# Patient Record
Sex: Female | Born: 1935
Health system: Southern US, Community
[De-identification: ages and names within clinical notes are randomized; demographics above are authoritative.]

## PROBLEM LIST (undated history)

## (undated) DIAGNOSIS — I2510 Atherosclerotic heart disease of native coronary artery without angina pectoris: Secondary | ICD-10-CM

## (undated) DIAGNOSIS — E119 Type 2 diabetes mellitus without complications: Secondary | ICD-10-CM

## (undated) DIAGNOSIS — I10 Essential (primary) hypertension: Secondary | ICD-10-CM

## (undated) DIAGNOSIS — K5730 Diverticulosis of large intestine without perforation or abscess without bleeding: Secondary | ICD-10-CM

## (undated) DIAGNOSIS — K639 Disease of intestine, unspecified: Secondary | ICD-10-CM

## (undated) DIAGNOSIS — K219 Gastro-esophageal reflux disease without esophagitis: Secondary | ICD-10-CM

## (undated) DIAGNOSIS — E785 Hyperlipidemia, unspecified: Secondary | ICD-10-CM

## (undated) DIAGNOSIS — D509 Iron deficiency anemia, unspecified: Secondary | ICD-10-CM

## (undated) DIAGNOSIS — IMO0001 Reserved for inherently not codable concepts without codable children: Secondary | ICD-10-CM

## (undated) DIAGNOSIS — F419 Anxiety disorder, unspecified: Secondary | ICD-10-CM

## (undated) DIAGNOSIS — Z8673 Personal history of transient ischemic attack (TIA), and cerebral infarction without residual deficits: Secondary | ICD-10-CM

## (undated) DIAGNOSIS — E669 Obesity, unspecified: Secondary | ICD-10-CM

## (undated) DIAGNOSIS — I639 Cerebral infarction, unspecified: Secondary | ICD-10-CM

## (undated) DIAGNOSIS — K449 Diaphragmatic hernia without obstruction or gangrene: Secondary | ICD-10-CM

## (undated) DIAGNOSIS — M1990 Unspecified osteoarthritis, unspecified site: Secondary | ICD-10-CM

## (undated) DIAGNOSIS — G3184 Mild cognitive impairment, so stated: Secondary | ICD-10-CM

## (undated) DIAGNOSIS — I350 Nonrheumatic aortic (valve) stenosis: Secondary | ICD-10-CM

## (undated) DIAGNOSIS — J189 Pneumonia, unspecified organism: Secondary | ICD-10-CM

## (undated) HISTORY — DX: Atherosclerotic heart disease of native coronary artery without angina pectoris: I25.10

## (undated) HISTORY — DX: Diaphragmatic hernia without obstruction or gangrene: K44.9

## (undated) HISTORY — DX: Type 2 diabetes mellitus without complications: E11.9

## (undated) HISTORY — DX: Iron deficiency anemia, unspecified: D50.9

## (undated) HISTORY — DX: Nonrheumatic aortic (valve) stenosis: I35.0

## (undated) HISTORY — DX: Gastro-esophageal reflux disease without esophagitis: K21.9

## (undated) HISTORY — PX: INSERT / REPLACE / REMOVE PACEMAKER: SUR710

## (undated) HISTORY — DX: Disease of intestine, unspecified: K63.9

## (undated) HISTORY — PX: BREAST LUMPECTOMY: SHX2

## (undated) HISTORY — DX: Obesity, unspecified: E66.9

## (undated) HISTORY — PX: HIP ARTHROPLASTY: SHX981

## (undated) HISTORY — DX: Anxiety disorder, unspecified: F41.9

## (undated) HISTORY — DX: Unspecified osteoarthritis, unspecified site: M19.90

## (undated) HISTORY — DX: Reserved for inherently not codable concepts without codable children: IMO0001

## (undated) HISTORY — DX: Diverticulosis of large intestine without perforation or abscess without bleeding: K57.30

## (undated) HISTORY — PX: TONSILLECTOMY: SUR1361

## (undated) HISTORY — DX: Hyperlipidemia, unspecified: E78.5

---
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
## 1993-09-18 HISTORY — PX: TOTAL HIP ARTHROPLASTY: SHX124

## 1999-09-19 HISTORY — PX: ANTERIOR CERVICAL DISCECTOMY: SHX1160

## 2000-08-29 ENCOUNTER — Ambulatory Visit (HOSPITAL_COMMUNITY): Admission: RE | Admit: 2000-08-29 | Discharge: 2000-08-29 | Payer: Self-pay | Admitting: Neurological Surgery

## 2000-08-29 ENCOUNTER — Encounter: Payer: Self-pay | Admitting: Neurological Surgery

## 2000-09-03 ENCOUNTER — Encounter: Payer: Self-pay | Admitting: Neurological Surgery

## 2000-09-04 ENCOUNTER — Inpatient Hospital Stay (HOSPITAL_COMMUNITY): Admission: RE | Admit: 2000-09-04 | Discharge: 2000-09-05 | Payer: Self-pay | Admitting: Neurological Surgery

## 2000-09-04 ENCOUNTER — Encounter: Payer: Self-pay | Admitting: Neurological Surgery

## 2000-09-27 ENCOUNTER — Encounter: Admission: RE | Admit: 2000-09-27 | Discharge: 2000-09-27 | Payer: Self-pay | Admitting: Neurological Surgery

## 2000-09-27 ENCOUNTER — Encounter: Payer: Self-pay | Admitting: Neurological Surgery

## 2001-01-24 ENCOUNTER — Ambulatory Visit (HOSPITAL_COMMUNITY): Admission: RE | Admit: 2001-01-24 | Discharge: 2001-01-24 | Payer: Self-pay | Admitting: Family Medicine

## 2001-01-24 ENCOUNTER — Encounter: Payer: Self-pay | Admitting: Family Medicine

## 2002-08-08 ENCOUNTER — Encounter: Payer: Self-pay | Admitting: Family Medicine

## 2002-08-08 ENCOUNTER — Ambulatory Visit (HOSPITAL_COMMUNITY): Admission: RE | Admit: 2002-08-08 | Discharge: 2002-08-08 | Payer: Self-pay | Admitting: Family Medicine

## 2002-08-15 ENCOUNTER — Encounter: Payer: Self-pay | Admitting: Family Medicine

## 2002-08-15 ENCOUNTER — Ambulatory Visit (HOSPITAL_COMMUNITY): Admission: RE | Admit: 2002-08-15 | Discharge: 2002-08-15 | Payer: Self-pay | Admitting: Family Medicine

## 2003-05-29 ENCOUNTER — Encounter: Payer: Self-pay | Admitting: Family Medicine

## 2003-05-29 ENCOUNTER — Ambulatory Visit (HOSPITAL_COMMUNITY): Admission: RE | Admit: 2003-05-29 | Discharge: 2003-05-29 | Payer: Self-pay | Admitting: Family Medicine

## 2003-08-03 ENCOUNTER — Other Ambulatory Visit: Admission: RE | Admit: 2003-08-03 | Discharge: 2003-08-03 | Payer: Self-pay | Admitting: Dermatology

## 2003-11-23 ENCOUNTER — Ambulatory Visit (HOSPITAL_COMMUNITY): Admission: RE | Admit: 2003-11-23 | Discharge: 2003-11-23 | Payer: Self-pay | Admitting: Urology

## 2004-04-28 ENCOUNTER — Ambulatory Visit (HOSPITAL_COMMUNITY): Admission: RE | Admit: 2004-04-28 | Discharge: 2004-04-28 | Payer: Self-pay | Admitting: Family Medicine

## 2004-05-12 ENCOUNTER — Inpatient Hospital Stay (HOSPITAL_BASED_OUTPATIENT_CLINIC_OR_DEPARTMENT_OTHER): Admission: RE | Admit: 2004-05-12 | Discharge: 2004-05-12 | Payer: Self-pay | Admitting: Cardiology

## 2004-09-18 HISTORY — PX: COLONOSCOPY: SHX174

## 2004-11-18 ENCOUNTER — Ambulatory Visit (HOSPITAL_COMMUNITY): Admission: RE | Admit: 2004-11-18 | Discharge: 2004-11-18 | Payer: Self-pay | Admitting: General Surgery

## 2005-09-18 HISTORY — PX: CATARACT EXTRACTION W/ INTRAOCULAR LENS IMPLANT: SHX1309

## 2006-06-14 ENCOUNTER — Ambulatory Visit (HOSPITAL_COMMUNITY): Admission: RE | Admit: 2006-06-14 | Discharge: 2006-06-14 | Payer: Self-pay | Admitting: Ophthalmology

## 2006-07-25 ENCOUNTER — Ambulatory Visit (HOSPITAL_COMMUNITY): Admission: RE | Admit: 2006-07-25 | Discharge: 2006-07-25 | Payer: Self-pay | Admitting: Ophthalmology

## 2008-09-17 ENCOUNTER — Ambulatory Visit: Payer: Self-pay | Admitting: Cardiology

## 2008-09-24 ENCOUNTER — Ambulatory Visit: Payer: Self-pay | Admitting: Cardiology

## 2008-09-24 ENCOUNTER — Encounter: Payer: Self-pay | Admitting: Cardiology

## 2008-09-24 ENCOUNTER — Ambulatory Visit (HOSPITAL_COMMUNITY): Admission: RE | Admit: 2008-09-24 | Discharge: 2008-09-24 | Payer: Self-pay | Admitting: Cardiology

## 2009-09-07 ENCOUNTER — Ambulatory Visit (HOSPITAL_COMMUNITY): Admission: RE | Admit: 2009-09-07 | Discharge: 2009-09-07 | Payer: Self-pay | Admitting: Family Medicine

## 2009-10-06 ENCOUNTER — Ambulatory Visit (HOSPITAL_COMMUNITY): Admission: RE | Admit: 2009-10-06 | Discharge: 2009-10-06 | Payer: Self-pay | Admitting: Family Medicine

## 2009-11-29 ENCOUNTER — Ambulatory Visit (HOSPITAL_COMMUNITY): Admission: RE | Admit: 2009-11-29 | Discharge: 2009-11-29 | Payer: Self-pay | Admitting: Family Medicine

## 2009-12-29 ENCOUNTER — Ambulatory Visit (HOSPITAL_COMMUNITY): Admission: RE | Admit: 2009-12-29 | Discharge: 2009-12-29 | Payer: Self-pay | Admitting: Family Medicine

## 2010-10-09 ENCOUNTER — Encounter: Payer: Self-pay | Admitting: Family Medicine

## 2011-01-31 NOTE — Letter (Signed)
September 17, 2008    Denise Parrish. Sudie Bailey, MD  [address]  [place], [place] [address]   RE:  Denise Parrish, Denise Parrish  MRN:  [id number]  /  DOB:  1923/06/26   Dear Brett Canales,   It was my pleasure evaluating Denise Parrish in the office today in  consultation at the request for chest discomfort.  As you know, this  nice woman has enjoyed generally good health.  She does have a number of  cardiovascular risk factors including diabetes, hypertension,  hyperlipidemia, but all have been well controlled with fairly modest  medication.  Over the past few weeks, she has noted chest discomfort and  palpitations.  She relates this to episodes of stress, which have  involved visiting family over the holidays.  She has trouble  characterizing her chest discomfort.  It is substernal and somewhat  annoying to her.  She was more troubled by regular strong palpitations  that she typically feels at night when she retires.  There has been no  dyspnea, lightheadedness, or syncope.  She did experience discomfort  with exertion or at rest.  There is no radiation of the discomfort.  There are no associated symptoms.   PAST MEDICAL HISTORY:  Otherwise notable for surgical procedures, a  surgical spine operation approximately 10 years ago and a total right  hip arthroplasty 14 years ago.   ALLERGIES:  She has no known drug allergies.   CURRENT MEDICATIONS:  1. Simvastatin 40 mg daily.  2. Aspirin 81 mg daily.  3. Metformin 1000 mg b.i.d.  4. Omeprazole 20 mg daily.  5. Calcium 500 mg daily.  6. Metoprolol 25 mg b.i.d.   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  She is divorced with 4 adult  children.   FAMILY HISTORY:  Father died with congestive heart failure and mother at  an advanced age due to cirrhosis.  Of three siblings, 2 were deceased  due to neoplastic disease.  One is alive and well.   REVIEW OF SYSTEMS:  Notable for the  need for corrective lenses,  bilateral cataract procedures, decreased hearing acuity, upper and lower  dentures, GERD symptoms, urinary frequency, diffuse arthritic  discomfort.  All other systems reviewed and are negative.   PHYSICAL EXAMINATION:  GENERAL:  A pleasant older woman in no acute  distress.  VITAL SIGNS:  The weight is 185, blood pressure 130/70, heart rate 65  and regular, and respirations 14.  NECK:  No jugular venous distention; normal carotid upstrokes without  bruits.  HEENT:  EOMs full; normal lids and conjunctivae; normal oral mucosa.  ENDOCRINE:  No thyromegaly.  HEMATOPOIETIC:  No adenopathy.  LUNGS:  Clear.  CARDIAC:  Distant first and second heart sounds.  ABDOMEN:  Soft and nontender; normal bowel sounds; no masses; no  organomegaly.  EXTREMITIES:  No edema; distal pulses intact.  NEUROLOGIC:  Symmetric strength and tone; normal cranial nerves.   EKG:  Normal sinus rhythm; borderline left atrial abnormality; otherwise  unremarkable.   Recent laboratories include a normal chemistry profile, normal hepatic  profile, normal CBC, hemoglobin A1c of 7.1 and a low vitamin D level of  36.   IMPRESSION:  Denise Parrish has somewhat atypical chest discomfort and  palpitations related to anxiety.  With minimal coronary artery disease 4  years ago, it is unlikely that she has developed significant coronary  artery disease with angina.  We will proceed with a stress  echocardiogram to rule out that possibility.  I suspect her chest  discomfort will resolve once her stress level has returned to baseline.  Please send her back to me if it does not.    Sincerely,     Gerrit Friends. Dietrich Pates, MD, [facility]  Electronically Signed   RMR/MedQ  DD: 09/17/2008  DT: 09/18/2008  Job #: [id number]

## 2011-02-03 NOTE — Op Note (Signed)
Denise Parrish, Denise Parrish            ACCOUNT NO.:  [id number]   MEDICAL RECORD NO.:  [id number]          PATIENT TYPE:  AMB   LOCATION:  DAY                           FACILITY:  [facility]   PHYSICIAN:  Susanne Greenhouse, MD       DATE OF BIRTH:  Jul 30, 1923   DATE OF PROCEDURE:  06/14/2006  DATE OF DISCHARGE:                                 OPERATIVE REPORT   PREOPERATIVE DIAGNOSIS:  Nuclear and cortical cataract, left eye.   POSTOPERATIVE DIAGNOSIS:  Nuclear and cortical cataract, left eye.   OPERATION/PROCEDURE:  1. Phacoemulsification.  2. Intraocular lens implantation, left eye.   SURGEON:  Susanne Greenhouse, M.D.   ANESTHESIA:  Topical with monitored anesthesia care.   DESCRIPTION OF PROCEDURE:  In the preoperative area, dilating drops and 2%  viscous lidocaine were placed into the left eye.  The patient was then  brought to the operating room where the eye was prepped and draped.  The  super sharp blade was used to make a paracentesis port at the surgeon's 2  o'clock position. The anterior chamber was filled with 1% nonpreserved  lidocaine solution and followed by Amvisc Plus.  A 2.85 mm keratome blade  was used to make a clear corneal incision at the superotemporal limbus.  A  cystitome needle and Utrata forceps was used to create a continuous tear  capsulotomy.  Hydrodissection was performed using balanced salt solution and  a fine cannula.  The lens nucleus was then removed using phacoemulsification  with a quadrant cracking technique.  Residual cortex was removed with  irrigation and aspiration.  Capsular bag and anterior chamber were refilled  with Amvisc Plus.  A posterior chamber intraocular lens was placed into the  capsular bag using a Monarch lens injecting system.  The Amvisc Plus was  then removed from the capsular bag and anterior chamber with irrigation and  aspiration.  Stromal hydration of the main incision and paracentesis ports  was performed with balanced salt solution  and a fine cannula.  The wound was  tested for a leak which was negative.  No sutures were placed.  There were  no operative complications.  The patient tolerated the procedure well and  was returned to the recovery area in satisfactory condition.  Prosthetic  device used was Alcon AcrySof posterior chamber intraocular lens model  SM60WF, power of 23, serial [id number].           ______________________________  Susanne Greenhouse, MD     KEH/MEDQ  D:  06/14/2006  T:  06/16/2006  Job:  [id number]

## 2011-02-03 NOTE — Op Note (Signed)
[place]  Patient:    Denise, Parrish                      MRN: [id number] Proc. Date: 09/04/00 Adm. Date:  [id number] Attending:  Jonne Ply                           Operative Report  PREOPERATIVE DIAGNOSIS:  C4-5 cervical spondylosis with right cervical radiculopathy.  POSTOPERATIVE DIAGNOSIS:  C4-5 cervical spondylosis with right cervical radiculopathy.  PROCEDURE:  Anterior cervical diskectomy and arthrodesis, C4-5, structural allograft, Synthes fixation.  SURGEON:  Stefani Dama, M.D.  FIRST ASSISTANT:  Cristi Loron, M.D.  ANESTHESIA:  General endotracheal.  INDICATIONS:  The patient is a 77 year old individual who has had significant right shoulder and right arm pain.  She had severe spondylosis with foraminal stenosis bilaterally at C4-5.  DESCRIPTION OF PROCEDURE:  The patient was brought to the operating room and placed on the table in the supine position.  After smooth induction of general endotracheal anesthesia, she was placed in five pounds of Holter traction. The neck was prepped with Duraprep and draped in a sterile fashion.  A transverse incision was made in the neck and carried down through the platysma.  The plane between the sternocleidomastoid and the strap muscles was dissected bluntly until the prevertebral space was reached.  The first identifiable disk space was noted to be that of C4-5.  After dissecting the longus colli muscle, a Caspar retractor was placed underneath the muscle, and the disk space was exposed.  A combination of curettes and rongeurs was used to remove a significant quantity of the markedly degenerated disk material from within the disk space.  Ventral osteophytes were removed with a Leksell rongeur.  As the disk space was evacuated, self-retaining disk spreader was placed on one side, and then the uncinate process spurs, which were noted to be quite big and overgrown, were  taken down with an Anspach and a 2.3 mm match-stick dissector.  The lateral recess was cleared first on the right side.  The common dural tube and the nerve root was decompressed well.  The left side was similarly decompressed.  Hemostasis from the epidural veins was obtained with bipolar cautery and small pledgets of Gelfoam soaked in thrombin, which were later removed.  Once dissection was completed, a 7 mm round fibular graft was packed with some of the osteophytic bone that was removed, and then this was placed into the interspace at C4-5 level.  Traction was removed.  A 16 mm Synthes plate was affixed with four locking 4 x 14 mm screws.  A radiograph for confirmation of placement of the plate and the bone graft was obtained.  The area was copiously irrigated with antibiotic irrigant solution, and then the platysma was closed with 3-0 Vicryl in interrupted fashion, and 3-0 Vicryl was used to close the skin subcuticularly.  The patient tolerated the procedure well.  Blood loss was less than 100 cc. DD:  09/04/00 TD:  09/05/00 Job: [id number]

## 2011-02-03 NOTE — Cardiovascular Report (Signed)
NAME:  Denise Parrish, Denise Parrish                      ACCOUNT NO.:  [id number]   MEDICAL RECORD NO.:  [id number]                   PATIENT TYPE:  OIB   LOCATION:  6501                                 FACILITY:  [facility]   PHYSICIAN:  Vida Roller, M.D.                DATE OF BIRTH:  03-01-1923   DATE OF PROCEDURE:  05/12/2004  DATE OF DISCHARGE:                              CARDIAC CATHETERIZATION   HISTORY OF PRESENT ILLNESS:  Ms. Brien is a 77 year old woman who is  preop for a hip replacement, who had a perfusion study which showed question  of balance ischemia with normal LV function, and she was referred for  elective heart catheterization.   DETAILS OF THE PROCEDURE:  After obtaining informed consent, the patient was  brought to the cardiac catheterization laboratory in the fasting state.  There she was prepped and draped in the usual sterile manner and a local  anesthetic was obtained over the right groin using 1% lidocaine without  epinephrine.  The right femoral artery was cannulated using the modified  Seldinger technique with a 5 French 10 cm sheath, and left heart  catheterization was performed using a 5 French Judkins left #4, a 5 French  Judkins right #4, and a 5 French pigtail catheter.  The pigtail catheter was  used for left ventriculography, which was imaged in the 30 degree RAO view.  At the conclusion of the procedure the catheters were removed, the patient  was moved back to the holding area, where the right femoral artery was  removed.  Hemostasis was obtained using direct manual pressure.  At the  conclusion of the hold there was no evidence of ecchymosis or hematoma  formation, and distal pulses were intact.  Total fluoroscopic time was 6.4  minutes.  Total iodinized contrast was 100 mL.   RESULTS:  1. Left ventricular pressure 121/16, end-diastolic pressure 27 mmHg.  2. Aortic pressure 130/67 with a mean of 91 mmHg.   SELECTIVE CORONARY ANGIOGRAPHY:  1. The  right coronary artery is a moderate-caliber, dominant vessel, which     has luminal irregularities.  The posterior descending is a small vessel     with luminal irregularities.  There was no significant obstruction of the     right coronary system.  2. The left main coronary artery is a large-caliber vessel which is     angiographically normal.  3. The left anterior descending coronary artery is a large-caliber vessel     with luminal irregularities in its proximal portion.  There are two     diagonals, which are of moderate caliber.  The entire LAD circulation is     free of obstructive disease.  4. Left circumflex coronary artery is a moderate-caliber vessel with luminal     irregularities in its proximal portion.  There are two very small obtuse     marginals, a large posterolateral branch, all of which are without  significant obstructive disease.  There was some tortuosity in the left     circumflex coronary artery, consistent with the patient's age.   Left ventriculography reveals normal left ventricular systolic function,  estimated at 65%, no wall motion abnormalities, 1+ MR.   ASSESSMENT:  Nonobstructive coronary disease, normal left ventricular  systolic function.   PLAN:  Send the lady to surgery without any further cardiovascular workup,  and she will need aggressive risk factor modification.                                               Vida Roller, M.D.    JH/MEDQ  D:  05/12/2004  T:  05/13/2004  Job:  [id number]   cc:   Mila Homer. Sudie Bailey, M.D.  [address] [place], [place] [address]  Fax: [phone]   Learta Codding, M.D. [facility]

## 2011-02-03 NOTE — H&P (Signed)
NAME:  Denise Parrish, Denise Parrish            ACCOUNT NO.:  [id number]   MEDICAL RECORD NO.:  [id number]          PATIENT TYPE:  AMB   LOCATION:                                 FACILITY:   PHYSICIAN:  Dalia Heading, M.D.  DATE OF BIRTH:  [birth year]   DATE OF ADMISSION:  DATE OF DISCHARGE:  LH                                HISTORY & PHYSICAL   CHIEF COMPLAINT:  Need for screening colonoscopy.   HISTORY OF PRESENT ILLNESS:  The patient is a 77 year old white female who  was referred for endoscopic evaluation.  She needs colonoscopy for screening  purposes.  No abdominal pain, weight loss, nausea, vomiting, diarrhea, constipation,  melena, or hematochezia have been noted.  She has never had a colonoscopy.  There is no family history of colon carcinoma.   PAST MEDICAL HISTORY:  For the most part, unremarkable.   PAST SURGICAL HISTORY:  1.  Hip replacement.  2.  Breast biopsy.  3.  Tonsillectomy.   CURRENT MEDICATIONS:  None.   ALLERGIES:  NO KNOWN DRUG ALLERGIES.   REVIEW OF SYSTEMS:  Noncontributory.   PHYSICAL EXAMINATION:  GENERAL:  The patient is a well-developed, well-  nourished white female in no acute distress.  VITAL SIGNS:  She is afebrile, and vital signs are stable.  LUNGS:  Clear to auscultation with equal breath sounds bilaterally.  HEART:  Examination reveals a regular rate and rhythm without S3, S4, or  murmurs.  ABDOMEN:  Soft, nontender, nondistended.  No hepatosplenomegaly or masses  noted.  RECTAL:  Deferred to the procedure.   IMPRESSION:  Need for screening colonoscopy.   PLAN:  The patient is scheduled for colonoscopy on November 18, 2004.  The risks  and benefits of the procedure, including bleeding and perforation, were  fully discussed with the patient who gave informed consent.      MAJ/MEDQ  D:  11/08/2004  T:  11/08/2004  Job:  [id number]   cc:   Mila Homer. Sudie Bailey, M.D.  [address] [place], [place] [address]  Fax: [phone]

## 2011-02-03 NOTE — Procedures (Signed)
NAMEAUDRYANNA, Denise Parrish                      ACCOUNT NO.:  [id number]   MEDICAL RECORD NO.:  [id number]                   PATIENT TYPE:  OUT   LOCATION:  RAD                                  FACILITY:  [facility]   PHYSICIAN:  [place]iance Bing, M.D.               DATE OF BIRTH:  Aug 11, 1923   DATE OF PROCEDURE:  04/28/2004  DATE OF DISCHARGE:                                  ECHOCARDIOGRAM   REFERRING PHYSICIAN:  1. Dr. Renard Matter.  2. Dr. Sudie Bailey.   CLINICAL DATA:  A 77 year old woman with irregular cardiac rhythm;  hip  replacement surgery anticipated.   M-mode:  Aorta 2.5, left atrium 3.9, septum 1.3, posterior wall 1.3, LV  diastole 4, LV systole 2.6.   RESULTS:  1. Technically somewhat difficult, but adequate echocardiographic study.  2. Mild left atrial enlargement;  normal right atrial size.  3. Normal right ventricular size and function;  mild RVH.  4. Aortic valve not ideally imaged;  the valve is sclerotic;  calcification     present in the wall of the proximal ascending aorta.  By Doppler, there     is trivial, in any, stenosis, and no insufficiency.  5. Normal tricuspid valve.  6. Normal mitral valve except for mild to moderate annular calcification.  7. Pulmonic valve not well seen, probably normal.  8. Normal internal dimension of the left ventricle;  mild concentric left     ventricular hypertrophy.  Regional global function are hyperdynamic.  9. Exuberate pericardial fat anterior to the heart.      ___________________________________________                                            [place] Bing, M.D.   RR/MEDQ  D:  04/29/2004  T:  04/29/2004  Job:  [id number]

## 2011-02-03 NOTE — Procedures (Signed)
NAMEGILBERTO, Denise Parrish            ACCOUNT NO.:  [id number]   MEDICAL RECORD NO.:  [id number]          PATIENT TYPE:  OUT   LOCATION:  RAD                           FACILITY:  [facility]   PHYSICIAN:  Jonelle Sidle, MD DATE OF BIRTH:  1923-05-31   DATE OF PROCEDURE:  DATE OF DISCHARGE:                                ECHOCARDIOGRAM   REQUESTING PHYSICIAN:  Gerrit Friends. Dietrich Pates, MD, [facility]   INDICATIONS:  Ms. Rossano is an 77 year old woman with a history of  type 2 diabetes mellitus, hypertension, and hyperlipidemia.  She has had  atypical chest discomfort as well as palpitations and is referred for  the assessment of ischemia.   RESTING DATA:  Blood pressure 132/78, heart rate 81 beats per minute.  12-lead electrocardiogram shows normal sinus rhythm at 71 beats per  minute with nonspecific T-wave changes.  The resting echocardiographic  images show vigorous baseline left ventricular systolic function at 70-  75% with small left ventricular chamber size.   STRESS DATA:  The patient was exercised on a standard Bruce protocol for  3 minutes and 45 seconds achieving a maximum heart rate of 135 beats per  minute which was 99% of the maximum predicted heart rate.  The patient  experienced shortness of breath and fatigue with exercise, but no chest  pain.  There were no clearly diagnostic ST-segment changes noted by  standard criteria and no sustained arrhythmias were noted.  Peak blood  pressure was 182/78 in early recovery.  Maximum workload was 4.6 METs.  The echocardiographic data following achievement of maximum predicted  heart rate reveals hyperdynamic left ventricular systolic function with  near cavity obliteration and no obvious inducible wall motion  abnormalities to indicate focal ischemia.   IMPRESSION:  1. No diagnostic ST-segment changes noted at a maximum workload of 4.6      METs and in the absence of chest pain.  There was a hypertensive      exercise response.  2. No  inducible wall motion abnormalities to indicate focal ischemia.      Baseline left ventricular systolic function is vigorous.      Jonelle Sidle, MD  Electronically Signed     SGM/MEDQ  D:  09/25/2008  T:  09/26/2008  Job:  [id number]   cc:   Mila Homer. Sudie Bailey, M.D.  Fax: [phone]

## 2011-03-07 ENCOUNTER — Other Ambulatory Visit (HOSPITAL_COMMUNITY): Payer: Self-pay | Admitting: Family Medicine

## 2011-03-15 ENCOUNTER — Ambulatory Visit (HOSPITAL_COMMUNITY)
Admission: RE | Admit: 2011-03-15 | Discharge: 2011-03-15 | Disposition: A | Discharge status: Home / Self Care | Payer: PRIVATE HEALTH INSURANCE | Source: Ambulatory Visit | Attending: Family Medicine | Admitting: Family Medicine

## 2011-03-15 DIAGNOSIS — M949 Disorder of cartilage, unspecified: Secondary | ICD-10-CM | POA: Insufficient documentation

## 2011-03-15 DIAGNOSIS — M899 Disorder of bone, unspecified: Secondary | ICD-10-CM | POA: Insufficient documentation

## 2011-08-01 ENCOUNTER — Encounter: Payer: Self-pay | Admitting: Cardiology

## 2011-10-02 ENCOUNTER — Emergency Department (HOSPITAL_COMMUNITY): Payer: PRIVATE HEALTH INSURANCE

## 2011-10-02 ENCOUNTER — Encounter (HOSPITAL_COMMUNITY): Payer: Self-pay

## 2011-10-02 ENCOUNTER — Emergency Department (HOSPITAL_COMMUNITY)
Admission: EM | Admit: 2011-10-02 | Discharge: 2011-10-02 | Disposition: A | Discharge status: Home / Self Care | Payer: PRIVATE HEALTH INSURANCE | Attending: Emergency Medicine | Admitting: Emergency Medicine

## 2011-10-02 ENCOUNTER — Other Ambulatory Visit: Payer: Self-pay

## 2011-10-02 DIAGNOSIS — E119 Type 2 diabetes mellitus without complications: Secondary | ICD-10-CM | POA: Insufficient documentation

## 2011-10-02 DIAGNOSIS — R197 Diarrhea, unspecified: Secondary | ICD-10-CM | POA: Insufficient documentation

## 2011-10-02 DIAGNOSIS — R011 Cardiac murmur, unspecified: Secondary | ICD-10-CM | POA: Insufficient documentation

## 2011-10-02 DIAGNOSIS — Z96649 Presence of unspecified artificial hip joint: Secondary | ICD-10-CM | POA: Insufficient documentation

## 2011-10-02 DIAGNOSIS — R112 Nausea with vomiting, unspecified: Secondary | ICD-10-CM | POA: Insufficient documentation

## 2011-10-02 DIAGNOSIS — K219 Gastro-esophageal reflux disease without esophagitis: Secondary | ICD-10-CM | POA: Insufficient documentation

## 2011-10-02 DIAGNOSIS — R002 Palpitations: Secondary | ICD-10-CM | POA: Insufficient documentation

## 2011-10-02 DIAGNOSIS — R42 Dizziness and giddiness: Secondary | ICD-10-CM | POA: Insufficient documentation

## 2011-10-02 DIAGNOSIS — R066 Hiccough: Secondary | ICD-10-CM | POA: Insufficient documentation

## 2011-10-02 DIAGNOSIS — I10 Essential (primary) hypertension: Secondary | ICD-10-CM | POA: Insufficient documentation

## 2011-10-02 DIAGNOSIS — R000 Tachycardia, unspecified: Secondary | ICD-10-CM | POA: Insufficient documentation

## 2011-10-02 DIAGNOSIS — M129 Arthropathy, unspecified: Secondary | ICD-10-CM | POA: Insufficient documentation

## 2011-10-02 HISTORY — DX: Essential (primary) hypertension: I10

## 2011-10-02 LAB — BASIC METABOLIC PANEL
BUN: 16 mg/dL (ref 6–23)
CO2: 25 mEq/L (ref 19–32)
Calcium: 9.7 mg/dL (ref 8.4–10.5)
Chloride: 102 mEq/L (ref 96–112)
Creatinine, Ser: 0.83 mg/dL (ref 0.50–1.10)
GFR calc Af Amer: 72 mL/min — ABNORMAL LOW (ref >90)
GFR calc non Af Amer: 62 mL/min — ABNORMAL LOW (ref >90)
Glucose, Bld: 117 mg/dL — ABNORMAL HIGH (ref 70–99)
Potassium: 4.5 mEq/L (ref 3.5–5.1)
Sodium: 136 mEq/L (ref 135–145)

## 2011-10-02 LAB — HEPATIC FUNCTION PANEL
ALT: 16 U/L (ref 0–35)
AST: 20 U/L (ref 0–37)
Albumin: 3.7 g/dL (ref 3.5–5.2)
Alkaline Phosphatase: 43 U/L (ref 39–117)
Bilirubin, Direct: 0.1 mg/dL (ref 0.0–0.3)
Indirect Bilirubin: 0.4 mg/dL (ref 0.3–0.9)
Total Bilirubin: 0.5 mg/dL (ref 0.3–1.2)
Total Protein: 6.9 g/dL (ref 6.0–8.3)

## 2011-10-02 LAB — POCT I-STAT TROPONIN I: Troponin i, poc: 0 ng/mL (ref 0.00–0.08)

## 2011-10-02 LAB — DIFFERENTIAL
Basophils Absolute: 0 10*3/uL (ref 0.0–0.1)
Basophils Relative: 0 % (ref 0–1)
Eosinophils Absolute: 0.2 10*3/uL (ref 0.0–0.7)
Eosinophils Relative: 3 % (ref 0–5)
Lymphocytes Relative: 26 % (ref 12–46)
Lymphs Abs: 1.9 10*3/uL (ref 0.7–4.0)
Monocytes Absolute: 0.7 10*3/uL (ref 0.1–1.0)
Monocytes Relative: 9 % (ref 3–12)
Neutro Abs: 4.5 10*3/uL (ref 1.7–7.7)
Neutrophils Relative %: 61 % (ref 43–77)

## 2011-10-02 LAB — CBC
HCT: 29.3 % — ABNORMAL LOW (ref 36.0–46.0)
Hemoglobin: 9.3 g/dL — ABNORMAL LOW (ref 12.0–15.0)
MCH: 25.6 pg — ABNORMAL LOW (ref 26.0–34.0)
MCHC: 31.7 g/dL (ref 30.0–36.0)
MCV: 80.7 fL (ref 78.0–100.0)
Platelets: 421 10*3/uL — ABNORMAL HIGH (ref 150–400)
RBC: 3.63 MIL/uL — ABNORMAL LOW (ref 3.87–5.11)
RDW: 16.3 % — ABNORMAL HIGH (ref 11.5–15.5)
WBC: 7.4 10*3/uL (ref 4.0–10.5)

## 2011-10-02 LAB — TROPONIN I: Troponin I: <0.3 ng/mL (ref <0.30)

## 2011-10-02 LAB — LIPASE, BLOOD: Lipase: 41 U/L (ref 11–59)

## 2011-10-02 MED ORDER — SODIUM CHLORIDE 0.9 % IV SOLN: Freq: Once | INTRAVENOUS | Status: DC

## 2011-10-02 MED ORDER — SODIUM CHLORIDE 0.9 % IV BOLUS (SEPSIS)
500.0000 mL | Freq: Once | INTRAVENOUS | Status: AC
  Administered 2011-10-02: 500 mL via INTRAVENOUS

## 2011-10-02 NOTE — ED Notes (Signed)
Pt's daughter reports that pt has been having episodes of hiccups for the past few months.

## 2011-10-02 NOTE — ED Notes (Signed)
Pt reports for the past couple of months pt would have a spell of vomiting about every 2 weeks.  Pt says usually lasts approx 24 hours.  PT says started vomiting Wednesday but says hasn't felt good since.  Pt says since Wednesday has had episodes of heart beating hard and gets lightheaded and diaphoretic.  PT went to Dr. Michelle Nasuti office and nurses sent her here for evaluation.

## 2011-10-02 NOTE — ED Notes (Signed)
Pt reports feeling light-headed and weak since Sunday.  Pt denies chest pain.  Pt states nausea that comes and goes.  Pt states she last vomited on Thursday night.

## 2011-10-02 NOTE — ED Provider Notes (Addendum)
History     CSN: [id number]  Arrival date & time 10/02/11  0940   First MD Initiated Contact with Patient 10/02/11 1055      No chief complaint on file.   (Consider location/radiation/quality/duration/timing/severity/associated sxs/prior treatment) HPI Denise Parrish is a 78 y.o. female who presents to the Emergency Department complaining of  Recurrent episodes of nausea and vomiting associated with a sensation of her heart beating fast. It is not associated with chest pain or shortness of breath. She began having these episodes just after Thanksgiving and they occur every two weeks. Each event lasts 2-3 days. They are often associated with hiccups. Today she is here with nausea and vomiting that began on Wednesday and lasted 24 hours. Since then she has felt lightheaded and has noticed her heart beating fast.   PCP Dr. Sudie Bailey  Past Medical History  Diagnosis Date  . Arthritis   . Diabetes mellitus   . Hypertension     is on metoprolol  . Acid reflux   . Heart murmur     Past Surgical History  Procedure Date  . Neck surgery   . Tonsillectomy   . Joint replacement     hip    No family history on file.  History  Substance Use Topics  . Smoking status: Never Smoker   . Smokeless tobacco: Not on file  . Alcohol Use: No    OB History    Grav Para Term Preterm Abortions TAB SAB Ect Mult Living                  Review of Systems 10 Systems reviewed and are negative for acute change except as noted in the HPI. Allergies  Review of patient's allergies indicates no known allergies.  Home Medications  No current outpatient prescriptions on file.  Ht 5\' 5"  (1.651 m)  Wt 182 lb (82.555 kg)  BMI 30.29 kg/m2  Physical Exam  Nursing note and vitals reviewed. Constitutional: She is oriented to person, place, and time. She appears well-developed and well-nourished. No distress.  HENT:  Head: Normocephalic.  Right Ear: External ear normal.  Left Ear: External  ear normal.  Nose: Nose normal.  Mouth/Throat: Oropharynx is clear and moist.  Eyes: Conjunctivae and EOM are normal. Pupils are equal, round, and reactive to light.  Neck: Normal range of motion. Neck supple.  Cardiovascular: Normal rate, normal heart sounds and intact distal pulses.  Exam reveals no gallop and no friction rub.   No murmur heard. Pulmonary/Chest: Effort normal and breath sounds normal.  Abdominal: Soft. Bowel sounds are normal.  Musculoskeletal: Normal range of motion.  Neurological: She is alert and oriented to person, place, and time. She has normal reflexes.  Skin: Skin is warm and dry.    ED Course  Procedures (including critical care time)  Results for orders placed during the hospital encounter of 10/02/11  CBC      Component Value Range   WBC 7.4  4.0 - 10.5 (K/uL)   RBC 3.63 (*) 3.87 - 5.11 (MIL/uL)   Hemoglobin 9.3 (*) 12.0 - 15.0 (g/dL)   HCT 28.4 (*) 13.2 - 46.0 (%)   MCV 80.7  78.0 - 100.0 (fL)   MCH 25.6 (*) 26.0 - 34.0 (pg)   MCHC 31.7  30.0 - 36.0 (g/dL)   RDW 44.0 (*) 10.2 - 15.5 (%)   Platelets 421 (*) 150 - 400 (K/uL)  DIFFERENTIAL      Component Value Range   Neutrophils  Relative 61  43 - 77 (%)   Neutro Abs 4.5  1.7 - 7.7 (K/uL)   Lymphocytes Relative 26  12 - 46 (%)   Lymphs Abs 1.9  0.7 - 4.0 (K/uL)   Monocytes Relative 9  3 - 12 (%)   Monocytes Absolute 0.7  0.1 - 1.0 (K/uL)   Eosinophils Relative 3  0 - 5 (%)   Eosinophils Absolute 0.2  0.0 - 0.7 (K/uL)   Basophils Relative 0  0 - 1 (%)   Basophils Absolute 0.0  0.0 - 0.1 (K/uL)  BASIC METABOLIC PANEL      Component Value Range   Sodium 136  135 - 145 (mEq/L)   Potassium 4.5  3.5 - 5.1 (mEq/L)   Chloride 102  96 - 112 (mEq/L)   CO2 25  19 - 32 (mEq/L)   Glucose, Bld 117 (*) 70 - 99 (mg/dL)   BUN 16  6 - 23 (mg/dL)   Creatinine, Ser 4.54  0.50 - 1.10 (mg/dL)   Calcium 9.7  8.4 - 09.8 (mg/dL)   GFR calc non Af Amer 62 (*) >90 (mL/min)   GFR calc Af Amer 72 (*) >90 (mL/min)    POCT I-STAT TROPONIN I      Component Value Range   Troponin i, poc 0.00  0.00 - 0.08 (ng/mL)   Comment 3           LIPASE, BLOOD      Component Value Range   Lipase 41  11 - 59 (U/L)  TROPONIN I      Component Value Range   Troponin I <0.30  <0.30 (ng/mL)  HEPATIC FUNCTION PANEL      Component Value Range   Total Protein 6.9  6.0 - 8.3 (g/dL)   Albumin 3.7  3.5 - 5.2 (g/dL)   AST 20  0 - 37 (U/L)   ALT 16  0 - 35 (U/L)   Alkaline Phosphatase 43  39 - 117 (U/L)   Total Bilirubin 0.5  0.3 - 1.2 (mg/dL)   Bilirubin, Direct 0.1  0.0 - 0.3 (mg/dL)   Indirect Bilirubin 0.4  0.3 - 0.9 (mg/dL)    Date: 11/91/4782 9562  Rate: 61  Rhythm: normal sinus rhythm  QRS Axis: normal  Intervals: normal  ST/T Wave abnormalities: normal  Conduction Disutrbances:none  Narrative Interpretation:   Old EKG Reviewed: unchanged c/w12/17/2001 Dg Chest Port 1 View  10/02/2011  *RADIOLOGY REPORT*  Clinical Data: Tachycardia, hiccups, vomiting, hypertension, heart murmur  PORTABLE CHEST - 1 VIEW  Comparison: Portable exam 1058 hours comparedto 12/29/2009  Findings: Upper normal heart size. Mediastinal contours and pulmonary vascularity normal. Minimal bronchitic changes. No pulmonary infiltrate, pleural effusion or pneumothorax. Bones demineralized. Scattered end plate spur formation thoracic spine.  IMPRESSION: Minimal bronchitic changes without acute infiltrate.  Original Report Authenticated By: Lollie Marrow, M.D.   Dg Abd 2 Views  10/02/2011  *RADIOLOGY REPORT*  Clinical Data: Diarrhea  ABDOMEN - 2 VIEW  Comparison: None  Findings: Nonobstructive bowel gas pattern. Scattered gas and stool throughout nondistended colon. No bowel dilatation or bowel wall thickening. Bones demineralized with multilevel degenerative disc disease changes of the thoracolumbar spine. No urinary tract calcifications. Numerous pelvic phleboliths. Right hip prosthesis.  IMPRESSION: Nonspecific bowel gas pattern.  Original Report  Authenticated By: Lollie Marrow, M.D.   [phone] Spoke with Dr. Sudie Bailey. He asked that patient be advised to come to his office. His intent is to teach her how to count a  pulse and have her keep a journal of these episodes. He may refer her to cardiology for holter monitoring.  MDM  Patient with recurrent nausea/vomiting episodes that are associated with palpitations and rapid heart beat. Patient in NSR, normal rate without ectopy. Labs are unremarkable. Xrays without acute findings.  Patient and daughter informed of clinical course, understand medical decision-making process, and agree with plan.Discussed possible holter monitor in the future. Pt stable in ED with no significant deterioration in condition.The patient appears reasonably screened and/or stabilized for discharge and I doubt any other medical condition or other [facility] requiring further screening, evaluation, or treatment in the ED at this time prior to discharge.  MDM Reviewed: nursing note and vitals Interpretation: labs, ECG and x-ray Consults: primary care provider           Nicoletta Dress. Colon Branch, MD 10/03/11 1610  Nicoletta Dress. Colon Branch, MD 10/19/11 2122

## 2011-10-04 ENCOUNTER — Encounter: Payer: Self-pay | Admitting: Cardiology

## 2011-10-04 ENCOUNTER — Other Ambulatory Visit (HOSPITAL_COMMUNITY): Payer: Self-pay | Admitting: Family Medicine

## 2011-10-04 DIAGNOSIS — R110 Nausea: Secondary | ICD-10-CM

## 2011-10-04 DIAGNOSIS — R1110 Vomiting, unspecified: Secondary | ICD-10-CM

## 2011-10-06 ENCOUNTER — Other Ambulatory Visit (HOSPITAL_COMMUNITY): Payer: Self-pay | Admitting: Family Medicine

## 2011-10-06 ENCOUNTER — Ambulatory Visit (HOSPITAL_COMMUNITY)
Admission: RE | Admit: 2011-10-06 | Discharge: 2011-10-06 | Disposition: A | Discharge status: Home / Self Care | Payer: PRIVATE HEALTH INSURANCE | Source: Ambulatory Visit | Attending: Family Medicine | Admitting: Family Medicine

## 2011-10-06 ENCOUNTER — Ambulatory Visit (HOSPITAL_COMMUNITY): Payer: PRIVATE HEALTH INSURANCE

## 2011-10-06 DIAGNOSIS — R110 Nausea: Secondary | ICD-10-CM

## 2011-10-06 DIAGNOSIS — R109 Unspecified abdominal pain: Secondary | ICD-10-CM | POA: Insufficient documentation

## 2011-10-06 DIAGNOSIS — R1110 Vomiting, unspecified: Secondary | ICD-10-CM

## 2011-10-06 DIAGNOSIS — K8020 Calculus of gallbladder without cholecystitis without obstruction: Secondary | ICD-10-CM | POA: Insufficient documentation

## 2011-10-06 DIAGNOSIS — R112 Nausea with vomiting, unspecified: Secondary | ICD-10-CM | POA: Insufficient documentation

## 2011-10-16 ENCOUNTER — Ambulatory Visit (INDEPENDENT_AMBULATORY_CARE_PROVIDER_SITE_OTHER): Payer: PRIVATE HEALTH INSURANCE | Admitting: Cardiology

## 2011-10-16 ENCOUNTER — Encounter: Payer: Self-pay | Admitting: Cardiology

## 2011-10-16 DIAGNOSIS — E785 Hyperlipidemia, unspecified: Secondary | ICD-10-CM

## 2011-10-16 DIAGNOSIS — Z7901 Long term (current) use of anticoagulants: Secondary | ICD-10-CM

## 2011-10-16 DIAGNOSIS — E669 Obesity, unspecified: Secondary | ICD-10-CM | POA: Insufficient documentation

## 2011-10-16 DIAGNOSIS — IMO0001 Reserved for inherently not codable concepts without codable children: Secondary | ICD-10-CM

## 2011-10-16 DIAGNOSIS — I10 Essential (primary) hypertension: Secondary | ICD-10-CM | POA: Insufficient documentation

## 2011-10-16 DIAGNOSIS — R002 Palpitations: Secondary | ICD-10-CM | POA: Insufficient documentation

## 2011-10-16 DIAGNOSIS — I700 Atherosclerosis of aorta: Secondary | ICD-10-CM

## 2011-10-16 DIAGNOSIS — E119 Type 2 diabetes mellitus without complications: Secondary | ICD-10-CM | POA: Insufficient documentation

## 2011-10-16 LAB — COMPREHENSIVE METABOLIC PANEL
ALT: 13 U/L (ref 0–35)
AST: 20 U/L (ref 0–37)
Albumin: 4.3 g/dL (ref 3.5–5.2)
Alkaline Phosphatase: 38 U/L — ABNORMAL LOW (ref 39–117)
BUN: 21 mg/dL (ref 6–23)
CO2: 23 mEq/L (ref 19–32)
Calcium: 9.3 mg/dL (ref 8.4–10.5)
Chloride: 107 mEq/L (ref 96–112)
Creat: 1.04 mg/dL (ref 0.50–1.10)
Glucose, Bld: 116 mg/dL — ABNORMAL HIGH (ref 70–99)
Potassium: 5.1 mEq/L (ref 3.5–5.3)
Sodium: 141 mEq/L (ref 135–145)
Total Bilirubin: 0.3 mg/dL (ref 0.3–1.2)
Total Protein: 6.8 g/dL (ref 6.0–8.3)

## 2011-10-16 NOTE — Assessment & Plan Note (Signed)
No lipid profile is available for review.

## 2011-10-16 NOTE — Assessment & Plan Note (Signed)
Since patient experiences symptoms every day, a 48 hour Holter recording will be performed as the initial study.

## 2011-10-16 NOTE — Assessment & Plan Note (Signed)
I doubt this aortic valve disease will ever progress to the point that it causes significant symptoms, but trajectory of disease will be determined by a repeat echocardiogram.

## 2011-10-16 NOTE — Patient Instructions (Signed)
Your physician has recommended that you wear a holter monitor. Holter monitors are medical devices that record the heart's electrical activity. Doctors most often use these monitors to diagnose arrhythmias. Arrhythmias are problems with the speed or rhythm of the heartbeat. The monitor is a small, portable device. You can wear one while you do your normal daily activities. This is usually used to diagnose what is causing palpitations/syncope (passing out).  Your physician has requested that you have an echocardiogram. Echocardiography is a painless test that uses sound waves to create images of your heart. It provides your doctor with information about the size and shape of your heart and how well your heart's chambers and valves are working. This procedure takes approximately one hour. There are no restrictions for this procedure.  Your physician recommends that you return for lab work in: today  Your physician recommends that you schedule a follow-up appointment in: 2 weeks

## 2011-10-16 NOTE — Progress Notes (Signed)
Patient ID: Denise Parrish, female   DOB: Nov 08, 1922, 78 y.o.   MRN: [id number] HPI: Denise Parrish returns to the office after a hiatus of 3 years for assessment of palpitations.  For the past few months, she has noted tachypalpitations that are more forceful than her usual heartbeat and typically occur at night when she suddenly awakens from sleep.  She does have palpitations at other times of the day as well as lightheadedness without truly feeling that she is about to lose her balance or lose consciousness, discomfort across the posterior aspects of both shoulders with radiation around to the chest, episodes of emesis typically lasting 24 hours or so.  If she has no history of GI problems, but did have a negative screening colonoscopy approximately 3 years ago.  Current Outpatient Prescriptions on File Prior to Visit  Medication Sig Dispense Refill  . alendronate (FOSAMAX) 70 MG tablet Take 70 mg by mouth every 7 (seven) days. Take with a full glass of water on an empty stomach. On Wednesdays      . aspirin EC 81 MG tablet Take 81 mg by mouth daily.      [name] ibuprofen (ADVIL,MOTRIN) 200 MG tablet Take 400-800 mg by mouth every 6 (six) hours as needed. For pain      . metFORMIN (GLUCOPHAGE) 500 MG tablet Take 500 mg by mouth 2 (two) times daily with a meal.      . naproxen (NAPROSYN) 500 MG tablet Take 500 mg by mouth 2 (two) times daily with a meal.      . omeprazole (PRILOSEC) 20 MG capsule Take 20 mg by mouth daily.      . solifenacin (VESICARE) 5 MG tablet Take 5 mg by mouth at bedtime.       No Known Allergies    Past Medical History  Diagnosis Date  . Degenerative joint disease     Right THA in 1995; cervical discectomy and fusion-2001  . Diabetes mellitus, type 2     + neuropathy  . Hypertension   . Gastroesophageal reflux disease   . Heart murmur     aortic sclerosis  . Emphysema   . Anxiety   . Sigmoid diverticulosis   . Hyperlipidemia   . Obesity   . Cholelithiasis   .  Osteoporosis   . Chest pain 2006    +palpitations; minimal CAD at cath in 2005; normal EF; negative stress echo in 09/2008     Past Surgical History  Procedure Date  . Anterior cervical discectomy 2001    C4-5, allograft, fixation  . Tonsillectomy   . Total hip arthroplasty 1995    Right  . Breast excisional biopsy   . Colonoscopy 2006  . Cataract extraction w/ intraocular lens implant 2007    Left     Family History  Problem Relation Age of Onset  . Cancer Brother     lung; + Sister-uterine  . Diabetes Mother     + brother  . Heart failure Father   . Hypertension Father     + brother     History   Social History  . Marital Status: Divorced    Spouse Name: N/A    Number of Children: 4  . Years of Education: N/A   Occupational History  . Retired     SNF   Social History Main Topics  . Smoking status: Former Smoker -- 1.0 packs/day for 30 years    Types: Cigarettes    Quit date: 10/15/1970  .  Smokeless tobacco: Former Neurosurgeon    Quit date: 09/19/1971  . Alcohol Use: No  . Drug Use: No  . Sexually Active: Not on file  ROS:      All other systems reviewed and are negative.  PHYSICAL EXAM: BP 138/59  Pulse 65  Ht 5' 4.25" (1.632 m)  Wt 76.204 kg (168 lb)  BMI 28.61 kg/m2  General-Well-developed; no acute distress Body Habitus-mildly overweight HEENT-[place]/AT; PERRL; EOM intact; conjunctiva and lids nl Neck-No JVD; no carotid bruits Endocrine-No thyromegaly Lungs-Clear lung fields; resonant percussion; normal I-to-E ratio Cardiovascular- normal PMI; normal S1 and decreased intensity of A2; grade 2/6 basilar systolic ejection murmur Abdomen-BS normal; firm and non-tender without masses or organomegaly Musculoskeletal-No deformities, cyanosis or clubbing Neurologic-Nl cranial nerves; symmetric strength and tone Skin- Warm, no significant lesions Extremities-Nl distal pulses; trace edema  EKG:  Normal sinus rhythm; minor technical difficulty; borderline left  atrial abnormality; nondiagnostic lateral Q waves; no tracing for comparison.   ASSESSMENT AND PLAN:  Eureka Bing, MD 10/16/2011 11:37 AM

## 2011-10-17 LAB — CBC WITH DIFFERENTIAL/PLATELET
Basophils Absolute: 0 10*3/uL (ref 0.0–0.1)
Basophils Relative: 0 % (ref 0–1)
Eosinophils Absolute: 0.4 10*3/uL (ref 0.0–0.7)
Eosinophils Relative: 4 % (ref 0–5)
HCT: 30.5 % — ABNORMAL LOW (ref 36.0–46.0)
Hemoglobin: 9 g/dL — ABNORMAL LOW (ref 12.0–15.0)
Lymphocytes Relative: 30 % (ref 12–46)
Lymphs Abs: 2.6 10*3/uL (ref 0.7–4.0)
MCH: 24.3 pg — ABNORMAL LOW (ref 26.0–34.0)
MCHC: 29.5 g/dL — ABNORMAL LOW (ref 30.0–36.0)
MCV: 82.4 fL (ref 78.0–100.0)
Monocytes Absolute: 0.8 10*3/uL (ref 0.1–1.0)
Monocytes Relative: 9 % (ref 3–12)
Neutro Abs: 4.9 10*3/uL (ref 1.7–7.7)
Neutrophils Relative %: 57 % (ref 43–77)
Platelets: 445 10*3/uL — ABNORMAL HIGH (ref 150–400)
RBC: 3.7 MIL/uL — ABNORMAL LOW (ref 3.87–5.11)
RDW: 16.9 % — ABNORMAL HIGH (ref 11.5–15.5)
WBC: 8.7 10*3/uL (ref 4.0–10.5)

## 2011-10-23 ENCOUNTER — Other Ambulatory Visit: Payer: Self-pay | Admitting: *Deleted

## 2011-10-23 DIAGNOSIS — D649 Anemia, unspecified: Secondary | ICD-10-CM

## 2011-10-24 ENCOUNTER — Ambulatory Visit (HOSPITAL_COMMUNITY)
Admission: RE | Admit: 2011-10-24 | Discharge: 2011-10-24 | Disposition: A | Discharge status: Home / Self Care | Payer: PRIVATE HEALTH INSURANCE | Source: Ambulatory Visit | Attending: Cardiology | Admitting: Cardiology

## 2011-10-24 DIAGNOSIS — I10 Essential (primary) hypertension: Secondary | ICD-10-CM | POA: Insufficient documentation

## 2011-10-24 DIAGNOSIS — R002 Palpitations: Secondary | ICD-10-CM

## 2011-10-24 DIAGNOSIS — I359 Nonrheumatic aortic valve disorder, unspecified: Secondary | ICD-10-CM

## 2011-10-24 DIAGNOSIS — E785 Hyperlipidemia, unspecified: Secondary | ICD-10-CM | POA: Insufficient documentation

## 2011-10-24 DIAGNOSIS — E119 Type 2 diabetes mellitus without complications: Secondary | ICD-10-CM | POA: Insufficient documentation

## 2011-10-24 NOTE — Progress Notes (Signed)
*  PRELIMINARY RESULTS* Echocardiogram 2D Echocardiogram has been performed.  Conrad Waggaman 10/24/2011, 10:25 AM

## 2011-10-24 NOTE — Progress Notes (Signed)
*  PRELIMINARY RESULTS* Echocardiogram 48H Holter monitor has been performed.  Conrad Bulverde 10/24/2011, 11:10 AM

## 2011-10-25 LAB — IRON AND TIBC
%SAT: 7 % — ABNORMAL LOW (ref 20–55)
Iron: 30 ug/dL — ABNORMAL LOW (ref 42–145)
TIBC: 451 ug/dL (ref 250–470)
UIBC: 421 ug/dL — ABNORMAL HIGH (ref 125–400)

## 2011-10-25 LAB — FERRITIN: Ferritin: 9 ng/mL — ABNORMAL LOW (ref 10–291)

## 2011-10-28 ENCOUNTER — Encounter: Payer: Self-pay | Admitting: Cardiology

## 2011-10-28 DIAGNOSIS — D509 Iron deficiency anemia, unspecified: Secondary | ICD-10-CM | POA: Insufficient documentation

## 2011-11-01 ENCOUNTER — Ambulatory Visit (INDEPENDENT_AMBULATORY_CARE_PROVIDER_SITE_OTHER): Payer: PRIVATE HEALTH INSURANCE | Admitting: Cardiology

## 2011-11-01 ENCOUNTER — Encounter: Payer: Self-pay | Admitting: Cardiology

## 2011-11-01 DIAGNOSIS — I700 Atherosclerosis of aorta: Secondary | ICD-10-CM

## 2011-11-01 DIAGNOSIS — D509 Iron deficiency anemia, unspecified: Secondary | ICD-10-CM

## 2011-11-01 DIAGNOSIS — R002 Palpitations: Secondary | ICD-10-CM

## 2011-11-01 DIAGNOSIS — IMO0001 Reserved for inherently not codable concepts without codable children: Secondary | ICD-10-CM

## 2011-11-01 DIAGNOSIS — I359 Nonrheumatic aortic valve disorder, unspecified: Secondary | ICD-10-CM

## 2011-11-01 DIAGNOSIS — Z7901 Long term (current) use of anticoagulants: Secondary | ICD-10-CM

## 2011-11-01 DIAGNOSIS — R0989 Other specified symptoms and signs involving the circulatory and respiratory systems: Secondary | ICD-10-CM

## 2011-11-01 DIAGNOSIS — I10 Essential (primary) hypertension: Secondary | ICD-10-CM

## 2011-11-01 MED ORDER — FERROUS SULFATE 325 (65 FE) MG PO TABS: 325.0000 mg | ORAL_TABLET | Freq: Every day | ORAL | Status: DC

## 2011-11-01 MED ORDER — FERROUS SULFATE 325 (65 FE) MG PO TABS: 325.0000 mg | ORAL_TABLET | Freq: Two times a day (BID) | ORAL | Status: DC

## 2011-11-01 NOTE — Assessment & Plan Note (Addendum)
Iron studies suggest that the etiology of anemia is iron deficiency.  Stool for Hemoccult testing is pending.  Iron supplementation prescribed, and patient referred to Dr. Karie Kirks for further assessment and treatment of anemia.

## 2011-11-01 NOTE — Assessment & Plan Note (Signed)
Exam is benign, and echocardiography verifies that aortic valve disease is very mild.  I doubt that this process will ever progress to the point that it causes symptoms.

## 2011-11-01 NOTE — Patient Instructions (Signed)
**Note De-Identified  Obfuscation** Your physician has recommended you make the following change in your medication: start taking Iron 325 mg twice daily with meals  Your physician has requested that you have a carotid duplex. This test is an ultrasound of the carotid arteries in your neck. It looks at blood flow through these arteries that supply the brain with blood. Allow one hour for this exam. There are no restrictions or special instructions.  Your physician recommends that you complete 3 hemoccult cards and return to this office  Your physician recommends that you schedule a follow-up appointment in: 3 months

## 2011-11-01 NOTE — Progress Notes (Signed)
Patient ID: Denise Parrish, female   DOB: Jan 31, 1923, 78 y.o.   MRN: [id number] HPI: Scheduled return visit for assessment of palpitations.  Since her last visit, she has done fairly well.  During the 48 hours she carried a Holter device, she experienced no symptoms, and no arrhythmias were identified.  Palpitations have generally improved due to a reduction in life stress, which she believes is the main cause of her symptoms.  Her son is an alcoholic and lives close to her.    Initial lab testing also reveals a moderate anemia, which she has not had recently as far she knows.  She denies any evidence of GI bleeding, and a colonoscopy was negative approximately 3 years ago.  Prior to Admission medications   Medication Sig Start Date End Date Taking? Authorizing Provider  alendronate (FOSAMAX) 70 MG tablet Take 70 mg by mouth every 7 (seven) days. Take with a full glass of water on an empty stomach. On Wednesdays   Yes Historical Provider, MD  aspirin EC 81 MG tablet Take 81 mg by mouth daily.   Yes Historical Provider, MD  ibuprofen (ADVIL,MOTRIN) 200 MG tablet Take 400-800 mg by mouth every 6 (six) hours as needed. For pain   Yes Historical Provider, MD  metFORMIN (GLUCOPHAGE) 500 MG tablet Take 500 mg by mouth 2 (two) times daily with a meal.   Yes Historical Provider, MD  metoprolol (LOPRESSOR) 50 MG tablet Take 50 mg by mouth 2 (two) times daily.   Yes Historical Provider, MD  naproxen (NAPROSYN) 500 MG tablet Take 500 mg by mouth 2 (two) times daily with a meal.   Yes Historical Provider, MD  omeprazole (PRILOSEC) 20 MG capsule Take 20 mg by mouth daily.   Yes Historical Provider, MD  solifenacin (VESICARE) 5 MG tablet Take 5 mg by mouth at bedtime.   Yes Historical Provider, MD  No Known Allergies    Past medical history, social history, and family history reviewed and updated.  ROS: See history of present illness  PHYSICAL EXAM: BP 124/57  Pulse 62  Ht 5' 4"  (1.626 m)  Wt 82.555 kg  (182 lb)  BMI 31.24 kg/m2 ; weight is stable her General-Well developed; no acute distress Body habitus-mildly overweight Neck-No JVD; right carotid bruit-does not have the same quality as her systolic murmur Lungs-clear lung fields; resonant to percussion Cardiovascular-normal PMI; normal S1 and S2; grade 1/6 basilar systolic ejection murmur Abdomen-normal bowel sounds; soft and non-tender without masses or organomegaly Musculoskeletal-No deformities, no cyanosis or clubbing Neurologic-Normal cranial nerves; symmetric strength and tone Skin-Warm, no significant lesions Extremities-distal pulses intact; no edema  ASSESSMENT AND PLAN:  Jacqulyn Ducking, MD 11/01/2011 1:22 PM

## 2011-11-01 NOTE — Assessment & Plan Note (Signed)
Palpitations are not of very much concern to the patient at present.  She was reassured that they are likely benign.  Unless troubling symptoms recur, no specific treatment will be needed.  Anemia may be contributing to her palpitations and should be addressed by Dr. Karie Kirks.  With normal intervals on her EKG and no bradycardia, she is a candidate for empiric treatment with beta blocker should she experience additional problems.

## 2011-11-01 NOTE — Assessment & Plan Note (Signed)
Blood pressure control is good.  Current medications will be continued.

## 2011-11-02 ENCOUNTER — Ambulatory Visit (HOSPITAL_COMMUNITY)
Admission: RE | Admit: 2011-11-02 | Discharge: 2011-11-02 | Disposition: A | Discharge status: Home / Self Care | Payer: PRIVATE HEALTH INSURANCE | Source: Ambulatory Visit | Attending: Cardiology | Admitting: Cardiology

## 2011-11-02 DIAGNOSIS — E119 Type 2 diabetes mellitus without complications: Secondary | ICD-10-CM | POA: Insufficient documentation

## 2011-11-02 DIAGNOSIS — R0989 Other specified symptoms and signs involving the circulatory and respiratory systems: Secondary | ICD-10-CM

## 2011-11-02 DIAGNOSIS — Z87891 Personal history of nicotine dependence: Secondary | ICD-10-CM | POA: Insufficient documentation

## 2011-11-06 ENCOUNTER — Encounter: Payer: Self-pay | Admitting: *Deleted

## 2012-01-11 ENCOUNTER — Encounter: Payer: Self-pay | Admitting: Urgent Care

## 2012-01-22 ENCOUNTER — Ambulatory Visit: Payer: PRIVATE HEALTH INSURANCE | Admitting: Gastroenterology

## 2012-01-30 ENCOUNTER — Ambulatory Visit: Payer: PRIVATE HEALTH INSURANCE | Admitting: Cardiology

## 2012-01-31 ENCOUNTER — Encounter: Payer: Self-pay | Admitting: Adult Health

## 2012-01-31 ENCOUNTER — Ambulatory Visit (INDEPENDENT_AMBULATORY_CARE_PROVIDER_SITE_OTHER): Payer: PRIVATE HEALTH INSURANCE | Admitting: Adult Health

## 2012-01-31 VITALS — BP 126/69 | HR 66 | Ht 64.0 in | Wt 178.0 lb

## 2012-01-31 DIAGNOSIS — I10 Essential (primary) hypertension: Secondary | ICD-10-CM

## 2012-01-31 DIAGNOSIS — IMO0001 Reserved for inherently not codable concepts without codable children: Secondary | ICD-10-CM

## 2012-01-31 DIAGNOSIS — I700 Atherosclerosis of aorta: Secondary | ICD-10-CM

## 2012-01-31 DIAGNOSIS — R0989 Other specified symptoms and signs involving the circulatory and respiratory systems: Secondary | ICD-10-CM

## 2012-01-31 DIAGNOSIS — R002 Palpitations: Secondary | ICD-10-CM

## 2012-01-31 NOTE — Assessment & Plan Note (Signed)
Echocardiogram completed demonstrated mild concentric hypertrophy, EF of 65%-70%. AoV mildly to moderately calcified annulus. Mild stenosis, Valve area of 1.44 cm2 (VTI) Valve area 1.33 cm2 by (Vmax).  She is asymptomatic. Continue evaluation every 3 years unless she becomes symptomatic.

## 2012-01-31 NOTE — Assessment & Plan Note (Addendum)
Well controlled at present. No changes in medications.

## 2012-01-31 NOTE — Assessment & Plan Note (Signed)
Carotid ultrasound completed 2.14.2013 demonstrated carotid bifurcation and proximal ICA plaque resulting in less than 50% diameter stenosis. Continued watch and wait. Repeat study in 2-3 years unless she is symptomatic. Recommend statin.

## 2012-01-31 NOTE — Progress Notes (Signed)
   HPI: Denise Parrish is a very pleasant 78 y/o patient of Dr. Lattie Haw we are following for ongoing assessment and treatment of palpitations,mild  AoV stenosis, hypertension and carotid artery disease. She comes today without cardiac complaints. Palpitations have been quiescent with use of metoprolol and reduced family stress. She has complaints of chronic arthritic pain and back pain.  Some of it radiating into her lower right abdomen. She plans to discuss this with Dr. Karie Kirks on follow-up appointment.  She is medically compliant.   No Known Allergies  Current Outpatient Prescriptions  Medication Sig Dispense Refill  . alendronate (FOSAMAX) 70 MG tablet Take 70 mg by mouth every 7 (seven) days. Take with a full glass of water on an empty stomach. On Wednesdays      . aspirin EC 81 MG tablet Take 81 mg by mouth daily.      [name] HYDROcodone-acetaminophen (NORCO) 5-325 MG per tablet Take 1 tablet by mouth 2 (two) times daily as needed.      . metFORMIN (GLUCOPHAGE) 500 MG tablet Take 500 mg by mouth 2 (two) times daily with a meal.      . metoprolol (LOPRESSOR) 50 MG tablet Take 50 mg by mouth 2 (two) times daily.      [name] omeprazole (PRILOSEC) 20 MG capsule Take 20 mg by mouth daily.        Past Medical History  Diagnosis Date  . Degenerative joint disease     Right THA in 1995; cervical discectomy and fusion-2001  . Diabetes mellitus, type 2     + neuropathy  . Hypertension   . Gastroesophageal reflux disease   . Aortic sclerosis     Long-standing heart murmur  . Emphysema   . Anxiety   . Sigmoid diverticulosis   . Hyperlipidemia   . Obesity   . Cholelithiasis   . Osteoporosis   . Chest pain 2006    +palpitations; minimal CAD at cath in 2005; normal EF; negative stress echo in 09/2008    Past Surgical History  Procedure Date  . Anterior cervical discectomy 2001    C4-5, allograft, fixation  . Tonsillectomy   . Total hip arthroplasty 1995    Right  . Breast excisional biopsy     . Colonoscopy 2006  . Cataract extraction w/ intraocular lens implant 2007    Left    BBC:WUGQBV of systems complete and found to be negative unless listed above  PHYSICAL EXAM BP 126/69  Pulse 66  Ht 5' 4"  (1.626 m)  Wt 178 lb (80.74 kg)  BMI 30.55 kg/m2  General: Well developed, well nourished, in no acute distress Head: Eyes PERRLA, No xanthomas.   Normal cephalic and atramatic  Lungs: Clear bilaterally to auscultation and percussion. Heart: HRRR S1 S2, 1/6 systolic murmur.  Pulses are 2+ & equal.            Right carotid bruit. No JVD.  No abdominal bruits. No femoral bruits. Abdomen: Bowel sounds are positive, abdomen soft and non-tender without masses or                  Hernia's noted. Msk:  Back normal, slow gait. Normal strength and tone for age. Extremities: No clubbing, cyanosis or edema.  DP +1 Neuro: Alert and oriented X 3. Psych:  Good affect, responds appropriately   ASSESSMENT AND PLAN

## 2012-01-31 NOTE — Assessment & Plan Note (Signed)
No further complaints of this with use of metoprolol. She is tolerating this medication without fatigue. Will continue this without dosage changes. She will be seen in one year.

## 2012-01-31 NOTE — Patient Instructions (Signed)
Your physician recommends that you schedule a follow-up appointment in: 1 year  

## 2012-03-14 ENCOUNTER — Ambulatory Visit (INDEPENDENT_AMBULATORY_CARE_PROVIDER_SITE_OTHER): Payer: PRIVATE HEALTH INSURANCE | Admitting: Gastroenterology

## 2012-03-14 ENCOUNTER — Encounter: Payer: Self-pay | Admitting: Gastroenterology

## 2012-03-14 ENCOUNTER — Other Ambulatory Visit: Payer: Self-pay | Admitting: Internal Medicine

## 2012-03-14 VITALS — BP 104/51 | HR 58 | Temp 97.2°F | Ht 65.0 in | Wt 179.8 lb

## 2012-03-14 DIAGNOSIS — R195 Other fecal abnormalities: Secondary | ICD-10-CM

## 2012-03-14 DIAGNOSIS — R197 Diarrhea, unspecified: Secondary | ICD-10-CM

## 2012-03-14 DIAGNOSIS — K219 Gastro-esophageal reflux disease without esophagitis: Secondary | ICD-10-CM

## 2012-03-14 DIAGNOSIS — K529 Noninfective gastroenteritis and colitis, unspecified: Secondary | ICD-10-CM

## 2012-03-14 DIAGNOSIS — K921 Melena: Secondary | ICD-10-CM

## 2012-03-14 DIAGNOSIS — D509 Iron deficiency anemia, unspecified: Secondary | ICD-10-CM

## 2012-03-14 LAB — CBC WITH DIFFERENTIAL/PLATELET
Basophils Absolute: 0 10*3/uL (ref 0.0–0.1)
Basophils Relative: 0 % (ref 0–1)
Eosinophils Absolute: 0.2 10*3/uL (ref 0.0–0.7)
Eosinophils Relative: 2 % (ref 0–5)
HCT: 30.8 % — ABNORMAL LOW (ref 36.0–46.0)
Hemoglobin: 9.8 g/dL — ABNORMAL LOW (ref 12.0–15.0)
Lymphocytes Relative: 30 % (ref 12–46)
Lymphs Abs: 2.7 10*3/uL (ref 0.7–4.0)
MCH: 24.8 pg — ABNORMAL LOW (ref 26.0–34.0)
MCHC: 31.8 g/dL (ref 30.0–36.0)
MCV: 78 fL (ref 78.0–100.0)
Monocytes Absolute: 0.8 10*3/uL (ref 0.1–1.0)
Monocytes Relative: 9 % (ref 3–12)
Neutro Abs: 5.4 10*3/uL (ref 1.7–7.7)
Neutrophils Relative %: 59 % (ref 43–77)
Platelets: 397 10*3/uL (ref 150–400)
RBC: 3.95 MIL/uL (ref 3.87–5.11)
RDW: 16.8 % — ABNORMAL HIGH (ref 11.5–15.5)
WBC: 9.2 10*3/uL (ref 4.0–10.5)

## 2012-03-14 MED ORDER — PEG 3350-KCL-NA BICARB-NACL 420 G PO SOLR: ORAL | Status: AC

## 2012-03-14 NOTE — Assessment & Plan Note (Signed)
IDA with heme positive stools. H/O chronic GERD, recent NSAID use, melena. Need to rule out UGI source. Agree with need for TCS as well. She has chronic diarrhea which may be secondary to DM or glucophage but need to consider microscopic colitis. Offered her EGD/TCS with random biopsies.  I have discussed the risks, alternatives, benefits with regards to but not limited to the risk of reaction to medication, bleeding, infection, perforation and the patient is agreeable to proceed. Written consent to be obtained.  Recheck CBC today. Continue omeprazole.   I feel celiac disease less likely etiology, but SB bx can be obtained at time of EGD if Dr. Gala Romney feels it is needed.

## 2012-03-14 NOTE — Progress Notes (Signed)
Primary Care Physician:  Robert Bellow, MD  Primary Gastroenterologist:  Garfield Cornea, MD   Chief Complaint  Patient presents with  . Colonoscopy    Heme pos stool/ cancelled appt in May / she was sick and it was bad weather also    HPI:  Denise Parrish is a 78 y.o. female here for further evaluation of IDA/heme+stool.   Chronic GERD on Prilosec for more than five years. Was having intermittent N/V back in April. Known cholelithiasis, since in her 29s. Abd u/s 09/2011 showed gallstones but no acute cholecystitis. No heartburn on Prilosec. Frequent diarrhea, but some regular stools. BM 3-4 times in a row but then none until the next day. Some days has normal BM. Occasional melena, no Pepto. No iron pills, "made me sick.". No dysphagia. No abdominal pain. No EGD. Remote TCS. Was on antiinflammatories but held once anemia discovered.   Current Outpatient Prescriptions  Medication Sig Dispense Refill  . alendronate (FOSAMAX) 70 MG tablet Take 70 mg by mouth every 7 (seven) days. Take with a full glass of water on an empty stomach. On Wednesdays      . aspirin EC 81 MG tablet Take 81 mg by mouth daily.      [name] HYDROcodone-acetaminophen (NORCO) 5-325 MG per tablet Take 1 tablet by mouth 2 (two) times daily as needed.      . metFORMIN (GLUCOPHAGE) 500 MG tablet Take 500 mg by mouth 2 (two) times daily with a meal.      . metoprolol (LOPRESSOR) 50 MG tablet Take 50 mg by mouth 2 (two) times daily.      [name] omeprazole (PRILOSEC) 20 MG capsule Take 20 mg by mouth daily.        Allergies as of 03/14/2012  . (No Known Allergies)    Past Medical History  Diagnosis Date  . Degenerative joint disease     Right THA in 1995; cervical discectomy and fusion-2001  . Diabetes mellitus, type 2     + neuropathy  . Hypertension   . Gastroesophageal reflux disease   . Aortic sclerosis     Long-standing heart murmur  . Emphysema   . Anxiety   . Sigmoid diverticulosis   . Hyperlipidemia   .  Obesity   . Cholelithiasis     on u/s 09/2011  . Osteoporosis   . Chest pain 2006    +palpitations; minimal CAD at cath in 2005; normal EF; negative stress echo in 09/2008  . IDA (iron deficiency anemia)     labs 09/2011    Past Surgical History  Procedure Date  . Anterior cervical discectomy 2001    C4-5, allograft, fixation  . Tonsillectomy   . Total hip arthroplasty 1995    Right  . Colonoscopy 2006  . Cataract extraction w/ intraocular lens implant 2007    Left  . Breast lumpectomy     benign    Family History  Problem Relation Age of Onset  . Cancer Brother     lung, age 80  . Diabetes Mother     + brother  . Heart failure Father   . Hypertension Father     + brother  . Colon cancer Neg Hx   . Uterine cancer Sister     History   Social History  . Marital Status: Divorced    Spouse Name: N/A    Number of Children: 90  . Years of Education: N/A   Occupational History  . Retired  SNF   Social History Main Topics  . Smoking status: Former Smoker -- 1.0 packs/day for 30 years    Types: Cigarettes    Quit date: 10/15/1970  . Smokeless tobacco: Former Systems developer    Quit date: 09/19/1971   Comment: Quit x 50 years  . Alcohol Use: No  . Drug Use: No  . Sexually Active: Not on file   Other Topics Concern  . Not on file   Social History Narrative  . No narrative on file      ROS:  General: Negative for anorexia, weight loss, fever, chills, fatigue, weakness. Eyes: Negative for vision changes.  ENT: Negative for hoarseness, difficulty swallowing , nasal congestion. CV: Negative for chest pain, angina, palpitations, dyspnea on exertion, peripheral edema.  Respiratory: Negative for dyspnea at rest, dyspnea on exertion, cough, sputum, wheezing.  GI: See history of present illness. GU:  Negative for dysuria, hematuria, urinary incontinence, urinary frequency, nocturnal urination.  MS: Chronic joint pain, low back pain.  Derm: Negative for rash or itching.    Neuro: Negative for weakness, abnormal sensation, seizure, frequent headaches, memory loss, confusion.  Psych: Negative for anxiety, depression, suicidal ideation, hallucinations.  Endo: Negative for unusual weight change.  Heme: Negative for bruising or bleeding. Allergy: Negative for rash or hives.    Physical Examination:  BP 104/51  Pulse 58  Temp 97.2 F (36.2 C) (Tympanic)  Ht 5' 5"  (1.651 m)  Wt 179 lb 12.8 oz (81.557 kg)  BMI 29.92 kg/m2   General: Well-nourished, well-developed in no acute distress. Appears younger than stated age.  Head: Normocephalic, atraumatic.   Eyes: Conjunctiva pale, no icterus. Mouth: Oropharyngeal mucosa moist and pink , no lesions erythema or exudate. Neck: Supple without thyromegaly, masses, or lymphadenopathy.  Lungs: Clear to auscultation bilaterally.  Heart: Regular rate and rhythm, no murmurs rubs or gallops.  Abdomen: Bowel sounds are normal, nontender, nondistended, no hepatosplenomegaly or masses, no abdominal bruits or    hernia , no rebound or guarding.   Rectal: not performed Extremities: No lower extremity edema. No clubbing or deformities.  Neuro: Alert and oriented x 4 , grossly normal neurologically.  Skin: Warm and dry, no rash or jaundice.   Psych: Alert and cooperative, normal mood and affect.  Labs: Lab Results  Component Value Date   WBC 8.7 10/16/2011   HGB 9.0* 10/16/2011   HCT 30.5* 10/16/2011   MCV 82.4 10/16/2011   PLT 445* 10/16/2011   Lab Results  Component Value Date   IRON 30* 10/24/2011   TIBC 451 10/24/2011   FERRITIN 9* 10/24/2011   Lab Results  Component Value Date   CREATININE 1.04 10/16/2011   BUN 21 10/16/2011   NA 141 10/16/2011   K 5.1 10/16/2011   CL 107 10/16/2011   CO2 23 10/16/2011   Lab Results  Component Value Date   ALT 13 10/16/2011   AST 20 10/16/2011   ALKPHOS 38* 10/16/2011   BILITOT 0.3 10/16/2011      Imaging Studies: No results found.

## 2012-03-14 NOTE — Progress Notes (Signed)
Faxed to PCP

## 2012-03-14 NOTE — Patient Instructions (Addendum)
We have scheduled you for a colonoscopy and upper endoscopy. Please see separate instructions.  Please have your blood work done.

## 2012-03-15 NOTE — Progress Notes (Signed)
Quick Note:  Stable. EGD/TCS as planned. ______

## 2012-03-19 ENCOUNTER — Encounter (HOSPITAL_COMMUNITY): Payer: Self-pay | Admitting: Pharmacy Technician

## 2012-04-03 ENCOUNTER — Encounter (HOSPITAL_COMMUNITY): Payer: Self-pay | Admitting: *Deleted

## 2012-04-03 ENCOUNTER — Ambulatory Visit (HOSPITAL_COMMUNITY)
Admission: RE | Admit: 2012-04-03 | Discharge: 2012-04-03 | Disposition: A | Discharge status: Home / Self Care | Payer: PRIVATE HEALTH INSURANCE | Source: Ambulatory Visit | Attending: Internal Medicine | Admitting: Internal Medicine

## 2012-04-03 ENCOUNTER — Encounter (HOSPITAL_COMMUNITY)
Admission: RE | Disposition: A | Discharge status: Home / Self Care | Payer: Self-pay | Source: Ambulatory Visit | Attending: Internal Medicine

## 2012-04-03 DIAGNOSIS — K921 Melena: Secondary | ICD-10-CM

## 2012-04-03 DIAGNOSIS — E785 Hyperlipidemia, unspecified: Secondary | ICD-10-CM | POA: Insufficient documentation

## 2012-04-03 DIAGNOSIS — K2940 Chronic atrophic gastritis without bleeding: Secondary | ICD-10-CM | POA: Insufficient documentation

## 2012-04-03 DIAGNOSIS — K5730 Diverticulosis of large intestine without perforation or abscess without bleeding: Secondary | ICD-10-CM

## 2012-04-03 DIAGNOSIS — K529 Noninfective gastroenteritis and colitis, unspecified: Secondary | ICD-10-CM

## 2012-04-03 DIAGNOSIS — E119 Type 2 diabetes mellitus without complications: Secondary | ICD-10-CM | POA: Insufficient documentation

## 2012-04-03 DIAGNOSIS — K449 Diaphragmatic hernia without obstruction or gangrene: Secondary | ICD-10-CM | POA: Insufficient documentation

## 2012-04-03 DIAGNOSIS — I10 Essential (primary) hypertension: Secondary | ICD-10-CM | POA: Insufficient documentation

## 2012-04-03 DIAGNOSIS — Z79899 Other long term (current) drug therapy: Secondary | ICD-10-CM | POA: Insufficient documentation

## 2012-04-03 DIAGNOSIS — K228 Other specified diseases of esophagus: Secondary | ICD-10-CM

## 2012-04-03 DIAGNOSIS — K219 Gastro-esophageal reflux disease without esophagitis: Secondary | ICD-10-CM

## 2012-04-03 DIAGNOSIS — D509 Iron deficiency anemia, unspecified: Secondary | ICD-10-CM

## 2012-04-03 DIAGNOSIS — R197 Diarrhea, unspecified: Secondary | ICD-10-CM | POA: Insufficient documentation

## 2012-04-03 DIAGNOSIS — K2289 Other specified disease of esophagus: Secondary | ICD-10-CM

## 2012-04-03 DIAGNOSIS — Z01812 Encounter for preprocedural laboratory examination: Secondary | ICD-10-CM | POA: Insufficient documentation

## 2012-04-03 HISTORY — PX: COLONOSCOPY: SHX174

## 2012-04-03 HISTORY — PX: ESOPHAGOGASTRODUODENOSCOPY: SHX1529

## 2012-04-03 LAB — GLUCOSE, CAPILLARY: Glucose-Capillary: 139 mg/dL — ABNORMAL HIGH (ref 70–99)

## 2012-04-03 SURGERY — COLONOSCOPY WITH ESOPHAGOGASTRODUODENOSCOPY (EGD)
Anesthesia: Moderate Sedation

## 2012-04-03 MED ORDER — MIDAZOLAM HCL 5 MG/5ML IJ SOLN
INTRAMUSCULAR | Status: DC | PRN
  Administered 2012-04-03: 2 mg via INTRAVENOUS
  Administered 2012-04-03: 1 mg via INTRAVENOUS
  Administered 2012-04-03: 2 mg via INTRAVENOUS

## 2012-04-03 MED ORDER — STERILE WATER FOR IRRIGATION IR SOLN
Status: DC | PRN
  Administered 2012-04-03: 10:00:00

## 2012-04-03 MED ORDER — BUTAMBEN-TETRACAINE-BENZOCAINE 2-2-14 % EX AERO
INHALATION_SPRAY | CUTANEOUS | Status: DC | PRN
  Administered 2012-04-03: 2 via TOPICAL

## 2012-04-03 MED ORDER — SODIUM CHLORIDE 0.45 % IV SOLN
Freq: Once | INTRAVENOUS | Status: AC
  Administered 2012-04-03: 1000 mL via INTRAVENOUS

## 2012-04-03 MED ORDER — MEPERIDINE HCL 100 MG/ML IJ SOLN
INTRAMUSCULAR | Status: AC
  Filled 2012-04-03: qty 2

## 2012-04-03 MED ORDER — MIDAZOLAM HCL 5 MG/5ML IJ SOLN
INTRAMUSCULAR | Status: AC
  Filled 2012-04-03: qty 10

## 2012-04-03 MED ORDER — MEPERIDINE HCL 100 MG/ML IJ SOLN
INTRAMUSCULAR | Status: DC | PRN
  Administered 2012-04-03 (×2): 25 mg via INTRAVENOUS

## 2012-04-03 NOTE — Op Note (Signed)
[facility] [address] [place], [place]  [address]  COLONOSCOPY PROCEDURE REPORT  PATIENT:  Denise Parrish, Denise Parrish  MR#:  [id number] BIRTHDATE:  Feb 14, 1923, 89 yrs. old  GENDER:  female ENDOSCOPIST:  R. Roetta Sessions, MD FACP [facility] REF. BY:  Gareth Morgan, M.D. PROCEDURE DATE:  04/03/2012 PROCEDURE:  Colonoscopy with segmental biopsy  INDICATIONS:  Iron deficiency anemia with chronic diarrhea  INFORMED CONSENT:  The risks, benefits, alternatives and imponderables including but not limited to bleeding, perforation as well as the possibility of a missed lesion have been reviewed. The potential for biopsy, lesion removal, etc. have also been discussed.  Questions have been answered.  All parties agreeable. Please see the history and physical in the medical record for more information.  MEDICATIONS:  Versed 5 mg IV and Demerol 50 mg IV in divided doses-see EGD report  DESCRIPTION OF PROCEDURE:  After a digital rectal exam was performed, the EG-2990i ([id number]) and EC-3890li ([id number]) colonoscope was advanced from the anus through the rectum and colon to the area of the cecum, ileocecal valve and appendiceal orifice.  The cecum was deeply intubated.  These structures were well-seen and photographed for the record.  From the level of the cecum and ileocecal valve, the scope was slowly and cautiously withdrawn.  The mucosal surfaces were carefully surveyed utilizing scope tip deflection to facilitate fold flattening as needed.  The scope was pulled down into the rectum where a thorough examination including retroflexion was performed. <<PROCEDUREIMAGES>>  FINDINGS: Adequate preparation. Normal rectum. Sigmoid diverticula; the remainder of the colonic mucosa appeared normal.  THERAPEUTIC / DIAGNOSTIC MANEUVERS PERFORMED: Biopsies of the ascending and sigmoid segments taken to rule out microscopic colitis  COMPLICATIONS:  None  CECAL WITHDRAWAL TIME: 9  minutes  IMPRESSION: Colonic diverticulosis-status post segmental biopsy  RECOMMENDATIONS: Followup on pathology. Further recommendations to follow.  ______________________________ R. Roetta Sessions, MD Caleen Essex  CC:  Gareth Morgan, M.D.  n. eSIGNED:   R. Roetta Sessions at 04/03/2012 10:25 AM  Jules Husbands, [phone]

## 2012-04-03 NOTE — Op Note (Signed)
[facility] [address] [place], [place]  [address]  ENDOSCOPY PROCEDURE REPORT  PATIENT:  Denise Parrish, Denise Parrish  MR#:  [id number] BIRTHDATE:  June 22, 1923, 89 yrs. old  GENDER:  female  ENDOSCOPIST:  R. Roetta Sessions, MD Caleen Essex Referred by:  Gareth Morgan, M.D.  PROCEDURE DATE:  04/03/2012 PROCEDURE:  EGD with esophageal, gastric and duodenal biopsy  INDICATIONS:   iron deficiency anemia. Chronic diarrhea. Recent melena  INFORMED CONSENT:   The risks, benefits, limitations, alternatives and imponderables have been discussed.  The potential for biopsy, esophogeal dilation, etc. have also been reviewed.  Questions have been answered.  All parties agreeable.  Please see the history and physical in the medical record for more information.  MEDICATIONS:   Versed 4 mg IV and Demerol 50 mg IV in divided doses. Cetacaine spray.  DESCRIPTION OF PROCEDURE:   The EG-2990i ([id number]) endoscope was introduced through the mouth and advanced to the second portion of the duodenum without difficulty or limitations.  The mucosal surfaces were surveyed very carefully during advancement of the scope and upon withdrawal.  Retroflexion view of the proximal stomach and esophagogastric junction was performed.  <<PROCEDUREIMAGES>>  FINDINGS: Accentuated  undulating Z line versus early Barrett's esophagus with salmon-colored epithelium coming up to 2 and half centimeters above the GE junction. Some neovascular changes. However, no mass lesion seen. No esophagitis. Stomach empty. Small hiatal hernia. Minimal patchy erythema of the gastric mucosa. No ulcer or infiltrating process. Pylorus patent. Examination first and second portion of the duodenum revealed no abnormalities.  THERAPEUTIC / DIAGNOSTIC MANEUVERS PERFORMED:  Biopsies of the duodenum, stomach and abnormal-appearing distal esophagus taken for histologic study.  COMPLICATIONS:   None  IMPRESSION: Query short segment  Barrett's esophagus - status post biopsy. Small hiatal hernia. Minimally abnormal-appearing gastric of                               uncertain significance-status post biopsy. Status post duodenal biopsy.  RECOMMENDATIONS: Follow up on pathology. See colonoscopy report.  ______________________________ R. Roetta Sessions, MD Caleen Essex  CC:  n. eSIGNED:   R. Roetta Sessions at 04/03/2012 10:05 AM  Jules Husbands, [phone]

## 2012-04-03 NOTE — H&P (View-Only) (Signed)
Primary Care Physician:  KNOWLTON,STEPHEN D, MD  Primary Gastroenterologist:  Michael Rourk, MD   Chief Complaint  Patient presents with  . Colonoscopy    Heme pos stool/ cancelled appt in May / she was sick and it was bad weather also    HPI:  Denise Parrish is a 78 y.o. female here for further evaluation of IDA/heme+stool.   Chronic GERD on Prilosec for more than five years. Was having intermittent N/V back in April. Known cholelithiasis, since in her 60s. Abd u/s 09/2011 showed gallstones but no acute cholecystitis. No heartburn on Prilosec. Frequent diarrhea, but some regular stools. BM 3-4 times in a row but then none until the next day. Some days has normal BM. Occasional melena, no Pepto. No iron pills, "made me sick.". No dysphagia. No abdominal pain. No EGD. Remote TCS. Was on antiinflammatories but held once anemia discovered.   Current Outpatient Prescriptions  Medication Sig Dispense Refill  . alendronate (FOSAMAX) 70 MG tablet Take 70 mg by mouth every 7 (seven) days. Take with a full glass of water on an empty stomach. On Wednesdays      . aspirin EC 81 MG tablet Take 81 mg by mouth daily.      . HYDROcodone-acetaminophen (NORCO) 5-325 MG per tablet Take 1 tablet by mouth 2 (two) times daily as needed.      . metFORMIN (GLUCOPHAGE) 500 MG tablet Take 500 mg by mouth 2 (two) times daily with a meal.      . metoprolol (LOPRESSOR) 50 MG tablet Take 50 mg by mouth 2 (two) times daily.      . omeprazole (PRILOSEC) 20 MG capsule Take 20 mg by mouth daily.        Allergies as of 03/14/2012  . (No Known Allergies)    Past Medical History  Diagnosis Date  . Degenerative joint disease     Right THA in 1995; cervical discectomy and fusion-2001  . Diabetes mellitus, type 2     + neuropathy  . Hypertension   . Gastroesophageal reflux disease   . Aortic sclerosis     Long-standing heart murmur  . Emphysema   . Anxiety   . Sigmoid diverticulosis   . Hyperlipidemia   .  Obesity   . Cholelithiasis     on u/s 09/2011  . Osteoporosis   . Chest pain 2006    +palpitations; minimal CAD at cath in 2005; normal EF; negative stress echo in 09/2008  . IDA (iron deficiency anemia)     labs 09/2011    Past Surgical History  Procedure Date  . Anterior cervical discectomy 2001    C4-5, allograft, fixation  . Tonsillectomy   . Total hip arthroplasty 1995    Right  . Colonoscopy 2006  . Cataract extraction w/ intraocular lens implant 2007    Left  . Breast lumpectomy     benign    Family History  Problem Relation Age of Onset  . Cancer Brother     lung, age 91  . Diabetes Mother     + brother  . Heart failure Father   . Hypertension Father     + brother  . Colon cancer Neg Hx   . Uterine cancer Sister     History   Social History  . Marital Status: Divorced    Spouse Name: N/A    Number of Children: 4  . Years of Education: N/A   Occupational History  . Retired       SNF   Social History Main Topics  . Smoking status: Former Smoker -- 1.0 packs/day for 30 years    Types: Cigarettes    Quit date: 10/15/1970  . Smokeless tobacco: Former User    Quit date: 09/19/1971   Comment: Quit x 50 years  . Alcohol Use: No  . Drug Use: No  . Sexually Active: Not on file   Other Topics Concern  . Not on file   Social History Narrative  . No narrative on file      ROS:  General: Negative for anorexia, weight loss, fever, chills, fatigue, weakness. Eyes: Negative for vision changes.  ENT: Negative for hoarseness, difficulty swallowing , nasal congestion. CV: Negative for chest pain, angina, palpitations, dyspnea on exertion, peripheral edema.  Respiratory: Negative for dyspnea at rest, dyspnea on exertion, cough, sputum, wheezing.  GI: See history of present illness. GU:  Negative for dysuria, hematuria, urinary incontinence, urinary frequency, nocturnal urination.  MS: Chronic joint pain, low back pain.  Derm: Negative for rash or itching.    Neuro: Negative for weakness, abnormal sensation, seizure, frequent headaches, memory loss, confusion.  Psych: Negative for anxiety, depression, suicidal ideation, hallucinations.  Endo: Negative for unusual weight change.  Heme: Negative for bruising or bleeding. Allergy: Negative for rash or hives.    Physical Examination:  BP 104/51  Pulse 58  Temp 97.2 F (36.2 C) (Tympanic)  Ht 5' 5" (1.651 m)  Wt 179 lb 12.8 oz (81.557 kg)  BMI 29.92 kg/m2   General: Well-nourished, well-developed in no acute distress. Appears younger than stated age.  Head: Normocephalic, atraumatic.   Eyes: Conjunctiva pale, no icterus. Mouth: Oropharyngeal mucosa moist and pink , no lesions erythema or exudate. Neck: Supple without thyromegaly, masses, or lymphadenopathy.  Lungs: Clear to auscultation bilaterally.  Heart: Regular rate and rhythm, no murmurs rubs or gallops.  Abdomen: Bowel sounds are normal, nontender, nondistended, no hepatosplenomegaly or masses, no abdominal bruits or    hernia , no rebound or guarding.   Rectal: not performed Extremities: No lower extremity edema. No clubbing or deformities.  Neuro: Alert and oriented x 4 , grossly normal neurologically.  Skin: Warm and dry, no rash or jaundice.   Psych: Alert and cooperative, normal mood and affect.  Labs: Lab Results  Component Value Date   WBC 8.7 10/16/2011   HGB 9.0* 10/16/2011   HCT 30.5* 10/16/2011   MCV 82.4 10/16/2011   PLT 445* 10/16/2011   Lab Results  Component Value Date   IRON 30* 10/24/2011   TIBC 451 10/24/2011   FERRITIN 9* 10/24/2011   Lab Results  Component Value Date   CREATININE 1.04 10/16/2011   BUN 21 10/16/2011   NA 141 10/16/2011   K 5.1 10/16/2011   CL 107 10/16/2011   CO2 23 10/16/2011   Lab Results  Component Value Date   ALT 13 10/16/2011   AST 20 10/16/2011   ALKPHOS 38* 10/16/2011   BILITOT 0.3 10/16/2011      Imaging Studies: No results found.    

## 2012-04-03 NOTE — Interval H&P Note (Signed)
History and Physical Interval Note:  04/03/2012 9:39 AM  Denise Parrish  has presented today for surgery, with the diagnosis of IDA, chronic diarrrhea, blood in stool, chronic GERD  The various methods of treatment have been discussed with the patient and family. After consideration of risks, benefits and other options for treatment, the patient has consented to  Procedure(s) (LRB): COLONOSCOPY WITH ESOPHAGOGASTRODUODENOSCOPY (EGD) (N/A) as a surgical intervention .  The patient's history has been reviewed, patient examined, no change in status, stable for surgery.  I have reviewed the patients' chart and labs.  Questions were answered to the patient's satisfaction.     Eula Listen

## 2012-04-05 ENCOUNTER — Encounter: Payer: Self-pay | Admitting: Internal Medicine

## 2012-04-05 ENCOUNTER — Encounter: Payer: Self-pay | Admitting: *Deleted

## 2012-05-03 ENCOUNTER — Ambulatory Visit (INDEPENDENT_AMBULATORY_CARE_PROVIDER_SITE_OTHER): Payer: PRIVATE HEALTH INSURANCE | Admitting: Internal Medicine

## 2012-05-03 ENCOUNTER — Encounter: Payer: Self-pay | Admitting: Internal Medicine

## 2012-05-03 VITALS — BP 103/66 | HR 66 | Temp 97.6°F | Ht 65.5 in | Wt 177.8 lb

## 2012-05-03 DIAGNOSIS — D509 Iron deficiency anemia, unspecified: Secondary | ICD-10-CM

## 2012-05-03 DIAGNOSIS — R197 Diarrhea, unspecified: Secondary | ICD-10-CM

## 2012-05-03 DIAGNOSIS — R195 Other fecal abnormalities: Secondary | ICD-10-CM

## 2012-05-03 NOTE — Patient Instructions (Addendum)
Stop Prilosec x 2 weeks; try Dexilant 60 mg daily x 2 weeks to see if diarrhea gets better  Schedule a capsule study to evaluate IDA and hemocult positive stool (negative EGD and TCS)  Also, let's do a stool sample for culture, Giardia and C. difficile

## 2012-05-03 NOTE — Progress Notes (Signed)
Primary Care Physician:  Milana Obey, MD Primary Gastroenterologist:  Dr. Jena Gauss  Pre-Procedure History & Physical: HPI:  Denise Parrish is a 78 y.o. female here for iron deficiency anemia, Hemoccult positive stool and diarrhea. Recent EGD and colonoscopy demonstrated non-H. pyloric gastritis on biopsy colon biopsies demonstrated no evidence of microscopic colitis. Also, the endoscopic evaluation failed to yield a cause of iron deficiency anemia. Patient has not had any melena or or hematochezia. She does have nonbloody diarrhea which scanning to be a problem for this time. Has been on Prilosec for years and feels it is needed for him controlled acid reflux symptoms. Duodenal biopsies negative for celiac disease as well. From June of this year H&H 9.8 and 30.8 with MCV of 78.0 these values actually improved over January of this year (9.0 and 30.5).  Past Medical History  Diagnosis Date  . Degenerative joint disease     Right THA in 1995; cervical discectomy and fusion-2001  . Diabetes mellitus, type 2     + neuropathy  . Hypertension   . Gastroesophageal reflux disease   . Aortic sclerosis     Long-standing heart murmur  . Emphysema   . Anxiety   . Sigmoid diverticulosis   . Hyperlipidemia   . Obesity   . Cholelithiasis     on u/s 09/2011  . Osteoporosis   . Chest pain 2006    +palpitations; minimal CAD at cath in 2005; normal EF; negative stress echo in 09/2008  . IDA (iron deficiency anemia)     labs 09/2011  . Hiatal hernia     Past Surgical History  Procedure Date  . Anterior cervical discectomy 2001    C4-5, allograft, fixation  . Tonsillectomy   . Total hip arthroplasty 1995    Right  . Colonoscopy 2006  . Cataract extraction w/ intraocular lens implant 2007    Left  . Breast lumpectomy     benign  . Colonoscopy 04/03/12    Dr. Merlyn Lot diverticulosis  . Esophagogastroduodenoscopy 04/03/12    Dr. Dot Been hernia, chronic gastritis on bx    Prior to  Admission medications   Medication Sig Start Date End Date Taking? Authorizing Provider  alendronate (FOSAMAX) 70 MG tablet Take 70 mg by mouth every 7 (seven) days. Take with a full glass of water on an empty stomach. On Wednesdays   Yes Historical Provider, MD  aspirin EC 81 MG tablet Take 81 mg by mouth daily.   Yes Historical Provider, MD  HYDROcodone-acetaminophen (NORCO) 5-325 MG per tablet Take 1 tablet by mouth 2 (two) times daily as needed. pain   Yes Historical Provider, MD  loperamide (IMODIUM A-D) 2 MG tablet Take 2 mg by mouth as needed.   Yes Historical Provider, MD  metFORMIN (GLUCOPHAGE) 500 MG tablet Take 500 mg by mouth 2 (two) times daily with a meal.   Yes Historical Provider, MD  metoprolol (LOPRESSOR) 50 MG tablet Take 50 mg by mouth 2 (two) times daily.   Yes Historical Provider, MD  omeprazole (PRILOSEC) 20 MG capsule Take 20 mg by mouth daily.   Yes Historical Provider, MD    Allergies as of 05/03/2012  . (No Known Allergies)    Family History  Problem Relation Age of Onset  . Cancer Brother     lung, age 33  . Diabetes Mother     + brother  . Heart failure Father   . Hypertension Father     + brother  . Colon cancer  Neg Hx   . Uterine cancer Sister     History   Social History  . Marital Status: Divorced    Spouse Name: N/A    Number of Children: 4  . Years of Education: N/A   Occupational History  . Retired     SNF   Social History Main Topics  . Smoking status: Former Smoker -- 1.0 packs/day for 30 years    Types: Cigarettes    Quit date: 10/15/1970  . Smokeless tobacco: Former Neurosurgeon    Quit date: 09/19/1971   Comment: Quit x 50 years  . Alcohol Use: No  . Drug Use: No  . Sexually Active: Not on file   Other Topics Concern  . Not on file   Social History Narrative  . No narrative on file    Review of Systems: See HPI, otherwise negative ROS  Physical Exam: BP 103/66  Pulse 66  Temp 97.6 F (36.4 C) (Temporal)  Ht 5' 5.5"  (1.664 m)  Wt 177 lb 12.8 oz (80.65 kg)  BMI 29.14 kg/m2 General:   Alert, elderly , pleasant and cooperative in NAD Skin:  Intact without significant lesions or rashes. Eyes:  Sclera clear, no icterus.   Conjunctiva pink. Ears:  Normal auditory acuity. Nose:  No deformity, discharge,  or lesions. Mouth:  No deformity or lesions. Neck:  Supple; no masses or thyromegaly. No significant cervical adenopathy. Lungs:  Clear throughout to auscultation.   No wheezes, crackles, or rhonchi. No acute distress. Heart:  Regular rate and rhythm; no murmurs, clicks, rubs,  or gallops. Abdomen: Obese. Non-distended, normal bowel sounds.  Soft and nontender without appreciable mass or hepatosplenomegaly.  Pulses:  Normal pulses noted. Extremities:  Without clubbing or edema.  Impression/Plan:  78 year old lady with iron deficiency anemia, occult blood positive stool. Essentially negative EGD and colonoscopy. Patient needs a capsule study were small bowel complete the GI evaluation. Diarrhea has been more her problem lately for this nice lady. No evidence of celiac disease. I wonder Prilosec might have something to do with iron deficiency via a mechanism of malabsorption of iron and abdominal pain.  Recommendations: Proceed with a capsule study the small bowel to complete her GI evaluation for Hemoccult-positive stool. Cold Prilosec for 2 weeks. Tried Dexilant 60 mg orally daily. Samples provided. Send a set of stool studies which appeared to not have been done yet. Further recommendations to follow.

## 2012-05-06 ENCOUNTER — Encounter (HOSPITAL_COMMUNITY): Payer: Self-pay | Admitting: Pharmacy Technician

## 2012-05-09 ENCOUNTER — Encounter (HOSPITAL_COMMUNITY)
Admission: RE | Disposition: A | Discharge status: Home / Self Care | Payer: Self-pay | Source: Ambulatory Visit | Attending: Internal Medicine

## 2012-05-09 ENCOUNTER — Ambulatory Visit (HOSPITAL_COMMUNITY)
Admission: RE | Admit: 2012-05-09 | Discharge: 2012-05-09 | Disposition: A | Discharge status: Home / Self Care | Payer: PRIVATE HEALTH INSURANCE | Source: Ambulatory Visit | Attending: Internal Medicine | Admitting: Internal Medicine

## 2012-05-09 ENCOUNTER — Encounter (HOSPITAL_COMMUNITY): Payer: Self-pay

## 2012-05-09 DIAGNOSIS — K921 Melena: Secondary | ICD-10-CM | POA: Insufficient documentation

## 2012-05-09 DIAGNOSIS — R197 Diarrhea, unspecified: Secondary | ICD-10-CM | POA: Insufficient documentation

## 2012-05-09 HISTORY — PX: GIVENS CAPSULE STUDY: SHX5432

## 2012-05-09 SURGERY — IMAGING PROCEDURE, GI TRACT, INTRALUMINAL, VIA CAPSULE

## 2012-05-13 ENCOUNTER — Encounter (HOSPITAL_COMMUNITY): Payer: Self-pay | Admitting: Internal Medicine

## 2012-05-16 ENCOUNTER — Telehealth: Payer: Self-pay | Admitting: Gastroenterology

## 2012-05-16 DIAGNOSIS — R933 Abnormal findings on diagnostic imaging of other parts of digestive tract: Secondary | ICD-10-CM

## 2012-05-16 DIAGNOSIS — D509 Iron deficiency anemia, unspecified: Secondary | ICD-10-CM

## 2012-05-16 DIAGNOSIS — R195 Other fecal abnormalities: Secondary | ICD-10-CM

## 2012-05-16 NOTE — Procedures (Addendum)
Small Bowel Givens Capsule Study Procedure date:  05/09/12  Referring Provider:  Dr. Jena Gauss  PCP:  Dr. Milana Obey, MD  Indication for procedure:  IDA, heme positive stools    Findings:   78 year old female with iron deficiency anemia and heme positive stools, who has recently had an overall negative colonoscopy and upper endoscopy. Negative celiac. Capsule study performed to evaluate the small bowel for any source of IDA. No mass, erosion, strictures, AVMs noted. No source of bleeding or etiology for IDA identified. Starting at 25:02 through 25:14, an unclear raised area of small bowel was noted, with features almost characteristic of very small polyp. This was without villous blunting or any evidence of active bleeding; yet, the area of concern appeared to be erythematous. However, this could simply be a light reflection on a normal variation of the small bowel. This will be discussed with Dr. Jena Gauss. I do not believe this is of clinical significance, but it will be reviewed with him prior to final recommendations.   First Gastric image:  00:53  First Duodenal image: 17:36 First Ileo-Cecal Valve image: 56:09 First Cecal image: 56:17 Gastric Passage time: 0h 24m Small Bowel Passage time:  0h 78m  Summary & Recommendations: 78 year old female with IDA and heme positive stools, without evidence of GI source at this time. No areas of erosion, mass, ulcerations noted. However, small possible raised area as above noted. May be due to normal small bowel variation, anatomy. Will review with Dr. Jena Gauss and provide final recommendations at that time. In the interim, patient has stopped Prilosec and started Dexilant. Concern for malabsorption of iron secondary to concomittant use with Prilosec has been raised. We will have Ms. Shad repeat a CBC and ferritin in 6 weeks. If continued evidence of IDA, refer to Hematology. Follow-up in our office in 6 weeks.     ADDENDUM: Reviewed images of  concern with Dr. Jena Gauss. Polypoid type protrusion, although very small, may be of concern considering the appearance. Appears frondy. Due to IDA, heme + stools, and abnormal finding on capsule study, we will refer to Dr. Achilles Dunk at [facility] for further recommendations and opinion.

## 2012-05-16 NOTE — Telephone Encounter (Signed)
Please let pt know there was no evidence of bleeding from capsule study. I want to review with Dr. Jena Gauss due to one tiny area that appeared to be "raised", likely benign.  I'd like her to have CBC and ferritin rechecked in 6 weeks. Follow-up with [place] in 6 weeks as well.

## 2012-05-22 LAB — CLOSTRIDIUM DIFFICILE BY PCR: Toxigenic C. Difficile by PCR: NOT DETECTED

## 2012-05-22 LAB — GIARDIA ANTIGEN: Giardia Screen (EIA): NEGATIVE

## 2012-05-28 ENCOUNTER — Encounter: Payer: Self-pay | Admitting: Gastroenterology

## 2012-05-28 NOTE — Progress Notes (Signed)
Please let pt know that I have reviewed final capsule study with Dr. Jena Gauss. It has taken [place] a bit to review it in its entirety to make sure we have evaluated it completely. In addition, we have looked at an area of concern and discussed need for referral to [facility]. Due to her IDA and a raised, polyp-like small area in her small intestine, we would like her to see Dr. Gwinda Passe to determine if further testing needs to be done.    Benedetto Goad, please refer to [facility] ASAP, Dr. Gwinda Passe, per RMR, to evaluate small bowel abnormality found on capsule study. Please find out if there is any way I can route the "pictures" to him for review prior to appointment.   Thanks!

## 2012-05-28 NOTE — Progress Notes (Signed)
Patient is scheduled with Dr. Gwinda Passe on November 13th at 8:00 am but was put on a cancellation list to be called if someone cancels and patient is aware

## 2012-05-28 NOTE — Telephone Encounter (Signed)
Pt is aware, lab order on file.  Darl Pikes, please nic appt.

## 2012-05-28 NOTE — Progress Notes (Signed)
Pt is aware and is going to talk with Benedetto Goad about getting appointment at [facility].

## 2012-05-29 ENCOUNTER — Other Ambulatory Visit: Payer: Self-pay | Admitting: Gastroenterology

## 2012-05-29 ENCOUNTER — Encounter: Payer: Self-pay | Admitting: Internal Medicine

## 2012-05-29 DIAGNOSIS — D509 Iron deficiency anemia, unspecified: Secondary | ICD-10-CM

## 2012-05-29 NOTE — Telephone Encounter (Signed)
Pt is aware of OV on 10/21 at 1030 with AS and appt card was mailed

## 2012-06-04 ENCOUNTER — Other Ambulatory Visit: Payer: Self-pay

## 2012-06-04 DIAGNOSIS — D509 Iron deficiency anemia, unspecified: Secondary | ICD-10-CM

## 2012-06-06 NOTE — Progress Notes (Signed)
Can you find out if we can send them a disc that has the images of this capsule study? I can save a screen shot and send to him via email. Whichever one is easiest.

## 2012-06-07 NOTE — Progress Notes (Signed)
Patient has an appointment on Nov 13th but has been put on a cancellation list if someone cancels sooner

## 2012-06-24 LAB — CBC
HCT: 33 %
Hemoglobin: 10.2 g/dL — AB (ref 12.0–16.0)
MCH: 25.5
MCHC: 31.2
MCV: 81.7 fL
RBC: 3.99
WBC: 8.7
platelet count: 399

## 2012-07-05 ENCOUNTER — Encounter: Payer: Self-pay | Admitting: Internal Medicine

## 2012-07-08 ENCOUNTER — Ambulatory Visit (INDEPENDENT_AMBULATORY_CARE_PROVIDER_SITE_OTHER): Payer: PRIVATE HEALTH INSURANCE | Admitting: Gastroenterology

## 2012-07-08 ENCOUNTER — Encounter: Payer: Self-pay | Admitting: Gastroenterology

## 2012-07-08 VITALS — BP 105/53 | HR 75 | Temp 97.9°F | Ht 65.5 in | Wt 181.8 lb

## 2012-07-08 DIAGNOSIS — K639 Disease of intestine, unspecified: Secondary | ICD-10-CM

## 2012-07-08 DIAGNOSIS — R197 Diarrhea, unspecified: Secondary | ICD-10-CM

## 2012-07-08 DIAGNOSIS — K219 Gastro-esophageal reflux disease without esophagitis: Secondary | ICD-10-CM

## 2012-07-08 MED ORDER — PANTOPRAZOLE SODIUM 40 MG PO TBEC: 40.0000 mg | DELAYED_RELEASE_TABLET | Freq: Every day | ORAL | Status: DC

## 2012-07-08 NOTE — Progress Notes (Signed)
Referring Provider: Milana Obey, MD Primary Care Physician:  Milana Obey, MD Primary Gastroenterologist: Dr. Jena Gauss   Chief Complaint  Patient presents with  . Follow-up    HPI:  78 year old female who presents today in f/u from capsule study. Hx of IDA, heme + stools with TCS/EGD as below. No significant findings or source for occult GI bleed. Capsule study with one area noting a polyp-like small protrusion, frondy appearance. No active bleeding. Due to history, she has been referred to Dr. Gwinda Passe at [facility] for further evaluation. Celiac negative.  Hx of diarrhea for past few months. Has been on glucophage several years. Will take Imodium and ok for a day then starts diarrhea. Loose stools several times a day till it is "nothing" to come out. Still taking Prilosec. Trial of Dexilant completed, no improvement in diarrhea or reflux symptoms. Had to go back to Prilosec. She doesn't believe she has tried Protonix.   Denies any abdominal cramping. Lives by herself, states she doesn't cook. Eats a sandwich. Denies any abdominal bloating.   Giardia and Cdiff PCR negative in Aug 2013.   Wt stable.   Past Medical History  Diagnosis Date  . Degenerative joint disease     Right THA in 1995; cervical discectomy and fusion-2001  . Diabetes mellitus, type 2     + neuropathy  . Hypertension   . Gastroesophageal reflux disease   . Aortic sclerosis     Long-standing heart murmur  . Emphysema   . Anxiety   . Sigmoid diverticulosis   . Hyperlipidemia   . Obesity   . Cholelithiasis     on u/s 09/2011  . Osteoporosis   . Chest pain 2006    +palpitations; minimal CAD at cath in 2005; normal EF; negative stress echo in 09/2008  . IDA (iron deficiency anemia)     labs 09/2011  . Hiatal hernia     Past Surgical History  Procedure Date  . Anterior cervical discectomy 2001    C4-5, allograft, fixation  . Tonsillectomy   . Total hip arthroplasty 1995    Right  . Colonoscopy  2006  . Cataract extraction w/ intraocular lens implant 2007    Left  . Breast lumpectomy     benign  . Colonoscopy 04/03/12    Dr. Merlyn Lot diverticulosis, negative microscopic  colitis   . Esophagogastroduodenoscopy 04/03/12    Dr. Dot Been hernia, chronic gastritis on bx  . Givens capsule study 05/09/2012    an unclear raised area of small bowel was noted, with features almost  characteristic of very small polyp. This was without villous  blunting or any evidence of active bleeding; yet, the area of  concern appeared to be erythematous. However, this could simply  be a light reflection on a normal variation of the small bowel. REFERRED TO DR. GILLIAM, APPT FOR NOVEMBER 2013.     Current Outpatient Prescriptions  Medication Sig Dispense Refill  . alendronate (FOSAMAX) 70 MG tablet Take 70 mg by mouth every 7 (seven) days. Take with a full glass of water on an empty stomach. On Wednesdays      . aspirin EC 81 MG tablet Take 81 mg by mouth daily.      [name] HYDROcodone-acetaminophen (NORCO) 5-325 MG per tablet Take 1 tablet by mouth 2 (two) times daily as needed. pain      . loperamide (IMODIUM A-D) 2 MG tablet Take 2 mg by mouth as needed.      . metFORMIN (GLUCOPHAGE) 500  MG tablet Take 1,000 mg by mouth 2 (two) times daily with a meal.       . metoprolol (LOPRESSOR) 50 MG tablet Take 50 mg by mouth 2 (two) times daily.      [name] dexlansoprazole (DEXILANT) 60 MG capsule Take 60 mg by mouth daily.      . pantoprazole (PROTONIX) 40 MG tablet Take 1 tablet (40 mg total) by mouth daily.  30 tablet  3    Allergies as of 07/08/2012  . (No Known Allergies)    Family History  Problem Relation Age of Onset  . Cancer Brother     lung, age 23  . Diabetes Mother     + brother  . Heart failure Father   . Hypertension Father     + brother  . Colon cancer Neg Hx   . Uterine cancer Sister     History   Social History  . Marital Status: Divorced    Spouse Name: N/A    Number of  Children: 4  . Years of Education: N/A   Occupational History  . Retired     SNF   Social History Main Topics  . Smoking status: Former Smoker -- 1.0 packs/day for 30 years    Types: Cigarettes    Quit date: 10/15/1970  . Smokeless tobacco: Former Neurosurgeon    Quit date: 09/19/1971   Comment: Quit x 50 years  . Alcohol Use: No  . Drug Use: No  . Sexually Active: None   Other Topics Concern  . None   Social History Narrative  . None    Review of Systems: Gen: Denies fever, chills, anorexia. Denies fatigue, weakness, weight loss.  CV: Denies chest pain, palpitations, syncope, peripheral edema, and claudication. Resp: Denies dyspnea at rest, cough, wheezing, coughing up blood, and pleurisy. GI: Denies vomiting blood, jaundice, and fecal incontinence.   Denies dysphagia or odynophagia. Derm: Denies rash, itching, dry skin Psych: Denies depression, anxiety, memory loss, confusion. No homicidal or suicidal ideation.  Heme: Denies bruising, bleeding, and enlarged lymph nodes.  Physical Exam: BP 105/53  Pulse 75  Temp 97.9 F (36.6 C) (Temporal)  Ht 5' 5.5" (1.664 m)  Wt 181 lb 12.8 oz (82.464 kg)  BMI 29.79 kg/m2 General:   Alert and oriented. No distress noted. Pleasant and cooperative.  Head:  Normocephalic and atraumatic. Eyes:  Conjuctiva clear without scleral icterus. Heart:  S1, S2 present without murmurs, rubs, or gallops. Regular rate and rhythm. Abdomen:  +BS, soft, non-tender and non-distended. No rebound or guarding. No HSM or masses noted. Msk:  Symmetrical without gross deformities. Normal posture. Extremities:  Without edema. Neurologic:  Alert and  oriented x4;  grossly normal neurologically. Skin:  Intact without significant lesions or rashes. Psych:  Alert and cooperative. Normal mood and affect.

## 2012-07-08 NOTE — Patient Instructions (Addendum)
Start taking Metamucil daily (this is supplemental fiber). We have provided samples.   I have changed your reflux medicine. Stop taking Prilosec. Start taking Protonix daily, 30 minutes before the first meal of the day. I have sent this to your pharmacy.  Continue taking Imodium; let's try every other day first thing in the morning. Monitor for constipation.  You may also take a probiotic daily. We have provided samples of Align. Other examples include [employer]age, [employer]. These are all over the counter.  I'd like for you to come back to see Dr. Jena Gauss in 6 weeks.   Please call us if any concerns in the meantime.

## 2012-07-09 ENCOUNTER — Telehealth: Payer: Self-pay | Admitting: Gastroenterology

## 2012-07-09 DIAGNOSIS — K639 Disease of intestine, unspecified: Secondary | ICD-10-CM | POA: Insufficient documentation

## 2012-07-09 NOTE — Assessment & Plan Note (Signed)
Noted polypoid-like lesion on capsule endoscopy, referred to Dr. Gwinda Passe for further evaluation. No significant GI source for IDA found at this time. Update iron, ferritin.

## 2012-07-09 NOTE — Progress Notes (Signed)
Faxed to PCP

## 2012-07-09 NOTE — Telephone Encounter (Signed)
I'm sorry, I can't remember if pt stated she was having blood work done or not.   Need an updated CBC, iron, ferritin for her.  Thanks!

## 2012-07-09 NOTE — Assessment & Plan Note (Signed)
No change in reflux symptoms switching to Dexilant. Resume Prilosec. Diarrheal symptoms unchanged with cessation of Prilosec.

## 2012-07-09 NOTE — Assessment & Plan Note (Signed)
Continued diarrhea of uncertain etiology, TCS up-to-date, wt stable. Negative stool studies. Question element of diabetic enteropathy. Notes improvement with Imodium. Trial of Imodium daily to every other day, add fiber supplementation and Probiotic. Return in 6 weeks with Dr. Jena Gauss.

## 2012-07-10 NOTE — Telephone Encounter (Signed)
Pt was mailed letter and lab orders that were due October 18th. Will send a reminder letter.

## 2012-07-12 ENCOUNTER — Telehealth: Payer: Self-pay | Admitting: *Deleted

## 2012-07-12 NOTE — Telephone Encounter (Signed)
Denise Parrish called today about her blood work. She said that she had it done at Dr Michelle Nasuti office a few weeks ago when she had her flu shot.  I tried to call Dr Michelle Nasuti office, but they are closed on Friday's. Please follow up with her. Thanks.

## 2012-07-15 NOTE — Telephone Encounter (Signed)
Request labs from Dr Sudie Bailey, will let you know when they are here

## 2012-07-15 NOTE — Telephone Encounter (Signed)
Please get copy of blood work from Dr. Michelle Nasuti office and I will have Tobi Bastos review them.

## 2012-07-16 NOTE — Telephone Encounter (Signed)
Received, on your desk

## 2012-07-22 NOTE — Telephone Encounter (Signed)
I obtained outside labs from Dr. Sudie Bailey, to be abstracted. (from Oct 2013).  Hgb 10.2, Hct 32.6 (June was 9.8 and 30.8, respectively).   We just need iron and ferritin. Is there any way we can cancel the CBC?

## 2012-07-24 ENCOUNTER — Other Ambulatory Visit: Payer: Self-pay | Admitting: Gastroenterology

## 2012-07-24 ENCOUNTER — Other Ambulatory Visit: Payer: Self-pay

## 2012-07-24 DIAGNOSIS — D509 Iron deficiency anemia, unspecified: Secondary | ICD-10-CM

## 2012-07-24 NOTE — Telephone Encounter (Signed)
Pt aware, new lab orders for iron and ferritin faxed to the lab. Pt will go by and have it done.

## 2012-07-25 LAB — CBC WITH DIFFERENTIAL/PLATELET
Basophils Absolute: 0 10*3/uL (ref 0.0–0.1)
Basophils Relative: 0 % (ref 0–1)
Eosinophils Absolute: 0.2 10*3/uL (ref 0.0–0.7)
Eosinophils Relative: 2 % (ref 0–5)
HCT: 31.4 % — ABNORMAL LOW (ref 36.0–46.0)
Hemoglobin: 10 g/dL — ABNORMAL LOW (ref 12.0–15.0)
Lymphocytes Relative: 35 % (ref 12–46)
Lymphs Abs: 2.5 10*3/uL (ref 0.7–4.0)
MCH: 24.6 pg — ABNORMAL LOW (ref 26.0–34.0)
MCHC: 31.8 g/dL (ref 30.0–36.0)
MCV: 77.1 fL — ABNORMAL LOW (ref 78.0–100.0)
Monocytes Absolute: 0.6 10*3/uL (ref 0.1–1.0)
Monocytes Relative: 9 % (ref 3–12)
Neutro Abs: 3.8 10*3/uL (ref 1.7–7.7)
Neutrophils Relative %: 54 % (ref 43–77)
Platelets: 442 10*3/uL — ABNORMAL HIGH (ref 150–400)
RBC: 4.07 MIL/uL (ref 3.87–5.11)
RDW: 17 % — ABNORMAL HIGH (ref 11.5–15.5)
WBC: 7.2 10*3/uL (ref 4.0–10.5)

## 2012-07-25 LAB — IRON: Iron: 19 ug/dL — ABNORMAL LOW (ref 42–145)

## 2012-07-26 LAB — FERRITIN: Ferritin: 7 ng/mL — ABNORMAL LOW (ref 10–291)

## 2012-07-31 NOTE — Progress Notes (Signed)
Quick Note:  Hgb slightly improved.  Ferritin and iron continue to trend down. TCS/EGD, Givens capsule on file. No evidence of GI source; however, polypoid-like lesion noted on capsule study. Pt is seeing/has seen Dr. Gwinda Passe at [facility] for further evaluation. Await note from [facility]. Will then refer to Hem/Onc for further evaluation. As of note, no CT imaging on file recently.  Keep appt with RMR ______

## 2012-08-14 ENCOUNTER — Encounter: Payer: Self-pay | Admitting: Internal Medicine

## 2012-08-19 ENCOUNTER — Telehealth: Payer: Self-pay | Admitting: Urgent Care

## 2012-08-19 ENCOUNTER — Ambulatory Visit (INDEPENDENT_AMBULATORY_CARE_PROVIDER_SITE_OTHER): Payer: PRIVATE HEALTH INSURANCE | Admitting: Urgent Care

## 2012-08-19 ENCOUNTER — Encounter: Payer: Self-pay | Admitting: Urgent Care

## 2012-08-19 VITALS — BP 127/59 | HR 64 | Temp 97.6°F | Ht 65.4 in | Wt 183.4 lb

## 2012-08-19 DIAGNOSIS — K219 Gastro-esophageal reflux disease without esophagitis: Secondary | ICD-10-CM

## 2012-08-19 DIAGNOSIS — K639 Disease of intestine, unspecified: Secondary | ICD-10-CM

## 2012-08-19 DIAGNOSIS — D509 Iron deficiency anemia, unspecified: Secondary | ICD-10-CM

## 2012-08-19 DIAGNOSIS — R197 Diarrhea, unspecified: Secondary | ICD-10-CM

## 2012-08-19 NOTE — Assessment & Plan Note (Signed)
See IDA.  

## 2012-08-19 NOTE — Assessment & Plan Note (Addendum)
Persistent stable IDA.  Pt now taking oral iron.  Single small bowel lesion on GIVENS capsule being evaluated by Dr Gwinda Passe at [facility].  She has FU with him Jan 2014.  Admits to rare NSAID use.  If small bowel lesion does no appear to be contributing factor or patient unable to maintain hgb, consider hematology consult.  Continue omeprazole 20mg  daily Continue daily iron Keep appt w/ [facility] in January.  We will be sure they have a copy of your capsule video. Do not use Aleve, ibuprofen, advil or any other over the counter anti-inflammatories. Use Tylenol arthritis instead as directed on bottle for pain. Recheck CBC/ferritin in 2 mo.   Office visit 2 months.  Call sooner if trouble breathing, heart racing, dizziness, or bleeding.

## 2012-08-19 NOTE — Patient Instructions (Addendum)
Continue omeprazole 20mg  daily Continue daily iron Keep appt w/ [facility] in January.  We will be sure they have a copy of your capsule video. Do not use Aleve, ibuprofen, advil or any other over the counter anti-inflammatories. Use Tylenol arthritis instead as directed on bottle for pain Recheck labwork CBC/ferritin in 2 mo. Office visit 2 months.  Call sooner if you develop trouble breathing, heart racing, dizziness, or bleeding.

## 2012-08-19 NOTE — Progress Notes (Signed)
Primary Care Physician:  Milana Obey, MD Primary Gastroenterologist:  Dr. Jena Gauss  Chief Complaint  Patient presents with  . Follow-up    IDA    HPI:  Denise Parrish is a 78 y.o. female here for follow up for iron deficiency anemia.  She has had IDA since 09/2011 & has not required transfusion.  07/31/12 Hgb 9.7 and ferritin 9 at [facility].  Iron 36, &sat 9 & UIBC 372.  She was sent to see Dr Gwinda Passe for abnormal small bowel lesion, however he did not have Givens capsule small bowel video images for evaluation.  She had EGD, colonoscopy & a small bowel biopsy that did not reveal source of IDA 04/03/12 by Dr Jena Gauss.  She reports use of ibuprofen & Aleve prn for arthritic pain usually no more than a couple per week.   Hx diarrhea that has resolved at this time.  Denies heartburn, indigestion, nausea, vomiting, dysphagia, odynophagia or anorexia.   Denies constipation, rectal bleeding, or weight loss.  Overall , she feels well.  Denies chest pain, palpitations, SOB or dizziness.  No further melena.  Giardia/c diff negative.  Denies abdominal pain.  She is on daily iron but cannot remember dose.  Past Medical History  Diagnosis Date  . Degenerative joint disease     Right THA in 1995; cervical discectomy and fusion-2001  . Diabetes mellitus, type 2     + neuropathy  . Hypertension   . Gastroesophageal reflux disease   . Aortic sclerosis     Long-standing heart murmur  . Emphysema   . Anxiety   . Sigmoid diverticulosis   . Hyperlipidemia   . Obesity   . Cholelithiasis     on u/s 09/2011  . Osteoporosis   . Chest pain 2006    +palpitations; minimal CAD at cath in 2005; normal EF; negative stress echo in 09/2008  . IDA (iron deficiency anemia)     labs 09/2011  . Hiatal hernia   . Small bowel lesion     On Given's capsule; Dr Sharin Mons 07/2012, FU 09/2012    Past Surgical History  Procedure Date  . Anterior cervical discectomy 2001    C4-5, allograft, fixation  .  Tonsillectomy   . Total hip arthroplasty 1995    Right  . Colonoscopy 2006  . Cataract extraction w/ intraocular lens implant 2007    Left  . Breast lumpectomy     benign  . Colonoscopy 04/03/12    Dr. Merlyn Lot diverticulosis, negative microscopic  colitis   . Esophagogastroduodenoscopy 04/03/12    Dr. Dot Been hernia, chronic gastritis on bx  . Givens capsule study 05/09/2012    RMR: an unclear raised area of small bowel was noted, with features almost  characteristic of very small polyp. This was without villous  blunting or any evidence of active bleeding; yet, the area of  concern appeared to be erythematous. However, this could simply  be a light reflection on a normal variation of the small bowel. REFERRED TO DR. GILLIAM, APPT FOR NOVEMBER 2013.     Current Outpatient Prescriptions  Medication Sig Dispense Refill  . alendronate (FOSAMAX) 70 MG tablet Take 70 mg by mouth every 7 (seven) days. Take with a full glass of water on an empty stomach. On Wednesdays      . aspirin EC 81 MG tablet Take 81 mg by mouth daily.      [name] HYDROcodone-acetaminophen (NORCO) 5-325 MG per tablet Take 1 tablet by mouth 2 (two)  times daily as needed. pain      . loperamide (IMODIUM A-D) 2 MG tablet Take 2 mg by mouth as needed.      . metFORMIN (GLUCOPHAGE) 500 MG tablet Take 1,000 mg by mouth 2 (two) times daily with a meal.       . metoprolol (LOPRESSOR) 50 MG tablet Take 50 mg by mouth 2 (two) times daily.      . naproxen sodium (ANAPROX) 220 MG tablet Take 220 mg by mouth as needed.      [name] omeprazole (PRILOSEC) 20 MG capsule Take 20 mg by mouth Daily.        Allergies as of 08/19/2012  . (No Known Allergies)    Review of Systems: Gen: see HPI CV: Denies chest pain, angina, palpitations, syncope, orthopnea, PND, peripheral edema, and claudication. Resp: Denies dyspnea at rest, dyspnea with exercise, cough, sputum, wheezing, coughing up blood, and pleurisy. GI: Denies vomiting blood,  jaundice, and fecal incontinence.   Derm: Denies rash, itching, dry skin, hives, moles, warts, or unhealing ulcers.  Psych: Denies depression, anxiety, memory loss, suicidal ideation, hallucinations, paranoia, and confusion. Heme: Denies bruising, bleeding, and enlarged lymph nodes.  Physical Exam: BP 127/59  Pulse 64  Temp 97.6 F (36.4 C) (Temporal)  Ht 5' 5.4" (1.661 m)  Wt 183 lb 6.4 oz (83.19 kg)  BMI 30.15 kg/m2 General:   Alert,  Well-developed, well-nourished, pleasant and cooperative in NAD Eyes:  Sclera clear, no icterus.   Conjunctiva pink. Mouth:  No deformity or lesions, oropharynx pink and moist. Neck:  Supple; no masses or thyromegaly. Heart:  Regular rate and rhythm; no murmurs, clicks, rubs,  or gallops. Abdomen:  Normal bowel sounds.  No bruits.  Soft, non-tender and non-distended without masses, hepatosplenomegaly or hernias noted.  No guarding or rebound tenderness.   Rectal:  Deferred.  Msk:  Symmetrical without gross deformities.  Pulses:  Normal pulses noted. Extremities:  No clubbing or edema. Neurologic:  Alert and oriented x4;  grossly normal neurologically. Skin:  Intact without significant lesions or rashes.

## 2012-08-19 NOTE — Assessment & Plan Note (Signed)
Well controlled on PPI.  Continue omeprazole 20 mg daily

## 2012-08-19 NOTE — Telephone Encounter (Signed)
Thank you! KJ

## 2012-08-19 NOTE — Assessment & Plan Note (Signed)
Resolved

## 2012-08-19 NOTE — Telephone Encounter (Signed)
I emailed Mallard and he took care of sending patients GIVENS to [facility] to Dr. Gwinda Passe in [facility]

## 2012-08-19 NOTE — Progress Notes (Signed)
Faxed to PCP

## 2012-09-18 ENCOUNTER — Emergency Department (HOSPITAL_COMMUNITY)
Admission: EM | Admit: 2012-09-18 | Discharge: 2012-09-19 | Disposition: A | Discharge status: Home / Self Care | Payer: PRIVATE HEALTH INSURANCE | Attending: Emergency Medicine | Admitting: Emergency Medicine

## 2012-09-18 ENCOUNTER — Emergency Department (HOSPITAL_COMMUNITY): Payer: PRIVATE HEALTH INSURANCE

## 2012-09-18 ENCOUNTER — Encounter (HOSPITAL_COMMUNITY): Payer: Self-pay | Admitting: Emergency Medicine

## 2012-09-18 DIAGNOSIS — Z8719 Personal history of other diseases of the digestive system: Secondary | ICD-10-CM | POA: Insufficient documentation

## 2012-09-18 DIAGNOSIS — Z96649 Presence of unspecified artificial hip joint: Secondary | ICD-10-CM | POA: Insufficient documentation

## 2012-09-18 DIAGNOSIS — Y939 Activity, unspecified: Secondary | ICD-10-CM | POA: Insufficient documentation

## 2012-09-18 DIAGNOSIS — Z981 Arthrodesis status: Secondary | ICD-10-CM | POA: Insufficient documentation

## 2012-09-18 DIAGNOSIS — E785 Hyperlipidemia, unspecified: Secondary | ICD-10-CM | POA: Insufficient documentation

## 2012-09-18 DIAGNOSIS — E669 Obesity, unspecified: Secondary | ICD-10-CM | POA: Insufficient documentation

## 2012-09-18 DIAGNOSIS — K219 Gastro-esophageal reflux disease without esophagitis: Secondary | ICD-10-CM | POA: Insufficient documentation

## 2012-09-18 DIAGNOSIS — Z7982 Long term (current) use of aspirin: Secondary | ICD-10-CM | POA: Insufficient documentation

## 2012-09-18 DIAGNOSIS — Z8709 Personal history of other diseases of the respiratory system: Secondary | ICD-10-CM | POA: Insufficient documentation

## 2012-09-18 DIAGNOSIS — Z87891 Personal history of nicotine dependence: Secondary | ICD-10-CM | POA: Insufficient documentation

## 2012-09-18 DIAGNOSIS — IMO0002 Reserved for concepts with insufficient information to code with codable children: Secondary | ICD-10-CM | POA: Insufficient documentation

## 2012-09-18 DIAGNOSIS — Y929 Unspecified place or not applicable: Secondary | ICD-10-CM | POA: Insufficient documentation

## 2012-09-18 DIAGNOSIS — R011 Cardiac murmur, unspecified: Secondary | ICD-10-CM | POA: Insufficient documentation

## 2012-09-18 DIAGNOSIS — M5430 Sciatica, unspecified side: Secondary | ICD-10-CM

## 2012-09-18 DIAGNOSIS — Z862 Personal history of diseases of the blood and blood-forming organs and certain disorders involving the immune mechanism: Secondary | ICD-10-CM | POA: Insufficient documentation

## 2012-09-18 DIAGNOSIS — M1990 Unspecified osteoarthritis, unspecified site: Secondary | ICD-10-CM | POA: Insufficient documentation

## 2012-09-18 DIAGNOSIS — M810 Age-related osteoporosis without current pathological fracture: Secondary | ICD-10-CM | POA: Insufficient documentation

## 2012-09-18 DIAGNOSIS — I10 Essential (primary) hypertension: Secondary | ICD-10-CM | POA: Insufficient documentation

## 2012-09-18 DIAGNOSIS — E1149 Type 2 diabetes mellitus with other diabetic neurological complication: Secondary | ICD-10-CM | POA: Insufficient documentation

## 2012-09-18 DIAGNOSIS — M549 Dorsalgia, unspecified: Secondary | ICD-10-CM

## 2012-09-18 DIAGNOSIS — W19XXXA Unspecified fall, initial encounter: Secondary | ICD-10-CM | POA: Insufficient documentation

## 2012-09-18 DIAGNOSIS — E1142 Type 2 diabetes mellitus with diabetic polyneuropathy: Secondary | ICD-10-CM | POA: Insufficient documentation

## 2012-09-18 DIAGNOSIS — Z79899 Other long term (current) drug therapy: Secondary | ICD-10-CM | POA: Insufficient documentation

## 2012-09-18 MED ORDER — MORPHINE SULFATE 2 MG/ML IJ SOLN
2.0000 mg | Freq: Once | INTRAMUSCULAR | Status: AC
  Administered 2012-09-18: 2 mg via INTRAVENOUS
  Filled 2012-09-18: qty 1

## 2012-09-18 MED ORDER — SODIUM CHLORIDE 0.9 % IV SOLN
Freq: Once | INTRAVENOUS | Status: AC
  Administered 2012-09-18: via INTRAVENOUS

## 2012-09-18 MED ORDER — DIAZEPAM 5 MG/ML IJ SOLN
2.5000 mg | Freq: Once | INTRAMUSCULAR | Status: AC
  Administered 2012-09-18: 2.5 mg via INTRAVENOUS
  Filled 2012-09-18: qty 2

## 2012-09-18 MED ORDER — ACETAMINOPHEN 500 MG PO TABS
1000.0000 mg | ORAL_TABLET | Freq: Once | ORAL | Status: AC
  Administered 2012-09-18: 1000 mg via ORAL
  Filled 2012-09-18: qty 2

## 2012-09-18 NOTE — ED Notes (Signed)
Pt states she fell one month ago on her left side. Pt is c/o increasing left flank and lower back pain since fall.

## 2012-09-18 NOTE — ED Provider Notes (Signed)
History   This chart was scribed for [employer]. Colon Branch, MD by Sofie Rower, ED Scribe. The patient was seen in room APA19/APA19 and the patient's care was started at 11:09PM.     CSN: [id number]  Arrival date & time 09/18/12  2302   First MD Initiated Contact with Patient 09/18/12 2309      Chief Complaint  Patient presents with  . Fall  . Back Pain  . Flank Pain    (Consider location/radiation/quality/duration/timing/severity/associated sxs/prior treatment) The history is provided by the patient. No language interpreter was used.    Denise Parrish is a 79 y.o. female , with a hx of DJD and Osteoporosis, who presents to the Emergency Department complaining of progressively worsening, non radiating sharp, stabbing back pain located at the lower back, onset three days ago (09/15/12).  Associated symptoms include flank pain located at the left flank. The pt reports she is experiencing an intermittent, grabbing, back pain sensation, intensified by certain movements and positions. The pt has taken her latest pain medication at 3:30PM, however, she informs that it does not provide relief of her back pain at present time.  The pt does not smoke or drink alcohol.   PCP is Dr. Sudie Bailey.    Past Medical History  Diagnosis Date  . Degenerative joint disease     Right THA in 1995; cervical discectomy and fusion-2001  . Diabetes mellitus, type 2     + neuropathy  . Hypertension   . Gastroesophageal reflux disease   . Aortic sclerosis     Long-standing heart murmur  . Emphysema   . Anxiety   . Sigmoid diverticulosis   . Hyperlipidemia   . Obesity   . Cholelithiasis     on u/s 09/2011  . Osteoporosis   . Chest pain 2006    +palpitations; minimal CAD at cath in 2005; normal EF; negative stress echo in 09/2008  . IDA (iron deficiency anemia)     labs 09/2011  . Hiatal hernia   . Small bowel lesion     On Given's capsule; Dr Sharin Mons 07/2012, FU 09/2012    Past Surgical History    Procedure Date  . Anterior cervical discectomy 2001    C4-5, allograft, fixation  . Tonsillectomy   . Total hip arthroplasty 1995    Right  . Colonoscopy 2006  . Cataract extraction w/ intraocular lens implant 2007    Left  . Breast lumpectomy     benign  . Colonoscopy 04/03/12    Dr. Merlyn Lot diverticulosis, negative microscopic  colitis   . Esophagogastroduodenoscopy 04/03/12    Dr. Dot Been hernia, chronic gastritis on bx  . Givens capsule study 05/09/2012    RMR: an unclear raised area of small bowel was noted, with features almost  characteristic of very small polyp. This was without villous  blunting or any evidence of active bleeding; yet, the area of  concern appeared to be erythematous. However, this could simply  be a light reflection on a normal variation of the small bowel. REFERRED TO DR. GILLIAM, APPT FOR NOVEMBER 2013.     Family History  Problem Relation Age of Onset  . Cancer Brother     lung, age 59  . Diabetes Mother     + brother  . Heart failure Father   . Hypertension Father     + brother  . Colon cancer Neg Hx   . Uterine cancer Sister     History  Substance Use Topics  .  Smoking status: Former Smoker -- 1.0 packs/day for 30 years    Types: Cigarettes    Quit date: 10/15/1970  . Smokeless tobacco: Former Neurosurgeon    Quit date: 09/19/1971     Comment: Quit x 50 years  . Alcohol Use: No    OB History    Grav Para Term Preterm Abortions TAB SAB Ect Mult Living                  Review of Systems  Constitutional: Negative for fever.       10 Systems reviewed and are negative for acute change except as noted in the HPI.  HENT: Negative for congestion.   Eyes: Negative for discharge and redness.  Respiratory: Negative for cough and shortness of breath.   Cardiovascular: Negative for chest pain.  Gastrointestinal: Negative for vomiting and abdominal pain.  Musculoskeletal: Positive for back pain.  Skin: Negative for rash.  Neurological:  Negative for syncope, numbness and headaches.  Psychiatric/Behavioral:       No behavior change.      Allergies  Review of patient's allergies indicates no known allergies.  Home Medications   Current Outpatient Rx  Name  Route  Sig  Dispense  Refill  . ALENDRONATE SODIUM 70 MG PO TABS   Oral   Take 70 mg by mouth every 7 (seven) days. Take with a full glass of water on an empty stomach. On Wednesdays         . ASPIRIN EC 81 MG PO TBEC   Oral   Take 81 mg by mouth daily.         [name] LOPERAMIDE HCL 2 MG PO TABS   Oral   Take 2 mg by mouth as needed.         [name] METFORMIN HCL 500 MG PO TABS   Oral   Take 1,000 mg by mouth 2 (two) times daily with a meal.          . METOPROLOL TARTRATE 50 MG PO TABS   Oral   Take 50 mg by mouth 2 (two) times daily.         [name] OMEPRAZOLE 20 MG PO CPDR   Oral   Take 20 mg by mouth Daily.         [name] HYDROCODONE-ACETAMINOPHEN 5-325 MG PO TABS   Oral   Take 1 tablet by mouth 2 (two) times daily as needed. pain         . NAPROXEN SODIUM 220 MG PO TABS   Oral   Take 220 mg by mouth as needed.           BP 168/106  Pulse 59  Temp 98.4 F (36.9 C) (Oral)  Resp 20  Ht 5\' 5"  (1.651 m)  Wt 182 lb (82.555 kg)  BMI 30.29 kg/m2  SpO2 99%  Physical Exam  Nursing note and vitals reviewed. Constitutional: She is oriented to person, place, and time. She appears well-developed and well-nourished.  HENT:  Head: Atraumatic.  Nose: Nose normal.  Neck: Normal range of motion.  Cardiovascular: Normal rate.   Pulmonary/Chest: Effort normal.  Abdominal: Soft.  Musculoskeletal: Normal range of motion.       Focal tenderness to the lumbar perispinal muscles on the left. Non tender over buttocks. Non tender at the hip. Full ROM at hip.   Neurological: She is alert and oriented to person, place, and time.  Skin: Skin is warm and dry.  Psychiatric: She has a normal  mood and affect. Her behavior is normal.    ED Course  Procedures  (including critical care time)  DIAGNOSTIC STUDIES: Oxygen Saturation is 99% on room air, normal by my interpretation.    COORDINATION OF CARE:   11:20 PM- Treatment plan discussed with patient. Pt agrees with treatment.   Dg Lumbar Spine Complete  09/19/2012  *RADIOLOGY REPORT*  Clinical Data: Status post fall; severe left hip pain.  LUMBAR SPINE - COMPLETE 4+ VIEW  Comparison: Abdominal radiograph of 10/02/2011  Findings: There is no evidence of fracture or subluxation. Prominent lateral osteophytes are seen along the lumbar spine. Vertebral bodies demonstrate normal height and alignment.  Minimal multilevel disc space narrowing is noted along the lumbar spine.  The visualized bowel gas pattern is unremarkable in appearance; air and stool are noted within the colon.  The sacroiliac joints are within normal limits.  A right hip prosthesis is incompletely imaged but appears grossly unremarkable.  Scattered vascular calcifications are noted.  IMPRESSION:  1.  No evidence of fracture or subluxation along the lumbar spine. 2.  Minimal degenerative change along the lumbar spine. 3.  Scattered vascular calcifications noted.   Original Report Authenticated By: Tonia Ghent, M.D.    Dg Hip Complete Left  09/19/2012  *RADIOLOGY REPORT*  Clinical Data: Status post fall; left hip pain.  LEFT HIP - COMPLETE 2+ VIEW  Comparison: None.  Findings: There is no evidence of fracture or dislocation.  The left femoral neck appears intact.  The proximal left femur is unremarkable in appearance.  Mild degenerative change is noted at the left hip, without significant joint space narrowing.  The patient's right hip prosthesis is unremarkable in appearance, without significant loosening.  Mild degenerative change is noted at the lower lumbar spine.  The sacroiliac joints are unremarkable in appearance.  The visualized bowel gas pattern is grossly unremarkable.  IMPRESSION: No evidence of fracture or dislocation.   Original  Report Authenticated By: Tonia Ghent, M.D.           MDM  Patient with sciatica pain to left side. Given analgesics, muscle relaxant, and tylenol with some improvement. Xrays without acute findings. Reviewed results with patient and family. Pt stable in ED with no significant deterioration in condition.The patient appears reasonably screened and/or stabilized for discharge and I doubt any other medical condition or other [facility] requiring further screening, evaluation, or treatment in the ED at this time prior to discharge.  I personally performed the services described in this documentation, which was scribed in my presence. The recorded information has been reviewed and considered.   MDM Reviewed: nursing note and vitals Interpretation: x-ray            Nicoletta Dress. Colon Branch, MD 09/19/12 [id number]

## 2012-09-19 ENCOUNTER — Telehealth: Payer: Self-pay | Admitting: Internal Medicine

## 2012-09-19 MED ORDER — CYCLOBENZAPRINE HCL 10 MG PO TABS: 10.0000 mg | ORAL_TABLET | Freq: Two times a day (BID) | ORAL | Status: DC | PRN

## 2012-09-19 MED ORDER — ONDANSETRON HCL 4 MG/2ML IJ SOLN
4.0000 mg | Freq: Once | INTRAMUSCULAR | Status: AC
  Administered 2012-09-19: 4 mg via INTRAVENOUS
  Filled 2012-09-19: qty 2

## 2012-09-19 MED ORDER — HYDROCODONE-ACETAMINOPHEN 5-325 MG PO TABS: 1.0000 | ORAL_TABLET | ORAL | Status: AC | PRN

## 2012-09-19 MED ORDER — MORPHINE SULFATE 2 MG/ML IJ SOLN
INTRAMUSCULAR | Status: AC
  Administered 2012-09-19: 2 mg via INTRAVENOUS
  Filled 2012-09-19: qty 1

## 2012-09-19 NOTE — Telephone Encounter (Signed)
Pt called to verify her OV with [place] on 2/3 and also said she was not going back to Dr Arsenio Loader because she was not happy with him. She and a lady in the background were saying that he didn't even examined her and said that he wasn't concerned about the spot on her intestines. She would like to be referred to elsewhere and agreed to follow up with [place]. Please advise and call patient at [phone]

## 2012-09-26 NOTE — Telephone Encounter (Signed)
I suppose we can refer her to the GI clinic at [facility]. I would like to see the records from [facility] first

## 2012-09-27 ENCOUNTER — Other Ambulatory Visit: Payer: Self-pay | Admitting: Internal Medicine

## 2012-09-27 DIAGNOSIS — D509 Iron deficiency anemia, unspecified: Secondary | ICD-10-CM

## 2012-09-27 NOTE — Telephone Encounter (Signed)
Spoke with pt- she will come to her next appt. Informed her that we are sending givens to Dr. Gwinda Passe to get his opinion and will let her know after we hear from him. Lab order to be mailed to pt.

## 2012-09-27 NOTE — Telephone Encounter (Signed)
Ok, thank you

## 2012-09-27 NOTE — Telephone Encounter (Signed)
It appears that [facility] did not have the Given images for review. This should have been sent. This should be sent to Dr. Gwinda Passe for his opinion on the actual images. Please see that this is done. For now, let's arrange for the patient to come back to see [place] in 6 weeks with a CBC just before that visit

## 2012-09-27 NOTE — Telephone Encounter (Signed)
Pt is already scheduled for appt to come back. Lab order to be mailed to pt.  Dawn, please make sure givens is sent to Dr. Con Memos.

## 2012-09-27 NOTE — Telephone Encounter (Signed)
I contacted Denise Parrish at [facility], she will send the images and report to Dr Gwinda Passe at [facility].  Do we need a copy for Dr Jena Gauss to view as well? Thank you.

## 2012-09-27 NOTE — Telephone Encounter (Signed)
No RMR doesn't need a copy. Thanks.

## 2012-10-07 ENCOUNTER — Other Ambulatory Visit: Payer: Self-pay

## 2012-10-07 DIAGNOSIS — D509 Iron deficiency anemia, unspecified: Secondary | ICD-10-CM

## 2012-10-09 ENCOUNTER — Ambulatory Visit (HOSPITAL_COMMUNITY): Payer: PRIVATE HEALTH INSURANCE | Admitting: Physical Therapy

## 2012-10-14 ENCOUNTER — Ambulatory Visit (HOSPITAL_COMMUNITY)
Admission: RE | Admit: 2012-10-14 | Discharge: 2012-10-14 | Disposition: A | Discharge status: Home / Self Care | Payer: PRIVATE HEALTH INSURANCE | Source: Ambulatory Visit | Attending: Family Medicine | Admitting: Family Medicine

## 2012-10-14 DIAGNOSIS — M25559 Pain in unspecified hip: Secondary | ICD-10-CM | POA: Insufficient documentation

## 2012-10-14 DIAGNOSIS — R29898 Other symptoms and signs involving the musculoskeletal system: Secondary | ICD-10-CM | POA: Insufficient documentation

## 2012-10-14 DIAGNOSIS — R262 Difficulty in walking, not elsewhere classified: Secondary | ICD-10-CM | POA: Insufficient documentation

## 2012-10-14 DIAGNOSIS — E119 Type 2 diabetes mellitus without complications: Secondary | ICD-10-CM | POA: Insufficient documentation

## 2012-10-14 DIAGNOSIS — I10 Essential (primary) hypertension: Secondary | ICD-10-CM | POA: Insufficient documentation

## 2012-10-14 DIAGNOSIS — IMO0001 Reserved for inherently not codable concepts without codable children: Secondary | ICD-10-CM | POA: Insufficient documentation

## 2012-10-14 LAB — CBC WITH DIFFERENTIAL/PLATELET
Basophils Absolute: 0 10*3/uL (ref 0.0–0.1)
Basophils Relative: 0 % (ref 0–1)
Eosinophils Absolute: 0.2 10*3/uL (ref 0.0–0.7)
Eosinophils Relative: 3 % (ref 0–5)
HCT: 40 % (ref 36.0–46.0)
Hemoglobin: 12.8 g/dL (ref 12.0–15.0)
Lymphocytes Relative: 34 % (ref 12–46)
Lymphs Abs: 2.4 10*3/uL (ref 0.7–4.0)
MCH: 26.9 pg (ref 26.0–34.0)
MCHC: 32 g/dL (ref 30.0–36.0)
MCV: 84 fL (ref 78.0–100.0)
Monocytes Absolute: 0.6 10*3/uL (ref 0.1–1.0)
Monocytes Relative: 9 % (ref 3–12)
Neutro Abs: 3.8 10*3/uL (ref 1.7–7.7)
Neutrophils Relative %: 54 % (ref 43–77)
Platelets: 396 10*3/uL (ref 150–400)
RBC: 4.76 MIL/uL (ref 3.87–5.11)
RDW: 18.6 % — ABNORMAL HIGH (ref 11.5–15.5)
WBC: 7.1 10*3/uL (ref 4.0–10.5)

## 2012-10-14 LAB — FERRITIN: Ferritin: 21 ng/mL (ref 10–291)

## 2012-10-14 NOTE — Evaluation (Signed)
Physical Therapy Evaluation  Patient Details  Name: Denise Parrish MRN: [id number] Date of Birth: 12-06-1922 Charge:  Evaluation Today's Date: 10/14/2012 Time: 1350-1440 PT Time Calculation (min): 50 min  Visit#: 1  of 8   Re-eval: 11/13/12 Assessment Diagnosis: L hip pain Next MD Visit: 11/13/2012 Prior Therapy: none  Authorization: united health medicare/medicaid     Authorization Visit#: 1  of 8    Past Medical History:  Past Medical History  Diagnosis Date  . Degenerative joint disease     Right THA in 1995; cervical discectomy and fusion-2001  . Diabetes mellitus, type 2     + neuropathy  . Hypertension   . Gastroesophageal reflux disease   . Aortic sclerosis     Long-standing heart murmur  . Emphysema   . Anxiety   . Sigmoid diverticulosis   . Hyperlipidemia   . Obesity   . Cholelithiasis     on u/s 09/2011  . Osteoporosis   . Chest pain 2006    +palpitations; minimal CAD at cath in 2005; normal EF; negative stress echo in 09/2008  . IDA (iron deficiency anemia)     labs 09/2011  . Hiatal hernia   . Small bowel lesion     On Given's capsule; Denise Parrish 07/2012, FU 09/2012   Past Surgical History:  Past Surgical History  Procedure Date  . Anterior cervical discectomy 2001    C4-5, allograft, fixation  . Tonsillectomy   . Total hip arthroplasty 1995    Right  . Colonoscopy 2006  . Cataract extraction w/ intraocular lens implant 2007    Left  . Breast lumpectomy     benign  . Colonoscopy 04/03/12    Denise Parrish diverticulosis, negative microscopic  colitis   . Esophagogastroduodenoscopy 04/03/12    Denise Parrish hernia, chronic gastritis on bx  . Givens capsule study 05/09/2012    RMR: an unclear raised area of small bowel was noted, with features almost  characteristic of very small polyp. This was without villous  blunting or any evidence of active bleeding; yet, the area of  concern appeared to be erythematous. However, this could  simply  be a light reflection on a normal variation of the small bowel. REFERRED TO Denise Parrish, APPT FOR NOVEMBER 2013.     Subjective Symptoms/Limitations Symptoms: Denise Parrish states that she has ben having left hip pain that is going down her leg.  She states the MD says she needs a hip replacement but she does not want to have one.  She fell approximately one month ago and ever since she has had more pain and it has Parrish more painful walking. How long can you sit comfortably?: five minutes  How long can you stand comfortably?: Able to stand for 20 minutes before her back starts to bother her. How long can you walk comfortably?: She is only able to walk without an assistive device she would not be able to walk for five minutes.  She has a cane and a walker at home but she does not use them. Special Tests: She is unable to go up steps reciprocally on a regular basis.   Pain Assessment Currently in Pain?: Yes Pain Score:   3 (5/5 worst) Pain Location: Hip Pain Orientation: Left Pain Type: Chronic pain Pain Relieving Factors: sit in a recliner  Precautions/Restrictions   falls  Cognition/Observation Cognition Overall Cognitive Status: Appears within functional limits for tasks assessed  Sensation/Coordination/Flexibility/Functional Tests Functional Tests Functional Tests: ABC =  33/80 41 %  Assessment LLE Strength Left Hip Flexion: 3-/5 Left Hip Extension: 3-/5 Left Hip ABduction: 3/5 Left Hip ADduction: 5/5 Left Knee Flexion: 3+/5 Left Knee Extension: 3+/5 Left Ankle Dorsiflexion: 5/5  Exercise/Treatments Mobility/Balance  Static Standing Balance Single Leg Stance - Right Leg: 3  Single Leg Stance - Left Leg: 2    Stretches Active Hamstring Stretch: 3 reps;30 seconds Seated Long Arc Quad: 10 reps Supine Bridges: 10 reps Straight Leg Raises: Left;10 reps Sidelying Hip ABduction: Strengthening;Left;10 reps     Physical Therapy Assessment and Plan PT Assessment  and Plan Clinical Impression Statement: Denise Parrish is an 79 yo female who states she was told years ago she needed a THR on her L hip but does not want to have this done.  She fell on her hip two weeks ago incresing the pain in her proximal hamstring area.  She has decreased balance, decreased strtength and tight hamstrings and will benefit from skilled PT to  improve her saftey when ambulating and improve her functional  activity tolerance. Pt will benefit from skilled therapeutic intervention in order to improve on the following deficits: Decreased activity tolerance;Decreased balance;Decreased mobility;Difficulty walking;Decreased strength;Pain Rehab Potential: Good PT Frequency: Min 2X/week PT Duration: 4 weeks PT Treatment/Interventions: Functional mobility training;Therapeutic activities;Therapeutic exercise;Balance training PT Plan: begin bike, heel raise; minisquats, lateral step ups, SLS, tnadem stance, sitde steps, retrosteps;manual techniques as needed to proximal hamstring  mm.    Goals Home Exercise Program Pt will Perform Home Exercise Program: Independently PT Short Term Goals Time to Complete Short Term Goals: 2 weeks PT Short Term Goal 1: Pt strength to be increased 1/2 grade to allow pt to be able to walk for ten minues PT Short Term Goal 2: Pt pain to be no greater than a 3/10 PT Short Term Goal 3: Pt to be able to sit for 15 minutes with comfort. PT Long Term Goals Time to Complete Long Term Goals: 4 weeks PT Long Term Goal 1: Pt strength to be improved by one grade to allow pt to ambulate for 30 minutes. PT Long Term Goal 2: Pt SLS to be increased to at least 10 seconds to decrease risk of falling Long Term Goal 3: no falls in the past two weeks Long Term Goal 4: Pain level to be no greater than a 1/10  80% of the day. PT Long Term Goal 5: Pt to be able to sit for 30 minutes  Problem List Patient Active Problem List  Diagnosis  . Diabetes mellitus, type 2  .  Hypertension  . Hyperlipidemia  . Obesity  . Palpitations  . Aortic sclerosis  . Anemia, iron deficiency  . Right carotid bruit  . Heme positive stool  . GERD (gastroesophageal reflux disease)  . Diarrhea  . Small bowel lesion  . Left leg weakness  . Difficulty in walking    PT - End of Session Activity Tolerance: Treatment limited secondary to medical complications (Comment) (became nauseated and vomitted;pt states she was nervous) General Behavior During Session: [facility] for tasks performed Cognition: [facility] for tasks performed PT Plan of Care PT Home Exercise Plan: given  GP Functional Assessment Tool Used: ABC Functional Limitation: Mobility: Walking and moving around Mobility: Walking and Moving Around Current Status (W0981): At least 40 percent but less than 60 percent impaired, limited or restricted Mobility: Walking and Moving Around Goal Status [phone]): At least 1 percent but less than 20 percent impaired, limited or restricted  RUSSELL,CINDY 10/14/2012, 3:06 PM  Physician Documentation Your signature is required to indicate approval of the treatment plan as stated above.  Please sign and either send electronically or make a copy of this report for your files and return this physician signed original.   Please mark one 1.__approve of plan  2. ___approve of plan with the following conditions.   ______________________________                                                          _____________________ Physician Signature                                                                                                             Date

## 2012-10-16 ENCOUNTER — Ambulatory Visit (HOSPITAL_COMMUNITY): Payer: PRIVATE HEALTH INSURANCE | Admitting: Physical Therapy

## 2012-10-18 ENCOUNTER — Ambulatory Visit (HOSPITAL_COMMUNITY)
Admission: RE | Admit: 2012-10-18 | Discharge: 2012-10-18 | Disposition: A | Discharge status: Home / Self Care | Payer: PRIVATE HEALTH INSURANCE | Source: Ambulatory Visit | Attending: Family Medicine | Admitting: Family Medicine

## 2012-10-18 NOTE — Progress Notes (Signed)
Results faxed to Dr Sudie Bailey and Dr Gwinda Passe

## 2012-10-18 NOTE — Progress Notes (Signed)
Quick Note:  Please let pt know her hgb & ferritin are now normal. This is great! Keep follow up as planned. Thanks ZO:[email or web address],[id number] D, MD CC: Dr Gwinda Passe at [facility] ______

## 2012-10-18 NOTE — Progress Notes (Signed)
Physical Therapy Treatment Patient Details  Name: Denise Parrish MRN: [id number] Date of Birth: 20-Feb-1923  Today's Date: 10/18/2012 Time: 4098-1191 PT Time Calculation (min): 44 min  Visit#: 2  of 8   Re-eval: 11/13/12    Authorization: UHC / Medicaid  Authorization Visit#: 2  of 8   Charge:  There ex 28/ [place] x 8 Subjective:  Pt states she is doing better.   Exercise/Treatments Aerobic Stationary Bike: 6' at 1.0 Standing Heel Raises: 10 reps Functional Squats: 10 reps Other Standing Lumbar Exercises: lateral step ups x 10 B; Tandem forward/retro gt x 2 RT @/; side step x 2 RT  Other Standing Lumbar Exercises: SLS B x 5  Modalities Modalities: Ultrasound Ultrasound Ultrasound Location: L gluteal  Ultrasound Parameters: 1 MHz  1.3 w/xm2 x 8 min  Physical Therapy Assessment and Plan PT Assessment and Plan Clinical Impression Statement: Pt able to self correct LOB with balance activities.  Pt needs VC to keep weight equal on both her R/L sides when doing strengthening exercises but able to complete correctly when cued. PT Plan: begin slow marching/ sit to stand activity next treatment.     Problem List Patient Active Problem List  Diagnosis  . Diabetes mellitus, type 2  . Hypertension  . Hyperlipidemia  . Obesity  . Palpitations  . Aortic sclerosis  . Anemia, iron deficiency  . Right carotid bruit  . Heme positive stool  . GERD (gastroesophageal reflux disease)  . Diarrhea  . Small bowel lesion  . Left leg weakness  . Difficulty in walking    General Behavior During Session: [facility] for tasks performed Cognition: [facility] for tasks performed  GP    RUSSELL,CINDY 10/18/2012, 4:34 PM

## 2012-10-21 ENCOUNTER — Ambulatory Visit (HOSPITAL_COMMUNITY)
Admission: RE | Admit: 2012-10-21 | Discharge: 2012-10-21 | Disposition: A | Discharge status: Home / Self Care | Payer: PRIVATE HEALTH INSURANCE | Source: Ambulatory Visit | Attending: Family Medicine | Admitting: Family Medicine

## 2012-10-21 ENCOUNTER — Ambulatory Visit: Payer: PRIVATE HEALTH INSURANCE | Admitting: Urgent Care

## 2012-10-21 ENCOUNTER — Telehealth: Payer: Self-pay | Admitting: Urgent Care

## 2012-10-21 DIAGNOSIS — M25559 Pain in unspecified hip: Secondary | ICD-10-CM | POA: Insufficient documentation

## 2012-10-21 DIAGNOSIS — R262 Difficulty in walking, not elsewhere classified: Secondary | ICD-10-CM | POA: Insufficient documentation

## 2012-10-21 DIAGNOSIS — E119 Type 2 diabetes mellitus without complications: Secondary | ICD-10-CM | POA: Insufficient documentation

## 2012-10-21 DIAGNOSIS — IMO0001 Reserved for inherently not codable concepts without codable children: Secondary | ICD-10-CM | POA: Insufficient documentation

## 2012-10-21 DIAGNOSIS — I10 Essential (primary) hypertension: Secondary | ICD-10-CM | POA: Insufficient documentation

## 2012-10-21 NOTE — Telephone Encounter (Signed)
Pt was a no show

## 2012-10-21 NOTE — Telephone Encounter (Signed)
Pt is aware of her OV on 2/20 at 1130 with KJ and appt card was mailed

## 2012-10-21 NOTE — Progress Notes (Signed)
Physical Therapy Treatment Patient Details  Name: Denise Parrish MRN: [id number] Date of Birth: 09/28/1922  Today's Date: 10/21/2012 Time: 1351-1440 PT Time Calculation (min): 49 min  Visit#: 3  of 8   Re-eval: 11/13/12 Charges: Therex x 38'  Authorization: UHC / Medicaid  Authorization Visit#: 3  of 8    Subjective: Symptoms/Limitations Symptoms: Pt reports HEP compliance. Pain Assessment Currently in Pain?: Yes Pain Score:   2 Pain Location: Hip Pain Orientation: Left   Exercise/Treatments Standing Heel Raises: 15 reps Lateral Step Up: 10 reps;Step Height: 4";Hand Hold: 2;Both Forward Step Up: 10 reps;Step Height: 4";Hand Hold: 0;Both Functional Squat: 10 reps SLS: L:6" R:7" max of 5 Other Standing Knee Exercises: Side stepping, retro, and tendem gat x 2RT   Modalities Modalities: Ultrasound Ultrasound Ultrasound Location: L gluteal Ultrasound Parameters: 1 MHz 1.3 w/xm2 x 8 min (completed by Nicholes Rough, PTT) No charge  Physical Therapy Assessment and Plan PT Assessment and Plan Clinical Impression Statement: Pt requires multimodal cueing for proper form with functional squats. Pt displays improved stability with tandem gait. Pt requires vc's to keep B hips in neutral when completing sidestepping. [place] completed to decrease L hip pain. Pt reports pain decrease to 0/10 at end of session. PT Plan: Continue to progress per PT POC. Begin slow marching and sit to stand activity next treatment.     Problem List Patient Active Problem List  Diagnosis  . Diabetes mellitus, type 2  . Hypertension  . Hyperlipidemia  . Obesity  . Palpitations  . Aortic sclerosis  . Anemia, iron deficiency  . Right carotid bruit  . Heme positive stool  . GERD (gastroesophageal reflux disease)  . Diarrhea  . Small bowel lesion  . Left leg weakness  . Difficulty in walking    General Behavior During Session: [facility] for tasks performed Cognition: [facility] for tasks performed  Seth Bake, PTA 10/21/2012, 4:23 PM

## 2012-10-21 NOTE — Telephone Encounter (Signed)
Please attempt to reschedule. Thanks

## 2012-10-23 ENCOUNTER — Ambulatory Visit (HOSPITAL_COMMUNITY): Payer: PRIVATE HEALTH INSURANCE | Admitting: Physical Therapy

## 2012-11-07 ENCOUNTER — Ambulatory Visit (INDEPENDENT_AMBULATORY_CARE_PROVIDER_SITE_OTHER): Payer: PRIVATE HEALTH INSURANCE | Admitting: Urgent Care

## 2012-11-07 ENCOUNTER — Encounter: Payer: Self-pay | Admitting: Urgent Care

## 2012-11-07 ENCOUNTER — Telehealth: Payer: Self-pay | Admitting: Urgent Care

## 2012-11-07 VITALS — BP 108/63 | HR 80 | Temp 97.4°F | Ht 65.0 in | Wt 176.0 lb

## 2012-11-07 DIAGNOSIS — K6389 Other specified diseases of intestine: Secondary | ICD-10-CM

## 2012-11-07 DIAGNOSIS — R197 Diarrhea, unspecified: Secondary | ICD-10-CM

## 2012-11-07 DIAGNOSIS — D509 Iron deficiency anemia, unspecified: Secondary | ICD-10-CM

## 2012-11-07 DIAGNOSIS — K639 Disease of intestine, unspecified: Secondary | ICD-10-CM

## 2012-11-07 NOTE — Progress Notes (Signed)
Faxed to PCP

## 2012-11-07 NOTE — Assessment & Plan Note (Signed)
It is unclear whether Dr. Gwinda Passe at [facility] reviewed her small bowel Givens capsule study.  We will followup with him to see if this has been done.

## 2012-11-07 NOTE — Assessment & Plan Note (Signed)
Hemoglobin normal January 2014. No further workup, although I would recheck hemoglobin in 3 months now that she is off oral iron to verify stability.  CBC 3 mo

## 2012-11-07 NOTE — Patient Instructions (Addendum)
Go get a CBC (blood work) in 3 months to check for anemia Begin align or probiotic of choice for diarrhea daily You may use Imodium 2 mg each morning if you're having diarrhea. Do not take if you're constipated. Office visit in 6 months or sooner if needed

## 2012-11-07 NOTE — Assessment & Plan Note (Signed)
Chronic diarrhea. Episodes few days per week. Suspect irritable bowel syndrome versus medication effect. Colonoscopy, small bowel capsule, an EGD reassuring.  Begin align or probiotic of choice for diarrhea daily You may use Imodium 2 mg each morning if you're having diarrhea. Do not take if you're constipated.

## 2012-11-07 NOTE — Progress Notes (Signed)
Primary Care Physician:  Milana Obey, MD Primary Gastroenterologist:  Dr. Jena Gauss  Chief Complaint  Patient presents with  . Diarrhea  . Anemia    HPI:  Denise Parrish is a 79 y.o. female here for follow up for iron deficiency anemia.  She has had IDA since 09/2011 & has not required transfusion.  Hgb last month was normal.  07/31/12 Hgb 9.7 and ferritin 9 at [facility].  She was sent to see Dr Gwinda Passe for abnormal small bowel lesion, however he did not have Givens capsule small bowel video images for evaluation.  She refused to go back for follow up visit to discuss last mo.   She had EGD, colonoscopy & a small bowel biopsy that did not reveal source of IDA 04/03/12 by Dr Jena Gauss.  She had been on ibuprofen & Aleve prn for arthritic pain, but this was discontinued.  Hx chronic diarrhea.  She has diarrhea a couple times per week.  She is afraid to take imodium every day.  She takes a couple times per week.  Imodium does deem to help.  She has not tried probiotics.  Denies abominal pain or rectal bleeding.  She has been off iron since 3 weeks ago.  She is taking prilosec 20mg  daily.  She is also on [place] daily.  She has been on glucophage for yrs. Denies heartburn, indigestion, nausea, vomiting, dysphagia, odynophagia or anorexia.   Denies constipation or weight loss.  Overall , she feels well.    Component     Latest Ref Rng 07/25/2012 10/14/2012  WBC     4.0 - 10.5 K/uL 7.2 7.1  RBC     3.87 - 5.11 MIL/uL 4.07 4.76  Hemoglobin     12.0 - 15.0 g/dL 16.1 (L) 09.6  HCT     36.0 - 46.0 % 31.4 (L) 40.0  MCV     78.0 - 100.0 fL 77.1 (L) 84.0  MCH     26.0 - 34.0 pg 24.6 (L) 26.9  MCHC     30.0 - 36.0 g/dL 04.5 40.9  RDW     81.1 - 15.5 % 17.0 (H) 18.6 (H)  Platelets     150 - 400 K/uL 442 (H) 396    Past Medical History  Diagnosis Date  . Degenerative joint disease     Right THA in 1995; cervical discectomy and fusion-2001  . Diabetes mellitus, type 2     + neuropathy  . Hypertension    . Gastroesophageal reflux disease   . Aortic sclerosis     Long-standing heart murmur  . Emphysema   . Anxiety   . Sigmoid diverticulosis   . Hyperlipidemia   . Obesity   . Cholelithiasis     on u/s 09/2011  . Osteoporosis   . Chest pain 2006    +palpitations; minimal CAD at cath in 2005; normal EF; negative stress echo in 09/2008  . IDA (iron deficiency anemia)     labs 09/2011  . Hiatal hernia   . Small bowel lesion     On Given's capsule; Dr Sharin Mons 07/2012, FU 09/2012    Past Surgical History  Procedure Laterality Date  . Anterior cervical discectomy  2001    C4-5, allograft, fixation  . Tonsillectomy    . Total hip arthroplasty  1995    Right  . Colonoscopy  2006  . Cataract extraction w/ intraocular lens implant  2007    Left  . Breast lumpectomy  benign  . Colonoscopy  04/03/12    Dr. Merlyn Lot diverticulosis, negative microscopic  colitis   . Esophagogastroduodenoscopy  04/03/12    Dr. Dot Been hernia, chronic gastritis on bx  . Givens capsule study  05/09/2012    RMR: an unclear raised area of small bowel was noted, with features almost  characteristic of very small polyp. This was without villous  blunting or any evidence of active bleeding; yet, the area of  concern appeared to be erythematous. However, this could simply  be a light reflection on a normal variation of the small bowel. REFERRED TO DR. GILLIAM, APPT FOR NOVEMBER 2013.     Current Outpatient Prescriptions  Medication Sig Dispense Refill  . alendronate (FOSAMAX) 70 MG tablet Take 70 mg by mouth every 7 (seven) days. Take with a full glass of water on an empty stomach. On Wednesdays      . aspirin EC 81 MG tablet Take 81 mg by mouth daily.      . cyclobenzaprine (FLEXERIL) 10 MG tablet Take 1 tablet (10 mg total) by mouth 2 (two) times daily as needed for muscle spasms.  20 tablet  0  . HYDROcodone-acetaminophen (NORCO) 5-325 MG per tablet Take 1 tablet by mouth 2 (two) times daily as  needed. pain      . loperamide (IMODIUM A-D) 2 MG tablet Take 2 mg by mouth as needed.      . metFORMIN (GLUCOPHAGE) 500 MG tablet Take 1,000 mg by mouth 2 (two) times daily with a meal.       . metoprolol (LOPRESSOR) 50 MG tablet Take 50 mg by mouth 2 (two) times daily.      . naproxen sodium (ANAPROX) 220 MG tablet Take 220 mg by mouth as needed.      [name] omeprazole (PRILOSEC) 20 MG capsule Take 20 mg by mouth Daily.       No current facility-administered medications for this visit.    Allergies as of 11/07/2012  . (No Known Allergies)    Review of Systems: Gen: see HPI CV: Denies chest pain, angina, palpitations, syncope, orthopnea, PND, peripheral edema, and claudication. Resp: Denies dyspnea at rest, dyspnea with exercise, cough, sputum, wheezing, coughing up blood, and pleurisy. GI: Denies vomiting blood, jaundice, and fecal incontinence.   Derm: Denies rash, itching, dry skin, hives, moles, warts, or unhealing ulcers.  Psych: Denies depression, anxiety, memory loss, suicidal ideation, hallucinations, paranoia, and confusion. Heme: Denies bruising, bleeding, and enlarged lymph nodes.  Physical Exam: BP 108/63  Pulse 80  Temp(Src) 97.4 F (36.3 C) (Oral)  Ht 5\' 5"  (1.651 m)  Wt 176 lb (79.833 kg)  BMI 29.29 kg/m2 General:   Alert,  Well-developed, well-nourished, pleasant and cooperative in NAD.  Accompanied by her daughter. Eyes:  Sclera clear, no icterus.   Conjunctiva pink. Mouth:  No deformity or lesions, oropharynx pink and moist. Neck:  Supple; no masses or thyromegaly. Heart:  Regular rate and rhythm; no murmurs, clicks, rubs,  or gallops. Abdomen:  Normal bowel sounds.  No bruits.  Soft, non-tender and non-distended without masses, hepatosplenomegaly or hernias noted.  No guarding or rebound tenderness.   Rectal:  Deferred.  Msk:  Symmetrical without gross deformities.  Pulses:  Normal pulses noted. Extremities:  No edema. Neurologic:  Alert and oriented x4;   grossly normal neurologically. Skin:  Intact without significant lesions or rashes.

## 2012-11-07 NOTE — Telephone Encounter (Signed)
LMOM for Dr Sander Nephew nurse to call me back. I need to know whether or not Dr Gwinda Passe reviewed pt's Givens capsule images as pt did not return for visit in Jan & he did not have images at last visit. Thanks

## 2012-11-08 ENCOUNTER — Telehealth: Payer: Self-pay | Admitting: Urgent Care

## 2012-11-08 NOTE — Telephone Encounter (Signed)
LMOM for return call from Beth with Dr Gwinda Passe.  Jovita Gamma them my pager # for return call.

## 2012-11-08 NOTE — Progress Notes (Signed)
Spoke w/ Victorino Dike at Dr Sander Nephew clinic.  Unsure whether films were reviewed but will discuss with him & call us back. Will send consult note.  At time of visit, he did not feel small bowel DB enteroscopy was warranted.

## 2012-11-11 ENCOUNTER — Telehealth: Payer: Self-pay | Admitting: Urgent Care

## 2012-11-11 NOTE — Telephone Encounter (Signed)
LMOM for return call If she returns call, you may let her know I spoke with [facility] & Dr Gwinda Passe reviewed her capsule study. He does not feel lesion on small bowel capsule is clinically significant. No need for DB SB enteroscopy. Keep FU with [place] as planned. Thanks

## 2012-11-11 NOTE — Telephone Encounter (Signed)
Pt aware of results 

## 2013-09-01 ENCOUNTER — Encounter (HOSPITAL_COMMUNITY): Payer: Self-pay | Admitting: Emergency Medicine

## 2013-09-01 ENCOUNTER — Emergency Department (HOSPITAL_COMMUNITY)
Admission: EM | Admit: 2013-09-01 | Discharge: 2013-09-01 | Disposition: A | Discharge status: Home / Self Care | Payer: PRIVATE HEALTH INSURANCE | Source: Home / Self Care | Attending: Emergency Medicine | Admitting: Emergency Medicine

## 2013-09-01 DIAGNOSIS — K219 Gastro-esophageal reflux disease without esophagitis: Secondary | ICD-10-CM | POA: Insufficient documentation

## 2013-09-01 DIAGNOSIS — K5289 Other specified noninfective gastroenteritis and colitis: Secondary | ICD-10-CM | POA: Insufficient documentation

## 2013-09-01 DIAGNOSIS — I10 Essential (primary) hypertension: Secondary | ICD-10-CM | POA: Insufficient documentation

## 2013-09-01 DIAGNOSIS — Z87891 Personal history of nicotine dependence: Secondary | ICD-10-CM | POA: Insufficient documentation

## 2013-09-01 DIAGNOSIS — R5381 Other malaise: Secondary | ICD-10-CM | POA: Insufficient documentation

## 2013-09-01 DIAGNOSIS — Z8659 Personal history of other mental and behavioral disorders: Secondary | ICD-10-CM | POA: Insufficient documentation

## 2013-09-01 DIAGNOSIS — E669 Obesity, unspecified: Secondary | ICD-10-CM | POA: Insufficient documentation

## 2013-09-01 DIAGNOSIS — K529 Noninfective gastroenteritis and colitis, unspecified: Secondary | ICD-10-CM

## 2013-09-01 DIAGNOSIS — Z79899 Other long term (current) drug therapy: Secondary | ICD-10-CM | POA: Insufficient documentation

## 2013-09-01 DIAGNOSIS — Z862 Personal history of diseases of the blood and blood-forming organs and certain disorders involving the immune mechanism: Secondary | ICD-10-CM | POA: Insufficient documentation

## 2013-09-01 DIAGNOSIS — Z8739 Personal history of other diseases of the musculoskeletal system and connective tissue: Secondary | ICD-10-CM | POA: Insufficient documentation

## 2013-09-01 DIAGNOSIS — R112 Nausea with vomiting, unspecified: Secondary | ICD-10-CM | POA: Diagnosis not present

## 2013-09-01 DIAGNOSIS — Z7982 Long term (current) use of aspirin: Secondary | ICD-10-CM | POA: Insufficient documentation

## 2013-09-01 DIAGNOSIS — E1142 Type 2 diabetes mellitus with diabetic polyneuropathy: Secondary | ICD-10-CM | POA: Insufficient documentation

## 2013-09-01 DIAGNOSIS — E1149 Type 2 diabetes mellitus with other diabetic neurological complication: Secondary | ICD-10-CM | POA: Insufficient documentation

## 2013-09-01 LAB — CBC WITH DIFFERENTIAL/PLATELET
Basophils Absolute: 0 10*3/uL (ref 0.0–0.1)
Basophils Relative: 0 % (ref 0–1)
Eosinophils Absolute: 0.1 10*3/uL (ref 0.0–0.7)
Eosinophils Relative: 1 % (ref 0–5)
HCT: 35.4 % — ABNORMAL LOW (ref 36.0–46.0)
Hemoglobin: 11.4 g/dL — ABNORMAL LOW (ref 12.0–15.0)
Lymphocytes Relative: 32 % (ref 12–46)
Lymphs Abs: 3.1 10*3/uL (ref 0.7–4.0)
MCH: 28 pg (ref 26.0–34.0)
MCHC: 32.2 g/dL (ref 30.0–36.0)
MCV: 87 fL (ref 78.0–100.0)
Monocytes Absolute: 0.8 10*3/uL (ref 0.1–1.0)
Monocytes Relative: 8 % (ref 3–12)
Neutro Abs: 5.8 10*3/uL (ref 1.7–7.7)
Neutrophils Relative %: 59 % (ref 43–77)
Platelets: 388 10*3/uL (ref 150–400)
RBC: 4.07 MIL/uL (ref 3.87–5.11)
RDW: 14.8 % (ref 11.5–15.5)
WBC: 9.9 10*3/uL (ref 4.0–10.5)

## 2013-09-01 LAB — BASIC METABOLIC PANEL
BUN: 13 mg/dL (ref 6–23)
CO2: 22 mEq/L (ref 19–32)
Calcium: 9.5 mg/dL (ref 8.4–10.5)
Chloride: 98 mEq/L (ref 96–112)
Creatinine, Ser: 0.88 mg/dL (ref 0.50–1.10)
GFR calc Af Amer: 66 mL/min — ABNORMAL LOW (ref >90)
GFR calc non Af Amer: 57 mL/min — ABNORMAL LOW (ref >90)
Glucose, Bld: 142 mg/dL — ABNORMAL HIGH (ref 70–99)
Potassium: 3.7 mEq/L (ref 3.5–5.1)
Sodium: 137 mEq/L (ref 135–145)

## 2013-09-01 LAB — GLUCOSE, CAPILLARY: Glucose-Capillary: 136 mg/dL — ABNORMAL HIGH (ref 70–99)

## 2013-09-01 MED ORDER — ONDANSETRON HCL 8 MG PO TABS: 8.0000 mg | ORAL_TABLET | ORAL | Status: DC | PRN

## 2013-09-01 MED ORDER — ONDANSETRON HCL 4 MG/2ML IJ SOLN
4.0000 mg | Freq: Once | INTRAMUSCULAR | Status: AC
  Administered 2013-09-01: 4 mg via INTRAVENOUS
  Filled 2013-09-01: qty 2

## 2013-09-01 MED ORDER — SODIUM CHLORIDE 0.9 % IV BOLUS (SEPSIS)
1000.0000 mL | Freq: Once | INTRAVENOUS | Status: AC
  Administered 2013-09-01: 1000 mL via INTRAVENOUS

## 2013-09-01 NOTE — ED Notes (Signed)
Pt c/o n/v and gen weakness since Saturday. States always has diarrhea but no worse than usual. Pt grimacing. Denies pain, just the nausea. Not vomiting currently. Daughter at bedside. Daughter had ran back to nursing station stating her mother needs to lay down. Pt alert/oriented. gen weakness noted but could ambulate by herself from w/c to stretcher.

## 2013-09-01 NOTE — ED Notes (Signed)
Pt holding water down well. Still nauseated but better per pt. Pt and daughter States she thinks she can go home now. EDP aware.

## 2013-09-01 NOTE — ED Provider Notes (Signed)
CSN: [id number]     Arrival date & time 09/01/13  1037 History   This chart was scribed for Donnetta Hutching, MD, by Yevette Edwards, ED Scribe. This patient was seen in room [facility] and the patient's care was started at 12:07 PM.  First MD Initiated Contact with Patient 09/01/13 1204     Chief Complaint  Patient presents with  . Vomiting    The history is provided by the patient and a relative. No language interpreter was used.   HPI Comments: Denise Parrish is a 79 y.o. female  who presents to the Emergency Department complaining of acute-onset emesis which began two days ago and has been accompanied by nausea, abdominal pain, diarrhea, and generalized weakness. Her daughter believes the symptoms are related to a virus, but is concerned that the pt has become dehydrated. She has had baseline diarrhea for two years, and she has been treated at [facility] for the symptom.  Severity is moderate. She spoke her primary care Dr. who sent her to emergency department.  The pt lives independently.   Dr. Stephannie Peters is her PCP.   She is a former smoker.  Past Medical History  Diagnosis Date  . Degenerative joint disease     Right THA in 1995; cervical discectomy and fusion-2001  . Diabetes mellitus, type 2     + neuropathy  . Hypertension   . Gastroesophageal reflux disease   . Aortic sclerosis     Long-standing heart murmur  . Emphysema   . Anxiety   . Sigmoid diverticulosis   . Hyperlipidemia   . Obesity   . Cholelithiasis     on u/s 09/2011  . Osteoporosis   . Chest pain 2006    +palpitations; minimal CAD at cath in 2005; normal EF; negative stress echo in 09/2008  . IDA (iron deficiency anemia)     labs 09/2011  . Hiatal hernia   . Small bowel lesion     On Given's capsule; Dr Sharin Mons 07/2012, FU 09/2012   Past Surgical History  Procedure Laterality Date  . Anterior cervical discectomy  2001    C4-5, allograft, fixation  . Tonsillectomy    . Total hip arthroplasty   1995    Right  . Colonoscopy  2006  . Cataract extraction w/ intraocular lens implant  2007    Left  . Breast lumpectomy      benign  . Colonoscopy  04/03/12    Dr. Merlyn Lot diverticulosis, negative microscopic  colitis   . Esophagogastroduodenoscopy  04/03/12    Dr. Dot Been hernia, chronic gastritis on bx  . Givens capsule study  05/09/2012    RMR: an unclear raised area of small bowel was noted, with features almost  characteristic of very small polyp. This was without villous  blunting or any evidence of active bleeding; yet, the area of  concern appeared to be erythematous. However, this could simply  be a light reflection on a normal variation of the small bowel. REFERRED TO DR. GILLIAM, APPT FOR NOVEMBER 2013.    Family History  Problem Relation Age of Onset  . Cancer Brother     lung, age 63  . Diabetes Mother     + brother  . Heart failure Father   . Hypertension Father     + brother  . Colon cancer Neg Hx   . Uterine cancer Sister    History  Substance Use Topics  . Smoking status: Former Smoker -- 1.00 packs/day for 30  years    Types: Cigarettes    Quit date: 10/15/1970  . Smokeless tobacco: Former Neurosurgeon    Quit date: 09/19/1971     Comment: Quit x 50 years  . Alcohol Use: No   No OB history provided.  Review of Systems  Gastrointestinal: Positive for nausea, vomiting, abdominal pain and diarrhea.  Neurological: Positive for weakness.  All other systems reviewed and are negative.   Allergies  Review of patient's allergies indicates no known allergies.  Home Medications   Current Outpatient Rx  Name  Route  Sig  Dispense  Refill  . alendronate (FOSAMAX) 70 MG tablet   Oral   Take 70 mg by mouth every 7 (seven) days. Take with a full glass of water on an empty stomach. On Wednesdays         . aspirin EC 81 MG tablet   Oral   Take 81 mg by mouth daily.         [name] loperamide (IMODIUM A-D) 2 MG tablet   Oral   Take 2 mg by mouth as needed.          . meclizine (ANTIVERT) 12.5 MG tablet   Oral   Take 12.5 mg by mouth 3 (three) times daily as needed for dizziness.         . metFORMIN (GLUCOPHAGE) 500 MG tablet   Oral   Take 1,000 mg by mouth 2 (two) times daily with a meal.          . metoprolol (LOPRESSOR) 50 MG tablet   Oral   Take 50 mg by mouth 2 (two) times daily.         [name] omeprazole (PRILOSEC) 20 MG capsule   Oral   Take 20 mg by mouth Daily.          Triage Vitals: BP 144/95  Pulse 57  Temp(Src) 97.8 F (36.6 C) (Oral)  Resp 18  SpO2 98%  Physical Exam  Nursing note and vitals reviewed. Constitutional: She is oriented to person, place, and time. She appears well-developed and well-nourished.  HENT:  Head: Normocephalic and atraumatic.  Eyes: Conjunctivae and EOM are normal. Pupils are equal, round, and reactive to light.  Neck: Normal range of motion. Neck supple.  Cardiovascular: Normal rate, regular rhythm and normal heart sounds.   Pulmonary/Chest: Effort normal and breath sounds normal.  Abdominal: Soft. Bowel sounds are normal. There is tenderness.  Minimal epigastric tenderness.   Musculoskeletal: Normal range of motion.  Neurological: She is alert and oriented to person, place, and time.  Skin: Skin is warm and dry.  Psychiatric: She has a normal mood and affect. Her behavior is normal.    ED Course  Procedures (including critical care time)  DIAGNOSTIC STUDIES: Oxygen Saturation is 98% on room air, normal by my interpretation.    COORDINATION OF CARE:  12:18 PM- Discussed treatment plan with patient, which involves IV fluids, CBC, and anti-nausea medication, and the patient agreed to the plan.   Labs Review Labs Reviewed  CBC WITH DIFFERENTIAL - Abnormal; Notable for the following:    Hemoglobin 11.4 (*)    HCT 35.4 (*)    All other components within normal limits  BASIC METABOLIC PANEL - Abnormal; Notable for the following:    Glucose, Bld 142 (*)    GFR calc non Af Amer  57 (*)    GFR calc Af Amer 66 (*)    All other components within normal limits  GLUCOSE, CAPILLARY -  Abnormal; Notable for the following:    Glucose-Capillary 136 (*)    All other components within normal limits   Imaging Review No results found.  EKG Interpretation   None       MDM  No diagnosis found. Patient feels much better after IV fluids. She feels able to go home. Discharge medications Zofran 8 mg.  Patient has primary care followup   I personally performed the services described in this documentation, which was scribed in my presence. The recorded information has been reviewed and is accurate.    Donnetta Hutching, MD 09/01/13 [phone]

## 2013-09-03 ENCOUNTER — Encounter (HOSPITAL_COMMUNITY): Payer: Self-pay | Admitting: Emergency Medicine

## 2013-09-03 ENCOUNTER — Inpatient Hospital Stay (HOSPITAL_COMMUNITY)
Admission: EM | Admit: 2013-09-03 | Discharge: 2013-09-05 | DRG: 392 | Disposition: A | Discharge status: Home / Self Care | Payer: PRIVATE HEALTH INSURANCE | Attending: Family Medicine | Admitting: Family Medicine

## 2013-09-03 ENCOUNTER — Emergency Department (HOSPITAL_COMMUNITY): Payer: PRIVATE HEALTH INSURANCE

## 2013-09-03 DIAGNOSIS — D509 Iron deficiency anemia, unspecified: Secondary | ICD-10-CM | POA: Diagnosis present

## 2013-09-03 DIAGNOSIS — I10 Essential (primary) hypertension: Secondary | ICD-10-CM

## 2013-09-03 DIAGNOSIS — E1142 Type 2 diabetes mellitus with diabetic polyneuropathy: Secondary | ICD-10-CM | POA: Diagnosis present

## 2013-09-03 DIAGNOSIS — Z8049 Family history of malignant neoplasm of other genital organs: Secondary | ICD-10-CM | POA: Diagnosis not present

## 2013-09-03 DIAGNOSIS — I2510 Atherosclerotic heart disease of native coronary artery without angina pectoris: Secondary | ICD-10-CM | POA: Diagnosis present

## 2013-09-03 DIAGNOSIS — M810 Age-related osteoporosis without current pathological fracture: Secondary | ICD-10-CM | POA: Diagnosis present

## 2013-09-03 DIAGNOSIS — E785 Hyperlipidemia, unspecified: Secondary | ICD-10-CM | POA: Diagnosis present

## 2013-09-03 DIAGNOSIS — Z7982 Long term (current) use of aspirin: Secondary | ICD-10-CM | POA: Diagnosis not present

## 2013-09-03 DIAGNOSIS — E1149 Type 2 diabetes mellitus with other diabetic neurological complication: Secondary | ICD-10-CM | POA: Diagnosis present

## 2013-09-03 DIAGNOSIS — R7401 Elevation of levels of liver transaminase levels: Secondary | ICD-10-CM | POA: Diagnosis present

## 2013-09-03 DIAGNOSIS — Z87891 Personal history of nicotine dependence: Secondary | ICD-10-CM | POA: Diagnosis not present

## 2013-09-03 DIAGNOSIS — D3500 Benign neoplasm of unspecified adrenal gland: Secondary | ICD-10-CM | POA: Diagnosis present

## 2013-09-03 DIAGNOSIS — R1110 Vomiting, unspecified: Secondary | ICD-10-CM | POA: Diagnosis present

## 2013-09-03 DIAGNOSIS — J438 Other emphysema: Secondary | ICD-10-CM | POA: Diagnosis present

## 2013-09-03 DIAGNOSIS — R112 Nausea with vomiting, unspecified: Principal | ICD-10-CM | POA: Diagnosis present

## 2013-09-03 DIAGNOSIS — K219 Gastro-esophageal reflux disease without esophagitis: Secondary | ICD-10-CM | POA: Diagnosis present

## 2013-09-03 DIAGNOSIS — Z96649 Presence of unspecified artificial hip joint: Secondary | ICD-10-CM

## 2013-09-03 DIAGNOSIS — Z833 Family history of diabetes mellitus: Secondary | ICD-10-CM

## 2013-09-03 DIAGNOSIS — M1990 Unspecified osteoarthritis, unspecified site: Secondary | ICD-10-CM | POA: Diagnosis present

## 2013-09-03 DIAGNOSIS — E119 Type 2 diabetes mellitus without complications: Secondary | ICD-10-CM | POA: Diagnosis present

## 2013-09-03 DIAGNOSIS — Z23 Encounter for immunization: Secondary | ICD-10-CM

## 2013-09-03 DIAGNOSIS — F411 Generalized anxiety disorder: Secondary | ICD-10-CM | POA: Diagnosis present

## 2013-09-03 DIAGNOSIS — Z8249 Family history of ischemic heart disease and other diseases of the circulatory system: Secondary | ICD-10-CM

## 2013-09-03 DIAGNOSIS — R7402 Elevation of levels of lactic acid dehydrogenase (LDH): Secondary | ICD-10-CM

## 2013-09-03 DIAGNOSIS — R197 Diarrhea, unspecified: Secondary | ICD-10-CM | POA: Diagnosis present

## 2013-09-03 LAB — CBC WITH DIFFERENTIAL/PLATELET
Basophils Absolute: 0 10*3/uL (ref 0.0–0.1)
Basophils Relative: 0 % (ref 0–1)
Eosinophils Absolute: 0.1 10*3/uL (ref 0.0–0.7)
Eosinophils Relative: 1 % (ref 0–5)
HCT: 34.1 % — ABNORMAL LOW (ref 36.0–46.0)
Hemoglobin: 10.9 g/dL — ABNORMAL LOW (ref 12.0–15.0)
Lymphocytes Relative: 31 % (ref 12–46)
Lymphs Abs: 2.7 10*3/uL (ref 0.7–4.0)
MCH: 28.2 pg (ref 26.0–34.0)
MCHC: 32 g/dL (ref 30.0–36.0)
MCV: 88.1 fL (ref 78.0–100.0)
Monocytes Absolute: 0.7 10*3/uL (ref 0.1–1.0)
Monocytes Relative: 8 % (ref 3–12)
Neutro Abs: 5.2 10*3/uL (ref 1.7–7.7)
Neutrophils Relative %: 59 % (ref 43–77)
Platelets: 337 10*3/uL (ref 150–400)
RBC: 3.87 MIL/uL (ref 3.87–5.11)
RDW: 14.8 % (ref 11.5–15.5)
WBC: 8.8 10*3/uL (ref 4.0–10.5)

## 2013-09-03 LAB — COMPREHENSIVE METABOLIC PANEL
ALT: 47 U/L — ABNORMAL HIGH (ref 0–35)
AST: 52 U/L — ABNORMAL HIGH (ref 0–37)
Albumin: 3.6 g/dL (ref 3.5–5.2)
Alkaline Phosphatase: 46 U/L (ref 39–117)
BUN: 11 mg/dL (ref 6–23)
CO2: 23 mEq/L (ref 19–32)
Calcium: 9.6 mg/dL (ref 8.4–10.5)
Chloride: 102 mEq/L (ref 96–112)
Creatinine, Ser: 0.9 mg/dL (ref 0.50–1.10)
GFR calc Af Amer: 64 mL/min — ABNORMAL LOW (ref >90)
GFR calc non Af Amer: 55 mL/min — ABNORMAL LOW (ref >90)
Glucose, Bld: 139 mg/dL — ABNORMAL HIGH (ref 70–99)
Potassium: 3.5 mEq/L (ref 3.5–5.1)
Sodium: 139 mEq/L (ref 135–145)
Total Bilirubin: 0.4 mg/dL (ref 0.3–1.2)
Total Protein: 7.3 g/dL (ref 6.0–8.3)

## 2013-09-03 LAB — URINALYSIS, ROUTINE W REFLEX MICROSCOPIC
Bilirubin Urine: NEGATIVE
Glucose, UA: NEGATIVE mg/dL
Hgb urine dipstick: NEGATIVE
Ketones, ur: NEGATIVE mg/dL
Leukocytes, UA: NEGATIVE
Nitrite: NEGATIVE
Protein, ur: NEGATIVE mg/dL
Specific Gravity, Urine: 1.015 (ref 1.005–1.030)
Urobilinogen, UA: 0.2 mg/dL (ref 0.0–1.0)
pH: 6 (ref 5.0–8.0)

## 2013-09-03 LAB — LIPASE, BLOOD: Lipase: 21 U/L (ref 11–59)

## 2013-09-03 LAB — GLUCOSE, CAPILLARY: Glucose-Capillary: 119 mg/dL — ABNORMAL HIGH (ref 70–99)

## 2013-09-03 MED ORDER — ZOLPIDEM TARTRATE 5 MG PO TABS
5.0000 mg | ORAL_TABLET | Freq: Once | ORAL | Status: AC
  Administered 2013-09-03: 5 mg via ORAL
  Filled 2013-09-03: qty 1

## 2013-09-03 MED ORDER — ONDANSETRON HCL 4 MG/2ML IJ SOLN
4.0000 mg | Freq: Once | INTRAMUSCULAR | Status: AC
  Administered 2013-09-03: 4 mg via INTRAVENOUS
  Filled 2013-09-03: qty 2

## 2013-09-03 MED ORDER — ONDANSETRON HCL 4 MG PO TABS: 4.0000 mg | ORAL_TABLET | Freq: Four times a day (QID) | ORAL | Status: DC | PRN

## 2013-09-03 MED ORDER — IOHEXOL 300 MG/ML  SOLN
50.0000 mL | Freq: Once | INTRAMUSCULAR | Status: AC | PRN
  Administered 2013-09-03: 50 mL via ORAL

## 2013-09-03 MED ORDER — ASPIRIN EC 81 MG PO TBEC
81.0000 mg | DELAYED_RELEASE_TABLET | Freq: Every day | ORAL | Status: DC
  Administered 2013-09-04 – 2013-09-05 (×2): 81 mg via ORAL
  Filled 2013-09-03 (×2): qty 1

## 2013-09-03 MED ORDER — INSULIN ASPART 100 UNIT/ML ~~LOC~~ SOLN
0.0000 [IU] | Freq: Four times a day (QID) | SUBCUTANEOUS | Status: DC
  Administered 2013-09-05: 1 [IU] via SUBCUTANEOUS

## 2013-09-03 MED ORDER — ACETAMINOPHEN 650 MG RE SUPP: 650.0000 mg | Freq: Four times a day (QID) | RECTAL | Status: DC | PRN

## 2013-09-03 MED ORDER — IOHEXOL 300 MG/ML  SOLN
100.0000 mL | Freq: Once | INTRAMUSCULAR | Status: AC | PRN
  Administered 2013-09-03: 100 mL via INTRAVENOUS

## 2013-09-03 MED ORDER — ACETAMINOPHEN 325 MG PO TABS: 650.0000 mg | ORAL_TABLET | Freq: Four times a day (QID) | ORAL | Status: DC | PRN

## 2013-09-03 MED ORDER — INFLUENZA VAC SPLIT QUAD 0.5 ML IM SUSP
0.5000 mL | INTRAMUSCULAR | Status: DC
  Filled 2013-09-03: qty 0.5

## 2013-09-03 MED ORDER — LOPERAMIDE HCL 2 MG PO CAPS
2.0000 mg | ORAL_CAPSULE | Freq: Once | ORAL | Status: AC
  Administered 2013-09-03: 2 mg via ORAL
  Filled 2013-09-03: qty 1

## 2013-09-03 MED ORDER — PANTOPRAZOLE SODIUM 40 MG PO TBEC
40.0000 mg | DELAYED_RELEASE_TABLET | Freq: Every day | ORAL | Status: DC
  Administered 2013-09-04 – 2013-09-05 (×2): 40 mg via ORAL
  Filled 2013-09-03 (×2): qty 1

## 2013-09-03 MED ORDER — SODIUM CHLORIDE 0.9 % IV SOLN: INTRAVENOUS | Status: DC

## 2013-09-03 MED ORDER — SODIUM CHLORIDE 0.9 % IV SOLN
INTRAVENOUS | Status: DC
  Administered 2013-09-03 – 2013-09-05 (×3): via INTRAVENOUS

## 2013-09-03 MED ORDER — SODIUM CHLORIDE 0.9 % IV BOLUS (SEPSIS)
1000.0000 mL | Freq: Once | INTRAVENOUS | Status: AC
  Administered 2013-09-03: 1000 mL via INTRAVENOUS

## 2013-09-03 MED ORDER — MECLIZINE HCL 12.5 MG PO TABS: 12.5000 mg | ORAL_TABLET | Freq: Three times a day (TID) | ORAL | Status: DC | PRN

## 2013-09-03 MED ORDER — ONDANSETRON HCL 4 MG/2ML IJ SOLN: 4.0000 mg | Freq: Three times a day (TID) | INTRAMUSCULAR | Status: DC | PRN

## 2013-09-03 MED ORDER — METOPROLOL TARTRATE 50 MG PO TABS
50.0000 mg | ORAL_TABLET | Freq: Two times a day (BID) | ORAL | Status: DC
  Administered 2013-09-03 – 2013-09-05 (×4): 50 mg via ORAL
  Filled 2013-09-03 (×2): qty 1
  Filled 2013-09-03 (×2): qty 2

## 2013-09-03 MED ORDER — ONDANSETRON HCL 4 MG/2ML IJ SOLN: 4.0000 mg | Freq: Four times a day (QID) | INTRAMUSCULAR | Status: DC | PRN

## 2013-09-03 NOTE — ED Notes (Signed)
Went to have ct scan will update vitals when PT returns

## 2013-09-03 NOTE — Progress Notes (Signed)
Pt requested med for sleep since she is "in a new place". Does not take sleep med at home. MD paged. Orders given for one time dose of ambien.  Will continue to monitor.

## 2013-09-03 NOTE — H&P (Signed)
History and Physical  Denise Parrish UJW:[id number] DOB: 17-Jul-1923 DOA: 09/03/2013  Referring physician: Dr. Elesa Massed in ED PCP: Milana Obey, MD   Chief Complaint: Vomiting  HPI:  79 year old woman presents with vomiting for the last 5 days. Initial evaluation was fairly unremarkable in the emergency department but the symptoms could not be controlled and so she was referred for admission.  She reports a history of chronic diarrhea for the last 2 years which has not really changed. However 5 days ago 12/13 she developed vomiting having numerous episodes per day, nonbloody, poor oral intake. No abdominal pain, fever or constitutional symptoms. Her symptoms fail to improve she can do the emergency department. She has had no sick contacts.  In the emergency deparment now to be afebrile, vital signs stable. Basic metabolic panel unremarkable, mild transaminitis otherwise LFTs unremarkable. CBC unremarkable. Urinalysis negative. CT of the abdomen and pelvis with right adnexal mass and indeterminate finding within the base of the cecum  Review of Systems:  Negative for fever, visual changes, sore throat, rash, new muscle aches, chest pain, SOB, dysuria, bleeding,   Past Medical History  Diagnosis Date  . Degenerative joint disease     Right THA in 1995; cervical discectomy and fusion-2001  . Diabetes mellitus, type 2     + neuropathy  . Hypertension   . Gastroesophageal reflux disease   . Aortic sclerosis     Long-standing heart murmur  . Emphysema   . Anxiety   . Sigmoid diverticulosis   . Hyperlipidemia   . Obesity   . Cholelithiasis     on u/s 09/2011  . Osteoporosis   . Chest pain 2006    +palpitations; minimal CAD at cath in 2005; normal EF; negative stress echo in 09/2008  . IDA (iron deficiency anemia)     labs 09/2011  . Hiatal hernia   . Small bowel lesion     On Given's capsule; Dr Sharin Mons 07/2012, FU 09/2012    Past Surgical History  Procedure Laterality  Date  . Anterior cervical discectomy  2001    C4-5, allograft, fixation  . Tonsillectomy    . Total hip arthroplasty  1995    Right  . Colonoscopy  2006  . Cataract extraction w/ intraocular lens implant  2007    Left  . Breast lumpectomy      benign  . Colonoscopy  04/03/12    Dr. Merlyn Lot diverticulosis, negative microscopic  colitis   . Esophagogastroduodenoscopy  04/03/12    Dr. Dot Been hernia, chronic gastritis on bx  . Givens capsule study  05/09/2012    RMR: an unclear raised area of small bowel was noted, with features almost  characteristic of very small polyp. This was without villous  blunting or any evidence of active bleeding; yet, the area of  concern appeared to be erythematous. However, this could simply  be a light reflection on a normal variation of the small bowel. REFERRED TO DR. GILLIAM, APPT FOR NOVEMBER 2013.     Social History:  reports that she quit smoking about 42 years ago. Her smoking use included Cigarettes. She has a 30 pack-year smoking history. She quit smokeless tobacco use about 41 years ago. She reports that she does not drink alcohol or use illicit drugs.  No Known Allergies  Family History  Problem Relation Age of Onset  . Cancer Brother     lung, age 37  . Diabetes Mother     + brother  . Heart failure  Father   . Hypertension Father     + brother  . Colon cancer Neg Hx   . Uterine cancer Sister      Prior to Admission medications   Medication Sig Start Date End Date Taking? Authorizing Provider  alendronate (FOSAMAX) 70 MG tablet Take 70 mg by mouth every 7 (seven) days. Take with a full glass of water on an empty stomach. On Wednesdays   Yes Historical Provider, MD  aspirin EC 81 MG tablet Take 81 mg by mouth daily.   Yes Historical Provider, MD  loperamide (IMODIUM A-D) 2 MG tablet Take 2 mg by mouth as needed.   Yes Historical Provider, MD  meclizine (ANTIVERT) 12.5 MG tablet Take 12.5 mg by mouth 3 (three) times daily as  needed for dizziness.   Yes Historical Provider, MD  metFORMIN (GLUCOPHAGE) 500 MG tablet Take 1,000 mg by mouth 2 (two) times daily with a meal.    Yes Historical Provider, MD  metoprolol (LOPRESSOR) 50 MG tablet Take 50 mg by mouth 2 (two) times daily.   Yes Historical Provider, MD  omeprazole (PRILOSEC) 20 MG capsule Take 20 mg by mouth Daily. 08/05/12  Yes Historical Provider, MD  ondansetron (ZOFRAN) 8 MG tablet Take 1 tablet (8 mg total) by mouth every 4 (four) hours as needed for nausea or vomiting. 09/01/13  Yes Donnetta Hutching, MD   Physical Exam: Filed Vitals:   09/03/13 1324 09/03/13 1638 09/03/13 1640 09/03/13 1745  BP: 132/50 160/70  192/60  Pulse: 55  59 63  Temp: 97.6 F (36.4 C)   98.6 F (37 C)  TempSrc: Oral   Oral  Resp: 16   18  Height: 5\' 5"  (1.651 m)     Weight: 82.555 kg (182 lb)     SpO2: 98%  94% 96%   General:  Appears calm and comfortable, nontoxic Eyes: PERRL, normal lids, irises  ENT: grossly normal hearing, lips & tongue Neck: no LAD, masses or thyromegaly Cardiovascular: RRR, no m/r/g. No LE edema. Respiratory: CTA bilaterally, no w/r/r. Normal respiratory effort. Abdomen: soft, ntnd, positive bowel sounds. No right upper quadrant and epigastric pain. Skin: no rash or induration seen  Musculoskeletal: grossly normal tone BUE/BLE Psychiatric: grossly normal mood and affect, speech fluent and appropriate Neurologic: grossly non-focal.  Wt Readings from Last 3 Encounters:  09/03/13 82.555 kg (182 lb)  11/07/12 79.833 kg (176 lb)  09/18/12 82.555 kg (182 lb)    Labs on Admission:  Basic Metabolic Panel:  Recent Labs Lab 09/01/13 1114 09/03/13 1419  NA 137 139  K 3.7 3.5  CL 98 102  CO2 22 23  GLUCOSE 142* 139*  BUN 13 11  CREATININE 0.88 0.90  CALCIUM 9.5 9.6    Liver Function Tests:  Recent Labs Lab 09/03/13 1419  AST 52*  ALT 47*  ALKPHOS 46  BILITOT 0.4  PROT 7.3  ALBUMIN 3.6    Recent Labs Lab 09/03/13 1419  LIPASE 21     CBC:  Recent Labs Lab 09/01/13 1114 09/03/13 1419  WBC 9.9 8.8  NEUTROABS 5.8 5.2  HGB 11.4* 10.9*  HCT 35.4* 34.1*  MCV 87.0 88.1  PLT 388 337    CBG:  Recent Labs Lab 09/01/13 1103  GLUCAP 136*     Radiological Exams on Admission: Ct Abdomen Pelvis W Contrast  09/03/2013   CLINICAL DATA:  Nausea, vomiting, diarrhea dx  EXAM: CT ABDOMEN AND PELVIS WITH CONTRAST  TECHNIQUE: Multidetector CT imaging of the abdomen and  pelvis was performed using the standard protocol following bolus administration of intravenous contrast.  CONTRAST:  50mL OMNIPAQUE IOHEXOL 300 MG/ML SOLN, OMNIPAQUE IOHEXOL 300 MG/ML SOLN  COMPARISON:  None.  FINDINGS: Hypoventilation is appreciated within the lung bases.  There is mild diffuse low attenuation throughout the liver parenchyma. Small punctate calcification projects within the anterior base of the liver.  The spleen, right adrenal, pancreas are unremarkable. A lobulated nodule is identified involving the left adrenal measuring 2.5 x 1 cm and maximal orthogonal dimensions image number 26 series 2. The Hounsfield units of this finding are greater than 10. This likely represents an adrenal adenoma. Surveillance evaluation in 6-12 months recommended. Evaluate kidneys demonstrate bilateral extrarenal pelvises right greater than left. There is no evidence of hydronephrosis no hydroureter. Bilateral renal cyst are identified left greater than right.  Evaluate and pelvis demonstrates a mass within the right adnexa measuring 4.2 x 2 cm. This area demonstrates a solid and cystic component. The cystic component measures 3.2 cm by 2.5 cm image 57 series 2 with Hounsfield units of -3. These findings likely represent an ovarian remnant with a cystic component. Punctate calcifications identified within the soft tissue component. Further evaluation with pelvic ultrasound is recommended. Evaluation of pelvis is degraded by beam hardening artifact due to a right hip  arthroplasty. Diverticulosis appreciated within the sigmoid colon without evidence of diverticulitis. There is no evidence of bowel obstruction, enteritis or colitis. The appendix is identified and is unremarkable.  Evaluation of the cecum demonstrates an irregularly bordered heterogeneously attenuating area within the base of the cecum. Image 55 series 2. The cecum is poorly opacified with contrast. Differential considerations are the sequela of stool within the cecum. Due to the irregularity of the border of this finding a mass cannot be completely excluded. Repeat delayed imaging of the lower abdomen and upper pelvis may help to further characterize this finding. If this finding persists further evaluation with GI consultation recommended  No further abdominal or pelvic masses, free fluid or loculated fluid collections are appreciated.  There is no evidence of abdominal aortic aneurysm. Atherosclerotic calcifications are appreciated within the abdominal aorta. The celiac, SMA, IMA, SMV, portal vein are opacified. There is decreased opacification in the aorta and mesenteric arteries.  IMPRESSION: Small mass with soft tissue and cystic components in the right adnexal region. Punctate calcifications are appreciated within this mass. Further evaluation pelvic ultrasound is recommended. Differential considerations are an ovarian remnant with possibly a benign cyst.  All indeterminate finding within the base of the cecum. Re evaluation with delayed imaging 45-90 min, CT, is recommended to further characterize this region as described above.  Likely adenoma involving the left adrenal surveillance evaluation recommended.   Electronically Signed   By: Salome Holmes M.D.   On: 09/03/2013 16:01    Principal Problem:   Nausea vomiting and diarrhea Active Problems:   Diabetes mellitus, type 2   Anemia, iron deficiency   Vomiting   Transaminitis   Assessment/Plan 79 year old woman with history of chronic diarrhea  of unclear etiology who presented with a five-day history of nonbloody vomiting and poor oral intake. Initial evaluation was unrevealing of etiology and she was admitted for hydration, antiemetics and further evaluation.  1. Nausea vomiting, etiology unclear, possibly viral, no acute pathology on CT, no fever, no leukocytosis 2. Chronic diarrhea without change 3. Mild transaminitis of unclear significance, not on a statin 4. Mild normocytic anemia, history of anemia per chart review 5. Diabetes mellitus type 2  6. Soft tissue mass right adnexa of unclear significance 7. Abnormal appearance of the cecum   Observation  IV fluids, antiemetics, GI consultation for further comment  Stool studies, GI pathogen panel, C. difficile although diarrhea does not appear to have significantly changed  Repeat CMP, CBC in the morning  Sliding-scale insulin  Consider pelvic ultrasound in/outpatient  Code Status: full code  DVT prophylaxis: scds Family Communication: daughter at bedside, I reviewed all of her findings, imaging findings, diagnoses and plans Disposition Plan/Anticipated LOS: Observation 1-2 days  Time spent: 50 minutes  Brendia Sacks, MD  [facility] Pager [phone] 09/03/2013, 7:16 PM

## 2013-09-03 NOTE — ED Notes (Signed)
Pt here Monday for n/v/d. States she is not getting any better

## 2013-09-03 NOTE — ED Provider Notes (Signed)
This chart was scribed for Layla Maw Ward, DO by Caryn Bee, ED Scribe. This patient was seen in room APA14/[facility]14 and the patient's care was started 1:45 PM.  TIME SEEN: 1:44 PM  CHIEF COMPLAINT: Nausea, vomiting, abdominal pain diarrhea  HPI: Pt is a 79 y.o. F with h/o non-insulin-dependent diabetes, hypertension, hyperlipidemia who presents with complaints of gradually worsening diarrhea that began 12/14. Pt has had constant diarrhea for 2 years. Her current episode is accompanied by nausea and vomiting. Pt has spit up and had dry heaves today but has not had an episode of emesis since 12/15. Pt usually has diarrhea each time she uses the bathroom at baseline. She reports increased episodes of diarrhea over the past few days. He was seen in the ED on 12/15 with similar symptoms and had unremarkable labs and was treated symptomatically with IV fluids and Zofran and felt better and was discharged home. She states her symptoms have returned and have not improved with taking antiemetics antidiarrheal agents at home. She denies bloody stools, black tarry stools, fever, chills, dysuria, hematuria, chest pain.  ROS: See HPI Constitutional: no fever, no chills Eyes: no drainage  ENT: no runny nose   Cardiovascular:  no chest pain  Resp: no SOB  GI: positive vomiting, nausea, diarrhea, no abdominal pain GU: no dysuria, no hematuria Integumentary: no rash  Allergy: no hives  Musculoskeletal: no leg swelling  Neurological: no slurred speech ROS otherwise negative  PAST MEDICAL HISTORY/PAST SURGICAL HISTORY:  Past Medical History  Diagnosis Date  . Degenerative joint disease     Right THA in 1995; cervical discectomy and fusion-2001  . Diabetes mellitus, type 2     + neuropathy  . Hypertension   . Gastroesophageal reflux disease   . Aortic sclerosis     Long-standing heart murmur  . Emphysema   . Anxiety   . Sigmoid diverticulosis   . Hyperlipidemia   . Obesity   . Cholelithiasis    on u/s 09/2011  . Osteoporosis   . Chest pain 2006    +palpitations; minimal CAD at cath in 2005; normal EF; negative stress echo in 09/2008  . IDA (iron deficiency anemia)     labs 09/2011  . Hiatal hernia   . Small bowel lesion     On Given's capsule; Dr Sharin Mons 07/2012, FU 09/2012    MEDICATIONS:  Prior to Admission medications   Medication Sig Start Date End Date Taking? Authorizing Provider  alendronate (FOSAMAX) 70 MG tablet Take 70 mg by mouth every 7 (seven) days. Take with a full glass of water on an empty stomach. On Wednesdays    Historical Provider, MD  aspirin EC 81 MG tablet Take 81 mg by mouth daily.    Historical Provider, MD  loperamide (IMODIUM A-D) 2 MG tablet Take 2 mg by mouth as needed.    Historical Provider, MD  meclizine (ANTIVERT) 12.5 MG tablet Take 12.5 mg by mouth 3 (three) times daily as needed for dizziness.    Historical Provider, MD  metFORMIN (GLUCOPHAGE) 500 MG tablet Take 1,000 mg by mouth 2 (two) times daily with a meal.     Historical Provider, MD  metoprolol (LOPRESSOR) 50 MG tablet Take 50 mg by mouth 2 (two) times daily.    Historical Provider, MD  omeprazole (PRILOSEC) 20 MG capsule Take 20 mg by mouth Daily. 08/05/12   Historical Provider, MD  ondansetron (ZOFRAN) 8 MG tablet Take 1 tablet (8 mg total) by mouth every 4 (  four) hours as needed for nausea or vomiting. 09/01/13   Donnetta Hutching, MD    ALLERGIES:  No Known Allergies  SOCIAL HISTORY:  History  Substance Use Topics  . Smoking status: Former Smoker -- 1.00 packs/day for 30 years    Types: Cigarettes    Quit date: 10/15/1970  . Smokeless tobacco: Former Neurosurgeon    Quit date: 09/19/1971     Comment: Quit x 50 years  . Alcohol Use: No    FAMILY HISTORY: Family History  Problem Relation Age of Onset  . Cancer Brother     lung, age 33  . Diabetes Mother     + brother  . Heart failure Father   . Hypertension Father     + brother  . Colon cancer Neg Hx   . Uterine cancer  Sister     EXAM: BP 132/50  Pulse 55  Temp(Src) 97.6 F (36.4 C) (Oral)  Resp 16  Ht 5\' 5"  (1.651 m)  Wt 182 lb (82.555 kg)  BMI 30.29 kg/m2  SpO2 98% CONSTITUTIONAL: Alert and oriented and responds appropriately to questions. Well-appearing; well-nourished HEAD: Normocephalic EYES: Conjunctivae clear, PERRL ENT: normal nose; no rhinorrhea; dry mucous membranes; pharynx without lesions noted NECK: Supple, no meningismus, no LAD  CARD: RRR; S1 and S2 appreciated; no murmurs, no clicks, no rubs, no gallops RESP: Normal chest excursion without splinting or tachypnea; breath sounds clear and equal bilaterally; no wheezes, no rhonchi, no rales,  ABD/GI: Normal bowel sounds; non-distended; soft, non-tender, no rebound, no guarding, tender to palpation at epigastric region BACK:  The back appears normal and is non-tender to palpation, there is no CVA tenderness EXT: Normal ROM in all joints; non-tender to palpation; no edema; normal capillary refill; no cyanosis    SKIN: Normal color for age and race; warm NEURO: Moves all extremities equally PSYCH: The patient's mood and manner are appropriate. Grooming and personal hygiene are appropriate.  MEDICAL DECISION MAKING: Patient here with abdominal pain that she describes as a sore pain without radiation, nausea, vomiting and diarrhea. She is hemodynamically stable. Her abdominal exam is relatively benign and she is mildly tender to palpation in her upper abdomen. Will obtain labs, urine and CT of her abdomen and pelvis. We'll give IV fluids and antiemetics. Patient and daughter at bedside are comfortable with this plan.  ED PROGRESS: Patient's labs are reassuring other than very mild elevation of her LFTs. She states she does have her gallbladder and has a history of gallstones. On reexamination, patient has no right upper quadrant tenderness and negative Murphy sign. Her CT scan shows possible adnexal lesion on the right as well as a possible  cecal mass. Have recommended outpatient followup for both of these things. She has been followed by gastroenterology at [facility] but family would like a second opinion. Patient is still having vomiting and feeling poorly in the emergency department. Have recommended admission for symptom control and patient and family agree. Will continue IV fluids, Zofran. Her primary care physician is Dr. Sudie Bailey.   Spoke with hospitalist, Dr. Irene Limbo for admission for symptom control.  Layla Maw Ward, DO 09/03/13 1652

## 2013-09-04 ENCOUNTER — Encounter (HOSPITAL_COMMUNITY): Payer: Self-pay | Admitting: Gastroenterology

## 2013-09-04 ENCOUNTER — Inpatient Hospital Stay (HOSPITAL_COMMUNITY): Payer: PRIVATE HEALTH INSURANCE

## 2013-09-04 DIAGNOSIS — R197 Diarrhea, unspecified: Secondary | ICD-10-CM

## 2013-09-04 DIAGNOSIS — D649 Anemia, unspecified: Secondary | ICD-10-CM

## 2013-09-04 DIAGNOSIS — R112 Nausea with vomiting, unspecified: Secondary | ICD-10-CM

## 2013-09-04 LAB — COMPREHENSIVE METABOLIC PANEL WITH GFR
ALT: 39 U/L — ABNORMAL HIGH (ref 0–35)
AST: 42 U/L — ABNORMAL HIGH (ref 0–37)
Albumin: 3 g/dL — ABNORMAL LOW (ref 3.5–5.2)
Alkaline Phosphatase: 38 U/L — ABNORMAL LOW (ref 39–117)
BUN: 8 mg/dL (ref 6–23)
CO2: 25 meq/L (ref 19–32)
Calcium: 8.5 mg/dL (ref 8.4–10.5)
Chloride: 104 meq/L (ref 96–112)
Creatinine, Ser: 0.94 mg/dL (ref 0.50–1.10)
GFR calc Af Amer: 61 mL/min — ABNORMAL LOW
GFR calc non Af Amer: 53 mL/min — ABNORMAL LOW
Glucose, Bld: 122 mg/dL — ABNORMAL HIGH (ref 70–99)
Potassium: 3.3 meq/L — ABNORMAL LOW (ref 3.5–5.1)
Sodium: 139 meq/L (ref 135–145)
Total Bilirubin: 0.7 mg/dL (ref 0.3–1.2)
Total Protein: 6.3 g/dL (ref 6.0–8.3)

## 2013-09-04 LAB — CBC
HCT: 30.1 % — ABNORMAL LOW (ref 36.0–46.0)
Hemoglobin: 9.8 g/dL — ABNORMAL LOW (ref 12.0–15.0)
MCH: 28.6 pg (ref 26.0–34.0)
MCHC: 32.6 g/dL (ref 30.0–36.0)
MCV: 87.8 fL (ref 78.0–100.0)
Platelets: 313 10*3/uL (ref 150–400)
RBC: 3.43 MIL/uL — ABNORMAL LOW (ref 3.87–5.11)
RDW: 14.8 % (ref 11.5–15.5)
WBC: 7.8 10*3/uL (ref 4.0–10.5)

## 2013-09-04 LAB — GLUCOSE, CAPILLARY
Glucose-Capillary: 117 mg/dL — ABNORMAL HIGH (ref 70–99)
Glucose-Capillary: 114 mg/dL — ABNORMAL HIGH (ref 70–99)
Glucose-Capillary: 119 mg/dL — ABNORMAL HIGH (ref 70–99)

## 2013-09-04 LAB — FERRITIN: Ferritin: 17 ng/mL (ref 10–291)

## 2013-09-04 LAB — IRON AND TIBC
Iron: 47 ug/dL (ref 42–135)
Saturation Ratios: 13 % — ABNORMAL LOW (ref 20–55)
TIBC: 362 ug/dL (ref 250–470)
UIBC: 315 ug/dL (ref 125–400)

## 2013-09-04 LAB — VITAMIN B12: Vitamin B-12: 420 pg/mL (ref 211–911)

## 2013-09-04 MED ORDER — ZOLPIDEM TARTRATE 5 MG PO TABS
5.0000 mg | ORAL_TABLET | Freq: Once | ORAL | Status: AC
  Administered 2013-09-04: 5 mg via ORAL
  Filled 2013-09-04: qty 1

## 2013-09-04 NOTE — Progress Notes (Signed)
UR chart review completed.  

## 2013-09-04 NOTE — Care Management Note (Addendum)
    Page 1 of 1   09/05/2013     12:04:41 PM   CARE MANAGEMENT NOTE 09/05/2013  Patient:  Denise Parrish, Denise Parrish   Account Number:  [id number]  Date Initiated:  09/04/2013  Documentation initiated by:  Sharrie Rothman  Subjective/Objective Assessment:   Pt admitted from home with vomiting. Pt lives alone but has 3 daughters who are very active in the care of the pt. Pt has a cane and walker for home use but is fairly independent with ADL's.     Action/Plan:   No CM needs noted.   Anticipated DC Date:  09/06/2013   Anticipated DC Plan:  HOME/SELF CARE      DC Planning Services  CM consult      Choice offered to / List presented to:             Status of service:  Completed, signed off Medicare Important Message given?   (If response is "NO", the following Medicare IM given date fields will be blank) Date Medicare IM given:   Date Additional Medicare IM given:    Discharge Disposition:  HOME/SELF CARE  Per UR Regulation:    If discussed at Long Length of Stay Meetings, dates discussed:    Comments:  09/05/13 1200 Tammy blackwell, RN BSN CM Pt discharged home today. No CM needs noted.  09/04/13 1145 Arlyss Queen, RN BSN CM

## 2013-09-04 NOTE — Consult Note (Addendum)
Referring Provider: Milana Obey, MD Primary Care Physician:  Milana Obey, MD Primary Gastroenterologist:  Roetta Sessions, MD  Reason for Consultation:  N/V/D  HPI: Denise Parrish is a 79 y.o. female admitted with acute onset vomiting which began Saturday. She also has chronic diarrhea but does not feel that this is changed. Denies any ill contacts or recent antibiotic use. Abdominal soreness related to multiple episodes of vomiting. Denies hematemesis. Initially evaluated in the emergency department on 09/01/2013. She improved during her ER stay and was sent home. She returned yesterday with persistent vomiting.  ER evaluation revealed mildly elevated transaminases. Mild anemia noted, history of prior iron deficiency anemia although her hemoglobin was normal back in January. Please refer to past medical history/past surgical history. CT of the abdomen pelvis with contrast showed mild diffuse fatty liver. Lobulated nodule involving left adrenal measuring 2.5 x 1 cm. Felt to be adrenal adenoma a surveillance evaluation in 6-12 months recommended. 4.2 x 2 cm mass noted in the right adnexa demonstrating solid and cystic component. Pelvic ultrasound planned. Evaluation of the cecum demonstrated irregularly bordered heterogeneous attenuating area within the base of the cecum. Cecum was poorly opacified with contrast. Mass cannot be excluded however this could be related to stool. Repeat evaluation with delayed imaging was recommended but is not clear that this was done.  She has a history of iron deficiency anemia noted in January 2013. EGD, colonoscopy and small bowel biopsy did not reveal a source of IDA back in July 2013. Small bowel capsule endoscopy was performed and there was a question of small bowel lesion so she was referred to Dr. Gwinda Passe who reviewed the images and felt like no further workup was needed. Her hemoglobin was normal in January of this year, had been off of iron for about 3  weeks at that time. She complains of ongoing chronic diarrhea. Every time urinates. Never solid stool. No brbpr. No abdominal pain. Occasional melena. Occasional Pepto-Bismol use but denies any relation to black stools. Vomiting started Saturday. Last time vomited was yesterday. No heartburn on prilosec. Appetite has been good. No weight loss.    Prior to Admission medications   Medication Sig Start Date End Date Taking? Authorizing Provider  alendronate (FOSAMAX) 70 MG tablet Take 70 mg by mouth every 7 (seven) days. Take with a full glass of water on an empty stomach. On Wednesdays   Yes Historical Provider, MD  aspirin EC 81 MG tablet Take 81 mg by mouth daily.   Yes Historical Provider, MD  loperamide (IMODIUM A-D) 2 MG tablet Take 2 mg by mouth as needed.   Yes Historical Provider, MD  meclizine (ANTIVERT) 12.5 MG tablet Take 12.5 mg by mouth 3 (three) times daily as needed for dizziness.   Yes Historical Provider, MD  metFORMIN (GLUCOPHAGE) 500 MG tablet Take 1,000 mg by mouth 2 (two) times daily with a meal.    Yes Historical Provider, MD  metoprolol (LOPRESSOR) 50 MG tablet Take 50 mg by mouth 2 (two) times daily.   Yes Historical Provider, MD  omeprazole (PRILOSEC) 20 MG capsule Take 20 mg by mouth Daily. 08/05/12  Yes Historical Provider, MD  ondansetron (ZOFRAN) 8 MG tablet Take 1 tablet (8 mg total) by mouth every 4 (four) hours as needed for nausea or vomiting. 09/01/13  Yes Donnetta Hutching, MD    Current Facility-Administered Medications  Medication Dose Route Frequency Provider Last Rate Last Dose  . 0.9 %  sodium chloride infusion   Intravenous Continuous  Standley Brooking, MD 75 mL/hr at 09/03/13 2111    . acetaminophen (TYLENOL) tablet 650 mg  650 mg Oral Q6H PRN Standley Brooking, MD       Or  . acetaminophen (TYLENOL) suppository 650 mg  650 mg Rectal Q6H PRN Standley Brooking, MD      . aspirin EC tablet 81 mg  81 mg Oral Daily Standley Brooking, MD      . insulin aspart  (novoLOG) injection 0-9 Units  0-9 Units Subcutaneous Q6H Standley Brooking, MD      . meclizine (ANTIVERT) tablet 12.5 mg  12.5 mg Oral TID PRN Standley Brooking, MD      . metoprolol (LOPRESSOR) tablet 50 mg  50 mg Oral BID Standley Brooking, MD   50 mg at 09/03/13 2147  . ondansetron (ZOFRAN) tablet 4 mg  4 mg Oral Q6H PRN Standley Brooking, MD       Or  . ondansetron [facility]) injection 4 mg  4 mg Intravenous Q6H PRN Standley Brooking, MD      . pantoprazole (PROTONIX) EC tablet 40 mg  40 mg Oral Daily Standley Brooking, MD        Allergies as of 09/03/2013  . (No Known Allergies)    Past Medical History  Diagnosis Date  . Degenerative joint disease     Right THA in 1995; cervical discectomy and fusion-2001  . Diabetes mellitus, type 2     + neuropathy  . Hypertension   . Gastroesophageal reflux disease   . Aortic sclerosis     Long-standing heart murmur  . Emphysema   . Anxiety   . Sigmoid diverticulosis   . Hyperlipidemia   . Obesity   . Cholelithiasis     on u/s 09/2011  . Osteoporosis   . Chest pain 2006    +palpitations; minimal CAD at cath in 2005; normal EF; negative stress echo in 09/2008  . IDA (iron deficiency anemia)     labs 09/2011  . Hiatal hernia   . Small bowel lesion     On Given's capsule; Dr Sharin Mons 07/2012, FU 09/2012    Past Surgical History  Procedure Laterality Date  . Anterior cervical discectomy  2001    C4-5, allograft, fixation  . Tonsillectomy    . Total hip arthroplasty  1995    Right  . Colonoscopy  2006  . Cataract extraction w/ intraocular lens implant  2007    Left  . Breast lumpectomy      benign  . Colonoscopy  04/03/12    Dr. Merlyn Lot diverticulosis, negative microscopic  colitis   . Esophagogastroduodenoscopy  04/03/12    Dr. Dot Been hernia, chronic gastritis on bx  . Givens capsule study  05/09/2012    RMR: an unclear raised area of small bowel was noted, with features almost  characteristic of very small  polyp. This was without villous  blunting or any evidence of active bleeding; yet, the area of  concern appeared to be erythematous. However, this could simply  be a light reflection on a normal variation of the small bowel. REFERRED TO DR. GILLIAM, APPT FOR NOVEMBER 2013.     Family History  Problem Relation Age of Onset  . Cancer Brother     lung, age 31  . Diabetes Mother     + brother  . Heart failure Father   . Hypertension Father     + brother  . Colon cancer  Neg Hx   . Uterine cancer Sister     History   Social History  . Marital Status: Divorced    Spouse Name: N/A    Number of Children: 4  . Years of Education: N/A   Occupational History  . Retired     SNF   Social History Main Topics  . Smoking status: Former Smoker -- 1.00 packs/day for 30 years    Types: Cigarettes    Quit date: 10/15/1970  . Smokeless tobacco: Former Neurosurgeon    Quit date: 09/19/1971     Comment: Quit x 50 years  . Alcohol Use: No  . Drug Use: No  . Sexual Activity: No   Other Topics Concern  . Not on file   Social History Narrative  . No narrative on file     ROS:  General: Negative for anorexia, weight loss, fever, chills, fatigue, weakness. Eyes: Negative for vision changes.  ENT: Negative for hoarseness, difficulty swallowing , nasal congestion. CV: Negative for chest pain, angina, palpitations, dyspnea on exertion, peripheral edema.  Respiratory: Negative for dyspnea at rest, dyspnea on exertion, cough, sputum, wheezing.  GI: See history of present illness. GU:  Negative for dysuria, hematuria, urinary incontinence, urinary frequency, nocturnal urination.  MS: Negative for joint pain, low back pain.  Derm: Negative for rash or itching.  Neuro: Negative for weakness, abnormal sensation, seizure, frequent headaches, memory loss, confusion.  Psych: Negative for anxiety, depression, suicidal ideation, hallucinations.  Endo: Negative for unusual weight change.  Heme: Negative for  bruising or bleeding. Allergy: Negative for rash or hives.       Physical Examination: Vital signs in last 24 hours: Temp:  [97.6 F (36.4 C)-98.6 F (37 C)] 98.1 F (36.7 C) (12/18 0449) Pulse Rate:  [52-63] 56 (12/18 0452) Resp:  [16-18] 18 (12/18 0449) BP: (107-192)/(37-71) 126/60 mmHg (12/18 0452) SpO2:  [94 %-98 %] 97 % (12/18 0449) Weight:  [182 lb (82.555 kg)] 182 lb (82.555 kg) (12/17 1324) Last BM Date: 09/03/13  General: Well-nourished, well-developed in no acute distress.  Head: Normocephalic, atraumatic.   Eyes: Conjunctiva pink, no icterus. Mouth: Oropharyngeal mucosa moist and pink , no lesions erythema or exudate. Neck: Supple without thyromegaly, masses, or lymphadenopathy.  Lungs: Clear to auscultation bilaterally.  Heart: Regular rate and rhythm, no murmurs rubs or gallops.  Abdomen: Bowel sounds are normal, mild diffuse tenderness, nondistended, no hepatosplenomegaly or masses, no abdominal bruits or hernia , no rebound or guarding.   Rectal: not performed Extremities: No lower extremity edema, clubbing, deformity.  Neuro: Alert and oriented x 4 , grossly normal neurologically.  Skin: Warm and dry, no rash or jaundice.   Psych: Alert and cooperative, normal mood and affect.        Intake/Output from previous day: 12/17 0701 - 12/18 0700 In: 826.3 [P.O.:240; I.V.:586.3] Out: -  Intake/Output this shift:    Lab Results: CBC  Recent Labs  09/01/13 1114 09/03/13 1419 09/04/13 0520  WBC 9.9 8.8 7.8  HGB 11.4* 10.9* 9.8*  HCT 35.4* 34.1* 30.1*  MCV 87.0 88.1 87.8  PLT 388 337 313   BMET  Recent Labs  09/01/13 1114 09/03/13 1419 09/04/13 0520  NA 137 139 139  K 3.7 3.5 3.3*  CL 98 102 104  CO2 22 23 25   GLUCOSE 142* 139* 122*  BUN 13 11 8   CREATININE 0.88 0.90 0.94  CALCIUM 9.5 9.6 8.5   LFT  Recent Labs  09/03/13 1419 09/04/13 0520  BILITOT  0.4 0.7  ALKPHOS 46 38*  AST 52* 42*  ALT 47* 39*  PROT 7.3 6.3  ALBUMIN 3.6 3.0*    Lab Results  Component Value Date   LIPASE 21 09/03/2013    PT/INR )No results found for this basename: LABPROT, INR,  in the last 72 hours    Imaging Studies: Ct Abdomen Pelvis W Contrast  09/03/2013   CLINICAL DATA:  Nausea, vomiting, diarrhea dx  EXAM: CT ABDOMEN AND PELVIS WITH CONTRAST  TECHNIQUE: Multidetector CT imaging of the abdomen and pelvis was performed using the standard protocol following bolus administration of intravenous contrast.  CONTRAST:  50mL OMNIPAQUE IOHEXOL 300 MG/ML SOLN, OMNIPAQUE IOHEXOL 300 MG/ML SOLN  COMPARISON:  None.  FINDINGS: Hypoventilation is appreciated within the lung bases.  There is mild diffuse low attenuation throughout the liver parenchyma. Small punctate calcification projects within the anterior base of the liver.  The spleen, right adrenal, pancreas are unremarkable. A lobulated nodule is identified involving the left adrenal measuring 2.5 x 1 cm and maximal orthogonal dimensions image number 26 series 2. The Hounsfield units of this finding are greater than 10. This likely represents an adrenal adenoma. Surveillance evaluation in 6-12 months recommended. Evaluate kidneys demonstrate bilateral extrarenal pelvises right greater than left. There is no evidence of hydronephrosis no hydroureter. Bilateral renal cyst are identified left greater than right.  Evaluate and pelvis demonstrates a mass within the right adnexa measuring 4.2 x 2 cm. This area demonstrates a solid and cystic component. The cystic component measures 3.2 cm by 2.5 cm image 57 series 2 with Hounsfield units of -3. These findings likely represent an ovarian remnant with a cystic component. Punctate calcifications identified within the soft tissue component. Further evaluation with pelvic ultrasound is recommended. Evaluation of pelvis is degraded by beam hardening artifact due to a right hip arthroplasty. Diverticulosis appreciated within the sigmoid colon without evidence of  diverticulitis. There is no evidence of bowel obstruction, enteritis or colitis. The appendix is identified and is unremarkable.  Evaluation of the cecum demonstrates an irregularly bordered heterogeneously attenuating area within the base of the cecum. Image 55 series 2. The cecum is poorly opacified with contrast. Differential considerations are the sequela of stool within the cecum. Due to the irregularity of the border of this finding a mass cannot be completely excluded. Repeat delayed imaging of the lower abdomen and upper pelvis may help to further characterize this finding. If this finding persists further evaluation with GI consultation recommended  No further abdominal or pelvic masses, free fluid or loculated fluid collections are appreciated.  There is no evidence of abdominal aortic aneurysm. Atherosclerotic calcifications are appreciated within the abdominal aorta. The celiac, SMA, IMA, SMV, portal vein are opacified. There is decreased opacification in the aorta and mesenteric arteries.  IMPRESSION: Small mass with soft tissue and cystic components in the right adnexal region. Punctate calcifications are appreciated within this mass. Further evaluation pelvic ultrasound is recommended. Differential considerations are an ovarian remnant with possibly a benign cyst.  All indeterminate finding within the base of the cecum. Re evaluation with delayed imaging 45-90 min, CT, is recommended to further characterize this region as described above.  Likely adenoma involving the left adrenal surveillance evaluation recommended.   Electronically Signed   By: Salome Holmes M.D.   On: 09/03/2013 16:01  [4 week]   Impression: 79 year old lady who presents with acute onset nausea and vomiting for 5 days duration. Evaluate in the emergency department 2 days prior  to admission but treated symptomatically went home. At this point she's had no further vomiting since admission. She complains of chronic diarrhea for  couple of years. Evaluated in July 2013 with biopsies negative for microscopic colitis at that time. Small bowel biopsies were negative for celiac disease.  Acute onset nausea vomiting, minimally elevated transaminases. She has a history of gallstone on ultrasound in January 2013. Cannot exclude possibility of biliary etiology. Differential also includes viral gastroenteritis.  With regards to chronic diarrhea, she denies any significant change in her stools. Requires use of a couple of Imodium daily. Never has a solid stool. Would recommend rule out infectious etiology superimposed on chronic issue. Indeterminate finding at the base of the cecum on CT yesterday, possibly artifact.  Plan: 1. Stool studies. 2. Anemia panel. 3. LFTs, CBC tomorrow. 4. To discussed CT findings with Dr. Jena Gauss. Do not suspect that she would need to have urgent colonoscopy for evaluation given colonoscopy 18 months ago. At this time she may be difficult to prep given recent nausea vomiting. Further recommendations to follow. 5. Per radiology, needs repeat CT in 6-12 months regarding adrenal lesion.   LOS: 1 day   Tana Coast  09/04/2013, 9:21 AM  Attending note:  Agree with above assessment and plan. I have reviewed the last couple CTs with Dr. Rosalene Billings, our radiologist today.  Essentially no contrast in the colon. Abnormality in  the cecum is likely not abnormal.  More likely, just stool and fluid in an un-opacified colon. Just to cover all bases, it is recommended the patient have a single contrast barium enema as an outpatient at a later date. Reviewed CT findings with multiple family members.  Evaluation of right adnexal mass per attending. Once over acute illness would entertain the possibility of pancreatic exocrine deficiency further as a potential cause of chronic diarrhea with stool elastase assay +/-  trial of pancreatic enzymes.

## 2013-09-05 LAB — CBC
HCT: 31.4 % — ABNORMAL LOW (ref 36.0–46.0)
Hemoglobin: 10.1 g/dL — ABNORMAL LOW (ref 12.0–15.0)
MCH: 28.2 pg (ref 26.0–34.0)
MCHC: 32.2 g/dL (ref 30.0–36.0)
MCV: 87.7 fL (ref 78.0–100.0)
Platelets: 323 10*3/uL (ref 150–400)
RBC: 3.58 MIL/uL — ABNORMAL LOW (ref 3.87–5.11)
RDW: 14.8 % (ref 11.5–15.5)
WBC: 7.6 10*3/uL (ref 4.0–10.5)

## 2013-09-05 LAB — GLUCOSE, CAPILLARY
Glucose-Capillary: 118 mg/dL — ABNORMAL HIGH (ref 70–99)
Glucose-Capillary: 177 mg/dL — ABNORMAL HIGH (ref 70–99)
Glucose-Capillary: 130 mg/dL — ABNORMAL HIGH (ref 70–99)

## 2013-09-05 LAB — COMPREHENSIVE METABOLIC PANEL
ALT: 43 U/L — ABNORMAL HIGH (ref 0–35)
AST: 41 U/L — ABNORMAL HIGH (ref 0–37)
Albumin: 3.1 g/dL — ABNORMAL LOW (ref 3.5–5.2)
Alkaline Phosphatase: 43 U/L (ref 39–117)
BUN: 6 mg/dL (ref 6–23)
CO2: 26 mEq/L (ref 19–32)
Calcium: 8.7 mg/dL (ref 8.4–10.5)
Chloride: 105 mEq/L (ref 96–112)
Creatinine, Ser: 0.8 mg/dL (ref 0.50–1.10)
GFR calc Af Amer: 74 mL/min — ABNORMAL LOW (ref >90)
GFR calc non Af Amer: 64 mL/min — ABNORMAL LOW (ref >90)
Glucose, Bld: 127 mg/dL — ABNORMAL HIGH (ref 70–99)
Potassium: 3.4 mEq/L — ABNORMAL LOW (ref 3.5–5.1)
Sodium: 139 mEq/L (ref 135–145)
Total Bilirubin: 0.8 mg/dL (ref 0.3–1.2)
Total Protein: 6.4 g/dL (ref 6.0–8.3)

## 2013-09-05 LAB — CLOSTRIDIUM DIFFICILE BY PCR: Toxigenic C. Difficile by PCR: NEGATIVE

## 2013-09-05 LAB — FOLATE RBC: RBC Folate: 883 ng/mL — ABNORMAL HIGH (ref >280)

## 2013-09-05 MED ORDER — LOSARTAN POTASSIUM 25 MG PO TABS: 25.0000 mg | ORAL_TABLET | Freq: Every day | ORAL | Status: DC

## 2013-09-05 NOTE — Progress Notes (Signed)
09/05/13 1044 Reviewed discharge instructions with patient. Family member at bedside. Given copy of instructions, medication list, f/u appointment as scheduled. Prescription called in to [facility] per MD. Pt aware. Pt verbalized understanding of instructions, when to call MD. Denies pain, no nausea/vomiting prior to discharge. IV site d/c'd per nurse tech, site within normal limits. Pt left floor in stable condition via w/c accompanied by nurse tech. Earnstine Regal, RN

## 2013-09-05 NOTE — Progress Notes (Signed)
09/05/13 1042 Patient had small loose stool. Specimen obtained and sent to lab for C-diff PCR, GI pathogen PCR, stool culture as ordered. Pt aware she can receive results from dr Sudie Bailey office on follow-up visit. Earnstine Regal, RN

## 2013-09-06 NOTE — Discharge Summary (Signed)
NAMEMARIELL, Denise Parrish            ACCOUNT NO.:  [id number]  MEDICAL RECORD NO.:  [id number]  LOCATION:  A312                          FACILITY:  [facility]  PHYSICIAN:  Mila Homer. Sudie Bailey, M.D.DATE OF BIRTH:  26-May-1923  DATE OF ADMISSION:  09/03/2013 DATE OF DISCHARGE:  12/19/2014LH                              DISCHARGE SUMMARY   HOSPITAL COURSE:  This 79 year old woman presented to the hospital with recurrent vomiting.  She has also had chronic diarrhea.  She had a benign 3-day hospitalization extending from December 17 to September 05, 2013.  Her vital signs remained stable.  Her admission white cell count was 8800, hemoglobin 10.9.  This stayed stable during the hospitalization.  Differential was also normal.  Her chemistries were also essentially normal except for mild elevation of AST and ALT.  The anemia study showed an iron level 47 with an iron saturation of 13%, TIBC of 362, and a ferritin of 17.  Her vitamin B12 level was 420.  CT scan of the abdomen and pelvis showed a small mass with soft tissue and cystic component at the right adnexal region.  There were punctate calcifications in this as well.  It was felt this is probably an ovarian remnant or a benign cyst.  There is a probable adenoma of the left adrenal gland.  Pelvic ultrasound was difficult due to overlying gas.  There was no abnormality but the technique was poor because of the above and transvaginal approach was suggested to be done at a later time.  Her UA was negative.  She was admitted to the hospital.  She was put on sliding scale insulin, meclizine p.r.n. dizziness, Toprol 50 mg b.i.d. for blood pressure, pantoprazole 40 mg daily for GERD, zolpidem for sleep.  She was seen by GI, Dr. Jena Gauss.  Since she has had recent studies on her upper GI system with EGD, colonoscopy, and a small bowel was felt that we might not benefit much from further studies at this point.  Dr. Jena Gauss reviewed all x-rays  and thought other possibilities such as pancreatic exocrine insufficiency might be investigated at a later date.  By her third day her nausea and vomiting was stable and she was ready for discharge home.  She was discharged home on alendronate 70 mg q. week, aspirin 81 mg daily, loperamide 2 mg p.r.n., meclizine 12.5 mg t.i.d. p.r.n. dizziness, metformin 500 mg 2 tablets b.i.d., omeprazole 20 mg daily, and ondansetron 8 mg q.4 hours for nausea or vomiting.  I suggested that she stop metoprolol and switch to losartan 25 mg daily for her hypertension since beta blockers do occasionally cause chronic diarrhea.  She is due to see me back in 1 week.  If she has had no benefit on this we will reinstitute metoprolol.  Other agents that might cause chronic diarrhea  include omeprazole, metformin, and alendronate.  FINAL DISCHARGE DIAGNOSES: 1. Nausea and vomiting, uncertain etiology. 2. Chronic diarrhea with negative GI workup. 3. Iron deficiency anemia.  At present, I think, that this is not because     of loss of iron but rather because of poor iron absorption. 4. Benign essential hypertension. 5. Probable adrenal adenoma. 6. Abnormality of the right  ovary, question etiology. 7. Type 2 diabetes.  At her visit in a week, we will arrange for a transvaginal ultrasound to evaluate the right ovarian abnormality.  We will also consider CT scan of the adrenals again in another 6 months or so.  Medication adjustment will be done at that time.     Mila Homer. Sudie Bailey, M.D.     SDK/MEDQ  D:  09/05/2013  T:  09/06/2013  Job:  [id number]

## 2013-09-08 LAB — GI PATHOGEN PANEL BY PCR, STOOL
C difficile toxin A/B: NEGATIVE
Campylobacter by PCR: NEGATIVE
Cryptosporidium by PCR: NEGATIVE
E coli (ETEC) LT/ST: NEGATIVE
E coli (STEC): NEGATIVE
E coli 0157 by PCR: NEGATIVE
G lamblia by PCR: NEGATIVE
Norovirus GI/GII: NEGATIVE
Rotavirus A by PCR: NEGATIVE
Salmonella by PCR: NEGATIVE
Shigella by PCR: NEGATIVE

## 2013-09-09 LAB — STOOL CULTURE

## 2013-09-15 ENCOUNTER — Other Ambulatory Visit (HOSPITAL_COMMUNITY): Payer: Self-pay | Admitting: Family Medicine

## 2013-09-16 ENCOUNTER — Other Ambulatory Visit (HOSPITAL_COMMUNITY): Payer: Self-pay | Admitting: Family Medicine

## 2013-09-16 DIAGNOSIS — R1900 Intra-abdominal and pelvic swelling, mass and lump, unspecified site: Secondary | ICD-10-CM

## 2013-09-17 ENCOUNTER — Other Ambulatory Visit (HOSPITAL_COMMUNITY): Payer: PRIVATE HEALTH INSURANCE

## 2013-09-17 ENCOUNTER — Ambulatory Visit (HOSPITAL_COMMUNITY)
Admission: RE | Admit: 2013-09-17 | Discharge: 2013-09-17 | Disposition: A | Discharge status: Home / Self Care | Payer: PRIVATE HEALTH INSURANCE | Source: Ambulatory Visit | Attending: Family Medicine | Admitting: Family Medicine

## 2013-09-17 DIAGNOSIS — R1900 Intra-abdominal and pelvic swelling, mass and lump, unspecified site: Secondary | ICD-10-CM

## 2013-09-17 DIAGNOSIS — N838 Other noninflammatory disorders of ovary, fallopian tube and broad ligament: Secondary | ICD-10-CM | POA: Insufficient documentation

## 2013-09-25 ENCOUNTER — Telehealth: Payer: Self-pay | Admitting: Gastroenterology

## 2013-09-25 NOTE — Telephone Encounter (Signed)
Patient needs E30 hospital follow-up in 4-6 weeks to arrange for single contrast barium enema and w/u of chronic diarrhea (?pancreatic insufficiency).

## 2013-09-25 NOTE — Telephone Encounter (Signed)
Pt's appointment is 11/05/13 at 2:30 with [facility]. Pt is aware, appointment card mailed also.

## 2013-09-26 ENCOUNTER — Ambulatory Visit
Admission: RE | Admit: 2013-09-26 | Discharge: 2013-09-26 | Disposition: A | Discharge status: Home / Self Care | Payer: PRIVATE HEALTH INSURANCE | Source: Ambulatory Visit | Attending: Family Medicine | Admitting: Family Medicine

## 2013-09-26 ENCOUNTER — Other Ambulatory Visit: Payer: Self-pay | Admitting: Family Medicine

## 2013-09-26 DIAGNOSIS — R1900 Intra-abdominal and pelvic swelling, mass and lump, unspecified site: Secondary | ICD-10-CM

## 2013-09-26 MED ORDER — GADOBENATE DIMEGLUMINE 529 MG/ML IV SOLN
20.0000 mL | Freq: Once | INTRAVENOUS | Status: AC | PRN
  Administered 2013-09-26: 20 mL via INTRAVENOUS

## 2013-11-05 ENCOUNTER — Ambulatory Visit: Payer: PRIVATE HEALTH INSURANCE | Admitting: Gastroenterology

## 2014-07-08 ENCOUNTER — Other Ambulatory Visit (HOSPITAL_COMMUNITY): Payer: Self-pay | Admitting: Family Medicine

## 2014-07-08 ENCOUNTER — Ambulatory Visit (HOSPITAL_COMMUNITY)
Admission: RE | Admit: 2014-07-08 | Discharge: 2014-07-08 | Disposition: A | Discharge status: Home / Self Care | Payer: PRIVATE HEALTH INSURANCE | Source: Ambulatory Visit | Attending: Family Medicine | Admitting: Family Medicine

## 2014-07-08 ENCOUNTER — Other Ambulatory Visit (HOSPITAL_COMMUNITY): Payer: Self-pay | Admitting: Respiratory Therapy

## 2014-07-08 DIAGNOSIS — R0989 Other specified symptoms and signs involving the circulatory and respiratory systems: Secondary | ICD-10-CM

## 2014-07-08 DIAGNOSIS — R05 Cough: Secondary | ICD-10-CM

## 2014-07-08 DIAGNOSIS — I10 Essential (primary) hypertension: Secondary | ICD-10-CM

## 2014-07-08 DIAGNOSIS — R0602 Shortness of breath: Secondary | ICD-10-CM

## 2014-07-08 DIAGNOSIS — Z87891 Personal history of nicotine dependence: Secondary | ICD-10-CM

## 2014-07-08 DIAGNOSIS — E119 Type 2 diabetes mellitus without complications: Secondary | ICD-10-CM | POA: Insufficient documentation

## 2014-07-08 LAB — BLOOD GAS, ARTERIAL
Acid-base deficit: 1.8 mmol/L (ref 0.0–2.0)
Bicarbonate: 22.3 meq/L (ref 20.0–24.0)
Drawn by: 25788
O2 Saturation: 93.4 %
Patient temperature: 37
TCO2: 20.2 mmol/L (ref 0–100)
pCO2 arterial: 37 mmHg (ref 35.0–45.0)
pH, Arterial: 7.398 (ref 7.350–7.450)
pO2, Arterial: 81.2 mmHg (ref 80.0–100.0)

## 2014-07-20 ENCOUNTER — Other Ambulatory Visit (HOSPITAL_COMMUNITY): Payer: Self-pay | Admitting: Respiratory Therapy

## 2014-07-20 DIAGNOSIS — R0602 Shortness of breath: Secondary | ICD-10-CM

## 2014-08-11 ENCOUNTER — Ambulatory Visit (HOSPITAL_COMMUNITY)
Admission: RE | Admit: 2014-08-11 | Discharge: 2014-08-11 | Disposition: A | Discharge status: Home / Self Care | Payer: PRIVATE HEALTH INSURANCE | Source: Ambulatory Visit | Attending: Family Medicine | Admitting: Family Medicine

## 2014-08-11 DIAGNOSIS — R0602 Shortness of breath: Secondary | ICD-10-CM | POA: Insufficient documentation

## 2014-08-11 MED ORDER — ALBUTEROL SULFATE (2.5 MG/3ML) 0.083% IN NEBU
2.5000 mg | INHALATION_SOLUTION | Freq: Once | RESPIRATORY_TRACT | Status: AC
  Administered 2014-08-11: 2.5 mg via RESPIRATORY_TRACT

## 2014-08-16 LAB — PULMONARY FUNCTION TEST
DL/VA % pred: 80 %
DL/VA: 3.95 ml/min/mmHg/L
DLCO cor % pred: 59 %
DLCO cor: 15.2 ml/min/mmHg
DLCO unc % pred: 59 %
DLCO unc: 15.2 ml/min/mmHg
FEF 25-75 Post: 1.4 L/sec
FEF 25-75 Pre: 0.77 L/sec
FEF2575-%Change-Post: 81 %
FEF2575-%Pred-Post: 146 %
FEF2575-%Pred-Pre: 81 %
FEV1-%Change-Post: 18 %
FEV1-%Pred-Post: 105 %
FEV1-%Pred-Pre: 89 %
FEV1-Post: 1.77 L
FEV1-Pre: 1.5 L
FEV1FVC-%Change-Post: 8 %
FEV1FVC-%Pred-Pre: 88 %
FEV6-%Change-Post: 10 %
FEV6-%Pred-Post: 120 %
FEV6-%Pred-Pre: 109 %
FEV6-Post: 2.57 L
FEV6-Pre: 2.33 L
FEV6FVC-%Change-Post: 0 %
FEV6FVC-%Pred-Post: 107 %
FEV6FVC-%Pred-Pre: 106 %
FVC-%Change-Post: 9 %
FVC-%Pred-Post: 112 %
FVC-%Pred-Pre: 103 %
FVC-Post: 2.57 L
FVC-Pre: 2.35 L
Post FEV1/FVC ratio: 69 %
Post FEV6/FVC ratio: 100 %
Pre FEV1/FVC ratio: 64 %
Pre FEV6/FVC Ratio: 99 %
RV % pred: 96 %
RV: 2.54 L
TLC % pred: 93 %
TLC: 4.85 L

## 2015-05-23 ENCOUNTER — Encounter (HOSPITAL_COMMUNITY): Payer: Self-pay | Admitting: *Deleted

## 2015-05-23 ENCOUNTER — Inpatient Hospital Stay (HOSPITAL_COMMUNITY)
Admission: EM | Admit: 2015-05-23 | Discharge: 2015-05-25 | DRG: 390 | Disposition: A | Discharge status: Home / Self Care | Payer: Medicare Other | Attending: Internal Medicine | Admitting: Internal Medicine

## 2015-05-23 ENCOUNTER — Emergency Department (HOSPITAL_COMMUNITY): Payer: Medicare Other

## 2015-05-23 DIAGNOSIS — M1990 Unspecified osteoarthritis, unspecified site: Secondary | ICD-10-CM | POA: Diagnosis present

## 2015-05-23 DIAGNOSIS — Z96641 Presence of right artificial hip joint: Secondary | ICD-10-CM | POA: Diagnosis present

## 2015-05-23 DIAGNOSIS — K5660 Unspecified intestinal obstruction: Secondary | ICD-10-CM | POA: Diagnosis not present

## 2015-05-23 DIAGNOSIS — Z833 Family history of diabetes mellitus: Secondary | ICD-10-CM

## 2015-05-23 DIAGNOSIS — R112 Nausea with vomiting, unspecified: Secondary | ICD-10-CM | POA: Diagnosis not present

## 2015-05-23 DIAGNOSIS — I700 Atherosclerosis of aorta: Secondary | ICD-10-CM | POA: Diagnosis present

## 2015-05-23 DIAGNOSIS — R1110 Vomiting, unspecified: Secondary | ICD-10-CM | POA: Diagnosis present

## 2015-05-23 DIAGNOSIS — Z8249 Family history of ischemic heart disease and other diseases of the circulatory system: Secondary | ICD-10-CM

## 2015-05-23 DIAGNOSIS — I10 Essential (primary) hypertension: Secondary | ICD-10-CM | POA: Diagnosis present

## 2015-05-23 DIAGNOSIS — E119 Type 2 diabetes mellitus without complications: Secondary | ICD-10-CM

## 2015-05-23 DIAGNOSIS — E1140 Type 2 diabetes mellitus with diabetic neuropathy, unspecified: Secondary | ICD-10-CM | POA: Diagnosis present

## 2015-05-23 DIAGNOSIS — R110 Nausea: Secondary | ICD-10-CM

## 2015-05-23 DIAGNOSIS — D509 Iron deficiency anemia, unspecified: Secondary | ICD-10-CM | POA: Diagnosis present

## 2015-05-23 DIAGNOSIS — K219 Gastro-esophageal reflux disease without esophagitis: Secondary | ICD-10-CM | POA: Diagnosis present

## 2015-05-23 DIAGNOSIS — K5730 Diverticulosis of large intestine without perforation or abscess without bleeding: Secondary | ICD-10-CM | POA: Diagnosis present

## 2015-05-23 DIAGNOSIS — M810 Age-related osteoporosis without current pathological fracture: Secondary | ICD-10-CM | POA: Diagnosis present

## 2015-05-23 DIAGNOSIS — E785 Hyperlipidemia, unspecified: Secondary | ICD-10-CM | POA: Diagnosis present

## 2015-05-23 DIAGNOSIS — Z809 Family history of malignant neoplasm, unspecified: Secondary | ICD-10-CM

## 2015-05-23 DIAGNOSIS — Z87891 Personal history of nicotine dependence: Secondary | ICD-10-CM

## 2015-05-23 DIAGNOSIS — K5669 Other intestinal obstruction: Secondary | ICD-10-CM | POA: Diagnosis not present

## 2015-05-23 DIAGNOSIS — K56609 Unspecified intestinal obstruction, unspecified as to partial versus complete obstruction: Secondary | ICD-10-CM | POA: Diagnosis present

## 2015-05-23 DIAGNOSIS — IMO0001 Reserved for inherently not codable concepts without codable children: Secondary | ICD-10-CM | POA: Diagnosis present

## 2015-05-23 LAB — URINALYSIS, ROUTINE W REFLEX MICROSCOPIC
Bilirubin Urine: NEGATIVE
Glucose, UA: NEGATIVE mg/dL
Hgb urine dipstick: NEGATIVE
Ketones, ur: 15 mg/dL — AB
Leukocytes, UA: NEGATIVE
Nitrite: NEGATIVE
Specific Gravity, Urine: 1.01 (ref 1.005–1.030)
Urobilinogen, UA: 1 mg/dL (ref 0.0–1.0)
pH: 8.5 — ABNORMAL HIGH (ref 5.0–8.0)

## 2015-05-23 LAB — CBC
HCT: 37.8 % (ref 36.0–46.0)
Hemoglobin: 12.6 g/dL (ref 12.0–15.0)
MCH: 30.2 pg (ref 26.0–34.0)
MCHC: 33.3 g/dL (ref 30.0–36.0)
MCV: 90.6 fL (ref 78.0–100.0)
Platelets: 253 10*3/uL (ref 150–400)
RBC: 4.17 MIL/uL (ref 3.87–5.11)
RDW: 13.9 % (ref 11.5–15.5)
WBC: 7.4 10*3/uL (ref 4.0–10.5)

## 2015-05-23 LAB — GLUCOSE, CAPILLARY: Glucose-Capillary: 147 mg/dL — ABNORMAL HIGH (ref 65–99)

## 2015-05-23 LAB — TROPONIN I: Troponin I: <0.03 ng/mL (ref <0.031)

## 2015-05-23 LAB — CBC WITH DIFFERENTIAL/PLATELET
Basophils Absolute: 0 10*3/uL (ref 0.0–0.1)
Basophils Relative: 0 % (ref 0–1)
Eosinophils Absolute: 0.1 10*3/uL (ref 0.0–0.7)
Eosinophils Relative: 1 % (ref 0–5)
HCT: 41 % (ref 36.0–46.0)
Hemoglobin: 13.8 g/dL (ref 12.0–15.0)
Lymphocytes Relative: 21 % (ref 12–46)
Lymphs Abs: 2.4 10*3/uL (ref 0.7–4.0)
MCH: 30.3 pg (ref 26.0–34.0)
MCHC: 33.7 g/dL (ref 30.0–36.0)
MCV: 90.1 fL (ref 78.0–100.0)
Monocytes Absolute: 0.9 10*3/uL (ref 0.1–1.0)
Monocytes Relative: 8 % (ref 3–12)
Neutro Abs: 8.2 10*3/uL — ABNORMAL HIGH (ref 1.7–7.7)
Neutrophils Relative %: 70 % (ref 43–77)
Platelets: 298 10*3/uL (ref 150–400)
RBC: 4.55 MIL/uL (ref 3.87–5.11)
RDW: 13.9 % (ref 11.5–15.5)
WBC: 11.6 10*3/uL — ABNORMAL HIGH (ref 4.0–10.5)

## 2015-05-23 LAB — COMPREHENSIVE METABOLIC PANEL
ALT: 32 U/L (ref 14–54)
AST: 34 U/L (ref 15–41)
Albumin: 3.8 g/dL (ref 3.5–5.0)
Alkaline Phosphatase: 83 U/L (ref 38–126)
Anion gap: 13 (ref 5–15)
BUN: 18 mg/dL (ref 6–20)
CO2: 28 mmol/L (ref 22–32)
Calcium: 9.5 mg/dL (ref 8.9–10.3)
Chloride: 101 mmol/L (ref 101–111)
Creatinine, Ser: 1.09 mg/dL — ABNORMAL HIGH (ref 0.44–1.00)
GFR calc Af Amer: 50 mL/min — ABNORMAL LOW (ref >60)
GFR calc non Af Amer: 43 mL/min — ABNORMAL LOW (ref >60)
Glucose, Bld: 161 mg/dL — ABNORMAL HIGH (ref 65–99)
Potassium: 3.7 mmol/L (ref 3.5–5.1)
Sodium: 142 mmol/L (ref 135–145)
Total Bilirubin: 1.4 mg/dL — ABNORMAL HIGH (ref 0.3–1.2)
Total Protein: 7.4 g/dL (ref 6.5–8.1)

## 2015-05-23 LAB — CREATININE, SERUM
Creatinine, Ser: 0.98 mg/dL (ref 0.44–1.00)
GFR calc Af Amer: 57 mL/min — ABNORMAL LOW (ref >60)
GFR calc non Af Amer: 49 mL/min — ABNORMAL LOW (ref >60)

## 2015-05-23 LAB — I-STAT CG4 LACTIC ACID, ED: Lactic Acid, Venous: 2.26 mmol/L (ref 0.5–2.0)

## 2015-05-23 LAB — MAGNESIUM: Magnesium: 1.8 mg/dL (ref 1.7–2.4)

## 2015-05-23 LAB — URINE MICROSCOPIC-ADD ON

## 2015-05-23 LAB — LACTIC ACID, PLASMA
Lactic Acid, Venous: 2.3 mmol/L (ref 0.5–2.0)
Lactic Acid, Venous: 1.8 mmol/L (ref 0.5–2.0)
Lactic Acid, Venous: 1.1 mmol/L (ref 0.5–2.0)

## 2015-05-23 LAB — LIPASE, BLOOD: Lipase: 20 U/L — ABNORMAL LOW (ref 22–51)

## 2015-05-23 MED ORDER — ONDANSETRON HCL 4 MG PO TABS: 4.0000 mg | ORAL_TABLET | Freq: Four times a day (QID) | ORAL | Status: DC | PRN

## 2015-05-23 MED ORDER — SODIUM CHLORIDE 0.9 % IV BOLUS (SEPSIS)
500.0000 mL | Freq: Once | INTRAVENOUS | Status: AC
  Administered 2015-05-23: 500 mL via INTRAVENOUS

## 2015-05-23 MED ORDER — ONDANSETRON HCL 4 MG/2ML IJ SOLN: 4.0000 mg | Freq: Four times a day (QID) | INTRAMUSCULAR | Status: DC | PRN

## 2015-05-23 MED ORDER — SODIUM CHLORIDE 0.9 % IV SOLN
INTRAVENOUS | Status: DC
  Administered 2015-05-23: 17:00:00 via INTRAVENOUS

## 2015-05-23 MED ORDER — IOHEXOL 300 MG/ML  SOLN
100.0000 mL | Freq: Once | INTRAMUSCULAR | Status: AC | PRN
  Administered 2015-05-23: 100 mL via INTRAVENOUS

## 2015-05-23 MED ORDER — SODIUM CHLORIDE 0.9 % IV SOLN
INTRAVENOUS | Status: AC
  Administered 2015-05-23: 23:00:00 via INTRAVENOUS

## 2015-05-23 MED ORDER — FAMOTIDINE IN NACL 20-0.9 MG/50ML-% IV SOLN
20.0000 mg | Freq: Once | INTRAVENOUS | Status: AC
  Administered 2015-05-23: 20 mg via INTRAVENOUS
  Filled 2015-05-23: qty 50

## 2015-05-23 MED ORDER — SODIUM CHLORIDE 0.9 % IV SOLN: INTRAVENOUS | Status: AC

## 2015-05-23 MED ORDER — IOHEXOL 300 MG/ML  SOLN
25.0000 mL | Freq: Once | INTRAMUSCULAR | Status: AC | PRN
Start: 2015-05-23 — End: 2015-05-23
  Administered 2015-05-23: 25 mL via ORAL

## 2015-05-23 MED ORDER — INSULIN ASPART 100 UNIT/ML ~~LOC~~ SOLN: 0.0000 [IU] | SUBCUTANEOUS | Status: DC

## 2015-05-23 MED ORDER — ONDANSETRON HCL 4 MG/2ML IJ SOLN
4.0000 mg | INTRAMUSCULAR | Status: AC | PRN
  Administered 2015-05-23 (×2): 4 mg via INTRAVENOUS
  Filled 2015-05-23 (×2): qty 2

## 2015-05-23 MED ORDER — ONDANSETRON HCL 4 MG/2ML IJ SOLN: 4.0000 mg | Freq: Three times a day (TID) | INTRAMUSCULAR | Status: DC | PRN

## 2015-05-23 MED ORDER — HYDROMORPHONE HCL 1 MG/ML IJ SOLN: 1.0000 mg | INTRAMUSCULAR | Status: DC | PRN

## 2015-05-23 MED ORDER — ENOXAPARIN SODIUM 40 MG/0.4ML ~~LOC~~ SOLN
40.0000 mg | SUBCUTANEOUS | Status: DC
  Administered 2015-05-24 (×2): 40 mg via SUBCUTANEOUS
  Filled 2015-05-23 (×2): qty 0.4

## 2015-05-23 NOTE — ED Notes (Signed)
Pt comes in with family for several episodes of emesis since 0100. Pt is unable to keep any fluids down. VS stable in triage.

## 2015-05-23 NOTE — H&P (Signed)
PCP:   Robert Bellow, MD   Chief Complaint:  n/v  HPI: 81 yo female come in with one day of nausea with vomiting nonbloody with associated abdominal crampiness.  She says its not really "painful".  No diarrhea, no fevers.  Fights constipation normally, last colonoscopy she reports was about 4 years ago and normal.  Denies dysuria, or any recent illnesses.  Has never had any abdominal surgeries.  Referred for admission for psbo.  Pt denies pain at this time, but saying her nausea is getting worse and about to vomit again.  Review of Systems:  Positive and negative as per HPI otherwise all other systems are negative  Past Medical History: Past Medical History  Diagnosis Date  . Degenerative joint disease     Right THA in 1995; cervical discectomy and fusion-2001  . Diabetes mellitus, type 2     + neuropathy  . Hypertension   . Gastroesophageal reflux disease   . Aortic sclerosis     Long-standing heart murmur  . Emphysema   . Anxiety   . Sigmoid diverticulosis   . Hyperlipidemia   . Obesity   . Cholelithiasis     on u/s 09/2011  . Osteoporosis   . Chest pain 2006    +palpitations; minimal CAD at cath in 2005; normal EF; negative stress echo in 09/2008  . IDA (iron deficiency anemia)     labs 09/2011  . Hiatal hernia   . Small bowel lesion     On Given's capsule; Dr Kathleene Hazel 07/2012, no further w/u needed   Past Surgical History  Procedure Laterality Date  . Anterior cervical discectomy  2001    C4-5, allograft, fixation  . Tonsillectomy    . Total hip arthroplasty  1995    Right  . Colonoscopy  2006  . Cataract extraction w/ intraocular lens implant  2007    Left  . Breast lumpectomy      benign  . Colonoscopy  04/03/12    Dr. Ok Edwards diverticulosis, negative microscopic  colitis   . Esophagogastroduodenoscopy  04/03/12    Dr. Jennet Maduro hernia, chronic gastritis on bx  . Givens capsule study  05/09/2012    RMR: an unclear raised area of small  bowel was noted, with features almost  characteristic of very small polyp. This was without villous  blunting or any evidence of active bleeding; yet, the area of  concern appeared to be erythematous. However, this could simply  be a light reflection on a normal variation of the small bowel. REFERRED TO DR. GILLIAM, APPT FOR NOVEMBER 2013.     Medications: Prior to Admission medications   Medication Sig Start Date End Date Taking? Authorizing Provider  aspirin EC 81 MG tablet Take 81 mg by mouth daily.   Yes Historical Provider, MD  glimepiride (AMARYL) 2 MG tablet Take 2 mg by mouth daily.   Yes Historical Provider, MD  LORazepam (ATIVAN) 0.5 MG tablet Take 0.5 mg by mouth at bedtime.   Yes Historical Provider, MD  metoprolol (LOPRESSOR) 50 MG tablet Take 50 mg by mouth 2 (two) times daily.   Yes Historical Provider, MD  omeprazole (PRILOSEC) 20 MG capsule Take 20 mg by mouth Daily. 08/05/12  Yes Historical Provider, MD  ondansetron (ZOFRAN) 8 MG tablet Take 1 tablet (8 mg total) by mouth every 4 (four) hours as needed for nausea or vomiting. 09/01/13  Yes Nat Christen, MD    Allergies:  No Known Allergies  Social History:  reports  that she quit smoking about 44 years ago. Her smoking use included Cigarettes. She has a 30 pack-year smoking history. She quit smokeless tobacco use about 43 years ago. She reports that she does not drink alcohol or use illicit drugs.  Family History: Family History  Problem Relation Age of Onset  . Cancer Brother     lung, age 1  . Diabetes Mother     + brother  . Heart failure Father   . Hypertension Father     + brother  . Colon cancer Neg Hx   . Uterine cancer Sister     Physical Exam: Filed Vitals:   05/23/15 2015 05/23/15 2030 05/23/15 2042 05/23/15 2100  BP:  122/62  132/58  Pulse: 72 68  67  Temp:      TempSrc:      Resp:   18   Weight:      SpO2: 94% 96%  98%   General appearance: alert, cooperative and no distress Head:  Normocephalic, without obvious abnormality, atraumatic Eyes: negative Nose: Nares normal. Septum midline. Mucosa normal. No drainage or sinus tenderness. Neck: no JVD and supple, symmetrical, trachea midline Lungs: clear to auscultation bilaterally Heart: regular rate and rhythm, S1, S2 normal, no murmur, click, rub or gallop Abdomen: soft, non-tender; bowel sounds normal; no masses,  no organomegaly Extremities: extremities normal, atraumatic, no cyanosis or edema Pulses: 2+ and symmetric Skin: Skin color, texture, turgor normal. No rashes or lesions Neurologic: Grossly normal    Labs on Admission:   Recent Labs  05/23/15 1728  NA 142  K 3.7  CL 101  CO2 28  GLUCOSE 161*  BUN 18  CREATININE 1.09*  CALCIUM 9.5    Recent Labs  05/23/15 1728  AST 34  ALT 32  ALKPHOS 83  BILITOT 1.4*  PROT 7.4  ALBUMIN 3.8    Recent Labs  05/23/15 1728  LIPASE 20*    Recent Labs  05/23/15 1728  WBC 11.6*  NEUTROABS 8.2*  HGB 13.8  HCT 41.0  MCV 90.1  PLT 298    Recent Labs  05/23/15 1728  TROPONINI <0.03   Radiological Exams on Admission: Dg Chest 2 View  05/23/2015   CLINICAL DATA:  81 year old female with nausea vomiting and abdominal pain since last night. Initial encounter.  EXAM: CHEST  2 VIEW  COMPARISON:  07/08/2014 and earlier.  FINDINGS: Stable lung volumes. Normal cardiac size and mediastinal contours. Visualized tracheal air column is within normal limits. Lung parenchyma appears stable. No pneumothorax, pulmonary edema, pleural effusion or consolidation. No pneumoperitoneum. Possible mildly dilated small bowel loops on the lateral view, uncertain. No acute osseous abnormality identified. Chronic cervical ACDF.  IMPRESSION: 1.  No acute cardiopulmonary abnormality. 2. Query abnormal small bowel loops in the abdomen. CT Abdomen and Pelvis is pending at this time.   Electronically Signed   By: Genevie Ann M.D.   On: 05/23/2015 18:51   Ct Abdomen Pelvis W  Contrast  05/23/2015   CLINICAL DATA:  81 year old female with vomiting.  EXAM: CT ABDOMEN AND PELVIS WITH CONTRAST  TECHNIQUE: Multidetector CT imaging of the abdomen and pelvis was performed using the standard protocol following bolus administration of intravenous contrast.  CONTRAST:  1mL OMNIPAQUE IOHEXOL 300 MG/ML SOLN, 163mL OMNIPAQUE IOHEXOL 300 MG/ML SOLN  COMPARISON:  Pelvic MRI dated 09/26/2013 and abdominal CT dated 09/03/2013.  FINDINGS: Stable minimal right lung base linear atelectasis/ scarring. The visualized lung bases are otherwise clear. There is no intra-abdominal free air  or free fluid.  The liver appears unremarkable. There is a stone at the neck of the gallbladder. There is no pericholecystic fluid or inflammatory changes of the gallbladder. The pancreas, spleen, and right adrenal gland appear unremarkable. A 1 cm indeterminate left adrenal nodule, possibly an adenoma. There is moderate bilateral renal atrophy, left greater right similar to the prior study. There is a stable 3.1 cm left renal upper pole cyst. Multiple bilateral renal subcentimeter hypodense lesions are too small to characterize. There is stable fullness of the right renal pelvis likely related to an extrarenal pelvis. There is no hydronephrosis on either side. The visualized ureters and urinary bladder appear unremarkable.  Evaluation of the pelvis is limited due to streak artifact caused by metallic right hip arthroplasty. There is limited visualization and evaluation of the uterus. A grossly stable 2.7 x 3.2 cm fluid attenuating structure seen in the right posterior hemipelvis (series 2, image 60) corresponds to the cystic lesion seen on the prior CT and MR.  There is mild dilatation of loops of proximal and mid small bowel measuring up to 3 cm in diameter. The distal small bowel and terminal ileum demonstrate a normal caliber. A transition zone is noted in the right anterior hemipelvis. There is extensive diverticulosis of  the sigmoid colon without active inflammation. There are scattered diverticula along the transverse colon as well. The appendix is unremarkable.  Moderate aortoiliac atherosclerotic disease. The origins of the celiac axis, SMA, IMA as well as the origins of the renal arteries are patent. No portal venous gas identified. There is no lymphadenopathy. There is degenerative changes of the spine. A metallic right hip arthroplasty is noted. No acute fracture.  IMPRESSION: Early versus low grade small-bowel obstruction with transition zone in the right anterior hemipelvis. Clinical correlation and follow-up recommended.  Extensive colonic diverticulosis without active inflammation.  Stable right adnexal cystic lesion.   Electronically Signed   By: Anner Crete M.D.   On: 05/23/2015 19:38   cxr reviewed no edema or infiltrate Case doscissed with ed doctor mcmannus ekg reviewed nsr no acute issues Old chart reviewed  Assessment/Plan  81 yo female with n/v and partial sbo  Principal Problem:   pSBO (small bowel obstruction)- abd exam is benign.  Conservative management , has mildly elevated lactic acid level has only gotten 500cc ivf in the ED.  Instructed to give another 500cc bolus.  Repeat lactic acid level q 3 hours, place on ivf ns 100cc/hour and bolus again depending on lactic acid levels.  Otherwise vitals all normal.  Keep npo, prn zofran.  If zofran does not help and continues to vomit, place ngt.  Pt high risk for complications due to age, but hopefully she will improve to conservative management.   May need repeat cscope at some point.  Active Problems:   Diabetes mellitus, type 2- hold home meds, place on ssi   Hypertension- stable, hold home meds, order prn if needed later but none needed now   Aortic sclerosis- noted   Anemia, iron deficiency- noted   Vomiting- as above  Admit to medical bed.  FULL CODE.  pcp dr Fanny Bien A 05/23/2015, 10:09 PM

## 2015-05-23 NOTE — ED Provider Notes (Signed)
CSN: [id number]     Arrival date & time 05/23/15  1639 History   First MD Initiated Contact with Patient 05/23/15 1647     Chief Complaint  Patient presents with  . Weakness  . Emesis      HPI Pt was seen at 1645. Per pt and her family, c/o gradual onset and persistence of multiple intermittent episodes of N/V that began overnight last night. Has been associated with generalized "aching" abd pain. States she had a "normal BM" this morning. Pt has been unable to tol PO due to her N/V.  Denies diarrhea, no CP/SOB, no back pain, no fevers, no black or blood in stools or emesis.    Past Medical History  Diagnosis Date  . Degenerative joint disease     Right THA in 1995; cervical discectomy and fusion-2001  . Diabetes mellitus, type 2     + neuropathy  . Hypertension   . Gastroesophageal reflux disease   . Aortic sclerosis     Long-standing heart murmur  . Emphysema   . Anxiety   . Sigmoid diverticulosis   . Hyperlipidemia   . Obesity   . Cholelithiasis     on u/s 09/2011  . Osteoporosis   . Chest pain 2006    +palpitations; minimal CAD at cath in 2005; normal EF; negative stress echo in 09/2008  . IDA (iron deficiency anemia)     labs 09/2011  . Hiatal hernia   . Small bowel lesion     On Given's capsule; Dr Kathleene Hazel 07/2012, no further w/u needed   Past Surgical History  Procedure Laterality Date  . Anterior cervical discectomy  2001    C4-5, allograft, fixation  . Tonsillectomy    . Total hip arthroplasty  1995    Right  . Colonoscopy  2006  . Cataract extraction w/ intraocular lens implant  2007    Left  . Breast lumpectomy      benign  . Colonoscopy  04/03/12    Dr. Ok Edwards diverticulosis, negative microscopic  colitis   . Esophagogastroduodenoscopy  04/03/12    Dr. Jennet Maduro hernia, chronic gastritis on bx  . Givens capsule study  05/09/2012    RMR: an unclear raised area of small bowel was noted, with features almost  characteristic of very small  polyp. This was without villous  blunting or any evidence of active bleeding; yet, the area of  concern appeared to be erythematous. However, this could simply  be a light reflection on a normal variation of the small bowel. REFERRED TO DR. GILLIAM, APPT FOR NOVEMBER 2013.    Family History  Problem Relation Age of Onset  . Cancer Brother     lung, age 108  . Diabetes Mother     + brother  . Heart failure Father   . Hypertension Father     + brother  . Colon cancer Neg Hx   . Uterine cancer Sister    Social History  Substance Use Topics  . Smoking status: Former Smoker -- 1.00 packs/day for 30 years    Types: Cigarettes    Quit date: 10/15/1970  . Smokeless tobacco: Former Systems developer    Quit date: 09/19/1971     Comment: Quit x 50 years  . Alcohol Use: No    Review of Systems ROS: Statement: All systems negative except as marked or noted in the HPI; Constitutional: Negative for fever and chills. ; ; Eyes: Negative for eye pain, redness and discharge. ; ;  ENMT: Negative for ear pain, hoarseness, nasal congestion, sinus pressure and sore throat. ; ; Cardiovascular: Negative for chest pain, palpitations, diaphoresis, dyspnea and peripheral edema. ; ; Respiratory: Negative for cough, wheezing and stridor. ; ; Gastrointestinal: +N/V, abd pain. Negative for diarrhea, blood in stool, hematemesis, jaundice and rectal bleeding. . ; ; Genitourinary: Negative for dysuria, flank pain and hematuria. ; ; Musculoskeletal: Negative for back pain and neck pain. Negative for swelling and trauma.; ; Skin: Negative for pruritus, rash, abrasions, blisters, bruising and skin lesion.; ; Neuro: Negative for headache, lightheadedness and neck stiffness. Negative for weakness, altered level of consciousness , altered mental status, extremity weakness, paresthesias, involuntary movement, seizure and syncope.      Allergies  Review of patient's allergies indicates no known allergies.  Home Medications   Prior to  Admission medications   Medication Sig Start Date End Date Taking? Authorizing Provider  alendronate (FOSAMAX) 70 MG tablet Take 70 mg by mouth every 7 (seven) days. Take with a full glass of water on an empty stomach. On Wednesdays    Historical Provider, MD  aspirin EC 81 MG tablet Take 81 mg by mouth daily.    Historical Provider, MD  loperamide (IMODIUM A-D) 2 MG tablet Take 2 mg by mouth as needed.    Historical Provider, MD  losartan (COZAAR) 25 MG tablet Take 1 tablet (25 mg total) by mouth daily. 09/05/13   Lemmie Evens, MD  meclizine (ANTIVERT) 12.5 MG tablet Take 12.5 mg by mouth 3 (three) times daily as needed for dizziness.    Historical Provider, MD  metFORMIN (GLUCOPHAGE) 500 MG tablet Take 1,000 mg by mouth 2 (two) times daily with a meal.     Historical Provider, MD  omeprazole (PRILOSEC) 20 MG capsule Take 20 mg by mouth Daily. 08/05/12   Historical Provider, MD  ondansetron (ZOFRAN) 8 MG tablet Take 1 tablet (8 mg total) by mouth every 4 (four) hours as needed for nausea or vomiting. 09/01/13   Nat Christen, MD   BP 132/48 mmHg  Pulse 66  Temp(Src) 97.7 F (36.5 C) (Oral)  Resp 21  Wt 190 lb (86.183 kg)  SpO2 99%   17:09:49 Orthostatic Vital Signs RM  Orthostatic Lying  - BP- Lying: 132/62 mmHg ; Pulse- Lying: 70  Orthostatic Sitting - BP- Sitting: 136/72 mmHg ; Pulse- Sitting: 68  Orthostatic Standing at 0 minutes - BP- Standing at 0 minutes: 111/61 mmHg ; Pulse- Standing at 0 minutes: 81      Physical Exam  1650: Physical examination:  Nursing notes reviewed; Vital signs and O2 SAT reviewed;  Constitutional: Well developed, Well nourished, Well hydrated, In no acute distress; Head:  Normocephalic, atraumatic; Eyes: EOMI, PERRL, No scleral icterus; ENMT: Mouth and pharynx normal, Mucous membranes moist; Neck: Supple, Full range of motion, No lymphadenopathy; Cardiovascular: Regular rate and rhythm, No gallop; Respiratory: Breath sounds clear & equal bilaterally, No  wheezes.  Speaking full sentences with ease, Normal respiratory effort/excursion; Chest: Nontender, Movement normal; Abdomen: Soft, +generalized tenderness to palp. No rebound or guarding. +softly distended and tympanitic. Increased bowel sounds; Genitourinary: No CVA tenderness; Extremities: Pulses normal, No tenderness, No edema, No calf edema or asymmetry.; Neuro: AA&Ox3, +HOH, otherwise major CN grossly intact.  Speech clear. No gross focal motor or sensory deficits in extremities.; Skin: Color normal, Warm, Dry.   ED Course  Procedures (including critical care time)  Labs Review   Imaging Review  I have personally reviewed and evaluated these images and lab  results as part of my medical decision-making.   EKG Interpretation None      MDM  MDM Reviewed: previous chart, nursing note and vitals Reviewed previous: labs and ECG Interpretation: labs, ECG and x-ray   Results for orders placed or performed during the hospital encounter of 05/23/15  Urinalysis, Routine w reflex microscopic  Result Value Ref Range   Color, Urine YELLOW YELLOW   APPearance CLEAR CLEAR   Specific Gravity, Urine 1.010 1.005 - 1.030   pH 8.5 (H) 5.0 - 8.0   Glucose, UA NEGATIVE NEGATIVE mg/dL   Hgb urine dipstick NEGATIVE NEGATIVE   Bilirubin Urine NEGATIVE NEGATIVE   Ketones, ur 15 (A) NEGATIVE mg/dL   Protein, ur TRACE (A) NEGATIVE mg/dL   Urobilinogen, UA 1.0 0.0 - 1.0 mg/dL   Nitrite NEGATIVE NEGATIVE   Leukocytes, UA NEGATIVE NEGATIVE  Comprehensive metabolic panel  Result Value Ref Range   Sodium 142 135 - 145 mmol/L   Potassium 3.7 3.5 - 5.1 mmol/L   Chloride 101 101 - 111 mmol/L   CO2 28 22 - 32 mmol/L   Glucose, Bld 161 (H) 65 - 99 mg/dL   BUN 18 6 - 20 mg/dL   Creatinine, Ser 1.09 (H) 0.44 - 1.00 mg/dL   Calcium 9.5 8.9 - 10.3 mg/dL   Total Protein 7.4 6.5 - 8.1 g/dL   Albumin 3.8 3.5 - 5.0 g/dL   AST 34 15 - 41 U/L   ALT 32 14 - 54 U/L   Alkaline Phosphatase 83 38 - 126 U/L    Total Bilirubin 1.4 (H) 0.3 - 1.2 mg/dL   GFR calc non Af Amer 43 (L) >60 mL/min   GFR calc Af Amer 50 (L) >60 mL/min   Anion gap 13 5 - 15  Lipase, blood  Result Value Ref Range   Lipase 20 (L) 22 - 51 U/L  Troponin I  Result Value Ref Range   Troponin I <0.03 <0.031 ng/mL  Lactic acid, plasma  Result Value Ref Range   Lactic Acid, Venous 2.3 (HH) 0.5 - 2.0 mmol/L  CBC with Differential  Result Value Ref Range   WBC 11.6 (H) 4.0 - 10.5 K/uL   RBC 4.55 3.87 - 5.11 MIL/uL   Hemoglobin 13.8 12.0 - 15.0 g/dL   HCT 41.0 36.0 - 46.0 %   MCV 90.1 78.0 - 100.0 fL   MCH 30.3 26.0 - 34.0 pg   MCHC 33.7 30.0 - 36.0 g/dL   RDW 13.9 11.5 - 15.5 %   Platelets 298 150 - 400 K/uL   Neutrophils Relative % 70 43 - 77 %   Neutro Abs 8.2 (H) 1.7 - 7.7 K/uL   Lymphocytes Relative 21 12 - 46 %   Lymphs Abs 2.4 0.7 - 4.0 K/uL   Monocytes Relative 8 3 - 12 %   Monocytes Absolute 0.9 0.1 - 1.0 K/uL   Eosinophils Relative 1 0 - 5 %   Eosinophils Absolute 0.1 0.0 - 0.7 K/uL   Basophils Relative 0 0 - 1 %   Basophils Absolute 0.0 0.0 - 0.1 K/uL  Urine microscopic-add on  Result Value Ref Range   RBC / HPF 0-2 <3 RBC/hpf   Bacteria, UA RARE RARE   Dg Chest 2 View 05/23/2015   CLINICAL DATA:  81 year old female with nausea vomiting and abdominal pain since last night. Initial encounter.  EXAM: CHEST  2 VIEW  COMPARISON:  07/08/2014 and earlier.  FINDINGS: Stable lung volumes. Normal cardiac size and  mediastinal contours. Visualized tracheal air column is within normal limits. Lung parenchyma appears stable. No pneumothorax, pulmonary edema, pleural effusion or consolidation. No pneumoperitoneum. Possible mildly dilated small bowel loops on the lateral view, uncertain. No acute osseous abnormality identified. Chronic cervical ACDF.  IMPRESSION: 1.  No acute cardiopulmonary abnormality. 2. Query abnormal small bowel loops in the abdomen. CT Abdomen and Pelvis is pending at this time.   Electronically Signed    By: Genevie Ann M.D.   On: 05/23/2015 18:51   Ct Abdomen Pelvis W Contrast 05/23/2015   CLINICAL DATA:  81 year old female with vomiting.  EXAM: CT ABDOMEN AND PELVIS WITH CONTRAST  TECHNIQUE: Multidetector CT imaging of the abdomen and pelvis was performed using the standard protocol following bolus administration of intravenous contrast.  CONTRAST:  21mL OMNIPAQUE IOHEXOL 300 MG/ML SOLN, 140mL OMNIPAQUE IOHEXOL 300 MG/ML SOLN  COMPARISON:  Pelvic MRI dated 09/26/2013 and abdominal CT dated 09/03/2013.  FINDINGS: Stable minimal right lung base linear atelectasis/ scarring. The visualized lung bases are otherwise clear. There is no intra-abdominal free air or free fluid.  The liver appears unremarkable. There is a stone at the neck of the gallbladder. There is no pericholecystic fluid or inflammatory changes of the gallbladder. The pancreas, spleen, and right adrenal gland appear unremarkable. A 1 cm indeterminate left adrenal nodule, possibly an adenoma. There is moderate bilateral renal atrophy, left greater right similar to the prior study. There is a stable 3.1 cm left renal upper pole cyst. Multiple bilateral renal subcentimeter hypodense lesions are too small to characterize. There is stable fullness of the right renal pelvis likely related to an extrarenal pelvis. There is no hydronephrosis on either side. The visualized ureters and urinary bladder appear unremarkable.  Evaluation of the pelvis is limited due to streak artifact caused by metallic right hip arthroplasty. There is limited visualization and evaluation of the uterus. A grossly stable 2.7 x 3.2 cm fluid attenuating structure seen in the right posterior hemipelvis (series 2, image 60) corresponds to the cystic lesion seen on the prior CT and MR.  There is mild dilatation of loops of proximal and mid small bowel measuring up to 3 cm in diameter. The distal small bowel and terminal ileum demonstrate a normal caliber. A transition zone is noted in the  right anterior hemipelvis. There is extensive diverticulosis of the sigmoid colon without active inflammation. There are scattered diverticula along the transverse colon as well. The appendix is unremarkable.  Moderate aortoiliac atherosclerotic disease. The origins of the celiac axis, SMA, IMA as well as the origins of the renal arteries are patent. No portal venous gas identified. There is no lymphadenopathy. There is degenerative changes of the spine. A metallic right hip arthroplasty is noted. No acute fracture.  IMPRESSION: Early versus low grade small-bowel obstruction with transition zone in the right anterior hemipelvis. Clinical correlation and follow-up recommended.  Extensive colonic diverticulosis without active inflammation.  Stable right adnexal cystic lesion.   Electronically Signed   By: Anner Crete M.D.   On: 05/23/2015 19:38    2030:  Pt orthostatic on VS, c/o "dizziness" and "weakness" when standing; judicious IVF given. Not has not had any emesis or stooling while in the ED. Dx and testing d/w pt and family.  Questions answered.  Verb understanding, agreeable to admit. T/C to [facility] Dr. Shanon Brow, case discussed, including:  HPI, pertinent PM/SHx, VS/PE, dx testing, ED course and treatment:  Agreeable to admit, requests to dose additional IVF, write temporary orders, obtain  medical bed to Dr. Vickey Sages service.      Francine Graven, DO 05/26/15 1857

## 2015-05-23 NOTE — ED Notes (Signed)
Pt reports emesis x1 around 1745

## 2015-05-24 ENCOUNTER — Encounter (HOSPITAL_COMMUNITY): Payer: Self-pay | Admitting: *Deleted

## 2015-05-24 DIAGNOSIS — E119 Type 2 diabetes mellitus without complications: Secondary | ICD-10-CM | POA: Diagnosis not present

## 2015-05-24 DIAGNOSIS — M1990 Unspecified osteoarthritis, unspecified site: Secondary | ICD-10-CM | POA: Diagnosis present

## 2015-05-24 DIAGNOSIS — Z87891 Personal history of nicotine dependence: Secondary | ICD-10-CM | POA: Diagnosis not present

## 2015-05-24 DIAGNOSIS — Z8249 Family history of ischemic heart disease and other diseases of the circulatory system: Secondary | ICD-10-CM | POA: Diagnosis not present

## 2015-05-24 DIAGNOSIS — E1140 Type 2 diabetes mellitus with diabetic neuropathy, unspecified: Secondary | ICD-10-CM | POA: Diagnosis present

## 2015-05-24 DIAGNOSIS — D509 Iron deficiency anemia, unspecified: Secondary | ICD-10-CM | POA: Diagnosis present

## 2015-05-24 DIAGNOSIS — K5669 Other intestinal obstruction: Secondary | ICD-10-CM

## 2015-05-24 DIAGNOSIS — I700 Atherosclerosis of aorta: Secondary | ICD-10-CM

## 2015-05-24 DIAGNOSIS — K219 Gastro-esophageal reflux disease without esophagitis: Secondary | ICD-10-CM | POA: Diagnosis present

## 2015-05-24 DIAGNOSIS — R112 Nausea with vomiting, unspecified: Secondary | ICD-10-CM | POA: Diagnosis present

## 2015-05-24 DIAGNOSIS — M810 Age-related osteoporosis without current pathological fracture: Secondary | ICD-10-CM | POA: Diagnosis present

## 2015-05-24 DIAGNOSIS — Z833 Family history of diabetes mellitus: Secondary | ICD-10-CM | POA: Diagnosis not present

## 2015-05-24 DIAGNOSIS — E785 Hyperlipidemia, unspecified: Secondary | ICD-10-CM | POA: Diagnosis present

## 2015-05-24 DIAGNOSIS — Z809 Family history of malignant neoplasm, unspecified: Secondary | ICD-10-CM | POA: Diagnosis not present

## 2015-05-24 DIAGNOSIS — K5660 Unspecified intestinal obstruction: Secondary | ICD-10-CM | POA: Diagnosis present

## 2015-05-24 DIAGNOSIS — Z96641 Presence of right artificial hip joint: Secondary | ICD-10-CM | POA: Diagnosis present

## 2015-05-24 DIAGNOSIS — I10 Essential (primary) hypertension: Secondary | ICD-10-CM | POA: Diagnosis present

## 2015-05-24 LAB — BASIC METABOLIC PANEL
Anion gap: 5 (ref 5–15)
BUN: 16 mg/dL (ref 6–20)
CO2: 27 mmol/L (ref 22–32)
Calcium: 8.2 mg/dL — ABNORMAL LOW (ref 8.9–10.3)
Chloride: 109 mmol/L (ref 101–111)
Creatinine, Ser: 0.91 mg/dL (ref 0.44–1.00)
GFR calc Af Amer: >60 mL/min (ref >60)
GFR calc non Af Amer: 54 mL/min — ABNORMAL LOW (ref >60)
Glucose, Bld: 100 mg/dL — ABNORMAL HIGH (ref 65–99)
Potassium: 3.5 mmol/L (ref 3.5–5.1)
Sodium: 141 mmol/L (ref 135–145)

## 2015-05-24 LAB — CBC
HCT: 35.2 % — ABNORMAL LOW (ref 36.0–46.0)
Hemoglobin: 11.6 g/dL — ABNORMAL LOW (ref 12.0–15.0)
MCH: 30.4 pg (ref 26.0–34.0)
MCHC: 33 g/dL (ref 30.0–36.0)
MCV: 92.1 fL (ref 78.0–100.0)
Platelets: 233 10*3/uL (ref 150–400)
RBC: 3.82 MIL/uL — ABNORMAL LOW (ref 3.87–5.11)
RDW: 14 % (ref 11.5–15.5)
WBC: 7 10*3/uL (ref 4.0–10.5)

## 2015-05-24 LAB — GLUCOSE, CAPILLARY
Glucose-Capillary: 108 mg/dL — ABNORMAL HIGH (ref 65–99)
Glucose-Capillary: 112 mg/dL — ABNORMAL HIGH (ref 65–99)
Glucose-Capillary: 114 mg/dL — ABNORMAL HIGH (ref 65–99)
Glucose-Capillary: 81 mg/dL (ref 65–99)
Glucose-Capillary: 83 mg/dL (ref 65–99)
Glucose-Capillary: 84 mg/dL (ref 65–99)
Glucose-Capillary: 85 mg/dL (ref 65–99)
Glucose-Capillary: 96 mg/dL (ref 65–99)

## 2015-05-24 LAB — TSH: TSH: 1.412 u[IU]/mL (ref 0.350–4.500)

## 2015-05-24 LAB — LACTIC ACID, PLASMA: Lactic Acid, Venous: 0.8 mmol/L (ref 0.5–2.0)

## 2015-05-24 MED ORDER — ZOLPIDEM TARTRATE 5 MG PO TABS
5.0000 mg | ORAL_TABLET | Freq: Once | ORAL | Status: AC
Start: 2015-05-24 — End: 2015-05-24
  Administered 2015-05-24: 5 mg via ORAL
  Filled 2015-05-24: qty 1

## 2015-05-24 MED ORDER — PNEUMOCOCCAL VAC POLYVALENT 25 MCG/0.5ML IJ INJ
0.5000 mL | INJECTION | INTRAMUSCULAR | Status: AC
Start: 2015-05-25 — End: 2015-05-25
  Administered 2015-05-25: 0.5 mL via INTRAMUSCULAR
  Filled 2015-05-24: qty 0.5

## 2015-05-24 MED ORDER — INFLUENZA VAC SPLIT QUAD 0.5 ML IM SUSY
0.5000 mL | PREFILLED_SYRINGE | INTRAMUSCULAR | Status: AC
  Administered 2015-05-25: 0.5 mL via INTRAMUSCULAR
  Filled 2015-05-24: qty 0.5

## 2015-05-24 NOTE — Progress Notes (Signed)
Pt states she is upset and cannot sleep. She states the bed is uncomfortable and asked nurse to page MD for something to help her rest. MD responded and stated that there is nothing she can be given. Explained this to patient.

## 2015-05-24 NOTE — Progress Notes (Signed)
[facility] PROGRESS NOTE  Denise Parrish PPI:[id number] DOB: [birth year]    PCP:   Robert Bellow, MD   HPI: Denise Parrish is an 81 y.o. female with hx of DM, aortic slerosis, anxiety, HTN, HLD, iron deficiency anemia, with recurrent nausea and vomiting chronically, happening about once a month according to her niece, admitted for nausea, vomiting without abdominal pain.  She has An abd pelvic CT which showed low grade small bowel obstruction.  She never had abdominal surgery.  This morning, she felt better.  She has no leukocytosis or fever.  Her IStat lactic acid was elevated, but her follow up serum lactic acid was normal.  She wanted to eat.   Rewiew of Systems:  Constitutional: Negative for malaise, fever and chills. No significant weight loss or weight gain Eyes: Negative for eye pain, redness and discharge, diplopia, visual changes, or flashes of light. ENMT: Negative for ear pain, hoarseness, nasal congestion, sinus pressure and sore throat. No headaches; tinnitus, drooling, or problem swallowing. Cardiovascular: Negative for chest pain, palpitations, diaphoresis, dyspnea and peripheral edema. ; No orthopnea, PND Respiratory: Negative for cough, hemoptysis, wheezing and stridor. No pleuritic chestpain. Gastrointestinal: Negative for nausea, vomiting, diarrhea, constipation, abdominal pain, melena, blood in stool, hematemesis, jaundice and rectal bleeding.    Genitourinary: Negative for frequency, dysuria, incontinence,flank pain and hematuria; Musculoskeletal: Negative for back pain and neck pain. Negative for swelling and trauma.;  Skin: . Negative for pruritus, rash, abrasions, bruising and skin lesion.; ulcerations Neuro: Negative for headache, lightheadedness and neck stiffness. Negative for weakness, altered level of consciousness , altered mental status, extremity weakness, burning feet, involuntary movement, seizure and syncope.  Psych: negative for anxiety,  depression, insomnia, tearfulness, panic attacks, hallucinations, paranoia, suicidal or homicidal ideation    Past Medical History  Diagnosis Date  . Degenerative joint disease     Right THA in 1995; cervical discectomy and fusion-2001  . Diabetes mellitus, type 2     + neuropathy  . Hypertension   . Gastroesophageal reflux disease   . Aortic sclerosis     Long-standing heart murmur  . Emphysema   . Anxiety   . Sigmoid diverticulosis   . Hyperlipidemia   . Obesity   . Cholelithiasis     on u/s 09/2011  . Osteoporosis   . Chest pain 2006    +palpitations; minimal CAD at cath in 2005; normal EF; negative stress echo in 09/2008  . IDA (iron deficiency anemia)     labs 09/2011  . Hiatal hernia   . Small bowel lesion     On Given's capsule; Dr Kathleene Hazel 07/2012, no further w/u needed    Past Surgical History  Procedure Laterality Date  . Anterior cervical discectomy  2001    C4-5, allograft, fixation  . Tonsillectomy    . Total hip arthroplasty  1995    Right  . Colonoscopy  2006  . Cataract extraction w/ intraocular lens implant  2007    Left  . Breast lumpectomy      benign  . Colonoscopy  04/03/12    Dr. Ok Edwards diverticulosis, negative microscopic  colitis   . Esophagogastroduodenoscopy  04/03/12    Dr. Jennet Maduro hernia, chronic gastritis on bx  . Givens capsule study  05/09/2012    RMR: an unclear raised area of small bowel was noted, with features almost  characteristic of very small polyp. This was without villous  blunting or any evidence of active bleeding; yet, the area of  concern appeared to  be erythematous. However, this could simply  be a light reflection on a normal variation of the small bowel. REFERRED TO DR. GILLIAM, APPT FOR NOVEMBER 2013.     Medications:  HOME MEDS: Prior to Admission medications   Medication Sig Start Date End Date Taking? Authorizing Provider  aspirin EC 81 MG tablet Take 81 mg by mouth daily.   Yes Historical  Provider, MD  glimepiride (AMARYL) 2 MG tablet Take 2 mg by mouth daily.   Yes Historical Provider, MD  LORazepam (ATIVAN) 0.5 MG tablet Take 0.5 mg by mouth at bedtime.   Yes Historical Provider, MD  metoprolol (LOPRESSOR) 50 MG tablet Take 50 mg by mouth 2 (two) times daily.   Yes Historical Provider, MD  omeprazole (PRILOSEC) 20 MG capsule Take 20 mg by mouth Daily. 08/05/12  Yes Historical Provider, MD  ondansetron (ZOFRAN) 8 MG tablet Take 1 tablet (8 mg total) by mouth every 4 (four) hours as needed for nausea or vomiting. 09/01/13  Yes Nat Christen, MD     Allergies:  No Known Allergies  Social History:   reports that she quit smoking about 44 years ago. Her smoking use included Cigarettes. She has a 30 pack-year smoking history. She quit smokeless tobacco use about 43 years ago. She reports that she does not drink alcohol or use illicit drugs.  Family History: Family History  Problem Relation Age of Onset  . Cancer Brother     lung, age 34  . Diabetes Mother     + brother  . Heart failure Father   . Hypertension Father     + brother  . Colon cancer Neg Hx   . Uterine cancer Sister      Physical Exam: Filed Vitals:   05/23/15 2224 05/23/15 2259 05/24/15 0552 05/24/15 0554  BP: 136/58 130/53 118/49   Pulse: 68 72 63   Temp: 98.7 F (37.1 C)  98.2 F (36.8 C)   TempSrc: Oral  Oral   Resp: 20  20   Height: 5\' 5"  (1.651 m)     Weight: 74.072 kg (163 lb 4.8 oz)   74 kg (163 lb 2.3 oz)  SpO2: 96%      Blood pressure 118/49, pulse 63, temperature 98.2 F (36.8 C), temperature source Oral, resp. rate 20, height 5\' 5"  (1.651 m), weight 74 kg (163 lb 2.3 oz), SpO2 96 %.  GEN:  Pleasant patient lying in the stretcher in no acute distress; cooperative with exam. PSYCH:  alert and oriented x4; does not appear anxious or depressed; affect is appropriate. HEENT: Mucous membranes pink and anicteric; PERRLA; EOM intact; no cervical lymphadenopathy nor thyromegaly or carotid  bruit; no JVD; There were no stridor. Neck is very supple. Breasts:: Not examined CHEST WALL: No tenderness CHEST: Normal respiration, clear to auscultation bilaterally.  HEART: Regular rate and rhythm.  There are no murmur, rub, or gallops.   BACK: No kyphosis or scoliosis; no CVA tenderness ABDOMEN: soft and non-tender; no masses, no organomegaly, normal abdominal bowel sounds; no pannus; no intertriginous candida. There is no rebound and no distention. Rectal Exam: Not done EXTREMITIES: No bone or joint deformity; age-appropriate arthropathy of the hands and knees; no edema; no ulcerations.  There is no calf tenderness. Genitalia: not examined PULSES: 2+ and symmetric SKIN: Normal hydration no rash or ulceration CNS: Cranial nerves 2-12 grossly intact no focal lateralizing neurologic deficit.  Speech is fluent; uvula elevated with phonation, facial symmetry and tongue midline. DTR are normal bilaterally,  cerebella exam is intact, barbinski is negative and strengths are equaled bilaterally.  No sensory loss.   Labs on Admission:  Basic Metabolic Panel:  Recent Labs Lab 05/23/15 1728 05/23/15 2312 05/24/15 0535  NA 142  --  141  K 3.7  --  3.5  CL 101  --  109  CO2 28  --  27  GLUCOSE 161*  --  100*  BUN 18  --  16  CREATININE 1.09* 0.98 0.91  CALCIUM 9.5  --  8.2*  MG 1.8  --   --    Liver Function Tests:  Recent Labs Lab 05/23/15 1728  AST 34  ALT 32  ALKPHOS 83  BILITOT 1.4*  PROT 7.4  ALBUMIN 3.8    Recent Labs Lab 05/23/15 1728  LIPASE 20*   No results for input(s): AMMONIA in the last 168 hours. CBC:  Recent Labs Lab 05/23/15 1728 05/23/15 2312 05/24/15 0535  WBC 11.6* 7.4 7.0  NEUTROABS 8.2*  --   --   HGB 13.8 12.6 11.6*  HCT 41.0 37.8 35.2*  MCV 90.1 90.6 92.1  PLT 298 253 233   Cardiac Enzymes:  Recent Labs Lab 05/23/15 1728  TROPONINI <0.03    CBG:  Recent Labs Lab 05/23/15 2239 05/24/15 0148 05/24/15 0449 05/24/15 0722  05/24/15 1208  GLUCAP 147* 108* 112* 96 84     Radiological Exams on Admission: Dg Chest 2 View  05/23/2015   CLINICAL DATA:  81 year old female with nausea vomiting and abdominal pain since last night. Initial encounter.  EXAM: CHEST  2 VIEW  COMPARISON:  07/08/2014 and earlier.  FINDINGS: Stable lung volumes. Normal cardiac size and mediastinal contours. Visualized tracheal air column is within normal limits. Lung parenchyma appears stable. No pneumothorax, pulmonary edema, pleural effusion or consolidation. No pneumoperitoneum. Possible mildly dilated small bowel loops on the lateral view, uncertain. No acute osseous abnormality identified. Chronic cervical ACDF.  IMPRESSION: 1.  No acute cardiopulmonary abnormality. 2. Query abnormal small bowel loops in the abdomen. CT Abdomen and Pelvis is pending at this time.   Electronically Signed   By: Genevie Ann M.D.   On: 05/23/2015 18:51   Ct Abdomen Pelvis W Contrast  05/23/2015   CLINICAL DATA:  81 year old female with vomiting.  EXAM: CT ABDOMEN AND PELVIS WITH CONTRAST  TECHNIQUE: Multidetector CT imaging of the abdomen and pelvis was performed using the standard protocol following bolus administration of intravenous contrast.  CONTRAST:  25mL OMNIPAQUE IOHEXOL 300 MG/ML SOLN, 133mL OMNIPAQUE IOHEXOL 300 MG/ML SOLN  COMPARISON:  Pelvic MRI dated 09/26/2013 and abdominal CT dated 09/03/2013.  FINDINGS: Stable minimal right lung base linear atelectasis/ scarring. The visualized lung bases are otherwise clear. There is no intra-abdominal free air or free fluid.  The liver appears unremarkable. There is a stone at the neck of the gallbladder. There is no pericholecystic fluid or inflammatory changes of the gallbladder. The pancreas, spleen, and right adrenal gland appear unremarkable. A 1 cm indeterminate left adrenal nodule, possibly an adenoma. There is moderate bilateral renal atrophy, left greater right similar to the prior study. There is a stable 3.1 cm  left renal upper pole cyst. Multiple bilateral renal subcentimeter hypodense lesions are too small to characterize. There is stable fullness of the right renal pelvis likely related to an extrarenal pelvis. There is no hydronephrosis on either side. The visualized ureters and urinary bladder appear unremarkable.  Evaluation of the pelvis is limited due to streak artifact caused by metallic right  hip arthroplasty. There is limited visualization and evaluation of the uterus. A grossly stable 2.7 x 3.2 cm fluid attenuating structure seen in the right posterior hemipelvis (series 2, image 60) corresponds to the cystic lesion seen on the prior CT and MR.  There is mild dilatation of loops of proximal and mid small bowel measuring up to 3 cm in diameter. The distal small bowel and terminal ileum demonstrate a normal caliber. A transition zone is noted in the right anterior hemipelvis. There is extensive diverticulosis of the sigmoid colon without active inflammation. There are scattered diverticula along the transverse colon as well. The appendix is unremarkable.  Moderate aortoiliac atherosclerotic disease. The origins of the celiac axis, SMA, IMA as well as the origins of the renal arteries are patent. No portal venous gas identified. There is no lymphadenopathy. There is degenerative changes of the spine. A metallic right hip arthroplasty is noted. No acute fracture.  IMPRESSION: Early versus low grade small-bowel obstruction with transition zone in the right anterior hemipelvis. Clinical correlation and follow-up recommended.  Extensive colonic diverticulosis without active inflammation.  Stable right adnexal cystic lesion.   Electronically Signed   By: Anner Crete M.D.   On: 05/23/2015 19:38   Assessment/Plan Present on Admission:  . SBO (small bowel obstruction) . Hypertension . Aortic sclerosis . Anemia, iron deficiency . Vomiting  PLAN:  PSBO, resolving clinically.  I wonder if she also has DM  gastroparesis.  Will keep her inpatient, continue IVF, and start clear liquid.  Her HTN is stable, and will continue with her meds.  She has no further vomiting.    Other plans as per orders.  Code Status:FULL CODE.    Orvan Falconer, MD. [facility] Pager [phone] 7pm to 7am.  05/24/2015, 1:48 PM

## 2015-05-24 NOTE — Progress Notes (Signed)
Patient requested something to help her sleep.  MD notified.  Orders received.

## 2015-05-25 DIAGNOSIS — K5660 Unspecified intestinal obstruction: Secondary | ICD-10-CM | POA: Diagnosis not present

## 2015-05-25 DIAGNOSIS — K5730 Diverticulosis of large intestine without perforation or abscess without bleeding: Secondary | ICD-10-CM | POA: Diagnosis present

## 2015-05-25 LAB — GLUCOSE, CAPILLARY
Glucose-Capillary: 86 mg/dL (ref 65–99)
Glucose-Capillary: 121 mg/dL — ABNORMAL HIGH (ref 65–99)
Glucose-Capillary: 113 mg/dL — ABNORMAL HIGH (ref 65–99)

## 2015-05-25 MED ORDER — INSULIN ASPART 100 UNIT/ML ~~LOC~~ SOLN: 0.0000 [IU] | Freq: Three times a day (TID) | SUBCUTANEOUS | Status: DC

## 2015-05-25 NOTE — Progress Notes (Signed)
Pt. discharged home today. Pt. Received discharge instructions, prescriptions and care notes. Pt. Verbalized understanding and has no questions or concerns at this time. Pt.'s IV removed with catheter intact,no bleeding or complications. Pt. Left unit in stable condition in wheelchair with staff member.

## 2015-05-25 NOTE — Care Management Note (Signed)
Case Management Note  Patient Details  Name: Denise Parrish MRN: [id number] Date of Birth: [birth year]  Expected Discharge Date:                  Expected Discharge Plan:  Home/Self Care  In-House Referral:  NA  Discharge planning Services  CM Consult  Post Acute Care Choice:  NA Choice offered to:  NA  DME Arranged:    DME Agency:     HH Arranged:    [place] Agency:     Status of Service:  Completed, signed off  Medicare Important Message Given:    Date Medicare IM Given:    Medicare IM give by:    Date Additional Medicare IM Given:    Additional Medicare Important Message give by:     If discussed at [place] of Stay Meetings, dates discussed:    Additional Comments: Pt is independent from home. Pt discharging home today. No CM needs noted.  Sherald Barge, RN 05/25/2015, 2:49 PM

## 2015-05-25 NOTE — Discharge Summary (Signed)
Physician Discharge Summary  Denise Parrish YSA:[id number] DOB: 1922/09/23 DOA: 05/23/2015  PCP: Robert Bellow, MD  Admit date: 05/23/2015 Discharge date: 05/25/2015  Time spent: 40 minutes  Recommendations for Outpatient Follow-up:  1. Follow up with PCP 1-2 weeks for evaluation of SBO. Follow A1c for evaluation of diabetes control.   Discharge Diagnoses:  Principal Problem:   SBO (small bowel obstruction) Active Problems:   Diabetes mellitus, type 2   Hypertension   Aortic sclerosis   Anemia, iron deficiency   Vomiting   Discharge Condition: stable  Diet recommendation: full liquid/soft/low fiber to be advanced by patient as tolerated  Filed Weights   05/23/15 2224 05/24/15 0554 05/25/15 0503  Weight: 74.072 kg (163 lb 4.8 oz) 74 kg (163 lb 2.3 oz) 74.072 kg (163 lb 4.8 oz)    History of present illness:  81 yo female presented on 05/23/15  with one day hx of nausea with vomiting nonbloody with associated abdominal crampiness. She reported its not really "painful". No diarrhea, no fevers. Fights constipation normally, last colonoscopy she reported was about 4 years prior and normal. Denied dysuria, or any recent illnesses. Had never had any abdominal surgeries. Referred for admission for psbo. Pt denied pain at time of admission,  but reported her nausea worsened.   Hospital Course:  SBO (small bowel obstruction)per CT. Admitted and provided with supportive therapy i.e. Analgesia, bowel rest and IV fluids. Mildly elevated lactic acid which normalized with fluids. She quickly improved and diet advanced without problem. At discharge tolerating full liquid diet without pain/nausea/vomiting. Instructed to advance diet slowly as tolerated at home. Will discharge to home with close OP follow up.  . Hypertension: controlled.   . Aortic sclerosis: stable  . Anemia, iron deficiency: stable  . Vomiting: resolved at discharge. See #1.  Diabetes: A1c pending at discharge.  Recommend OP follow up. Continue amaryl.   Procedures:  none  Consultations:  none  Discharge Exam: Filed Vitals:   05/25/15 0503  BP: 122/66  Pulse: 88  Temp: 98.2 F (36.8 C)  Resp: 18    General: well nourished sitting in chair appears comfortable Cardiovascular: RRR no MGR No Le edema Respiratory: normal effort BS clear bilaterally no wheeze  Discharge Instructions   Discharge Instructions    Diet - low sodium heart healthy    Complete by:  As directed      Discharge instructions    Complete by:  As directed   Advance diet slowly as tolerated. Avoid high fiber for now.  Follow up with PCP 5-7 days for evaluation of SBO.     Increase activity slowly    Complete by:  As directed           Current Discharge Medication List    CONTINUE these medications which have NOT CHANGED   Details  aspirin EC 81 MG tablet Take 81 mg by mouth daily.    glimepiride (AMARYL) 2 MG tablet Take 2 mg by mouth daily.    LORazepam (ATIVAN) 0.5 MG tablet Take 0.5 mg by mouth at bedtime.    metoprolol (LOPRESSOR) 50 MG tablet Take 50 mg by mouth 2 (two) times daily.    omeprazole (PRILOSEC) 20 MG capsule Take 20 mg by mouth Daily.      STOP taking these medications     ondansetron (ZOFRAN) 8 MG tablet        No Known Allergies Follow-up Information    Follow up with Robert Bellow, MD.   Specialty:  Family  Medicine   Contact information:   [address] [place] [address] [phone]        The results of significant diagnostics from this hospitalization (including imaging, microbiology, ancillary and laboratory) are listed below for reference.    Significant Diagnostic Studies: Dg Chest 2 View  05/23/2015   CLINICAL DATA:  81 year old female with nausea vomiting and abdominal pain since last night. Initial encounter.  EXAM: CHEST  2 VIEW  COMPARISON:  07/08/2014 and earlier.  FINDINGS: Stable lung volumes. Normal cardiac size and mediastinal  contours. Visualized tracheal air column is within normal limits. Lung parenchyma appears stable. No pneumothorax, pulmonary edema, pleural effusion or consolidation. No pneumoperitoneum. Possible mildly dilated small bowel loops on the lateral view, uncertain. No acute osseous abnormality identified. Chronic cervical ACDF.  IMPRESSION: 1.  No acute cardiopulmonary abnormality. 2. Query abnormal small bowel loops in the abdomen. CT Abdomen and Pelvis is pending at this time.   Electronically Signed   By: Genevie Ann M.D.   On: 05/23/2015 18:51   Ct Abdomen Pelvis W Contrast  05/23/2015   CLINICAL DATA:  81 year old female with vomiting.  EXAM: CT ABDOMEN AND PELVIS WITH CONTRAST  TECHNIQUE: Multidetector CT imaging of the abdomen and pelvis was performed using the standard protocol following bolus administration of intravenous contrast.  CONTRAST:  41mL OMNIPAQUE IOHEXOL 300 MG/ML SOLN, 17mL OMNIPAQUE IOHEXOL 300 MG/ML SOLN  COMPARISON:  Pelvic MRI dated 09/26/2013 and abdominal CT dated 09/03/2013.  FINDINGS: Stable minimal right lung base linear atelectasis/ scarring. The visualized lung bases are otherwise clear. There is no intra-abdominal free air or free fluid.  The liver appears unremarkable. There is a stone at the neck of the gallbladder. There is no pericholecystic fluid or inflammatory changes of the gallbladder. The pancreas, spleen, and right adrenal gland appear unremarkable. A 1 cm indeterminate left adrenal nodule, possibly an adenoma. There is moderate bilateral renal atrophy, left greater right similar to the prior study. There is a stable 3.1 cm left renal upper pole cyst. Multiple bilateral renal subcentimeter hypodense lesions are too small to characterize. There is stable fullness of the right renal pelvis likely related to an extrarenal pelvis. There is no hydronephrosis on either side. The visualized ureters and urinary bladder appear unremarkable.  Evaluation of the pelvis is limited due to  streak artifact caused by metallic right hip arthroplasty. There is limited visualization and evaluation of the uterus. A grossly stable 2.7 x 3.2 cm fluid attenuating structure seen in the right posterior hemipelvis (series 2, image 60) corresponds to the cystic lesion seen on the prior CT and MR.  There is mild dilatation of loops of proximal and mid small bowel measuring up to 3 cm in diameter. The distal small bowel and terminal ileum demonstrate a normal caliber. A transition zone is noted in the right anterior hemipelvis. There is extensive diverticulosis of the sigmoid colon without active inflammation. There are scattered diverticula along the transverse colon as well. The appendix is unremarkable.  Moderate aortoiliac atherosclerotic disease. The origins of the celiac axis, SMA, IMA as well as the origins of the renal arteries are patent. No portal venous gas identified. There is no lymphadenopathy. There is degenerative changes of the spine. A metallic right hip arthroplasty is noted. No acute fracture.  IMPRESSION: Early versus low grade small-bowel obstruction with transition zone in the right anterior hemipelvis. Clinical correlation and follow-up recommended.  Extensive colonic diverticulosis without active inflammation.  Stable right adnexal cystic lesion.  Electronically Signed   By: Anner Crete M.D.   On: 05/23/2015 19:38    Microbiology: No results found for this or any previous visit (from the past 240 hour(s)).   Labs: Basic Metabolic Panel:  Recent Labs Lab 05/23/15 1728 05/23/15 2312 05/24/15 0535  NA 142  --  141  K 3.7  --  3.5  CL 101  --  109  CO2 28  --  27  GLUCOSE 161*  --  100*  BUN 18  --  16  CREATININE 1.09* 0.98 0.91  CALCIUM 9.5  --  8.2*  MG 1.8  --   --    Liver Function Tests:  Recent Labs Lab 05/23/15 1728  AST 34  ALT 32  ALKPHOS 83  BILITOT 1.4*  PROT 7.4  ALBUMIN 3.8    Recent Labs Lab 05/23/15 1728  LIPASE 20*   No results for  input(s): AMMONIA in the last 168 hours. CBC:  Recent Labs Lab 05/23/15 1728 05/23/15 2312 05/24/15 0535  WBC 11.6* 7.4 7.0  NEUTROABS 8.2*  --   --   HGB 13.8 12.6 11.6*  HCT 41.0 37.8 35.2*  MCV 90.1 90.6 92.1  PLT 298 253 233   Cardiac Enzymes:  Recent Labs Lab 05/23/15 1728  TROPONINI <0.03   BNP: BNP (last 3 results) No results for input(s): BNP in the last 8760 hours.  ProBNP (last 3 results) No results for input(s): PROBNP in the last 8760 hours.  CBG:  Recent Labs Lab 05/24/15 2154 05/24/15 2352 05/25/15 0347 05/25/15 0902 05/25/15 1129  GLUCAP 83 81 86 121* 113*       Signed:  BLACK,KAREN M  [facility] 05/25/2015, 1:53 PM

## 2015-05-26 ENCOUNTER — Telehealth: Payer: Self-pay | Admitting: Internal Medicine

## 2015-05-26 LAB — HEMOGLOBIN A1C
Hgb A1c MFr Bld: 6 % — ABNORMAL HIGH (ref 4.8–5.6)
Mean Plasma Glucose: 126 mg/dL

## 2015-05-26 NOTE — Telephone Encounter (Signed)
Spoke with the pt and her daughter- Denise Parrish- she was in the hospital for 3 days and on a liquid diet. Pt said they did not give her any reflux medication while she was there. Last night and this morning she had a lot of reflux. She refused to go to the ED and Dr.Knowlton is out of the office this week, so they called here. When I spoke with the pt she stated she was feeling better now. Her daughter said they gave her some reflux medication and her anxiety medication and she was feeling better. I advised her that she needed an ov and that if she gets worse again she should go to the ED. Pt verbalized understanding and said she was feeling better now and that she would just like to have an appt with [place].   [name], please schedule ov.

## 2015-05-26 NOTE — Telephone Encounter (Signed)
Pt's daughter(Mrs Donneta Romberg) called to let us know that patient was in the ER from Sunday to yesterday afternoon. She was told she has a partial block intestine and since coming home she has severe heartburn and wants to be seen today. I told her we do not have anything today and next available will be the last week of September. Please advise and call [phone] or [phone]

## 2015-05-27 ENCOUNTER — Encounter: Payer: Self-pay | Admitting: Internal Medicine

## 2015-05-27 NOTE — Telephone Encounter (Signed)
OV MADE °

## 2015-05-27 NOTE — Telephone Encounter (Signed)
APPT MADE AND LETTER SENT  °

## 2015-05-27 NOTE — Telephone Encounter (Signed)
Noted  

## 2015-05-28 ENCOUNTER — Emergency Department (HOSPITAL_COMMUNITY): Payer: Medicare Other

## 2015-05-28 ENCOUNTER — Encounter (HOSPITAL_COMMUNITY): Payer: Self-pay | Admitting: *Deleted

## 2015-05-28 ENCOUNTER — Emergency Department (HOSPITAL_COMMUNITY)
Admission: EM | Admit: 2015-05-28 | Discharge: 2015-05-28 | Disposition: A | Discharge status: Home / Self Care | Payer: Medicare Other | Attending: Emergency Medicine | Admitting: Emergency Medicine

## 2015-05-28 DIAGNOSIS — R011 Cardiac murmur, unspecified: Secondary | ICD-10-CM | POA: Diagnosis not present

## 2015-05-28 DIAGNOSIS — Z79899 Other long term (current) drug therapy: Secondary | ICD-10-CM | POA: Diagnosis not present

## 2015-05-28 DIAGNOSIS — Z862 Personal history of diseases of the blood and blood-forming organs and certain disorders involving the immune mechanism: Secondary | ICD-10-CM | POA: Diagnosis not present

## 2015-05-28 DIAGNOSIS — Z7982 Long term (current) use of aspirin: Secondary | ICD-10-CM | POA: Diagnosis not present

## 2015-05-28 DIAGNOSIS — E669 Obesity, unspecified: Secondary | ICD-10-CM | POA: Insufficient documentation

## 2015-05-28 DIAGNOSIS — E1140 Type 2 diabetes mellitus with diabetic neuropathy, unspecified: Secondary | ICD-10-CM | POA: Diagnosis not present

## 2015-05-28 DIAGNOSIS — R1033 Periumbilical pain: Secondary | ICD-10-CM | POA: Diagnosis not present

## 2015-05-28 DIAGNOSIS — R112 Nausea with vomiting, unspecified: Secondary | ICD-10-CM

## 2015-05-28 DIAGNOSIS — F419 Anxiety disorder, unspecified: Secondary | ICD-10-CM | POA: Diagnosis not present

## 2015-05-28 DIAGNOSIS — I10 Essential (primary) hypertension: Secondary | ICD-10-CM | POA: Insufficient documentation

## 2015-05-28 DIAGNOSIS — J439 Emphysema, unspecified: Secondary | ICD-10-CM | POA: Diagnosis not present

## 2015-05-28 DIAGNOSIS — Z8739 Personal history of other diseases of the musculoskeletal system and connective tissue: Secondary | ICD-10-CM | POA: Insufficient documentation

## 2015-05-28 DIAGNOSIS — K219 Gastro-esophageal reflux disease without esophagitis: Secondary | ICD-10-CM | POA: Diagnosis not present

## 2015-05-28 DIAGNOSIS — Z87891 Personal history of nicotine dependence: Secondary | ICD-10-CM | POA: Insufficient documentation

## 2015-05-28 LAB — CBC WITH DIFFERENTIAL/PLATELET
Basophils Absolute: 0 10*3/uL (ref 0.0–0.1)
Basophils Relative: 0 % (ref 0–1)
Eosinophils Absolute: 0.2 10*3/uL (ref 0.0–0.7)
Eosinophils Relative: 2 % (ref 0–5)
HCT: 39.4 % (ref 36.0–46.0)
Hemoglobin: 13.5 g/dL (ref 12.0–15.0)
Lymphocytes Relative: 25 % (ref 12–46)
Lymphs Abs: 2.7 10*3/uL (ref 0.7–4.0)
MCH: 30.4 pg (ref 26.0–34.0)
MCHC: 34.3 g/dL (ref 30.0–36.0)
MCV: 88.7 fL (ref 78.0–100.0)
Monocytes Absolute: 1.1 10*3/uL — ABNORMAL HIGH (ref 0.1–1.0)
Monocytes Relative: 10 % (ref 3–12)
Neutro Abs: 7.1 10*3/uL (ref 1.7–7.7)
Neutrophils Relative %: 63 % (ref 43–77)
Platelets: 291 10*3/uL (ref 150–400)
RBC: 4.44 MIL/uL (ref 3.87–5.11)
RDW: 13.7 % (ref 11.5–15.5)
WBC: 11 10*3/uL — ABNORMAL HIGH (ref 4.0–10.5)

## 2015-05-28 LAB — BASIC METABOLIC PANEL
Anion gap: 10 (ref 5–15)
BUN: 13 mg/dL (ref 6–20)
CO2: 24 mmol/L (ref 22–32)
Calcium: 9.3 mg/dL (ref 8.9–10.3)
Chloride: 103 mmol/L (ref 101–111)
Creatinine, Ser: 1.05 mg/dL — ABNORMAL HIGH (ref 0.44–1.00)
GFR calc Af Amer: 53 mL/min — ABNORMAL LOW (ref >60)
GFR calc non Af Amer: 45 mL/min — ABNORMAL LOW (ref >60)
Glucose, Bld: 134 mg/dL — ABNORMAL HIGH (ref 65–99)
Potassium: 3.9 mmol/L (ref 3.5–5.1)
Sodium: 137 mmol/L (ref 135–145)

## 2015-05-28 LAB — I-STAT CG4 LACTIC ACID, ED: Lactic Acid, Venous: 1.5 mmol/L (ref 0.5–2.0)

## 2015-05-28 MED ORDER — ONDANSETRON HCL 4 MG/2ML IJ SOLN
4.0000 mg | Freq: Once | INTRAMUSCULAR | Status: AC
  Administered 2015-05-28: 4 mg via INTRAVENOUS
  Filled 2015-05-28: qty 2

## 2015-05-28 MED ORDER — IOHEXOL 300 MG/ML  SOLN
50.0000 mL | Freq: Once | INTRAMUSCULAR | Status: AC | PRN
  Administered 2015-05-28: 50 mL via ORAL

## 2015-05-28 MED ORDER — SODIUM CHLORIDE 0.9 % IV SOLN
Freq: Once | INTRAVENOUS | Status: AC
  Administered 2015-05-28: 01:00:00 via INTRAVENOUS

## 2015-05-28 MED ORDER — ONDANSETRON HCL 4 MG PO TABS: 4.0000 mg | ORAL_TABLET | Freq: Four times a day (QID) | ORAL | Status: DC | PRN

## 2015-05-28 MED ORDER — IOHEXOL 300 MG/ML  SOLN
100.0000 mL | Freq: Once | INTRAMUSCULAR | Status: AC | PRN
  Administered 2015-05-28: 100 mL via INTRAVENOUS

## 2015-05-28 NOTE — ED Provider Notes (Signed)
CSN: [id number]     Arrival date & time 05/28/15  0053 History   First MD Initiated Contact with Patient 05/28/15 0106     Chief Complaint  Patient presents with  . Nausea  . Emesis     (Consider location/radiation/quality/duration/timing/severity/associated sxs/prior Treatment) Patient is a 81 y.o. female presenting with vomiting. The history is provided by the patient.  Emesis She was recently admitted to the hospital for a small bowel obstruction and was discharged 2 days ago. She felt well at discharge, but started having nausea. Nausea has been waxing and waning. She started vomiting this evening. She denies abdominal pain and denies fever, chills, sweats. Of note, she has no history of abdominal surgery. She did have one and antiemetics pill at home which she did take which gave her some slight relief.  Past Medical History  Diagnosis Date  . Degenerative joint disease     Right THA in 1995; cervical discectomy and fusion-2001  . Diabetes mellitus, type 2     + neuropathy  . Hypertension   . Gastroesophageal reflux disease   . Aortic sclerosis     Long-standing heart murmur  . Emphysema   . Anxiety   . Sigmoid diverticulosis   . Hyperlipidemia   . Obesity   . Cholelithiasis     on u/s 09/2011  . Osteoporosis   . Chest pain 2006    +palpitations; minimal CAD at cath in 2005; normal EF; negative stress echo in 09/2008  . IDA (iron deficiency anemia)     labs 09/2011  . Hiatal hernia   . Small bowel lesion     On Given's capsule; Dr Kathleene Hazel 07/2012, no further w/u needed   Past Surgical History  Procedure Laterality Date  . Anterior cervical discectomy  2001    C4-5, allograft, fixation  . Tonsillectomy    . Total hip arthroplasty  1995    Right  . Colonoscopy  2006  . Cataract extraction w/ intraocular lens implant  2007    Left  . Breast lumpectomy      benign  . Colonoscopy  04/03/12    Dr. Ok Edwards diverticulosis, negative microscopic  colitis   .  Esophagogastroduodenoscopy  04/03/12    Dr. Jennet Maduro hernia, chronic gastritis on bx  . Givens capsule study  05/09/2012    RMR: an unclear raised area of small bowel was noted, with features almost  characteristic of very small polyp. This was without villous  blunting or any evidence of active bleeding; yet, the area of  concern appeared to be erythematous. However, this could simply  be a light reflection on a normal variation of the small bowel. REFERRED TO DR. GILLIAM, APPT FOR NOVEMBER 2013.    Family History  Problem Relation Age of Onset  . Cancer Brother     lung, age 66  . Diabetes Mother     + brother  . Heart failure Father   . Hypertension Father     + brother  . Colon cancer Neg Hx   . Uterine cancer Sister    Social History  Substance Use Topics  . Smoking status: Former Smoker -- 1.00 packs/day for 30 years    Types: Cigarettes    Quit date: 10/15/1970  . Smokeless tobacco: Former Systems developer    Quit date: 09/19/1971     Comment: Quit x 50 years  . Alcohol Use: No   OB History    No data available     Review  of Systems  Gastrointestinal: Positive for vomiting.  All other systems reviewed and are negative.     Allergies  Review of patient's allergies indicates no known allergies.  Home Medications   Prior to Admission medications   Medication Sig Start Date End Date Taking? Authorizing Provider  aspirin EC 81 MG tablet Take 81 mg by mouth daily.    Historical Provider, MD  glimepiride (AMARYL) 2 MG tablet Take 2 mg by mouth daily.    Historical Provider, MD  LORazepam (ATIVAN) 0.5 MG tablet Take 0.5 mg by mouth at bedtime.    Historical Provider, MD  metoprolol (LOPRESSOR) 50 MG tablet Take 50 mg by mouth 2 (two) times daily.    Historical Provider, MD  omeprazole (PRILOSEC) 20 MG capsule Take 20 mg by mouth Daily. 08/05/12   Historical Provider, MD   BP 122/64 mmHg  Pulse 65  Temp(Src) 98.4 F (36.9 C) (Oral)  Resp 18  SpO2 98% Physical Exam   Nursing note and vitals reviewed.  81 year old female, resting comfortably and in no acute distress. Vital signs are normal. Oxygen saturation is 98%, which is normal. Head is normocephalic and atraumatic. PERRLA, EOMI. Oropharynx is clear. Neck is nontender and supple without adenopathy or JVD. Back is nontender and there is no CVA tenderness. Lungs are clear without rales, wheezes, or rhonchi. Chest is nontender. Heart has regular rate and rhythm without murmur. Abdomen is soft, flat, with mild periumbilical tenderness. There is no rebound or guarding. There are no masses or hepatosplenomegaly and peristalsis is hypoactive. Extremities have no cyanosis or edema, full range of motion is present. Skin is warm and dry without rash. Neurologic: Mental status is normal, cranial nerves are intact, there are no motor or sensory deficits.  ED Course  Procedures (including critical care time) Labs Review Results for orders placed or performed during the hospital encounter of 03/47/42  Basic metabolic panel  Result Value Ref Range   Sodium 137 135 - 145 mmol/L   Potassium 3.9 3.5 - 5.1 mmol/L   Chloride 103 101 - 111 mmol/L   CO2 24 22 - 32 mmol/L   Glucose, Bld 134 (H) 65 - 99 mg/dL   BUN 13 6 - 20 mg/dL   Creatinine, Ser 1.05 (H) 0.44 - 1.00 mg/dL   Calcium 9.3 8.9 - 10.3 mg/dL   GFR calc non Af Amer 45 (L) >60 mL/min   GFR calc Af Amer 53 (L) >60 mL/min   Anion gap 10 5 - 15  CBC with Differential  Result Value Ref Range   WBC 11.0 (H) 4.0 - 10.5 K/uL   RBC 4.44 3.87 - 5.11 MIL/uL   Hemoglobin 13.5 12.0 - 15.0 g/dL   HCT 39.4 36.0 - 46.0 %   MCV 88.7 78.0 - 100.0 fL   MCH 30.4 26.0 - 34.0 pg   MCHC 34.3 30.0 - 36.0 g/dL   RDW 13.7 11.5 - 15.5 %   Platelets 291 150 - 400 K/uL   Neutrophils Relative % 63 43 - 77 %   Neutro Abs 7.1 1.7 - 7.7 K/uL   Lymphocytes Relative 25 12 - 46 %   Lymphs Abs 2.7 0.7 - 4.0 K/uL   Monocytes Relative 10 3 - 12 %   Monocytes Absolute 1.1 (H)  0.1 - 1.0 K/uL   Eosinophils Relative 2 0 - 5 %   Eosinophils Absolute 0.2 0.0 - 0.7 K/uL   Basophils Relative 0 0 - 1 %   Basophils  Absolute 0.0 0.0 - 0.1 K/uL  I-Stat CG4 Lactic Acid, ED  Result Value Ref Range   Lactic Acid, Venous 1.50 0.5 - 2.0 mmol/L   Imaging Review Ct Abdomen Pelvis W Contrast  05/28/2015   CLINICAL DATA:  81 year old female with nausea no vomiting. Recent history of small-bowel obstruction.  EXAM: CT ABDOMEN AND PELVIS WITH CONTRAST  TECHNIQUE: Multidetector CT imaging of the abdomen and pelvis was performed using the standard protocol following bolus administration of intravenous contrast.  CONTRAST:  51mL OMNIPAQUE IOHEXOL 300 MG/ML SOLN, 165mL OMNIPAQUE IOHEXOL 300 MG/ML SOLN  COMPARISON:  Radiograph dated 05/28/2015 and CT dated 05/23/2015.  FINDINGS: The visualized lung bases are clear. Calcification of the mitral annulus is partially visualized. No intra-abdominal free air. Trace perihepatic free fluid.  A noncalcified stone is noted within the gallbladder. There is no inflammatory changes of the gallbladder or pericholecystic fluid. The liver, pancreas, spleen, and right adrenal gland appear unremarkable. There is minimal nodularity of the left adrenal gland. Grossly stable left renal upper pole cyst. Subcentimeter left renal parenchymal hypodense lesion is too small to characterize. There is moderate stable left renal atrophy. There is no hydronephrosis on the left. An extrarenal pelvis is seen on the right. There is mild right hydronephrosis, increased from prior study. The right ureter demonstrates normal caliber. No stone identified. A right UPJ stricture or urothelial lesion is not excluded. The urinary bladder is unremarkable. The uterus is grossly unremarkable. A 2.8 x 3.1 cm fluid density structure is again noted within the right hemipelvis similar to prior study  Evaluation of the pelvis is limited due to streak artifact caused by metallic right hip arthroplasty.  There is extensive sigmoid diverticulosis without active inflammation. Oral contrast from prior study as traverses into the colon and the seen within the rectosigmoid. The multiple mildly dilated fluid-filled loops of bowel noted in the mid abdomen grossly similar in caliber since the prior study. The distal small bowel and terminal ileum demonstrated normal caliber. There is mild thickening of a short segment of small bowel in the left anterior abdomen (series 2, image 56) concerning for focal enteritis. The appendix is unremarkable.  There is aortoiliac atherosclerotic disease. There is no adenopathy. There is extensive degenerative changes of the spine. No acute fracture. Right hip arthroplasty.  IMPRESSION: Segmental thickening of the small bowel in the left hemiabdomen most compatible with enteritis. Dilated loops of small bowel proximal to this area likely represent reactive ileus. Small-bowel obstruction is less likely. Follow-up recommended.  Mild right hydronephrosis new from prior study.   Electronically Signed   By: Anner Crete M.D.   On: 05/28/2015 03:04   Dg Abd Acute W/chest  05/28/2015   CLINICAL DATA:  81 year old female with nausea and vomiting  EXAM: DG ABDOMEN ACUTE W/ 1V CHEST  COMPARISON:  CT dated 05/23/2015  FINDINGS: Mildly dilated air-filled loop of bowel noted in the lower abdomen which may represent an ileus versus less likely a partial/ early small bowel obstruction. Clinical correlation and follow-up recommended. Air and stool is however seen within the colon. There is no free intra-abdominal air. No radiopaque calculi identified. There is degenerative changes of the spine. Right hip arthroplasty.  There are emphysematous changes of the lungs with no focal consolidation, pleural effusion, or pneumothorax. Top-normal cardiac size. Partially visualized cervical spine fixation plate and screws.  IMPRESSION: Segmental ileus versus less likely an early/ low grade small bowel  obstruction. Clinical correlation and follow-up recommended.   Electronically Signed  By: Anner Crete M.D.   On: 05/28/2015 01:41   I have personally reviewed and evaluated these images and lab results as part of my medical decision-making.  MDM   Final diagnoses:  Nausea and vomiting, vomiting of unspecified type    Nausea and abdominal tenderness worrisome for ongoing or recurrent bowel obstruction. Old records are reviewed confirming recent hospitalization for small bowel obstruction. I'm concerned about the fact that she had a bowel obstruction without prior abdominal surgery. This is rather unusual since scar tissue from prior surgery is the most common reason for about obstruction. She will be given ondansetron for nausea, screening labs obtained, and will be sent for abdominal x-rays.  Laboratory workup shows minimal elevation of WBC without left shift. Plain x-rays were suggestive of focal ileus. She was sent for CT scan which suggested enteritis with focal ileus. In the meantime, patient had received IV fluids and ondansetron and stated that she was feeling much better and wished to go home. Repeat abdominal exam is benign. It was felt reasonable to attempt a trial at home. She is discharged with a prescription for ondansetron and is to follow-up with her PCP in the next several days. Return precautions given and patient and family expressed understanding.  Delora Fuel, MD 95/62/13 0865

## 2015-05-28 NOTE — ED Notes (Signed)
Family reporting pt recently hospitalized for SBO, and reports that pt has continued to have problems since that time.

## 2015-05-28 NOTE — ED Notes (Signed)
Patient and family verbalize understanding of discharge instructions, prescription medications, home care and follow up care. Patient out of department at this time with family. 

## 2015-05-28 NOTE — Discharge Instructions (Signed)
Return if nausea and vomiting or not being controlled adequately with ondansetron.  Nausea and Vomiting Nausea is a sick feeling that often comes before throwing up (vomiting). Vomiting is a reflex where stomach contents come out of your mouth. Vomiting can cause severe loss of body fluids (dehydration). Children and elderly adults can become dehydrated quickly, especially if they also have diarrhea. Nausea and vomiting are symptoms of a condition or disease. It is important to find the cause of your symptoms. CAUSES   Direct irritation of the stomach lining. This irritation can result from increased acid production (gastroesophageal reflux disease), infection, food poisoning, taking certain medicines (such as nonsteroidal anti-inflammatory drugs), alcohol use, or tobacco use.  Signals from the brain.These signals could be caused by a headache, heat exposure, an inner ear disturbance, increased pressure in the brain from injury, infection, a tumor, or a concussion, pain, emotional stimulus, or metabolic problems.  An obstruction in the gastrointestinal tract (bowel obstruction).  Illnesses such as diabetes, hepatitis, gallbladder problems, appendicitis, kidney problems, cancer, sepsis, atypical symptoms of a heart attack, or eating disorders.  Medical treatments such as chemotherapy and radiation.  Receiving medicine that makes you sleep (general anesthetic) during surgery. DIAGNOSIS Your caregiver may ask for tests to be done if the problems do not improve after a few days. Tests may also be done if symptoms are severe or if the reason for the nausea and vomiting is not clear. Tests may include:  Urine tests.  Blood tests.  Stool tests.  Cultures (to look for evidence of infection).  X-rays or other imaging studies. Test results can help your caregiver make decisions about treatment or the need for additional tests. TREATMENT You need to stay well hydrated. Drink frequently but in  small amounts.You may wish to drink water, sports drinks, clear broth, or eat frozen ice pops or gelatin dessert to help stay hydrated.When you eat, eating slowly may help prevent nausea.There are also some antinausea medicines that may help prevent nausea. HOME CARE INSTRUCTIONS   Take all medicine as directed by your caregiver.  If you do not have an appetite, do not force yourself to eat. However, you must continue to drink fluids.  If you have an appetite, eat a normal diet unless your caregiver tells you differently.  Eat a variety of complex carbohydrates (rice, wheat, potatoes, bread), lean meats, yogurt, fruits, and vegetables.  Avoid high-fat foods because they are more difficult to digest.  Drink enough water and fluids to keep your urine clear or pale yellow.  If you are dehydrated, ask your caregiver for specific rehydration instructions. Signs of dehydration may include:  Severe thirst.  Dry lips and mouth.  Dizziness.  Dark urine.  Decreasing urine frequency and amount.  Confusion.  Rapid breathing or pulse. SEEK IMMEDIATE MEDICAL CARE IF:   You have blood or brown flecks (like coffee grounds) in your vomit.  You have black or bloody stools.  You have a severe headache or stiff neck.  You are confused.  You have severe abdominal pain.  You have chest pain or trouble breathing.  You do not urinate at least once every 8 hours.  You develop cold or clammy skin.  You continue to vomit for longer than 24 to 48 hours.  You have a fever. MAKE SURE YOU:   Understand these instructions.  Will watch your condition.  Will get help right away if you are not doing well or get worse. Document Released: 09/04/2005 Document Revised: 11/27/2011 Document  Reviewed: 02/01/2011 ExitCare Patient Information 2015 [place], [place]. This information is not intended to replace advice given to you by your health care provider. Make sure you discuss any questions you  have with your health care provider.  Ondansetron tablets What is this medicine? ONDANSETRON (on DAN se tron) is used to treat nausea and vomiting caused by chemotherapy. It is also used to prevent or treat nausea and vomiting after surgery. This medicine may be used for other purposes; ask your health care provider or pharmacist if you have questions. COMMON BRAND NAME(S): Zofran What should I tell my health care provider before I take this medicine? They need to know if you have any of these conditions: -heart disease -history of irregular heartbeat -liver disease -low levels of magnesium or potassium in the blood -an unusual or allergic reaction to ondansetron, granisetron, other medicines, foods, dyes, or preservatives -pregnant or trying to get pregnant -breast-feeding How should I use this medicine? Take this medicine by mouth with a glass of water. Follow the directions on your prescription label. Take your doses at regular intervals. Do not take your medicine more often than directed. Talk to your pediatrician regarding the use of this medicine in children. Special care may be needed. Overdosage: If you think you have taken too much of this medicine contact a poison control center or emergency room at once. NOTE: This medicine is only for you. Do not share this medicine with others. What if I miss a dose? If you miss a dose, take it as soon as you can. If it is almost time for your next dose, take only that dose. Do not take double or extra doses. What may interact with this medicine? Do not take this medicine with any of the following medications: -apomorphine -certain medicines for fungal infections like fluconazole, itraconazole, ketoconazole, posaconazole, voriconazole -cisapride -dofetilide -dronedarone -pimozide -thioridazine -ziprasidone This medicine may also interact with the following medications: -carbamazepine -certain medicines for depression, anxiety, or  psychotic disturbances -fentanyl -linezolid -MAOIs like Carbex, Eldepryl, Marplan, Nardil, and Parnate -methylene blue (injected into a vein) -other medicines that prolong the QT interval (cause an abnormal heart rhythm) -phenytoin -rifampicin -tramadol This list may not describe all possible interactions. Give your health care provider a list of all the medicines, herbs, non-prescription drugs, or dietary supplements you use. Also tell them if you smoke, drink alcohol, or use illegal drugs. Some items may interact with your medicine. What should I watch for while using this medicine? Check with your doctor or health care professional right away if you have any sign of an allergic reaction. What side effects may I notice from receiving this medicine? Side effects that you should report to your doctor or health care professional as soon as possible: -allergic reactions like skin rash, itching or hives, swelling of the face, lips or tongue -breathing problems -confusion -dizziness -fast or irregular heartbeat -feeling faint or lightheaded, falls -fever and chills -loss of balance or coordination -seizures -sweating -swelling of the hands or feet -tightness in the chest -tremors -unusually weak or tired Side effects that usually do not require medical attention (report to your doctor or health care professional if they continue or are bothersome): -constipation or diarrhea -headache This list may not describe all possible side effects. Call your doctor for medical advice about side effects. You may report side effects to FDA at 1-800-FDA-1088. Where should I keep my medicine? Keep out of the reach of children. Store between 2 and 30 degrees C (  36 and 86 degrees F). Throw away any unused medicine after the expiration date. NOTE: This sheet is a summary. It may not cover all possible information. If you have questions about this medicine, talk to your doctor, pharmacist, or health care  provider.  2015, [employer]vier/[employer] Standard. (2013-06-11 16:27:45)

## 2015-06-01 MED FILL — Ondansetron HCl Tab 4 MG: ORAL | Qty: 4 | Status: AC

## 2015-06-16 ENCOUNTER — Ambulatory Visit (INDEPENDENT_AMBULATORY_CARE_PROVIDER_SITE_OTHER): Payer: Medicare Other | Admitting: Gastroenterology

## 2015-06-16 ENCOUNTER — Encounter: Payer: Self-pay | Admitting: Gastroenterology

## 2015-06-16 VITALS — BP 107/58 | HR 54 | Temp 97.2°F | Ht 65.0 in | Wt 163.8 lb

## 2015-06-16 DIAGNOSIS — K219 Gastro-esophageal reflux disease without esophagitis: Secondary | ICD-10-CM | POA: Diagnosis not present

## 2015-06-16 DIAGNOSIS — K5000 Crohn's disease of small intestine without complications: Secondary | ICD-10-CM | POA: Insufficient documentation

## 2015-06-16 DIAGNOSIS — K50018 Crohn's disease of small intestine with other complication: Secondary | ICD-10-CM | POA: Diagnosis not present

## 2015-06-16 NOTE — Assessment & Plan Note (Signed)
Suspected segmental enteritis with reactive ileus versus partial SBO. Second CT improved. Patient now completely asymptomatic. Previous small bowel lesion on capsule study in 2014, felt to be insignificant by Dr. Arsenio Loader at [facility] not contributing to recent clinical scenario.   To discuss findings with Dr. Gala Romney. Unlikely to need further evaluation unless she has recurrent symptoms.

## 2015-06-16 NOTE — Progress Notes (Signed)
CC'D TO PCP °

## 2015-06-16 NOTE — Assessment & Plan Note (Signed)
Recent flare with acute illness but now back under control. Continue omeprazole 37m daily before breakfast.

## 2015-06-16 NOTE — Progress Notes (Signed)
Primary Care Physician: Robert Bellow, MD  Primary Gastroenterologist:  Garfield Cornea, MD   Chief Complaint  Patient presents with  . Follow-up    HPI: Denise Parrish is a 81 y.o. female here for follow up. H/O GERD, IDA, SB lesion (on capsule study) evaluated by Dr. Arsenio Loader couple of years ago and thought to be unremarkable.  Recent hospitalization for possible partial SBO versus ileus from segmental thickening of small bowel (enteritis) Distal small bowel and TI normal. Two CTs 9/4 and 9/9. Mild right hydronephrosis seen on second one. Also with gallstone in gallbladder neck, normal LFTs.  Patient recalls her symptoms started abruptly the day before admission. N/V without abdominal pain. Normal BM the day of onset of symptoms. Generally has BM most days. Hospitalized two days but had to return to ER two days later with recurrent N/V. Second CT more suggestive of segmental enteritis with reactive ileus.   Patient states she finally felt the "blockage pass" after her second CT. Pass a lot of hard stool and then loose stool. Symptoms have resolved and she states she feels great. Appetite back. No melena, brbpr. heartburn back under control. Had flare during her illness because she did not receive her omeprazole during hospitalization.    Intentional weight loss of 30 pounds in the past one year. Denies urinary symptoms outside of chronic urinary incontinence.    Current Outpatient Prescriptions  Medication Sig Dispense Refill  . aspirin EC 81 MG tablet Take 81 mg by mouth daily.    . Cholecalciferol (VITAMIN D-3 PO) Take 5,000 Units by mouth.    [name] glimepiride (AMARYL) 2 MG tablet Take 2 mg by mouth daily.    [name] LORazepam (ATIVAN) 0.5 MG tablet Take 0.5 mg by mouth at bedtime.    . metoprolol (LOPRESSOR) 50 MG tablet Take 50 mg by mouth 2 (two) times daily.    . Multiple Vitamins-Minerals (VISION VITAMINS) TABS Take 1 tablet by mouth daily.    [name] omeprazole (PRILOSEC) 20 MG  capsule Take 20 mg by mouth Daily.    . ondansetron (ZOFRAN) 4 MG tablet Take 1 tablet (4 mg total) by mouth every 6 (six) hours as needed for nausea. 4 tablet 0   No current facility-administered medications for this visit.    Allergies as of 06/16/2015  . (No Known Allergies)   Past Medical History  Diagnosis Date  . Degenerative joint disease     Right THA in 1995; cervical discectomy and fusion-2001  . Diabetes mellitus, type 2     + neuropathy  . Hypertension   . Gastroesophageal reflux disease   . Aortic sclerosis     Long-standing heart murmur  . Emphysema   . Anxiety   . Sigmoid diverticulosis   . Hyperlipidemia   . Obesity   . Cholelithiasis     on u/s 09/2011  . Osteoporosis   . Chest pain 2006    +palpitations; minimal CAD at cath in 2005; normal EF; negative stress echo in 09/2008  . IDA (iron deficiency anemia)     labs 09/2011  . Hiatal hernia   . Small bowel lesion     On Given's capsule; Dr Kathleene Hazel 07/2012, no further w/u needed   Past Surgical History  Procedure Laterality Date  . Anterior cervical discectomy  2001    C4-5, allograft, fixation  . Tonsillectomy    . Total hip arthroplasty  1995    Right  . Colonoscopy  2006  .  Cataract extraction w/ intraocular lens implant  2007    Left  . Breast lumpectomy      benign  . Colonoscopy  04/03/12    Dr. Ok Edwards diverticulosis, negative microscopic  colitis   . Esophagogastroduodenoscopy  04/03/12    Dr. Jennet Maduro hernia, chronic gastritis on bx  . Givens capsule study  05/09/2012    RMR: an unclear raised area of small bowel was noted, with features almost  characteristic of very small polyp. This was without villous  blunting or any evidence of active bleeding; yet, the area of  concern appeared to be erythematous. However, this could simply  be a light reflection on a normal variation of the small bowel. REFERRED TO DR. GILLIAM, APPT FOR NOVEMBER 2013.     ROS:  General: Negative for  anorexia, weight loss, fever, chills, fatigue, weakness. ENT: Negative for hoarseness, difficulty swallowing , nasal congestion. CV: Negative for chest pain, angina, palpitations, dyspnea on exertion, peripheral edema.  Respiratory: Negative for dyspnea at rest, dyspnea on exertion, cough, sputum, wheezing.  GI: See history of present illness. GU:  Negative for dysuria, hematuria, urinary incontinence, urinary frequency, nocturnal urination.  Endo: Negative for unusual weight change.    Physical Examination:   BP 107/58 mmHg  Pulse 54  Temp(Src) 97.2 F (36.2 C)  Ht 5\' 5"  (1.651 m)  Wt 163 lb 12.8 oz (74.299 kg)  BMI 27.26 kg/m2  General: Well-nourished, well-developed in no acute distress.  Eyes: No icterus. Mouth: Oropharyngeal mucosa moist and pink , no lesions erythema or exudate. Lungs: Clear to auscultation bilaterally.  Heart: Regular rate and rhythm, no murmurs rubs or gallops.  Abdomen: Bowel sounds are normal, nontender, nondistended, no hepatosplenomegaly or masses, no abdominal bruits or hernia , no rebound or guarding.   Extremities: No lower extremity edema. No clubbing or deformities. Neuro: Alert and oriented x 4   Skin: Warm and dry, no jaundice.   Psych: Alert and cooperative, normal mood and affect.  Labs:  Lab Results  Component Value Date   WBC 11.0* 05/28/2015   HGB 13.5 05/28/2015   HCT 39.4 05/28/2015   MCV 88.7 05/28/2015   PLT 291 05/28/2015   Lab Results  Component Value Date   CREATININE 1.05* 05/28/2015   BUN 13 05/28/2015   NA 137 05/28/2015   K 3.9 05/28/2015   CL 103 05/28/2015   CO2 24 05/28/2015   Lab Results  Component Value Date   ALT 32 05/23/2015   AST 34 05/23/2015   ALKPHOS 83 05/23/2015   BILITOT 1.4* 05/23/2015   Lab Results  Component Value Date   LIPASE 20* 05/23/2015     Imaging Studies: Dg Chest 2 View  05/23/2015   CLINICAL DATA:  81 year old female with nausea vomiting and abdominal pain since last night.  Initial encounter.  EXAM: CHEST  2 VIEW  COMPARISON:  07/08/2014 and earlier.  FINDINGS: Stable lung volumes. Normal cardiac size and mediastinal contours. Visualized tracheal air column is within normal limits. Lung parenchyma appears stable. No pneumothorax, pulmonary edema, pleural effusion or consolidation. No pneumoperitoneum. Possible mildly dilated small bowel loops on the lateral view, uncertain. No acute osseous abnormality identified. Chronic cervical ACDF.  IMPRESSION: 1.  No acute cardiopulmonary abnormality. 2. Query abnormal small bowel loops in the abdomen. CT Abdomen and Pelvis is pending at this time.   Electronically Signed   By: Genevie Ann M.D.   On: 05/23/2015 18:51   Ct Abdomen Pelvis W Contrast  05/28/2015  CLINICAL DATA:  81 year old female with nausea no vomiting. Recent history of small-bowel obstruction.  EXAM: CT ABDOMEN AND PELVIS WITH CONTRAST  TECHNIQUE: Multidetector CT imaging of the abdomen and pelvis was performed using the standard protocol following bolus administration of intravenous contrast.  CONTRAST:  53mL OMNIPAQUE IOHEXOL 300 MG/ML SOLN, 138mL OMNIPAQUE IOHEXOL 300 MG/ML SOLN  COMPARISON:  Radiograph dated 05/28/2015 and CT dated 05/23/2015.  FINDINGS: The visualized lung bases are clear. Calcification of the mitral annulus is partially visualized. No intra-abdominal free air. Trace perihepatic free fluid.  A noncalcified stone is noted within the gallbladder. There is no inflammatory changes of the gallbladder or pericholecystic fluid. The liver, pancreas, spleen, and right adrenal gland appear unremarkable. There is minimal nodularity of the left adrenal gland. Grossly stable left renal upper pole cyst. Subcentimeter left renal parenchymal hypodense lesion is too small to characterize. There is moderate stable left renal atrophy. There is no hydronephrosis on the left. An extrarenal pelvis is seen on the right. There is mild right hydronephrosis, increased from prior  study. The right ureter demonstrates normal caliber. No stone identified. A right UPJ stricture or urothelial lesion is not excluded. The urinary bladder is unremarkable. The uterus is grossly unremarkable. A 2.8 x 3.1 cm fluid density structure is again noted within the right hemipelvis similar to prior study  Evaluation of the pelvis is limited due to streak artifact caused by metallic right hip arthroplasty. There is extensive sigmoid diverticulosis without active inflammation. Oral contrast from prior study as traverses into the colon and the seen within the rectosigmoid. The multiple mildly dilated fluid-filled loops of bowel noted in the mid abdomen grossly similar in caliber since the prior study. The distal small bowel and terminal ileum demonstrated normal caliber. There is mild thickening of a short segment of small bowel in the left anterior abdomen (series 2, image 56) concerning for focal enteritis. The appendix is unremarkable.  There is aortoiliac atherosclerotic disease. There is no adenopathy. There is extensive degenerative changes of the spine. No acute fracture. Right hip arthroplasty.  IMPRESSION: Segmental thickening of the small bowel in the left hemiabdomen most compatible with enteritis. Dilated loops of small bowel proximal to this area likely represent reactive ileus. Small-bowel obstruction is less likely. Follow-up recommended.  Mild right hydronephrosis new from prior study.   Electronically Signed   By: Anner Crete M.D.   On: 05/28/2015 03:04   Ct Abdomen Pelvis W Contrast  05/23/2015   CLINICAL DATA:  81 year old female with vomiting.  EXAM: CT ABDOMEN AND PELVIS WITH CONTRAST  TECHNIQUE: Multidetector CT imaging of the abdomen and pelvis was performed using the standard protocol following bolus administration of intravenous contrast.  CONTRAST:  68mL OMNIPAQUE IOHEXOL 300 MG/ML SOLN, 173mL OMNIPAQUE IOHEXOL 300 MG/ML SOLN  COMPARISON:  Pelvic MRI dated 09/26/2013 and abdominal  CT dated 09/03/2013.  FINDINGS: Stable minimal right lung base linear atelectasis/ scarring. The visualized lung bases are otherwise clear. There is no intra-abdominal free air or free fluid.  The liver appears unremarkable. There is a stone at the neck of the gallbladder. There is no pericholecystic fluid or inflammatory changes of the gallbladder. The pancreas, spleen, and right adrenal gland appear unremarkable. A 1 cm indeterminate left adrenal nodule, possibly an adenoma. There is moderate bilateral renal atrophy, left greater right similar to the prior study. There is a stable 3.1 cm left renal upper pole cyst. Multiple bilateral renal subcentimeter hypodense lesions are too small to characterize. There is stable  fullness of the right renal pelvis likely related to an extrarenal pelvis. There is no hydronephrosis on either side. The visualized ureters and urinary bladder appear unremarkable.  Evaluation of the pelvis is limited due to streak artifact caused by metallic right hip arthroplasty. There is limited visualization and evaluation of the uterus. A grossly stable 2.7 x 3.2 cm fluid attenuating structure seen in the right posterior hemipelvis (series 2, image 60) corresponds to the cystic lesion seen on the prior CT and MR.  There is mild dilatation of loops of proximal and mid small bowel measuring up to 3 cm in diameter. The distal small bowel and terminal ileum demonstrate a normal caliber. A transition zone is noted in the right anterior hemipelvis. There is extensive diverticulosis of the sigmoid colon without active inflammation. There are scattered diverticula along the transverse colon as well. The appendix is unremarkable.  Moderate aortoiliac atherosclerotic disease. The origins of the celiac axis, SMA, IMA as well as the origins of the renal arteries are patent. No portal venous gas identified. There is no lymphadenopathy. There is degenerative changes of the spine. A metallic right hip  arthroplasty is noted. No acute fracture.  IMPRESSION: Early versus low grade small-bowel obstruction with transition zone in the right anterior hemipelvis. Clinical correlation and follow-up recommended.  Extensive colonic diverticulosis without active inflammation.  Stable right adnexal cystic lesion.   Electronically Signed   By: Anner Crete M.D.   On: 05/23/2015 19:38   Dg Abd Acute W/chest  05/28/2015   CLINICAL DATA:  81 year old female with nausea and vomiting  EXAM: DG ABDOMEN ACUTE W/ 1V CHEST  COMPARISON:  CT dated 05/23/2015  FINDINGS: Mildly dilated air-filled loop of bowel noted in the lower abdomen which may represent an ileus versus less likely a partial/ early small bowel obstruction. Clinical correlation and follow-up recommended. Air and stool is however seen within the colon. There is no free intra-abdominal air. No radiopaque calculi identified. There is degenerative changes of the spine. Right hip arthroplasty.  There are emphysematous changes of the lungs with no focal consolidation, pleural effusion, or pneumothorax. Top-normal cardiac size. Partially visualized cervical spine fixation plate and screws.  IMPRESSION: Segmental ileus versus less likely an early/ low grade small bowel obstruction. Clinical correlation and follow-up recommended.   Electronically Signed   By: Anner Crete M.D.   On: 05/28/2015 01:41

## 2015-06-16 NOTE — Patient Instructions (Signed)
1. Continue omeprazole daily before breakfast for heartburn. 2. I will discuss CT findings with Dr. Gala Romney. I don't anticipate any further work up to be needed but we will let you know. 3. Call if you have any further problems or questions.

## 2015-09-08 NOTE — Progress Notes (Signed)
Please tell patient I am sorry for the delay in getting back with her. Dr. Gala Romney reviewed her case. He does recommend CT enterography to further evaluate abnormal appearing small bowel at time of her last CT scan. If she is agreeable please schedule.

## 2015-09-15 NOTE — Progress Notes (Signed)
I spoke with patient. She is doing very well and does not want to pursue any further imaging of her small intestines at this time. She denies abdominal pain. She has a good appetite. Has gained some of her weight that she intentionally lost over the past several months.   She will call with questions or concerns.

## 2016-09-04 ENCOUNTER — Emergency Department (HOSPITAL_COMMUNITY): Payer: Medicare Other

## 2016-09-04 ENCOUNTER — Emergency Department (HOSPITAL_COMMUNITY)
Admission: EM | Admit: 2016-09-04 | Discharge: 2016-09-04 | Disposition: A | Discharge status: Home / Self Care | Payer: Medicare Other | Attending: Emergency Medicine | Admitting: Emergency Medicine

## 2016-09-04 ENCOUNTER — Encounter (HOSPITAL_COMMUNITY): Payer: Self-pay | Admitting: Emergency Medicine

## 2016-09-04 DIAGNOSIS — R1031 Right lower quadrant pain: Secondary | ICD-10-CM | POA: Diagnosis present

## 2016-09-04 DIAGNOSIS — Z87891 Personal history of nicotine dependence: Secondary | ICD-10-CM | POA: Insufficient documentation

## 2016-09-04 DIAGNOSIS — K5792 Diverticulitis of intestine, part unspecified, without perforation or abscess without bleeding: Secondary | ICD-10-CM | POA: Diagnosis not present

## 2016-09-04 DIAGNOSIS — E119 Type 2 diabetes mellitus without complications: Secondary | ICD-10-CM | POA: Diagnosis not present

## 2016-09-04 DIAGNOSIS — Z79899 Other long term (current) drug therapy: Secondary | ICD-10-CM | POA: Diagnosis not present

## 2016-09-04 DIAGNOSIS — I10 Essential (primary) hypertension: Secondary | ICD-10-CM | POA: Insufficient documentation

## 2016-09-04 DIAGNOSIS — Z791 Long term (current) use of non-steroidal anti-inflammatories (NSAID): Secondary | ICD-10-CM | POA: Insufficient documentation

## 2016-09-04 DIAGNOSIS — Z7982 Long term (current) use of aspirin: Secondary | ICD-10-CM | POA: Diagnosis not present

## 2016-09-04 LAB — URINALYSIS, ROUTINE W REFLEX MICROSCOPIC
Bilirubin Urine: NEGATIVE
Glucose, UA: NEGATIVE mg/dL
Hgb urine dipstick: NEGATIVE
Ketones, ur: NEGATIVE mg/dL
Leukocytes, UA: NEGATIVE
Nitrite: NEGATIVE
Protein, ur: NEGATIVE mg/dL
Specific Gravity, Urine: 1.008 (ref 1.005–1.030)
pH: 5 (ref 5.0–8.0)

## 2016-09-04 LAB — CBC WITH DIFFERENTIAL/PLATELET
Basophils Absolute: 0 10*3/uL (ref 0.0–0.1)
Basophils Relative: 0 %
Eosinophils Absolute: 0.1 10*3/uL (ref 0.0–0.7)
Eosinophils Relative: 2 %
HCT: 39.7 % (ref 36.0–46.0)
Hemoglobin: 13.3 g/dL (ref 12.0–15.0)
Lymphocytes Relative: 42 %
Lymphs Abs: 2.7 10*3/uL (ref 0.7–4.0)
MCH: 30.9 pg (ref 26.0–34.0)
MCHC: 33.5 g/dL (ref 30.0–36.0)
MCV: 92.3 fL (ref 78.0–100.0)
Monocytes Absolute: 0.5 10*3/uL (ref 0.1–1.0)
Monocytes Relative: 8 %
Neutro Abs: 3.1 10*3/uL (ref 1.7–7.7)
Neutrophils Relative %: 48 %
Platelets: 235 10*3/uL (ref 150–400)
RBC: 4.3 MIL/uL (ref 3.87–5.11)
RDW: 13.4 % (ref 11.5–15.5)
WBC: 6.4 10*3/uL (ref 4.0–10.5)

## 2016-09-04 LAB — COMPREHENSIVE METABOLIC PANEL
ALT: 23 U/L (ref 14–54)
AST: 25 U/L (ref 15–41)
Albumin: 3.9 g/dL (ref 3.5–5.0)
Alkaline Phosphatase: 67 U/L (ref 38–126)
Anion gap: 8 (ref 5–15)
BUN: 18 mg/dL (ref 6–20)
CO2: 27 mmol/L (ref 22–32)
Calcium: 9.2 mg/dL (ref 8.9–10.3)
Chloride: 103 mmol/L (ref 101–111)
Creatinine, Ser: 1.02 mg/dL — ABNORMAL HIGH (ref 0.44–1.00)
GFR calc Af Amer: 54 mL/min — ABNORMAL LOW (ref >60)
GFR calc non Af Amer: 47 mL/min — ABNORMAL LOW (ref >60)
Glucose, Bld: 127 mg/dL — ABNORMAL HIGH (ref 65–99)
Potassium: 4.4 mmol/L (ref 3.5–5.1)
Sodium: 138 mmol/L (ref 135–145)
Total Bilirubin: 0.8 mg/dL (ref 0.3–1.2)
Total Protein: 7.3 g/dL (ref 6.5–8.1)

## 2016-09-04 LAB — LIPASE, BLOOD: Lipase: 31 U/L (ref 11–51)

## 2016-09-04 MED ORDER — AMOXICILLIN-POT CLAVULANATE 875-125 MG PO TABS: 1.0000 | ORAL_TABLET | Freq: Two times a day (BID) | ORAL | 0 refills | Status: DC

## 2016-09-04 MED ORDER — IOPAMIDOL (ISOVUE-300) INJECTION 61%
100.0000 mL | Freq: Once | INTRAVENOUS | Status: AC | PRN
  Administered 2016-09-04: 100 mL via INTRAVENOUS

## 2016-09-04 MED ORDER — FENTANYL CITRATE (PF) 100 MCG/2ML IJ SOLN: 25.0000 ug | INTRAMUSCULAR | Status: DC | PRN

## 2016-09-04 MED ORDER — SODIUM CHLORIDE 0.9 % IV BOLUS (SEPSIS)
500.0000 mL | Freq: Once | INTRAVENOUS | Status: AC
  Administered 2016-09-04: 500 mL via INTRAVENOUS

## 2016-09-04 NOTE — ED Provider Notes (Signed)
[facility] DEPT Provider Note   CSN: [id number] Arrival date & time: 09/04/16  [id number]  By signing my name below, I, Judithe Modest, attest that this documentation has been prepared under the direction and in the presence of Elnora Morrison, MD. Electronically Signed: Judithe Modest, ER Scribe. 04/29/2016. 10:20 AM.  History   Chief Complaint Chief Complaint  Patient presents with  . Abdominal Pain   HPI HPI Comments: Denise Parrish is a 82 y.o. female who presents to the Emergency Department complaining of one week of right lower right-sided abdominal pain worse with movement or standing. She endorses associated intermittent diarrhea over the last week. She describes the pain as sudden and severe with movement. Her pain is improved with laying supine. She states her pain is similar to the pain she has had in the past when she had bone spurs in her neck. She has taken 800mg  tylenol, and 400mg  ibuprofen.    Past Medical History:  Diagnosis Date  . Anxiety   . Aortic sclerosis    Long-standing heart murmur  . Chest pain 2006   +palpitations; minimal CAD at cath in 2005; normal EF; negative stress echo in 09/2008  . Cholelithiasis    on u/s 09/2011  . Degenerative joint disease    Right THA in 1995; cervical discectomy and fusion-2001  . Diabetes mellitus, type 2 (HCC)    + neuropathy  . Emphysema   . Gastroesophageal reflux disease   . Hiatal hernia   . Hyperlipidemia   . Hypertension   . IDA (iron deficiency anemia)    labs 09/2011  . Obesity   . Osteoporosis   . Sigmoid diverticulosis   . Small bowel lesion    On Given's capsule; Dr Kathleene Hazel 07/2012, no further w/u needed    Patient Active Problem List   Diagnosis Date Noted  . Segmental ileitis of small intestine ([place]) 06/16/2015  . Diverticulosis of colon without hemorrhage 05/25/2015  . SBO (small bowel obstruction) 05/23/2015  . Nausea and vomiting in adult   . Nausea vomiting and diarrhea  09/03/2013  . Vomiting 09/03/2013  . Transaminitis 09/03/2013  . Left leg weakness 10/14/2012  . Difficulty in walking(719.7) 10/14/2012  . Small bowel lesion 07/09/2012  . Heme positive stool 03/14/2012  . GERD (gastroesophageal reflux disease) 03/14/2012  . Diarrhea 03/14/2012  . Right carotid bruit 11/01/2011  . Anemia, iron deficiency 10/28/2011  . Palpitations 10/16/2011  . Diabetes mellitus, type 2 ([place])   . Hypertension   . Hyperlipidemia   . Obesity   . Aortic sclerosis     Past Surgical History:  Procedure Laterality Date  . ANTERIOR CERVICAL DISCECTOMY  2001   C4-5, allograft, fixation  . BREAST LUMPECTOMY     benign  . CATARACT EXTRACTION W/ INTRAOCULAR LENS IMPLANT  2007   Left  . COLONOSCOPY  2006  . COLONOSCOPY  04/03/12   Dr. Ok Edwards diverticulosis, negative microscopic  colitis   . ESOPHAGOGASTRODUODENOSCOPY  04/03/12   Dr. Jennet Maduro hernia, chronic gastritis on bx  . GIVENS CAPSULE STUDY  05/09/2012   RMR: an unclear raised area of small bowel was noted, with features almost  characteristic of very small polyp. This was without villous  blunting or any evidence of active bleeding; yet, the area of  concern appeared to be erythematous. However, this could simply  be a light reflection on a normal variation of the small bowel. REFERRED TO DR. GILLIAM, APPT FOR NOVEMBER 2013.   . TONSILLECTOMY    .  TOTAL HIP ARTHROPLASTY  1995   Right    OB History    No data available       Home Medications    Prior to Admission medications   Medication Sig Start Date End Date Taking? Authorizing Provider  acetaminophen (TYLENOL) 325 MG tablet Take 325 mg by mouth every 6 (six) hours as needed for moderate pain or headache.   Yes Historical Provider, MD  aspirin EC 81 MG tablet Take 81 mg by mouth daily.   Yes Historical Provider, MD  Cholecalciferol (VITAMIN D-3 PO) Take 5,000 Units by mouth.   Yes Historical Provider, MD  Homeopathic Products (LEG CRAMP  RELIEF PO) Take 1 tablet by mouth every other day.   Yes Historical Provider, MD  ibuprofen (ADVIL,MOTRIN) 200 MG tablet Take 400 mg by mouth every 6 (six) hours as needed for headache or moderate pain.   Yes Historical Provider, MD  LORazepam (ATIVAN) 0.5 MG tablet Take 0.5 mg by mouth at bedtime.   Yes Historical Provider, MD  metoprolol tartrate (LOPRESSOR) 25 MG tablet Take 1 tablet by mouth 2 (two) times daily. 07/27/16  Yes Historical Provider, MD  omeprazole (PRILOSEC) 20 MG capsule Take 20 mg by mouth Daily. 08/05/12  Yes Historical Provider, MD  ondansetron (ZOFRAN) 4 MG tablet Take 1 tablet (4 mg total) by mouth every 6 (six) hours as needed for nausea. [id number]  Yes Delora Fuel, MD  amoxicillin-clavulanate (AUGMENTIN) 875-125 MG tablet Take 1 tablet by mouth every 12 (twelve) hours. 09/04/16   Elnora Morrison, MD    Family History Family History  Problem Relation Age of Onset  . Diabetes Mother     + brother  . Heart failure Father   . Hypertension Father     + brother  . Cancer Brother     lung, age 41  . Uterine cancer Sister   . Colon cancer Neg Hx     Social History Social History  Substance Use Topics  . Smoking status: Former Smoker    Packs/day: 1.00    Years: 30.00    Types: Cigarettes    Quit date: 10/15/1970  . Smokeless tobacco: Former Systems developer    Quit date: 09/19/1971     Comment: Quit x 50 years  . Alcohol use No     Allergies   Patient has no known allergies.   Review of Systems Review of Systems  Constitutional: Negative for chills and fever.  Gastrointestinal: Positive for abdominal pain and diarrhea.  A complete 10 system review of systems was obtained and all systems are negative except as noted in the HPI and PMH.   Physical Exam Updated Vital Signs BP 154/62   Pulse (!) 53   Temp 97.7 F (36.5 C) (Oral)   Resp 14   Ht 5\' 6"  (1.676 m)   Wt 160 lb (72.6 kg)   SpO2 97%   BMI 25.82 kg/m   Physical Exam  Constitutional: She is oriented to  person, place, and time. She appears well-developed and well-nourished. No distress.  HENT:  Head: Normocephalic and atraumatic.  Eyes: Pupils are equal, round, and reactive to light.  Neck: Neck supple.  Cardiovascular: Normal rate, regular rhythm and normal heart sounds.   No swelling in legs.   Pulmonary/Chest: Effort normal. No respiratory distress.  Anterior lung fields are clear.  Abdominal:  Very mild tenderness in the lateral RLQ. No paranitus. No pain to palpation of the flanks.  Musculoskeletal: Normal range of motion.  No pain  with ROM of the hip on the right. Mild TTP in the upper medial gluteus region, no midline TTP.   Neurological: She is alert and oriented to person, place, and time. Coordination normal.  Skin: Skin is warm and dry. She is not diaphoretic.  Psychiatric: She has a normal mood and affect. Her behavior is normal.  Nursing note and vitals reviewed.    ED Treatments / Results  Labs (all labs ordered are listed, but only abnormal results are displayed) Labs Reviewed  COMPREHENSIVE METABOLIC PANEL - Abnormal; Notable for the following:       Result Value   Glucose, Bld 127 (*)    Creatinine, Ser 1.02 (*)    GFR calc non Af Amer 47 (*)    GFR calc Af Amer 54 (*)    All other components within normal limits  URINALYSIS, ROUTINE W REFLEX MICROSCOPIC - Abnormal; Notable for the following:    Color, Urine STRAW (*)    All other components within normal limits  CBC WITH DIFFERENTIAL/PLATELET  LIPASE, BLOOD    EKG  EKG Interpretation None       Radiology No results found.  Procedures Procedures (including critical care time)  Medications Ordered in ED Medications  sodium chloride 0.9 % bolus 500 mL (0 mLs Intravenous Stopped 09/04/16 1151)  iopamidol (ISOVUE-300) 61 % injection 100 mL (100 mLs Intravenous Contrast Given 09/04/16 1127)     Initial Impression / Assessment and Plan / ED Course  I have reviewed the triage vital signs and the  nursing notes.  Pertinent labs & imaging results that were available during my care of the patient were reviewed by me and considered in my medical decision making (see chart for details).  Clinical Course    Clinical concern for mild colitis.  Labs, CT.  Well appearing in ED, no concern for sepsis. Pain controlled, tolerating po.   Results and differential diagnosis were discussed with the patient/parent/guardian. Xrays were independently reviewed by myself.  Close follow up outpatient was discussed, comfortable with the plan.   Medications  sodium chloride 0.9 % bolus 500 mL (0 mLs Intravenous Stopped 09/04/16 1151)  iopamidol (ISOVUE-300) 61 % injection 100 mL (100 mLs Intravenous Contrast Given 09/04/16 1127)    Vitals:   09/04/16 0944 09/04/16 1100 09/04/16 1230 09/04/16 1300  BP:  157/61 153/55 154/62  Pulse:  (!) 53 (!) 51 (!) 53  Resp:  12 14 14   Temp:      TempSrc:      SpO2:  99% 96% 97%  Weight: 160 lb (72.6 kg)     Height: 5\' 6"  (1.676 m)       Final diagnoses:  Acute right lower quadrant pain  Acute diverticulitis     Final Clinical Impressions(s) / ED Diagnoses   Final diagnoses:  Acute right lower quadrant pain  Acute diverticulitis    New Prescriptions Discharge Medication List as of 09/04/2016 12:58 PM           Elnora Morrison, MD 09/09/16 1515

## 2016-09-04 NOTE — ED Triage Notes (Signed)
Intermittent RLQ pain x 1 week,  Diarrhea at times.

## 2016-09-04 NOTE — Discharge Instructions (Signed)
Take antibiotics for one week. Soft diet as tolerated.    If you were given medicines take as directed.  If you are on coumadin or contraceptives realize their levels and effectiveness is altered by many different medicines.  If you have any reaction (rash, tongues swelling, other) to the medicines stop taking and see a physician.    If your blood pressure was elevated in the ER make sure you follow up for management with a primary doctor or return for chest pain, shortness of breath or stroke symptoms.  Please follow up as directed and return to the ER or see a physician for new or worsening symptoms.  Thank you. Vitals:   09/04/16 0942 09/04/16 0944 09/04/16 1100 09/04/16 1230  BP: 174/74  157/61 153/55  Pulse: 64  (!) 53 (!) 51  Resp: 18  12 14   Temp: 97.7 F (36.5 C)     TempSrc: Oral     SpO2: 97%  99% 96%  Weight:  160 lb (72.6 kg)    Height:  5\' 6"  (1.676 m)

## 2017-12-08 ENCOUNTER — Inpatient Hospital Stay (HOSPITAL_COMMUNITY)
Admission: EM | Admit: 2017-12-08 | Discharge: 2017-12-11 | DRG: 871 | Disposition: A | Discharge status: Skilled Nursing Facility | Payer: Medicare Other | Attending: Internal Medicine | Admitting: Internal Medicine

## 2017-12-08 ENCOUNTER — Encounter (HOSPITAL_COMMUNITY): Payer: Self-pay | Admitting: Emergency Medicine

## 2017-12-08 ENCOUNTER — Other Ambulatory Visit: Payer: Self-pay

## 2017-12-08 ENCOUNTER — Emergency Department (HOSPITAL_COMMUNITY): Payer: Medicare Other

## 2017-12-08 DIAGNOSIS — F419 Anxiety disorder, unspecified: Secondary | ICD-10-CM | POA: Diagnosis present

## 2017-12-08 DIAGNOSIS — J181 Lobar pneumonia, unspecified organism: Secondary | ICD-10-CM | POA: Diagnosis present

## 2017-12-08 DIAGNOSIS — Z7984 Long term (current) use of oral hypoglycemic drugs: Secondary | ICD-10-CM | POA: Diagnosis not present

## 2017-12-08 DIAGNOSIS — I700 Atherosclerosis of aorta: Secondary | ICD-10-CM | POA: Diagnosis present

## 2017-12-08 DIAGNOSIS — K8020 Calculus of gallbladder without cholecystitis without obstruction: Secondary | ICD-10-CM | POA: Diagnosis not present

## 2017-12-08 DIAGNOSIS — Z634 Disappearance and death of family member: Secondary | ICD-10-CM | POA: Diagnosis not present

## 2017-12-08 DIAGNOSIS — Z952 Presence of prosthetic heart valve: Secondary | ICD-10-CM | POA: Diagnosis not present

## 2017-12-08 DIAGNOSIS — R011 Cardiac murmur, unspecified: Secondary | ICD-10-CM | POA: Diagnosis present

## 2017-12-08 DIAGNOSIS — J439 Emphysema, unspecified: Secondary | ICD-10-CM | POA: Diagnosis present

## 2017-12-08 DIAGNOSIS — A419 Sepsis, unspecified organism: Principal | ICD-10-CM | POA: Diagnosis present

## 2017-12-08 DIAGNOSIS — E860 Dehydration: Secondary | ICD-10-CM | POA: Diagnosis not present

## 2017-12-08 DIAGNOSIS — I10 Essential (primary) hypertension: Secondary | ICD-10-CM | POA: Diagnosis present

## 2017-12-08 DIAGNOSIS — M8580 Other specified disorders of bone density and structure, unspecified site: Secondary | ICD-10-CM | POA: Diagnosis not present

## 2017-12-08 DIAGNOSIS — E785 Hyperlipidemia, unspecified: Secondary | ICD-10-CM | POA: Diagnosis not present

## 2017-12-08 DIAGNOSIS — E872 Acidosis, unspecified: Secondary | ICD-10-CM | POA: Diagnosis present

## 2017-12-08 DIAGNOSIS — K219 Gastro-esophageal reflux disease without esophagitis: Secondary | ICD-10-CM | POA: Diagnosis present

## 2017-12-08 DIAGNOSIS — Z6830 Body mass index (BMI) 30.0-30.9, adult: Secondary | ICD-10-CM | POA: Diagnosis not present

## 2017-12-08 DIAGNOSIS — R140 Abdominal distension (gaseous): Secondary | ICD-10-CM | POA: Diagnosis not present

## 2017-12-08 DIAGNOSIS — Z833 Family history of diabetes mellitus: Secondary | ICD-10-CM | POA: Diagnosis not present

## 2017-12-08 DIAGNOSIS — Z7982 Long term (current) use of aspirin: Secondary | ICD-10-CM

## 2017-12-08 DIAGNOSIS — Z9089 Acquired absence of other organs: Secondary | ICD-10-CM | POA: Diagnosis not present

## 2017-12-08 DIAGNOSIS — Z8719 Personal history of other diseases of the digestive system: Secondary | ICD-10-CM | POA: Diagnosis not present

## 2017-12-08 DIAGNOSIS — Z8249 Family history of ischemic heart disease and other diseases of the circulatory system: Secondary | ICD-10-CM

## 2017-12-08 DIAGNOSIS — M810 Age-related osteoporosis without current pathological fracture: Secondary | ICD-10-CM | POA: Diagnosis present

## 2017-12-08 DIAGNOSIS — Z96641 Presence of right artificial hip joint: Secondary | ICD-10-CM | POA: Diagnosis present

## 2017-12-08 DIAGNOSIS — N2889 Other specified disorders of kidney and ureter: Secondary | ICD-10-CM | POA: Diagnosis not present

## 2017-12-08 DIAGNOSIS — Z981 Arthrodesis status: Secondary | ICD-10-CM

## 2017-12-08 DIAGNOSIS — Z801 Family history of malignant neoplasm of trachea, bronchus and lung: Secondary | ICD-10-CM

## 2017-12-08 DIAGNOSIS — E1140 Type 2 diabetes mellitus with diabetic neuropathy, unspecified: Secondary | ICD-10-CM | POA: Diagnosis present

## 2017-12-08 DIAGNOSIS — Z87891 Personal history of nicotine dependence: Secondary | ICD-10-CM | POA: Diagnosis not present

## 2017-12-08 DIAGNOSIS — E119 Type 2 diabetes mellitus without complications: Secondary | ICD-10-CM | POA: Diagnosis not present

## 2017-12-08 DIAGNOSIS — K560 Paralytic ileus: Secondary | ICD-10-CM | POA: Diagnosis not present

## 2017-12-08 DIAGNOSIS — J9811 Atelectasis: Secondary | ICD-10-CM | POA: Diagnosis not present

## 2017-12-08 DIAGNOSIS — K567 Ileus, unspecified: Secondary | ICD-10-CM | POA: Diagnosis present

## 2017-12-08 DIAGNOSIS — J189 Pneumonia, unspecified organism: Secondary | ICD-10-CM | POA: Diagnosis not present

## 2017-12-08 DIAGNOSIS — K529 Noninfective gastroenteritis and colitis, unspecified: Secondary | ICD-10-CM | POA: Diagnosis not present

## 2017-12-08 DIAGNOSIS — R1011 Right upper quadrant pain: Secondary | ICD-10-CM | POA: Diagnosis present

## 2017-12-08 DIAGNOSIS — Z961 Presence of intraocular lens: Secondary | ICD-10-CM | POA: Diagnosis present

## 2017-12-08 DIAGNOSIS — M62838 Other muscle spasm: Secondary | ICD-10-CM | POA: Diagnosis present

## 2017-12-08 DIAGNOSIS — R509 Fever, unspecified: Secondary | ICD-10-CM | POA: Diagnosis present

## 2017-12-08 DIAGNOSIS — E669 Obesity, unspecified: Secondary | ICD-10-CM | POA: Diagnosis present

## 2017-12-08 DIAGNOSIS — Z9842 Cataract extraction status, left eye: Secondary | ICD-10-CM

## 2017-12-08 DIAGNOSIS — J90 Pleural effusion, not elsewhere classified: Secondary | ICD-10-CM | POA: Diagnosis not present

## 2017-12-08 DIAGNOSIS — Z79899 Other long term (current) drug therapy: Secondary | ICD-10-CM

## 2017-12-08 DIAGNOSIS — K5730 Diverticulosis of large intestine without perforation or abscess without bleeding: Secondary | ICD-10-CM | POA: Diagnosis not present

## 2017-12-08 DIAGNOSIS — Z8049 Family history of malignant neoplasm of other genital organs: Secondary | ICD-10-CM

## 2017-12-08 LAB — MAGNESIUM: Magnesium: 1.9 mg/dL (ref 1.7–2.4)

## 2017-12-08 LAB — COMPREHENSIVE METABOLIC PANEL
ALT: 22 U/L (ref 14–54)
AST: 30 U/L (ref 15–41)
Albumin: 3.8 g/dL (ref 3.5–5.0)
Alkaline Phosphatase: 77 U/L (ref 38–126)
Anion gap: 14 (ref 5–15)
BUN: 16 mg/dL (ref 6–20)
CO2: 25 mmol/L (ref 22–32)
Calcium: 8.9 mg/dL (ref 8.9–10.3)
Chloride: 101 mmol/L (ref 101–111)
Creatinine, Ser: 1.01 mg/dL — ABNORMAL HIGH (ref 0.44–1.00)
GFR calc Af Amer: 54 mL/min — ABNORMAL LOW (ref >60)
GFR calc non Af Amer: 46 mL/min — ABNORMAL LOW (ref >60)
Glucose, Bld: 189 mg/dL — ABNORMAL HIGH (ref 65–99)
Potassium: 4.2 mmol/L (ref 3.5–5.1)
Sodium: 140 mmol/L (ref 135–145)
Total Bilirubin: 1.2 mg/dL (ref 0.3–1.2)
Total Protein: 7.3 g/dL (ref 6.5–8.1)

## 2017-12-08 LAB — HEMOGLOBIN A1C
Hgb A1c MFr Bld: 6 % — ABNORMAL HIGH (ref 4.8–5.6)
Mean Plasma Glucose: 125.5 mg/dL

## 2017-12-08 LAB — CBC WITH DIFFERENTIAL/PLATELET
Basophils Absolute: 0 10*3/uL (ref 0.0–0.1)
Basophils Relative: 0 %
Eosinophils Absolute: 0.1 10*3/uL (ref 0.0–0.7)
Eosinophils Relative: 0 %
HCT: 40.9 % (ref 36.0–46.0)
Hemoglobin: 13.1 g/dL (ref 12.0–15.0)
Lymphocytes Relative: 9 %
Lymphs Abs: 1.2 10*3/uL (ref 0.7–4.0)
MCH: 29.8 pg (ref 26.0–34.0)
MCHC: 32 g/dL (ref 30.0–36.0)
MCV: 93 fL (ref 78.0–100.0)
Monocytes Absolute: 0.6 10*3/uL (ref 0.1–1.0)
Monocytes Relative: 5 %
Neutro Abs: 11.1 10*3/uL — ABNORMAL HIGH (ref 1.7–7.7)
Neutrophils Relative %: 86 %
Platelets: 270 10*3/uL (ref 150–400)
RBC: 4.4 MIL/uL (ref 3.87–5.11)
RDW: 13.7 % (ref 11.5–15.5)
WBC: 13 10*3/uL — ABNORMAL HIGH (ref 4.0–10.5)

## 2017-12-08 LAB — URINALYSIS, ROUTINE W REFLEX MICROSCOPIC
Bilirubin Urine: NEGATIVE
Glucose, UA: 50 mg/dL — AB
Hgb urine dipstick: NEGATIVE
Ketones, ur: 5 mg/dL — AB
Leukocytes, UA: NEGATIVE
Nitrite: NEGATIVE
Protein, ur: NEGATIVE mg/dL
Specific Gravity, Urine: 1.01 (ref 1.005–1.030)
pH: 7 (ref 5.0–8.0)

## 2017-12-08 LAB — INFLUENZA PANEL BY PCR (TYPE A & B)
Influenza A By PCR: NEGATIVE
Influenza B By PCR: NEGATIVE

## 2017-12-08 LAB — I-STAT CG4 LACTIC ACID, ED
Lactic Acid, Venous: 2.62 mmol/L (ref 0.5–1.9)
Lactic Acid, Venous: 4.03 mmol/L (ref 0.5–1.9)

## 2017-12-08 LAB — GLUCOSE, CAPILLARY: Glucose-Capillary: 107 mg/dL — ABNORMAL HIGH (ref 65–99)

## 2017-12-08 LAB — TROPONIN I: Troponin I: <0.03 ng/mL (ref <0.03)

## 2017-12-08 LAB — STREP PNEUMONIAE URINARY ANTIGEN: Strep Pneumo Urinary Antigen: NEGATIVE

## 2017-12-08 LAB — PHOSPHORUS: Phosphorus: 2.5 mg/dL (ref 2.5–4.6)

## 2017-12-08 MED ORDER — ALPRAZOLAM 0.25 MG PO TABS
0.2500 mg | ORAL_TABLET | Freq: Three times a day (TID) | ORAL | Status: DC
  Administered 2017-12-08 – 2017-12-09 (×2): 0.25 mg via ORAL
  Filled 2017-12-08 (×2): qty 1

## 2017-12-08 MED ORDER — METHOCARBAMOL 500 MG PO TABS
500.0000 mg | ORAL_TABLET | Freq: Three times a day (TID) | ORAL | Status: DC | PRN
  Administered 2017-12-10: 500 mg via ORAL
  Filled 2017-12-08: qty 1

## 2017-12-08 MED ORDER — ACETAMINOPHEN 650 MG RE SUPP
650.0000 mg | Freq: Four times a day (QID) | RECTAL | Status: DC | PRN
  Administered 2017-12-08 – 2017-12-09 (×2): 650 mg via RECTAL
  Filled 2017-12-08 (×2): qty 1

## 2017-12-08 MED ORDER — MAGNESIUM OXIDE 400 (241.3 MG) MG PO TABS
800.0000 mg | ORAL_TABLET | Freq: Every day | ORAL | Status: DC
  Administered 2017-12-08 – 2017-12-11 (×4): 800 mg via ORAL
  Filled 2017-12-08 (×7): qty 2

## 2017-12-08 MED ORDER — SODIUM CHLORIDE 0.9 % IV BOLUS (SEPSIS)
1000.0000 mL | Freq: Once | INTRAVENOUS | Status: AC
  Administered 2017-12-08: 1000 mL via INTRAVENOUS

## 2017-12-08 MED ORDER — TRAZODONE HCL 50 MG PO TABS
50.0000 mg | ORAL_TABLET | Freq: Once | ORAL | Status: AC
  Administered 2017-12-08: 50 mg via ORAL
  Filled 2017-12-08: qty 1

## 2017-12-08 MED ORDER — INSULIN ASPART 100 UNIT/ML ~~LOC~~ SOLN
0.0000 [IU] | Freq: Three times a day (TID) | SUBCUTANEOUS | Status: DC
  Administered 2017-12-09 – 2017-12-11 (×4): 1 [IU] via SUBCUTANEOUS

## 2017-12-08 MED ORDER — ACETAMINOPHEN 325 MG PO TABS
650.0000 mg | ORAL_TABLET | Freq: Four times a day (QID) | ORAL | Status: DC | PRN
  Filled 2017-12-08: qty 2

## 2017-12-08 MED ORDER — ASPIRIN EC 81 MG PO TBEC
81.0000 mg | DELAYED_RELEASE_TABLET | Freq: Every day | ORAL | Status: DC
  Administered 2017-12-09 – 2017-12-11 (×3): 81 mg via ORAL
  Filled 2017-12-08 (×3): qty 1

## 2017-12-08 MED ORDER — ENOXAPARIN SODIUM 40 MG/0.4ML ~~LOC~~ SOLN
40.0000 mg | SUBCUTANEOUS | Status: DC
  Administered 2017-12-08 – 2017-12-11 (×4): 40 mg via SUBCUTANEOUS
  Filled 2017-12-08 (×4): qty 0.4

## 2017-12-08 MED ORDER — IPRATROPIUM-ALBUTEROL 0.5-2.5 (3) MG/3ML IN SOLN
3.0000 mL | Freq: Four times a day (QID) | RESPIRATORY_TRACT | Status: DC
  Administered 2017-12-08 (×2): 3 mL via RESPIRATORY_TRACT
  Filled 2017-12-08 (×2): qty 3

## 2017-12-08 MED ORDER — ACETAMINOPHEN 325 MG PO TABS
650.0000 mg | ORAL_TABLET | Freq: Once | ORAL | Status: AC
  Administered 2017-12-08: 650 mg via ORAL
  Filled 2017-12-08: qty 2

## 2017-12-08 MED ORDER — LORAZEPAM 2 MG/ML IJ SOLN
1.0000 mg | Freq: Once | INTRAMUSCULAR | Status: AC
  Administered 2017-12-08: 1 mg via INTRAVENOUS
  Filled 2017-12-08: qty 1

## 2017-12-08 MED ORDER — SODIUM CHLORIDE 0.9 % IV SOLN
1.0000 g | Freq: Once | INTRAVENOUS | Status: AC
  Administered 2017-12-08: 1 g via INTRAVENOUS
  Filled 2017-12-08: qty 10

## 2017-12-08 MED ORDER — SODIUM CHLORIDE 0.9 % IV SOLN
1.0000 g | INTRAVENOUS | Status: DC
  Administered 2017-12-09 – 2017-12-11 (×3): 1 g via INTRAVENOUS
  Filled 2017-12-08 (×2): qty 1
  Filled 2017-12-08: qty 10
  Filled 2017-12-08: qty 1

## 2017-12-08 MED ORDER — PANTOPRAZOLE SODIUM 40 MG PO TBEC
40.0000 mg | DELAYED_RELEASE_TABLET | Freq: Every day | ORAL | Status: DC
  Administered 2017-12-09 – 2017-12-11 (×3): 40 mg via ORAL
  Filled 2017-12-08 (×3): qty 1

## 2017-12-08 MED ORDER — INSULIN ASPART 100 UNIT/ML ~~LOC~~ SOLN: 0.0000 [IU] | Freq: Every day | SUBCUTANEOUS | Status: DC

## 2017-12-08 MED ORDER — ONDANSETRON HCL 4 MG/2ML IJ SOLN
4.0000 mg | Freq: Four times a day (QID) | INTRAMUSCULAR | Status: DC | PRN
  Administered 2017-12-08 – 2017-12-10 (×5): 4 mg via INTRAVENOUS
  Filled 2017-12-08 (×5): qty 2

## 2017-12-08 MED ORDER — SODIUM CHLORIDE 0.9 % IV BOLUS (SEPSIS)
500.0000 mL | Freq: Once | INTRAVENOUS | Status: AC
  Administered 2017-12-08: 500 mL via INTRAVENOUS

## 2017-12-08 MED ORDER — SODIUM CHLORIDE 0.9 % IV SOLN
500.0000 mg | INTRAVENOUS | Status: DC
  Administered 2017-12-09 – 2017-12-11 (×3): 500 mg via INTRAVENOUS
  Filled 2017-12-08 (×4): qty 500

## 2017-12-08 MED ORDER — ONDANSETRON HCL 4 MG PO TABS: 4.0000 mg | ORAL_TABLET | Freq: Four times a day (QID) | ORAL | Status: DC | PRN

## 2017-12-08 MED ORDER — IBUPROFEN 400 MG PO TABS: 400.0000 mg | ORAL_TABLET | Freq: Four times a day (QID) | ORAL | Status: DC | PRN

## 2017-12-08 MED ORDER — SODIUM CHLORIDE 0.9 % IV SOLN
500.0000 mg | Freq: Once | INTRAVENOUS | Status: AC
  Administered 2017-12-08: 500 mg via INTRAVENOUS
  Filled 2017-12-08: qty 500

## 2017-12-08 MED ORDER — POTASSIUM CHLORIDE CRYS ER 20 MEQ PO TBCR
20.0000 meq | EXTENDED_RELEASE_TABLET | Freq: Every day | ORAL | Status: DC
Start: 2017-12-08 — End: 2017-12-11
  Administered 2017-12-09 – 2017-12-11 (×3): 20 meq via ORAL
  Filled 2017-12-08 (×3): qty 1

## 2017-12-08 MED ORDER — SODIUM CHLORIDE 0.9 % IV SOLN
INTRAVENOUS | Status: DC
  Administered 2017-12-08 – 2017-12-09 (×2): via INTRAVENOUS

## 2017-12-08 NOTE — ED Notes (Signed)
Pt given meal tray.

## 2017-12-08 NOTE — H&P (Signed)
History and Physical    Denise Parrish QZR:[id number] DOB: 1923/06/09 DOA: 12/08/2017  PCP: Lemmie Evens, MD   I have briefly reviewed patients previous medical reports in [facility].  Patient coming from: home  Chief Complaint: Fever, nonproductive cough not feeling well.  HPI: Denise Parrish is a 84 year old fully functional, still driving and living by herself; who has a past medical history significant for anxiety, hypertension, hyperlipidemia, gastroesophageal reflux disease and type 2 diabetes; who presented to the emergency department secondary to fever, no feeling well and nonproductive cough.  Patient with a recent tragic death in the family, which has made her to experience increasing her anxiety and having difficulty sleeping; reported no feeling well at all and with fever the day prior to being admitted.  Patient also have nonproductive cough, start decreasing her p.o. intake and just not feeling well.  There has not been any sick contacts, patient denies chest pain, dysuria, hematuria, abdominal pain, hematemesis, melena, hematochezia or any other acute complaints. Of note, in the ED influenza by PCR was done and was negative. Patient has been taking xanax for ongoing anxiety/emotion control and to help her dealing with recent loss.  ED Course: Patient met sepsis criteria with elevated temperature, elevated heart rate, tachypnea, elevated WBCs and chest x-ray with findings suggesting pneumonia as a source of infection.  Patient also with elevated lactic acid.  IV fluid boluses following sepsis protocol given while in the ED, cultures taken and antibiotics initiated.  [facility] contacted to admit patient for further evaluation and treatment.  Review of Systems:  All other systems reviewed and apart from HPI, are negative.  Past Medical History:  Diagnosis Date  . Anxiety   . Aortic sclerosis    Long-standing heart murmur  . Chest pain 2006   +palpitations; minimal CAD  at cath in 2005; normal EF; negative stress echo in 09/2008  . Cholelithiasis    on u/s 09/2011  . Degenerative joint disease    Right THA in 1995; cervical discectomy and fusion-2001  . Diabetes mellitus, type 2 (HCC)    + neuropathy  . Emphysema   . Gastroesophageal reflux disease   . Hiatal hernia   . Hyperlipidemia   . Hypertension   . IDA (iron deficiency anemia)    labs 09/2011  . Obesity   . Osteoporosis   . Sigmoid diverticulosis   . Small bowel lesion    On Given's capsule; Dr Kathleene Hazel 07/2012, no further w/u needed    Past Surgical History:  Procedure Laterality Date  . ANTERIOR CERVICAL DISCECTOMY  2001   C4-5, allograft, fixation  . BREAST LUMPECTOMY     benign  . CATARACT EXTRACTION W/ INTRAOCULAR LENS IMPLANT  2007   Left  . COLONOSCOPY  2006  . COLONOSCOPY  04/03/12   Dr. Ok Edwards diverticulosis, negative microscopic  colitis   . ESOPHAGOGASTRODUODENOSCOPY  04/03/12   Dr. Jennet Maduro hernia, chronic gastritis on bx  . GIVENS CAPSULE STUDY  05/09/2012   RMR: an unclear raised area of small bowel was noted, with features almost  characteristic of very small polyp. This was without villous  blunting or any evidence of active bleeding; yet, the area of  concern appeared to be erythematous. However, this could simply  be a light reflection on a normal variation of the small bowel. REFERRED TO DR. GILLIAM, APPT FOR NOVEMBER 2013.   . TONSILLECTOMY    . TOTAL HIP ARTHROPLASTY  1995   Right  Social History  reports that she quit smoking about 47 years ago. Her smoking use included cigarettes. She has a 30.00 pack-year smoking history. She quit smokeless tobacco use about 46 years ago. She reports that she does not drink alcohol or use drugs.  No Known Allergies  Family History  Problem Relation Age of Onset  . Diabetes Mother        + brother  . Heart failure Father   . Hypertension Father        + brother  . Cancer Brother        lung, age 31  .  Uterine cancer Sister   . Colon cancer Neg Hx     Prior to Admission medications   Medication Sig Start Date End Date Taking? Authorizing Provider  acetaminophen (TYLENOL) 325 MG tablet Take 325 mg by mouth every 6 (six) hours as needed for moderate pain or headache.   Yes [provider]  ALPRAZolam Duanne Moron) 0.5 MG tablet Take 0.5 mg by mouth 3 (three) times daily.  12/07/17  Yes [provider]  amoxicillin-clavulanate (AUGMENTIN) 875-125 MG tablet Take 1 tablet by mouth every 12 (twelve) hours. 09/04/16  Yes Elnora Morrison, MD  aspirin EC 81 MG tablet Take by mouth.   Yes [provider]  Cholecalciferol (VITAMIN D-3 PO) Take 5,000 Units by mouth.   Yes [provider]  glimepiride (AMARYL) 1 MG tablet Take 1 mg by mouth daily.  09/17/17  Yes [provider]  Homeopathic Products (LEG CRAMP RELIEF PO) Take 1 tablet by mouth every other day.   Yes [provider]  ibuprofen (ADVIL,MOTRIN) 200 MG tablet Take 400 mg by mouth every 6 (six) hours as needed for headache or moderate pain.   Yes [provider]  loperamide (IMODIUM A-D) 2 MG tablet Take by mouth.   Yes [provider]  LORazepam (ATIVAN) 0.5 MG tablet Take 0.5 mg by mouth 2 (two) times daily.    Yes [provider]  magnesium oxide (MAG-OX) 400 MG tablet Take 800 mg by mouth daily.   Yes [provider]  metoprolol tartrate (LOPRESSOR) 25 MG tablet Take 1 tablet by mouth 2 (two) times daily. 07/27/16  Yes [provider]  omeprazole (PRILOSEC) 20 MG capsule Take 20 mg by mouth Daily. 08/05/12  Yes [provider]  potassium chloride SA (K-DUR,KLOR-CON) 20 MEQ tablet  09/28/17  Yes [provider]    Physical Exam: Vitals:   12/08/17 1443 12/08/17 1448 12/08/17 1529 12/08/17 1542  BP:  104/70 (!) 102/44 (!) 102/44  Pulse:  81 70 70  Resp:  14 20   Temp: 98.4 F (36.9 C)  98.1 F (36.7 C) 98.1 F (36.7 C)    TempSrc: Oral  Oral Oral  SpO2:  99% 98% 98%  Weight:   80.9 kg (178 lb 4.8 oz) 80.9 kg (178 lb 4 oz)  Height:   _0  (1.626 m) _1  (1.626 m)    Constitutional: Warm to touch, no chest pain, no focal neurologic deficit, no nausea vomiting.  Patient reports nonproductive cough and feeling slightly short of breath. Eyes: PERRTLA, lids and conjunctivae normal, no icterus ENMT: Mucous membranes dry on exam. Posterior pharynx clear of any exudate or lesions. No thrush.  Neck: supple, no masses, no thyromegaly, no JVD Respiratory: No wheezing, no crackles; positive rhonchi (right more than left), no using accessory muscles, patient able to speak in full sentences and with good O2 sat on room  air. Cardiovascular: S1 & S2 heard, regular rate and rhythm, no murmurs / rubs / gallops. No extremity edema. 2+ pedal pulses. No carotid bruits.  Abdomen: No distension, no tenderness, no masses palpated. No hepatosplenomegaly. Bowel sounds normal.  Musculoskeletal: no clubbing / cyanosis. No joint deformity upper and lower extremities. Good ROM, no contractures. Normal muscle tone.  Skin: no rashes, lesions, ulcers. No induration Neurologic: CN 2-12 grossly intact. Sensation intact, DTR normal. Strength 4/5 in all 4 limbs due to poor effort and no feeling good.[name]  Psychiatric: Normal judgment and insight. Alert and oriented x 3. Normal mood.    Labs on Admission: I have personally reviewed following labs and imaging studies  CBC: Recent Labs  Lab 12/08/17 0520  WBC 13.0*  NEUTROABS 11.1*  HGB 13.1  HCT 40.9  MCV 93.0  PLT 433   Basic Metabolic Panel: Recent Labs  Lab 12/08/17 0520  NA 140  K 4.2  CL 101  CO2 25  GLUCOSE 189*  BUN 16  CREATININE 1.01*  CALCIUM 8.9   Liver Function Tests: Recent Labs  Lab 12/08/17 0520  AST 30  ALT 22  ALKPHOS 77  BILITOT 1.2  PROT 7.3  ALBUMIN 3.8   Cardiac Enzymes: Recent Labs  Lab 12/08/17 0520  TROPONINI <0.03   Urine analysis:     Component Value Date/Time   COLORURINE STRAW (A) 12/08/2017 0617   APPEARANCEUR CLEAR 12/08/2017 0617   LABSPEC 1.010 12/08/2017 0617   PHURINE 7.0 12/08/2017 0617   GLUCOSEU 50 (A) 12/08/2017 0617   HGBUR NEGATIVE 12/08/2017 0617   BILIRUBINUR NEGATIVE 12/08/2017 0617   KETONESUR 5 (A) 12/08/2017 0617   PROTEINUR NEGATIVE 12/08/2017 0617   UROBILINOGEN 1.0 05/23/2015 1956   NITRITE NEGATIVE 12/08/2017 0617   LEUKOCYTESUR NEGATIVE 12/08/2017 0617    Radiological Exams on Admission: Dg Chest 2 View  Result Date: 12/08/2017 CLINICAL DATA:  Fever. EXAM: CHEST - 2 VIEW COMPARISON:  05/28/2015 FINDINGS: Heart size upper normal, likely normal for technique. Chronic hyperinflation and interstitial coarsening. Minimal patchy right suprahilar opacity. No pulmonary edema. No pleural effusion or pneumothorax. No acute osseous abnormalities. IMPRESSION: Minimal patchy right suprahilar opacities suspicious for pneumonia/pneumonitis in the setting of fever. Electronically Signed   By: Jeb Levering M.D.   On: 12/08/2017 06:23    EKG:  No acute ischemic changes appreciated, sinus rhythm, normal axis.  Assessment/Plan 1-sepsis ([place]): Due to right upper lobe community-acquired pneumonia. -Patient received fluid resuscitation in the ED according to sepsis protocol. -Will follow lactic acid trend and continue fluid supplementation -started on rocephin and zithromax -will use Duoneb, pulmicort and flutter valve -Follow cultures as per pneumonia protocol (blood cultures, strep antigen and Legionella antigen in urine, sputum cultures) -Provide supportive care and follow clinical response.  2-Diabetes mellitus, type 2 ([place]) -will A1C -start SSI -stop oral hypoglycemic regimen while inpatient   3-Hypertension -BP soft currently -will hold antihypertensive regimen initially and follow VS -continue IVF's -heart healthy diet ordered   4-GERD (gastroesophageal reflux disease) -Continue  PPI  5-lactic acidosis -In the setting of dehydration and acute sepsis from pneumonia -Continue IV fluids -Follow lactic acid trend  6-Anxiety -will continue xanax  7-Dehydration: In the setting of increased insensible losses due to fever -Continue IV fluids and follow volume status.  8-muscle spasm/leg cramps -Chronic and idiopathic -Will check phosphorus and magnesium in the setting of dehydration -Use as needed Robaxin   Time: 70 minutes   DVT prophylaxis:  lovenox Code Status: full Family Communication:  Daughter at bedside Disposition Plan: Anticipate discharge back home once sepsis resolved and patient condition stable. Consults called: None Admission status: Inpatient, length of stay more than 2 midnights, MedSurg   Barton Dubois MD [facility] Pager [phone]  If 7PM-7AM, please contact night-coverage [email or web address] Password [facility]  12/08/2017, 6:28 PM

## 2017-12-08 NOTE — ED Provider Notes (Signed)
[facility] EMERGENCY DEPARTMENT Provider Note   CSN: [id number] Arrival date & time: 12/08/17  7989     History   Chief Complaint Chief Complaint  Patient presents with  . Fever    HPI Denise Parrish is a 84 y.o. female.  Patient  presents via EMS with "anxiety" and inability to sleep.  Patient's great grandson was killed in a car accident 2 nights ago and no one in the family has been sleeping well.  Patient saw PCP for the same yesterday and was started on Xanax.  She took 2 of these today without much help.  Family noticed that she was shaking all over tonight and EMS was called because of insomnia and worsening anxiety.  Patient denies any pain.  She denies any nausea or vomiting.  She denies any diarrhea.  She appears dehydrated but states she has been eating and drinking well.  Found to be febrile to 103.3 on arrival.  Did not know she had a fever at home.  The history is provided by the patient and the EMS personnel.  Fever   Pertinent negatives include no chest pain, no vomiting, no headaches and no cough.    Past Medical History:  Diagnosis Date  . Anxiety   . Aortic sclerosis    Long-standing heart murmur  . Chest pain 2006   +palpitations; minimal CAD at cath in 2005; normal EF; negative stress echo in 09/2008  . Cholelithiasis    on u/s 09/2011  . Degenerative joint disease    Right THA in 1995; cervical discectomy and fusion-2001  . Diabetes mellitus, type 2 (HCC)    + neuropathy  . Emphysema   . Gastroesophageal reflux disease   . Hiatal hernia   . Hyperlipidemia   . Hypertension   . IDA (iron deficiency anemia)    labs 09/2011  . Obesity   . Osteoporosis   . Sigmoid diverticulosis   . Small bowel lesion    On Given's capsule; Dr Kathleene Hazel 07/2012, no further w/u needed    Patient Active Problem List   Diagnosis Date Noted  . Segmental ileitis of small intestine ([place]) 06/16/2015  . Diverticulosis of colon without hemorrhage 05/25/2015  .  SBO (small bowel obstruction) ([place]) 05/23/2015  . Nausea and vomiting in adult   . Nausea vomiting and diarrhea 09/03/2013  . Vomiting 09/03/2013  . Transaminitis 09/03/2013  . Left leg weakness 10/14/2012  . Difficulty in walking(719.7) 10/14/2012  . Small bowel lesion 07/09/2012  . Heme positive stool 03/14/2012  . GERD (gastroesophageal reflux disease) 03/14/2012  . Diarrhea 03/14/2012  . Right carotid bruit 11/01/2011  . Anemia, iron deficiency 10/28/2011  . Palpitations 10/16/2011  . Diabetes mellitus, type 2 ([place])   . Hypertension   . Hyperlipidemia   . Obesity   . Aortic sclerosis     Past Surgical History:  Procedure Laterality Date  . ANTERIOR CERVICAL DISCECTOMY  2001   C4-5, allograft, fixation  . BREAST LUMPECTOMY     benign  . CATARACT EXTRACTION W/ INTRAOCULAR LENS IMPLANT  2007   Left  . COLONOSCOPY  2006  . COLONOSCOPY  04/03/12   Dr. Ok Edwards diverticulosis, negative microscopic  colitis   . ESOPHAGOGASTRODUODENOSCOPY  04/03/12   Dr. Jennet Maduro hernia, chronic gastritis on bx  . GIVENS CAPSULE STUDY  05/09/2012   RMR: an unclear raised area of small bowel was noted, with features almost  characteristic of very small polyp. This was without villous  blunting or  any evidence of active bleeding; yet, the area of  concern appeared to be erythematous. However, this could simply  be a light reflection on a normal variation of the small bowel. REFERRED TO DR. GILLIAM, APPT FOR NOVEMBER 2013.   . TONSILLECTOMY    . TOTAL HIP ARTHROPLASTY  1995   Right     OB History   None      Home Medications    Prior to Admission medications   Medication Sig Start Date End Date Taking? Authorizing Provider  acetaminophen (TYLENOL) 325 MG tablet Take 325 mg by mouth every 6 (six) hours as needed for moderate pain or headache.    [provider]  amoxicillin-clavulanate (AUGMENTIN) 875-125 MG tablet Take 1 tablet by mouth every 12 (twelve) hours. 09/04/16    Elnora Morrison, MD  aspirin EC 81 MG tablet Take 81 mg by mouth daily.    [provider]  Cholecalciferol (VITAMIN D-3 PO) Take 5,000 Units by mouth.    [provider]  Homeopathic Products (LEG CRAMP RELIEF PO) Take 1 tablet by mouth every other day.    [provider]  ibuprofen (ADVIL,MOTRIN) 200 MG tablet Take 400 mg by mouth every 6 (six) hours as needed for headache or moderate pain.    [provider]  LORazepam (ATIVAN) 0.5 MG tablet Take 0.5 mg by mouth at bedtime.    [provider]  metoprolol tartrate (LOPRESSOR) 25 MG tablet Take 1 tablet by mouth 2 (two) times daily. 07/27/16   [provider]  omeprazole (PRILOSEC) 20 MG capsule Take 20 mg by mouth Daily. 08/05/12   [provider]  ondansetron (ZOFRAN) 4 MG tablet Take 1 tablet (4 mg total) by mouth every 6 (six) hours as needed for nausea. 10/25/76   Delora Fuel, MD    Family History Family History  Problem Relation Age of Onset  . Diabetes Mother        + brother  . Heart failure Father   . Hypertension Father        + brother  . Cancer Brother        lung, age 31  . Uterine cancer Sister   . Colon cancer Neg Hx     Social History Social History   Tobacco Use  . Smoking status: Former Smoker    Packs/day: 1.00    Years: 30.00    Pack years: 30.00    Types: Cigarettes    Last attempt to quit: 10/15/1970    Years since quitting: 47.1  . Smokeless tobacco: Former Systems developer    Quit date: 09/19/1971  . Tobacco comment: Quit x 50 years  Substance Use Topics  . Alcohol use: No  . Drug use: No     Allergies   Patient has no known allergies.   Review of Systems Review of Systems  Constitutional: Positive for activity change, appetite change, fatigue and fever.  Respiratory: Negative for cough, chest tightness and shortness of breath.   Cardiovascular: Negative for chest pain.  Gastrointestinal: Negative for abdominal pain, nausea and vomiting.    Genitourinary: Negative for dysuria.  Musculoskeletal: Negative for arthralgias, back pain and myalgias.  Neurological: Positive for tremors and weakness. Negative for dizziness, numbness and headaches.    all other systems are negative except as noted in the HPI and PMH.    Physical Exam Updated Vital Signs BP (!) 172/79   Pulse 99   Temp (!) 103.3 F (39.6 C) (Oral)   Resp 20  Ht 5\' 4"  (1.626 m)   Wt 77.1 kg (170 lb)   SpO2 94%   BMI 29.18 kg/m   Physical Exam  Constitutional: She is oriented to person, place, and time. She appears well-developed and well-nourished. No distress.  Anxious appearing, tremulous with shaking chills  HENT:  Head: Normocephalic and atraumatic.  Mouth/Throat: No oropharyngeal exudate.  Very dry mucus membranes  Eyes: Pupils are equal, round, and reactive to light. Conjunctivae and EOM are normal.  Neck: Normal range of motion. Neck supple.  No meningismus.  Cardiovascular: Normal rate, regular rhythm, normal heart sounds and intact distal pulses.  No murmur heard. Pulmonary/Chest: Effort normal and breath sounds normal. No respiratory distress. She exhibits no tenderness.  Abdominal: Soft. There is tenderness. There is no rebound and no guarding.  Obese, mild diffuse tenderness  Musculoskeletal: Normal range of motion. She exhibits no edema or tenderness.  Neurological: She is alert and oriented to person, place, and time. No cranial nerve deficit. She exhibits normal muscle tone. Coordination normal.  No ataxia on finger to nose bilaterally. No pronator drift. 5/5 strength throughout. CN 2-12 intact.Equal grip strength. Sensation intact.   Skin: Skin is warm.  Psychiatric: She has a normal mood and affect. Her behavior is normal.  Nursing note and vitals reviewed.    ED Treatments / Results  Labs (all labs ordered are listed, but only abnormal results are displayed) Labs Reviewed  CBC WITH DIFFERENTIAL/PLATELET - Abnormal; Notable for  the following components:      Result Value   WBC 13.0 (*)    Neutro Abs 11.1 (*)    All other components within normal limits  COMPREHENSIVE METABOLIC PANEL - Abnormal; Notable for the following components:   Glucose, Bld 189 (*)    Creatinine, Ser 1.01 (*)    GFR calc non Af Amer 46 (*)    GFR calc Af Amer 54 (*)    All other components within normal limits  URINALYSIS, ROUTINE W REFLEX MICROSCOPIC - Abnormal; Notable for the following components:   Color, Urine STRAW (*)    Glucose, UA 50 (*)    Ketones, ur 5 (*)    All other components within normal limits  I-STAT CG4 LACTIC ACID, ED - Abnormal; Notable for the following components:   Lactic Acid, Venous 2.62 (*)    All other components within normal limits  I-STAT CG4 LACTIC ACID, ED - Abnormal; Notable for the following components:   Lactic Acid, Venous 4.03 (*)    All other components within normal limits  CULTURE, BLOOD (ROUTINE X 2)  CULTURE, BLOOD (ROUTINE X 2)  URINE CULTURE  TROPONIN I  INFLUENZA PANEL BY PCR (TYPE A & B)    EKG EKG Interpretation  Date/Time:  Saturday December 08 2017 05:03:57 EDT Ventricular Rate:  99 PR Interval:    QRS Duration: 86 QT Interval:  346 QTC Calculation: 444 R Axis:   43 Text Interpretation:  Sinus rhythm Borderline ST depression, diffuse leads Confirmed by Ezequiel Essex [phone]) on 12/08/2017 5:30:48 AM   Radiology Dg Chest 2 View  Result Date: 12/08/2017 CLINICAL DATA:  Fever. EXAM: CHEST - 2 VIEW COMPARISON:  05/28/2015 FINDINGS: Heart size upper normal, likely normal for technique. Chronic hyperinflation and interstitial coarsening. Minimal patchy right suprahilar opacity. No pulmonary edema. No pleural effusion or pneumothorax. No acute osseous abnormalities. IMPRESSION: Minimal patchy right suprahilar opacities suspicious for pneumonia/pneumonitis in the setting of fever. Electronically Signed   By: Fonnie Birkenhead.D.  On: 12/08/2017 06:23    Procedures Procedures  (including critical care time)  Medications Ordered in ED Medications  sodium chloride 0.9 % bolus 1,000 mL (has no administration in time range)  acetaminophen (TYLENOL) tablet 650 mg (has no administration in time range)     Initial Impression / Assessment and Plan / ED Course  I have reviewed the triage vital signs and the nursing notes.  Pertinent labs & imaging results that were available during my care of the patient were reviewed by me and considered in my medical decision making (see chart for details).    Patient was shaking chills and rigors.  She is febrile on arrival.  Family thought anxiety was contributing to this but patient likely has underlying infectious process.  She appears dehydrated with dry mucous membranes.  Patient given IV fluids.  Labs show leukocytosis and lactic acidosis.  Urinalysis is negative.  Chest x-ray shows minimal perihilar opacity.  Given fever and leukocytosis and elevated lactate we will treat for pneumonia. Lactate rising on recheck.  Will give additional fluids.  Patient does not appear to be toxic.  She is saturating well on room air with normal work of breathing.  Given her dehydration and lactic acidosis we will plan admission for IV fluids and IV antibiotics. D/w Dr. Dyann Kief.  Final Clinical Impressions(s) / ED Diagnoses   Final diagnoses:  Community acquired pneumonia, unspecified laterality  Dehydration  Lactic acidosis    ED Discharge Orders    None       Rancour, Annie Main, MD 12/08/17 [phone]

## 2017-12-08 NOTE — ED Notes (Addendum)
Hospitalist at bedside 

## 2017-12-08 NOTE — ED Triage Notes (Signed)
Pt's great grandson was killed in car accident Thursday and since then has not been sleeping well. Pt seen pcp yesterday for the same and started on xanax 0.5mg . Pt states she yesterday afternoon and then before going to bed and woke at 2am with a panic.

## 2017-12-09 LAB — BASIC METABOLIC PANEL
Anion gap: 7 (ref 5–15)
BUN: 11 mg/dL (ref 6–20)
CO2: 24 mmol/L (ref 22–32)
Calcium: 8.3 mg/dL — ABNORMAL LOW (ref 8.9–10.3)
Chloride: 110 mmol/L (ref 101–111)
Creatinine, Ser: 0.9 mg/dL (ref 0.44–1.00)
GFR calc Af Amer: >60 mL/min (ref >60)
GFR calc non Af Amer: 53 mL/min — ABNORMAL LOW (ref >60)
Glucose, Bld: 125 mg/dL — ABNORMAL HIGH (ref 65–99)
Potassium: 3.7 mmol/L (ref 3.5–5.1)
Sodium: 141 mmol/L (ref 135–145)

## 2017-12-09 LAB — CBC
HCT: 33.2 % — ABNORMAL LOW (ref 36.0–46.0)
Hemoglobin: 10.5 g/dL — ABNORMAL LOW (ref 12.0–15.0)
MCH: 29.6 pg (ref 26.0–34.0)
MCHC: 31.6 g/dL (ref 30.0–36.0)
MCV: 93.5 fL (ref 78.0–100.0)
Platelets: 230 10*3/uL (ref 150–400)
RBC: 3.55 MIL/uL — ABNORMAL LOW (ref 3.87–5.11)
RDW: 14.3 % (ref 11.5–15.5)
WBC: 10.4 10*3/uL (ref 4.0–10.5)

## 2017-12-09 LAB — GLUCOSE, CAPILLARY
Glucose-Capillary: 128 mg/dL — ABNORMAL HIGH (ref 65–99)
Glucose-Capillary: 150 mg/dL — ABNORMAL HIGH (ref 65–99)
Glucose-Capillary: 102 mg/dL — ABNORMAL HIGH (ref 65–99)
Glucose-Capillary: 135 mg/dL — ABNORMAL HIGH (ref 65–99)

## 2017-12-09 LAB — URINE CULTURE: Culture: NO GROWTH

## 2017-12-09 LAB — LACTIC ACID, PLASMA: Lactic Acid, Venous: 0.8 mmol/L (ref 0.5–1.9)

## 2017-12-09 MED ORDER — ALPRAZOLAM 0.25 MG PO TABS
0.2500 mg | ORAL_TABLET | Freq: Once | ORAL | Status: AC
  Administered 2017-12-09: 0.25 mg via ORAL
  Filled 2017-12-09: qty 1

## 2017-12-09 MED ORDER — IPRATROPIUM-ALBUTEROL 0.5-2.5 (3) MG/3ML IN SOLN
3.0000 mL | Freq: Three times a day (TID) | RESPIRATORY_TRACT | Status: DC
Start: 2017-12-09 — End: 2017-12-11
  Administered 2017-12-09 – 2017-12-11 (×8): 3 mL via RESPIRATORY_TRACT
  Filled 2017-12-09 (×8): qty 3

## 2017-12-09 MED ORDER — PROCHLORPERAZINE EDISYLATE 5 MG/ML IJ SOLN
10.0000 mg | Freq: Four times a day (QID) | INTRAMUSCULAR | Status: DC | PRN
  Administered 2017-12-09 – 2017-12-11 (×4): 10 mg via INTRAVENOUS
  Filled 2017-12-09 (×4): qty 2

## 2017-12-09 MED ORDER — ALPRAZOLAM 0.5 MG PO TABS
0.5000 mg | ORAL_TABLET | Freq: Three times a day (TID) | ORAL | Status: DC
  Administered 2017-12-09 – 2017-12-11 (×6): 0.5 mg via ORAL
  Filled 2017-12-09 (×6): qty 1

## 2017-12-09 NOTE — Progress Notes (Signed)
Patient does not wish to be awakened for 2 am treatment.

## 2017-12-09 NOTE — Progress Notes (Signed)
[facility] PROGRESS NOTE  Denise Parrish WJX:[id number] DOB: [birth year] DOA: 12/08/2017 PCP: Lemmie Evens, MD  Interim summary and history of present illness. 84 year old fully functional, still driving and living by herself; who has a past medical history significant for anxiety, hypertension, hyperlipidemia, gastroesophageal reflux disease and type 2 diabetes; who presented to the emergency department secondary to fever, no feeling well and nonproductive cough.  Patient with a recent tragic death in the family, which has made her to experience increasing her anxiety and having difficulty sleeping; reported no feeling well at all and with fever the day prior to being admitted.  Patient also have nonproductive cough, start decreasing her p.o. intake and just not feeling well.  There has not been any sick contacts, patient denies chest pain, dysuria, hematuria, abdominal pain, hematemesis, melena, hematochezia or any other acute complaints. Of note, in the ED influenza by PCR was done and was negative. Patient has been taking xanax for ongoing anxiety/emotion control and to help her dealing with recent loss.  Assessment/Plan: 1-sepsis [facility]): Due to right upper lobe community-acquired pneumonia. -Significant improve and sepsis features essentially resolved by now -Will continue current antibiotics as she is to have low-grade temperature overnight -Continue supportive care -Continue DuoNeb, Pulmicort and the use of flutter valve -Follow cultures  2-Diabetes mellitus, type 2 (HCC) -A1c 6.0 -Continue sliding scale insulin -Continue holding oral hypoglycemic medications while inpatient.   3-Hypertension -Blood pressure is stable after fluid resuscitation -Continue watching vital signs without the use of antihypertensive medications. -Continue heart healthy diet.  4-GERD (gastroesophageal reflux disease) -Continue PPI  5-lactic acidosis -Resolved with fluid  resuscitation -Repeat lactic acid within normal limits.  6-Anxiety -Continue Xanax, dose increased to 0.5 mg 3 times a day for better control of her anxiety.  7-Dehydration: In the setting of increased insensible losses due to fever -Resolved with fluid resuscitation -Patient advised to keep herself well-hydrated.  8-muscle spasm/leg cramps -Chronic and idiopathic -Continue as needed Robaxin  9-weakness/deconditioning -Will ask physical therapy to evaluate and provide recommendation for safe discharge plans.  Code Status: Full code Family Communication: Significant other at bedside. Disposition Plan: Stop IV fluids, continue current antibiotics (patient still with low-grade fever overnight), continue nebulizer treatment and supportive care.  Will adjust anxiolytic regimen for better control of her symptoms.   Consultants:  None   Procedures:  See below for x-ray reports   Antibiotics:  Rocephin zithromax 3/23  HPI/Subjective: Feeling very anxious, reporting some nausea and dry cough.  Patient also expressed being weak.  Objective: Vitals:   12/09/17 0604 12/09/17 0804  BP: (!) 126/46   Pulse: 78   Resp: 18   Temp: 100 F (37.8 C)   SpO2: 98% 90%    Intake/Output Summary (Last 24 hours) at 12/09/2017 0959 Last data filed at 12/09/2017 0303 Gross per 24 hour  Intake 1970 ml  Output 300 ml  Net 1670 ml   Filed Weights   12/08/17 0433 12/08/17 1529 12/08/17 1542  Weight: 77.1 kg (170 lb) 80.9 kg (178 lb 4.8 oz) 80.9 kg (178 lb 4 oz)    Exam:   General: No chest pain, patient anxious and having some nausea.  Reports dry cough and experienced low-grade fever overnight.  Overall with improved breathing.  Cardiovascular: S1 and S2, no rubs, no gallops, no JVD.  Respiratory: Positive rhonchi, no wheezing, no frank crackles, fair air movement bilaterally.  No using accessory muscles.  Abdomen: Soft, nontender, nondistended, positive bowel  sounds  Musculoskeletal: No edema,  no cyanosis or clubbing.  Data Reviewed: Basic Metabolic Panel: Recent Labs  Lab 12/08/17 0520 12/08/17 1858 12/09/17 0657  NA 140  --  141  K 4.2  --  3.7  CL 101  --  110  CO2 25  --  24  GLUCOSE 189*  --  125*  BUN 16  --  11  CREATININE 1.01*  --  0.90  CALCIUM 8.9  --  8.3*  MG  --  1.9  --   PHOS  --  2.5  --    Liver Function Tests: Recent Labs  Lab 12/08/17 0520  AST 30  ALT 22  ALKPHOS 77  BILITOT 1.2  PROT 7.3  ALBUMIN 3.8   CBC: Recent Labs  Lab 12/08/17 0520 12/09/17 0657  WBC 13.0* 10.4  NEUTROABS 11.1*  --   HGB 13.1 10.5*  HCT 40.9 33.2*  MCV 93.0 93.5  PLT 270 230   Cardiac Enzymes: Recent Labs  Lab 12/08/17 0520  TROPONINI <0.03   CBG: Recent Labs  Lab 12/08/17 2305 12/09/17 0740  GLUCAP 107* 128*    Recent Results (from the past 240 hour(s))  Blood culture (routine x 2)     Status: None (Preliminary result)   Collection Time: 12/08/17  5:20 AM  Result Value Ref Range Status   Specimen Description RIGHT ANTECUBITAL  Final   Special Requests   Final    BOTTLES DRAWN AEROBIC AND ANAEROBIC Blood Culture adequate volume   Culture  Setup Time   Final    NO ORGANISMS SEEN ANAEROBIC BOTTLE RELOADED, PLATED   Culture   Final    NO GROWTH 1 DAY Performed at [facility], [address], [place], [place] [address]    Report Status PENDING  Incomplete  Blood culture (routine x 2)     Status: None (Preliminary result)   Collection Time: 12/08/17  6:04 AM  Result Value Ref Range Status   Specimen Description LEFT ANTECUBITAL  Final   Special Requests   Final    BOTTLES DRAWN AEROBIC AND ANAEROBIC Blood Culture adequate volume   Culture  Setup Time NO ORGANISMS SEEN AEROBIC BOTTLE RELOADED, PLATED  Final   Culture   Final    NO GROWTH 1 DAY Performed at [facility], [address] [place], [place] [address]    Report Status PENDING  Incomplete  Urine Culture     Status: None   Collection Time:  12/08/17  6:17 AM  Result Value Ref Range Status   Specimen Description   Final    URINE, CATHETERIZED Performed at [facility], [address], [place], [place] [address]    Special Requests   Final    NONE Performed at [facility], [address], [place], [place] [address]    Culture   Final    NO GROWTH Performed at [facility], [place] [address] [place], [place] [address]    Report Status 12/09/2017 FINAL  Final     Studies: Dg Chest 2 View  Result Date: 12/08/2017 CLINICAL DATA:  Fever. EXAM: CHEST - 2 VIEW COMPARISON:  05/28/2015 FINDINGS: Heart size upper normal, likely normal for technique. Chronic hyperinflation and interstitial coarsening. Minimal patchy right suprahilar opacity. No pulmonary edema. No pleural effusion or pneumothorax. No acute osseous abnormalities. IMPRESSION: Minimal patchy right suprahilar opacities suspicious for pneumonia/pneumonitis in the setting of fever. Electronically Signed   By: Jeb Levering M.D.   On: 12/08/2017 06:23    Scheduled Meds: .  ALPRAZolam  0.5 mg Oral TID  . aspirin EC  81 mg Oral Daily  . enoxaparin (LOVENOX) injection  40 mg Subcutaneous Q24H  . insulin aspart  0-5 Units Subcutaneous QHS  . insulin aspart  0-9 Units Subcutaneous TID WC  . ipratropium-albuterol  3 mL Nebulization TID  . magnesium oxide  800 mg Oral Daily  . pantoprazole  40 mg Oral Daily  . potassium chloride SA  20 mEq Oral Daily   Continuous Infusions: . azithromycin 500 mg (12/09/17 0949)  . cefTRIAXone (ROCEPHIN)  IV 1 g (12/09/17 0950)    Principal Problem:   Sepsis ([place]) Active Problems:   Diabetes mellitus, type 2 ([place])   Hypertension   GERD (gastroesophageal reflux disease)   Right upper lobe pneumonia (HCC)   Lactic acidosis   Anxiety   Dehydration fever    Time spent: 25 minutes    [facility] Pager [phone]. If 7PM-7AM, please contact night-coverage at [email or web address], password [facility] 12/09/2017, 9:59  AM  LOS: 1 day

## 2017-12-10 LAB — GLUCOSE, CAPILLARY
Glucose-Capillary: 125 mg/dL — ABNORMAL HIGH (ref 65–99)
Glucose-Capillary: 126 mg/dL — ABNORMAL HIGH (ref 65–99)
Glucose-Capillary: 127 mg/dL — ABNORMAL HIGH (ref 65–99)
Glucose-Capillary: 111 mg/dL — ABNORMAL HIGH (ref 65–99)

## 2017-12-10 NOTE — Progress Notes (Signed)
[facility] PROGRESS NOTE  Denise Parrish ION:[id number] DOB: 1922-10-20 DOA: 12/08/2017 PCP: Lemmie Evens, MD  Interim summary and history of present illness. 84 year old fully functional, still driving and living by herself; who has a past medical history significant for anxiety, hypertension, hyperlipidemia, gastroesophageal reflux disease and type 2 diabetes; who presented to the emergency department secondary to fever, no feeling well and nonproductive cough.  Patient with a recent tragic death in the family, which has made her to experience increasing her anxiety and having difficulty sleeping; reported no feeling well at all and with fever the day prior to being admitted.  Patient also have nonproductive cough, start decreasing her p.o. intake and just not feeling well.  There has not been any sick contacts, patient denies chest pain, dysuria, hematuria, abdominal pain, hematemesis, melena, hematochezia or any other acute complaints. Of note, in the ED influenza by PCR was done and was negative. Patient has been taking xanax for ongoing anxiety/emotion control and to help her dealing with recent loss.  Assessment/Plan: 1-sepsis [facility]): Due to right upper lobe community-acquired pneumonia. -Significant improve and sepsis features essentially resolved by now -Continue current abx's; will transition to PO regimen in am. Patient so far afebrile today. -Continue supportive care -Continue DuoNeb, Pulmicort and the use of flutter valve -cultures so far without growth.   2-Diabetes mellitus, type 2 (HCC) -A1c 6.0 -Continue sliding scale insulin -Continue holding oral hypoglycemic medications while inpatient.   3-Hypertension -Blood pressure is well controlled -Continue heart healthy diet -if remains stable/BP rising, would resume home antihypertensive regimen in am  4-GERD (gastroesophageal reflux disease) -Will Continue PPI  5-lactic acidosis -Resolved with fluid  resuscitation -Repeat lactic acid within normal limits.  6-Anxiety -Continue Xanax, dose increased to 0.5 mg 3 times a day for better control of her anxiety. -Patient is calmer on today's exam  7-Dehydration: In the setting of increased insensible losses due to fever -Resolved with fluid resuscitation -Patient advised to keep herself well-hydrated.  8-muscle spasm/leg cramps -Overall stable -This is a chronic idiopathic problem -Continue as needed Robaxin.  9-weakness/deconditioning -Seen by physical therapy and recommended skilled nursing facility for short-term rehab. -Social work made aware.  Code Status: Full code Family Communication: Significant other at bedside. Disposition Plan: continue current antibiotics,  continue nebulizer treatment and supportive care.  Continue xanax. PT worked with Patient and is recommending SNF.   Consultants:  None   Procedures:  See below for x-ray reports   Antibiotics:  Rocephin and zithromax 3/23  HPI/Subjective: Patient is weak and deconditioned, afebrile, denying any chest pain.  She reports some nausea and is still experiencing shortness of breath with activities.  Objective: Vitals:   12/10/17 0656 12/10/17 0757  BP: (!) 143/70   Pulse: 100   Resp: 19   Temp: 99.2 F (37.3 C)   SpO2: 92% 92%    Intake/Output Summary (Last 24 hours) at 12/10/2017 1250 Last data filed at 12/10/2017 0853 Gross per 24 hour  Intake 1050 ml  Output 100 ml  Net 950 ml   Filed Weights   12/08/17 0433 12/08/17 1529 12/08/17 1542  Weight: 77.1 kg (170 lb) 80.9 kg (178 lb 4.8 oz) 80.9 kg (178 lb 4 oz)    Exam:   General: No chest pain, even is still anxious with much better control of her nerves; weak and deconditioned.  Denies vomiting, abdominal pain and dysuria.  Patient reports some nausea this is still mildly short of breath with activity.  Poor appetite reported.  Cardiovascular: S1 and S2, no rubs, no gallops, no JVD;  positive systolic murmur.    Respiratory: Scattered rhonchi, no wheezing, no crackles, fair air movement bilaterally; no using accessory muscles.  Abdomen: Soft, nontender, nondistended, positive bowel sounds.  Musculoskeletal: No edema, no cyanosis, no cough.  Data Reviewed: Basic Metabolic Panel: Recent Labs  Lab 12/08/17 0520 12/08/17 1858 12/09/17 0657  NA 140  --  141  K 4.2  --  3.7  CL 101  --  110  CO2 25  --  24  GLUCOSE 189*  --  125*  BUN 16  --  11  CREATININE 1.01*  --  0.90  CALCIUM 8.9  --  8.3*  MG  --  1.9  --   PHOS  --  2.5  --    Liver Function Tests: Recent Labs  Lab 12/08/17 0520  AST 30  ALT 22  ALKPHOS 77  BILITOT 1.2  PROT 7.3  ALBUMIN 3.8   CBC: Recent Labs  Lab 12/08/17 0520 12/09/17 0657  WBC 13.0* 10.4  NEUTROABS 11.1*  --   HGB 13.1 10.5*  HCT 40.9 33.2*  MCV 93.0 93.5  PLT 270 230   Cardiac Enzymes: Recent Labs  Lab 12/08/17 0520  TROPONINI <0.03   CBG: Recent Labs  Lab 12/09/17 1146 12/09/17 1713 12/09/17 2138 12/10/17 0730 12/10/17 1115  GLUCAP 150* 102* 135* 125* 126*    Recent Results (from the past 240 hour(s))  Blood culture (routine x 2)     Status: None (Preliminary result)   Collection Time: 12/08/17  5:20 AM  Result Value Ref Range Status   Specimen Description RIGHT ANTECUBITAL  Final   Special Requests   Final    BOTTLES DRAWN AEROBIC AND ANAEROBIC Blood Culture adequate volume   Culture  Setup Time   Final    NO ORGANISMS SEEN ANAEROBIC BOTTLE RELOADED, PLATED   Culture   Final    NO GROWTH 2 DAYS Performed at [facility], [address] [place], [place] [address]    Report Status PENDING  Incomplete  Blood culture (routine x 2)     Status: None (Preliminary result)   Collection Time: 12/08/17  6:04 AM  Result Value Ref Range Status   Specimen Description LEFT ANTECUBITAL  Final   Special Requests   Final    BOTTLES DRAWN AEROBIC AND ANAEROBIC Blood Culture adequate volume   Culture   Setup Time NO ORGANISMS SEEN AEROBIC BOTTLE RELOADED, PLATED  Final   Culture   Final    NO GROWTH 2 DAYS Performed at [facility], [address] [place], [place] [address]    Report Status PENDING  Incomplete  Urine Culture     Status: None   Collection Time: 12/08/17  6:17 AM  Result Value Ref Range Status   Specimen Description   Final    URINE, CATHETERIZED Performed at [facility], [address] [place], [place] [address]    Special Requests   Final    NONE Performed at [facility], [address], [place], [place] [address]    Culture   Final    NO GROWTH Performed at [facility], [place] [address] [place], [place] [address]    Report Status 12/09/2017 FINAL  Final     Studies: No results found.  Scheduled Meds: . ALPRAZolam  0.5 mg Oral TID  . aspirin EC  81 mg Oral Daily  . enoxaparin (LOVENOX) injection  40 mg Subcutaneous Q24H  .  insulin aspart  0-5 Units Subcutaneous QHS  . insulin aspart  0-9 Units Subcutaneous TID WC  . ipratropium-albuterol  3 mL Nebulization TID  . magnesium oxide  800 mg Oral Daily  . pantoprazole  40 mg Oral Daily  . potassium chloride SA  20 mEq Oral Daily   Continuous Infusions: . azithromycin Stopped (12/10/17 1001)  . cefTRIAXone (ROCEPHIN)  IV Stopped (12/10/17 1001)    Principal Problem:   Sepsis ([place]) Active Problems:   Diabetes mellitus, type 2 ([place])   Hypertension   GERD (gastroesophageal reflux disease)   Right upper lobe pneumonia (HCC)   Lactic acidosis   Anxiety   Dehydration fever    Time spent: 25 minutes    [facility] Pager [phone]. If 7PM-7AM, please contact night-coverage at [email or web address], password [facility] 12/10/2017, 12:50 PM  LOS: 2 days

## 2017-12-10 NOTE — Care Management Important Message (Signed)
Important Message  Patient Details  Name: DELANEY PERONA MRN: [id number] Date of Birth: 18-May-1923   Medicare Important Message Given:       [name] 12/10/2017, 12:56 PM

## 2017-12-10 NOTE — Care Management Important Message (Signed)
Important Message  Patient Details  Name: Denise Parrish MRN: [id number] Date of Birth: 27-Nov-1922   Medicare Important Message Given:       [name] 12/10/2017, 12:56 PM

## 2017-12-10 NOTE — Plan of Care (Signed)
  Problem: Acute Rehab PT Goals(only PT should resolve) Goal: Pt Will Go Supine/Side To Sit Outcome: Progressing Flowsheets (Taken 12/10/2017 1457) Pt will go Supine/Side to Sit: with min guard assist Goal: Patient Will Transfer Sit To/From Stand Outcome: Progressing Flowsheets (Taken 12/10/2017 1457) Patient will transfer sit to/from stand: with min guard assist Goal: Pt Will Transfer Bed To Chair/Chair To Bed Outcome: Progressing Flowsheets (Taken 12/10/2017 1457) Pt will Transfer Bed to Chair/Chair to Bed: min guard assist Goal: Pt Will Ambulate Outcome: Progressing Flowsheets (Taken 12/10/2017 1457) Pt will Ambulate: 75 feet;with min guard assist;with rolling walker  2:58 PM, 12/10/17 [name], MPT Physical Therapist with [facility] [phone] office [phone] mobile phone

## 2017-12-10 NOTE — Care Management Important Message (Signed)
Important Message  Patient Details  Name: Denise Parrish MRN: [id number] Date of Birth: 11/17/1922   Medicare Important Message Given:  Yes    Sherald Barge, RN 12/10/2017, 2:59 PM

## 2017-12-10 NOTE — Evaluation (Signed)
Physical Therapy Evaluation Patient Details Name: IRJA WHELESS MRN: [id number] DOB: 1923-08-10 Today's Date: 12/10/2017   History of Present Illness  Nur P Coto is a 84 year old fully functional, still driving and living by herself; who has a past medical history significant for anxiety, hypertension, hyperlipidemia, gastroesophageal reflux disease and type 2 diabetes; who presented to the emergency department secondary to fever, no feeling well and nonproductive cough.  Patient with a recent tragic death in the family, which has made her to experience increasing her anxiety and having difficulty sleeping; reported no feeling well at all and with fever the day prior to being admitted.  Patient also have nonproductive cough, start decreasing her p.o. intake and just not feeling well.  There has not been any sick contacts, patient denies chest pain, dysuria, hematuria, abdominal pain, hematemesis, melena, hematochezia or any other acute complaints.    Clinical Impression  Patient limited for functional mobility as stated below secondary to BLE weakness, fatigue and poor standing balance.  Patient will benefit from continued physical therapy in hospital and recommended venue below to increase strength, balance, endurance for safe ADLs and gait.    Follow Up Recommendations SNF    Equipment Recommendations  None recommended by PT    Recommendations for Other Services       Precautions / Restrictions Precautions Precautions: Fall Restrictions Weight Bearing Restrictions: No      Mobility  Bed Mobility Overal bed mobility: Needs Assistance Bed Mobility: Supine to Sit     Supine to sit: Min assist Sit to supine: Min assist      Transfers Overall transfer level: Needs assistance Equipment used: Rolling walker (2 wheeled) Transfers: Sit to/from [facility] Sit to Stand: Min assist Stand pivot transfers: Min assist           Ambulation/Gait Ambulation/Gait assistance: Min assist Ambulation Distance (Feet): 35 Feet   Gait Pattern/deviations: Decreased step length - right;Decreased step length - left;Decreased stride length   Gait velocity interpretation: Below normal speed for age/gender General Gait Details: slow labored cadence, once fatigued takes longer time with occasional standing rest breaks, becomes more of a fall risk after fatiguing  Stairs            Wheelchair Mobility    Modified Rankin (Stroke Patients Only)       Balance Overall balance assessment: Needs assistance Sitting-balance support: Feet supported;No upper extremity supported Sitting balance-Leahy Scale: Good     Standing balance support: Bilateral upper extremity supported;During functional activity Standing balance-Leahy Scale: Fair                               Pertinent Vitals/Pain Pain Assessment: No/denies pain    Home Living Family/patient expects to be discharged to:: Private residence Living Arrangements: Alone Available Help at Discharge: Family;Other (Comment)(family limited for help) Type of Home: House Home Access: Stairs to enter Entrance Stairs-Rails: Right;Left;Can reach both Entrance Stairs-Number of Steps: 5 Home Layout: One level Home Equipment: Walker - 2 wheels;Bedside commode;Wheelchair - manual      Prior Function Level of Independence: Independent with assistive device(s)         Comments: Ambulates household, short distanced community     Journalist, newspaper        Extremity/Trunk Assessment   Upper Extremity Assessment Upper Extremity Assessment: Generalized weakness    Lower Extremity Assessment Lower Extremity Assessment: Generalized weakness    Cervical / Trunk Assessment Cervical /  Trunk Assessment: Normal  Communication   Communication: No difficulties  Cognition Arousal/Alertness: Awake/alert Behavior During Therapy: WFL for tasks  assessed/performed Overall Cognitive Status: Within Functional Limits for tasks assessed                                        General Comments      Exercises     Assessment/Plan    PT Assessment Patient needs continued PT services  PT Problem List Decreased strength;Decreased activity tolerance;Decreased balance;Decreased mobility       PT Treatment Interventions Gait training;Functional mobility training;Therapeutic activities;Therapeutic exercise;Stair training;Patient/family education    PT Goals (Current goals can be found in the Care Plan section)  Acute Rehab PT Goals Patient Stated Goal: return home after rehab PT Goal Formulation: With patient/family Time For Goal Achievement: 12/24/17 Potential to Achieve Goals: Good    Frequency Min 3X/week   Barriers to discharge        Co-evaluation               AM-PAC PT "6 Clicks" Daily Activity  Outcome Measure Difficulty turning over in bed (including adjusting bedclothes, sheets and blankets)?: A Little Difficulty moving from lying on back to sitting on the side of the bed? : A Lot Difficulty sitting down on and standing up from a chair with arms (e.g., wheelchair, bedside commode, etc,.)?: A Lot Help needed moving to and from a bed to chair (including a wheelchair)?: A Little Help needed walking in hospital room?: A Little Help needed climbing 3-5 steps with a railing? : A Lot 6 Click Score: 15    End of Session Equipment Utilized During Treatment: Gait belt Activity Tolerance: Patient tolerated treatment well;Patient limited by fatigue Patient left: in bed;with call bell/phone within reach;with family/visitor present Nurse Communication: Mobility status PT Visit Diagnosis: Unsteadiness on feet (R26.81);Other abnormalities of gait and mobility (R26.89);Muscle weakness (generalized) (M62.81)    Time: 4709-6283 PT Time Calculation (min) (ACUTE ONLY): 27 min   Charges:   PT  Evaluation $PT Eval Moderate Complexity: 1 Mod PT Treatments $Therapeutic Activity: 23-37 mins   PT G Codes:        2:55 PM, December 30, 2017 [name], MPT Physical Therapist with [facility] [phone] office [phone] mobile phone

## 2017-12-10 NOTE — Care Management Important Message (Signed)
Important Message  Patient Details  Name: Denise Parrish MRN: [id number] Date of Birth: December 18, 1922   Medicare Important Message Given:       [name] 12/10/2017, 12:57 PM

## 2017-12-11 ENCOUNTER — Observation Stay (HOSPITAL_COMMUNITY)
Admission: EM | Admit: 2017-12-11 | Discharge: 2017-12-14 | Disposition: A | Discharge status: Home / Self Care | Payer: Medicare Other | Attending: Internal Medicine | Admitting: Internal Medicine

## 2017-12-11 ENCOUNTER — Ambulatory Visit (HOSPITAL_COMMUNITY)
Admission: RE | Admit: 2017-12-11 | Discharge: 2017-12-11 | Disposition: A | Discharge status: Home / Self Care | Payer: Medicare Other | Source: Ambulatory Visit | Attending: Internal Medicine | Admitting: Internal Medicine

## 2017-12-11 ENCOUNTER — Other Ambulatory Visit: Payer: Self-pay | Admitting: Internal Medicine

## 2017-12-11 ENCOUNTER — Other Ambulatory Visit: Payer: Self-pay

## 2017-12-11 ENCOUNTER — Emergency Department (HOSPITAL_COMMUNITY)
Admission: EM | Admit: 2017-12-11 | Discharge: 2017-12-11 | Discharge status: Absent without leave | Payer: Medicare Other | Source: Home / Self Care

## 2017-12-11 ENCOUNTER — Emergency Department (HOSPITAL_COMMUNITY): Payer: Medicare Other

## 2017-12-11 ENCOUNTER — Non-Acute Institutional Stay (SKILLED_NURSING_FACILITY): Payer: Medicare Other | Admitting: Internal Medicine

## 2017-12-11 ENCOUNTER — Encounter (HOSPITAL_COMMUNITY): Payer: Self-pay | Admitting: Emergency Medicine

## 2017-12-11 ENCOUNTER — Inpatient Hospital Stay
Admission: RE | Admit: 2017-12-11 | Discharge: 2017-12-11 | Disposition: A | Discharge status: Other Acute Inpatient Hospital | Payer: Medicare Other | Source: Ambulatory Visit | Attending: Internal Medicine | Admitting: Internal Medicine

## 2017-12-11 ENCOUNTER — Encounter: Payer: Self-pay | Admitting: Internal Medicine

## 2017-12-11 DIAGNOSIS — J181 Lobar pneumonia, unspecified organism: Secondary | ICD-10-CM

## 2017-12-11 DIAGNOSIS — Z7982 Long term (current) use of aspirin: Secondary | ICD-10-CM | POA: Insufficient documentation

## 2017-12-11 DIAGNOSIS — J9811 Atelectasis: Secondary | ICD-10-CM | POA: Insufficient documentation

## 2017-12-11 DIAGNOSIS — F419 Anxiety disorder, unspecified: Secondary | ICD-10-CM | POA: Insufficient documentation

## 2017-12-11 DIAGNOSIS — R52 Pain, unspecified: Secondary | ICD-10-CM

## 2017-12-11 DIAGNOSIS — M8580 Other specified disorders of bone density and structure, unspecified site: Secondary | ICD-10-CM | POA: Insufficient documentation

## 2017-12-11 DIAGNOSIS — R109 Unspecified abdominal pain: Secondary | ICD-10-CM

## 2017-12-11 DIAGNOSIS — Z96641 Presence of right artificial hip joint: Secondary | ICD-10-CM | POA: Insufficient documentation

## 2017-12-11 DIAGNOSIS — J189 Pneumonia, unspecified organism: Secondary | ICD-10-CM | POA: Insufficient documentation

## 2017-12-11 DIAGNOSIS — R140 Abdominal distension (gaseous): Secondary | ICD-10-CM | POA: Insufficient documentation

## 2017-12-11 DIAGNOSIS — I10 Essential (primary) hypertension: Secondary | ICD-10-CM

## 2017-12-11 DIAGNOSIS — E119 Type 2 diabetes mellitus without complications: Secondary | ICD-10-CM | POA: Diagnosis not present

## 2017-12-11 DIAGNOSIS — A419 Sepsis, unspecified organism: Secondary | ICD-10-CM | POA: Diagnosis not present

## 2017-12-11 DIAGNOSIS — R1011 Right upper quadrant pain: Secondary | ICD-10-CM

## 2017-12-11 DIAGNOSIS — E785 Hyperlipidemia, unspecified: Secondary | ICD-10-CM | POA: Insufficient documentation

## 2017-12-11 DIAGNOSIS — R509 Fever, unspecified: Secondary | ICD-10-CM

## 2017-12-11 DIAGNOSIS — Z7984 Long term (current) use of oral hypoglycemic drugs: Secondary | ICD-10-CM | POA: Insufficient documentation

## 2017-12-11 DIAGNOSIS — K529 Noninfective gastroenteritis and colitis, unspecified: Principal | ICD-10-CM | POA: Insufficient documentation

## 2017-12-11 DIAGNOSIS — K219 Gastro-esophageal reflux disease without esophagitis: Secondary | ICD-10-CM | POA: Insufficient documentation

## 2017-12-11 DIAGNOSIS — K8020 Calculus of gallbladder without cholecystitis without obstruction: Secondary | ICD-10-CM | POA: Insufficient documentation

## 2017-12-11 DIAGNOSIS — Z952 Presence of prosthetic heart valve: Secondary | ICD-10-CM | POA: Insufficient documentation

## 2017-12-11 DIAGNOSIS — K5000 Crohn's disease of small intestine without complications: Secondary | ICD-10-CM | POA: Diagnosis present

## 2017-12-11 DIAGNOSIS — K567 Ileus, unspecified: Secondary | ICD-10-CM | POA: Diagnosis present

## 2017-12-11 DIAGNOSIS — N2889 Other specified disorders of kidney and ureter: Secondary | ICD-10-CM | POA: Insufficient documentation

## 2017-12-11 DIAGNOSIS — E860 Dehydration: Secondary | ICD-10-CM

## 2017-12-11 DIAGNOSIS — K560 Paralytic ileus: Secondary | ICD-10-CM | POA: Insufficient documentation

## 2017-12-11 DIAGNOSIS — J90 Pleural effusion, not elsewhere classified: Secondary | ICD-10-CM | POA: Insufficient documentation

## 2017-12-11 DIAGNOSIS — Z79899 Other long term (current) drug therapy: Secondary | ICD-10-CM | POA: Insufficient documentation

## 2017-12-11 DIAGNOSIS — K5730 Diverticulosis of large intestine without perforation or abscess without bleeding: Secondary | ICD-10-CM | POA: Diagnosis present

## 2017-12-11 LAB — COMPREHENSIVE METABOLIC PANEL
ALT: 18 U/L (ref 14–54)
AST: 24 U/L (ref 15–41)
Albumin: 3.4 g/dL — ABNORMAL LOW (ref 3.5–5.0)
Alkaline Phosphatase: 61 U/L (ref 38–126)
Anion gap: 15 (ref 5–15)
BUN: 10 mg/dL (ref 6–20)
CO2: 21 mmol/L — ABNORMAL LOW (ref 22–32)
Calcium: 8.9 mg/dL (ref 8.9–10.3)
Chloride: 99 mmol/L — ABNORMAL LOW (ref 101–111)
Creatinine, Ser: 0.9 mg/dL (ref 0.44–1.00)
GFR calc Af Amer: >60 mL/min (ref >60)
GFR calc non Af Amer: 53 mL/min — ABNORMAL LOW (ref >60)
Glucose, Bld: 114 mg/dL — ABNORMAL HIGH (ref 65–99)
Potassium: 4.4 mmol/L (ref 3.5–5.1)
Sodium: 135 mmol/L (ref 135–145)
Total Bilirubin: 1.3 mg/dL — ABNORMAL HIGH (ref 0.3–1.2)
Total Protein: 7 g/dL (ref 6.5–8.1)

## 2017-12-11 LAB — CBC
HCT: 38.5 % (ref 36.0–46.0)
Hemoglobin: 12.2 g/dL (ref 12.0–15.0)
MCH: 29.5 pg (ref 26.0–34.0)
MCHC: 31.7 g/dL (ref 30.0–36.0)
MCV: 93.2 fL (ref 78.0–100.0)
Platelets: 283 10*3/uL (ref 150–400)
RBC: 4.13 MIL/uL (ref 3.87–5.11)
RDW: 13.9 % (ref 11.5–15.5)
WBC: 11.6 10*3/uL — ABNORMAL HIGH (ref 4.0–10.5)

## 2017-12-11 LAB — GLUCOSE, CAPILLARY
Glucose-Capillary: 125 mg/dL — ABNORMAL HIGH (ref 65–99)
Glucose-Capillary: 112 mg/dL — ABNORMAL HIGH (ref 65–99)

## 2017-12-11 LAB — LIPASE, BLOOD: Lipase: 23 U/L (ref 11–51)

## 2017-12-11 MED ORDER — IOPAMIDOL (ISOVUE-300) INJECTION 61%
100.0000 mL | Freq: Once | INTRAVENOUS | Status: AC | PRN
  Administered 2017-12-12: 100 mL via INTRAVENOUS

## 2017-12-11 MED ORDER — IBUPROFEN 200 MG PO TABS: 400.0000 mg | ORAL_TABLET | Freq: Three times a day (TID) | ORAL | Status: DC | PRN

## 2017-12-11 MED ORDER — AMOXICILLIN-POT CLAVULANATE 875-125 MG PO TABS: 1.0000 | ORAL_TABLET | Freq: Two times a day (BID) | ORAL | 0 refills | Status: DC

## 2017-12-11 MED ORDER — METHOCARBAMOL 500 MG PO TABS: 500.0000 mg | ORAL_TABLET | Freq: Three times a day (TID) | ORAL | Status: DC | PRN

## 2017-12-11 MED ORDER — ALPRAZOLAM 0.5 MG PO TABS: 0.5000 mg | ORAL_TABLET | Freq: Three times a day (TID) | ORAL | 0 refills | Status: DC

## 2017-12-11 NOTE — ED Triage Notes (Signed)
Pt is from [facility] and was discharged from here today. Family concerned about pt's stomach being so big and pt states her stomach is sore. Pt had xray to rule out obstruction.

## 2017-12-11 NOTE — Progress Notes (Signed)
Report called to [facility].

## 2017-12-11 NOTE — Clinical Social Work Note (Signed)
Clinical Social Work Assessment  Patient Details  Name: Denise Parrish MRN: [id number] Date of Birth: 03-12-1923  Date of referral:  12/11/17               Reason for consult:  Discharge Planning                Permission sought to share information with:  Chartered certified accountant granted to share information::     Name::        Agency::  [facility], [facility]  Relationship::     Contact Information:     Housing/Transportation Living arrangements for the past 2 months:  Single Family Home Source of Information:  Patient Patient Interpreter Needed:  None Criminal Activity/Legal Involvement Pertinent to Current Situation/Hospitalization:  No - Comment as needed Significant Relationships:    Lives with:  Self Do you feel safe going back to the place where you live?  Yes Need for family participation in patient care:  No (Coment)  Care giving concerns: Pt lives alone and PT recommends SNF STR.   Social Worker assessment / plan: Pt is a 84 year old female referred to CSW for SNF rehab placement. Met with pt today to discuss. Pt is reluctantly agreeable to SNF rehab. She states that she will take referral to [facility] or [facility] but no other facility and if she doesn't get into either one, then she is going home. Will start referrals and follow up for potential DC today.  Employment status:  Retired Nurse, adult PT Recommendations:  [place] / Referral to community resources:     Patient/Family's Response to care: Pt accepting of care.   Patient/Family's Understanding of and Emotional Response to Diagnosis, Current Treatment, and Prognosis: Pt appears to understand current diagnosis and treatment recommendations. She verbalizes not being happy about the idea of SNF and states she will go home if she does not get into [facility] or [facility]. Emotional support and encouragement provided.  Emotional Assessment Appearance:  Appears stated  age Attitude/Demeanor/Rapport:    Affect (typically observed):  Accepting, Calm Orientation:  Oriented to Place, Oriented to Self, Oriented to  Time, Oriented to Situation Alcohol / Substance use:  Not Applicable Psych involvement (Current and /or in the community):  No (Comment)  Discharge Needs  Concerns to be addressed:  Discharge Planning Concerns Readmission within the last 30 days:  No Current discharge risk:  Lives alone Barriers to Discharge:  No Barriers Identified   Shade Flood, LCSW 12/11/2017, 10:33 AM

## 2017-12-11 NOTE — Progress Notes (Signed)
Location:   [place] Room Number: 330/Q Place of Service:  SNF (31) Provider:  Alfonzo Feller, MD  Patient Care Team: Lemmie Evens, MD as PCP - General (Family Medicine) Rothbart, Cristopher Estimable, MD (Cardiology) Daneil Dolin, MD (Gastroenterology)  Extended Emergency Contact Information Primary Emergency Contact: Wayland Denis, [place] [address] Johnnette Litter of [place] Phone: [phone] Relation: Daughter Secondary Emergency Contact: Lona Kettle, [place] [address] [address] Litter of [place] Mobile Phone: [phone] Relation: Daughter  Code Status:  Full Code Goals of care: Advanced Directive information Advanced Directives 12/11/2017  Does Patient Have a Medical Advance Directive? Yes  Type of Advance Directive (No Data)  Does patient want to make changes to medical advance directive? No - Patient declined  Copy of [place] in Chart? -  Would patient like information on creating a medical advance directive? No - Patient declined  Pre-existing out of facility DNR order (yellow form or pink MOST form) -     Chief Complaint  Patient presents with  . Hospitalization Follow-up    F/u From Hospitalization Visit  for pneumonia  HPI:  Pt is a 84 y.o. female seen today for follow-up status post hospitalization for pneumonia she is now here for rehab.  Patient is a highly functioning 84 year old who actually was still driving and living by herself.  She has a history of hypertension anxiety hyperlipidemia GERD type 2 diabetes as well as per chart review   History of small bowel obstruction in 2016  She presented to the ER with fever and not feeling well and a cough.  She also had increased anxiety secondary to repair recent tragic death of a family member.  She was thought to be septic  right upper lobe pneumonia and improved during her hospitalization on antibiotics and supportive  care she has been discharged on a 6-day course of Augmentin--  Regards to diabetes she was continued on her home medications, including Amaryl.  Hemoglobin A1c in the hospital.  Her blood pressure was thought to be well-controlle in the hospital- she is on metoprolol    She also has a significant history of anxiety and is on Xanax 3 times a day.    Regards to GERD she hasOmeprazole apparently has been on this for significant amount of time.  Currently respiratory wise she appears to be stable without complaints although still has an occasional cough- Her family is at bedside however and concerned about her abdominal protuberance- They state this is relatively new for her Per chart review she was treated for small bowel obstruction in 2016.  Somewhat questionable if she has had a bowel movement today we are getting varying reports.  She is not really complaining of acute abdominal discomfort and abdomen is soft although is quite protuberant she does have positive bowel sounds  -vital signs appear stable she is borderline tachycardic--with a pulse in the 90s         Past Medical History:  Diagnosis Date  . Anxiety   . Aortic sclerosis    Long-standing heart murmur  . Chest pain 2006   +palpitations; minimal CAD at cath in 2005; normal EF; negative stress echo in 09/2008  . Cholelithiasis    on u/s 09/2011  . Degenerative joint disease    Right THA in 1995; cervical discectomy and fusion-2001  . Diabetes mellitus, type 2 ([place])    +  neuropathy  . Emphysema   . Gastroesophageal reflux disease   . Hiatal hernia   . Hyperlipidemia   . Hypertension   . IDA (iron deficiency anemia)    labs 09/2011  . Obesity   . Osteoporosis   . Sigmoid diverticulosis   . Small bowel lesion    On Given's capsule; Dr Kathleene Hazel 07/2012, no further w/u needed   Past Surgical History:  Procedure Laterality Date  . ANTERIOR CERVICAL DISCECTOMY  2001   C4-5, allograft, fixation  .  BREAST LUMPECTOMY     benign  . CATARACT EXTRACTION W/ INTRAOCULAR LENS IMPLANT  2007   Left  . COLONOSCOPY  2006  . COLONOSCOPY  04/03/12   Dr. Ok Edwards diverticulosis, negative microscopic  colitis   . ESOPHAGOGASTRODUODENOSCOPY  04/03/12   Dr. Jennet Maduro hernia, chronic gastritis on bx  . GIVENS CAPSULE STUDY  05/09/2012   RMR: an unclear raised area of small bowel was noted, with features almost  characteristic of very small polyp. This was without villous  blunting or any evidence of active bleeding; yet, the area of  concern appeared to be erythematous. However, this could simply  be a light reflection on a normal variation of the small bowel. REFERRED TO DR. GILLIAM, APPT FOR NOVEMBER 2013.   . TONSILLECTOMY    . TOTAL HIP ARTHROPLASTY  1995   Right    No Known Allergies  Allergies as of 12/11/2017   No Known Allergies     Medication List        Accurate as of 12/11/17  4:29 PM. Always use your most recent med list.          acetaminophen 325 MG tablet Commonly known as:  TYLENOL Take 325 mg by mouth every 6 (six) hours as needed for moderate pain or headache.   ALPRAZolam 0.5 MG tablet Commonly known as:  XANAX Take 1 tablet (0.5 mg total) by mouth 3 (three) times daily.   amoxicillin-clavulanate 875-125 MG tablet Commonly known as:  AUGMENTIN Take 1 tablet by mouth every 12 (twelve) hours for 6 days.   aspirin EC 81 MG tablet Take by mouth.   glimepiride 1 MG tablet Commonly known as:  AMARYL Take 1 mg by mouth daily.   ibuprofen 200 MG tablet Commonly known as:  ADVIL,MOTRIN Take 2 tablets (400 mg total) by mouth every 8 (eight) hours as needed for headache or moderate pain.   LOPERAMIDE A-D 2 MG tablet Generic drug:  loperamide Take 2 mg by mouth every 12 (twelve) hours as needed for diarrhea or loose stools.   magnesium oxide 400 MG tablet Commonly known as:  MAG-OX Take 800 mg by mouth daily.   methocarbamol 500 MG tablet Commonly known as:   ROBAXIN Take 1 tablet (500 mg total) by mouth every 8 (eight) hours as needed for muscle spasms.   metoprolol tartrate 25 MG tablet Commonly known as:  LOPRESSOR Take 1 tablet by mouth 2 (two) times daily.   omeprazole 20 MG capsule Commonly known as:  PRILOSEC Take 20 mg by mouth Daily.   VITAMIN D-3 PO Take 5,000 Units by mouth.       Review of Systems   In general she is not complaining of fever chills.  Skin does not complain of itching or rashes.  Head ears eyes nose mouth and throat is not complaining of visual changes or sore throat.  Respiratory being treated for pneumonia but does not complain of acute increased shortness of breath  still has somewhat of a residual cough.  Cardiac is not complaining of chest pain or increased lower extremity edema.  GI does have a protuberant abdomen is not complaining of acute abdominal discomfort does have some mild tenderness when is palpated she says this is not new-has had some of this during her hospitalization. Somewhat varying reports if she is had a bowel movement today family does not believe she has had one \\GU  is not complaining of dysuria.--Per granddaughter apparently has only voided once today  Musculoskeletal-is not complaining of joint pain currently but does appear to have some weakness debility.  Neurologic is not complaining of dizziness or headache or syncope at this time.  Psych does not complain of overt anxiety but does have a significant history of this apparently has been stressed with recent loss of a family member  Immunization History  Administered Date(s) Administered  . Influenza,inj,Quad PF,6+ Mos 05/25/2015  . Pneumococcal Polysaccharide-23 05/25/2015   Pertinent  Health Maintenance Due  Topic Date Due  . INFLUENZA VACCINE  01/11/2018 (Originally 04/18/2017)  . FOOT EXAM  01/11/2018 (Originally 11/17/1932)  . OPHTHALMOLOGY EXAM  01/11/2018 (Originally 11/17/1932)  . URINE MICROALBUMIN  01/11/2018  (Originally 07/31/2012)  . PNA vac Low Risk Adult (2 of 2 - PCV13) 01/11/2018 (Originally 05/24/2016)  . HEMOGLOBIN A1C  06/10/2018  . DEXA SCAN  Completed   No flowsheet data found. Functional Status Survey:     Physical Exam   Temperature is 97.8 pulse on auscultation with 96- respirations of 20-blood pressure 130/80 taken manually.  In general this is a very pleasant elderly female who looks somewhat younger than her stated age.  Skin is warm and dry.  Eyes visual acuity appears to be grossly intact sclera and conjunctive are clear.  Oropharynx is clear mucous membranes moist.  Chest is clear to auscultation with somewhat reduced air entry.  Heart is regular rate and rhythm in the high 90s as noted above.  She does not appear to have significant lower extremity edema.  Abdomen is quite protuberant it is soft some mild tenderness to palpation more reaction appears to the invasive maneuver-- bowel sounds are active  Musculoskeletal Limited exam since she is in bed but appears able to move all 4 extremities at baseline with some debilities weakness of her lower extremities I suspect with recent hospitalization.  Neurologic appears grossly intact her speech is clear no lateralizing findings cranial nerves are intact.  Psych she is alert and oriented very pleasant and appropriate  Labs reviewed: Recent Labs    12/08/17 0520 12/08/17 1858 12/09/17 0657  NA 140  --  141  K 4.2  --  3.7  CL 101  --  110  CO2 25  --  24  GLUCOSE 189*  --  125*  BUN 16  --  11  CREATININE 1.01*  --  0.90  CALCIUM 8.9  --  8.3*  MG  --  1.9  --   PHOS  --  2.5  --    Recent Labs    12/08/17 0520  AST 30  ALT 22  ALKPHOS 77  BILITOT 1.2  PROT 7.3  ALBUMIN 3.8   Recent Labs    12/08/17 0520 12/09/17 0657  WBC 13.0* 10.4  NEUTROABS 11.1*  --   HGB 13.1 10.5*  HCT 40.9 33.2*  MCV 93.0 93.5  PLT 270 230   Lab Results  Component Value Date   TSH 1.412 05/23/2015   Lab  Results  Component Value Date   HGBA1C 6.0 (H) 12/08/2017   No results found for: CHOL, HDL, LDLCALC, LDLDIRECT, TRIG, CHOLHDL  Significant Diagnostic Results in last 30 days:  Dg Chest 2 View  Result Date: 12/08/2017 CLINICAL DATA:  Fever. EXAM: CHEST - 2 VIEW COMPARISON:  05/28/2015 FINDINGS: Heart size upper normal, likely normal for technique. Chronic hyperinflation and interstitial coarsening. Minimal patchy right suprahilar opacity. No pulmonary edema. No pleural effusion or pneumothorax. No acute osseous abnormalities. IMPRESSION: Minimal patchy right suprahilar opacities suspicious for pneumonia/pneumonitis in the setting of fever. Electronically Signed   By: Jeb Levering M.D.   On: 12/08/2017 06:23    Assessment/Plan  #1 history of sepsis thought secondary to pneumonia this was treated aggressively in the hospital she has been discharged on a 6-day course of Augmentin at this point appears stable but will need continued monitoring--with supportive care.  2.  History of type 2 diabetes this appears controlled on Amaryl.  Blood sugars in the hospital were in the lower 100s hemoglobin A1c was 6.0 in the hospital at this point will monitor with CBGs before meals and at bedtime.  3.  Hypertension she is on metoprolol at this point blood pressure appears well controlled again we have minimal readings here but we will continue to watch apparently this was stable in the hospital.  4.  History of GERD she continues on her PPI-patient actually was concerned to make sure she was on this since she had been on this at home.  5.  History of anxiety she is on Xanax 0.5 mg 3 times a day will write an order to hold for any sedation or respiratory depression apparently she has tolerated this well in the hospital-again she has had a family tragedy to deal with here.  6.  Apparent history of muscle spasms and cramps in the past she is on as needed Robaxin.  7.  History of weakness with  deconditioning with hospitalization and treatment for pneumonia she is here for rehab PT and OT.  8.  History of abdominal protuberance with history previously of small bowel obstruction- family is quite concerned about this- I did discuss this with Dr. Lyndel Safe as well.  Will order an abdominal x-ray will obtain this in the hospital- and continue to monitor- she is currently not in any distress But per family report this is significantly larger than it was when she was at home.  Of note also will order a CBC and metabolic panel tomorrow.  ADDENDUM---I have been called with the results of the x-ray shows diffuse moderate small bowel dilation mild diffuse gaseous distention of large bowel air-fluid levels throughout the small and large bowel-findings suggestive of a diffuse adynamic ileus but it distal small bowel obstruction cannot be excluded.  Secondary to the x-ray results as well as distention of the abdomen with history of small bowel obstruction will send her to the ER for further studies and workup  I note her last hemoglobin was in the mid tens baseline previously appears to be in the mid 11's to 13- again will await update lab results    [id number]-of note greater than 40 minutes spent assessing patient-reviewing her chart labs- discussing her status with family at bedside-and coordinating and formulating a plan of care for numerous diagnoses- of note greater than 50% of time spent coordinating plan of care

## 2017-12-11 NOTE — NC FL2 (Signed)
[place] LEVEL OF CARE SCREENING TOOL     IDENTIFICATION  Patient Name: Denise Parrish Birthdate: 1923/08/11 Sex: female Admission Date (Current Location): 12/08/2017  [place] and [place] Number:  [place] [facility] [phone] [place] and Address:  [facility] [address], [place]      Provider Number: [id number]  Attending Physician Name and Address:  Barton Dubois, MD  Relative Name and Phone Number:  Darron Doom [phone]    Current Level of Care: Hospital Recommended Level of Care: [place] Prior Approval Number:    Date Approved/Denied:   PASRR Number: pending  Discharge Plan: SNF    Current Diagnoses: Patient Active Problem List   Diagnosis Date Noted  . Right upper lobe pneumonia ([place]) 12/08/2017  . Sepsis ([place]) 12/08/2017  . Lactic acidosis 12/08/2017  . Anxiety 12/08/2017  . Dehydration fever 12/08/2017  . Segmental ileitis of small intestine ([place]) 06/16/2015  . Diverticulosis of colon without hemorrhage 05/25/2015  . SBO (small bowel obstruction) ([place]) 05/23/2015  . Nausea and vomiting in adult   . Nausea vomiting and diarrhea 09/03/2013  . Vomiting 09/03/2013  . Transaminitis 09/03/2013  . Left leg weakness 10/14/2012  . Difficulty in walking(719.7) 10/14/2012  . Small bowel lesion 07/09/2012  . Heme positive stool 03/14/2012  . GERD (gastroesophageal reflux disease) 03/14/2012  . Diarrhea 03/14/2012  . Right carotid bruit 11/01/2011  . Anemia, iron deficiency 10/28/2011  . Palpitations 10/16/2011  . Diabetes mellitus, type 2 ([place])   . Hypertension   . Hyperlipidemia   . Obesity   . Aortic sclerosis     Orientation RESPIRATION BLADDER Height & Weight     Self, Time, Situation, Place  Normal Continent Weight: 178 lb 4 oz (80.9 kg) Height:  5\' 4"  (162.6 cm)  BEHAVIORAL SYMPTOMS/MOOD NEUROLOGICAL BOWEL NUTRITION STATUS      Continent Diet(See dc summary)  AMBULATORY STATUS  COMMUNICATION OF NEEDS Skin   Extensive Assist Verbally Normal                       Personal Care Assistance Level of Assistance  Bathing, Dressing, Feeding Bathing Assistance: Limited assistance Feeding assistance: Independent Dressing Assistance: Limited assistance     Functional Limitations Info  Sight, Hearing, Speech Sight Info: Adequate Hearing Info: Impaired(Pt has hearing aids) Speech Info: Adequate    SPECIAL CARE FACTORS FREQUENCY  PT (By licensed PT)     PT Frequency: 5 times week              Contractures Contractures Info: Not present    Additional Factors Info  Code Status, Allergies, Psychotropic Code Status Info: full Allergies Info: No Known Allergies Psychotropic Info: Xanax, Ativan         Current Medications (12/11/2017):  This is the current hospital active medication list Current Facility-Administered Medications  Medication Dose Route Frequency Provider Last Rate Last Dose  . acetaminophen (TYLENOL) tablet 650 mg  650 mg Oral Q6H PRN Barton Dubois, MD       Or  . acetaminophen (TYLENOL) suppository 650 mg  650 mg Rectal Q6H PRN Barton Dubois, MD   650 mg at 12/09/17 1857  . ALPRAZolam Duanne Moron) tablet 0.5 mg  0.5 mg Oral TID Barton Dubois, MD   0.5 mg at 12/11/17 3235  . aspirin EC tablet 81 mg  81 mg Oral Daily Barton Dubois, MD   81 mg at 12/11/17 5732  . azithromycin (ZITHROMAX) 500 mg in  sodium chloride 0.9 % 250 mL IVPB  500 mg Intravenous Q24H Barton Dubois, MD 250 mL/hr at 12/11/17 [phone]    . cefTRIAXone (ROCEPHIN) 1 g in sodium chloride 0.9 % 100 mL IVPB  1 g Intravenous Q24H Barton Dubois, MD 200 mL/hr at 12/11/17 1022 1 g at 12/11/17 1022  . enoxaparin (LOVENOX) injection 40 mg  40 mg Subcutaneous Q24H Barton Dubois, MD   40 mg at 12/10/17 1331  . ibuprofen (ADVIL,MOTRIN) tablet 400 mg  400 mg Oral Q6H PRN Barton Dubois, MD      . insulin aspart (novoLOG) injection 0-5 Units  0-5 Units Subcutaneous QHS Barton Dubois, MD       . insulin aspart (novoLOG) injection 0-9 Units  0-9 Units Subcutaneous TID WC Barton Dubois, MD   1 Units at 12/11/17 [phone]  . ipratropium-albuterol (DUONEB) 0.5-2.5 (3) MG/3ML nebulizer solution 3 mL  3 mL Nebulization TID Barton Dubois, MD   3 mL at 12/11/17 0724  . magnesium oxide (MAG-OX) tablet 800 mg  800 mg Oral Daily Barton Dubois, MD   800 mg at 12/11/17 0925  . methocarbamol (ROBAXIN) tablet 500 mg  500 mg Oral Q8H PRN Barton Dubois, MD   500 mg at 12/10/17 2152  . ondansetron (ZOFRAN) tablet 4 mg  4 mg Oral Q6H PRN Barton Dubois, MD       Or  . ondansetron [facility]) injection 4 mg  4 mg Intravenous Q6H PRN Barton Dubois, MD   4 mg at 12/10/17 2152  . pantoprazole (PROTONIX) EC tablet 40 mg  40 mg Oral Daily Barton Dubois, MD   40 mg at 12/11/17 0926  . potassium chloride SA (K-DUR,KLOR-CON) CR tablet 20 mEq  20 mEq Oral Daily Barton Dubois, MD   20 mEq at 12/11/17 0926  . prochlorperazine (COMPAZINE) injection 10 mg  10 mg Intravenous Q6H PRN Barton Dubois, MD   10 mg at 12/11/17 0101     Discharge Medications: Please see discharge summary for a list of discharge medications.  Relevant Imaging Results:  Relevant Lab Results:   Additional Information SSN: [id number], [place]

## 2017-12-11 NOTE — Clinical Social Work Placement (Signed)
   CLINICAL SOCIAL WORK PLACEMENT  NOTE  Date:  12/11/2017  Patient Details  Name: Denise Parrish MRN: [id number] Date of Birth: 1923-03-04  Clinical Social Work is seeking post-discharge placement for this patient at the [place] level of care (*CSW will initial, date and re-position this form in  chart as items are completed):  Yes   Patient/family provided with [place] [facility] Department's list of facilities offering this level of care within the geographic area requested by the patient (or if unable, by the patient's family).  Yes   Patient/family informed of their freedom to choose among providers that offer the needed level of care, that participate in Medicare, Medicaid or managed care program needed by the patient, have an available bed and are willing to accept the patient.  Yes   Patient/family informed of [facility]'s ownership interest in [facility] and [facility], as well as of the fact that they are under no obligation to receive care at these facilities.  PASRR submitted to EDS on 12/11/17     PASRR number received on 12/11/17     Existing PASRR number confirmed on       FL2 transmitted to all facilities in geographic area requested by pt/family on 12/11/17     FL2 transmitted to all facilities within larger geographic area on       Patient informed that his/her managed care company has contracts with or will negotiate with certain facilities, including the following:        Yes   Patient/family informed of bed offers received.  Patient chooses bed at [facility]     Physician recommends and patient chooses bed at      Patient to be transferred to [facility] on 12/11/17.  Patient to be transferred to facility by wheelchair     Patient family notified on 12/11/17 of transfer.  Name of family member notified:  [name] (daughter)     PHYSICIAN       Additional Comment: Pt will dc to [facility] today.  Daughter in the room and updated. RN updated. She will call report and transport pt by wheelchair. DC clinical sent through the hub. No other SW needs for dc.   _______________________________________________ Shade Flood, LCSW 12/11/2017, 2:25 PM

## 2017-12-11 NOTE — Discharge Summary (Signed)
Physician Discharge Summary  Denise Parrish KDX:[id number] DOB: 1923/08/06 DOA: 12/08/2017  PCP: Lemmie Evens, MD  Admit date: 12/08/2017 Discharge date: 12/11/2017  Time spent: 35 minutes  Recommendations for Outpatient Follow-up:  1. Repeat basic metabolic panel to follow electrolytes and renal function 2. Repeat chest x-ray in 4-6 weeks to assure complete resolution of infiltrates.   Discharge Diagnoses:  Principal Problem:   Sepsis ([place]) Active Problems:   Diabetes mellitus, type 2 ([place])   Hypertension   GERD (gastroesophageal reflux disease)   Right upper lobe pneumonia (HCC)   Lactic acidosis   Anxiety   Dehydration fever   Discharge Condition: Stable and improved.  Patient has been discharged to skilled nursing facility for further care and rehabilitation.  Diet recommendation: Heart healthy and modified carbohydrate diet.  Filed Weights   12/08/17 0433 12/08/17 1529 12/08/17 1542  Weight: 77.1 kg (170 lb) 80.9 kg (178 lb 4.8 oz) 80.9 kg (178 lb 4 oz)    History of present illness:  84 year old fully functional,still driving and living by herself;who has a past medical history significant for anxiety, hypertension, hyperlipidemia, gastroesophageal reflux diseaseandtype 2 diabetes;who presented to the emergency department secondary to fever, no feeling well and nonproductive cough. Patient with a recent tragic death in the family,which has made her to experience increasing her anxiety and having difficulty sleeping; reported no feeling well at all and with fever the day prior to being admitted. Patient also have nonproductive cough, start decreasing her p.o. intake and just not feeling well. There has not been any sick contacts,patient denies chest pain, dysuria, hematuria, abdominal pain, hematemesis, melena, hematochezia or any other acute complaints. Of note, in the ED influenza by PCR was done and was negative.Patient has been taking xanax for ongoing  anxiety/emotion control and to help her dealing with recent loss.   Hospital Course:  1-sepsis (HCC):Due to right upper lobe community-acquired pneumonia. -Significant improvement in her status and sepsis features essentially resolved by now -Continue current oral abx's to complete therapy.  -Continue supportive care -cultures so far without growth.  -patient discharge to SNF for rehabilitation   2-Diabetes mellitus, type 2 (HCC) -A1c 6.0 -resume home hypoglycemic regimen and modified carb diet  3-Hypertension -Blood pressure is well controlled -Continue heart healthy diet -will resume home antihypertensive agents   4-GERD (gastroesophageal reflux disease) -Will Continue PPI  5-lactic acidosis -Resolved with fluid resuscitation -Repeated lactic acid within normal limits prior to discharge.  6-Anxiety -Continue Xanax, 0.5 mg 3 times a day for anxiety control -patient is calmer and reported that her nerves are better controlled  7-Dehydration:In the setting of increased insensible losses due tofever -Resolved with fluid resuscitation -Patient advised to keep herself well-hydrated.  8-muscle spasm/leg cramps -Overall stable -This is a chronic idiopathic problem -Continue as needed Robaxin.  9-weakness/deconditioning -Seen by physical therapy and recommended skilled nursing facility for short-term rehab. -Social work made aware and patient would be discharge to SNF for physical therapy and rehabilitation.  Procedures:  See below for x-ray reports.  Consultations:  None  Discharge Exam: Vitals:   12/10/17 2136 12/11/17 0724  BP: (!) 149/65   Pulse: (!) 109   Resp: 18   Temp: 99.4 F (37.4 C)   SpO2: 93% 91%     General: No chest pain, even she is still anxious, her nerves are under much better control; reported feeling weak.  Denies vomiting, abdominal pain and dysuria.  still with poor appetite, no nausea or vomiting. Mild SOB with exertion.  Cardiovascular: S1 and S2, no rubs, no gallops, no JVD; positive systolic murmur.    Respiratory: Scattered rhonchi, no wheezing, no crackles, improved air movement bilaterally; no using accessory muscles.  Abdomen: Soft, nontender, nondistended, positive bowel sounds.  Musculoskeletal: No edema, no cyanosis, no cough.    Discharge Instructions   Discharge Instructions    Diet - low sodium heart healthy   Complete by:  As directed    Discharge instructions   Complete by:  As directed    Take medications as prescribed Make sure patient keep yourself well-hydrated Physical therapy and rehabilitation as per skilled nursing facility protocol Follow-up with PCP in 10 days after discharge from skilled nursing facility Repeat BMET in 1 week to follow electrolytes and renal function     Allergies as of 12/11/2017   No Known Allergies     Medication List    STOP taking these medications   LEG CRAMP RELIEF PO   LORazepam 0.5 MG tablet Commonly known as:  ATIVAN     TAKE these medications   acetaminophen 325 MG tablet Commonly known as:  TYLENOL Take 325 mg by mouth every 6 (six) hours as needed for moderate pain or headache.   ALPRAZolam 0.5 MG tablet Commonly known as:  XANAX Take 1 tablet (0.5 mg total) by mouth 3 (three) times daily.   amoxicillin-clavulanate 875-125 MG tablet Commonly known as:  AUGMENTIN Take 1 tablet by mouth every 12 (twelve) hours for 6 days.   aspirin EC 81 MG tablet Take by mouth.   glimepiride 1 MG tablet Commonly known as:  AMARYL Take 1 mg by mouth daily.   ibuprofen 200 MG tablet Commonly known as:  ADVIL,MOTRIN Take 2 tablets (400 mg total) by mouth every 8 (eight) hours as needed for headache or moderate pain. What changed:  when to take this   loperamide 2 MG tablet Commonly known as:  IMODIUM A-D Take by mouth.   magnesium oxide 400 MG tablet Commonly known as:  MAG-OX Take 800 mg by mouth daily.   methocarbamol 500 MG  tablet Commonly known as:  ROBAXIN Take 1 tablet (500 mg total) by mouth every 8 (eight) hours as needed for muscle spasms.   metoprolol tartrate 25 MG tablet Commonly known as:  LOPRESSOR Take 1 tablet by mouth 2 (two) times daily.   omeprazole 20 MG capsule Commonly known as:  PRILOSEC Take 20 mg by mouth Daily.   potassium chloride SA 20 MEQ tablet Commonly known as:  K-DUR,KLOR-CON   VITAMIN D-3 PO Take 5,000 Units by mouth.      No Known Allergies  Contact information for follow-up providers    Lemmie Evens, MD. Schedule an appointment as soon as possible for a visit in 10 day(s).   Specialty:  Family Medicine Why:  after discharge from SNF Contact information: [place] [address] [phone]            Contact information for after-discharge care    [facility] .   Service:  Skilled Nursing Contact information: [address] [place] [phone]                  The results of significant diagnostics from this hospitalization (including imaging, microbiology, ancillary and laboratory) are listed below for reference.    Significant Diagnostic Studies: Dg Chest 2 View  Result Date: 12/08/2017 CLINICAL DATA:  Fever. EXAM: CHEST - 2 VIEW COMPARISON:  05/28/2015 FINDINGS: Heart size upper normal, likely normal for technique. Chronic hyperinflation and interstitial coarsening. Minimal patchy right suprahilar opacity. No pulmonary edema. No pleural effusion or pneumothorax. No acute osseous abnormalities. IMPRESSION: Minimal patchy right suprahilar opacities suspicious for pneumonia/pneumonitis in the setting of fever. Electronically Signed   By: Jeb Levering M.D.   On: 12/08/2017 06:23    Microbiology: Recent Results (from the past 240 hour(s))  Blood culture (routine x 2)     Status: None (Preliminary result)   Collection Time: 12/08/17  5:20 AM  Result Value  Ref Range Status   Specimen Description RIGHT ANTECUBITAL  Final   Special Requests   Final    BOTTLES DRAWN AEROBIC AND ANAEROBIC Blood Culture adequate volume   Culture  Setup Time   Final    NO ORGANISMS SEEN ANAEROBIC BOTTLE RELOADED, PLATED   Culture   Final    NO GROWTH 3 DAYS Performed at [facility], [address] [place], [place] [address]    Report Status PENDING  Incomplete  Blood culture (routine x 2)     Status: None (Preliminary result)   Collection Time: 12/08/17  6:04 AM  Result Value Ref Range Status   Specimen Description LEFT ANTECUBITAL  Final   Special Requests   Final    BOTTLES DRAWN AEROBIC AND ANAEROBIC Blood Culture adequate volume   Culture  Setup Time NO ORGANISMS SEEN AEROBIC BOTTLE RELOADED, PLATED  Final   Culture   Final    NO GROWTH 3 DAYS Performed at [facility], [address] [place], [place] [address]    Report Status PENDING  Incomplete  Urine Culture     Status: None   Collection Time: 12/08/17  6:17 AM  Result Value Ref Range Status   Specimen Description   Final    URINE, CATHETERIZED Performed at [facility], [address], [place], [place] [address]    Special Requests   Final    NONE Performed at [facility], [address] [place], [place] [address]    Culture   Final    NO GROWTH Performed at [facility], [place] [address], [place], [place] [address]    Report Status 12/09/2017 FINAL  Final     Labs: Basic Metabolic Panel: Recent Labs  Lab 12/08/17 0520 12/08/17 1858 12/09/17 0657  NA 140  --  141  K 4.2  --  3.7  CL 101  --  110  CO2 25  --  24  GLUCOSE 189*  --  125*  BUN 16  --  11  CREATININE 1.01*  --  0.90  CALCIUM 8.9  --  8.3*  MG  --  1.9  --   PHOS  --  2.5  --    Liver Function Tests: Recent Labs  Lab 12/08/17 0520  AST 30  ALT 22  ALKPHOS 77  BILITOT 1.2  PROT 7.3  ALBUMIN 3.8   CBC: Recent Labs  Lab 12/08/17 0520 12/09/17 0657  WBC 13.0* 10.4  NEUTROABS 11.1*  --   HGB  13.1 10.5*  HCT 40.9 33.2*  MCV 93.0 93.5  PLT 270 230   Cardiac Enzymes: Recent Labs  Lab 12/08/17 0520  TROPONINI <0.03   CBG: Recent Labs  Lab 12/10/17 1115 12/10/17 1610 12/10/17 2154 12/11/17 0813 12/11/17 1227  GLUCAP 126* 127* 111* 125* 112*     Signed:  Barton Dubois MD.  [facility] 12/11/2017, 1:27 PM

## 2017-12-12 ENCOUNTER — Encounter (HOSPITAL_COMMUNITY): Payer: Self-pay | Admitting: Gastroenterology

## 2017-12-12 ENCOUNTER — Encounter (HOSPITAL_COMMUNITY)
Admission: RE | Admit: 2017-12-12 | Discharge: 2017-12-12 | Disposition: A | Discharge status: Home / Self Care | Payer: Medicare Other | Source: Skilled Nursing Facility | Attending: Internal Medicine | Admitting: Internal Medicine

## 2017-12-12 ENCOUNTER — Other Ambulatory Visit: Payer: Self-pay

## 2017-12-12 ENCOUNTER — Inpatient Hospital Stay (HOSPITAL_COMMUNITY): Payer: Medicare Other

## 2017-12-12 DIAGNOSIS — K5730 Diverticulosis of large intestine without perforation or abscess without bleeding: Secondary | ICD-10-CM | POA: Diagnosis not present

## 2017-12-12 DIAGNOSIS — K567 Ileus, unspecified: Secondary | ICD-10-CM

## 2017-12-12 DIAGNOSIS — I10 Essential (primary) hypertension: Secondary | ICD-10-CM

## 2017-12-12 DIAGNOSIS — K529 Noninfective gastroenteritis and colitis, unspecified: Secondary | ICD-10-CM

## 2017-12-12 DIAGNOSIS — I158 Other secondary hypertension: Secondary | ICD-10-CM | POA: Insufficient documentation

## 2017-12-12 DIAGNOSIS — J181 Lobar pneumonia, unspecified organism: Secondary | ICD-10-CM | POA: Diagnosis not present

## 2017-12-12 DIAGNOSIS — E860 Dehydration: Secondary | ICD-10-CM | POA: Diagnosis not present

## 2017-12-12 DIAGNOSIS — A09 Infectious gastroenteritis and colitis, unspecified: Secondary | ICD-10-CM | POA: Diagnosis not present

## 2017-12-12 LAB — CBC WITH DIFFERENTIAL/PLATELET
Basophils Absolute: 0 10*3/uL (ref 0.0–0.1)
Basophils Relative: 0 %
Eosinophils Absolute: 0.1 10*3/uL (ref 0.0–0.7)
Eosinophils Relative: 1 %
HCT: 35.5 % — ABNORMAL LOW (ref 36.0–46.0)
Hemoglobin: 11.5 g/dL — ABNORMAL LOW (ref 12.0–15.0)
Lymphocytes Relative: 16 %
Lymphs Abs: 1.4 10*3/uL (ref 0.7–4.0)
MCH: 30.3 pg (ref 26.0–34.0)
MCHC: 32.4 g/dL (ref 30.0–36.0)
MCV: 93.7 fL (ref 78.0–100.0)
Monocytes Absolute: 1.1 10*3/uL — ABNORMAL HIGH (ref 0.1–1.0)
Monocytes Relative: 13 %
Neutro Abs: 5.9 10*3/uL (ref 1.7–7.7)
Neutrophils Relative %: 70 %
Platelets: 269 10*3/uL (ref 150–400)
RBC: 3.79 MIL/uL — ABNORMAL LOW (ref 3.87–5.11)
RDW: 13.8 % (ref 11.5–15.5)
WBC: 8.5 10*3/uL (ref 4.0–10.5)

## 2017-12-12 LAB — COMPREHENSIVE METABOLIC PANEL
ALT: 19 U/L (ref 14–54)
AST: 24 U/L (ref 15–41)
Albumin: 2.9 g/dL — ABNORMAL LOW (ref 3.5–5.0)
Alkaline Phosphatase: 55 U/L (ref 38–126)
Anion gap: 12 (ref 5–15)
BUN: 10 mg/dL (ref 6–20)
CO2: 22 mmol/L (ref 22–32)
Calcium: 8.3 mg/dL — ABNORMAL LOW (ref 8.9–10.3)
Chloride: 101 mmol/L (ref 101–111)
Creatinine, Ser: 0.81 mg/dL (ref 0.44–1.00)
GFR calc Af Amer: >60 mL/min (ref >60)
GFR calc non Af Amer: >60 mL/min (ref >60)
Glucose, Bld: 113 mg/dL — ABNORMAL HIGH (ref 65–99)
Potassium: 4.3 mmol/L (ref 3.5–5.1)
Sodium: 135 mmol/L (ref 135–145)
Total Bilirubin: 1.2 mg/dL (ref 0.3–1.2)
Total Protein: 6.2 g/dL — ABNORMAL LOW (ref 6.5–8.1)

## 2017-12-12 LAB — URINALYSIS, ROUTINE W REFLEX MICROSCOPIC
Bilirubin Urine: NEGATIVE
Glucose, UA: NEGATIVE mg/dL
Hgb urine dipstick: NEGATIVE
Ketones, ur: 80 mg/dL — AB
Leukocytes, UA: NEGATIVE
Nitrite: NEGATIVE
Protein, ur: NEGATIVE mg/dL
Specific Gravity, Urine: 1.017 (ref 1.005–1.030)
pH: 5 (ref 5.0–8.0)

## 2017-12-12 LAB — TSH: TSH: 3.149 u[IU]/mL (ref 0.350–4.500)

## 2017-12-12 LAB — T4, FREE: Free T4: 1.09 ng/dL (ref 0.61–1.12)

## 2017-12-12 LAB — C DIFFICILE QUICK SCREEN W PCR REFLEX
C Diff antigen: NEGATIVE
C Diff interpretation: NOT DETECTED
C Diff toxin: NEGATIVE

## 2017-12-12 LAB — MAGNESIUM: Magnesium: 2 mg/dL (ref 1.7–2.4)

## 2017-12-12 MED ORDER — ENOXAPARIN SODIUM 40 MG/0.4ML ~~LOC~~ SOLN
40.0000 mg | SUBCUTANEOUS | Status: DC
  Administered 2017-12-12 – 2017-12-13 (×2): 40 mg via SUBCUTANEOUS
  Filled 2017-12-12 (×2): qty 0.4

## 2017-12-12 MED ORDER — METOPROLOL TARTRATE 5 MG/5ML IV SOLN
2.5000 mg | Freq: Four times a day (QID) | INTRAVENOUS | Status: DC
  Administered 2017-12-12 – 2017-12-13 (×5): 2.5 mg via INTRAVENOUS
  Filled 2017-12-12 (×5): qty 5

## 2017-12-12 MED ORDER — ONDANSETRON HCL 4 MG/2ML IJ SOLN
4.0000 mg | Freq: Once | INTRAMUSCULAR | Status: AC
  Administered 2017-12-12: 4 mg via INTRAVENOUS
  Filled 2017-12-12: qty 2

## 2017-12-12 MED ORDER — ONDANSETRON HCL 4 MG PO TABS: 4.0000 mg | ORAL_TABLET | Freq: Four times a day (QID) | ORAL | Status: DC | PRN

## 2017-12-12 MED ORDER — SODIUM CHLORIDE 0.9 % IV BOLUS: 500.0000 mL | Freq: Once | INTRAVENOUS | Status: DC

## 2017-12-12 MED ORDER — ACETAMINOPHEN 325 MG PO TABS
650.0000 mg | ORAL_TABLET | Freq: Four times a day (QID) | ORAL | Status: DC | PRN
Start: 2017-12-12 — End: 2017-12-14

## 2017-12-12 MED ORDER — SODIUM CHLORIDE 0.9 % IV SOLN
1.0000 g | INTRAVENOUS | Status: DC
  Administered 2017-12-12 – 2017-12-14 (×3): 1 g via INTRAVENOUS
  Filled 2017-12-12 (×3): qty 10
  Filled 2017-12-12: qty 1
  Filled 2017-12-12: qty 10
  Filled 2017-12-12: qty 1

## 2017-12-12 MED ORDER — SODIUM CHLORIDE 0.9 % IV BOLUS
1000.0000 mL | Freq: Once | INTRAVENOUS | Status: AC
  Administered 2017-12-12: 1000 mL via INTRAVENOUS

## 2017-12-12 MED ORDER — PANTOPRAZOLE SODIUM 40 MG IV SOLR
40.0000 mg | INTRAVENOUS | Status: DC
  Administered 2017-12-12 – 2017-12-13 (×2): 40 mg via INTRAVENOUS
  Filled 2017-12-12 (×2): qty 40

## 2017-12-12 MED ORDER — ONDANSETRON HCL 4 MG/2ML IJ SOLN: 4.0000 mg | Freq: Four times a day (QID) | INTRAMUSCULAR | Status: DC | PRN

## 2017-12-12 MED ORDER — ACETAMINOPHEN 650 MG RE SUPP: 650.0000 mg | Freq: Four times a day (QID) | RECTAL | Status: DC | PRN

## 2017-12-12 MED ORDER — SODIUM CHLORIDE 0.9 % IV SOLN
500.0000 mg | INTRAVENOUS | Status: DC
  Administered 2017-12-12 – 2017-12-13 (×2): 500 mg via INTRAVENOUS
  Filled 2017-12-12 (×3): qty 500

## 2017-12-12 MED ORDER — POTASSIUM CHLORIDE IN NACL 20-0.9 MEQ/L-% IV SOLN
INTRAVENOUS | Status: DC
  Administered 2017-12-12 – 2017-12-13 (×2): via INTRAVENOUS
  Filled 2017-12-12: qty 1000

## 2017-12-12 MED ORDER — SODIUM CHLORIDE 0.9 % IV SOLN
INTRAVENOUS | Status: DC
  Administered 2017-12-12 – 2017-12-14 (×3): via INTRAVENOUS

## 2017-12-12 NOTE — ED Notes (Signed)
Have called Dr. Carles Collet about pt's status with C Diff. States she still needs to be monitored and on contact precautions due to GI panel still being in process

## 2017-12-12 NOTE — Clinical Social Work Note (Signed)
Pt re-admitted from [facility] due to ileus. Pt known to LCSW from previous admission. Full assessment note documented on 12/11/17. Pt discharged to [facility] for STR yesterday. Plan will be for return to [facility] when stable. Will follow for support and dc planning as needed.

## 2017-12-12 NOTE — ED Notes (Signed)
Attempted to call to give report, no answer

## 2017-12-12 NOTE — ED Notes (Signed)
Pt ambulatory to bedside commode with 2 person assist.

## 2017-12-12 NOTE — ED Notes (Signed)
Attempted to call report, no answer

## 2017-12-12 NOTE — ED Notes (Signed)
Report given to Bonnie, RN.

## 2017-12-12 NOTE — ED Provider Notes (Signed)
[facility] EMERGENCY DEPARTMENT Provider Note   CSN: [id number] Arrival date & time: 12/11/17  1954     History   Chief Complaint Chief Complaint  Patient presents with  . Abdominal Pain    HPI Denise Parrish is a 84 y.o. female.  HPI  This is a 84 year old female with a recent history of pneumonia, diabetes, hypertension, hyperlipidemia who presents with abdominal distention.  Patient was just discharged yesterday to the [facility].  She had been admitted for pneumonia.  Daughter reports over the last 24 hours she has had progressively distended abdomen.  She reports previously she had some nausea but none in the last 24 hours.  She had one episode of diarrhea 2 days ago but had no bowel movements while in the hospital.  She was seen and evaluated by the doctor at the living facility and got an x-ray that was concerning for ileus versus obstruction.  Patient only reports that her abdomen is "sore."  She denies any chest pain, shortness of breath, recurrent fevers.  Past Medical History:  Diagnosis Date  . Anxiety   . Aortic sclerosis    Long-standing heart murmur  . Chest pain 2006   +palpitations; minimal CAD at cath in 2005; normal EF; negative stress echo in 09/2008  . Cholelithiasis    on u/s 09/2011  . Degenerative joint disease    Right THA in 1995; cervical discectomy and fusion-2001  . Diabetes mellitus, type 2 (HCC)    + neuropathy  . Emphysema   . Gastroesophageal reflux disease   . Hiatal hernia   . Hyperlipidemia   . Hypertension   . IDA (iron deficiency anemia)    labs 09/2011  . Obesity   . Osteoporosis   . Sigmoid diverticulosis   . Small bowel lesion    On Given's capsule; Dr Kathleene Hazel 07/2012, no further w/u needed    Patient Active Problem List   Diagnosis Date Noted  . Ileus ([place]) 12/12/2017  . Right upper lobe pneumonia ([place]) 12/08/2017  . Sepsis ([place]) 12/08/2017  . Lactic acidosis 12/08/2017  . Anxiety 12/08/2017  . Dehydration  fever 12/08/2017  . Segmental ileitis of small intestine ([place]) 06/16/2015  . Diverticulosis of colon without hemorrhage 05/25/2015  . SBO (small bowel obstruction) ([place]) 05/23/2015  . Nausea and vomiting in adult   . Nausea vomiting and diarrhea 09/03/2013  . Vomiting 09/03/2013  . Transaminitis 09/03/2013  . Left leg weakness 10/14/2012  . Difficulty in walking(719.7) 10/14/2012  . Small bowel lesion 07/09/2012  . Heme positive stool 03/14/2012  . GERD (gastroesophageal reflux disease) 03/14/2012  . Diarrhea 03/14/2012  . Right carotid bruit 11/01/2011  . Anemia, iron deficiency 10/28/2011  . Palpitations 10/16/2011  . Diabetes mellitus, type 2 ([place])   . Hypertension   . Hyperlipidemia   . Obesity   . Aortic sclerosis     Past Surgical History:  Procedure Laterality Date  . ANTERIOR CERVICAL DISCECTOMY  2001   C4-5, allograft, fixation  . BREAST LUMPECTOMY     benign  . CATARACT EXTRACTION W/ INTRAOCULAR LENS IMPLANT  2007   Left  . COLONOSCOPY  2006  . COLONOSCOPY  04/03/12   Dr. Ok Edwards diverticulosis, negative microscopic  colitis   . ESOPHAGOGASTRODUODENOSCOPY  04/03/12   Dr. Jennet Maduro hernia, chronic gastritis on bx  . GIVENS CAPSULE STUDY  05/09/2012   RMR: an unclear raised area of small bowel was noted, with features almost  characteristic of very small polyp. This  was without villous  blunting or any evidence of active bleeding; yet, the area of  concern appeared to be erythematous. However, this could simply  be a light reflection on a normal variation of the small bowel. REFERRED TO DR. GILLIAM, APPT FOR NOVEMBER 2013.   . TONSILLECTOMY    . TOTAL HIP ARTHROPLASTY  1995   Right     OB History   None      Home Medications    Prior to Admission medications   Medication Sig Start Date End Date Taking? Authorizing Provider  acetaminophen (TYLENOL) 325 MG tablet Take 325 mg by mouth every 6 (six) hours as needed for moderate pain or headache.   Yes  [provider]  ALPRAZolam (XANAX) 0.5 MG tablet Take 1 tablet (0.5 mg total) by mouth 3 (three) times daily. 12/11/17  Yes Barton Dubois, MD  amoxicillin-clavulanate (AUGMENTIN) 875-125 MG tablet Take 1 tablet by mouth every 12 (twelve) hours for 6 days. 12/11/17 12/17/17 Yes Barton Dubois, MD  aspirin EC 81 MG tablet Take 81 mg by mouth daily.    Yes [provider]  Cholecalciferol (VITAMIN D-3 PO) Take 5,000 Units by mouth daily.    Yes [provider]  glimepiride (AMARYL) 1 MG tablet Take 1 mg by mouth daily.  09/17/17  Yes [provider]  ibuprofen (ADVIL,MOTRIN) 200 MG tablet Take 2 tablets (400 mg total) by mouth every 8 (eight) hours as needed for headache or moderate pain. 12/11/17  Yes Barton Dubois, MD  loperamide (LOPERAMIDE A-D) 2 MG tablet Take 2 mg by mouth every 12 (twelve) hours as needed for diarrhea or loose stools.   Yes [provider]  magnesium oxide (MAG-OX) 400 MG tablet Take 800 mg by mouth daily.   Yes [provider]  methocarbamol (ROBAXIN) 500 MG tablet Take 1 tablet (500 mg total) by mouth every 8 (eight) hours as needed for muscle spasms. 12/11/17  Yes Barton Dubois, MD  metoprolol tartrate (LOPRESSOR) 25 MG tablet Take 1 tablet by mouth 2 (two) times daily. 07/27/16  Yes [provider]  omeprazole (PRILOSEC) 20 MG capsule Take 20 mg by mouth Daily. 08/05/12  Yes [provider]    Family History Family History  Problem Relation Age of Onset  . Diabetes Mother        + brother  . Heart failure Father   . Hypertension Father        + brother  . Cancer Brother        lung, age 31  . Uterine cancer Sister   . Colon cancer Neg Hx     Social History Social History   Tobacco Use  . Smoking status: Former Smoker    Packs/day: 1.00    Years: 30.00    Pack years: 30.00    Types: Cigarettes    Last attempt to quit: 10/15/1970    Years since quitting: 47.1  . Smokeless tobacco: Former  Systems developer    Quit date: 09/19/1971  . Tobacco comment: Quit x 50 years  Substance Use Topics  . Alcohol use: No  . Drug use: No     Allergies   Patient has no known allergies.   Review of Systems Review of Systems  Constitutional: Negative for fever.  Respiratory: Negative for shortness of breath.   Cardiovascular: Negative for chest pain.  Gastrointestinal: Positive for abdominal pain, diarrhea and nausea. Negative for vomiting.  Genitourinary: Negative for dysuria.  All other systems reviewed and are  negative.    Physical Exam Updated Vital Signs BP (!) 133/55   Pulse (!) 115   Temp 98.5 F (36.9 C) (Oral)   Resp 20   Ht 5\' 4"  (1.626 m)   Wt 80.7 kg (178 lb)   SpO2 93%   BMI 30.55 kg/m   Physical Exam  Constitutional: She is oriented to person, place, and time.  Elderly, overweight, no acute distress, non-ill-appearing  HENT:  Head: Normocephalic and atraumatic.  Neck: Neck supple.  Cardiovascular: Normal rate, regular rhythm and normal heart sounds.  Pulmonary/Chest: Effort normal. No respiratory distress. She has no wheezes.  Abdominal: Soft. Bowel sounds are increased.  Increased bowel sounds, overall distended abdomen, diffuse tenderness to palpation without rebound or guarding  Neurological: She is alert and oriented to person, place, and time.  Skin: Skin is warm and dry.  Psychiatric: She has a normal mood and affect.  Nursing note and vitals reviewed.    ED Treatments / Results  Labs (all labs ordered are listed, but only abnormal results are displayed) Labs Reviewed  COMPREHENSIVE METABOLIC PANEL - Abnormal; Notable for the following components:      Result Value   Chloride 99 (*)    CO2 21 (*)    Glucose, Bld 114 (*)    Albumin 3.4 (*)    Total Bilirubin 1.3 (*)    GFR calc non Af Amer 53 (*)    All other components within normal limits  CBC - Abnormal; Notable for the following components:   WBC 11.6 (*)    All other components within normal  limits  URINALYSIS, ROUTINE W REFLEX MICROSCOPIC - Abnormal; Notable for the following components:   Ketones, ur 80 (*)    All other components within normal limits  LIPASE, BLOOD  MAGNESIUM    EKG None  Radiology Ct Abdomen Pelvis W Contrast  Result Date: 12/12/2017 CLINICAL DATA:  84 year old female with abdominal distention. EXAM: CT ABDOMEN AND PELVIS WITH CONTRAST TECHNIQUE: Multidetector CT imaging of the abdomen and pelvis was performed using the standard protocol following bolus administration of intravenous contrast. CONTRAST:  164mL ISOVUE-300 IOPAMIDOL (ISOVUE-300) INJECTION 61% COMPARISON:  Abdominal radiograph dated 12/11/2017 and CT dated 09/04/2016 FINDINGS: Lower chest: Small bilateral pleural effusions with associated mild partial compressive atelectasis of the lower lobes. Minimal right lung base atelectasis/scarring. Pneumonia is not entirely excluded. Clinical correlation is recommended. Partially visualized mechanical mitral valve. No intra-abdominal free air.  Trace free fluid within the pelvis. Hepatobiliary: The liver is unremarkable. There is a 3 cm stone within the gallbladder. The gallbladder is mildly distended. No pericholecystic fluid or evidence of acute cholecystitis by CT. Pancreas: Unremarkable. No pancreatic ductal dilatation or surrounding inflammatory changes. Spleen: Normal in size without focal abnormality. Adrenals/Urinary Tract: Left adrenal thickening/hyperplasia. The right adrenal gland is unremarkable. There is bilateral extrarenal pelvis with mild pelviectasis. Multiple small bilateral renal hypodense lesions noted. The largest lesion in the upper pole of the left kidney measures 4 cm consistent with cysts. The smaller lesions are too small to characterize. There is symmetric enhancement and excretion of contrast by both kidneys. The visualized ureters and urinary bladder appear unremarkable. Stomach/Bowel: There is extensive sigmoid diverticulosis without  active inflammatory changes. Multiple mildly dilated and fluid-filled loops of small bowel noted in the mid to lower abdomen. There is thickened and mildly inflamed loops of small bowel in the lower abdomen and pelvis. Findings most consistent with enteritis with associated ileus. No definite transition zone identified to suggest obstruction.  Loose stool within the colon compatible with diarrheal state. The appendix is normal. Vascular/Lymphatic: Moderate aortoiliac atherosclerotic disease. No portal venous gas. There is no adenopathy. Reproductive: The uterus and ovaries are grossly unremarkable as visualized. Other: None Musculoskeletal: Osteopenia with degenerative changes of the spine. Total right hip arthroplasty with associated streak artifact. Left hip arthritic changes. No acute osseous pathology. IMPRESSION: 1. Diarrheal state with findings of enteritis and associated mild ileus. Correlation with clinical exam and stool cultures recommended. No definite evidence of bowel obstruction. Normal appendix. 2. Extensive sigmoid diverticulosis without active inflammatory changes. 3. Cholelithiasis. 4.  Aortic Atherosclerosis (ICD10-I70.0). Electronically Signed   By: Anner Crete M.D.   On: 12/12/2017 00:57   Dg Abd Acute W/chest  Result Date: 12/11/2017 CLINICAL DATA:  Abdominal distension.  Chest pain. EXAM: DG ABDOMEN ACUTE W/ 1V CHEST COMPARISON:  12/08/2017 chest radiograph. 09/04/2016 CT abdomen/pelvis. 05/28/2015 abdominal radiographs. FINDINGS: Surgical hardware from ACDF is partially visualized overlying the lower cervical spine. Stable cardiomediastinal silhouette with normal heart size. No pneumothorax. No pleural effusion. No pulmonary edema. No acute consolidative airspace disease. There are moderately dilated small bowel loops throughout the abdomen demonstrating air-fluid levels, measuring up to 5.4 cm diameter. There is mild gaseous distention of the large bowel with scattered air-fluid  levels throughout the colon. No evidence of pneumatosis or pneumoperitoneum. No radiopaque nephrolithiasis. Partially visualized right hip hemiarthroplasty. IMPRESSION: 1. No active cardiopulmonary disease. 2. Diffuse moderate small bowel dilatation. Mild diffuse gaseous distention of the large bowel. Air-fluid levels throughout the small and large bowel. Findings are most suggestive of a diffuse adynamic ileus, although a distal small bowel obstruction cannot be excluded. Electronically Signed   By: Ilona Sorrel M.D.   On: 12/11/2017 19:40    Procedures Procedures (including critical care time)  Medications Ordered in ED Medications  sodium chloride 0.9 % bolus 1,000 mL (1,000 mLs Intravenous New Bag/Given 12/12/17 0132)  0.9 %  sodium chloride infusion (has no administration in time range)  ondansetron (ZOFRAN) tablet 4 mg (has no administration in time range)    Or  ondansetron (ZOFRAN) injection 4 mg (has no administration in time range)  iopamidol (ISOVUE-300) 61 % injection 100 mL (100 mLs Intravenous Contrast Given 12/12/17 0023)  ondansetron (ZOFRAN) injection 4 mg (4 mg Intravenous Given 12/12/17 0132)     Initial Impression / Assessment and Plan / ED Course  I have reviewed the triage vital signs and the nursing notes.  Pertinent labs & imaging results that were available during my care of the patient were reviewed by me and considered in my medical decision making (see chart for details).     She presents with concerns for increasing abdominal distention.  Notably tachycardic to 114.  She is otherwise in no acute distress and vital signs are reassuring.  She has some diffuse tenderness on exam without signs of peritonitis.  Bowel sounds are hyperactive.  Considerations include ileus, bowel obstruction.  Less likely appendicitis or other focal pathology.  Lab work obtained and largely reassuring.  X-ray reviewed and shows concern for ileus.  CT scan obtained and confirms ileus.  No  obvious obstruction.  Patient given fluids.  She has 80 ketones in her urine which is suggestive of dehydration.  Given her age and symptoms, feel she would benefit from n.p.o. status for ileus to resolve and IV hydration.  Discussed with admitting hospitalist.  Final Clinical Impressions(s) / ED Diagnoses   Final diagnoses:  Ileus ([place])  Dehydration  ED Discharge Orders    None       Horton, Barbette Hair, MD 12/12/17 0140

## 2017-12-12 NOTE — Consult Note (Addendum)
Referring Provider: Orson Eva, MD Primary Care Physician:  Lemmie Evens, MD Primary Gastroenterologist:  Garfield Cornea, MD  Reason for Consultation:  enteritis  HPI: Denise Parrish is a 84 y.o. female with history of hypertension, hyperlipidemia, anxiety, type 2 diabetes mellitus, GERD who presented from the [facility] for abdominal distention of one day duration.  Patient was admitted to the hospital from March 23 - March 26 with sepsis due to right upper lobe community-acquired pneumonia.  She was discharged to the nursing facility for rehabilitation on Augmentin.  During her hospitalization she did receive Rocephin and Zithromax as well.  She had also been suffering with some increased anxiety after the recent death of her great grandson via MVA at age 57 last week.    Patient tells me she barely got settled in at the [facility] before it was noted that she had some increasing abdominal distention over 24 hours.  She had plain abdominal films showing diffuse small bowel dilatation with gaseous distention of the small and large bowel with air-fluid levels.  She was referred to the emergency department.  Patient has had diminished oral intake in the setting of her recent illness.  She describes having dry heaves over the past week but none in the past 24 hours.  She denies blood in the stool or melena.  She has had some vague abdominal discomfort.  CT scan yesterday showed 3 cm stone within the gallbladder, gallbladder mildly distended but no pericholecystic fluid.  Multiple mildly dilated and fluid-filled loops of small bowel noted in the mid to lower abdomen.  There is thickened and mildly inflamed loops of small bowel in the lower abdomen and pelvis.  Findings most consistent with enteritis with associated ileus.  Loose stool within the colon compatible with diarrheal state.  Appendix is normal.  Patient reports several loose stools today.  Nonbloody.     Prior to Admission  medications   Medication Sig Start Date End Date Taking? Authorizing Provider  acetaminophen (TYLENOL) 325 MG tablet Take 325 mg by mouth every 6 (six) hours as needed for moderate pain or headache.   Yes [provider]  ALPRAZolam (XANAX) 0.5 MG tablet Take 1 tablet (0.5 mg total) by mouth 3 (three) times daily. 12/11/17  Yes Barton Dubois, MD  amoxicillin-clavulanate (AUGMENTIN) 875-125 MG tablet Take 1 tablet by mouth every 12 (twelve) hours for 6 days. 12/11/17 12/17/17 Yes Barton Dubois, MD  aspirin EC 81 MG tablet Take 81 mg by mouth daily.    Yes [provider]  Cholecalciferol (VITAMIN D-3 PO) Take 5,000 Units by mouth daily.    Yes [provider]  glimepiride (AMARYL) 1 MG tablet Take 1 mg by mouth daily.  09/17/17  Yes [provider]  ibuprofen (ADVIL,MOTRIN) 200 MG tablet Take 2 tablets (400 mg total) by mouth every 8 (eight) hours as needed for headache or moderate pain. 12/11/17  Yes Barton Dubois, MD  loperamide (LOPERAMIDE A-D) 2 MG tablet Take 2 mg by mouth every 12 (twelve) hours as needed for diarrhea or loose stools.   Yes [provider]  magnesium oxide (MAG-OX) 400 MG tablet Take 800 mg by mouth daily.   Yes [provider]  methocarbamol (ROBAXIN) 500 MG tablet Take 1 tablet (500 mg total) by mouth every 8 (eight) hours as needed for muscle spasms. 12/11/17  Yes Barton Dubois, MD  metoprolol tartrate (LOPRESSOR) 25 MG tablet Take 1 tablet by mouth 2 (two) times daily. 07/27/16  Yes  [provider]  omeprazole (PRILOSEC) 20 MG capsule Take 20 mg by mouth Daily. 08/05/12  Yes [provider]    Current Facility-Administered Medications  Medication Dose Route Frequency Provider Last Rate Last Dose  . 0.9 %  sodium chloride infusion   Intravenous Continuous Reubin Milan, MD 75 mL/hr at 12/12/17 0351    . ondansetron (ZOFRAN) tablet 4 mg  4 mg Oral Q6H PRN Reubin Milan, MD       Or  .  ondansetron [facility]) injection 4 mg  4 mg Intravenous Q6H PRN Reubin Milan, MD       Current Outpatient Medications  Medication Sig Dispense Refill  . acetaminophen (TYLENOL) 325 MG tablet Take 325 mg by mouth every 6 (six) hours as needed for moderate pain or headache.    . ALPRAZolam (XANAX) 0.5 MG tablet Take 1 tablet (0.5 mg total) by mouth 3 (three) times daily. 15 tablet 0  . amoxicillin-clavulanate (AUGMENTIN) 875-125 MG tablet Take 1 tablet by mouth every 12 (twelve) hours for 6 days. 12 tablet 0  . aspirin EC 81 MG tablet Take 81 mg by mouth daily.     . Cholecalciferol (VITAMIN D-3 PO) Take 5,000 Units by mouth daily.     [name] glimepiride (AMARYL) 1 MG tablet Take 1 mg by mouth daily.     [name] ibuprofen (ADVIL,MOTRIN) 200 MG tablet Take 2 tablets (400 mg total) by mouth every 8 (eight) hours as needed for headache or moderate pain.    [name] loperamide (LOPERAMIDE A-D) 2 MG tablet Take 2 mg by mouth every 12 (twelve) hours as needed for diarrhea or loose stools.    . magnesium oxide (MAG-OX) 400 MG tablet Take 800 mg by mouth daily.    . methocarbamol (ROBAXIN) 500 MG tablet Take 1 tablet (500 mg total) by mouth every 8 (eight) hours as needed for muscle spasms.    . metoprolol tartrate (LOPRESSOR) 25 MG tablet Take 1 tablet by mouth 2 (two) times daily.    [name] omeprazole (PRILOSEC) 20 MG capsule Take 20 mg by mouth Daily.      Allergies as of 12/11/2017  . (No Known Allergies)    Past Medical History:  Diagnosis Date  . Anxiety   . Aortic sclerosis    Long-standing heart murmur  . Chest pain 2006   +palpitations; minimal CAD at cath in 2005; normal EF; negative stress echo in 09/2008  . Cholelithiasis    on u/s 09/2011  . Degenerative joint disease    Right THA in 1995; cervical discectomy and fusion-2001  . Diabetes mellitus, type 2 (HCC)    + neuropathy  . Emphysema   . Gastroesophageal reflux disease   . Hiatal hernia   . Hyperlipidemia   . Hypertension   . IDA (iron  deficiency anemia)    labs 09/2011  . Obesity   . Osteoporosis   . Sigmoid diverticulosis   . Small bowel lesion    On Given's capsule; Dr Kathleene Hazel 07/2012, no further w/u needed    Past Surgical History:  Procedure Laterality Date  . ANTERIOR CERVICAL DISCECTOMY  2001   C4-5, allograft, fixation  . BREAST LUMPECTOMY     benign  . CATARACT EXTRACTION W/ INTRAOCULAR LENS IMPLANT  2007   Left  . COLONOSCOPY  2006  . COLONOSCOPY  04/03/12   Dr. Ok Edwards diverticulosis, negative microscopic  colitis   . ESOPHAGOGASTRODUODENOSCOPY  04/03/12   Dr. Jennet Maduro hernia, chronic gastritis on bx  .  GIVENS CAPSULE STUDY  05/09/2012   RMR: an unclear raised area of small bowel was noted, with features almost  characteristic of very small polyp. This was without villous  blunting or any evidence of active bleeding; yet, the area of  concern appeared to be erythematous. However, this could simply  be a light reflection on a normal variation of the small bowel. REFERRED TO DR. GILLIAM, APPT FOR NOVEMBER 2013.   . TONSILLECTOMY    . TOTAL HIP ARTHROPLASTY  1995   Right    Family History  Problem Relation Age of Onset  . Diabetes Mother        + brother  . Heart failure Father   . Hypertension Father        + brother  . Cancer Brother        lung, age 39  . Uterine cancer Sister   . Colon cancer Neg Hx     Social History   Socioeconomic History  . Marital status: Divorced    Spouse name: Not on file  . Number of children: 4  . Years of education: Not on file  . Highest education level: Not on file  Occupational History  . Occupation: Retired    Comment: [employer]  Social Needs  . Financial resource strain: Not on file  . Food insecurity:    Worry: Not on file    Inability: Not on file  . Transportation needs:    Medical: Not on file    Non-medical: Not on file  Tobacco Use  . Smoking status: Former Smoker    Packs/day: 1.00    Years: 30.00    Pack years: 30.00     Types: Cigarettes    Last attempt to quit: 10/15/1970    Years since quitting: 47.1  . Smokeless tobacco: Former Systems developer    Quit date: 09/19/1971  . Tobacco comment: Quit x 50 years  Substance and Sexual Activity  . Alcohol use: No  . Drug use: No  . Sexual activity: Never  Lifestyle  . Physical activity:    Days per week: Not on file    Minutes per session: Not on file  . Stress: Not on file  Relationships  . Social connections:    Talks on phone: Not on file    Gets together: Not on file    Attends religious service: Not on file    Active member of club or organization: Not on file    Attends meetings of clubs or organizations: Not on file    Relationship status: Not on file  . Intimate partner violence:    Fear of current or ex partner: Not on file    Emotionally abused: Not on file    Physically abused: Not on file    Forced sexual activity: Not on file  Other Topics Concern  . Not on file  Social History Narrative  . Not on file     ROS:  General: Negative forweight loss, fever, chills, fatigue, weakness.  Poor appetite. Eyes: Negative for vision changes.  ENT: Negative for hoarseness, difficulty swallowing , nasal congestion. CV: Negative for chest pain, angina, palpitations, dyspnea on exertion, peripheral edema.  Respiratory: Negative for dyspnea at rest, dyspnea on exertion, cough, sputum, wheezing.  GI: See history of present illness. GU:  Negative for dysuria, hematuria, urinary incontinence, urinary frequency, nocturnal urination.  MS: Negative for joint pain, low back pain.  Derm: Negative for rash or itching.  Neuro: Negative for weakness, abnormal  sensation, seizure, frequent headaches, memory loss, confusion.  Psych: Negative for  depression, suicidal ideation, hallucinations.  Positive anxiety. Endo: Negative for unusual weight change.  Heme: Negative for bruising or bleeding. Allergy: Negative for rash or hives.       Physical Examination: Vital signs  in last 24 hours: Temp:  [98.5 F (36.9 C)] 98.5 F (36.9 C) (03/26 1959) Pulse Rate:  [111-116] 113 (03/27 0700) Resp:  [18-29] 21 (03/27 0700) BP: (127-147)/(52-90) 147/73 (03/27 0700) SpO2:  [90 %-97 %] 97 % (03/27 0700) Weight:  [178 lb (80.7 kg)] 178 lb (80.7 kg) (03/26 2000)    General: Well-nourished, well-developed in no acute distress.  Slightly hard of hearing. Head: Normocephalic, atraumatic.   Eyes: Conjunctiva pink, no icterus. Mouth: Oropharyngeal mucosa moist and pink , no lesions erythema or exudate. Neck: Supple without thyromegaly, masses, or lymphadenopathy.  Lungs: Clear to auscultation bilaterally.  Heart: Regular rate and rhythm, no murmurs rubs or gallops.  Abdomen: Bowel sounds are normal, nontender, slight distention no hepatosplenomegaly or masses, no abdominal bruits or    hernia , no rebound or guarding.   Rectal: Not performed Extremities: No lower extremity edema, clubbing, deformity.  Neuro: Alert and oriented x 4 , grossly normal neurologically.  Skin: Warm and dry, no rash or jaundice.   Psych: Alert and cooperative, normal mood and affect.        Intake/Output from previous day: 03/26 0701 - 03/27 0700 In: 1000 [IV Piggyback:1000] Out: -  Intake/Output this shift: No intake/output data recorded.  Lab Results: CBC Recent Labs    12/11/17 2012 12/12/17 0606  WBC 11.6* 8.5  HGB 12.2 11.5*  HCT 38.5 35.5*  MCV 93.2 93.7  PLT 283 269   BMET Recent Labs    12/11/17 2012 12/12/17 0606  NA 135 135  K 4.4 4.3  CL 99* 101  CO2 21* 22  GLUCOSE 114* 113*  BUN 10 10  CREATININE 0.90 0.81  CALCIUM 8.9 8.3*   LFT Recent Labs    12/11/17 2012 12/12/17 0606  BILITOT 1.3* 1.2  ALKPHOS 61 55  AST 24 24  ALT 18 19  PROT 7.0 6.2*  ALBUMIN 3.4* 2.9*    Lipase Recent Labs    12/11/17 2012  LIPASE 23    PT/INR No results for input(s): LABPROT, INR in the last 72 hours.    Imaging Studies: Dg Chest 2 View  Result Date:  12/08/2017 CLINICAL DATA:  Fever. EXAM: CHEST - 2 VIEW COMPARISON:  05/28/2015 FINDINGS: Heart size upper normal, likely normal for technique. Chronic hyperinflation and interstitial coarsening. Minimal patchy right suprahilar opacity. No pulmonary edema. No pleural effusion or pneumothorax. No acute osseous abnormalities. IMPRESSION: Minimal patchy right suprahilar opacities suspicious for pneumonia/pneumonitis in the setting of fever. Electronically Signed   By: Jeb Levering M.D.   On: 12/08/2017 06:23   Ct Abdomen Pelvis W Contrast  Result Date: 12/12/2017 CLINICAL DATA:  84 year old female with abdominal distention. EXAM: CT ABDOMEN AND PELVIS WITH CONTRAST TECHNIQUE: Multidetector CT imaging of the abdomen and pelvis was performed using the standard protocol following bolus administration of intravenous contrast. CONTRAST:  134mL ISOVUE-300 IOPAMIDOL (ISOVUE-300) INJECTION 61% COMPARISON:  Abdominal radiograph dated 12/11/2017 and CT dated 09/04/2016 FINDINGS: Lower chest: Small bilateral pleural effusions with associated mild partial compressive atelectasis of the lower lobes. Minimal right lung base atelectasis/scarring. Pneumonia is not entirely excluded. Clinical correlation is recommended. Partially visualized mechanical mitral valve. No intra-abdominal free air.  Trace free fluid within  the pelvis. Hepatobiliary: The liver is unremarkable. There is a 3 cm stone within the gallbladder. The gallbladder is mildly distended. No pericholecystic fluid or evidence of acute cholecystitis by CT. Pancreas: Unremarkable. No pancreatic ductal dilatation or surrounding inflammatory changes. Spleen: Normal in size without focal abnormality. Adrenals/Urinary Tract: Left adrenal thickening/hyperplasia. The right adrenal gland is unremarkable. There is bilateral extrarenal pelvis with mild pelviectasis. Multiple small bilateral renal hypodense lesions noted. The largest lesion in the upper pole of the left kidney  measures 4 cm consistent with cysts. The smaller lesions are too small to characterize. There is symmetric enhancement and excretion of contrast by both kidneys. The visualized ureters and urinary bladder appear unremarkable. Stomach/Bowel: There is extensive sigmoid diverticulosis without active inflammatory changes. Multiple mildly dilated and fluid-filled loops of small bowel noted in the mid to lower abdomen. There is thickened and mildly inflamed loops of small bowel in the lower abdomen and pelvis. Findings most consistent with enteritis with associated ileus. No definite transition zone identified to suggest obstruction. Loose stool within the colon compatible with diarrheal state. The appendix is normal. Vascular/Lymphatic: Moderate aortoiliac atherosclerotic disease. No portal venous gas. There is no adenopathy. Reproductive: The uterus and ovaries are grossly unremarkable as visualized. Other: None Musculoskeletal: Osteopenia with degenerative changes of the spine. Total right hip arthroplasty with associated streak artifact. Left hip arthritic changes. No acute osseous pathology. IMPRESSION: 1. Diarrheal state with findings of enteritis and associated mild ileus. Correlation with clinical exam and stool cultures recommended. No definite evidence of bowel obstruction. Normal appendix. 2. Extensive sigmoid diverticulosis without active inflammatory changes. 3. Cholelithiasis. 4.  Aortic Atherosclerosis (ICD10-I70.0). Electronically Signed   By: Anner Crete M.D.   On: 12/12/2017 00:57   Dg Abd Acute W/chest  Result Date: 12/11/2017 CLINICAL DATA:  Abdominal distension.  Chest pain. EXAM: DG ABDOMEN ACUTE W/ 1V CHEST COMPARISON:  12/08/2017 chest radiograph. 09/04/2016 CT abdomen/pelvis. 05/28/2015 abdominal radiographs. FINDINGS: Surgical hardware from ACDF is partially visualized overlying the lower cervical spine. Stable cardiomediastinal silhouette with normal heart size. No pneumothorax. No  pleural effusion. No pulmonary edema. No acute consolidative airspace disease. There are moderately dilated small bowel loops throughout the abdomen demonstrating air-fluid levels, measuring up to 5.4 cm diameter. There is mild gaseous distention of the large bowel with scattered air-fluid levels throughout the colon. No evidence of pneumatosis or pneumoperitoneum. No radiopaque nephrolithiasis. Partially visualized right hip hemiarthroplasty. IMPRESSION: 1. No active cardiopulmonary disease. 2. Diffuse moderate small bowel dilatation. Mild diffuse gaseous distention of the large bowel. Air-fluid levels throughout the small and large bowel. Findings are most suggestive of a diffuse adynamic ileus, although a distal small bowel obstruction cannot be excluded. Electronically Signed   By: Ilona Sorrel M.D.   On: 12/11/2017 19:40  [4 week]   Impression: 84 year old female with recent hospitalization for community-acquired pneumonia with possible sepsis who was discharged yesterday to a skilled nursing facility for rehabilitation.  She presented back to the hospital yesterday after abdominal distention noted by her family.  Abdominal films suggested diffuse moderate small bowel dilatation with mild diffuse gaseous distention of the large bowel, questionable ileus although distal small bowel obstruction cannot be excluded.  She presented to the ED and had CT scan ruling out obstruction.  Suggestion of enteritis with mild ileus.  Patient having multiple loose stools.  No further dry heaves although she had this persistently over the past week during her hospitalization.  It is noted that patient has ruq u/s scheduled. Will  follow up results as available.   Plan: 1. Await gi pathogen panel. Add on cdiff.  2. Supportive measures. 3. F/u pending ruq u/s. Consider clear liquids if u/s ok.   We would like to thank you for the opportunity to participate in the care of [facility].  Laureen Ochs. Bernarda Caffey [facility] [phone] 3/27/201910:11 AM    LOS: 0 days

## 2017-12-12 NOTE — H&P (Signed)
History and Physical  Denise Parrish QIO:[id number] DOB: [birth year] DOA: 12/11/2017   PCP: Lemmie Evens, MD   Patient coming from: Home  Chief Complaint: abdominal distension  HPI:  Denise Parrish is a 84 y.o. female with medical history of hypertension, hyperlipidemia, anxiety, diabetes mellitus type 2, GERD presenting with abdominal distention of one day duration.  The patient was recently discharged from the hospital after a stay from 12/09/1931 2619 during which she was treated for sepsis secondary to pneumonia.  The patient was discharged to the skilled nursing facility on 12/11/2017 with Augmentin for 6 additional days.  She was noted to have increasing abdominal distention at the nursing facility.  Abdominal x-rays showed diffuse small bowel dilatation with gaseous distention of the small and large bowel with air-fluid levels.  There was concern for ileus versus small bowel obstruction.  The patient was sent back to the emergency department for further evaluation.  The patient did not previously noted worsening abdominal distention, but now has mild diffuse abdominal pain.  She 12/10/2017, but no emesis since that time.  States that she had a dry heave on she has had one loose bowel movement on the morning of 12/12/2017.  There is no hematochezia, melena, dysuria, hematuria, fevers, chills.  There is no chest pain or shortness of breath.  During the last hospitalization, the patient stated that she did have loose stools for which she was treated with Imodium. In the emergency department, the patient was afebrile hemodynamically stable saturating 97% on 2 L.  CT of the abdomen and pelvis showed thickened and mildly inflamed small bowel loops in the lower abdomen/pelvis consistent with enteritis with associated ileus.  There was bibasilar atelectasis with right basilar scarring/atelectasis.  There are small bilateral pleural effusions.  Assessment/Plan: Enteritis/adynamic ileus -Check  stool pathogen panel -May be in part due to antimotility agents -GI consult -npo for now except ice chips -Convert essential medicines to IV -Start IV fluids -Continue PPI -Check TSH/free T4 -RUQ ultrasound--CT shows distended gallbladder with cholelithiasis -CT abd as discussed above  Pneumonia -Start ceftriaxone and azithromycin IV as the patient remains npo--she was previously started on 12/08/2017  Diabetes mellitus type 2 -12/08/2017 hemoglobin A1c 6.0 -Discontinue Amaryl -CBGs controlled during last hospitalization -will not start ISS  Essential hypertension -Start IV Lopressor while the patient remains npo  Anxiety -Continue home dose of alprazolam        Past Medical History:  Diagnosis Date  . Anxiety   . Aortic sclerosis    Long-standing heart murmur  . Chest pain 2006   +palpitations; minimal CAD at cath in 2005; normal EF; negative stress echo in 09/2008  . Cholelithiasis    on u/s 09/2011  . Degenerative joint disease    Right THA in 1995; cervical discectomy and fusion-2001  . Diabetes mellitus, type 2 (HCC)    + neuropathy  . Emphysema   . Gastroesophageal reflux disease   . Hiatal hernia   . Hyperlipidemia   . Hypertension   . IDA (iron deficiency anemia)    labs 09/2011  . Obesity   . Osteoporosis   . Sigmoid diverticulosis   . Small bowel lesion    On Given's capsule; Dr Kathleene Hazel 07/2012, no further w/u needed   Past Surgical History:  Procedure Laterality Date  . ANTERIOR CERVICAL DISCECTOMY  2001   C4-5, allograft, fixation  . BREAST LUMPECTOMY     benign  . CATARACT EXTRACTION W/ INTRAOCULAR LENS  IMPLANT  2007   Left  . COLONOSCOPY  2006  . COLONOSCOPY  04/03/12   Dr. Ok Edwards diverticulosis, negative microscopic  colitis   . ESOPHAGOGASTRODUODENOSCOPY  04/03/12   Dr. Jennet Maduro hernia, chronic gastritis on bx  . GIVENS CAPSULE STUDY  05/09/2012   RMR: an unclear raised area of small bowel was noted, with features  almost  characteristic of very small polyp. This was without villous  blunting or any evidence of active bleeding; yet, the area of  concern appeared to be erythematous. However, this could simply  be a light reflection on a normal variation of the small bowel. REFERRED TO DR. GILLIAM, APPT FOR NOVEMBER 2013.   . TONSILLECTOMY    . TOTAL HIP ARTHROPLASTY  1995   Right   Social History:  reports that she quit smoking about 47 years ago. Her smoking use included cigarettes. She has a 30.00 pack-year smoking history. She quit smokeless tobacco use about 46 years ago. She reports that she does not drink alcohol or use drugs.   Family History  Problem Relation Age of Onset  . Diabetes Mother        + brother  . Heart failure Father   . Hypertension Father        + brother  . Cancer Brother        lung, age 28  . Uterine cancer Sister   . Colon cancer Neg Hx      No Known Allergies   Prior to Admission medications   Medication Sig Start Date End Date Taking? Authorizing Provider  acetaminophen (TYLENOL) 325 MG tablet Take 325 mg by mouth every 6 (six) hours as needed for moderate pain or headache.   Yes [provider]  ALPRAZolam (XANAX) 0.5 MG tablet Take 1 tablet (0.5 mg total) by mouth 3 (three) times daily. 12/11/17  Yes Barton Dubois, MD  amoxicillin-clavulanate (AUGMENTIN) 875-125 MG tablet Take 1 tablet by mouth every 12 (twelve) hours for 6 days. 12/11/17 12/17/17 Yes Barton Dubois, MD  aspirin EC 81 MG tablet Take 81 mg by mouth daily.    Yes [provider]  Cholecalciferol (VITAMIN D-3 PO) Take 5,000 Units by mouth daily.    Yes [provider]  glimepiride (AMARYL) 1 MG tablet Take 1 mg by mouth daily.  09/17/17  Yes [provider]  ibuprofen (ADVIL,MOTRIN) 200 MG tablet Take 2 tablets (400 mg total) by mouth every 8 (eight) hours as needed for headache or moderate pain. 12/11/17  Yes Barton Dubois, MD  loperamide (LOPERAMIDE A-D) 2 MG  tablet Take 2 mg by mouth every 12 (twelve) hours as needed for diarrhea or loose stools.   Yes [provider]  magnesium oxide (MAG-OX) 400 MG tablet Take 800 mg by mouth daily.   Yes [provider]  methocarbamol (ROBAXIN) 500 MG tablet Take 1 tablet (500 mg total) by mouth every 8 (eight) hours as needed for muscle spasms. 12/11/17  Yes Barton Dubois, MD  metoprolol tartrate (LOPRESSOR) 25 MG tablet Take 1 tablet by mouth 2 (two) times daily. 07/27/16  Yes [provider]  omeprazole (PRILOSEC) 20 MG capsule Take 20 mg by mouth Daily. 08/05/12  Yes [provider]    Review of Systems:  Constitutional:  No weight loss, night sweats, Fevers, chills, fatigue.  Head&Eyes: No headache.  No vision loss.  No eye pain or scotoma ENT:  No Difficulty swallowing,Tooth/dental problems,Sore throat,  No ear ache, post nasal drip,  Cardio-vascular:  No chest pain, Orthopnea, PND, swelling in lower extremities,  dizziness, palpitations  GI:  No   nausea, vomiting, diarrhea, loss of appetite, hematochezia, melena, heartburn, indigestion, Resp:  No shortness of breath with exertion or at rest.  No coughing up of blood .No wheezing.No chest wall deformity  Skin:  no rash or lesions.  GU:  no dysuria, change in color of urine, no urgency or frequency. No flank pain.  Musculoskeletal:  No joint pain or swelling. No decreased range of motion. No back pain.  Psych:  No change in mood or affect. No depression or anxiety. Neurologic: No headache, no dysesthesia, no focal weakness, no vision loss. No syncope  Physical Exam: Vitals:   12/12/17 0400 12/12/17 0500 12/12/17 0544 12/12/17 0600  BP: (!) 135/52 127/60 (!) 146/90 (!) 147/79  Pulse: (!) 111 (!) 113 (!) 114 (!) 112  Resp: 18  (!) 23 (!) 29  Temp:      TempSrc:      SpO2: 95% 95% 97% 97%  Weight:      Height:       General:  A&O x 3, NAD, nontoxic, pleasant/cooperative Head/Eye: No conjunctival  hemorrhage, no icterus, [place]/AT, No nystagmus ENT:  No icterus,  No thrush, good dentition, no pharyngeal exudate Neck:  No masses, no lymphadenpathy, no bruits CV:  RRR, no rub, no gallop, no S3 Lung:   Diminished breath sounds at the bases., good air movement, no wheeze, no rhonchi Abdomen: soft/, +BS, mildly distended, no peritoneal signs; diffuse tenderness. Ext: No cyanosis, No rashes, No petechiae, No lymphangitis, trace LE edema Neuro: CNII-XII intact, strength 4/5 in bilateral upper and lower extremities, no dysmetria  Labs on Admission:  Basic Metabolic Panel: Recent Labs  Lab 12/08/17 0520 12/08/17 1858 12/09/17 0657 12/11/17 2012 12/12/17 0606  NA 140  --  141 135 135  K 4.2  --  3.7 4.4 4.3  CL 101  --  110 99* 101  CO2 25  --  24 21* 22  GLUCOSE 189*  --  125* 114* 113*  BUN 16  --  11 10 10   CREATININE 1.01*  --  0.90 0.90 0.81  CALCIUM 8.9  --  8.3* 8.9 8.3*  MG  --  1.9  --  2.0  --   PHOS  --  2.5  --   --   --    Liver Function Tests: Recent Labs  Lab 12/08/17 0520 12/11/17 2012 12/12/17 0606  AST 30 24 24   ALT 22 18 19   ALKPHOS 77 61 55  BILITOT 1.2 1.3* 1.2  PROT 7.3 7.0 6.2*  ALBUMIN 3.8 3.4* 2.9*   Recent Labs  Lab 12/11/17 2012  LIPASE 23   No results for input(s): AMMONIA in the last 168 hours. CBC: Recent Labs  Lab 12/08/17 0520 12/09/17 0657 12/11/17 2012 12/12/17 0606  WBC 13.0* 10.4 11.6* 8.5  NEUTROABS 11.1*  --   --  5.9  HGB 13.1 10.5* 12.2 11.5*  HCT 40.9 33.2* 38.5 35.5*  MCV 93.0 93.5 93.2 93.7  PLT 270 230 283 269   Coagulation Profile: No results for input(s): INR, PROTIME in the last 168 hours. Cardiac Enzymes: Recent Labs  Lab 12/08/17 0520  TROPONINI <0.03   BNP: Invalid input(s): POCBNP CBG: Recent Labs  Lab 12/10/17 1115 12/10/17 1610 12/10/17 2154 12/11/17 0813 12/11/17 1227  GLUCAP 126* 127* 111* 125* 112*   Urine analysis:    Component Value Date/Time   COLORURINE YELLOW 12/11/2017 2003  APPEARANCEUR CLEAR 12/11/2017 2003   LABSPEC 1.017 12/11/2017 2003   PHURINE 5.0 12/11/2017 2003   GLUCOSEU NEGATIVE 12/11/2017 2003   HGBUR NEGATIVE 12/11/2017 2003   BILIRUBINUR NEGATIVE 12/11/2017 2003   KETONESUR 80 (A) 12/11/2017 2003   PROTEINUR NEGATIVE 12/11/2017 2003   UROBILINOGEN 1.0 05/23/2015 1956   NITRITE NEGATIVE 12/11/2017 2003   LEUKOCYTESUR NEGATIVE 12/11/2017 2003   Sepsis Labs: @LABRCNTIP (procalcitonin:4,lacticidven:4) ) Recent Results (from the past 240 hour(s))  Blood culture (routine x 2)     Status: None (Preliminary result)   Collection Time: 12/08/17  5:20 AM  Result Value Ref Range Status   Specimen Description RIGHT ANTECUBITAL  Final   Special Requests   Final    BOTTLES DRAWN AEROBIC AND ANAEROBIC Blood Culture adequate volume   Culture  Setup Time   Final    NO ORGANISMS SEEN ANAEROBIC BOTTLE RELOADED, PLATED   Culture   Final    NO GROWTH 3 DAYS Performed at [facility], [address] [place], [place] [address]    Report Status PENDING  Incomplete  Blood culture (routine x 2)     Status: None (Preliminary result)   Collection Time: 12/08/17  6:04 AM  Result Value Ref Range Status   Specimen Description LEFT ANTECUBITAL  Final   Special Requests   Final    BOTTLES DRAWN AEROBIC AND ANAEROBIC Blood Culture adequate volume   Culture  Setup Time NO ORGANISMS SEEN AEROBIC BOTTLE RELOADED, PLATED  Final   Culture   Final    NO GROWTH 3 DAYS Performed at [facility], [address], [place], [place] [address]    Report Status PENDING  Incomplete  Urine Culture     Status: None   Collection Time: 12/08/17  6:17 AM  Result Value Ref Range Status   Specimen Description   Final    URINE, CATHETERIZED Performed at [facility], [address] [place], [place] [address]    Special Requests   Final    NONE Performed at [facility], [address] [place], [place] [address]    Culture   Final    NO GROWTH Performed at [facility],  [place] [address] [place], [place] [address]    Report Status 12/09/2017 FINAL  Final     Radiological Exams on Admission: Ct Abdomen Pelvis W Contrast  Result Date: 12/12/2017 CLINICAL DATA:  84 year old female with abdominal distention. EXAM: CT ABDOMEN AND PELVIS WITH CONTRAST TECHNIQUE: Multidetector CT imaging of the abdomen and pelvis was performed using the standard protocol following bolus administration of intravenous contrast. CONTRAST:  175mL ISOVUE-300 IOPAMIDOL (ISOVUE-300) INJECTION 61% COMPARISON:  Abdominal radiograph dated 12/11/2017 and CT dated 09/04/2016 FINDINGS: Lower chest: Small bilateral pleural effusions with associated mild partial compressive atelectasis of the lower lobes. Minimal right lung base atelectasis/scarring. Pneumonia is not entirely excluded. Clinical correlation is recommended. Partially visualized mechanical mitral valve. No intra-abdominal free air.  Trace free fluid within the pelvis. Hepatobiliary: The liver is unremarkable. There is a 3 cm stone within the gallbladder. The gallbladder is mildly distended. No pericholecystic fluid or evidence of acute cholecystitis by CT. Pancreas: Unremarkable. No pancreatic ductal dilatation or surrounding inflammatory changes. Spleen: Normal in size without focal abnormality. Adrenals/Urinary Tract: Left adrenal thickening/hyperplasia. The right adrenal gland is unremarkable. There is bilateral extrarenal pelvis with mild pelviectasis. Multiple small bilateral renal hypodense lesions noted. The largest lesion in the upper pole of the left kidney measures 4 cm consistent with cysts. The smaller lesions  are too small to characterize. There is symmetric enhancement and excretion of contrast by both kidneys. The visualized ureters and urinary bladder appear unremarkable. Stomach/Bowel: There is extensive sigmoid diverticulosis without active inflammatory changes. Multiple mildly dilated and fluid-filled loops of small bowel noted in the  mid to lower abdomen. There is thickened and mildly inflamed loops of small bowel in the lower abdomen and pelvis. Findings most consistent with enteritis with associated ileus. No definite transition zone identified to suggest obstruction. Loose stool within the colon compatible with diarrheal state. The appendix is normal. Vascular/Lymphatic: Moderate aortoiliac atherosclerotic disease. No portal venous gas. There is no adenopathy. Reproductive: The uterus and ovaries are grossly unremarkable as visualized. Other: None Musculoskeletal: Osteopenia with degenerative changes of the spine. Total right hip arthroplasty with associated streak artifact. Left hip arthritic changes. No acute osseous pathology. IMPRESSION: 1. Diarrheal state with findings of enteritis and associated mild ileus. Correlation with clinical exam and stool cultures recommended. No definite evidence of bowel obstruction. Normal appendix. 2. Extensive sigmoid diverticulosis without active inflammatory changes. 3. Cholelithiasis. 4.  Aortic Atherosclerosis (ICD10-I70.0). Electronically Signed   By: Anner Crete M.D.   On: 12/12/2017 00:57   Dg Abd Acute W/chest  Result Date: 12/11/2017 CLINICAL DATA:  Abdominal distension.  Chest pain. EXAM: DG ABDOMEN ACUTE W/ 1V CHEST COMPARISON:  12/08/2017 chest radiograph. 09/04/2016 CT abdomen/pelvis. 05/28/2015 abdominal radiographs. FINDINGS: Surgical hardware from ACDF is partially visualized overlying the lower cervical spine. Stable cardiomediastinal silhouette with normal heart size. No pneumothorax. No pleural effusion. No pulmonary edema. No acute consolidative airspace disease. There are moderately dilated small bowel loops throughout the abdomen demonstrating air-fluid levels, measuring up to 5.4 cm diameter. There is mild gaseous distention of the large bowel with scattered air-fluid levels throughout the colon. No evidence of pneumatosis or pneumoperitoneum. No radiopaque nephrolithiasis.  Partially visualized right hip hemiarthroplasty. IMPRESSION: 1. No active cardiopulmonary disease. 2. Diffuse moderate small bowel dilatation. Mild diffuse gaseous distention of the large bowel. Air-fluid levels throughout the small and large bowel. Findings are most suggestive of a diffuse adynamic ileus, although a distal small bowel obstruction cannot be excluded. Electronically Signed   By: Ilona Sorrel M.D.   On: 12/11/2017 19:40        Time spent:60 minutes Code Status:   FULL Family Communication:  No Family at bedside Disposition Plan: expect 2-3 day hospitalization Consults called: GI DVT Prophylaxis: [place] Lovenox  Orson Eva, DO  [facility] Pager [phone]  If 7PM-7AM, please contact night-coverage [email or web address] Password [facility] 12/12/2017, 7:43 AM

## 2017-12-12 NOTE — ED Notes (Signed)
Have ambulated pt to bathroom. Have also notified family of the need to keep hands clean

## 2017-12-13 DIAGNOSIS — E860 Dehydration: Secondary | ICD-10-CM | POA: Diagnosis not present

## 2017-12-13 DIAGNOSIS — K567 Ileus, unspecified: Secondary | ICD-10-CM | POA: Diagnosis not present

## 2017-12-13 DIAGNOSIS — K529 Noninfective gastroenteritis and colitis, unspecified: Secondary | ICD-10-CM | POA: Diagnosis not present

## 2017-12-13 LAB — BASIC METABOLIC PANEL
Anion gap: 11 (ref 5–15)
BUN: 12 mg/dL (ref 6–20)
CO2: 23 mmol/L (ref 22–32)
Calcium: 8.7 mg/dL — ABNORMAL LOW (ref 8.9–10.3)
Chloride: 103 mmol/L (ref 101–111)
Creatinine, Ser: 0.91 mg/dL (ref 0.44–1.00)
GFR calc Af Amer: >60 mL/min (ref >60)
GFR calc non Af Amer: 53 mL/min — ABNORMAL LOW (ref >60)
Glucose, Bld: 114 mg/dL — ABNORMAL HIGH (ref 65–99)
Potassium: 4.4 mmol/L (ref 3.5–5.1)
Sodium: 137 mmol/L (ref 135–145)

## 2017-12-13 LAB — CBC
HCT: 35.2 % — ABNORMAL LOW (ref 36.0–46.0)
Hemoglobin: 11.4 g/dL — ABNORMAL LOW (ref 12.0–15.0)
MCH: 29.9 pg (ref 26.0–34.0)
MCHC: 32.4 g/dL (ref 30.0–36.0)
MCV: 92.4 fL (ref 78.0–100.0)
Platelets: 300 10*3/uL (ref 150–400)
RBC: 3.81 MIL/uL — ABNORMAL LOW (ref 3.87–5.11)
RDW: 13.6 % (ref 11.5–15.5)
WBC: 7.2 10*3/uL (ref 4.0–10.5)

## 2017-12-13 LAB — CULTURE, BLOOD (ROUTINE X 2)
Culture  Setup Time: NONE SEEN
Culture  Setup Time: NONE SEEN
Culture: NO GROWTH
Culture: NO GROWTH
Special Requests: ADEQUATE
Special Requests: ADEQUATE

## 2017-12-13 LAB — GASTROINTESTINAL PANEL BY PCR, STOOL (REPLACES STOOL CULTURE)

## 2017-12-13 LAB — MRSA PCR SCREENING: MRSA by PCR: NEGATIVE

## 2017-12-13 LAB — MAGNESIUM: Magnesium: 1.9 mg/dL (ref 1.7–2.4)

## 2017-12-13 MED ORDER — METOPROLOL TARTRATE 25 MG PO TABS
25.0000 mg | ORAL_TABLET | Freq: Two times a day (BID) | ORAL | Status: DC
Start: 2017-12-13 — End: 2017-12-14
  Administered 2017-12-13 – 2017-12-14 (×2): 25 mg via ORAL
  Filled 2017-12-13 (×2): qty 1

## 2017-12-13 MED ORDER — PANTOPRAZOLE SODIUM 40 MG PO TBEC
40.0000 mg | DELAYED_RELEASE_TABLET | Freq: Every day | ORAL | Status: DC
  Administered 2017-12-14: 40 mg via ORAL
  Filled 2017-12-13: qty 1

## 2017-12-13 MED ORDER — AZITHROMYCIN 250 MG PO TABS
500.0000 mg | ORAL_TABLET | Freq: Every day | ORAL | Status: DC
  Administered 2017-12-14: 500 mg via ORAL
  Filled 2017-12-13: qty 2

## 2017-12-13 MED ORDER — METOPROLOL TARTRATE 5 MG/5ML IV SOLN
2.5000 mg | Freq: Four times a day (QID) | INTRAVENOUS | Status: AC
  Administered 2017-12-13: 2.5 mg via INTRAVENOUS
  Filled 2017-12-13: qty 5

## 2017-12-13 NOTE — Care Management Obs Status (Signed)
[place] NOTIFICATION   Patient Details  Name: MELODYE SWOR MRN: [id number] Date of Birth: 06-15-1923   Medicare Observation Status Notification Given:  Yes    Sherald Barge, RN 12/13/2017, 3:53 PM

## 2017-12-13 NOTE — Care Management CC44 (Signed)
Condition Code 44 Documentation Completed  Patient Details  Name: BRITLEE SKOLNIK MRN: [id number] Date of Birth: May 08, 1923   Condition Code 44 given:  Yes Patient signature on Condition Code 44 notice:  Yes Documentation of 2 MD's agreement:  Yes Code 44 added to claim:  Yes    Sherald Barge, RN 12/13/2017, 3:53 PM

## 2017-12-13 NOTE — Progress Notes (Signed)
PROGRESS NOTE  Denise Parrish XVQ:[id number] DOB: 1923-05-10 DOA: 12/11/2017 PCP: Lemmie Evens, MD  Brief History:  84 y.o. female with medical history of hypertension, hyperlipidemia, anxiety, diabetes mellitus type 2, GERD presenting with abdominal distention of one day duration.  The patient was recently discharged from the hospital after a stay from 12/08/17 to 12/11/17 during which she was treated for sepsis secondary to pneumonia.  The patient was discharged to the skilled nursing facility on 12/11/2017 with Augmentin for 6 additional days.  She was noted to have increasing abdominal distention at the nursing facility.  Abdominal x-rays showed diffuse small bowel dilatation with gaseous distention of the small and large bowel with air-fluid levels.  There was concern for ileus versus small bowel obstruction.  The patient was sent back to the emergency department for further evaluation.  The patient did not previously noted worsening abdominal distention, but now has mild diffuse abdominal pain. States that she had a dry heave and she has had one loose bowel movement on the morning of 12/12/2017.  CT of the abdomen and pelvis showed thickened and mildly inflamed small bowel loops in the lower abdomen/pelvis consistent with enteritis with associated ileus.  There was bibasilar atelectasis with right basilar scarring/atelectasis.  There are small bilateral pleural effusions.   Assessment/Plan: Enteritis/adynamic ileus -Check stool pathogen panel--neg -Cdiff neg -May be in part due to acute illness/ischemia and antimotility agents she had received  -GI consult appreciated--DDx infx vs crohns more likely than ischemia or neoplasm; conservative tx as pt is improving -diet advanced to dys 3 which pt is tolerating -Convert essential medicines to IV -ContinueIV fluids -Continue PPI -Check TSH--3.149; Free T4--1.09 -RUQ ultrasound--cholelithiasis without cholecystitis -CT abd as  discussed above  Pneumonia -Started ceftriaxone and azithromycin IV as the patient remains npo--she was previously started on 12/08/2017 -stop abx after 12/14/17 doses which would be 1 week  Diabetes mellitus type 2 -12/08/2017 hemoglobin A1c 6.0 -Discontinue Amaryl -CBGs controlled during last hospitalization -will not start ISS  Essential hypertension -Started IV Lopressor while the patient remained npo -restart po metoprolol now she is tolerating po  Anxiety -Continue home dose of alprazolam    Disposition Plan:   SNF 3/29 if stable  Family Communication:   Daughter updated at bedside 3/29  Consultants:  GI  Code Status:  FULL  DVT Prophylaxis:  [place] Lovenox   Procedures: As Listed in Progress Note Above  Antibiotics: None    Subjective: Patient is feeling better.  She is tolerating a soft diet presently.  She is having flatus.  She had 2 bowel movements in last 24 hours.  She denies any chest pain, shortness breath, vomiting.  Her abdominal pain is improving and it is less distended.  There is no dysuria hematuria.  She denies any fevers, chills, headache, neck pain, chest pain, shortness breath.  She has an occasional nonproductive cough.  Objective: Vitals:   12/13/17 0627 12/13/17 1242 12/13/17 1248 12/13/17 1300  BP: (!) 135/55 (!) 147/66  (!) 128/51  Pulse: 100   89  Resp: 15  15 17   Temp: 98 F (36.7 C) 98.6 F (37 C)  98.1 F (36.7 C)  TempSrc: Oral   Oral  SpO2: 98%  99% 99%  Weight:      Height:        Intake/Output Summary (Last 24 hours) at 12/13/2017 1740 Last data filed at 12/13/2017 0500 Gross per 24 hour  Intake 1541.25  ml  Output -  Net 1541.25 ml   Weight change: -1.814 kg (-4 lb) Exam:   General:  Pt is alert, follows commands appropriately, not in acute distress  HEENT: No icterus, No thrush, No neck mass, [place]/AT  Cardiovascular: RRR, S1/S2, no rubs, no gallops  Respiratory: CTA bilaterally, no wheezing, no crackles, no  rhonchi  Abdomen: Soft/+BS, non tender, non distended, no guarding  Extremities: No edema, No lymphangitis, No petechiae, No rashes, no synovitis   Data Reviewed: I have personally reviewed following labs and imaging studies Basic Metabolic Panel: Recent Labs  Lab 12/08/17 0520 12/08/17 1858 12/09/17 0657 12/11/17 2012 12/12/17 0606 12/13/17 0841  NA 140  --  141 135 135 137  K 4.2  --  3.7 4.4 4.3 4.4  CL 101  --  110 99* 101 103  CO2 25  --  24 21* 22 23  GLUCOSE 189*  --  125* 114* 113* 114*  BUN 16  --  11 10 10 12   CREATININE 1.01*  --  0.90 0.90 0.81 0.91  CALCIUM 8.9  --  8.3* 8.9 8.3* 8.7*  MG  --  1.9  --  2.0  --  1.9  PHOS  --  2.5  --   --   --   --    Liver Function Tests: Recent Labs  Lab 12/08/17 0520 12/11/17 2012 12/12/17 0606  AST 30 24 24   ALT 22 18 19   ALKPHOS 77 61 55  BILITOT 1.2 1.3* 1.2  PROT 7.3 7.0 6.2*  ALBUMIN 3.8 3.4* 2.9*   Recent Labs  Lab 12/11/17 2012  LIPASE 23   No results for input(s): AMMONIA in the last 168 hours. Coagulation Profile: No results for input(s): INR, PROTIME in the last 168 hours. CBC: Recent Labs  Lab 12/08/17 0520 12/09/17 0657 12/11/17 2012 12/12/17 0606 12/13/17 0841  WBC 13.0* 10.4 11.6* 8.5 7.2  NEUTROABS 11.1*  --   --  5.9  --   HGB 13.1 10.5* 12.2 11.5* 11.4*  HCT 40.9 33.2* 38.5 35.5* 35.2*  MCV 93.0 93.5 93.2 93.7 92.4  PLT 270 230 283 269 300   Cardiac Enzymes: Recent Labs  Lab 12/08/17 0520  TROPONINI <0.03   BNP: Invalid input(s): POCBNP CBG: Recent Labs  Lab 12/10/17 1115 12/10/17 1610 12/10/17 2154 12/11/17 0813 12/11/17 1227  GLUCAP 126* 127* 111* 125* 112*   HbA1C: No results for input(s): HGBA1C in the last 72 hours. Urine analysis:    Component Value Date/Time   COLORURINE YELLOW 12/11/2017 2003   APPEARANCEUR CLEAR 12/11/2017 2003   LABSPEC 1.017 12/11/2017 2003   PHURINE 5.0 12/11/2017 2003   GLUCOSEU NEGATIVE 12/11/2017 2003   HGBUR NEGATIVE  12/11/2017 2003   E. Lopez NEGATIVE 12/11/2017 2003   KETONESUR 80 (A) 12/11/2017 2003   PROTEINUR NEGATIVE 12/11/2017 2003   UROBILINOGEN 1.0 05/23/2015 1956   NITRITE NEGATIVE 12/11/2017 2003   LEUKOCYTESUR NEGATIVE 12/11/2017 2003   Sepsis Labs: @LABRCNTIP (procalcitonin:4,lacticidven:4) ) Recent Results (from the past 240 hour(s))  Blood culture (routine x 2)     Status: None   Collection Time: 12/08/17  5:20 AM  Result Value Ref Range Status   Specimen Description RIGHT ANTECUBITAL  Final   Special Requests   Final    BOTTLES DRAWN AEROBIC AND ANAEROBIC Blood Culture adequate volume   Culture  Setup Time   Final    NO ORGANISMS SEEN ANAEROBIC BOTTLE RELOADED, PLATED   Culture   Final    NO  GROWTH 5 DAYS Performed at [facility], [address], [place], [place] [address]    Report Status 12/13/2017 FINAL  Final  Blood culture (routine x 2)     Status: None   Collection Time: 12/08/17  6:04 AM  Result Value Ref Range Status   Specimen Description LEFT ANTECUBITAL  Final   Special Requests   Final    BOTTLES DRAWN AEROBIC AND ANAEROBIC Blood Culture adequate volume   Culture  Setup Time NO ORGANISMS SEEN AEROBIC BOTTLE RELOADED, PLATED  Final   Culture   Final    NO GROWTH 5 DAYS Performed at [facility], [address] [place], [place] [address]    Report Status 12/13/2017 FINAL  Final  Urine Culture     Status: None   Collection Time: 12/08/17  6:17 AM  Result Value Ref Range Status   Specimen Description   Final    URINE, CATHETERIZED Performed at [facility], [address], [place], [place] [address]    Special Requests   Final    NONE Performed at [facility], [address] [place], [place] [address]    Culture   Final    NO GROWTH Performed at [facility], [place] [address], [place], [place] [address]    Report Status 12/09/2017 FINAL  Final  Gastrointestinal Panel by PCR , Stool     Status: None   Collection Time: 12/12/17  9:02 AM  Result  Value Ref Range Status   Campylobacter species NOT DETECTED NOT DETECTED Final   Plesimonas shigelloides NOT DETECTED NOT DETECTED Final   Salmonella species NOT DETECTED NOT DETECTED Final   Yersinia enterocolitica NOT DETECTED NOT DETECTED Final   Vibrio species NOT DETECTED NOT DETECTED Final   Vibrio cholerae NOT DETECTED NOT DETECTED Final   Enteroaggregative E coli (EAEC) NOT DETECTED NOT DETECTED Final   Enteropathogenic E coli (EPEC) NOT DETECTED NOT DETECTED Final   Enterotoxigenic E coli (ETEC) NOT DETECTED NOT DETECTED Final   Shiga like toxin producing E coli (STEC) NOT DETECTED NOT DETECTED Final   Shigella/Enteroinvasive E coli (EIEC) NOT DETECTED NOT DETECTED Final   Cryptosporidium NOT DETECTED NOT DETECTED Final   Cyclospora cayetanensis NOT DETECTED NOT DETECTED Final   Entamoeba histolytica NOT DETECTED NOT DETECTED Final   Giardia lamblia NOT DETECTED NOT DETECTED Final   Adenovirus F40/41 NOT DETECTED NOT DETECTED Final   Astrovirus NOT DETECTED NOT DETECTED Final   Norovirus GI/GII NOT DETECTED NOT DETECTED Final   Rotavirus A NOT DETECTED NOT DETECTED Final   Sapovirus (I, II, IV, and V) NOT DETECTED NOT DETECTED Final    Comment: Performed at [facility], [address] [place], [place] [address]  C difficile quick scan w PCR reflex     Status: None   Collection Time: 12/12/17  9:02 AM  Result Value Ref Range Status   C Diff antigen NEGATIVE NEGATIVE Final   C Diff toxin NEGATIVE NEGATIVE Final   C Diff interpretation No C. difficile detected.  Final    Comment: Performed at [facility], [address] [place], [place] [address]  MRSA PCR Screening     Status: None   Collection Time: 12/12/17  6:57 PM  Result Value Ref Range Status   MRSA by PCR NEGATIVE NEGATIVE Final    Comment:        The GeneXpert MRSA Assay (FDA approved for NASAL specimens only), is one component of a comprehensive MRSA colonization surveillance program. It  is  not intended to diagnose MRSA infection nor to guide or monitor treatment for MRSA infections. Performed at [facility], [address] [place], [place] [address]      Scheduled Meds: . enoxaparin (LOVENOX) injection  40 mg Subcutaneous Q24H  . metoprolol tartrate  2.5 mg Intravenous Q6H  . pantoprazole (PROTONIX) IV  40 mg Intravenous Q24H   Continuous Infusions: . sodium chloride Stopped (12/12/17 1004)  . 0.9 % NaCl with KCl 20 mEq / L 75 mL/hr at 12/13/17 0500  . azithromycin Stopped (12/13/17 1226)  . cefTRIAXone (ROCEPHIN)  IV Stopped (12/13/17 6568)    Procedures/Studies: Dg Chest 2 View  Result Date: 12/08/2017 CLINICAL DATA:  Fever. EXAM: CHEST - 2 VIEW COMPARISON:  05/28/2015 FINDINGS: Heart size upper normal, likely normal for technique. Chronic hyperinflation and interstitial coarsening. Minimal patchy right suprahilar opacity. No pulmonary edema. No pleural effusion or pneumothorax. No acute osseous abnormalities. IMPRESSION: Minimal patchy right suprahilar opacities suspicious for pneumonia/pneumonitis in the setting of fever. Electronically Signed   By: Jeb Levering M.D.   On: 12/08/2017 06:23   Ct Abdomen Pelvis W Contrast  Result Date: 12/12/2017 CLINICAL DATA:  84 year old female with abdominal distention. EXAM: CT ABDOMEN AND PELVIS WITH CONTRAST TECHNIQUE: Multidetector CT imaging of the abdomen and pelvis was performed using the standard protocol following bolus administration of intravenous contrast. CONTRAST:  176mL ISOVUE-300 IOPAMIDOL (ISOVUE-300) INJECTION 61% COMPARISON:  Abdominal radiograph dated 12/11/2017 and CT dated 09/04/2016 FINDINGS: Lower chest: Small bilateral pleural effusions with associated mild partial compressive atelectasis of the lower lobes. Minimal right lung base atelectasis/scarring. Pneumonia is not entirely excluded. Clinical correlation is recommended. Partially visualized mechanical mitral valve. No intra-abdominal free air.  Trace  free fluid within the pelvis. Hepatobiliary: The liver is unremarkable. There is a 3 cm stone within the gallbladder. The gallbladder is mildly distended. No pericholecystic fluid or evidence of acute cholecystitis by CT. Pancreas: Unremarkable. No pancreatic ductal dilatation or surrounding inflammatory changes. Spleen: Normal in size without focal abnormality. Adrenals/Urinary Tract: Left adrenal thickening/hyperplasia. The right adrenal gland is unremarkable. There is bilateral extrarenal pelvis with mild pelviectasis. Multiple small bilateral renal hypodense lesions noted. The largest lesion in the upper pole of the left kidney measures 4 cm consistent with cysts. The smaller lesions are too small to characterize. There is symmetric enhancement and excretion of contrast by both kidneys. The visualized ureters and urinary bladder appear unremarkable. Stomach/Bowel: There is extensive sigmoid diverticulosis without active inflammatory changes. Multiple mildly dilated and fluid-filled loops of small bowel noted in the mid to lower abdomen. There is thickened and mildly inflamed loops of small bowel in the lower abdomen and pelvis. Findings most consistent with enteritis with associated ileus. No definite transition zone identified to suggest obstruction. Loose stool within the colon compatible with diarrheal state. The appendix is normal. Vascular/Lymphatic: Moderate aortoiliac atherosclerotic disease. No portal venous gas. There is no adenopathy. Reproductive: The uterus and ovaries are grossly unremarkable as visualized. Other: None Musculoskeletal: Osteopenia with degenerative changes of the spine. Total right hip arthroplasty with associated streak artifact. Left hip arthritic changes. No acute osseous pathology. IMPRESSION: 1. Diarrheal state with findings of enteritis and associated mild ileus. Correlation with clinical exam and stool cultures recommended. No definite evidence of bowel obstruction. Normal  appendix. 2. Extensive sigmoid diverticulosis without active inflammatory changes. 3. Cholelithiasis. 4.  Aortic Atherosclerosis (ICD10-I70.0). Electronically Signed   By: Anner Crete M.D.   On: 12/12/2017 00:57   Dg Abd Acute W/chest  Result Date: 12/11/2017 CLINICAL DATA:  Abdominal distension.  Chest pain. EXAM: DG ABDOMEN ACUTE W/ 1V CHEST COMPARISON:  12/08/2017 chest radiograph. 09/04/2016 CT abdomen/pelvis. 05/28/2015 abdominal radiographs. FINDINGS: Surgical hardware from ACDF is partially visualized overlying the lower cervical spine. Stable cardiomediastinal silhouette with normal heart size. No pneumothorax. No pleural effusion. No pulmonary edema. No acute consolidative airspace disease. There are moderately dilated small bowel loops throughout the abdomen demonstrating air-fluid levels, measuring up to 5.4 cm diameter. There is mild gaseous distention of the large bowel with scattered air-fluid levels throughout the colon. No evidence of pneumatosis or pneumoperitoneum. No radiopaque nephrolithiasis. Partially visualized right hip hemiarthroplasty. IMPRESSION: 1. No active cardiopulmonary disease. 2. Diffuse moderate small bowel dilatation. Mild diffuse gaseous distention of the large bowel. Air-fluid levels throughout the small and large bowel. Findings are most suggestive of a diffuse adynamic ileus, although a distal small bowel obstruction cannot be excluded. Electronically Signed   By: Ilona Sorrel M.D.   On: 12/11/2017 19:40   US Abdomen Limited Ruq  Result Date: 12/12/2017 CLINICAL DATA:  Right upper quadrant pain EXAM: ULTRASOUND ABDOMEN LIMITED RIGHT UPPER QUADRANT COMPARISON:  CT abdomen and pelvis December 12, 2017 FINDINGS: Gallbladder: Within the gallbladder, there is a 3.6 cm in size echogenic focus which moves and shadows consistent with cholelithiasis. There is also sludge within the gallbladder. No gallbladder wall thickening or pericholecystic fluid. No sonographic Murphy  sign noted by sonographer. Common bile duct: Diameter: 7 mm, upper normal. No biliary duct mass or calculus evident. Liver: No focal lesion identified. Within normal limits in parenchymal echogenicity. Portal vein is patent on color Doppler imaging with normal direction of blood flow towards the liver. IMPRESSION: Cholelithiasis and sludge in gallbladder. No appreciable gallbladder wall thickening or pericholecystic fluid. Common bile duct upper normal in size. No liver lesions evident. Electronically Signed   By: Lowella Grip III M.D.   On: 12/12/2017 10:16    Orson Eva, DO  [facility] Pager [phone]  If 7PM-7AM, please contact night-coverage [email or web address] Password [email or web address] 12/13/2017, 5:40 PM   LOS: 1 day

## 2017-12-13 NOTE — Progress Notes (Signed)
Subjective:  Patient feeling some better. Abdomen less distended. Passing liquid stools. Last one was "black as tar". Did not show nurse. She had stool while I was present that she also called black but notes not as black as last one. Upon my exam it was liquid dark brown. No evidence of melena or brbpr.   Objective: Vital signs in last 24 hours: Temp:  [97.5 F (36.4 C)-98 F (36.7 C)] 98 F (36.7 C) (03/28 0627) Pulse Rate:  [98-103] 100 (03/28 0627) Resp:  [13-28] 15 (03/28 0627) BP: (111-148)/(55-77) 135/55 (03/28 0627) SpO2:  [94 %-99 %] 98 % (03/28 0627) Weight:  [174 lb (78.9 kg)] 174 lb (78.9 kg) (03/27 1822) Last BM Date: 12/12/17 General:   Alert,  Well-developed, well-nourished, pleasant and cooperative in NAD Head:  Normocephalic and atraumatic. Eyes:  Sclera clear, no icterus.  Abdomen:  Soft, nontender and slightly distended.  Normal bowel sounds, without guarding, and without rebound.   Extremities:  Without clubbing, deformity or edema. Neurologic:  Alert and  oriented x4;  grossly normal neurologically. Skin:  Intact without significant lesions or rashes. Psych:  Alert and cooperative. Normal mood and affect.  Intake/Output from previous day: 03/27 0701 - 03/28 0700 In: 2357.5 [P.O.:120; I.V.:1887.5; IV Piggyback:350] Out: -  Intake/Output this shift: No intake/output data recorded.  Lab Results: CBC Recent Labs    12/11/17 2012 12/12/17 0606  WBC 11.6* 8.5  HGB 12.2 11.5*  HCT 38.5 35.5*  MCV 93.2 93.7  PLT 283 269   BMET Recent Labs    12/11/17 2012 12/12/17 0606  NA 135 135  K 4.4 4.3  CL 99* 101  CO2 21* 22  GLUCOSE 114* 113*  BUN 10 10  CREATININE 0.90 0.81  CALCIUM 8.9 8.3*   LFTs Recent Labs    12/11/17 2012 12/12/17 0606  BILITOT 1.3* 1.2  ALKPHOS 61 55  AST 24 24  ALT 18 19  PROT 7.0 6.2*  ALBUMIN 3.4* 2.9*   Recent Labs    12/11/17 2012  LIPASE 23   PT/INR No results for input(s): LABPROT, INR in the last 72  hours.    Imaging Studies: Dg Chest 2 View  Result Date: 12/08/2017 CLINICAL DATA:  Fever. EXAM: CHEST - 2 VIEW COMPARISON:  05/28/2015 FINDINGS: Heart size upper normal, likely normal for technique. Chronic hyperinflation and interstitial coarsening. Minimal patchy right suprahilar opacity. No pulmonary edema. No pleural effusion or pneumothorax. No acute osseous abnormalities. IMPRESSION: Minimal patchy right suprahilar opacities suspicious for pneumonia/pneumonitis in the setting of fever. Electronically Signed   By: Jeb Levering M.D.   On: 12/08/2017 06:23   Ct Abdomen Pelvis W Contrast  Result Date: 12/12/2017 CLINICAL DATA:  84 year old female with abdominal distention. EXAM: CT ABDOMEN AND PELVIS WITH CONTRAST TECHNIQUE: Multidetector CT imaging of the abdomen and pelvis was performed using the standard protocol following bolus administration of intravenous contrast. CONTRAST:  153mL ISOVUE-300 IOPAMIDOL (ISOVUE-300) INJECTION 61% COMPARISON:  Abdominal radiograph dated 12/11/2017 and CT dated 09/04/2016 FINDINGS: Lower chest: Small bilateral pleural effusions with associated mild partial compressive atelectasis of the lower lobes. Minimal right lung base atelectasis/scarring. Pneumonia is not entirely excluded. Clinical correlation is recommended. Partially visualized mechanical mitral valve. No intra-abdominal free air.  Trace free fluid within the pelvis. Hepatobiliary: The liver is unremarkable. There is a 3 cm stone within the gallbladder. The gallbladder is mildly distended. No pericholecystic fluid or evidence of acute cholecystitis by CT. Pancreas: Unremarkable. No pancreatic ductal dilatation or surrounding inflammatory  changes. Spleen: Normal in size without focal abnormality. Adrenals/Urinary Tract: Left adrenal thickening/hyperplasia. The right adrenal gland is unremarkable. There is bilateral extrarenal pelvis with mild pelviectasis. Multiple small bilateral renal hypodense lesions  noted. The largest lesion in the upper pole of the left kidney measures 4 cm consistent with cysts. The smaller lesions are too small to characterize. There is symmetric enhancement and excretion of contrast by both kidneys. The visualized ureters and urinary bladder appear unremarkable. Stomach/Bowel: There is extensive sigmoid diverticulosis without active inflammatory changes. Multiple mildly dilated and fluid-filled loops of small bowel noted in the mid to lower abdomen. There is thickened and mildly inflamed loops of small bowel in the lower abdomen and pelvis. Findings most consistent with enteritis with associated ileus. No definite transition zone identified to suggest obstruction. Loose stool within the colon compatible with diarrheal state. The appendix is normal. Vascular/Lymphatic: Moderate aortoiliac atherosclerotic disease. No portal venous gas. There is no adenopathy. Reproductive: The uterus and ovaries are grossly unremarkable as visualized. Other: None Musculoskeletal: Osteopenia with degenerative changes of the spine. Total right hip arthroplasty with associated streak artifact. Left hip arthritic changes. No acute osseous pathology. IMPRESSION: 1. Diarrheal state with findings of enteritis and associated mild ileus. Correlation with clinical exam and stool cultures recommended. No definite evidence of bowel obstruction. Normal appendix. 2. Extensive sigmoid diverticulosis without active inflammatory changes. 3. Cholelithiasis. 4.  Aortic Atherosclerosis (ICD10-I70.0). Electronically Signed   By: Anner Crete M.D.   On: 12/12/2017 00:57   Dg Abd Acute W/chest  Result Date: 12/11/2017 CLINICAL DATA:  Abdominal distension.  Chest pain. EXAM: DG ABDOMEN ACUTE W/ 1V CHEST COMPARISON:  12/08/2017 chest radiograph. 09/04/2016 CT abdomen/pelvis. 05/28/2015 abdominal radiographs. FINDINGS: Surgical hardware from [facility]CD[facility] is partially visualized overlying the lower cervical spine. Stable  cardiomediastinal silhouette with normal heart size. No pneumothorax. No pleural effusion. No pulmonary edema. No acute consolidative airspace disease. There are moderately dilated small bowel loops throughout the abdomen demonstrating air-fluid levels, measuring up to 5.4 cm diameter. There is mild gaseous distention of the large bowel with scattered air-fluid levels throughout the colon. No evidence of pneumatosis or pneumoperitoneum. No radiopaque nephrolithiasis. Partially visualized right hip hemiarthroplasty. IMPRESSION: 1. No active cardiopulmonary disease. 2. Diffuse moderate small bowel dilatation. Mild diffuse gaseous distention of the large bowel. Air-fluid levels throughout the small and large bowel. Findings are most suggestive of a diffuse adynamic ileus, although a distal small bowel obstruction cannot be excluded. Electronically Signed   By: Ilona Sorrel M.D.   On: 12/11/2017 19:40   US Abdomen Limited Ruq  Result Date: 12/12/2017 CLINICAL DATA:  Right upper quadrant pain EXAM: ULTRASOUND ABDOMEN LIMITED RIGHT UPPER QUADRANT COMPARISON:  CT abdomen and pelvis December 12, 2017 FINDINGS: Gallbladder: Within the gallbladder, there is a 3.6 cm in size echogenic focus which moves and shadows consistent with cholelithiasis. There is also sludge within the gallbladder. No gallbladder wall thickening or pericholecystic fluid. No sonographic Murphy sign noted by sonographer. Common bile duct: Diameter: 7 mm, upper normal. No biliary duct mass or calculus evident. Liver: No focal lesion identified. Within normal limits in parenchymal echogenicity. Portal vein is patent on color Doppler imaging with normal direction of blood flow towards the liver. IMPRESSION: Cholelithiasis and sludge in gallbladder. No appreciable gallbladder wall thickening or pericholecystic fluid. Common bile duct upper normal in size. No liver lesions evident. Electronically Signed   By: Lowella Grip III M.D.   On: 12/12/2017 10:16   [2 weeks]   Assessment:  84 year old female with recent hospitalization for community-acquired pneumonia with possible sepsis who was discharged yesterday to a skilled nursing facility for rehabilitation.  She presented back to the hospital yesterday after abdominal distention noted by her family.  Abdominal films suggested diffuse moderate small bowel dilatation with mild diffuse gaseous distention of the large bowel, questionable ileus although distal small bowel obstruction cannot be excluded.  She presented to the ED and had CT scan ruling out obstruction.  Suggestion of enteritis with mild ileus.  Patient having multiple loose stools.  No further dry heaves although she had this persistently over the past week during her hospitalization. RUQ u/s with gallstones and sludge but no cholecystitis or cbd dilation.   Feeling somewhat better today.  She is passing stool.  No abdominal discomfort.  She believes she had a black stool.  One witnessed this morning was not black.  Hemoglobin is stable.  Requested that she show her stools to nursing staff for evaluation.  Diarrhea, GI pathogen panel pending.  C. difficile was negative.   Plan: 1. Monitor for black stool.  Patient to let nursing staff see her bowel movements. 2. Continue supportive measures. 3. Her diet has been advanced, we will see how she does today. 4. Dr. Oneida Alar to review CT with Dr. Thornton Papas. Abnormal small bowel as noted. Patient and family prefer conservative measures.   Laureen Ochs. Bernarda Caffey [facility] [phone] 3/28/20199:22 AM     LOS: 1 day

## 2017-12-14 DIAGNOSIS — R1011 Right upper quadrant pain: Secondary | ICD-10-CM

## 2017-12-14 DIAGNOSIS — K5730 Diverticulosis of large intestine without perforation or abscess without bleeding: Secondary | ICD-10-CM | POA: Diagnosis not present

## 2017-12-14 DIAGNOSIS — E860 Dehydration: Secondary | ICD-10-CM | POA: Diagnosis not present

## 2017-12-14 DIAGNOSIS — J181 Lobar pneumonia, unspecified organism: Secondary | ICD-10-CM

## 2017-12-14 DIAGNOSIS — K50018 Crohn's disease of small intestine with other complication: Secondary | ICD-10-CM | POA: Diagnosis not present

## 2017-12-14 DIAGNOSIS — K567 Ileus, unspecified: Secondary | ICD-10-CM | POA: Diagnosis not present

## 2017-12-14 NOTE — Discharge Summary (Signed)
Physician Discharge Summary  Denise Parrish NWG:[id number] DOB: 05-19-1923 DOA: 12/11/2017  PCP: Denise Evens, MD  Admit date: 12/11/2017 Discharge date: 12/14/2017  Admitted From: SNF Disposition:  Home   Recommendations for Outpatient Follow-up:  1. Follow up with PCP in 1-2 weeks 2. Please obtain BMP/CBC in one week 3. Please follow up on the following pending results:  Home Health: YES Equipment/Devices: HHPT  Discharge Condition: Stable CODE STATUS: FULL Diet recommendation: Heart Healthy   Brief/Interim Summary: 84 y.o.femalewith medical history ofhypertension, hyperlipidemia, anxiety, diabetes mellitus type 2, GERD presenting with abdominal distention of one day duration. The patient was recently discharged from the hospital after a stay from 12/08/17 to 12/11/17 during which she was treated for sepsis secondary to pneumonia. The patient was discharged to the skilled nursing facility on 12/11/2017 with Augmentin for 6 additional days. She was noted to have increasing abdominal distention at the nursing facility. Abdominal x-rays showed diffuse small bowel dilatation with gaseous distention of the small and large bowel with air-fluid levels. There was concern for ileus versus small bowel obstruction. The patient was sent back to the emergency department for further evaluation. The patient did not previously noted worsening abdominal distention, but now has mild diffuse abdominal pain.States that she had a dry heave and she has had one loose bowel movement on the morning of 12/12/2017. CT of the abdomen and pelvis showed thickened and mildly inflamed small bowel loops in the lower abdomen/pelvis consistent with enteritis with associated ileus. There was bibasilar atelectasis with right basilar scarring/atelectasis. There are small bilateral pleural effusions.  Stool for C. difficile and GI pathogen panel were ordered which were negative.  GI was consulted to assist.  They  recommended conservative therapy.  The patient gradually improved.  Her diet was advanced which she tolerated.  Her abdominal pain improved.  The patient did not want to go back to skilled nursing facility.  Physical therapy reevaluated the patient and felt the patient functionally was able to go home.  Home health was set up.    Discharge Diagnoses:  Enteritis/adynamic ileus -Check stool pathogen panel--neg -Cdiff neg -May be in part due to acute illness/ischemia and antimotility agents she had received  -GI consult appreciated--DDx infx vs crohns more likely than ischemia or neoplasm; conservative tx as pt is improving -diet advanced to dys 3 which pt is tolerating -Convert essential medicines to IV -Continued IV fluids -Continue PPI -Check TSH--3.149; Free T4--1.09 -RUQ ultrasound--cholelithiasis without cholecystitis -CT abd as discussed above -pt passing flatus and had multiple BMs during hospitalization  Pneumonia -Started ceftriaxone and azithromycin IV as the patient remainsnpo--she was previously started on 12/08/2017 -stop abx after 12/14/17 doses which would be 1 week of tx  Diabetes mellitus type 2 -12/08/2017 hemoglobin A1c 6.0 -Discontinue Amaryl during hospitalization; discuss with PCP if pt needs to restart after d/c -CBGs controlledduring last hospitalization -will not start ISS  Essential hypertension -Started IV Lopressor while the patient remainednpo -restart po metoprolol now she is tolerating po  Anxiety -Continue home dose of alprazolam     Discharge Instructions  Discharge Instructions    Diet - low sodium heart healthy   Complete by:  As directed    Increase activity slowly   Complete by:  As directed      Allergies as of 12/14/2017   No Known Allergies     Medication List    STOP taking these medications   amoxicillin-clavulanate 875-125 MG tablet Commonly known as:  AUGMENTIN   LOPERAMIDE  A-D 2 MG tablet Generic drug:   loperamide     TAKE these medications   acetaminophen 325 MG tablet Commonly known as:  TYLENOL Take 325 mg by mouth every 6 (six) hours as needed for moderate pain or headache.   ALPRAZolam 0.5 MG tablet Commonly known as:  XANAX Take 1 tablet (0.5 mg total) by mouth 3 (three) times daily.   aspirin EC 81 MG tablet Take 81 mg by mouth daily.   glimepiride 1 MG tablet Commonly known as:  AMARYL Take 1 mg by mouth daily.   ibuprofen 200 MG tablet Commonly known as:  ADVIL,MOTRIN Take 2 tablets (400 mg total) by mouth every 8 (eight) hours as needed for headache or moderate pain.   magnesium oxide 400 MG tablet Commonly known as:  MAG-OX Take 800 mg by mouth daily.   methocarbamol 500 MG tablet Commonly known as:  ROBAXIN Take 1 tablet (500 mg total) by mouth every 8 (eight) hours as needed for muscle spasms.   metoprolol tartrate 25 MG tablet Commonly known as:  LOPRESSOR Take 1 tablet by mouth 2 (two) times daily.   omeprazole 20 MG capsule Commonly known as:  PRILOSEC Take 20 mg by mouth Daily.   VITAMIN D-3 PO Take 5,000 Units by mouth daily.      Follow-up Information    [facility].   Specialty:  Home Health Services Contact information: [address] [place] [address] [phone]          No Known Allergies  Consultations:  GI--Fields   Procedures/Studies: Dg Chest 2 View  Result Date: 12/08/2017 CLINICAL DATA:  Fever. EXAM: CHEST - 2 VIEW COMPARISON:  05/28/2015 FINDINGS: Heart size upper normal, likely normal for technique. Chronic hyperinflation and interstitial coarsening. Minimal patchy right suprahilar opacity. No pulmonary edema. No pleural effusion or pneumothorax. No acute osseous abnormalities. IMPRESSION: Minimal patchy right suprahilar opacities suspicious for pneumonia/pneumonitis in the setting of fever. Electronically Signed   By: Denise Parrish M.D.   On: 12/08/2017 06:23   Ct Abdomen  Pelvis W Contrast  Result Date: 12/12/2017 CLINICAL DATA:  84 year old female with abdominal distention. EXAM: CT ABDOMEN AND PELVIS WITH CONTRAST TECHNIQUE: Multidetector CT imaging of the abdomen and pelvis was performed using the standard protocol following bolus administration of intravenous contrast. CONTRAST:  146mL ISOVUE-300 IOPAMIDOL (ISOVUE-300) INJECTION 61% COMPARISON:  Abdominal radiograph dated 12/11/2017 and CT dated 09/04/2016 FINDINGS: Lower chest: Small bilateral pleural effusions with associated mild partial compressive atelectasis of the lower lobes. Minimal right lung base atelectasis/scarring. Pneumonia is not entirely excluded. Clinical correlation is recommended. Partially visualized mechanical mitral valve. No intra-abdominal free air.  Trace free fluid within the pelvis. Hepatobiliary: The liver is unremarkable. There is a 3 cm stone within the gallbladder. The gallbladder is mildly distended. No pericholecystic fluid or evidence of acute cholecystitis by CT. Pancreas: Unremarkable. No pancreatic ductal dilatation or surrounding inflammatory changes. Spleen: Normal in size without focal abnormality. Adrenals/Urinary Tract: Left adrenal thickening/hyperplasia. The right adrenal gland is unremarkable. There is bilateral extrarenal pelvis with mild pelviectasis. Multiple small bilateral renal hypodense lesions noted. The largest lesion in the upper pole of the left kidney measures 4 cm consistent with cysts. The smaller lesions are too small to characterize. There is symmetric enhancement and excretion of contrast by both kidneys. The visualized ureters and urinary bladder appear unremarkable. Stomach/Bowel: There is extensive sigmoid diverticulosis without active inflammatory changes. Multiple mildly dilated and fluid-filled loops of small bowel  noted in the mid to lower abdomen. There is thickened and mildly inflamed loops of small bowel in the lower abdomen and pelvis. Findings most  consistent with enteritis with associated ileus. No definite transition zone identified to suggest obstruction. Loose stool within the colon compatible with diarrheal state. The appendix is normal. Vascular/Lymphatic: Moderate aortoiliac atherosclerotic disease. No portal venous gas. There is no adenopathy. Reproductive: The uterus and ovaries are grossly unremarkable as visualized. Other: None Musculoskeletal: Osteopenia with degenerative changes of the spine. Total right hip arthroplasty with associated streak artifact. Left hip arthritic changes. No acute osseous pathology. IMPRESSION: 1. Diarrheal state with findings of enteritis and associated mild ileus. Correlation with clinical exam and stool cultures recommended. No definite evidence of bowel obstruction. Normal appendix. 2. Extensive sigmoid diverticulosis without active inflammatory changes. 3. Cholelithiasis. 4.  Aortic Atherosclerosis (ICD10-I70.0). Electronically Signed   By: Anner Crete M.D.   On: 12/12/2017 00:57   Dg Abd Acute W/chest  Result Date: 12/11/2017 CLINICAL DATA:  Abdominal distension.  Chest pain. EXAM: DG ABDOMEN ACUTE W/ 1V CHEST COMPARISON:  12/08/2017 chest radiograph. 09/04/2016 CT abdomen/pelvis. 05/28/2015 abdominal radiographs. FINDINGS: Surgical hardware from ACDF is partially visualized overlying the lower cervical spine. Stable cardiomediastinal silhouette with normal heart size. No pneumothorax. No pleural effusion. No pulmonary edema. No acute consolidative airspace disease. There are moderately dilated small bowel loops throughout the abdomen demonstrating air-fluid levels, measuring up to 5.4 cm diameter. There is mild gaseous distention of the large bowel with scattered air-fluid levels throughout the colon. No evidence of pneumatosis or pneumoperitoneum. No radiopaque nephrolithiasis. Partially visualized right hip hemiarthroplasty. IMPRESSION: 1. No active cardiopulmonary disease. 2. Diffuse moderate small  bowel dilatation. Mild diffuse gaseous distention of the large bowel. Air-fluid levels throughout the small and large bowel. Findings are most suggestive of a diffuse adynamic ileus, although a distal small bowel obstruction cannot be excluded. Electronically Signed   By: Ilona Sorrel M.D.   On: 12/11/2017 19:40   US Abdomen Limited Ruq  Result Date: 12/12/2017 CLINICAL DATA:  Right upper quadrant pain EXAM: ULTRASOUND ABDOMEN LIMITED RIGHT UPPER QUADRANT COMPARISON:  CT abdomen and pelvis December 12, 2017 FINDINGS: Gallbladder: Within the gallbladder, there is a 3.6 cm in size echogenic focus which moves and shadows consistent with cholelithiasis. There is also sludge within the gallbladder. No gallbladder wall thickening or pericholecystic fluid. No sonographic Murphy sign noted by sonographer. Common bile duct: Diameter: 7 mm, upper normal. No biliary duct mass or calculus evident. Liver: No focal lesion identified. Within normal limits in parenchymal echogenicity. Portal vein is patent on color Doppler imaging with normal direction of blood flow towards the liver. IMPRESSION: Cholelithiasis and sludge in gallbladder. No appreciable gallbladder wall thickening or pericholecystic fluid. Common bile duct upper normal in size. No liver lesions evident. Electronically Signed   By: Lowella Grip III M.D.   On: 12/12/2017 10:16        Discharge Exam: Vitals:   12/13/17 1951 12/14/17 0449  BP: (!) 145/55 119/60  Pulse: 72 64  Resp:    Temp: 97.7 F (36.5 C) 98.1 F (36.7 C)  SpO2: 98% 97%   Vitals:   12/13/17 1248 12/13/17 1300 12/13/17 1951 12/14/17 0449  BP:  (!) 128/51 (!) 145/55 119/60  Pulse:  89 72 64  Resp: 15 17    Temp:  98.1 F (36.7 C) 97.7 F (36.5 C) 98.1 F (36.7 C)  TempSrc:  Oral Oral Oral  SpO2: 99% 99% 98% 97%  Weight:      Height:        General: Pt is alert, awake, not in acute distress Cardiovascular: RRR, S1/S2 +, no rubs, no gallops Respiratory: CTA  bilaterally, no wheezing, no rhonchi Abdominal: Soft, NT, ND, bowel sounds + Extremities: no edema, no cyanosis   The results of significant diagnostics from this hospitalization (including imaging, microbiology, ancillary and laboratory) are listed below for reference.    Significant Diagnostic Studies: Dg Chest 2 View  Result Date: 12/08/2017 CLINICAL DATA:  Fever. EXAM: CHEST - 2 VIEW COMPARISON:  05/28/2015 FINDINGS: Heart size upper normal, likely normal for technique. Chronic hyperinflation and interstitial coarsening. Minimal patchy right suprahilar opacity. No pulmonary edema. No pleural effusion or pneumothorax. No acute osseous abnormalities. IMPRESSION: Minimal patchy right suprahilar opacities suspicious for pneumonia/pneumonitis in the setting of fever. Electronically Signed   By: Denise Parrish M.D.   On: 12/08/2017 06:23   Ct Abdomen Pelvis W Contrast  Result Date: 12/12/2017 CLINICAL DATA:  84 year old female with abdominal distention. EXAM: CT ABDOMEN AND PELVIS WITH CONTRAST TECHNIQUE: Multidetector CT imaging of the abdomen and pelvis was performed using the standard protocol following bolus administration of intravenous contrast. CONTRAST:  124mL ISOVUE-300 IOPAMIDOL (ISOVUE-300) INJECTION 61% COMPARISON:  Abdominal radiograph dated 12/11/2017 and CT dated 09/04/2016 FINDINGS: Lower chest: Small bilateral pleural effusions with associated mild partial compressive atelectasis of the lower lobes. Minimal right lung base atelectasis/scarring. Pneumonia is not entirely excluded. Clinical correlation is recommended. Partially visualized mechanical mitral valve. No intra-abdominal free air.  Trace free fluid within the pelvis. Hepatobiliary: The liver is unremarkable. There is a 3 cm stone within the gallbladder. The gallbladder is mildly distended. No pericholecystic fluid or evidence of acute cholecystitis by CT. Pancreas: Unremarkable. No pancreatic ductal dilatation or surrounding  inflammatory changes. Spleen: Normal in size without focal abnormality. Adrenals/Urinary Tract: Left adrenal thickening/hyperplasia. The right adrenal gland is unremarkable. There is bilateral extrarenal pelvis with mild pelviectasis. Multiple small bilateral renal hypodense lesions noted. The largest lesion in the upper pole of the left kidney measures 4 cm consistent with cysts. The smaller lesions are too small to characterize. There is symmetric enhancement and excretion of contrast by both kidneys. The visualized ureters and urinary bladder appear unremarkable. Stomach/Bowel: There is extensive sigmoid diverticulosis without active inflammatory changes. Multiple mildly dilated and fluid-filled loops of small bowel noted in the mid to lower abdomen. There is thickened and mildly inflamed loops of small bowel in the lower abdomen and pelvis. Findings most consistent with enteritis with associated ileus. No definite transition zone identified to suggest obstruction. Loose stool within the colon compatible with diarrheal state. The appendix is normal. Vascular/Lymphatic: Moderate aortoiliac atherosclerotic disease. No portal venous gas. There is no adenopathy. Reproductive: The uterus and ovaries are grossly unremarkable as visualized. Other: None Musculoskeletal: Osteopenia with degenerative changes of the spine. Total right hip arthroplasty with associated streak artifact. Left hip arthritic changes. No acute osseous pathology. IMPRESSION: 1. Diarrheal state with findings of enteritis and associated mild ileus. Correlation with clinical exam and stool cultures recommended. No definite evidence of bowel obstruction. Normal appendix. 2. Extensive sigmoid diverticulosis without active inflammatory changes. 3. Cholelithiasis. 4.  Aortic Atherosclerosis (ICD10-I70.0). Electronically Signed   By: Anner Crete M.D.   On: 12/12/2017 00:57   Dg Abd Acute W/chest  Result Date: 12/11/2017 CLINICAL DATA:  Abdominal  distension.  Chest pain. EXAM: DG ABDOMEN ACUTE W/ 1V CHEST COMPARISON:  12/08/2017 chest radiograph. 09/04/2016 CT abdomen/pelvis. 05/28/2015 abdominal radiographs.  FINDINGS: Surgical hardware from ACDF is partially visualized overlying the lower cervical spine. Stable cardiomediastinal silhouette with normal heart size. No pneumothorax. No pleural effusion. No pulmonary edema. No acute consolidative airspace disease. There are moderately dilated small bowel loops throughout the abdomen demonstrating air-fluid levels, measuring up to 5.4 cm diameter. There is mild gaseous distention of the large bowel with scattered air-fluid levels throughout the colon. No evidence of pneumatosis or pneumoperitoneum. No radiopaque nephrolithiasis. Partially visualized right hip hemiarthroplasty. IMPRESSION: 1. No active cardiopulmonary disease. 2. Diffuse moderate small bowel dilatation. Mild diffuse gaseous distention of the large bowel. Air-fluid levels throughout the small and large bowel. Findings are most suggestive of a diffuse adynamic ileus, although a distal small bowel obstruction cannot be excluded. Electronically Signed   By: Ilona Sorrel M.D.   On: 12/11/2017 19:40   US Abdomen Limited Ruq  Result Date: 12/12/2017 CLINICAL DATA:  Right upper quadrant pain EXAM: ULTRASOUND ABDOMEN LIMITED RIGHT UPPER QUADRANT COMPARISON:  CT abdomen and pelvis December 12, 2017 FINDINGS: Gallbladder: Within the gallbladder, there is a 3.6 cm in size echogenic focus which moves and shadows consistent with cholelithiasis. There is also sludge within the gallbladder. No gallbladder wall thickening or pericholecystic fluid. No sonographic Murphy sign noted by sonographer. Common bile duct: Diameter: 7 mm, upper normal. No biliary duct mass or calculus evident. Liver: No focal lesion identified. Within normal limits in parenchymal echogenicity. Portal vein is patent on color Doppler imaging with normal direction of blood flow towards the  liver. IMPRESSION: Cholelithiasis and sludge in gallbladder. No appreciable gallbladder wall thickening or pericholecystic fluid. Common bile duct upper normal in size. No liver lesions evident. Electronically Signed   By: Lowella Grip III M.D.   On: 12/12/2017 10:16     Microbiology: Recent Results (from the past 240 hour(s))  Blood culture (routine x 2)     Status: None   Collection Time: 12/08/17  5:20 AM  Result Value Ref Range Status   Specimen Description RIGHT ANTECUBITAL  Final   Special Requests   Final    BOTTLES DRAWN AEROBIC AND ANAEROBIC Blood Culture adequate volume   Culture  Setup Time   Final    NO ORGANISMS SEEN ANAEROBIC BOTTLE RELOADED, PLATED   Culture   Final    NO GROWTH 5 DAYS Performed at [facility], [address], [place], [place] [address]    Report Status 12/13/2017 FINAL  Final  Blood culture (routine x 2)     Status: None   Collection Time: 12/08/17  6:04 AM  Result Value Ref Range Status   Specimen Description LEFT ANTECUBITAL  Final   Special Requests   Final    BOTTLES DRAWN AEROBIC AND ANAEROBIC Blood Culture adequate volume   Culture  Setup Time NO ORGANISMS SEEN AEROBIC BOTTLE RELOADED, PLATED  Final   Culture   Final    NO GROWTH 5 DAYS Performed at [facility], [address], [place], [place] [address]    Report Status 12/13/2017 FINAL  Final  Urine Culture     Status: None   Collection Time: 12/08/17  6:17 AM  Result Value Ref Range Status   Specimen Description   Final    URINE, CATHETERIZED Performed at [facility], [address], [place], [place] [address]    Special Requests   Final    NONE Performed at [facility], [address], [place], [place] [address]    Culture   Final    NO GROWTH  Performed at [facility], [place] [address], [place], [place] [address]    Report Status 12/09/2017 FINAL  Final  Gastrointestinal Panel by PCR , Stool     Status: None   Collection Time: 12/12/17  9:02 AM  Result Value Ref  Range Status   Campylobacter species NOT DETECTED NOT DETECTED Final   Plesimonas shigelloides NOT DETECTED NOT DETECTED Final   Salmonella species NOT DETECTED NOT DETECTED Final   Yersinia enterocolitica NOT DETECTED NOT DETECTED Final   Vibrio species NOT DETECTED NOT DETECTED Final   Vibrio cholerae NOT DETECTED NOT DETECTED Final   Enteroaggregative E coli (EAEC) NOT DETECTED NOT DETECTED Final   Enteropathogenic E coli (EPEC) NOT DETECTED NOT DETECTED Final   Enterotoxigenic E coli (ETEC) NOT DETECTED NOT DETECTED Final   Shiga like toxin producing E coli (STEC) NOT DETECTED NOT DETECTED Final   Shigella/Enteroinvasive E coli (EIEC) NOT DETECTED NOT DETECTED Final   Cryptosporidium NOT DETECTED NOT DETECTED Final   Cyclospora cayetanensis NOT DETECTED NOT DETECTED Final   Entamoeba histolytica NOT DETECTED NOT DETECTED Final   Giardia lamblia NOT DETECTED NOT DETECTED Final   Adenovirus F40/41 NOT DETECTED NOT DETECTED Final   Astrovirus NOT DETECTED NOT DETECTED Final   Norovirus GI/GII NOT DETECTED NOT DETECTED Final   Rotavirus A NOT DETECTED NOT DETECTED Final   Sapovirus (I, II, IV, and V) NOT DETECTED NOT DETECTED Final    Comment: Performed at [facility], [address] [place], [place] [address]  C difficile quick scan w PCR reflex     Status: None   Collection Time: 12/12/17  9:02 AM  Result Value Ref Range Status   C Diff antigen NEGATIVE NEGATIVE Final   C Diff toxin NEGATIVE NEGATIVE Final   C Diff interpretation No C. difficile detected.  Final    Comment: Performed at [facility], [address], [place], [place] [address]  MRSA PCR Screening     Status: None   Collection Time: 12/12/17  6:57 PM  Result Value Ref Range Status   MRSA by PCR NEGATIVE NEGATIVE Final    Comment:        The GeneXpert MRSA Assay (FDA approved for NASAL specimens only), is one component of a comprehensive MRSA colonization surveillance program. It is not intended to  diagnose MRSA infection nor to guide or monitor treatment for MRSA infections. Performed at [facility], [address], [place], [place] [address]      Labs: Basic Metabolic Panel: Recent Labs  Lab 12/08/17 0520 12/08/17 1858 12/09/17 0657 12/11/17 2012 12/12/17 0606 12/13/17 0841  NA 140  --  141 135 135 137  K 4.2  --  3.7 4.4 4.3 4.4  CL 101  --  110 99* 101 103  CO2 25  --  24 21* 22 23  GLUCOSE 189*  --  125* 114* 113* 114*  BUN 16  --  11 10 10 12   CREATININE 1.01*  --  0.90 0.90 0.81 0.91  CALCIUM 8.9  --  8.3* 8.9 8.3* 8.7*  MG  --  1.9  --  2.0  --  1.9  PHOS  --  2.5  --   --   --   --    Liver Function Tests: Recent Labs  Lab 12/08/17 0520 12/11/17 2012 12/12/17 0606  AST 30 24 24   ALT 22 18 19   ALKPHOS 77 61 55  BILITOT 1.2 1.3* 1.2  PROT 7.3 7.0 6.2*  ALBUMIN 3.8 3.4* 2.9*   Recent Labs  Lab 12/11/17 2012  LIPASE 23   No results for input(s): AMMONIA in the last 168 hours. CBC: Recent Labs  Lab 12/08/17 0520 12/09/17 0657 12/11/17 2012 12/12/17 0606 12/13/17 0841  WBC 13.0* 10.4 11.6* 8.5 7.2  NEUTROABS 11.1*  --   --  5.9  --   HGB 13.1 10.5* 12.2 11.5* 11.4*  HCT 40.9 33.2* 38.5 35.5* 35.2*  MCV 93.0 93.5 93.2 93.7 92.4  PLT 270 230 283 269 300   Cardiac Enzymes: Recent Labs  Lab 12/08/17 0520  TROPONINI <0.03   BNP: Invalid input(s): POCBNP CBG: Recent Labs  Lab 12/10/17 1115 12/10/17 1610 12/10/17 2154 12/11/17 0813 12/11/17 1227  GLUCAP 126* 127* 111* 125* 112*    Time coordinating discharge:  Greater than 30 minutes  Signed:  Orson Eva, DO [facility] Pager: [phone] 12/14/2017, 12:19 PM

## 2017-12-14 NOTE — Clinical Social Work Note (Signed)
LCSW following. Per PT, pt is safe to dc home with HHPT. Met with pt who indicated that she prefers to go home. Updated Kerri at [facility]. RN CM to finalize dc.

## 2017-12-14 NOTE — Evaluation (Signed)
Physical Therapy Evaluation Patient Details Name: Denise Parrish MRN: [id number] DOB: 11/19/1922 Today's Date: 12/14/2017   History of Present Illness  Denise Parrish is a 84 y.o. female with medical history of hypertension, hyperlipidemia, anxiety, diabetes mellitus type 2, GERD presenting with abdominal distention of one day duration.  The patient was recently discharged from the hospital after a stay from 12/09/1931 2619 during which she was treated for sepsis secondary to pneumonia.  The patient was discharged to the skilled nursing facility on 12/11/2017 with Augmentin for 6 additional days.  She was noted to have increasing abdominal distention at the nursing facility.  Abdominal x-rays showed diffuse small bowel dilatation with gaseous distention of the small and large bowel with air-fluid levels.  There was concern for ileus versus small bowel obstruction.  The patient was sent back to the emergency department for further evaluation.  The patient did not previously noted worsening abdominal distention, but now has mild diffuse abdominal pain.  She 12/10/2017, but no emesis since that time.  States that she had a dry heave on she has had one loose bowel movement on the morning of 12/12/2017.  There is no hematochezia, melena, dysuria, hematuria, fevers, chills.  There is no chest pain or shortness of breath.  During the last hospitalization, the patient stated that she did have loose stools for which she was treated with Imodium.    Clinical Impression  Patient functioning at baseline for functional mobility and gait on level surfaces and on stairs in stairwell without loss of balance.  Plan: discharge patient from physical therapy to care of nursing.    Follow Up Recommendations Home health PT    Equipment Recommendations  None recommended by PT    Recommendations for Other Services       Precautions / Restrictions Precautions Precautions: None Restrictions Weight Bearing  Restrictions: No      Mobility  Bed Mobility Overal bed mobility: Independent                Transfers Overall transfer level: Independent                  Ambulation/Gait Ambulation/Gait assistance: Independent Ambulation Distance (Feet): 150 Feet Assistive device: None Gait Pattern/deviations: WFL(Within Functional Limits)        Stairs Stairs: Yes Stairs assistance: Modified independent (Device/Increase time) Stair Management: One rail Right;One rail Left;Alternating pattern Number of Stairs: 5 General stair comments: Patient demonstrates good return for going up/down 5 steps using 1 siderail without loss of balance.  Wheelchair Mobility    Modified Rankin (Stroke Patients Only)       Balance Overall balance assessment: No apparent balance deficits (not formally assessed)                                           Pertinent Vitals/Pain Pain Assessment: No/denies pain    Home Living Family/patient expects to be discharged to:: Private residence Living Arrangements: Alone Available Help at Discharge: Family;Other (Comment) Type of Home: House Home Access: Stairs to enter Entrance Stairs-Rails: Right;Left;Can reach both Entrance Stairs-Number of Steps: 5 Home Layout: One level Home Equipment: Walker - 2 wheels;Bedside commode;Wheelchair - manual      Prior Function Level of Independence: Independent with assistive device(s)         Comments: Ambulates household, short distanced community     Hand Dominance          Extremity/Trunk Assessment   Upper Extremity Assessment Upper Extremity Assessment: Overall WFL for tasks assessed    Lower Extremity Assessment Lower Extremity Assessment: Overall WFL for tasks assessed    Cervical / Trunk Assessment Cervical / Trunk Assessment: Normal  Communication   Communication: No difficulties  Cognition Arousal/Alertness: Awake/alert Behavior During Therapy: WFL for  tasks assessed/performed Overall Cognitive Status: Within Functional Limits for tasks assessed                                        General Comments      Exercises     Assessment/Plan    PT Assessment Patent does not need any further PT services;All further PT needs can be met in the next venue of care  PT Problem List         PT Treatment Interventions      PT Goals (Current goals can be found in the Care Plan section)  Acute Rehab PT Goals Patient Stated Goal: return home today PT Goal Formulation: With patient Time For Goal Achievement: 2017/12/29 Potential to Achieve Goals: Good    Frequency     Barriers to discharge        Co-evaluation               AM-PAC PT "6 Clicks" Daily Activity  Outcome Measure Difficulty turning over in bed (including adjusting bedclothes, sheets and blankets)?: None Difficulty moving from lying on back to sitting on the side of the bed? : None Difficulty sitting down on and standing up from a chair with arms (e.g., wheelchair, bedside commode, etc,.)?: None Help needed moving to and from a bed to chair (including a wheelchair)?: None Help needed walking in hospital room?: None Help needed climbing 3-5 steps with a railing? : None 6 Click Score: 24    End of Session   Activity Tolerance: Patient tolerated treatment well Patient left: in chair;with call bell/phone within reach Nurse Communication: Mobility status PT Visit Diagnosis: Unsteadiness on feet (R26.81);Other abnormalities of gait and mobility (R26.89);Muscle weakness (generalized) (M62.81)    Time: 2620-3559 PT Time Calculation (min) (ACUTE ONLY): 21 min   Charges:   PT Evaluation $PT Eval Low Complexity: 1 Low PT Treatments $Gait Training: 8-22 mins   PT G Codes:        3:12 PM, Dec 29, 2017 [name], MPT Physical Therapist with [facility] 336 [phone] office [phone] mobile phone

## 2017-12-14 NOTE — Progress Notes (Signed)
Patient and family received discharge instructions. Verbal understanding of discharge instructions was accepted from both parties. Patient was escorted by staff via wheelchair to vehicle. Patient discharged to home in stable condition.

## 2017-12-14 NOTE — Progress Notes (Signed)
Subjective: Feeling well. Worked with PT today without issue. Denies abdominal pain, mild tenderness is resolving. Having bowel movements which were more formed/[place] 4 today. No hematochezia or melena. No other GI symptoms.  Objective: Vital signs in last 24 hours: Temp:  [97.7 F (36.5 C)-98.6 F (37 C)] 98.1 F (36.7 C) (03/29 0449) Pulse Rate:  [64-89] 64 (03/29 0449) Resp:  [15-17] 17 (03/28 1300) BP: (119-147)/(51-66) 119/60 (03/29 0449) SpO2:  [97 %-99 %] 97 % (03/29 0449) Last BM Date: 12/13/17 General:   Alert and oriented, pleasant Head:  Normocephalic and atraumatic. Eyes:  No icterus, sclera clear. Conjuctiva pink.  Heart:  S1, S2 present, no murmurs noted.  Lungs: Clear to auscultation bilaterally, without wheezing, rales, or rhonchi.  Abdomen:  Bowel sounds present, soft, non-tender, non-distended. No HSM or hernias noted. No rebound or guarding. No masses appreciated  Msk:  Symmetrical without gross deformities. Extremities:  Without clubbing or edema. Neurologic:  Alert and  oriented x4;  grossly normal neurologically. Psych:  Alert and cooperative. Normal mood and affect.  Intake/Output from previous day: No intake/output data recorded. Intake/Output this shift: No intake/output data recorded.  Lab Results: Recent Labs    12/11/17 2012 12/12/17 0606 12/13/17 0841  WBC 11.6* 8.5 7.2  HGB 12.2 11.5* 11.4*  HCT 38.5 35.5* 35.2*  PLT 283 269 300   BMET Recent Labs    12/11/17 2012 12/12/17 0606 12/13/17 0841  NA 135 135 137  K 4.4 4.3 4.4  CL 99* 101 103  CO2 21* 22 23  GLUCOSE 114* 113* 114*  BUN 10 10 12   CREATININE 0.90 0.81 0.91  CALCIUM 8.9 8.3* 8.7*   LFT Recent Labs    12/11/17 2012 12/12/17 0606  PROT 7.0 6.2*  ALBUMIN 3.4* 2.9*  AST 24 24  ALT 18 19  ALKPHOS 61 55  BILITOT 1.3* 1.2   PT/INR No results for input(s): LABPROT, INR in the last 72 hours. Hepatitis Panel No results for input(s): HEPBSAG, HCVAB, HEPAIGM,  HEPBIGM in the last 72 hours.   Studies/Results: US Abdomen Limited Ruq  Result Date: 12/12/2017 CLINICAL DATA:  Right upper quadrant pain EXAM: ULTRASOUND ABDOMEN LIMITED RIGHT UPPER QUADRANT COMPARISON:  CT abdomen and pelvis December 12, 2017 FINDINGS: Gallbladder: Within the gallbladder, there is a 3.6 cm in size echogenic focus which moves and shadows consistent with cholelithiasis. There is also sludge within the gallbladder. No gallbladder wall thickening or pericholecystic fluid. No sonographic Murphy sign noted by sonographer. Common bile duct: Diameter: 7 mm, upper normal. No biliary duct mass or calculus evident. Liver: No focal lesion identified. Within normal limits in parenchymal echogenicity. Portal vein is patent on color Doppler imaging with normal direction of blood flow towards the liver. IMPRESSION: Cholelithiasis and sludge in gallbladder. No appreciable gallbladder wall thickening or pericholecystic fluid. Common bile duct upper normal in size. No liver lesions evident. Electronically Signed   By: Lowella Grip III M.D.   On: 12/12/2017 10:16    Assessment: 84 year old female with recent hospitalization for community-acquired pneumonia with possible sepsis who was discharged  to a skilled nursing facility for rehabilitation on the day prior to this admission. She presented back to the hospital after abdominal distention noted by her family. Abdominal films suggested diffuse moderate small bowel dilatation with mild diffuse gaseous distention of the large bowel, questionable ileus although distal small bowel obstruction cannot be excluded. She presented to the ED and had CT scan ruling out obstruction. Suggestion of enteritis  with mild ileus. Patient having multiple loose stools. No further dry heaves although she had this persistently over the past week during her hospitalization. RUQ u/s with gallstones and sludge but no cholecystitis or cbd dilation.   She began improving  yesterday, passing stool and no abdominal discomfort. She believed she had a black stool.  One witnessed this morning was not black.  Hemoglobin is stable.  Requested that she show her stools to nursing staff for evaluation.  Diarrhea, GI pathogen panel negative.  C. difficile was negative. CT was reviewed by Dr. Oneida Alar and radiologist showing definite obstruction with pro to mix-ileal transition point; likely differentials of infectious or chron's ileitis and less likely ischemia or neoplastic process. Area only reachable with retrograde enteroscopy versus surgery or intraoperative colonoscopy. Yesterday the patient was expressed desire for conservative management without endoscopy or surgery.  Today her CBC shows stable hgb a 11.4. No leucocytosis. BMP stable with normal Cr 0.91.  Clinically she has improved significantly. Formed stools. No overt abdominal pain, tenderness nearly resolved. No hematochezia, N/V, melena. No GI complaints at this time.  Plan: 1. Continue to monitor for any "black stool" 2. Likely discharge in the next 24 hours 3. Follow-up as outpatient in 6-8 weeks.   Thank you for allowing [place] to participate in the care of Denise P Neysa Bonito, DNP, AGNP-C Adult & Gerontological Nurse Practitioner [facility]     LOS: 1 day    12/14/2017, 7:50 AM

## 2017-12-14 NOTE — Care Management Note (Signed)
Case Management Note  Patient Details  Name: Denise Parrish MRN: [id number] Date of Birth: 1923/02/12       Admitted with ileus. Coming form SNF but originally from home (only at SNF one day). Pt now would like to return home at DC. She has worked with PT and was able to ambulate independently and climb stairs. She has all necessary DME and family support. She has no had HH in the past. Pt has chosen [facility] from list of [facility] providers. She is aware HH has 48 hrs to make first visit. Blake Divine, [facility] rep, aware of referral and will pull pt info form chart.              Expected Discharge Date:     12/14/2017             Expected Discharge Plan:  [place]  In-House Referral:  NA  Discharge planning Services  CM Consult  Post Acute Care Choice:  Home Health Choice offered to:  Patient  HH Arranged:  PT [place]:  [place]  Status of Service:  Completed, signed off  Sherald Barge, RN 12/14/2017, 11:18 AM

## 2017-12-17 ENCOUNTER — Encounter: Payer: Self-pay | Admitting: Internal Medicine

## 2018-02-19 ENCOUNTER — Ambulatory Visit: Payer: Medicare Other | Admitting: Gastroenterology

## 2018-02-19 ENCOUNTER — Telehealth: Payer: Self-pay | Admitting: Gastroenterology

## 2018-02-19 ENCOUNTER — Encounter: Payer: Self-pay | Admitting: Internal Medicine

## 2018-02-19 NOTE — Telephone Encounter (Signed)
PATIENT WAS A NO SHOW AND LETTER SENT  °

## 2018-03-12 ENCOUNTER — Ambulatory Visit (HOSPITAL_COMMUNITY)
Admission: RE | Admit: 2018-03-12 | Discharge: 2018-03-12 | Disposition: A | Discharge status: Home / Self Care | Payer: Medicare Other | Source: Ambulatory Visit | Attending: Family Medicine | Admitting: Family Medicine

## 2018-03-12 ENCOUNTER — Other Ambulatory Visit (HOSPITAL_COMMUNITY): Payer: Self-pay | Admitting: Family Medicine

## 2018-03-12 DIAGNOSIS — M25551 Pain in right hip: Secondary | ICD-10-CM

## 2018-03-12 DIAGNOSIS — M4186 Other forms of scoliosis, lumbar region: Secondary | ICD-10-CM | POA: Insufficient documentation

## 2018-03-12 DIAGNOSIS — M25552 Pain in left hip: Principal | ICD-10-CM

## 2018-03-12 DIAGNOSIS — M5136 Other intervertebral disc degeneration, lumbar region: Secondary | ICD-10-CM | POA: Diagnosis not present

## 2018-03-19 ENCOUNTER — Ambulatory Visit (HOSPITAL_COMMUNITY)
Admission: RE | Admit: 2018-03-19 | Discharge: 2018-03-19 | Disposition: A | Discharge status: Home / Self Care | Payer: Medicare Other | Source: Ambulatory Visit | Attending: Family Medicine | Admitting: Family Medicine

## 2018-03-19 DIAGNOSIS — M549 Dorsalgia, unspecified: Secondary | ICD-10-CM | POA: Insufficient documentation

## 2018-03-19 DIAGNOSIS — M25552 Pain in left hip: Secondary | ICD-10-CM | POA: Insufficient documentation

## 2018-03-19 DIAGNOSIS — M25551 Pain in right hip: Secondary | ICD-10-CM | POA: Insufficient documentation

## 2018-03-19 DIAGNOSIS — M48061 Spinal stenosis, lumbar region without neurogenic claudication: Secondary | ICD-10-CM | POA: Diagnosis not present

## 2019-02-14 IMAGING — CT CT ABD-PELV W/ CM
2 of 4 series · 16 of 46 positions shown, 18 images · IV contrast (Isovue)
Comparison: Abdominal radiograph dated 12/11/2017 and CT dated
09/04/2016

CLINICAL DATA: [AGE] female with abdominal distention.

EXAM:
CT ABDOMEN AND PELVIS WITH CONTRAST
TECHNIQUE: Multidetector CT imaging of the abdomen and pelvis was performed
using the standard protocol following bolus administration of
intravenous contrast.
CONTRAST:  100mL 1BLRR6-Q22 IOPAMIDOL (1BLRR6-Q22) INJECTION 61%

[Series 2: axial st · axial · 0.92mm/px · z∈[+960,+1345]mm · 13 of 85 slices shown, 15 images]
[im 4/85  soft-tissue]
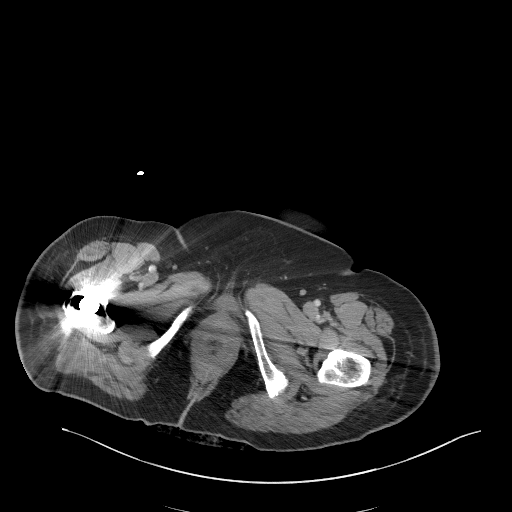
[im 4/85  bone]
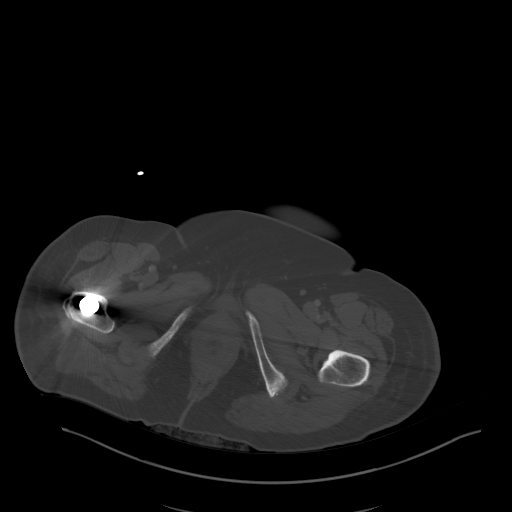
[im 12/85  soft-tissue]
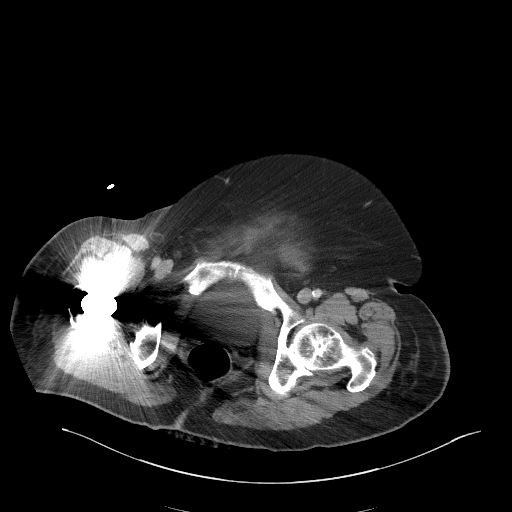
[im 20/85  soft-tissue]
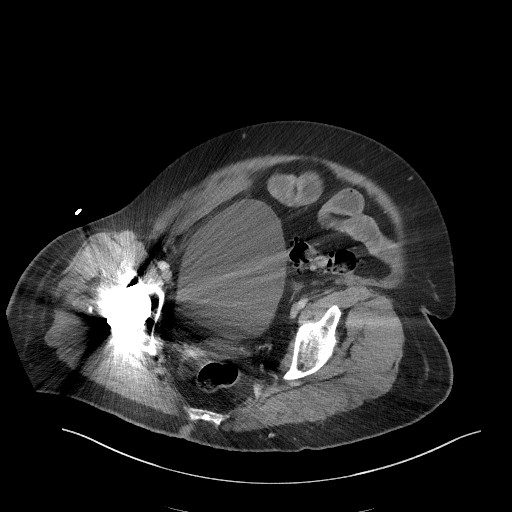
[im 23/85  soft-tissue]
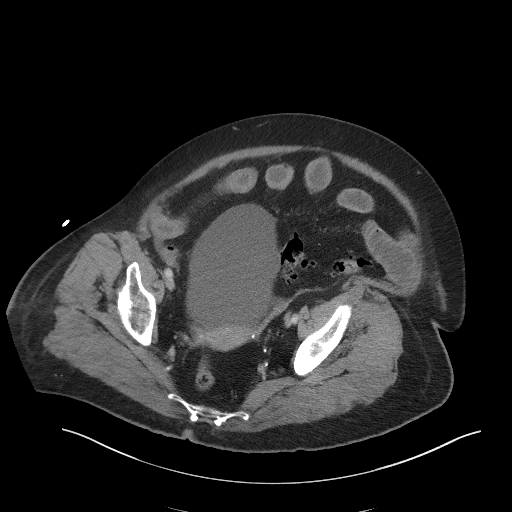
[im 31/85  soft-tissue]
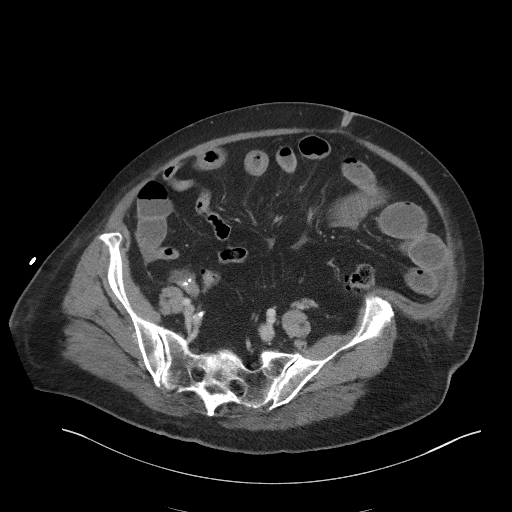
[im 35/85  soft-tissue]
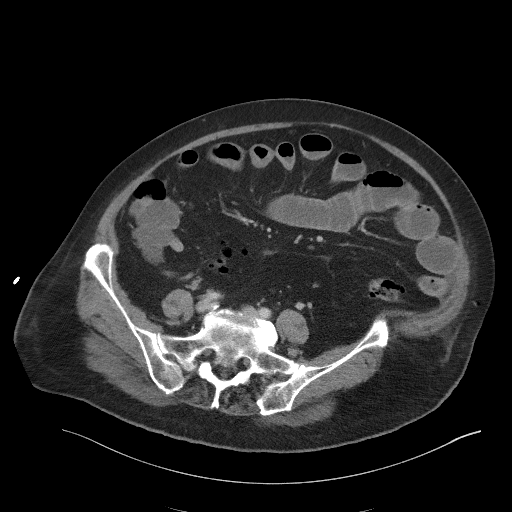
[im 43/85  soft-tissue]
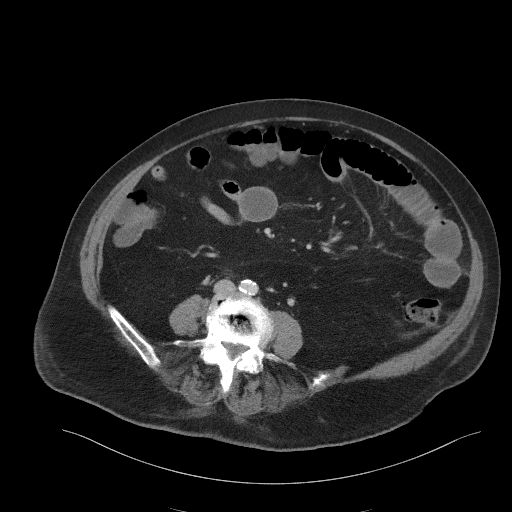
[im 50/85  soft-tissue]
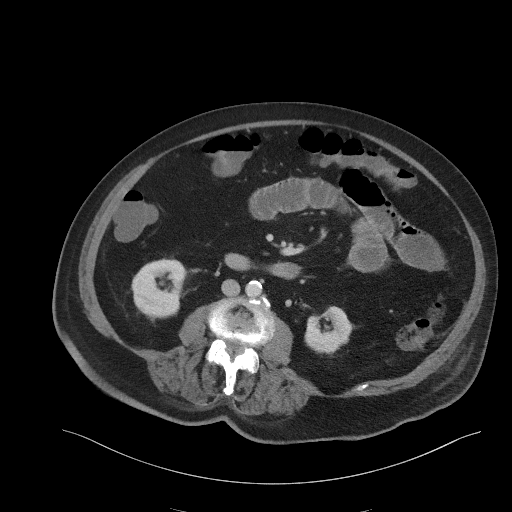
[im 54/85  soft-tissue]
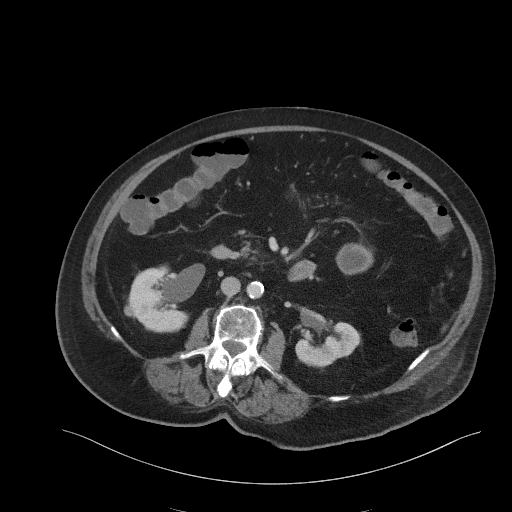
[im 54/85  bone]
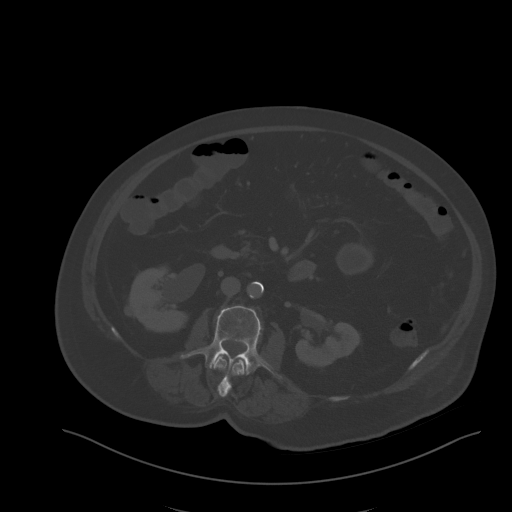
[im 62/85  soft-tissue]
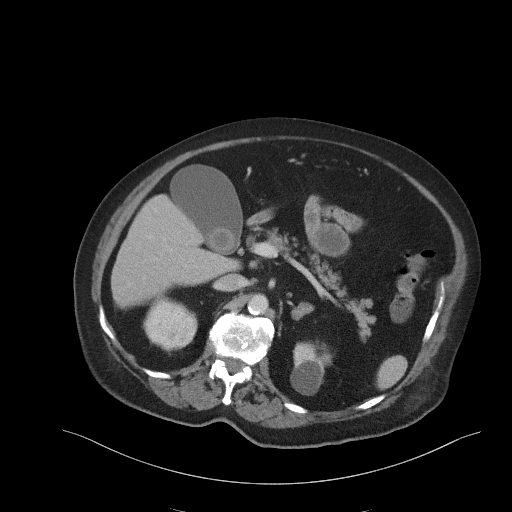
[im 65/85  soft-tissue]
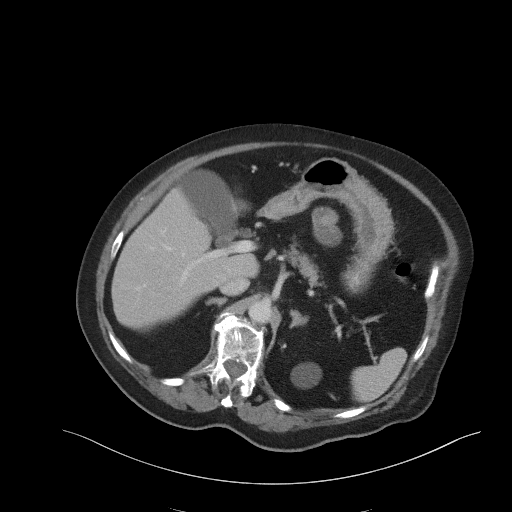
[im 73/85  soft-tissue]
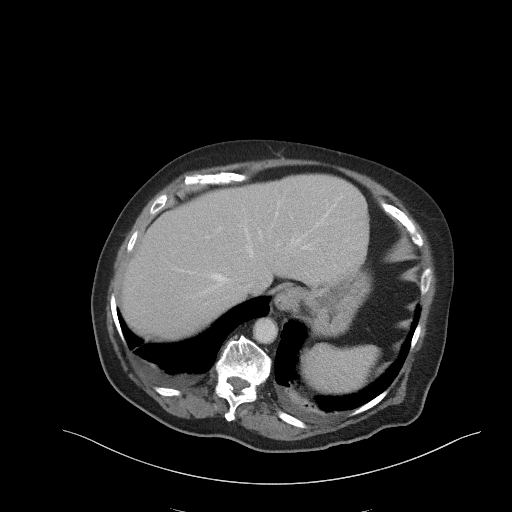
[im 81/85  soft-tissue]
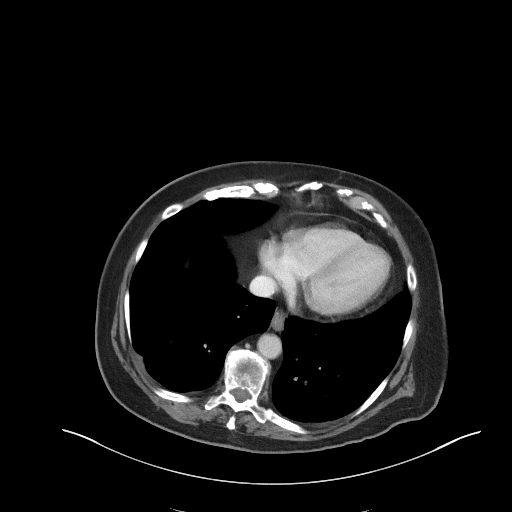

[Series 5: coronal st · coronal · 0.82mm/px · 3 of 110 slices shown]
[im 37/110  soft-tissue]
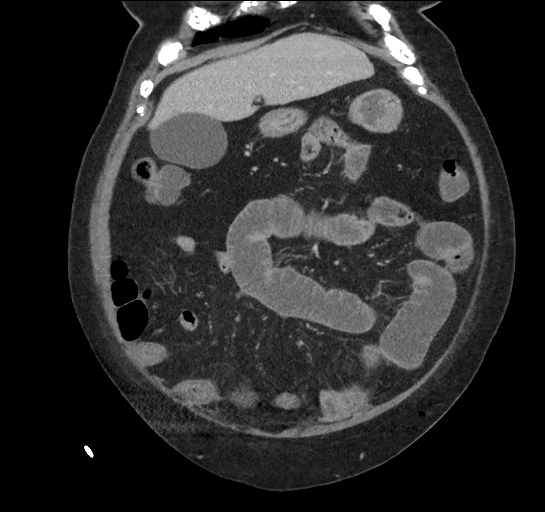
[im 49/110  soft-tissue]
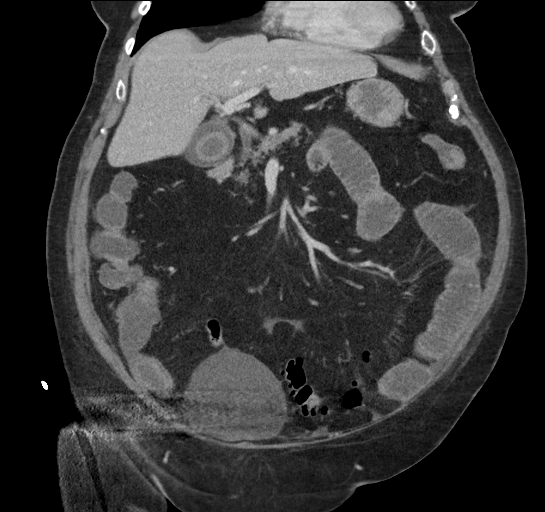
[im 61/110  soft-tissue]
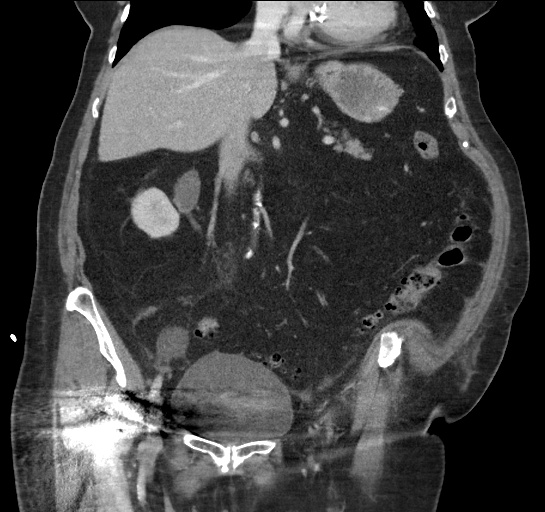

[16 of 46 positions shown; findings below may reference images not displayed]

FINDINGS: Lower chest: Small bilateral pleural effusions with associated mild
partial compressive atelectasis of the lower lobes. Minimal right
lung base atelectasis/scarring. Pneumonia is not entirely excluded.
Clinical correlation is recommended. Partially visualized mechanical
mitral valve.

No intra-abdominal free air.  Trace free fluid within the pelvis.

Hepatobiliary: The liver is unremarkable. There is a 3 cm stone
within the gallbladder. The gallbladder is mildly distended. No
pericholecystic fluid or evidence of acute cholecystitis by CT.

Pancreas: Unremarkable. No pancreatic ductal dilatation or
surrounding inflammatory changes.

Spleen: Normal in size without focal abnormality.

Adrenals/Urinary Tract: Left adrenal thickening/hyperplasia. The
right adrenal gland is unremarkable. There is bilateral extrarenal
pelvis with mild pelviectasis. Multiple small bilateral renal
hypodense lesions noted. The largest lesion in the upper pole of the
left kidney measures 4 cm consistent with cysts. The smaller lesions
are too small to characterize. There is symmetric enhancement and
excretion of contrast by both kidneys. The visualized ureters and
urinary bladder appear unremarkable.

Stomach/Bowel: There is extensive sigmoid diverticulosis without
active inflammatory changes. Multiple mildly dilated and
fluid-filled loops of small bowel noted in the mid to lower abdomen.
There is thickened and mildly inflamed loops of small bowel in the
lower abdomen and pelvis. Findings most consistent with enteritis
with associated ileus. No definite transition zone identified to
suggest obstruction. Loose stool within the colon compatible with
diarrheal state. The appendix is normal.

Vascular/Lymphatic: Moderate aortoiliac atherosclerotic disease. No
portal venous gas. There is no adenopathy.

Reproductive: The uterus and ovaries are grossly unremarkable as
visualized.

Other: None

Musculoskeletal: Osteopenia with degenerative changes of the spine.
Total right hip arthroplasty with associated streak artifact. Left
hip arthritic changes. No acute osseous pathology.
IMPRESSION: 1. Diarrheal state with findings of enteritis and associated mild
ileus. Correlation with clinical exam and stool cultures
recommended. No definite evidence of bowel obstruction. Normal
appendix.
2. Extensive sigmoid diverticulosis without active inflammatory
changes.
3. Cholelithiasis.
4.  Aortic Atherosclerosis ([id number]-[id number]).

## 2019-03-12 ENCOUNTER — Emergency Department (HOSPITAL_COMMUNITY): Payer: Medicare Other

## 2019-03-12 ENCOUNTER — Emergency Department (HOSPITAL_COMMUNITY)
Admission: EM | Admit: 2019-03-12 | Discharge: 2019-03-12 | Disposition: A | Discharge status: Home / Self Care | Payer: Medicare Other | Attending: Emergency Medicine | Admitting: Emergency Medicine

## 2019-03-12 ENCOUNTER — Encounter (HOSPITAL_COMMUNITY): Payer: Self-pay | Admitting: Emergency Medicine

## 2019-03-12 ENCOUNTER — Other Ambulatory Visit: Payer: Self-pay

## 2019-03-12 DIAGNOSIS — I10 Essential (primary) hypertension: Secondary | ICD-10-CM | POA: Diagnosis not present

## 2019-03-12 DIAGNOSIS — I358 Other nonrheumatic aortic valve disorders: Secondary | ICD-10-CM | POA: Diagnosis not present

## 2019-03-12 DIAGNOSIS — R05 Cough: Secondary | ICD-10-CM | POA: Diagnosis not present

## 2019-03-12 DIAGNOSIS — Z20828 Contact with and (suspected) exposure to other viral communicable diseases: Secondary | ICD-10-CM | POA: Insufficient documentation

## 2019-03-12 DIAGNOSIS — E119 Type 2 diabetes mellitus without complications: Secondary | ICD-10-CM | POA: Insufficient documentation

## 2019-03-12 DIAGNOSIS — R55 Syncope and collapse: Secondary | ICD-10-CM | POA: Diagnosis not present

## 2019-03-12 DIAGNOSIS — Z79899 Other long term (current) drug therapy: Secondary | ICD-10-CM | POA: Diagnosis not present

## 2019-03-12 DIAGNOSIS — Z7982 Long term (current) use of aspirin: Secondary | ICD-10-CM | POA: Insufficient documentation

## 2019-03-12 DIAGNOSIS — R059 Cough, unspecified: Secondary | ICD-10-CM

## 2019-03-12 DIAGNOSIS — R509 Fever, unspecified: Secondary | ICD-10-CM

## 2019-03-12 DIAGNOSIS — Z87891 Personal history of nicotine dependence: Secondary | ICD-10-CM | POA: Insufficient documentation

## 2019-03-12 LAB — CBC WITH DIFFERENTIAL/PLATELET
Abs Immature Granulocytes: 0.04 10*3/uL (ref 0.00–0.07)
Basophils Absolute: 0 10*3/uL (ref 0.0–0.1)
Basophils Relative: 0 %
Eosinophils Absolute: 0.1 10*3/uL (ref 0.0–0.5)
Eosinophils Relative: 1 %
HCT: 37.5 % (ref 36.0–46.0)
Hemoglobin: 12.2 g/dL (ref 12.0–15.0)
Immature Granulocytes: 0 %
Lymphocytes Relative: 8 %
Lymphs Abs: 0.9 10*3/uL (ref 0.7–4.0)
MCH: 30.4 pg (ref 26.0–34.0)
MCHC: 32.5 g/dL (ref 30.0–36.0)
MCV: 93.5 fL (ref 80.0–100.0)
Monocytes Absolute: 0.7 10*3/uL (ref 0.1–1.0)
Monocytes Relative: 7 %
Neutro Abs: 9.5 10*3/uL — ABNORMAL HIGH (ref 1.7–7.7)
Neutrophils Relative %: 84 %
Platelets: 227 10*3/uL (ref 150–400)
RBC: 4.01 MIL/uL (ref 3.87–5.11)
RDW: 13.3 % (ref 11.5–15.5)
WBC: 11.3 10*3/uL — ABNORMAL HIGH (ref 4.0–10.5)
nRBC: 0 % (ref 0.0–0.2)

## 2019-03-12 LAB — URINALYSIS, ROUTINE W REFLEX MICROSCOPIC
Bilirubin Urine: NEGATIVE
Glucose, UA: NEGATIVE mg/dL
Hgb urine dipstick: NEGATIVE
Ketones, ur: NEGATIVE mg/dL
Leukocytes,Ua: NEGATIVE
Nitrite: NEGATIVE
Protein, ur: NEGATIVE mg/dL
Specific Gravity, Urine: 1.011 (ref 1.005–1.030)
pH: 5 (ref 5.0–8.0)

## 2019-03-12 LAB — COMPREHENSIVE METABOLIC PANEL
ALT: 19 U/L (ref 0–44)
AST: 24 U/L (ref 15–41)
Albumin: 3.5 g/dL (ref 3.5–5.0)
Alkaline Phosphatase: 53 U/L (ref 38–126)
Anion gap: 13 (ref 5–15)
BUN: 14 mg/dL (ref 8–23)
CO2: 23 mmol/L (ref 22–32)
Calcium: 8.4 mg/dL — ABNORMAL LOW (ref 8.9–10.3)
Chloride: 104 mmol/L (ref 98–111)
Creatinine, Ser: 0.92 mg/dL (ref 0.44–1.00)
GFR calc Af Amer: >60 mL/min (ref >60)
GFR calc non Af Amer: 53 mL/min — ABNORMAL LOW (ref >60)
Glucose, Bld: 191 mg/dL — ABNORMAL HIGH (ref 70–99)
Potassium: 3.6 mmol/L (ref 3.5–5.1)
Sodium: 140 mmol/L (ref 135–145)
Total Bilirubin: 0.8 mg/dL (ref 0.3–1.2)
Total Protein: 6.2 g/dL — ABNORMAL LOW (ref 6.5–8.1)

## 2019-03-12 LAB — LACTIC ACID, PLASMA
Lactic Acid, Venous: 2.5 mmol/L (ref 0.5–1.9)
Lactic Acid, Venous: 1.7 mmol/L (ref 0.5–1.9)

## 2019-03-12 LAB — SARS CORONAVIRUS 2 BY RT PCR (HOSPITAL ORDER, PERFORMED IN ~~LOC~~ HOSPITAL LAB): SARS Coronavirus 2: NEGATIVE

## 2019-03-12 MED ORDER — SODIUM CHLORIDE 0.9 % IV SOLN
1000.0000 mL | INTRAVENOUS | Status: DC
  Administered 2019-03-12: 1000 mL via INTRAVENOUS

## 2019-03-12 MED ORDER — DOXYCYCLINE HYCLATE 100 MG PO CAPS: 100.0000 mg | ORAL_CAPSULE | Freq: Two times a day (BID) | ORAL | 0 refills | Status: DC

## 2019-03-12 MED ORDER — CEFDINIR 300 MG PO CAPS: 300.0000 mg | ORAL_CAPSULE | Freq: Two times a day (BID) | ORAL | 0 refills | Status: DC

## 2019-03-12 MED ORDER — SODIUM CHLORIDE 0.9 % IV BOLUS
1000.0000 mL | Freq: Once | INTRAVENOUS | Status: AC
  Administered 2019-03-12: 1000 mL via INTRAVENOUS

## 2019-03-12 MED ORDER — ACETAMINOPHEN 325 MG PO TABS
650.0000 mg | ORAL_TABLET | Freq: Once | ORAL | Status: AC
  Administered 2019-03-12: 650 mg via ORAL
  Filled 2019-03-12: qty 2

## 2019-03-12 MED ORDER — SODIUM CHLORIDE 0.9 % IV SOLN
1.0000 g | Freq: Once | INTRAVENOUS | Status: AC
  Administered 2019-03-12: 1 g via INTRAVENOUS
  Filled 2019-03-12: qty 10

## 2019-03-12 MED ORDER — IOHEXOL 300 MG/ML  SOLN
100.0000 mL | Freq: Once | INTRAMUSCULAR | Status: AC | PRN
  Administered 2019-03-12: 100 mL via INTRAVENOUS

## 2019-03-12 NOTE — Discharge Instructions (Addendum)
Stay well-hydrated.  Take antibiotics as directed. Follow-up closely with your primary doctor for reassessment for your infection and to discuss your heart murmur and need for echo cardiogram. Return to the ER for persistent fevers, shortness of breath, worsening general weakness or new concerns. Follow-up blood culture results on Friday.If you were given medicines take as directed.  If you are on coumadin or contraceptives realize their levels and effectiveness is altered by many different medicines.  If you have any reaction (rash, tongues swelling, other) to the medicines stop taking and see a physician.    If your blood pressure was elevated in the ER make sure you follow up for management with a primary doctor or return for chest pain, shortness of breath or stroke symptoms.  Please follow up as directed and return to the ER or see a physician for new or worsening symptoms.  Thank you. Vitals:   03/12/19 1200 03/12/19 1215 03/12/19 1230 03/12/19 1245  BP: (!) 114/56  (!) 114/53   Pulse: 72 77 83   Resp: (!) 25 (!) 23 (!) 24 (!) 24  Temp:      TempSrc:      SpO2: 91% 96% 97%

## 2019-03-12 NOTE — ED Provider Notes (Signed)
[facility] EMERGENCY DEPARTMENT Provider Note   CSN: [id number] Arrival date & time: 03/12/19  0757    History   Chief Complaint Chief Complaint  Patient presents with   Fever    HPI Denise Parrish is a 85 y.o. female.     Patient with history of diabetes, lipids, pneumonia, arthritis history presents with dizziness, fever and cough since yesterday.  No significant sick contacts.  No known contacts with COVID.  Patient did vomit once and feels generally weaker.  Patient does have history of emphysema.  Patient denies home oxygen use.  [place]     Past Medical History:  Diagnosis Date   Anxiety    Aortic sclerosis    Long-standing heart murmur   Chest pain 2006   +palpitations; minimal CAD at cath in 2005; normal EF; negative stress echo in 09/2008   Cholelithiasis    on u/s 09/2011   Degenerative joint disease    Right THA in 1995; cervical discectomy and fusion-2001   Diabetes mellitus, type 2 (HCC)    + neuropathy   Emphysema    Gastroesophageal reflux disease    Hiatal hernia    Hyperlipidemia    Hypertension    IDA (iron deficiency anemia)    labs 09/2011   Obesity    Osteoporosis    Sigmoid diverticulosis    Small bowel lesion    On Given's capsule; Dr Kathleene Hazel 07/2012, no further w/u needed    Patient Active Problem List   Diagnosis Date Noted   RUQ pain    Dehydration    Ileus ([place]) 12/12/2017   Enteritis    Right upper lobe pneumonia ([place]) 12/08/2017   Sepsis ([place]) 12/08/2017   Lactic acidosis 12/08/2017   Anxiety 12/08/2017   Dehydration fever 12/08/2017   Segmental ileitis of small intestine ([place]) 06/16/2015   Diverticulosis of colon without hemorrhage 05/25/2015   SBO (small bowel obstruction) ([place]) 05/23/2015   Nausea and vomiting in adult    Nausea vomiting and diarrhea 09/03/2013   Vomiting 09/03/2013   Transaminitis 09/03/2013   Left leg weakness 10/14/2012     Difficulty in walking(719.7) 10/14/2012   Small bowel lesion 07/09/2012   Heme positive stool 03/14/2012   GERD (gastroesophageal reflux disease) 03/14/2012   Diarrhea 03/14/2012   Right carotid bruit 11/01/2011   Anemia, iron deficiency 10/28/2011   Palpitations 10/16/2011   Diabetes mellitus, type 2 ([place])    Hypertension    Hyperlipidemia    Obesity    Aortic sclerosis     Past Surgical History:  Procedure Laterality Date   ANTERIOR CERVICAL DISCECTOMY  2001   C4-5, allograft, fixation   BREAST LUMPECTOMY     benign   CATARACT EXTRACTION W/ INTRAOCULAR LENS IMPLANT  2007   Left   COLONOSCOPY  2006   COLONOSCOPY  04/03/12   Dr. Ok Edwards diverticulosis, negative microscopic  colitis    ESOPHAGOGASTRODUODENOSCOPY  04/03/12   Dr. Jennet Maduro hernia, chronic gastritis on bx   GIVENS CAPSULE STUDY  05/09/2012   RMR: an unclear raised area of small bowel was noted, with features almost  characteristic of very small polyp. This was without villous  blunting or any evidence of active bleeding; yet, the area of  concern appeared to be erythematous. However, this could simply  be a light reflection on a normal variation of the small bowel. REFERRED TO DR. GILLIAM, APPT FOR NOVEMBER 2013.    TONSILLECTOMY  TOTAL HIP ARTHROPLASTY  1995   Right     OB History   No obstetric history on file.      Home Medications    Prior to Admission medications   Medication Sig Start Date End Date Taking? Authorizing Provider  ALPRAZolam Duanne Moron) 0.5 MG tablet Take 1 tablet (0.5 mg total) by mouth 3 (three) times daily. 12/11/17  Yes Barton Dubois, MD  aspirin EC 81 MG tablet Take 81 mg by mouth daily.    Yes [provider]  glimepiride (AMARYL) 1 MG tablet Take 1 mg by mouth daily.  09/17/17  Yes [provider]  Glycerin-Hypromellose-PEG 400 (VISINE DRY EYE OP) Apply 1 drop to eye 3 (three) times daily.   Yes [provider]  ibuprofen  (ADVIL,MOTRIN) 200 MG tablet Take 2 tablets (400 mg total) by mouth every 8 (eight) hours as needed for headache or moderate pain. 12/11/17  Yes Barton Dubois, MD  magnesium oxide (MAG-OX) 400 MG tablet Take 800 mg by mouth daily.   Yes [provider]  metoprolol tartrate (LOPRESSOR) 25 MG tablet Take 1 tablet by mouth 2 (two) times daily. 07/27/16  Yes [provider]  multivitamin-lutein (OCUVITE-LUTEIN) CAPS capsule Take 1 capsule by mouth daily.   Yes [provider]  omeprazole (PRILOSEC) 20 MG capsule Take 20 mg by mouth Daily. 08/05/12  Yes [provider]  vitamin B-12 (CYANOCOBALAMIN) 500 MCG tablet Take 500 mcg by mouth daily.   Yes [provider]  acetaminophen (TYLENOL) 325 MG tablet Take 325 mg by mouth every 6 (six) hours as needed for moderate pain or headache.    [provider]  cefdinir (OMNICEF) 300 MG capsule Take 1 capsule (300 mg total) by mouth 2 (two) times daily. 03/13/19   Elnora Morrison, MD  doxycycline (VIBRAMYCIN) 100 MG capsule Take 1 capsule (100 mg total) by mouth 2 (two) times daily. One po bid x 7 days 03/12/19   Elnora Morrison, MD  methocarbamol (ROBAXIN) 500 MG tablet Take 1 tablet (500 mg total) by mouth every 8 (eight) hours as needed for muscle spasms. Patient not taking: Reported on 03/12/2019 12/11/17   Barton Dubois, MD    Family History Family History  Problem Relation Age of Onset   Diabetes Mother        + brother   Heart failure Father    Hypertension Father        + brother   Cancer Brother        lung, age 62   Uterine cancer Sister    Colon cancer Neg Hx     Social History Social History   Tobacco Use   Smoking status: Former Smoker    Packs/day: 1.00    Years: 30.00    Pack years: 30.00    Types: Cigarettes    Quit date: 10/15/1970    Years since quitting: 48.4   Smokeless tobacco: Former Systems developer    Quit date: 09/19/1971   Tobacco comment: Quit x 50 years  Substance Use  Topics   Alcohol use: No   Drug use: No     Allergies   Patient has no known allergies.   Review of Systems Review of Systems  Constitutional: Positive for appetite change and fatigue. Negative for chills and fever.  HENT: Positive for congestion.   Eyes: Negative for visual disturbance.  Respiratory: Positive for cough. Negative for shortness of breath.   Cardiovascular: Negative for chest pain.  Gastrointestinal: Positive for nausea and  vomiting. Negative for abdominal pain.  Genitourinary: Negative for dysuria and flank pain.  Musculoskeletal: Negative for back pain, neck pain and neck stiffness.  Skin: Negative for rash.  Neurological: Positive for dizziness and light-headedness. Negative for headaches.     Physical Exam Updated Vital Signs BP (!) 114/53    Pulse 83    Temp 100.3 F (37.9 C) (Oral)    Resp (!) 24    SpO2 97%   Physical Exam Vitals signs and nursing note reviewed.  Constitutional:      Appearance: She is well-developed.  HENT:     Head: Normocephalic and atraumatic.  Eyes:     General:        Right eye: No discharge.        Left eye: No discharge.     Conjunctiva/sclera: Conjunctivae normal.  Neck:     Musculoskeletal: Normal range of motion and neck supple.     Trachea: No tracheal deviation.  Cardiovascular:     Rate and Rhythm: Regular rhythm. Tachycardia present.     Heart sounds: Murmur (upper systolic4+) present.  Pulmonary:     Effort: Pulmonary effort is normal.     Breath sounds: Normal breath sounds.  Abdominal:     General: There is no distension.     Palpations: Abdomen is soft.     Tenderness: There is no abdominal tenderness. There is no guarding.  Skin:    General: Skin is warm.     Findings: No rash.  Neurological:     Mental Status: She is alert and oriented to person, place, and time.  Psychiatric:        Mood and Affect: Mood normal.      ED Treatments / Results  Labs (all labs ordered are listed, but only  abnormal results are displayed) Labs Reviewed  LACTIC ACID, PLASMA - Abnormal; Notable for the following components:      Result Value   Lactic Acid, Venous 2.5 (*)    All other components within normal limits  COMPREHENSIVE METABOLIC PANEL - Abnormal; Notable for the following components:   Glucose, Bld 191 (*)    Calcium 8.4 (*)    Total Protein 6.2 (*)    GFR calc non Af Amer 53 (*)    All other components within normal limits  CBC WITH DIFFERENTIAL/PLATELET - Abnormal; Notable for the following components:   WBC 11.3 (*)    Neutro Abs 9.5 (*)    All other components within normal limits  SARS CORONAVIRUS 2 (HOSPITAL ORDER, [place] [facility])  CULTURE, BLOOD (ROUTINE X 2)  CULTURE, BLOOD (ROUTINE X 2)  LACTIC ACID, PLASMA  URINALYSIS, ROUTINE W REFLEX MICROSCOPIC    EKG None  Radiology Ct Abdomen Pelvis W Contrast  Result Date: 03/12/2019 CLINICAL DATA:  Fever, chills and nausea. Patient presented to the emergency room yesterday with shakiness and dizziness. EXAM: CT ABDOMEN AND PELVIS WITH CONTRAST TECHNIQUE: Multidetector CT imaging of the abdomen and pelvis was performed using the standard protocol following bolus administration of intravenous contrast. CONTRAST:  118m OMNIPAQUE IOHEXOL 300 MG/ML  SOLN COMPARISON:  Abdominopelvic CT 12/12/2017.  Ultrasound 12/12/2017. FINDINGS: Lower chest: The previously demonstrated small pleural effusions have resolved. There is stable linear scarring at the right lung base, a small hiatal hernia and mild distal esophageal wall thickening. Hepatobiliary: The liver is normal in density without suspicious focal abnormality. Cholelithiasis again noted without gallbladder wall thickening or biliary dilatation. Pancreas: Unremarkable. No pancreatic ductal dilatation  or surrounding inflammatory changes. Spleen: Normal in size without focal abnormality. Adrenals/Urinary Tract: Stable mild thickening of the left adrenal gland. The  right adrenal gland appears normal. The kidneys appear stable with cortical thinning and atrophy on the left and bilateral extrarenal pelves. There are simple and probable complex renal cysts bilaterally which have not significantly changed in size compared with the prior study. No evidence of urinary tract calculus or hydronephrosis. The bladder appears normal. Stomach/Bowel: The stomach and proximal small bowel appear normal. A few mildly dilated loops of fluid-filled small bowel are again noted in the left mid abdomen, improved from the prior study. There is no associated bowel thickening. The distal small bowel, appendix and colon demonstrate no acute findings. There are diverticular changes throughout descending and sigmoid colon. Vascular/Lymphatic: There are no enlarged abdominal or pelvic lymph nodes. There is aortic and branch vessel atherosclerosis without acute vascular findings. Reproductive: The uterus and ovaries appear normal. No adnexal mass. Other: Previously demonstrated mesenteric edema and ascites have resolved. No abdominal wall hernia or free air. Musculoskeletal: No acute or significant osseous findings. Multilevel spondylosis post right total hip arthroplasty. IMPRESSION: 1. Mild small bowel dilatation in the left mid abdomen, improved from previous study of 15 months ago and without surrounding inflammation. Findings could relate to adhesions or recurrent enteritis. No high-grade bowel obstruction, hernia or bowel perforation identified. 2. Distal colonic diverticulosis without acute inflammation. 3. Cholelithiasis without evidence of cholecystitis or biliary dilatation. 4. Stable probable simple and complex renal cysts with left renal cortical thinning. 5.  Aortic Atherosclerosis (ICD10-I70.0). Electronically Signed   By: Richardean Sale M.D.   On: 03/12/2019 13:34   Dg Chest Portable 1 View  Result Date: 03/12/2019 CLINICAL DATA:  Fever today. Dizziness yesterday. EXAM: PORTABLE CHEST  1 VIEW COMPARISON:  12/11/2017 FINDINGS: The cardiomediastinal silhouette is unchanged with normal heart size. Aortic atherosclerosis is noted. There is mild chronic coarsening of the interstitial markings and elevation of the right minor fissure. No acute airspace consolidation, overt pulmonary edema, sizable pleural effusion, pneumothorax is identified. No acute osseous abnormality is seen. IMPRESSION: No evidence of acute cardiopulmonary process. Electronically Signed   By: Logan Bores M.D.   On: 03/12/2019 09:18   Dg Abd Portable 1 View  Result Date: 03/12/2019 CLINICAL DATA:  Episode of dizziness yesterday.  Fever this morning. EXAM: PORTABLE ABDOMEN - 1 VIEW COMPARISON:  Plain films chest and abdomen 12/11/2017. FINDINGS: Mildly dilated loop of small bowel in the left abdomen measures 4.5 cm. There is gas and stool in the colon. No abnormal abdominal calcification is seen. Lumbar degenerative disease right hip replacement noted. IMPRESSION: Single dilated loop of small bowel in the left abdomen with gas and stool in the colon could be due to focal inflammatory process in the left abdomen or obstruction. Electronically Signed   By: Inge Rise M.D.   On: 03/12/2019 09:52    Procedures .Critical Care Performed by: Elnora Morrison, MD Authorized by: Elnora Morrison, MD   Critical care provider statement:    Critical care time (minutes):  40   Critical care start time:  03/12/2019 9:00 AM   Critical care end time:  03/12/2019 9:40 AM   Critical care time was exclusive of:  Separately billable procedures and treating other patients and teaching time   Critical care was necessary to treat or prevent imminent or life-threatening deterioration of the following conditions:  Sepsis   Critical care was time spent personally by me on the following activities:  Evaluation of patient's response to treatment, examination of patient, ordering and review of laboratory studies, ordering and review of  radiographic studies, pulse oximetry, re-evaluation of patient's condition and obtaining history from patient or surrogate   (including critical care time)  Medications Ordered in ED Medications  0.9 %  sodium chloride infusion (0 mLs Intravenous Stopped 03/12/19 1127)  cefTRIAXone (ROCEPHIN) 1 g in sodium chloride 0.9 % 100 mL IVPB (0 g Intravenous Stopped 03/12/19 1008)  sodium chloride 0.9 % bolus 1,000 mL (0 mLs Intravenous Stopped 03/12/19 1008)  acetaminophen (TYLENOL) tablet 650 mg (650 mg Oral Given 03/12/19 1234)  iohexol (OMNIPAQUE) 300 MG/ML solution 100 mL (100 mLs Intravenous Contrast Given 03/12/19 1212)     Initial Impression / Assessment and Plan / ED Course  I have reviewed the triage vital signs and the nursing notes.  Pertinent labs & imaging results that were available during my care of the patient were reviewed by me and considered in my medical decision making (see chart for details).       Elderly patient healthy for her age presents with fever dizziness and cough.  Discussed concern for pneumonia either viral COVID versus bacterial.  With age, tachycardia, fever a sepsis screening initiated.  Blood cultures and urine culture ordered along with Rocephin antibiotics.  Chest x-ray pending IV fluid bolus ordered. Patient's initial lactate mild elevated.  Continue fluids.  Heart rate improved.  With abdominal distention and fever no definitive source plan for CT scan of the abdomen pelvis urinalysis pending.  Blood work reviewed mild leukocytosis 11.3, normal creatinine. COVID test negative.  Patient tolerated full meal and oral fluids.  Patient feeling minimal symptoms and overall feels well.  Patient hopeful to go home.  I discussed with patient's daughter the results concerns and importance of close outpatient follow-up on Friday or to return to the emergency room for worsening or new symptoms.  Patient received Rocephin here plan for third-generation cephalosporin and  doxycycline outpatient in case early pneumonia with cough and fever.  Stressed importance of oral fluids.  Lactic acid improved in the ER after fluid bolus.  CT scan results did not show source of any infection, mild dilation improved compared to previous.  Tran P Reader was evaluated in Emergency Department on 03/12/2019 for the symptoms described in the history of present illness. She was evaluated in the context of the global COVID-19 pandemic, which necessitated consideration that the patient might be at risk for infection with the SARS-CoV-2 virus that causes COVID-19. Institutional protocols and algorithms that pertain to the evaluation of patients at risk for COVID-19 are in a state of rapid change based on information released by regulatory bodies including the CDC and federal and state organizations. These policies and algorithms were followed during the patient's care in the ED.     Final Clinical Impressions(s) / ED Diagnoses   Final diagnoses:  Fever in adult  Aortic heart murmur  Near syncope  Cough in adult    ED Discharge Orders         Ordered    cefdinir (OMNICEF) 300 MG capsule  2 times daily     03/12/19 1405    doxycycline (VIBRAMYCIN) 100 MG capsule  2 times daily     03/12/19 1405           Elnora Morrison, MD 03/12/19 1407

## 2019-03-12 NOTE — ED Notes (Signed)
Date and time results received: 03/12/19 0955 (use smartphrase ".now" to insert current time)  Test: lac acid Critical Value: 2.5  Name of Provider Notified: [email or web address]  Orders Received? Or Actions Taken?: see chart

## 2019-03-12 NOTE — ED Triage Notes (Signed)
Pt states that yesterday she though that she had inner ear because she was dizzy and everything was spinning. She had a fever this morning denies another s/s

## 2019-03-17 LAB — CULTURE, BLOOD (ROUTINE X 2)
Culture: NO GROWTH
Culture: NO GROWTH

## 2019-05-21 ENCOUNTER — Ambulatory Visit: Payer: Medicare Other | Admitting: Cardiovascular Disease

## 2019-06-21 NOTE — Progress Notes (Signed)
CARDIOLOGY CONSULT NOTE       Patient ID: Denise Parrish MRN: [id number] DOB/AGE: [birth year] 85 y.o.  Admit date: (Not on file) Referring Physician: Karie Kirks Primary Physician: Lemmie Evens, MD Primary Cardiologist: New Reason for Consultation: Murmur   Active Problems:   * No active hospital problems. *   HPI:  85 y.o. previously seen by Dr Lattie Haw in 2013 with palpitations no arrhythmia noted Thought due to stress From alcoholic son. Referred by Dr Karie Kirks for murmur Known mild AS by echo in 2013 with mean gradient 14 mmHg peak 25 mmHg Has not had any f/u for this Suspect she had referred murmur to neck and carotids showed <50% bilateral stenosis . Seen in ER June with ? Infection enteritis Rx with rocephin and fluids. CT scan with diverticulosis  CXR NAD WBC 11.3 eventually d/c home Urine and Covid tests negative as well  She does not complain of chest pain. More dyspnea this past year One episode of dizziness a couple of months Ago ? Vertigo no frank syncope   She is active for her age Lives independently   ROS All other systems reviewed and negative except as noted above  Past Medical History:  Diagnosis Date  . Anxiety   . Aortic sclerosis    Long-standing heart murmur  . Chest pain 2006   +palpitations; minimal CAD at cath in 2005; normal EF; negative stress echo in 09/2008  . Cholelithiasis    on u/s 09/2011  . Degenerative joint disease    Right THA in 1995; cervical discectomy and fusion-2001  . Diabetes mellitus, type 2 (HCC)    + neuropathy  . Emphysema   . Gastroesophageal reflux disease   . Hiatal hernia   . Hyperlipidemia   . Hypertension   . IDA (iron deficiency anemia)    labs 09/2011  . Obesity   . Osteoporosis   . Sigmoid diverticulosis   . Small bowel lesion    On Given's capsule; Dr Kathleene Hazel 07/2012, no further w/u needed    Family History  Problem Relation Age of Onset  . Diabetes Mother        + brother  . Heart failure  Father   . Hypertension Father        + brother  . Cancer Brother        lung, age 69  . Uterine cancer Sister   . Colon cancer Neg Hx     Social History   Socioeconomic History  . Marital status: Divorced    Spouse name: Not on file  . Number of children: 4  . Years of education: Not on file  . Highest education level: Not on file  Occupational History  . Occupation: Retired    Comment: [employer]  Social Needs  . Financial resource strain: Not on file  . Food insecurity    Worry: Not on file    Inability: Not on file  . Transportation needs    Medical: Not on file    Non-medical: Not on file  Tobacco Use  . Smoking status: Former Smoker    Packs/day: 1.00    Years: 30.00    Pack years: 30.00    Types: Cigarettes    Quit date: 10/15/1970    Years since quitting: 48.7  . Smokeless tobacco: Former Systems developer    Quit date: 09/19/1971  . Tobacco comment: Quit x 50 years  Substance and Sexual Activity  . Alcohol use: No  . Drug use: No  .  Sexual activity: Never  Lifestyle  . Physical activity    Days per week: Not on file    Minutes per session: Not on file  . Stress: Not on file  Relationships  . Social Herbalist on phone: Not on file    Gets together: Not on file    Attends religious service: Not on file    Active member of club or organization: Not on file    Attends meetings of clubs or organizations: Not on file    Relationship status: Not on file  . Intimate partner violence    Fear of current or ex partner: Not on file    Emotionally abused: Not on file    Physically abused: Not on file    Forced sexual activity: Not on file  Other Topics Concern  . Not on file  Social History Narrative  . Not on file    Past Surgical History:  Procedure Laterality Date  . ANTERIOR CERVICAL DISCECTOMY  2001   C4-5, allograft, fixation  . BREAST LUMPECTOMY     benign  . CATARACT EXTRACTION W/ INTRAOCULAR LENS IMPLANT  2007   Left  . COLONOSCOPY  2006  .  COLONOSCOPY  04/03/12   Dr. Ok Edwards diverticulosis, negative microscopic  colitis   . ESOPHAGOGASTRODUODENOSCOPY  04/03/12   Dr. Jennet Maduro hernia, chronic gastritis on bx  . GIVENS CAPSULE STUDY  05/09/2012   RMR: an unclear raised area of small bowel was noted, with features almost  characteristic of very small polyp. This was without villous  blunting or any evidence of active bleeding; yet, the area of  concern appeared to be erythematous. However, this could simply  be a light reflection on a normal variation of the small bowel. REFERRED TO DR. GILLIAM, APPT FOR NOVEMBER 2013.   . TONSILLECTOMY    . TOTAL HIP ARTHROPLASTY  1995   Right        Physical Exam: Blood pressure 118/62, pulse 94, temperature (!) 97.5 F (36.4 C), temperature source Temporal, height 5\' 5"  (1.651 m), weight 181 lb (82.1 kg), SpO2 96 %.    Affect appropriate Healthy:  appears stated age 85: normal Neck supple with no adenopathy JVP normal no bruits no thyromegaly Lungs clear with no wheezing and good diaphragmatic motion Heart:  S1/S2 AS  murmur, no rub, gallop or click PMI normal Abdomen: benighn, BS positve, no tenderness, no AAA no bruit.  No HSM or HJR Distal pulses intact with no bruits No edema Neuro non-focal Skin warm and dry No muscular weakness   Labs:   Lab Results  Component Value Date   WBC 11.3 (H) 03/12/2019   HGB 12.2 03/12/2019   HCT 37.5 03/12/2019   MCV 93.5 03/12/2019   PLT 227 03/12/2019   No results for input(s): NA, K, CL, CO2, BUN, CREATININE, CALCIUM, PROT, BILITOT, ALKPHOS, ALT, AST, GLUCOSE in the last 168 hours.  Invalid input(s): LABALBU Lab Results  Component Value Date   TROPONINI <0.03 12/08/2017   No results found for: CHOL No results found for: HDL No results found for: LDLCALC No results found for: TRIG No results found for: CHOLHDL No results found for: LDLDIRECT    Radiology: No results found.  EKG: 12/10/17 SR rate 99 nonspecific ST  changes    ASSESSMENT AND PLAN:   1. Murmur/AS:  Likely progressive aortic stenosis known mild 7 years ago with mean gradient of 14 mmHg will update echo If as is likely she has  severe AS will refer to Dr Burt Knack for consideration of TAVR Despite her age if she has critical AS I think she would be a candidate on initial impression. Obviously at age 74 she is not an open surgical candidate  2. HTN:  Well controlled.  Continue current medications and low sodium Dash type diet.   3.  DM:  Discussed low carb diet.  Target hemoglobin A1c is 6.5 or less.  Continue current medications.   SignedJenkins Rouge 06/24/2019, 2:54 PM

## 2019-06-24 ENCOUNTER — Other Ambulatory Visit: Payer: Self-pay

## 2019-06-24 ENCOUNTER — Ambulatory Visit (INDEPENDENT_AMBULATORY_CARE_PROVIDER_SITE_OTHER): Payer: Medicare Other | Admitting: Cardiovascular Disease

## 2019-06-24 ENCOUNTER — Encounter: Payer: Self-pay | Admitting: Cardiovascular Disease

## 2019-06-24 VITALS — BP 118/62 | HR 94 | Temp 97.5°F | Ht 65.0 in | Wt 181.0 lb

## 2019-06-24 DIAGNOSIS — R011 Cardiac murmur, unspecified: Secondary | ICD-10-CM | POA: Diagnosis not present

## 2019-06-24 DIAGNOSIS — I350 Nonrheumatic aortic (valve) stenosis: Secondary | ICD-10-CM

## 2019-06-24 NOTE — Patient Instructions (Signed)
Medication Instructions:  Your physician recommends that you continue on your current medications as directed. Please refer to the Current Medication list given to you today.   Labwork: none  Testing/Procedures: Your physician has requested that you have an echocardiogram. Echocardiography is a painless test that uses sound waves to create images of your heart. It provides your doctor with information about the size and shape of your heart and how well your heart's chambers and valves are working. This procedure takes approximately one hour. There are no restrictions for this procedure.    Follow-Up: Your physician recommends that you schedule a follow-up appointment in:  To be determined based on test    Any Other Special Instructions Will Be Listed Below (If Applicable).     If you need a refill on your cardiac medications before your next appointment, please call your pharmacy.

## 2019-06-30 ENCOUNTER — Other Ambulatory Visit: Payer: Self-pay

## 2019-06-30 ENCOUNTER — Telehealth: Payer: Self-pay

## 2019-06-30 ENCOUNTER — Ambulatory Visit (HOSPITAL_COMMUNITY)
Admission: RE | Admit: 2019-06-30 | Discharge: 2019-06-30 | Disposition: A | Discharge status: Home / Self Care | Payer: Medicare Other | Source: Ambulatory Visit | Attending: Cardiovascular Disease | Admitting: Cardiovascular Disease

## 2019-06-30 DIAGNOSIS — I350 Nonrheumatic aortic (valve) stenosis: Secondary | ICD-10-CM | POA: Insufficient documentation

## 2019-06-30 NOTE — Telephone Encounter (Signed)
-----   Message from Josue Hector, MD sent at 06/30/2019  3:53 PM EDT ----- Moderate AS not severe f/u echo in a year

## 2019-06-30 NOTE — Telephone Encounter (Signed)
Spoke with pt and then called daughter. Both informed and voiced understanding.

## 2019-06-30 NOTE — Progress Notes (Signed)
*  PRELIMINARY RESULTS* Echocardiogram 2D Echocardiogram has been performed.  Denise Parrish 06/30/2019, 2:48 PM

## 2019-10-05 ENCOUNTER — Encounter (HOSPITAL_COMMUNITY): Payer: Self-pay | Admitting: *Deleted

## 2019-10-05 ENCOUNTER — Other Ambulatory Visit: Payer: Self-pay

## 2019-10-05 ENCOUNTER — Observation Stay (HOSPITAL_COMMUNITY): Payer: Medicare Other

## 2019-10-05 ENCOUNTER — Emergency Department (HOSPITAL_COMMUNITY): Payer: Medicare Other

## 2019-10-05 ENCOUNTER — Inpatient Hospital Stay (HOSPITAL_COMMUNITY)
Admission: EM | Admit: 2019-10-05 | Discharge: 2019-10-07 | DRG: 641 | Disposition: A | Discharge status: Home / Self Care | Payer: Medicare Other | Attending: Internal Medicine | Admitting: Internal Medicine

## 2019-10-05 DIAGNOSIS — Z7982 Long term (current) use of aspirin: Secondary | ICD-10-CM

## 2019-10-05 DIAGNOSIS — I10 Essential (primary) hypertension: Secondary | ICD-10-CM | POA: Diagnosis present

## 2019-10-05 DIAGNOSIS — Z96641 Presence of right artificial hip joint: Secondary | ICD-10-CM | POA: Diagnosis present

## 2019-10-05 DIAGNOSIS — E785 Hyperlipidemia, unspecified: Secondary | ICD-10-CM | POA: Diagnosis present

## 2019-10-05 DIAGNOSIS — Z833 Family history of diabetes mellitus: Secondary | ICD-10-CM

## 2019-10-05 DIAGNOSIS — Z20822 Contact with and (suspected) exposure to covid-19: Secondary | ICD-10-CM | POA: Diagnosis present

## 2019-10-05 DIAGNOSIS — R1110 Vomiting, unspecified: Secondary | ICD-10-CM

## 2019-10-05 DIAGNOSIS — J439 Emphysema, unspecified: Secondary | ICD-10-CM | POA: Diagnosis present

## 2019-10-05 DIAGNOSIS — K219 Gastro-esophageal reflux disease without esophagitis: Secondary | ICD-10-CM | POA: Diagnosis present

## 2019-10-05 DIAGNOSIS — N179 Acute kidney failure, unspecified: Secondary | ICD-10-CM | POA: Diagnosis present

## 2019-10-05 DIAGNOSIS — E119 Type 2 diabetes mellitus without complications: Secondary | ICD-10-CM

## 2019-10-05 DIAGNOSIS — R0902 Hypoxemia: Secondary | ICD-10-CM | POA: Diagnosis present

## 2019-10-05 DIAGNOSIS — K2970 Gastritis, unspecified, without bleeding: Secondary | ICD-10-CM | POA: Diagnosis present

## 2019-10-05 DIAGNOSIS — K529 Noninfective gastroenteritis and colitis, unspecified: Secondary | ICD-10-CM | POA: Diagnosis present

## 2019-10-05 DIAGNOSIS — Z981 Arthrodesis status: Secondary | ICD-10-CM

## 2019-10-05 DIAGNOSIS — Z79899 Other long term (current) drug therapy: Secondary | ICD-10-CM

## 2019-10-05 DIAGNOSIS — R109 Unspecified abdominal pain: Secondary | ICD-10-CM

## 2019-10-05 DIAGNOSIS — Z8049 Family history of malignant neoplasm of other genital organs: Secondary | ICD-10-CM

## 2019-10-05 DIAGNOSIS — Z7984 Long term (current) use of oral hypoglycemic drugs: Secondary | ICD-10-CM

## 2019-10-05 DIAGNOSIS — R112 Nausea with vomiting, unspecified: Secondary | ICD-10-CM

## 2019-10-05 DIAGNOSIS — Z8249 Family history of ischemic heart disease and other diseases of the circulatory system: Secondary | ICD-10-CM

## 2019-10-05 DIAGNOSIS — Z87891 Personal history of nicotine dependence: Secondary | ICD-10-CM

## 2019-10-05 DIAGNOSIS — E876 Hypokalemia: Secondary | ICD-10-CM | POA: Diagnosis present

## 2019-10-05 DIAGNOSIS — E860 Dehydration: Principal | ICD-10-CM

## 2019-10-05 DIAGNOSIS — K567 Ileus, unspecified: Secondary | ICD-10-CM | POA: Diagnosis present

## 2019-10-05 DIAGNOSIS — F419 Anxiety disorder, unspecified: Secondary | ICD-10-CM | POA: Diagnosis present

## 2019-10-05 DIAGNOSIS — M810 Age-related osteoporosis without current pathological fracture: Secondary | ICD-10-CM | POA: Diagnosis present

## 2019-10-05 LAB — URINALYSIS, ROUTINE W REFLEX MICROSCOPIC
Bilirubin Urine: NEGATIVE
Glucose, UA: NEGATIVE mg/dL
Hgb urine dipstick: NEGATIVE
Ketones, ur: 20 mg/dL — AB
Nitrite: NEGATIVE
Protein, ur: 30 mg/dL — AB
Specific Gravity, Urine: >1.046 — ABNORMAL HIGH (ref 1.005–1.030)
pH: 9 — ABNORMAL HIGH (ref 5.0–8.0)

## 2019-10-05 LAB — COMPREHENSIVE METABOLIC PANEL
ALT: 25 U/L (ref 0–44)
AST: 34 U/L (ref 15–41)
Albumin: 4.2 g/dL (ref 3.5–5.0)
Alkaline Phosphatase: 73 U/L (ref 38–126)
Anion gap: 15 (ref 5–15)
BUN: 12 mg/dL (ref 8–23)
CO2: 27 mmol/L (ref 22–32)
Calcium: 9.4 mg/dL (ref 8.9–10.3)
Chloride: 97 mmol/L — ABNORMAL LOW (ref 98–111)
Creatinine, Ser: 1.24 mg/dL — ABNORMAL HIGH (ref 0.44–1.00)
GFR calc Af Amer: 43 mL/min — ABNORMAL LOW (ref >60)
GFR calc non Af Amer: 37 mL/min — ABNORMAL LOW (ref >60)
Glucose, Bld: 169 mg/dL — ABNORMAL HIGH (ref 70–99)
Potassium: 3.3 mmol/L — ABNORMAL LOW (ref 3.5–5.1)
Sodium: 139 mmol/L (ref 135–145)
Total Bilirubin: 1.6 mg/dL — ABNORMAL HIGH (ref 0.3–1.2)
Total Protein: 7.6 g/dL (ref 6.5–8.1)

## 2019-10-05 LAB — CBC WITH DIFFERENTIAL/PLATELET
Abs Immature Granulocytes: 0.05 10*3/uL (ref 0.00–0.07)
Basophils Absolute: 0 10*3/uL (ref 0.0–0.1)
Basophils Relative: 0 %
Eosinophils Absolute: 0 10*3/uL (ref 0.0–0.5)
Eosinophils Relative: 0 %
HCT: 44.5 % (ref 36.0–46.0)
Hemoglobin: 14.9 g/dL (ref 12.0–15.0)
Immature Granulocytes: 0 %
Lymphocytes Relative: 15 %
Lymphs Abs: 1.8 10*3/uL (ref 0.7–4.0)
MCH: 30.8 pg (ref 26.0–34.0)
MCHC: 33.5 g/dL (ref 30.0–36.0)
MCV: 92.1 fL (ref 80.0–100.0)
Monocytes Absolute: 1 10*3/uL (ref 0.1–1.0)
Monocytes Relative: 8 %
Neutro Abs: 9.2 10*3/uL — ABNORMAL HIGH (ref 1.7–7.7)
Neutrophils Relative %: 77 %
Platelets: 307 10*3/uL (ref 150–400)
RBC: 4.83 MIL/uL (ref 3.87–5.11)
RDW: 12.6 % (ref 11.5–15.5)
WBC: 12 10*3/uL — ABNORMAL HIGH (ref 4.0–10.5)
nRBC: 0 % (ref 0.0–0.2)

## 2019-10-05 LAB — RESPIRATORY PANEL BY RT PCR (FLU A&B, COVID)
Influenza A by PCR: NEGATIVE
Influenza B by PCR: NEGATIVE
SARS Coronavirus 2 by RT PCR: NEGATIVE

## 2019-10-05 LAB — LACTIC ACID, PLASMA: Lactic Acid, Venous: 1.3 mmol/L (ref 0.5–1.9)

## 2019-10-05 LAB — GLUCOSE, CAPILLARY
Glucose-Capillary: 134 mg/dL — ABNORMAL HIGH (ref 70–99)
Glucose-Capillary: 136 mg/dL — ABNORMAL HIGH (ref 70–99)

## 2019-10-05 LAB — LIPASE, BLOOD: Lipase: 22 U/L (ref 11–51)

## 2019-10-05 LAB — MAGNESIUM: Magnesium: 1.9 mg/dL (ref 1.7–2.4)

## 2019-10-05 MED ORDER — POTASSIUM CHLORIDE IN NACL 20-0.9 MEQ/L-% IV SOLN: INTRAVENOUS | Status: AC

## 2019-10-05 MED ORDER — LABETALOL HCL 5 MG/ML IV SOLN: 5.0000 mg | INTRAVENOUS | Status: DC | PRN

## 2019-10-05 MED ORDER — ACETAMINOPHEN 650 MG RE SUPP: 650.0000 mg | Freq: Four times a day (QID) | RECTAL | Status: DC | PRN

## 2019-10-05 MED ORDER — ENOXAPARIN SODIUM 80 MG/0.8ML ~~LOC~~ SOLN
80.0000 mg | SUBCUTANEOUS | Status: DC
  Administered 2019-10-05: 80 mg via SUBCUTANEOUS
  Filled 2019-10-05: qty 0.8

## 2019-10-05 MED ORDER — ONDANSETRON HCL 4 MG/2ML IJ SOLN
4.0000 mg | Freq: Once | INTRAMUSCULAR | Status: AC
  Administered 2019-10-05: 4 mg via INTRAVENOUS
  Filled 2019-10-05: qty 2

## 2019-10-05 MED ORDER — POTASSIUM CHLORIDE CRYS ER 20 MEQ PO TBCR
40.0000 meq | EXTENDED_RELEASE_TABLET | Freq: Once | ORAL | Status: AC
  Administered 2019-10-05: 40 meq via ORAL
  Filled 2019-10-05: qty 2

## 2019-10-05 MED ORDER — IOHEXOL 300 MG/ML  SOLN
100.0000 mL | Freq: Once | INTRAMUSCULAR | Status: AC | PRN
  Administered 2019-10-05: 75 mL via INTRAVENOUS

## 2019-10-05 MED ORDER — ACETAMINOPHEN 325 MG PO TABS: 650.0000 mg | ORAL_TABLET | Freq: Four times a day (QID) | ORAL | Status: DC | PRN

## 2019-10-05 MED ORDER — ENOXAPARIN SODIUM 40 MG/0.4ML ~~LOC~~ SOLN: 40.0000 mg | SUBCUTANEOUS | Status: DC

## 2019-10-05 MED ORDER — METOPROLOL TARTRATE 50 MG PO TABS
25.0000 mg | ORAL_TABLET | Freq: Two times a day (BID) | ORAL | Status: DC
  Administered 2019-10-05 – 2019-10-07 (×2): 25 mg via ORAL
  Filled 2019-10-05 (×3): qty 1

## 2019-10-05 MED ORDER — ASPIRIN EC 81 MG PO TBEC
81.0000 mg | DELAYED_RELEASE_TABLET | Freq: Every day | ORAL | Status: DC
  Administered 2019-10-06 – 2019-10-07 (×2): 81 mg via ORAL
  Filled 2019-10-05 (×2): qty 1

## 2019-10-05 MED ORDER — POLYETHYLENE GLYCOL 3350 17 G PO PACK: 17.0000 g | PACK | Freq: Every day | ORAL | Status: DC | PRN

## 2019-10-05 MED ORDER — SODIUM CHLORIDE 0.9 % IV SOLN: Freq: Once | INTRAVENOUS | Status: AC

## 2019-10-05 MED ORDER — ALPRAZOLAM 0.5 MG PO TABS
0.5000 mg | ORAL_TABLET | Freq: Three times a day (TID) | ORAL | Status: DC
  Administered 2019-10-05 – 2019-10-07 (×5): 0.5 mg via ORAL
  Filled 2019-10-05 (×5): qty 1

## 2019-10-05 MED ORDER — FENTANYL CITRATE (PF) 100 MCG/2ML IJ SOLN
12.5000 ug | Freq: Once | INTRAMUSCULAR | Status: AC
  Administered 2019-10-05: 12.5 ug via INTRAVENOUS
  Filled 2019-10-05: qty 2

## 2019-10-05 MED ORDER — SODIUM CHLORIDE 0.9 % IV BOLUS
1000.0000 mL | Freq: Once | INTRAVENOUS | Status: AC
  Administered 2019-10-05: 1000 mL via INTRAVENOUS

## 2019-10-05 MED ORDER — PANTOPRAZOLE SODIUM 40 MG PO TBEC
40.0000 mg | DELAYED_RELEASE_TABLET | Freq: Every day | ORAL | Status: DC
  Administered 2019-10-05 – 2019-10-06 (×2): 40 mg via ORAL
  Filled 2019-10-05 (×2): qty 1

## 2019-10-05 MED ORDER — INSULIN ASPART 100 UNIT/ML ~~LOC~~ SOLN
0.0000 [IU] | SUBCUTANEOUS | Status: DC
  Administered 2019-10-05 – 2019-10-06 (×2): 1 [IU] via SUBCUTANEOUS

## 2019-10-05 MED ORDER — PROMETHAZINE HCL 12.5 MG PO TABS: 12.5000 mg | ORAL_TABLET | Freq: Four times a day (QID) | ORAL | Status: DC | PRN

## 2019-10-05 NOTE — ED Notes (Signed)
Call to daughter [name] [phone]   She is updated as to admit

## 2019-10-05 NOTE — ED Notes (Signed)
Pt pulse ox noted to go from 97 to 76 per cent on room air  Roy at 2 L to pt with return of sats of 99 per cent

## 2019-10-05 NOTE — ED Notes (Signed)
Pt requested something to drink  Given small amount of ginger ale and ice chips  Vomited shortly afterwards   meds for complaint of pain and for N/V  Gown and linens channged

## 2019-10-05 NOTE — H&P (Addendum)
History and Physical    Denise Parrish IWL:[id number] DOB: 06-27-1923 DOA: 10/05/2019  PCP: Lemmie Evens, MD   Patient coming from: Home  I have personally briefly reviewed patient's old medical records in [place]  Chief Complaint: Vomiting, fever  HPI: Denise Parrish is a 86 y.o. female with medical history significant for DM, HTN, HLD, Anxiety. Patient presented to the Ed with c/o nausea, vomiting, subjective fevers that started last night.  She reports multiple episodes of vomiting since last night till this afternoon.  She also reports a few episodes of loose stools yesterday.  No bowel movements today.  No abdominal pain. She reports upper epigastric/lower chest pain, describes a burning sensation, similar to her usual heartburn pain, that usually resolves with her omeprazole.  She did not take her omeprazole today due to her symptoms.  She reports chronic difficulty breathing only with exertion.  At this time she has no difficulty breathing.  She denies cough.  She denies prior history of blood clots in her lungs or legs.  No leg swelling redness or pain.  ED Course: Intermittent tachycardia to 116, O2 sats initially 98 on room air dropped to 76% and improved on 2L o2. WBC 12. UA with rare bacteria and trace leuks. Cr 1.24. Ct abd With contrast Mild enteritis with associated ileus. 1L bolus given, with advanced age and concern for dehydration, hospitalist was called to admit for further evaluation and management.  Review of Systems: As per HPI all other systems reviewed and negative.  Past Medical History:  Diagnosis Date  . Anxiety   . Aortic sclerosis    Long-standing heart murmur  . Chest pain 2006   +palpitations; minimal CAD at cath in 2005; normal EF; negative stress echo in 09/2008  . Cholelithiasis    on u/s 09/2011  . Degenerative joint disease    Right THA in 1995; cervical discectomy and fusion-2001  . Diabetes mellitus, type 2 (HCC)    + neuropathy    . Emphysema   . Gastroesophageal reflux disease   . Hiatal hernia   . Hyperlipidemia   . Hypertension   . IDA (iron deficiency anemia)    labs 09/2011  . Obesity   . Osteoporosis   . Sigmoid diverticulosis   . Small bowel lesion    On Given's capsule; Dr Kathleene Hazel 07/2012, no further w/u needed    Past Surgical History:  Procedure Laterality Date  . ANTERIOR CERVICAL DISCECTOMY  2001   C4-5, allograft, fixation  . BREAST LUMPECTOMY     benign  . CATARACT EXTRACTION W/ INTRAOCULAR LENS IMPLANT  2007   Left  . COLONOSCOPY  2006  . COLONOSCOPY  04/03/12   Dr. Ok Edwards diverticulosis, negative microscopic  colitis   . ESOPHAGOGASTRODUODENOSCOPY  04/03/12   Dr. Jennet Maduro hernia, chronic gastritis on bx  . GIVENS CAPSULE STUDY  05/09/2012   RMR: an unclear raised area of small bowel was noted, with features almost  characteristic of very small polyp. This was without villous  blunting or any evidence of active bleeding; yet, the area of  concern appeared to be erythematous. However, this could simply  be a light reflection on a normal variation of the small bowel. REFERRED TO DR. GILLIAM, APPT FOR NOVEMBER 2013.   . TONSILLECTOMY    . TOTAL HIP ARTHROPLASTY  1995   Right     reports that she quit smoking about 49 years ago. Her smoking use included cigarettes. She has a 30.00  pack-year smoking history. She quit smokeless tobacco use about 48 years ago. She reports that she does not drink alcohol or use drugs.  No Known Allergies  Family History  Problem Relation Age of Onset  . Diabetes Mother        + brother  . Heart failure Father   . Hypertension Father        + brother  . Cancer Brother        lung, age 79  . Uterine cancer Sister   . Colon cancer Neg Hx     Prior to Admission medications   Medication Sig Start Date End Date Taking? Authorizing Provider  acetaminophen (TYLENOL) 325 MG tablet Take 325 mg by mouth every 6 (six) hours as needed for  moderate pain or headache.    [provider]  ALPRAZolam Duanne Moron) 0.5 MG tablet Take 1 tablet (0.5 mg total) by mouth 3 (three) times daily. 12/11/17   Barton Dubois, MD  aspirin EC 81 MG tablet Take 81 mg by mouth daily.     [provider]  glimepiride (AMARYL) 1 MG tablet Take 1 mg by mouth daily.  09/17/17   [provider]  Glycerin-Hypromellose-PEG 400 (VISINE DRY EYE OP) Apply 1 drop to eye 3 (three) times daily.    [provider]  ibuprofen (ADVIL,MOTRIN) 200 MG tablet Take 2 tablets (400 mg total) by mouth every 8 (eight) hours as needed for headache or moderate pain. 12/11/17   Barton Dubois, MD  magnesium oxide (MAG-OX) 400 MG tablet Take 800 mg by mouth daily.    [provider]  metoprolol tartrate (LOPRESSOR) 25 MG tablet Take 1 tablet by mouth 2 (two) times daily. 07/27/16   [provider]  multivitamin-lutein (OCUVITE-LUTEIN) CAPS capsule Take 1 capsule by mouth daily.    [provider]  omeprazole (PRILOSEC) 20 MG capsule Take 20 mg by mouth Daily. 08/05/12   [provider]  vitamin B-12 (CYANOCOBALAMIN) 500 MCG tablet Take 500 mcg by mouth daily.    [provider]    Physical Exam: Vitals:   10/05/19 1630 10/05/19 1700 10/05/19 1730 10/05/19 1800  BP: (!) 150/78 (!) 147/80 (!) 169/74 (!) 195/112  Pulse: 98 (!) 104 (!) 102 (!) 116  Resp: 13 17 19  (!) 27  Temp:      TempSrc:      SpO2: 100% 98% 100% 100%  Weight:      Height:        Constitutional: NAD, calm, comfortable Vitals:   10/05/19 1630 10/05/19 1700 10/05/19 1730 10/05/19 1800  BP: (!) 150/78 (!) 147/80 (!) 169/74 (!) 195/112  Pulse: 98 (!) 104 (!) 102 (!) 116  Resp: 13 17 19  (!) 27  Temp:      TempSrc:      SpO2: 100% 98% 100% 100%  Weight:      Height:       Eyes: PERRL, lids and conjunctivae normal ENMT: Mucous membranes are moist. Posterior pharynx clear of any exudate or lesions.Normal dentition.  Neck: normal,  supple, no masses, no thyromegaly Respiratory: clear to auscultation bilaterally, no wheezing, no crackles. Normal respiratory effort. No accessory muscle use.  I took patient off O2 during examination, O2 sats dropped to 85% on room air.  Patient appeared comfortable without any signs of distress. Cardiovascular: Regular rate and rhythm, 3/6 systolic murmurs, . No extremity edema. 2+ pedal pulses.  Abdomen: no tenderness, no masses palpated. No hepatosplenomegaly. Bowel sounds positive.  Musculoskeletal: no  clubbing / cyanosis. No joint deformity upper and lower extremities. Good ROM, no contractures. Normal muscle tone.  Skin: no rashes, lesions, ulcers. No induration Neurologic: CN 2-12 grossly intact. Strength 5/5 in all 4.  Psychiatric: Normal judgment and insight. Alert and oriented x 3. Normal mood.   Labs on Admission: I have personally reviewed following labs and imaging studies  CBC: Recent Labs  Lab 10/05/19 1404  WBC 12.0*  NEUTROABS 9.2*  HGB 14.9  HCT 44.5  MCV 92.1  PLT 628   Basic Metabolic Panel: Recent Labs  Lab 10/05/19 1404  NA 139  K 3.3*  CL 97*  CO2 27  GLUCOSE 169*  BUN 12  CREATININE 1.24*  CALCIUM 9.4   Liver Function Tests: Recent Labs  Lab 10/05/19 1404  AST 34  ALT 25  ALKPHOS 73  BILITOT 1.6*  PROT 7.6  ALBUMIN 4.2   Recent Labs  Lab 10/05/19 1404  LIPASE 22   Urine analysis:    Component Value Date/Time   COLORURINE YELLOW 10/05/2019 1623   APPEARANCEUR CLEAR 10/05/2019 1623   LABSPEC >1.046 (H) 10/05/2019 1623   PHURINE 9.0 (H) 10/05/2019 1623   GLUCOSEU NEGATIVE 10/05/2019 1623   HGBUR NEGATIVE 10/05/2019 1623   BILIRUBINUR NEGATIVE 10/05/2019 1623   KETONESUR 20 (A) 10/05/2019 1623   PROTEINUR 30 (A) 10/05/2019 1623   UROBILINOGEN 1.0 05/23/2015 1956   NITRITE NEGATIVE 10/05/2019 1623   LEUKOCYTESUR TRACE (A) 10/05/2019 1623    Radiological Exams on Admission: CT ABDOMEN PELVIS W CONTRAST  Result Date:  10/05/2019 CLINICAL DATA:  86 year old female with abdominal pain and fever and weakness. EXAM: CT ABDOMEN AND PELVIS WITH CONTRAST TECHNIQUE: Multidetector CT imaging of the abdomen and pelvis was performed using the standard protocol following bolus administration of intravenous contrast. CONTRAST:  18m OMNIPAQUE IOHEXOL 300 MG/ML  SOLN COMPARISON:  CT abdomen pelvis dated 03/12/2019. FINDINGS: Lower chest: Minimal right lung base linear atelectasis/scarring. The visualized lung bases are otherwise clear. There is calcification of the mitral annulus. There is a small right lung base calcified granuloma. No intra-abdominal free air or free fluid. Hepatobiliary: Minimal irregularity of the liver contour may represent early changes of cirrhosis. Clinical correlation is recommended. No intrahepatic biliary ductal dilatation. The gallbladder is distended. There is a 2.5 cm stone in the gallbladder. No pericholecystic fluid or evidence of acute cholecystitis by CT. Pancreas: Unremarkable. No pancreatic ductal dilatation or surrounding inflammatory changes. Spleen: Normal in size without focal abnormality. Adrenals/Urinary Tract: Minimal left adrenal nodularity. The right adrenal gland is unremarkable. Mild left renal parenchyma atrophy. There is a 2 mm nonobstructing left renal upper pole calculus. There is no hydronephrosis on either side. There is symmetric enhancement and excretion of contrast by both kidneys. Small left renal cysts measure up to 3 cm in the upper pole of the left kidney. Subcentimeter high attenuating lesion in the interpolar aspect of the right kidney is not characterized but appears similar to prior CT, possibly a complex or hemorrhagic cyst. The visualized ureters appear unremarkable. The urinary bladder is grossly unremarkable. Stomach/Bowel: There is severe sigmoid diverticulosis without active inflammatory changes. There is moderate stool throughout the colon. There is dilatation of long  segment small bowel loop in the abdomen measuring up to 4 cm in caliber and increased since the prior CT. No focal transition identified. There is a segment of small bowel loop in the mid to lower abdomen which demonstrates a normal caliber (coronal series 5, image 51). The small bowel loops  distal to this segment demonstrate a normal caliber. Findings may represent an ileus or a degree of partial low-grade obstruction secondary to enteritis. Clinical correlation is recommended. No high-grade obstruction. The appendix is normal. Vascular/Lymphatic: Advanced aortoiliac atherosclerotic disease. The IVC is unremarkable. No portal venous gas. There is no adenopathy. Reproductive: The uterus is suboptimally visualized. No adnexal masses Other: None Musculoskeletal: Degenerative changes of the spine and left hip. Total right hip arthroplasty. No acute osseous pathology. IMPRESSION: 1. Dilatation of a long segment of small bowel with increased in caliber since the prior radiograph. Findings may represent mild enteritis with associated ileus and less likely a partial/low grade obstruction. Clinical correlation is recommended. 2. Severe sigmoid diverticulosis.  Normal appendix. 3. Cholelithiasis. 4.  Aortic Atherosclerosis (ICD10-I70.0). Electronically Signed   By: Anner Crete M.D.   On: 10/05/2019 15:50   DG Chest Portable 1 View  Result Date: 10/05/2019 CLINICAL DATA:  Fever and weakness since last evening. EXAM: PORTABLE CHEST 1 VIEW COMPARISON:  03/12/2019 FINDINGS: The cardiac silhouette, mediastinal and hilar contours are within normal limits and stable. There are chronic emphysematous changes and mild pulmonary scarring but no infiltrates or effusions. The bony thorax is intact. IMPRESSION: Chronic emphysematous changes and pulmonary scarring but no acute pulmonary findings. Electronically Signed   By: Marijo Sanes M.D.   On: 10/05/2019 15:46    EKG: Independently reviewed. Ectopic atria rhythm. QTC 489.  No significant ST or T wave abnormality compared to prior.  Assessment/Plan Active Problems:   Dehydration  Mild AKI-creatinine 1.24, baseline 0.8-0.9 likely prerenal, secondary to vomiting, loose stools. Contrast exposure today. - 1 l bolus N/s given, Cont N/s + 20 KCL 75 cc/hr x 15 hrs - CMP a.m.  Vomiting - with diarrhea-resolved. Likely viral, CT abdomen with contrast - mild enteritis, with associated ileus. Last bowel movement yesterday. Mild leukocytosis of 12.  UA with rare bacteria trace leukocytes.  Ct chest also showing cholelitiasis, gall bladder distended, follow up ultrasound recommended if clinical concern.  - Ultrasound RUQ - Check lactic acid -Clear liquid diet, advance as tolerated -Phenergan PRN -IV fluids -Follow-up urine cultures - CMP, CBC a.m  Hypoxia- O2 sats down to 76% on room air.  Currently on 2 L O2.  Also with intermittent tachycardia, subjective fever.  Reports chronic unchanged reflux-type chest pain, denies dyspnea.  No wheezing on exam.  Portable chest x-ray chronic emphysematous changes pulmonary scarring.  - CT chest without contrast- No acute cardiopulmonary process.  - CTA not done due to AKI and already exposed to contrast today. - Hypoxia as yet unexplained, will start lovenox, pharm to dose.  - V Q scan in a.m  Hypokalemia-potassium 3.3.  Likely from GI losses. -  Replete -  Check magnesium- 1.9. -   BMP a.m.  DM2-random glucose 169. -Sliding scale insulin for now -Hold home glimepiride with poor p.o. intake  HTN- elevated. -Resume home metoprolol 25 mg twice daily - PRN labetalol 56m   DVT prophylaxis: Lovenox Code Status: Full code-confirmed with patient at bedside Family Communication: None at bedside.  Patient tells me her primary decision makers are her children. Disposition Plan: 1-2 days Consults called: None Admission status: Obs, tele     EBethena RoysMD [facility]  10/05/2019, 9:17 PM

## 2019-10-05 NOTE — ED Notes (Signed)
Pt has had no emesis since arrival

## 2019-10-05 NOTE — ED Notes (Signed)
Po provided per request

## 2019-10-05 NOTE — Progress Notes (Signed)
ANTICOAGULATION CONSULT NOTE - Initial Consult  Pharmacy Consult for lovenox Indication: PE- pending VQ scan  No Known Allergies  Patient Measurements: Height: 5' 5"  (165.1 cm) Weight: 163 lb (73.9 kg) IBW/kg (Calculated) : 57 Heparin Dosing Weight:  Vital Signs: Temp: 98.4 F (36.9 C) (01/17 1345) Temp Source: Oral (01/17 1345) BP: 108/60 (01/17 2100) Pulse Rate: 98 (01/17 2100)  Labs: Recent Labs    10/05/19 1404  HGB 14.9  HCT 44.5  PLT 307  CREATININE 1.24*    Estimated Creatinine Clearance: 27.9 mL/min (A) (by C-G formula based on SCr of 1.24 mg/dL (H)).   Medical History: Past Medical History:  Diagnosis Date  . Anxiety   . Aortic sclerosis    Long-standing heart murmur  . Chest pain 2006   +palpitations; minimal CAD at cath in 2005; normal EF; negative stress echo in 09/2008  . Cholelithiasis    on u/s 09/2011  . Degenerative joint disease    Right THA in 1995; cervical discectomy and fusion-2001  . Diabetes mellitus, type 2 (HCC)    + neuropathy  . Emphysema   . Gastroesophageal reflux disease   . Hiatal hernia   . Hyperlipidemia   . Hypertension   . IDA (iron deficiency anemia)    labs 09/2011  . Obesity   . Osteoporosis   . Sigmoid diverticulosis   . Small bowel lesion    On Given's capsule; Dr Kathleene Hazel 07/2012, no further w/u needed    Medications:  Medications Prior to Admission  Medication Sig Dispense Refill Last Dose  . acetaminophen (TYLENOL) 325 MG tablet Take 325 mg by mouth every 6 (six) hours as needed for moderate pain or headache.   unknown  . ALPRAZolam (XANAX) 0.5 MG tablet Take 1 tablet (0.5 mg total) by mouth 3 (three) times daily. 15 tablet 0 10/04/2019 at Unknown time  . Ascorbic Acid (VITAMIN C WITH ROSE HIPS) 500 MG tablet Take 500 mg by mouth daily.   10/04/2019 at Unknown time  . aspirin EC 81 MG tablet Take 81 mg by mouth daily.    10/04/2019 at Unknown time  . glimepiride (AMARYL) 1 MG tablet Take 1 mg by mouth  daily.    10/04/2019 at Unknown time  . Glycerin-Hypromellose-PEG 400 (VISINE DRY EYE OP) Apply 1 drop to eye 3 (three) times daily as needed (for dry eye relief).    unknown  . metoprolol tartrate (LOPRESSOR) 25 MG tablet Take 1 tablet by mouth 2 (two) times daily.   10/04/2019 at 2100  . multivitamin-lutein (OCUVITE-LUTEIN) CAPS capsule Take 1 capsule by mouth daily.   10/04/2019 at Unknown time  . omeprazole (PRILOSEC) 20 MG capsule Take 20 mg by mouth Daily.   10/04/2019 at Unknown time  . VITAMIN E PO Take 1 tablet by mouth daily.   10/04/2019 at Unknown time    Assessment: Pharmacy consulted to dose lovenox in patient with possible PE pending VQ scan in AM.  Goal of Therapy:  Monitor platelets by anticoagulation protocol: Yes   Plan:  Lovenox 80 mg subq every 24 hours. Monitor labs, renal function, and s/s of bleeding.  Ramond Craver 10/05/2019,9:31 PM

## 2019-10-05 NOTE — ED Provider Notes (Signed)
[facility] EMERGENCY DEPARTMENT Provider Note   CSN: [id number] Arrival date & time: 10/05/19  1330     History Chief Complaint  Patient presents with  . Emesis    Denise Parrish is a 86 y.o. female.  Patient complains of some abdominal pain with nausea and vomiting since last night  The history is provided by the patient. No language interpreter was used.  Emesis Severity:  Moderate Timing:  Constant Quality:  Bilious material Able to tolerate:  Liquids Progression:  Worsening Chronicity:  New Recent urination:  Decreased Relieved by:  Nothing Worsened by:  Nothing Ineffective treatments:  None tried Associated symptoms: abdominal pain   Associated symptoms: no cough, no diarrhea and no headaches        Past Medical History:  Diagnosis Date  . Anxiety   . Aortic sclerosis    Long-standing heart murmur  . Chest pain 2006   +palpitations; minimal CAD at cath in 2005; normal EF; negative stress echo in 09/2008  . Cholelithiasis    on u/s 09/2011  . Degenerative joint disease    Right THA in 1995; cervical discectomy and fusion-2001  . Diabetes mellitus, type 2 (HCC)    + neuropathy  . Emphysema   . Gastroesophageal reflux disease   . Hiatal hernia   . Hyperlipidemia   . Hypertension   . IDA (iron deficiency anemia)    labs 09/2011  . Obesity   . Osteoporosis   . Sigmoid diverticulosis   . Small bowel lesion    On Given's capsule; Dr Kathleene Hazel 07/2012, no further w/u needed    Patient Active Problem List   Diagnosis Date Noted  . RUQ pain   . Dehydration   . Ileus ([place]) 12/12/2017  . Enteritis   . Right upper lobe pneumonia 12/08/2017  . Sepsis ([place]) 12/08/2017  . Lactic acidosis 12/08/2017  . Anxiety 12/08/2017  . Dehydration fever 12/08/2017  . Segmental ileitis of small intestine ([place]) 06/16/2015  . Diverticulosis of colon without hemorrhage 05/25/2015  . SBO (small bowel obstruction) ([place]) 05/23/2015  . Nausea and vomiting in adult    . Nausea vomiting and diarrhea 09/03/2013  . Vomiting 09/03/2013  . Transaminitis 09/03/2013  . Left leg weakness 10/14/2012  . Difficulty in walking(719.7) 10/14/2012  . Small bowel lesion 07/09/2012  . Heme positive stool 03/14/2012  . GERD (gastroesophageal reflux disease) 03/14/2012  . Diarrhea 03/14/2012  . Right carotid bruit 11/01/2011  . Anemia, iron deficiency 10/28/2011  . Palpitations 10/16/2011  . Diabetes mellitus, type 2 ([place])   . Hypertension   . Hyperlipidemia   . Obesity   . Aortic sclerosis     Past Surgical History:  Procedure Laterality Date  . ANTERIOR CERVICAL DISCECTOMY  2001   C4-5, allograft, fixation  . BREAST LUMPECTOMY     benign  . CATARACT EXTRACTION W/ INTRAOCULAR LENS IMPLANT  2007   Left  . COLONOSCOPY  2006  . COLONOSCOPY  04/03/12   Dr. Ok Edwards diverticulosis, negative microscopic  colitis   . ESOPHAGOGASTRODUODENOSCOPY  04/03/12   Dr. Jennet Maduro hernia, chronic gastritis on bx  . GIVENS CAPSULE STUDY  05/09/2012   RMR: an unclear raised area of small bowel was noted, with features almost  characteristic of very small polyp. This was without villous  blunting or any evidence of active bleeding; yet, the area of  concern appeared to be erythematous. However, this could simply  be a light reflection on a normal variation of the small  bowel. REFERRED TO DR. GILLIAM, APPT FOR NOVEMBER 2013.   . TONSILLECTOMY    . TOTAL HIP ARTHROPLASTY  1995   Right     OB History   No obstetric history on file.     Family History  Problem Relation Age of Onset  . Diabetes Mother        + brother  . Heart failure Father   . Hypertension Father        + brother  . Cancer Brother        lung, age 30  . Uterine cancer Sister   . Colon cancer Neg Hx     Social History   Tobacco Use  . Smoking status: Former Smoker    Packs/day: 1.00    Years: 30.00    Pack years: 30.00    Types: Cigarettes    Quit date: 10/15/1970    Years since  quitting: 49.0  . Smokeless tobacco: Former Systems developer    Quit date: 09/19/1971  . Tobacco comment: Quit x 50 years  Substance Use Topics  . Alcohol use: No  . Drug use: No    Home Medications Prior to Admission medications   Medication Sig Start Date End Date Taking? Authorizing Provider  acetaminophen (TYLENOL) 325 MG tablet Take 325 mg by mouth every 6 (six) hours as needed for moderate pain or headache.    [provider]  ALPRAZolam Duanne Moron) 0.5 MG tablet Take 1 tablet (0.5 mg total) by mouth 3 (three) times daily. 12/11/17   Barton Dubois, MD  aspirin EC 81 MG tablet Take 81 mg by mouth daily.     [provider]  glimepiride (AMARYL) 1 MG tablet Take 1 mg by mouth daily.  09/17/17   [provider]  Glycerin-Hypromellose-PEG 400 (VISINE DRY EYE OP) Apply 1 drop to eye 3 (three) times daily.    [provider]  ibuprofen (ADVIL,MOTRIN) 200 MG tablet Take 2 tablets (400 mg total) by mouth every 8 (eight) hours as needed for headache or moderate pain. 12/11/17   Barton Dubois, MD  magnesium oxide (MAG-OX) 400 MG tablet Take 800 mg by mouth daily.    [provider]  metoprolol tartrate (LOPRESSOR) 25 MG tablet Take 1 tablet by mouth 2 (two) times daily. 07/27/16   [provider]  multivitamin-lutein (OCUVITE-LUTEIN) CAPS capsule Take 1 capsule by mouth daily.    [provider]  omeprazole (PRILOSEC) 20 MG capsule Take 20 mg by mouth Daily. 08/05/12   [provider]  vitamin B-12 (CYANOCOBALAMIN) 500 MCG tablet Take 500 mcg by mouth daily.    [provider]    Allergies    Patient has no known allergies.  Review of Systems   Review of Systems  Constitutional: Negative for appetite change and fatigue.  HENT: Negative for congestion, ear discharge and sinus pressure.   Eyes: Negative for discharge.  Respiratory: Negative for cough.   Cardiovascular: Negative for chest pain.  Gastrointestinal: Positive for  abdominal pain and vomiting. Negative for diarrhea.  Genitourinary: Negative for frequency and hematuria.  Musculoskeletal: Negative for back pain.  Skin: Negative for rash.  Neurological: Negative for seizures and headaches.  Psychiatric/Behavioral: Negative for hallucinations.    Physical Exam Updated Vital Signs BP 121/76   Pulse (!) 107   Temp 98.4 F (36.9 C) (Oral)   Resp 20   Ht 5' 5"  (1.651 m)   Wt 73.9 kg   SpO2 98%   BMI 27.12 kg/m  Physical Exam Vitals and nursing note reviewed.  Constitutional:      Appearance: She is well-developed.  HENT:     Head: Normocephalic.     Nose: Nose normal.  Eyes:     General: No scleral icterus.    Conjunctiva/sclera: Conjunctivae normal.  Neck:     Thyroid: No thyromegaly.  Cardiovascular:     Rate and Rhythm: Normal rate and regular rhythm.     Heart sounds: No murmur. No friction rub. No gallop.   Pulmonary:     Breath sounds: No stridor. No wheezing or rales.  Chest:     Chest wall: No tenderness.  Abdominal:     General: There is distension.     Tenderness: There is abdominal tenderness. There is no rebound.  Musculoskeletal:        General: Normal range of motion.     Cervical back: Neck supple.  Lymphadenopathy:     Cervical: No cervical adenopathy.  Skin:    Findings: No erythema or rash.  Neurological:     Mental Status: She is alert and oriented to person, place, and time.     Motor: No abnormal muscle tone.     Coordination: Coordination normal.  Psychiatric:        Behavior: Behavior normal.     ED Results / Procedures / Treatments   Labs (all labs ordered are listed, but only abnormal results are displayed) Labs Reviewed  CBC WITH DIFFERENTIAL/PLATELET - Abnormal; Notable for the following components:      Result Value   WBC 12.0 (*)    Neutro Abs 9.2 (*)    All other components within normal limits  COMPREHENSIVE METABOLIC PANEL - Abnormal; Notable for the following components:   Potassium  3.3 (*)    Chloride 97 (*)    Glucose, Bld 169 (*)    Creatinine, Ser 1.24 (*)    Total Bilirubin 1.6 (*)    GFR calc non Af Amer 37 (*)    GFR calc Af Amer 43 (*)    All other components within normal limits  RESPIRATORY PANEL BY RT PCR (FLU A&B, COVID)  LIPASE, BLOOD  URINALYSIS, ROUTINE W REFLEX MICROSCOPIC    EKG None  Radiology No results found.  Procedures Procedures (including critical care time)  Medications Ordered in ED Medications  sodium chloride 0.9 % bolus 1,000 mL (1,000 mLs Intravenous New Bag/Given 10/05/19 1406)  ondansetron (ZOFRAN) injection 4 mg (4 mg Intravenous Given 10/05/19 1407)    ED Course  I have reviewed the triage vital signs and the nursing notes.  Pertinent labs & imaging results that were available during my care of the patient were reviewed by me and considered in my medical decision making (see chart for details).    MDM Rules/Calculators/A&P                      Patient with nausea and vomiting and distended abdomen.  CT scan pending,  Dehydration and admit Final Clinical Impression(s) / ED Diagnoses Final diagnoses:  None    Rx / DC Orders ED Discharge Orders    None       Milton Ferguson, MD 10/06/19 1457

## 2019-10-05 NOTE — ED Triage Notes (Signed)
Pt c/o n/v, fever, weakness  that started during night,

## 2019-10-06 ENCOUNTER — Observation Stay (HOSPITAL_COMMUNITY): Payer: Medicare Other

## 2019-10-06 DIAGNOSIS — Z8249 Family history of ischemic heart disease and other diseases of the circulatory system: Secondary | ICD-10-CM | POA: Diagnosis not present

## 2019-10-06 DIAGNOSIS — Z96641 Presence of right artificial hip joint: Secondary | ICD-10-CM | POA: Diagnosis present

## 2019-10-06 DIAGNOSIS — Z20822 Contact with and (suspected) exposure to covid-19: Secondary | ICD-10-CM | POA: Diagnosis present

## 2019-10-06 DIAGNOSIS — K2970 Gastritis, unspecified, without bleeding: Secondary | ICD-10-CM | POA: Diagnosis present

## 2019-10-06 DIAGNOSIS — I10 Essential (primary) hypertension: Secondary | ICD-10-CM | POA: Diagnosis present

## 2019-10-06 DIAGNOSIS — K529 Noninfective gastroenteritis and colitis, unspecified: Secondary | ICD-10-CM | POA: Diagnosis present

## 2019-10-06 DIAGNOSIS — Z7982 Long term (current) use of aspirin: Secondary | ICD-10-CM | POA: Diagnosis not present

## 2019-10-06 DIAGNOSIS — E860 Dehydration: Secondary | ICD-10-CM | POA: Diagnosis present

## 2019-10-06 DIAGNOSIS — J439 Emphysema, unspecified: Secondary | ICD-10-CM | POA: Diagnosis present

## 2019-10-06 DIAGNOSIS — E119 Type 2 diabetes mellitus without complications: Secondary | ICD-10-CM | POA: Diagnosis present

## 2019-10-06 DIAGNOSIS — Z8049 Family history of malignant neoplasm of other genital organs: Secondary | ICD-10-CM | POA: Diagnosis not present

## 2019-10-06 DIAGNOSIS — M810 Age-related osteoporosis without current pathological fracture: Secondary | ICD-10-CM | POA: Diagnosis present

## 2019-10-06 DIAGNOSIS — Z79899 Other long term (current) drug therapy: Secondary | ICD-10-CM | POA: Diagnosis not present

## 2019-10-06 DIAGNOSIS — F419 Anxiety disorder, unspecified: Secondary | ICD-10-CM | POA: Diagnosis present

## 2019-10-06 DIAGNOSIS — Z833 Family history of diabetes mellitus: Secondary | ICD-10-CM | POA: Diagnosis not present

## 2019-10-06 DIAGNOSIS — K219 Gastro-esophageal reflux disease without esophagitis: Secondary | ICD-10-CM | POA: Diagnosis present

## 2019-10-06 DIAGNOSIS — R0902 Hypoxemia: Secondary | ICD-10-CM | POA: Diagnosis present

## 2019-10-06 DIAGNOSIS — N179 Acute kidney failure, unspecified: Secondary | ICD-10-CM | POA: Diagnosis present

## 2019-10-06 DIAGNOSIS — E876 Hypokalemia: Secondary | ICD-10-CM | POA: Diagnosis present

## 2019-10-06 DIAGNOSIS — K567 Ileus, unspecified: Secondary | ICD-10-CM | POA: Diagnosis present

## 2019-10-06 DIAGNOSIS — Z87891 Personal history of nicotine dependence: Secondary | ICD-10-CM | POA: Diagnosis not present

## 2019-10-06 DIAGNOSIS — Z981 Arthrodesis status: Secondary | ICD-10-CM | POA: Diagnosis not present

## 2019-10-06 DIAGNOSIS — E785 Hyperlipidemia, unspecified: Secondary | ICD-10-CM | POA: Diagnosis present

## 2019-10-06 DIAGNOSIS — Z7984 Long term (current) use of oral hypoglycemic drugs: Secondary | ICD-10-CM | POA: Diagnosis not present

## 2019-10-06 LAB — CBC
HCT: 36.7 % (ref 36.0–46.0)
Hemoglobin: 11.6 g/dL — ABNORMAL LOW (ref 12.0–15.0)
MCH: 30.6 pg (ref 26.0–34.0)
MCHC: 31.6 g/dL (ref 30.0–36.0)
MCV: 96.8 fL (ref 80.0–100.0)
Platelets: 212 10*3/uL (ref 150–400)
RBC: 3.79 MIL/uL — ABNORMAL LOW (ref 3.87–5.11)
RDW: 13 % (ref 11.5–15.5)
WBC: 9.6 10*3/uL (ref 4.0–10.5)
nRBC: 0 % (ref 0.0–0.2)

## 2019-10-06 LAB — COMPREHENSIVE METABOLIC PANEL
ALT: 18 U/L (ref 0–44)
AST: 25 U/L (ref 15–41)
Albumin: 3 g/dL — ABNORMAL LOW (ref 3.5–5.0)
Alkaline Phosphatase: 52 U/L (ref 38–126)
Anion gap: 7 (ref 5–15)
BUN: 12 mg/dL (ref 8–23)
CO2: 29 mmol/L (ref 22–32)
Calcium: 8.2 mg/dL — ABNORMAL LOW (ref 8.9–10.3)
Chloride: 106 mmol/L (ref 98–111)
Creatinine, Ser: 1.11 mg/dL — ABNORMAL HIGH (ref 0.44–1.00)
GFR calc Af Amer: 49 mL/min — ABNORMAL LOW (ref >60)
GFR calc non Af Amer: 42 mL/min — ABNORMAL LOW (ref >60)
Glucose, Bld: 116 mg/dL — ABNORMAL HIGH (ref 70–99)
Potassium: 4 mmol/L (ref 3.5–5.1)
Sodium: 142 mmol/L (ref 135–145)
Total Bilirubin: 1.4 mg/dL — ABNORMAL HIGH (ref 0.3–1.2)
Total Protein: 5.6 g/dL — ABNORMAL LOW (ref 6.5–8.1)

## 2019-10-06 LAB — TROPONIN I (HIGH SENSITIVITY)
Troponin I (High Sensitivity): 88 ng/L — ABNORMAL HIGH (ref <18)
Troponin I (High Sensitivity): 75 ng/L — ABNORMAL HIGH (ref <18)
Troponin I (High Sensitivity): 72 ng/L — ABNORMAL HIGH (ref <18)

## 2019-10-06 LAB — GLUCOSE, CAPILLARY
Glucose-Capillary: 106 mg/dL — ABNORMAL HIGH (ref 70–99)
Glucose-Capillary: 113 mg/dL — ABNORMAL HIGH (ref 70–99)
Glucose-Capillary: 114 mg/dL — ABNORMAL HIGH (ref 70–99)
Glucose-Capillary: 115 mg/dL — ABNORMAL HIGH (ref 70–99)
Glucose-Capillary: 121 mg/dL — ABNORMAL HIGH (ref 70–99)
Glucose-Capillary: 134 mg/dL — ABNORMAL HIGH (ref 70–99)

## 2019-10-06 MED ORDER — TECHNETIUM TO 99M ALBUMIN AGGREGATED
1.5000 | Freq: Once | INTRAVENOUS | Status: AC | PRN
  Administered 2019-10-06: 1.7 via INTRAVENOUS

## 2019-10-06 MED ORDER — ORAL CARE MOUTH RINSE
15.0000 mL | Freq: Two times a day (BID) | OROMUCOSAL | Status: DC
  Administered 2019-10-06 – 2019-10-07 (×3): 15 mL via OROMUCOSAL

## 2019-10-06 MED ORDER — PANTOPRAZOLE SODIUM 40 MG PO TBEC
40.0000 mg | DELAYED_RELEASE_TABLET | Freq: Two times a day (BID) | ORAL | Status: DC
  Administered 2019-10-06 – 2019-10-07 (×2): 40 mg via ORAL
  Filled 2019-10-06 (×2): qty 1

## 2019-10-06 MED ORDER — ENOXAPARIN SODIUM 40 MG/0.4ML ~~LOC~~ SOLN
40.0000 mg | SUBCUTANEOUS | Status: DC
  Administered 2019-10-06: 40 mg via SUBCUTANEOUS
  Filled 2019-10-06: qty 0.4

## 2019-10-06 MED ORDER — ALUM & MAG HYDROXIDE-SIMETH 200-200-20 MG/5ML PO SUSP
15.0000 mL | Freq: Four times a day (QID) | ORAL | Status: DC | PRN
  Administered 2019-10-06: 15 mL via ORAL
  Filled 2019-10-06: qty 30

## 2019-10-06 MED ORDER — ENOXAPARIN SODIUM 80 MG/0.8ML ~~LOC~~ SOLN: 80.0000 mg | Freq: Two times a day (BID) | SUBCUTANEOUS | Status: DC

## 2019-10-06 NOTE — Progress Notes (Signed)
PROGRESS NOTE    DILARA NAVARRETE  AJG:[id number] DOB: 20-Jan-1923 DOA: 10/05/2019 PCP: Lemmie Evens, MD    Brief Narrative:  This delightful 86 year old lady was admitted with nausea and vomiting yesterday.  She denied any chest pain but when she was heaving, she felt epigastric pain.  In the emergency room, she was noted to desaturate to 76%.  Therefore, a VQ scan was done today and this showed low probability.  Now she is feeling better with no further vomiting although certain food seems to give her epigastric discomfort.   Assessment & Plan:   Active Problems:   Diabetes mellitus, type 2 (HCC)   Hypertension   Dehydration   1.  Nausea and vomiting, possibly gastritis.  With the possibility of epigastric pain/lower chest pain, I will check troponin levels x3 just to make sure there is no evidence of acute coronary syndrome. 2.  VQ scan is negative and we will discontinue therapeutic Lovenox.  Keep her on prophylactic dose of Lovenox. 3.  If blood work for troponins is negative and blood work tomorrow morning is acceptable, she can be discharged home hopefully.   DVT prophylaxis: Lovenox. Code Status: Full code.  Disposition Plan: Home, hopefully tomorrow.   Consultants:   None.  Procedures:   VQ scan.  Antimicrobials:   None.   Subjective: No further vomiting today.    Objective: Vitals:   10/05/19 1800 10/05/19 2100 10/05/19 2149 10/06/19 0533  BP: (!) 195/112 108/60 118/68 (!) 96/53  Pulse: (!) 116 98 92 70  Resp: (!) 27 19 18 18   Temp:   98.3 F (36.8 C) 98.1 F (36.7 C)  TempSrc:   Oral Oral  SpO2: 100% 100% 99% 99%  Weight:   74.1 kg 74.6 kg  Height:   5' 5"  (1.651 m)     Intake/Output Summary (Last 24 hours) at 10/06/2019 1220 Last data filed at 10/06/2019 0100 Gross per 24 hour  Intake 192.06 ml  Output --  Net 192.06 ml   Filed Weights   10/05/19 1346 10/05/19 2149 10/06/19 0533  Weight: 73.9 kg 74.1 kg 74.6 kg     Examination:  She looks systemically well.  Although blood pressure reading is soft, she is not clinically hypotensive, she has good peripheral pulses.  Abdomen is soft and mildly uncomfortable in the epigastric area.   Data Reviewed: I have personally reviewed following labs and imaging studies  CBC: Recent Labs  Lab 10/05/19 1404 10/06/19 0649  WBC 12.0* 9.6  NEUTROABS 9.2*  --   HGB 14.9 11.6*  HCT 44.5 36.7  MCV 92.1 96.8  PLT 307 355   Basic Metabolic Panel: Recent Labs  Lab 10/05/19 1404 10/06/19 0649  NA 139 142  K 3.3* 4.0  CL 97* 106  CO2 27 29  GLUCOSE 169* 116*  BUN 12 12  CREATININE 1.24* 1.11*  CALCIUM 9.4 8.2*  MG 1.9  --    GFR: Estimated Creatinine Clearance: 31.3 mL/min (A) (by C-G formula based on SCr of 1.11 mg/dL (H)). Liver Function Tests: Recent Labs  Lab 10/05/19 1404 10/06/19 0649  AST 34 25  ALT 25 18  ALKPHOS 73 52  BILITOT 1.6* 1.4*  PROT 7.6 5.6*  ALBUMIN 4.2 3.0*   Recent Labs  Lab 10/05/19 1404  LIPASE 22   No results for input(s): AMMONIA in the last 168 hours. Coagulation Profile: No results for input(s): INR, PROTIME in the last 168 hours. Cardiac Enzymes: No results for input(s): CKTOTAL, CKMB, CKMBINDEX,  TROPONINI in the last 168 hours. BNP (last 3 results) No results for input(s): PROBNP in the last 8760 hours. HbA1C: No results for input(s): HGBA1C in the last 72 hours. CBG: Recent Labs  Lab 10/05/19 2153 10/05/19 2333 10/06/19 0404 10/06/19 0745  GLUCAP 136* 134* 113* 114*   Lipid Profile: No results for input(s): CHOL, HDL, LDLCALC, TRIG, CHOLHDL, LDLDIRECT in the last 72 hours. Thyroid Function Tests: No results for input(s): TSH, T4TOTAL, FREET4, T3FREE, THYROIDAB in the last 72 hours. Anemia Panel: No results for input(s): VITAMINB12, FOLATE, FERRITIN, TIBC, IRON, RETICCTPCT in the last 72 hours. Sepsis Labs: Recent Labs  Lab 10/05/19 2220  LATICACIDVEN 1.3    Recent Results (from the  past 240 hour(s))  Respiratory Panel by RT PCR (Flu A&B, Covid) - Nasopharyngeal Swab     Status: None   Collection Time: 10/05/19  2:02 PM   Specimen: Nasopharyngeal Swab  Result Value Ref Range Status   SARS Coronavirus 2 by RT PCR NEGATIVE NEGATIVE Final    Comment: (NOTE) SARS-CoV-2 target nucleic acids are NOT DETECTED. The SARS-CoV-2 RNA is generally detectable in upper respiratoy specimens during the acute phase of infection. The lowest concentration of SARS-CoV-2 viral copies this assay can detect is 131 copies/mL. A negative result does not preclude SARS-Cov-2 infection and should not be used as the sole basis for treatment or other patient management decisions. A negative result may occur with  improper specimen collection/handling, submission of specimen other than nasopharyngeal swab, presence of viral mutation(s) within the areas targeted by this assay, and inadequate number of viral copies (<131 copies/mL). A negative result must be combined with clinical observations, patient history, and epidemiological information. The expected result is Negative. Fact Sheet for Patients:  PinkCheek.be Fact Sheet for Healthcare Providers:  GravelBags.it This test is not yet ap proved or cleared by the [place] FDA and  has been authorized for detection and/or diagnosis of SARS-CoV-2 by FDA under an Emergency Use Authorization (EUA). This EUA will remain  in effect (meaning this test can be used) for the duration of the COVID-19 declaration under Section 564(b)(1) of the Act, 21 U.S.C. section 360bbb-3(b)(1), unless the authorization is terminated or revoked sooner.    Influenza A by PCR NEGATIVE NEGATIVE Final   Influenza B by PCR NEGATIVE NEGATIVE Final    Comment: (NOTE) The Xpert Xpress SARS-CoV-2/FLU/RSV assay is intended as an aid in  the diagnosis of influenza from Nasopharyngeal swab specimens and  should not be  used as a sole basis for treatment. Nasal washings and  aspirates are unacceptable for Xpert Xpress SARS-CoV-2/FLU/RSV  testing. Fact Sheet for Patients: PinkCheek.be Fact Sheet for Healthcare Providers: GravelBags.it This test is not yet approved or cleared by the [place] FDA and  has been authorized for detection and/or diagnosis of SARS-CoV-2 by  FDA under an Emergency Use Authorization (EUA). This EUA will remain  in effect (meaning this test can be used) for the duration of the  Covid-19 declaration under Section 564(b)(1) of the Act, 21  U.S.C. section 360bbb-3(b)(1), unless the authorization is  terminated or revoked. Performed at [facility], [address], [place],  [address]          Radiology Studies: CT CHEST WO CONTRAST  Result Date: 10/05/2019 CLINICAL DATA:  Nausea and vomiting with fever. Unexplained hypoxia. EXAM: CT CHEST WITHOUT CONTRAST TECHNIQUE: Multidetector CT imaging of the chest was performed following the standard protocol without IV contrast. COMPARISON:  None. FINDINGS: Cardiovascular: There are  atherosclerotic changes of the thoracic aorta without evidence for thoracic aortic aneurysm. The heart size is normal. Coronary artery calcifications are noted. The main pulmonary artery is not significantly dilated. Mediastinum/Nodes: --No mediastinal or hilar lymphadenopathy. --No axillary lymphadenopathy. --No supraclavicular lymphadenopathy. --Normal thyroid gland. --there is some wall thickening of the distal esophagus. There may be a small hiatal hernia. Lungs/Pleura: There are scattered areas of pleuroparenchymal scarring associated with pleural calcifications. There is scattered bilateral calcified granulomas. There is no large pleural effusion. No pneumothorax. Upper Abdomen: Cholelithiasis is noted. The gallbladder is distended. The liver appears cirrhotic. Dilated loops of small bowel are  noted in the upper abdomen. Musculoskeletal: No chest wall abnormality. No acute or significant osseous findings. IMPRESSION: 1. No acute cardiopulmonary process. 2. Scattered areas of pleuroparenchymal scarring are noted bilaterally. 3. Cholelithiasis. The gallbladder is distended. If there is clinical concern for acute cholecystitis, follow-up with ultrasound is recommended. 4. Cirrhotic appearing liver. 5. Coronary artery disease. Aortic Atherosclerosis (ICD10-I70.0). Electronically Signed   By: Constance Holster M.D.   On: 10/05/2019 20:04   NM Pulmonary Perfusion  Result Date: 10/06/2019 CLINICAL DATA:  Unexplained hypoxia, tachycardia, chest pain for a few days, history emphysema EXAM: NUCLEAR MEDICINE PERFUSION LUNG SCAN TECHNIQUE: Perfusion images were obtained in multiple projections after intravenous injection of radiopharmaceutical. Ventilation scans intentionally deferred if perfusion scan and chest x-ray adequate for interpretation during COVID 19 epidemic. RADIOPHARMACEUTICALS:  1.7 mCi Tc-59mMAA IV COMPARISON:  None Correlation: Chest radiograph and CT chest exams of 10/05/2019 FINDINGS: Minimally inhomogeneous nonsegmental irregular perfusion within the RIGHT upper lobe and at the apices likely related to COPD. No segmental or subsegmental perfusion defects identified. Ventilation exam not required. Findings represent a very low probability for pulmonary embolism. IMPRESSION: Very low probability for pulmonary embolism. Electronically Signed   By: MLavonia DanaM.D.   On: 10/06/2019 11:35   CT ABDOMEN PELVIS W CONTRAST  Result Date: 10/05/2019 CLINICAL DATA:  86year old female with abdominal pain and fever and weakness. EXAM: CT ABDOMEN AND PELVIS WITH CONTRAST TECHNIQUE: Multidetector CT imaging of the abdomen and pelvis was performed using the standard protocol following bolus administration of intravenous contrast. CONTRAST:  738mOMNIPAQUE IOHEXOL 300 MG/ML  SOLN COMPARISON:  CT abdomen  pelvis dated 03/12/2019. FINDINGS: Lower chest: Minimal right lung base linear atelectasis/scarring. The visualized lung bases are otherwise clear. There is calcification of the mitral annulus. There is a small right lung base calcified granuloma. No intra-abdominal free air or free fluid. Hepatobiliary: Minimal irregularity of the liver contour may represent early changes of cirrhosis. Clinical correlation is recommended. No intrahepatic biliary ductal dilatation. The gallbladder is distended. There is a 2.5 cm stone in the gallbladder. No pericholecystic fluid or evidence of acute cholecystitis by CT. Pancreas: Unremarkable. No pancreatic ductal dilatation or surrounding inflammatory changes. Spleen: Normal in size without focal abnormality. Adrenals/Urinary Tract: Minimal left adrenal nodularity. The right adrenal gland is unremarkable. Mild left renal parenchyma atrophy. There is a 2 mm nonobstructing left renal upper pole calculus. There is no hydronephrosis on either side. There is symmetric enhancement and excretion of contrast by both kidneys. Small left renal cysts measure up to 3 cm in the upper pole of the left kidney. Subcentimeter high attenuating lesion in the interpolar aspect of the right kidney is not characterized but appears similar to prior CT, possibly a complex or hemorrhagic cyst. The visualized ureters appear unremarkable. The urinary bladder is grossly unremarkable. Stomach/Bowel: There is severe sigmoid diverticulosis without active inflammatory changes.  There is moderate stool throughout the colon. There is dilatation of long segment small bowel loop in the abdomen measuring up to 4 cm in caliber and increased since the prior CT. No focal transition identified. There is a segment of small bowel loop in the mid to lower abdomen which demonstrates a normal caliber (coronal series 5, image 51). The small bowel loops distal to this segment demonstrate a normal caliber. Findings may represent  an ileus or a degree of partial low-grade obstruction secondary to enteritis. Clinical correlation is recommended. No high-grade obstruction. The appendix is normal. Vascular/Lymphatic: Advanced aortoiliac atherosclerotic disease. The IVC is unremarkable. No portal venous gas. There is no adenopathy. Reproductive: The uterus is suboptimally visualized. No adnexal masses Other: None Musculoskeletal: Degenerative changes of the spine and left hip. Total right hip arthroplasty. No acute osseous pathology. IMPRESSION: 1. Dilatation of a long segment of small bowel with increased in caliber since the prior radiograph. Findings may represent mild enteritis with associated ileus and less likely a partial/low grade obstruction. Clinical correlation is recommended. 2. Severe sigmoid diverticulosis.  Normal appendix. 3. Cholelithiasis. 4.  Aortic Atherosclerosis (ICD10-I70.0). Electronically Signed   By: Anner Crete M.D.   On: 10/05/2019 15:50   DG Chest Portable 1 View  Result Date: 10/05/2019 CLINICAL DATA:  Fever and weakness since last evening. EXAM: PORTABLE CHEST 1 VIEW COMPARISON:  03/12/2019 FINDINGS: The cardiac silhouette, mediastinal and hilar contours are within normal limits and stable. There are chronic emphysematous changes and mild pulmonary scarring but no infiltrates or effusions. The bony thorax is intact. IMPRESSION: Chronic emphysematous changes and pulmonary scarring but no acute pulmonary findings. Electronically Signed   By: Marijo Sanes M.D.   On: 10/05/2019 15:46   US Abdomen Limited RUQ  Result Date: 10/06/2019 CLINICAL DATA:  Nausea and vomiting EXAM: ULTRASOUND ABDOMEN LIMITED RIGHT UPPER QUADRANT COMPARISON:  10/05/2019 FINDINGS: Gallbladder: Gallbladder is well distended with a large gallstone within. This is similar to that seen on prior CT examination. Common bile duct: Diameter: 7.5 mm. Given the patient's age this is likely within normal limits. No biliary ductal dilatation was  seen on the prior CT examination. Liver: No focal lesion identified. Within normal limits in parenchymal echogenicity. Portal vein is patent on color Doppler imaging with normal direction of blood flow towards the liver. Other: None. IMPRESSION: Cholelithiasis without complicating factors. Electronically Signed   By: Inez Catalina M.D.   On: 10/06/2019 10:55        Scheduled Meds: . ALPRAZolam  0.5 mg Oral TID  . aspirin EC  81 mg Oral Daily  . enoxaparin (LOVENOX) injection  40 mg Subcutaneous Q24H  . insulin aspart  0-9 Units Subcutaneous Q4H  . mouth rinse  15 mL Mouth Rinse BID  . metoprolol tartrate  25 mg Oral BID  . pantoprazole  40 mg Oral Daily   Continuous Infusions: . 0.9 % NaCl with KCl 20 mEq / L 75 mL/hr at 10/05/19 2226     LOS: 0 days       Arretta Toenjes Luther Parody, MD [facility]   If 7PM-7AM, please contact night-coverage [email or web address]  10/06/2019, 12:20 PM

## 2019-10-06 NOTE — Progress Notes (Signed)
Patient has low bp. Notified mid-level.  Received order to hold patient night time dose of metoprolol. Will continue to monitor patient.

## 2019-10-06 NOTE — Progress Notes (Signed)
Pt requested denture cup to place hearing aids in. Pts hearing placed in denture cup and placed on bedside table

## 2019-10-06 NOTE — Progress Notes (Signed)
Pt has been resting comfortably since arriving onto floor. She had sips of ginger ale with her night meds; no nausea or vomiting. Will continue to assess.

## 2019-10-07 LAB — COMPREHENSIVE METABOLIC PANEL
ALT: 21 U/L (ref 0–44)
AST: 27 U/L (ref 15–41)
Albumin: 2.7 g/dL — ABNORMAL LOW (ref 3.5–5.0)
Alkaline Phosphatase: 50 U/L (ref 38–126)
Anion gap: 7 (ref 5–15)
BUN: 8 mg/dL (ref 8–23)
CO2: 27 mmol/L (ref 22–32)
Calcium: 8.1 mg/dL — ABNORMAL LOW (ref 8.9–10.3)
Chloride: 104 mmol/L (ref 98–111)
Creatinine, Ser: 0.93 mg/dL (ref 0.44–1.00)
GFR calc Af Amer: >60 mL/min (ref >60)
GFR calc non Af Amer: 53 mL/min — ABNORMAL LOW (ref >60)
Glucose, Bld: 102 mg/dL — ABNORMAL HIGH (ref 70–99)
Potassium: 3.8 mmol/L (ref 3.5–5.1)
Sodium: 138 mmol/L (ref 135–145)
Total Bilirubin: 1.2 mg/dL (ref 0.3–1.2)
Total Protein: 5.3 g/dL — ABNORMAL LOW (ref 6.5–8.1)

## 2019-10-07 LAB — URINE CULTURE

## 2019-10-07 LAB — CBC
HCT: 35.1 % — ABNORMAL LOW (ref 36.0–46.0)
Hemoglobin: 11.1 g/dL — ABNORMAL LOW (ref 12.0–15.0)
MCH: 30.7 pg (ref 26.0–34.0)
MCHC: 31.6 g/dL (ref 30.0–36.0)
MCV: 97.2 fL (ref 80.0–100.0)
Platelets: 201 10*3/uL (ref 150–400)
RBC: 3.61 MIL/uL — ABNORMAL LOW (ref 3.87–5.11)
RDW: 12.7 % (ref 11.5–15.5)
WBC: 6.1 10*3/uL (ref 4.0–10.5)
nRBC: 0 % (ref 0.0–0.2)

## 2019-10-07 LAB — GLUCOSE, CAPILLARY
Glucose-Capillary: 104 mg/dL — ABNORMAL HIGH (ref 70–99)
Glucose-Capillary: 105 mg/dL — ABNORMAL HIGH (ref 70–99)

## 2019-10-07 NOTE — Progress Notes (Signed)
Nsg Discharge Note  Admit Date:  10/05/2019 Discharge date: 10/07/2019   [name] to be D/C'd Home per MD order.  AVS completed.  Copy for chart, and copy for patient signed, and dated. Patient/caregiver able to verbalize understanding.  Discharge Medication: Allergies as of 10/07/2019   No Known Allergies     Medication List    TAKE these medications   acetaminophen 325 MG tablet Commonly known as: TYLENOL Take 325 mg by mouth every 6 (six) hours as needed for moderate pain or headache.   ALPRAZolam 0.5 MG tablet Commonly known as: XANAX Take 1 tablet (0.5 mg total) by mouth 3 (three) times daily.   aspirin EC 81 MG tablet Take 81 mg by mouth daily.   glimepiride 1 MG tablet Commonly known as: AMARYL Take 1 mg by mouth daily.   metoprolol tartrate 25 MG tablet Commonly known as: LOPRESSOR Take 1 tablet by mouth 2 (two) times daily.   multivitamin-lutein Caps capsule Take 1 capsule by mouth daily.   omeprazole 20 MG capsule Commonly known as: PRILOSEC Take 20 mg by mouth Daily.   VISINE DRY EYE OP Apply 1 drop to eye 3 (three) times daily as needed (for dry eye relief).   vitamin C with rose hips 500 MG tablet Take 500 mg by mouth daily.   VITAMIN E PO Take 1 tablet by mouth daily.       Discharge Assessment: Vitals:   10/07/19 0600 10/07/19 0800  BP: (!) 100/46   Pulse: 82   Resp: 20   Temp: 98 F (36.7 C)   SpO2: 94% 96%   Skin clean, dry and intact without evidence of skin break down, no evidence of skin tears noted. IV catheter discontinued intact. Site without signs and symptoms of complications - no redness or edema noted at insertion site, patient denies c/o pain - only slight tenderness at site.  Dressing with slight pressure applied.  D/c Instructions-Education: Discharge instructions given to patient/family with verbalized understanding. D/c education completed with patient/family including follow up instructions, medication list,  d/c activities limitations if indicated, with other d/c instructions as indicated by MD - patient able to verbalize understanding, all questions fully answered. Patient instructed to return to ED, call 911, or call MD for any changes in condition.  Patient escorted via [place], and D/C home via private auto.  Loa Socks, RN 10/07/2019 11:07 AM

## 2019-10-07 NOTE — Discharge Summary (Signed)
Physician Discharge Summary  VIRDA BETTERS PJA:[id number] DOB: 01-Jun-1923 DOA: 10/05/2019  PCP: Lemmie Evens, MD  Admit date: 10/05/2019 Discharge date: 10/07/2019  Admitted From: Home Disposition: Home  Recommendations for Outpatient Follow-up:  1. Follow up with PCP in 1-2 weeks 2. Please follow up on the following pending results:  Home Health: None Equipment/Devices: None  Discharge Condition: Stable CODE STATUS: Full code Diet recommendation: Regular  Brief/Interim Summary: This delightful 86 year old lady was admitted with nausea and vomiting.  Please see initial history and physical examination for initial evaluation.  She was noted to have desaturation to 76% which appeared to be sudden.  She underwent a VQ scan which was showing low probability for pulmonary embolism.  Anticoagulation with Lovenox was at that point discontinued and she had prophylactic Lovenox.  The working diagnosis of her symptoms was likely gastritis.  She underwent ultrasound of the biliary system which did show cholelithiasis but no dilatation of the biliary system and no inflammation of the gallbladder and it was felt that this finding was incidental rather than causative.  She certainly feels much improved this morning and her symptoms of completely resolved.  Serial troponin levels were unremarkable also. She is in a stable condition to go home and she will follow-up with her primary care physician in due course.  Discharge Diagnoses:  Active Problems:   Diabetes mellitus, type 2 ([place])   Hypertension   Dehydration    Discharge Instructions  Discharge Instructions    Diet - low sodium heart healthy   Complete by: As directed    Increase activity slowly   Complete by: As directed      Allergies as of 10/07/2019   No Known Allergies     Medication List    TAKE these medications   acetaminophen 325 MG tablet Commonly known as: TYLENOL Take 325 mg by mouth every 6 (six) hours as needed  for moderate pain or headache.   ALPRAZolam 0.5 MG tablet Commonly known as: XANAX Take 1 tablet (0.5 mg total) by mouth 3 (three) times daily.   aspirin EC 81 MG tablet Take 81 mg by mouth daily.   glimepiride 1 MG tablet Commonly known as: AMARYL Take 1 mg by mouth daily.   metoprolol tartrate 25 MG tablet Commonly known as: LOPRESSOR Take 1 tablet by mouth 2 (two) times daily.   multivitamin-lutein Caps capsule Take 1 capsule by mouth daily.   omeprazole 20 MG capsule Commonly known as: PRILOSEC Take 20 mg by mouth Daily.   VISINE DRY EYE OP Apply 1 drop to eye 3 (three) times daily as needed (for dry eye relief).   vitamin C with rose hips 500 MG tablet Take 500 mg by mouth daily.   VITAMIN E PO Take 1 tablet by mouth daily.         Consultations:  None   Procedures/Studies: CT CHEST WO CONTRAST  Result Date: 10/05/2019 CLINICAL DATA:  Nausea and vomiting with fever. Unexplained hypoxia. EXAM: CT CHEST WITHOUT CONTRAST TECHNIQUE: Multidetector CT imaging of the chest was performed following the standard protocol without IV contrast. COMPARISON:  None. FINDINGS: Cardiovascular: There are atherosclerotic changes of the thoracic aorta without evidence for thoracic aortic aneurysm. The heart size is normal. Coronary artery calcifications are noted. The main pulmonary artery is not significantly dilated. Mediastinum/Nodes: --No mediastinal or hilar lymphadenopathy. --No axillary lymphadenopathy. --No supraclavicular lymphadenopathy. --Normal thyroid gland. --there is some wall thickening of the distal esophagus. There may be a small hiatal hernia. Lungs/Pleura:  There are scattered areas of pleuroparenchymal scarring associated with pleural calcifications. There is scattered bilateral calcified granulomas. There is no large pleural effusion. No pneumothorax. Upper Abdomen: Cholelithiasis is noted. The gallbladder is distended. The liver appears cirrhotic. Dilated loops of  small bowel are noted in the upper abdomen. Musculoskeletal: No chest wall abnormality. No acute or significant osseous findings. IMPRESSION: 1. No acute cardiopulmonary process. 2. Scattered areas of pleuroparenchymal scarring are noted bilaterally. 3. Cholelithiasis. The gallbladder is distended. If there is clinical concern for acute cholecystitis, follow-up with ultrasound is recommended. 4. Cirrhotic appearing liver. 5. Coronary artery disease. Aortic Atherosclerosis (ICD10-I70.0). Electronically Signed   By: Constance Holster M.D.   On: 10/05/2019 20:04   NM Pulmonary Perfusion  Result Date: 10/06/2019 CLINICAL DATA:  Unexplained hypoxia, tachycardia, chest pain for a few days, history emphysema EXAM: NUCLEAR MEDICINE PERFUSION LUNG SCAN TECHNIQUE: Perfusion images were obtained in multiple projections after intravenous injection of radiopharmaceutical. Ventilation scans intentionally deferred if perfusion scan and chest x-ray adequate for interpretation during COVID 19 epidemic. RADIOPHARMACEUTICALS:  1.7 mCi Tc-73mMAA IV COMPARISON:  None Correlation: Chest radiograph and CT chest exams of 10/05/2019 FINDINGS: Minimally inhomogeneous nonsegmental irregular perfusion within the RIGHT upper lobe and at the apices likely related to COPD. No segmental or subsegmental perfusion defects identified. Ventilation exam not required. Findings represent a very low probability for pulmonary embolism. IMPRESSION: Very low probability for pulmonary embolism. Electronically Signed   By: MLavonia DanaM.D.   On: 10/06/2019 11:35   CT ABDOMEN PELVIS W CONTRAST  Result Date: 10/05/2019 CLINICAL DATA:  86year old female with abdominal pain and fever and weakness. EXAM: CT ABDOMEN AND PELVIS WITH CONTRAST TECHNIQUE: Multidetector CT imaging of the abdomen and pelvis was performed using the standard protocol following bolus administration of intravenous contrast. CONTRAST:  791mOMNIPAQUE IOHEXOL 300 MG/ML  SOLN  COMPARISON:  CT abdomen pelvis dated 03/12/2019. FINDINGS: Lower chest: Minimal right lung base linear atelectasis/scarring. The visualized lung bases are otherwise clear. There is calcification of the mitral annulus. There is a small right lung base calcified granuloma. No intra-abdominal free air or free fluid. Hepatobiliary: Minimal irregularity of the liver contour may represent early changes of cirrhosis. Clinical correlation is recommended. No intrahepatic biliary ductal dilatation. The gallbladder is distended. There is a 2.5 cm stone in the gallbladder. No pericholecystic fluid or evidence of acute cholecystitis by CT. Pancreas: Unremarkable. No pancreatic ductal dilatation or surrounding inflammatory changes. Spleen: Normal in size without focal abnormality. Adrenals/Urinary Tract: Minimal left adrenal nodularity. The right adrenal gland is unremarkable. Mild left renal parenchyma atrophy. There is a 2 mm nonobstructing left renal upper pole calculus. There is no hydronephrosis on either side. There is symmetric enhancement and excretion of contrast by both kidneys. Small left renal cysts measure up to 3 cm in the upper pole of the left kidney. Subcentimeter high attenuating lesion in the interpolar aspect of the right kidney is not characterized but appears similar to prior CT, possibly a complex or hemorrhagic cyst. The visualized ureters appear unremarkable. The urinary bladder is grossly unremarkable. Stomach/Bowel: There is severe sigmoid diverticulosis without active inflammatory changes. There is moderate stool throughout the colon. There is dilatation of long segment small bowel loop in the abdomen measuring up to 4 cm in caliber and increased since the prior CT. No focal transition identified. There is a segment of small bowel loop in the mid to lower abdomen which demonstrates a normal caliber (coronal series 5, image 51). The small  bowel loops distal to this segment demonstrate a normal caliber.  Findings may represent an ileus or a degree of partial low-grade obstruction secondary to enteritis. Clinical correlation is recommended. No high-grade obstruction. The appendix is normal. Vascular/Lymphatic: Advanced aortoiliac atherosclerotic disease. The IVC is unremarkable. No portal venous gas. There is no adenopathy. Reproductive: The uterus is suboptimally visualized. No adnexal masses Other: None Musculoskeletal: Degenerative changes of the spine and left hip. Total right hip arthroplasty. No acute osseous pathology. IMPRESSION: 1. Dilatation of a long segment of small bowel with increased in caliber since the prior radiograph. Findings may represent mild enteritis with associated ileus and less likely a partial/low grade obstruction. Clinical correlation is recommended. 2. Severe sigmoid diverticulosis.  Normal appendix. 3. Cholelithiasis. 4.  Aortic Atherosclerosis (ICD10-I70.0). Electronically Signed   By: Anner Crete M.D.   On: 10/05/2019 15:50   DG Chest Portable 1 View  Result Date: 10/05/2019 CLINICAL DATA:  Fever and weakness since last evening. EXAM: PORTABLE CHEST 1 VIEW COMPARISON:  03/12/2019 FINDINGS: The cardiac silhouette, mediastinal and hilar contours are within normal limits and stable. There are chronic emphysematous changes and mild pulmonary scarring but no infiltrates or effusions. The bony thorax is intact. IMPRESSION: Chronic emphysematous changes and pulmonary scarring but no acute pulmonary findings. Electronically Signed   By: Marijo Sanes M.D.   On: 10/05/2019 15:46   US Abdomen Limited RUQ  Result Date: 10/06/2019 CLINICAL DATA:  Nausea and vomiting EXAM: ULTRASOUND ABDOMEN LIMITED RIGHT UPPER QUADRANT COMPARISON:  10/05/2019 FINDINGS: Gallbladder: Gallbladder is well distended with a large gallstone within. This is similar to that seen on prior CT examination. Common bile duct: Diameter: 7.5 mm. Given the patient's age this is likely within normal limits. No  biliary ductal dilatation was seen on the prior CT examination. Liver: No focal lesion identified. Within normal limits in parenchymal echogenicity. Portal vein is patent on color Doppler imaging with normal direction of blood flow towards the liver. Other: None. IMPRESSION: Cholelithiasis without complicating factors. Electronically Signed   By: Inez Catalina M.D.   On: 10/06/2019 10:55       Subjective:   Discharge Exam: Vitals:   10/06/19 2048 10/06/19 2127 10/07/19 0600 10/07/19 0800  BP:  (!) 99/47 (!) 100/46   Pulse:  79 82   Resp:  20 20   Temp:  98.4 F (36.9 C) 98 F (36.7 C)   TempSrc:  Oral    SpO2: 95% 96% 94% 96%  Weight:      Height:            The results of significant diagnostics from this hospitalization (including imaging, microbiology, ancillary and laboratory) are listed below for reference.     Microbiology: Recent Results (from the past 240 hour(s))  Respiratory Panel by RT PCR (Flu A&B, Covid) - Nasopharyngeal Swab     Status: None   Collection Time: 10/05/19  2:02 PM   Specimen: Nasopharyngeal Swab  Result Value Ref Range Status   SARS Coronavirus 2 by RT PCR NEGATIVE NEGATIVE Final    Comment: (NOTE) SARS-CoV-2 target nucleic acids are NOT DETECTED. The SARS-CoV-2 RNA is generally detectable in upper respiratoy specimens during the acute phase of infection. The lowest concentration of SARS-CoV-2 viral copies this assay can detect is 131 copies/mL. A negative result does not preclude SARS-Cov-2 infection and should not be used as the sole basis for treatment or other patient management decisions. A negative result may occur with  improper specimen collection/handling, submission of  specimen other than nasopharyngeal swab, presence of viral mutation(s) within the areas targeted by this assay, and inadequate number of viral copies (<131 copies/mL). A negative result must be combined with clinical observations, patient history, and  epidemiological information. The expected result is Negative. Fact Sheet for Patients:  PinkCheek.be Fact Sheet for Healthcare Providers:  GravelBags.it This test is not yet ap proved or cleared by the [place] FDA and  has been authorized for detection and/or diagnosis of SARS-CoV-2 by FDA under an Emergency Use Authorization (EUA). This EUA will remain  in effect (meaning this test can be used) for the duration of the COVID-19 declaration under Section 564(b)(1) of the Act, 21 U.S.C. section 360bbb-3(b)(1), unless the authorization is terminated or revoked sooner.    Influenza A by PCR NEGATIVE NEGATIVE Final   Influenza B by PCR NEGATIVE NEGATIVE Final    Comment: (NOTE) The Xpert Xpress SARS-CoV-2/FLU/RSV assay is intended as an aid in  the diagnosis of influenza from Nasopharyngeal swab specimens and  should not be used as a sole basis for treatment. Nasal washings and  aspirates are unacceptable for Xpert Xpress SARS-CoV-2/FLU/RSV  testing. Fact Sheet for Patients: PinkCheek.be Fact Sheet for Healthcare Providers: GravelBags.it This test is not yet approved or cleared by the [place] FDA and  has been authorized for detection and/or diagnosis of SARS-CoV-2 by  FDA under an Emergency Use Authorization (EUA). This EUA will remain  in effect (meaning this test can be used) for the duration of the  Covid-19 declaration under Section 564(b)(1) of the Act, 21  U.S.C. section 360bbb-3(b)(1), unless the authorization is  terminated or revoked. Performed at [facility], [address] [place], [place] [address]   Culture, Urine     Status: Abnormal   Collection Time: 10/05/19  4:23 PM   Specimen: Urine, Catheterized  Result Value Ref Range Status   Specimen Description   Final    URINE, CATHETERIZED Performed at [facility], [address]  [place], [place] [address]    Special Requests   Final    NONE Performed at [facility], [address] [place], [place] [address]    Culture MULTIPLE SPECIES PRESENT, SUGGEST RECOLLECTION (A)  Final   Report Status 10/07/2019 FINAL  Final     Labs: BNP (last 3 results) No results for input(s): BNP in the last 8760 hours. Basic Metabolic Panel: Recent Labs  Lab 10/05/19 1404 10/06/19 0649 10/07/19 0436  NA 139 142 138  K 3.3* 4.0 3.8  CL 97* 106 104  CO2 27 29 27   GLUCOSE 169* 116* 102*  BUN 12 12 8   CREATININE 1.24* 1.11* 0.93  CALCIUM 9.4 8.2* 8.1*  MG 1.9  --   --    Liver Function Tests: Recent Labs  Lab 10/05/19 1404 10/06/19 0649 10/07/19 0436  AST 34 25 27  ALT 25 18 21   ALKPHOS 73 52 50  BILITOT 1.6* 1.4* 1.2  PROT 7.6 5.6* 5.3*  ALBUMIN 4.2 3.0* 2.7*   Recent Labs  Lab 10/05/19 1404  LIPASE 22   No results for input(s): AMMONIA in the last 168 hours. CBC: Recent Labs  Lab 10/05/19 1404 10/06/19 0649 10/07/19 0436  WBC 12.0* 9.6 6.1  NEUTROABS 9.2*  --   --   HGB 14.9 11.6* 11.1*  HCT 44.5 36.7 35.1*  MCV 92.1 96.8 97.2  PLT 307 212 201   Cardiac Enzymes: No results for input(s): CKTOTAL, CKMB, CKMBINDEX, TROPONINI in the last 168 hours. BNP:  Invalid input(s): POCBNP CBG: Recent Labs  Lab 10/06/19 1635 10/06/19 2013 10/06/19 2343 10/07/19 0417 10/07/19 0816  GLUCAP 115* 134* 106* 104* 105*   D-Dimer No results for input(s): DDIMER in the last 72 hours. Hgb A1c No results for input(s): HGBA1C in the last 72 hours. Lipid Profile No results for input(s): CHOL, HDL, LDLCALC, TRIG, CHOLHDL, LDLDIRECT in the last 72 hours. Thyroid function studies No results for input(s): TSH, T4TOTAL, T3FREE, THYROIDAB in the last 72 hours.  Invalid input(s): FREET3 Anemia work up No results for input(s): VITAMINB12, FOLATE, FERRITIN, TIBC, IRON, RETICCTPCT in the last 72 hours. Urinalysis    Component Value Date/Time   COLORURINE YELLOW  10/05/2019 1623   APPEARANCEUR CLEAR 10/05/2019 1623   LABSPEC >1.046 (H) 10/05/2019 1623   PHURINE 9.0 (H) 10/05/2019 1623   GLUCOSEU NEGATIVE 10/05/2019 1623   HGBUR NEGATIVE 10/05/2019 1623   BILIRUBINUR NEGATIVE 10/05/2019 1623   KETONESUR 20 (A) 10/05/2019 1623   PROTEINUR 30 (A) 10/05/2019 1623   UROBILINOGEN 1.0 05/23/2015 1956   NITRITE NEGATIVE 10/05/2019 1623   LEUKOCYTESUR TRACE (A) 10/05/2019 1623   Sepsis Labs Invalid input(s): PROCALCITONIN,  WBC,  LACTICIDVEN Microbiology Recent Results (from the past 240 hour(s))  Respiratory Panel by RT PCR (Flu A&B, Covid) - Nasopharyngeal Swab     Status: None   Collection Time: 10/05/19  2:02 PM   Specimen: Nasopharyngeal Swab  Result Value Ref Range Status   SARS Coronavirus 2 by RT PCR NEGATIVE NEGATIVE Final    Comment: (NOTE) SARS-CoV-2 target nucleic acids are NOT DETECTED. The SARS-CoV-2 RNA is generally detectable in upper respiratoy specimens during the acute phase of infection. The lowest concentration of SARS-CoV-2 viral copies this assay can detect is 131 copies/mL. A negative result does not preclude SARS-Cov-2 infection and should not be used as the sole basis for treatment or other patient management decisions. A negative result may occur with  improper specimen collection/handling, submission of specimen other than nasopharyngeal swab, presence of viral mutation(s) within the areas targeted by this assay, and inadequate number of viral copies (<131 copies/mL). A negative result must be combined with clinical observations, patient history, and epidemiological information. The expected result is Negative. Fact Sheet for Patients:  PinkCheek.be Fact Sheet for Healthcare Providers:  GravelBags.it This test is not yet ap proved or cleared by the [place] FDA and  has been authorized for detection and/or diagnosis of SARS-CoV-2 by FDA under an  Emergency Use Authorization (EUA). This EUA will remain  in effect (meaning this test can be used) for the duration of the COVID-19 declaration under Section 564(b)(1) of the Act, 21 U.S.C. section 360bbb-3(b)(1), unless the authorization is terminated or revoked sooner.    Influenza A by PCR NEGATIVE NEGATIVE Final   Influenza B by PCR NEGATIVE NEGATIVE Final    Comment: (NOTE) The Xpert Xpress SARS-CoV-2/FLU/RSV assay is intended as an aid in  the diagnosis of influenza from Nasopharyngeal swab specimens and  should not be used as a sole basis for treatment. Nasal washings and  aspirates are unacceptable for Xpert Xpress SARS-CoV-2/FLU/RSV  testing. Fact Sheet for Patients: PinkCheek.be Fact Sheet for Healthcare Providers: GravelBags.it This test is not yet approved or cleared by the [place] FDA and  has been authorized for detection and/or diagnosis of SARS-CoV-2 by  FDA under an Emergency Use Authorization (EUA). This EUA will remain  in effect (meaning this test can be used) for the duration of the  Covid-19 declaration under Section 564(b)(1)  of the Act, 21  U.S.C. section 360bbb-3(b)(1), unless the authorization is  terminated or revoked. Performed at [facility], [address], [place], [place] [address]   Culture, Urine     Status: Abnormal   Collection Time: 10/05/19  4:23 PM   Specimen: Urine, Catheterized  Result Value Ref Range Status   Specimen Description   Final    URINE, CATHETERIZED Performed at [facility], [address] [place], [place] [address]    Special Requests   Final    NONE Performed at [facility], [address], [place], [place] [address]    Culture MULTIPLE SPECIES PRESENT, SUGGEST RECOLLECTION (A)  Final   Report Status 10/07/2019 FINAL  Final     Time coordinating discharge: 25 minutes  SIGNED:   Doree Albee, MD  [facility] 10/07/2019, 10:02 AM   If  7PM-7AM, please contact night-coverage [email or web address]

## 2019-10-16 ENCOUNTER — Emergency Department (HOSPITAL_COMMUNITY): Payer: Medicare Other

## 2019-10-16 ENCOUNTER — Observation Stay (HOSPITAL_COMMUNITY): Payer: Medicare Other

## 2019-10-16 ENCOUNTER — Other Ambulatory Visit: Payer: Self-pay

## 2019-10-16 ENCOUNTER — Encounter (HOSPITAL_COMMUNITY): Payer: Self-pay

## 2019-10-16 ENCOUNTER — Inpatient Hospital Stay (HOSPITAL_COMMUNITY)
Admission: EM | Admit: 2019-10-16 | Discharge: 2019-10-22 | DRG: 039 | Disposition: A | Discharge status: Home / Self Care | Payer: Medicare Other | Attending: Internal Medicine | Admitting: Internal Medicine

## 2019-10-16 DIAGNOSIS — I6521 Occlusion and stenosis of right carotid artery: Secondary | ICD-10-CM | POA: Diagnosis present

## 2019-10-16 DIAGNOSIS — R29810 Facial weakness: Secondary | ICD-10-CM | POA: Diagnosis present

## 2019-10-16 DIAGNOSIS — R4781 Slurred speech: Secondary | ICD-10-CM | POA: Diagnosis present

## 2019-10-16 DIAGNOSIS — G459 Transient cerebral ischemic attack, unspecified: Secondary | ICD-10-CM | POA: Diagnosis present

## 2019-10-16 DIAGNOSIS — Z20822 Contact with and (suspected) exposure to covid-19: Secondary | ICD-10-CM | POA: Diagnosis present

## 2019-10-16 DIAGNOSIS — R479 Unspecified speech disturbances: Secondary | ICD-10-CM

## 2019-10-16 DIAGNOSIS — K219 Gastro-esophageal reflux disease without esophagitis: Secondary | ICD-10-CM | POA: Diagnosis present

## 2019-10-16 DIAGNOSIS — I63511 Cerebral infarction due to unspecified occlusion or stenosis of right middle cerebral artery: Principal | ICD-10-CM | POA: Diagnosis present

## 2019-10-16 DIAGNOSIS — Z79899 Other long term (current) drug therapy: Secondary | ICD-10-CM

## 2019-10-16 DIAGNOSIS — I10 Essential (primary) hypertension: Secondary | ICD-10-CM | POA: Diagnosis present

## 2019-10-16 DIAGNOSIS — E1169 Type 2 diabetes mellitus with other specified complication: Secondary | ICD-10-CM

## 2019-10-16 DIAGNOSIS — H9190 Unspecified hearing loss, unspecified ear: Secondary | ICD-10-CM | POA: Diagnosis present

## 2019-10-16 DIAGNOSIS — R29701 NIHSS score 1: Secondary | ICD-10-CM | POA: Diagnosis present

## 2019-10-16 DIAGNOSIS — I639 Cerebral infarction, unspecified: Secondary | ICD-10-CM | POA: Diagnosis not present

## 2019-10-16 DIAGNOSIS — Z8249 Family history of ischemic heart disease and other diseases of the circulatory system: Secondary | ICD-10-CM

## 2019-10-16 DIAGNOSIS — Z7982 Long term (current) use of aspirin: Secondary | ICD-10-CM

## 2019-10-16 DIAGNOSIS — Z96641 Presence of right artificial hip joint: Secondary | ICD-10-CM | POA: Diagnosis present

## 2019-10-16 DIAGNOSIS — E119 Type 2 diabetes mellitus without complications: Secondary | ICD-10-CM

## 2019-10-16 DIAGNOSIS — R4182 Altered mental status, unspecified: Secondary | ICD-10-CM

## 2019-10-16 DIAGNOSIS — R471 Dysarthria and anarthria: Secondary | ICD-10-CM | POA: Diagnosis present

## 2019-10-16 DIAGNOSIS — G3184 Mild cognitive impairment, so stated: Secondary | ICD-10-CM | POA: Diagnosis present

## 2019-10-16 DIAGNOSIS — F419 Anxiety disorder, unspecified: Secondary | ICD-10-CM | POA: Diagnosis present

## 2019-10-16 HISTORY — DX: Pneumonia, unspecified organism: J18.9

## 2019-10-16 HISTORY — DX: Occlusion and stenosis of right carotid artery: I65.21

## 2019-10-16 HISTORY — DX: Mild cognitive impairment of uncertain or unknown etiology: G31.84

## 2019-10-16 LAB — COMPREHENSIVE METABOLIC PANEL
ALT: 20 U/L (ref 0–44)
AST: 24 U/L (ref 15–41)
Albumin: 4.1 g/dL (ref 3.5–5.0)
Alkaline Phosphatase: 66 U/L (ref 38–126)
Anion gap: 8 (ref 5–15)
BUN: 17 mg/dL (ref 8–23)
CO2: 28 mmol/L (ref 22–32)
Calcium: 9.4 mg/dL (ref 8.9–10.3)
Chloride: 103 mmol/L (ref 98–111)
Creatinine, Ser: 1.12 mg/dL — ABNORMAL HIGH (ref 0.44–1.00)
GFR calc Af Amer: 49 mL/min — ABNORMAL LOW (ref >60)
GFR calc non Af Amer: 42 mL/min — ABNORMAL LOW (ref >60)
Glucose, Bld: 122 mg/dL — ABNORMAL HIGH (ref 70–99)
Potassium: 4.2 mmol/L (ref 3.5–5.1)
Sodium: 139 mmol/L (ref 135–145)
Total Bilirubin: 0.8 mg/dL (ref 0.3–1.2)
Total Protein: 7.4 g/dL (ref 6.5–8.1)

## 2019-10-16 LAB — URINALYSIS, ROUTINE W REFLEX MICROSCOPIC
Bilirubin Urine: NEGATIVE
Glucose, UA: NEGATIVE mg/dL
Hgb urine dipstick: NEGATIVE
Ketones, ur: NEGATIVE mg/dL
Leukocytes,Ua: NEGATIVE
Nitrite: NEGATIVE
Protein, ur: NEGATIVE mg/dL
Specific Gravity, Urine: 1.015 (ref 1.005–1.030)
pH: 6 (ref 5.0–8.0)

## 2019-10-16 LAB — CBC
HCT: 42.7 % (ref 36.0–46.0)
Hemoglobin: 13.8 g/dL (ref 12.0–15.0)
MCH: 30.9 pg (ref 26.0–34.0)
MCHC: 32.3 g/dL (ref 30.0–36.0)
MCV: 95.5 fL (ref 80.0–100.0)
Platelets: 295 10*3/uL (ref 150–400)
RBC: 4.47 MIL/uL (ref 3.87–5.11)
RDW: 13.1 % (ref 11.5–15.5)
WBC: 6.4 10*3/uL (ref 4.0–10.5)
nRBC: 0 % (ref 0.0–0.2)

## 2019-10-16 LAB — DIFFERENTIAL
Abs Immature Granulocytes: 0.01 10*3/uL (ref 0.00–0.07)
Basophils Absolute: 0 10*3/uL (ref 0.0–0.1)
Basophils Relative: 0 %
Eosinophils Absolute: 0.1 10*3/uL (ref 0.0–0.5)
Eosinophils Relative: 2 %
Immature Granulocytes: 0 %
Lymphocytes Relative: 45 %
Lymphs Abs: 2.8 10*3/uL (ref 0.7–4.0)
Monocytes Absolute: 0.6 10*3/uL (ref 0.1–1.0)
Monocytes Relative: 10 %
Neutro Abs: 2.8 10*3/uL (ref 1.7–7.7)
Neutrophils Relative %: 43 %

## 2019-10-16 LAB — PROTIME-INR
INR: 1 (ref 0.8–1.2)
Prothrombin Time: 12.7 seconds (ref 11.4–15.2)

## 2019-10-16 LAB — RAPID URINE DRUG SCREEN, HOSP PERFORMED
Amphetamines: NOT DETECTED
Barbiturates: NOT DETECTED
Benzodiazepines: POSITIVE — AB
Cocaine: NOT DETECTED
Opiates: NOT DETECTED
Tetrahydrocannabinol: NOT DETECTED

## 2019-10-16 LAB — CBG MONITORING, ED: Glucose-Capillary: 115 mg/dL — ABNORMAL HIGH (ref 70–99)

## 2019-10-16 LAB — RESPIRATORY PANEL BY RT PCR (FLU A&B, COVID)
Influenza A by PCR: NEGATIVE
Influenza B by PCR: NEGATIVE
SARS Coronavirus 2 by RT PCR: NEGATIVE

## 2019-10-16 LAB — ETHANOL: Alcohol, Ethyl (B): <10 mg/dL (ref <10)

## 2019-10-16 LAB — APTT: aPTT: 27 seconds (ref 24–36)

## 2019-10-16 MED ORDER — ASPIRIN 325 MG PO TABS
325.0000 mg | ORAL_TABLET | Freq: Every day | ORAL | Status: DC
  Administered 2019-10-16 – 2019-10-22 (×6): 325 mg via ORAL
  Filled 2019-10-16 (×6): qty 1

## 2019-10-16 MED ORDER — ACETAMINOPHEN 325 MG PO TABS
650.0000 mg | ORAL_TABLET | ORAL | Status: DC | PRN
  Administered 2019-10-21 – 2019-10-22 (×2): 650 mg via ORAL
  Filled 2019-10-16 (×2): qty 2

## 2019-10-16 MED ORDER — CLOPIDOGREL BISULFATE 75 MG PO TABS
75.0000 mg | ORAL_TABLET | Freq: Every day | ORAL | Status: DC
  Administered 2019-10-17 – 2019-10-22 (×5): 75 mg via ORAL
  Filled 2019-10-16 (×5): qty 1

## 2019-10-16 MED ORDER — ACETAMINOPHEN 160 MG/5ML PO SOLN: 650.0000 mg | ORAL | Status: DC | PRN

## 2019-10-16 MED ORDER — ASPIRIN 300 MG RE SUPP: 300.0000 mg | Freq: Every day | RECTAL | Status: DC

## 2019-10-16 MED ORDER — CLOPIDOGREL BISULFATE 75 MG PO TABS
300.0000 mg | ORAL_TABLET | Freq: Once | ORAL | Status: AC
  Administered 2019-10-16: 20:00:00 300 mg via ORAL
  Filled 2019-10-16: qty 4

## 2019-10-16 MED ORDER — SODIUM CHLORIDE 0.9 % IV SOLN: INTRAVENOUS | Status: AC

## 2019-10-16 MED ORDER — ACETAMINOPHEN 650 MG RE SUPP: 650.0000 mg | RECTAL | Status: DC | PRN

## 2019-10-16 MED ORDER — STROKE: EARLY STAGES OF RECOVERY BOOK
Freq: Once | Status: DC
  Filled 2019-10-16: qty 1

## 2019-10-16 MED ORDER — IOHEXOL 350 MG/ML SOLN
75.0000 mL | Freq: Once | INTRAVENOUS | Status: AC | PRN
  Administered 2019-10-16: 75 mL via INTRAVENOUS

## 2019-10-16 NOTE — H&P (Addendum)
TRH H&P    Patient Demographics:    Denise Parrish, is a 86 y.o. female  MRN: [id number]  DOB - [birth year]  Admit Date - 10/16/2019  Referring MD/NP/PA:  Sedonia Small  Outpatient Primary MD for the patient is [name], MD  Patient coming from:  home  Chief complaint-  Slurred speech, left facial droop   HPI:    Denise Parrish  is a 86 y.o. female,  w hypertension , Dm2, R carotid stenosis, h/o mild cognitive impairment, presents with c/o slurred speech and left facial droop starting around 1845 today. Pt is currently back to baseline. Pt denies headache, vision change, cp, palp, sob, n/v, diarrhea, brbpr, dysuria, focal weakness, numbness, tingling.   At baseline, pt drives, and is able to perform all her ADLS.  FULL CODE per daughter  In ED, T 98 P 83, R 20, Bp 145/75 pox 96% on RA WT 75.6kg  CTA neck /head Right carotid system: Right common carotid artery patent from its origin to the bifurcation without stenosis. Bulky calcified plaque about the right bifurcation/proximal right ICA with associated stenosis of up to 75% by NASCET criteria. Right ICA otherwise widely patent distally to the skull base without stenosis, dissection or occlusion.  Left carotid system: Calcified plaque at the origin of the left CCA without significant stenosis. Left CCA widely patent distally to the bifurcation. Scattered mixed plaque about the left bifurcation without hemodynamically significant stenosis. Left ICA widely patent distally to the skull base without stenosis, dissection or occlusion.  Other neck: No other acute soft tissue abnormality within the neck. No adenopathy. Normal thyroid. Soft tissue lesions seen within both parotid glands measuring up to 9 mm on the left and 8 mm on the right noted, indeterminate, but felt to be of doubtful significance given patient age.  IMPRESSION: CT HEAD IMPRESSION:  1.  No acute intracranial abnormality identified. 2. Aspects equals 10/10. 3. Age-related cerebral atrophy with moderate chronic microvascular ischemic disease, with small remote left cerebellar infarct.  CTA HEAD AND NECK IMPRESSION:  1. Negative CTA for large vessel occlusion. 2. Bulky calcified plaque about the right carotid bifurcation with associated stenosis of up to 75% by NASCET criteria. 3. Short-segment severe stenosis at the left P1/P2 junction. 4. Mild for age atheromatous change elsewhere about the major arterial vasculature of the head and neck. No other high-grade or correctable stenosis.  Na 139, K 4.2,  Bun 17, Creatinine 1.12 Ast 24, Alt 20  Wbc 6.4, Hgb 13.8, Plt 295 INR 1.0  Urinalysis  Negative  UDS benzodiazepine positive  covid-19 pcr negative  teleneurology consulted by ED, recommended aspirin and also plavix load and plavix.   Pt given aspirin, and plavix 372m po x1 in the ED  Pt will be admitted for slurred speech, left facial droop, ? TIA r/o CVA     Review of systems:    In addition to the HPI above,  No Fever-chills, No Headache, No changes with Vision or hearing, No problems swallowing food or Liquids, No Chest pain, Cough or Shortness  of Breath, No Abdominal pain, No Nausea or Vomiting, bowel movements are regular, No Blood in stool or Urine, No dysuria, No new skin rashes or bruises, No new joints pains-aches,  No new weakness, tingling, numbness in any extremity, No recent weight gain or loss, No polyuria, polydypsia or polyphagia, No significant Mental Stressors.  All other systems reviewed and are negative.    Past History of the following :    Past Medical History:  Diagnosis Date  . Carotid stenosis, right 10/16/2019   75% on CTA neck  . Diabetes ([facility])   . Hypertension   . Mild cognitive impairment   . Pneumonia       Past Surgical History:  Procedure Laterality Date  . HIP ARTHROPLASTY Right   . TONSILLECTOMY          Social History:      Social History   Tobacco Use  . Smoking status: Never Smoker  . Smokeless tobacco: Never Used  Substance Use Topics  . Alcohol use: Never       Family History :     Family History  Problem Relation Age of Onset  . Cirrhosis Mother        no etoh use  . CAD Father        Home Medications:   Prior to Admission medications   Medication Sig Start Date End Date Taking? Authorizing Provider  ALPRAZolam Duanne Moron) 0.5 MG tablet Take 0.5 mg by mouth at bedtime. *May take one tablet three times daily as needed for anxiety**   Yes [provider]  aspirin EC 81 MG tablet Take 81 mg by mouth daily.   Yes [provider]  glimepiride (AMARYL) 1 MG tablet Take 1 mg by mouth daily with breakfast.   Yes [provider]  metoprolol tartrate (LOPRESSOR) 25 MG tablet Take 25 mg by mouth 2 (two) times daily.   Yes [provider]  omeprazole (PRILOSEC) 20 MG capsule Take 20 mg by mouth daily. *May take one capsule twice daily   Yes [provider]     Allergies:    No Known Allergies   Physical Exam:   Vitals  Blood pressure (!) 145/75, pulse 81, temperature 98 F (36.7 C), temperature source Oral, resp. rate (!) 23, height 5' 5" (1.651 m), weight 75.6 kg, SpO2 96 %.  1.  General: axoxo3  2. Psychiatric: euthymic  3. Neurologic: cn2-12 intact, reflexes 2+ symmetric, diffuse with downgoing toes bilaterally, motor 5/5 in all 4 ext Slight left pronator drift  4. HEENMT:  Anicteric, pupils 3.5 mm symmetric, direct, consensual, near intact Tongue midline, no facial droop Neck: no jvd, no bruit  5. Respiratory : CTAB  6. Cardiovascular : rrr s1, s2,   7. Gastrointestinal:  Abd: soft., nd, nt, +bs  8. Skin:  Ext: no c/c/e, no rash, onychomycosis  9.Musculoskeletal:  Good ROM    Data Review:    CBC Recent Labs  Lab 10/16/19 1903  WBC 6.4  HGB 13.8  HCT 42.7  PLT 295  MCV 95.5  MCH  30.9  MCHC 32.3  RDW 13.1  LYMPHSABS 2.8  MONOABS 0.6  EOSABS 0.1  BASOSABS 0.0   ------------------------------------------------------------------------------------------------------------------  Results for orders placed or performed during the hospital encounter of 10/16/19 (from the past 48 hour(s))  CBG monitoring, ED     Status: Abnormal   Collection Time: 10/16/19  6:56 PM  Result Value Ref Range   Glucose-Capillary 115 (H) 70 - 99 mg/dL  Ethanol     Status: None   Collection Time: 10/16/19  7:02 PM  Result Value Ref Range   Alcohol, Ethyl (B) <10 <10 mg/dL    Comment: (NOTE) Lowest detectable limit for serum alcohol is 10 mg/dL. For medical purposes only. Performed at [facility], [address], [place], [place] [address]   Protime-INR     Status: None   Collection Time: 10/16/19  7:03 PM  Result Value Ref Range   Prothrombin Time 12.7 11.4 - 15.2 seconds   INR 1.0 0.8 - 1.2    Comment: (NOTE) INR goal varies based on device and disease states. Performed at [facility], [address], [place], [place] [address]   APTT     Status: None   Collection Time: 10/16/19  7:03 PM  Result Value Ref Range   aPTT 27 24 - 36 seconds    Comment: Performed at [facility], [address], [place], [place] [address]  CBC     Status: None   Collection Time: 10/16/19  7:03 PM  Result Value Ref Range   WBC 6.4 4.0 - 10.5 K/uL   RBC 4.47 3.87 - 5.11 MIL/uL   Hemoglobin 13.8 12.0 - 15.0 g/dL   HCT 42.7 36.0 - 46.0 %   MCV 95.5 80.0 - 100.0 fL   MCH 30.9 26.0 - 34.0 pg   MCHC 32.3 30.0 - 36.0 g/dL   RDW 13.1 11.5 - 15.5 %   Platelets 295 150 - 400 K/uL   nRBC 0.0 0.0 - 0.2 %    Comment: Performed at [facility], [address], [place], [place] [address]  Differential     Status: None   Collection Time: 10/16/19  7:03 PM  Result Value Ref Range   Neutrophils Relative % 43 %   Neutro Abs 2.8 1.7 - 7.7 K/uL   Lymphocytes Relative 45 %   Lymphs Abs 2.8 0.7 - 4.0 K/uL    Monocytes Relative 10 %   Monocytes Absolute 0.6 0.1 - 1.0 K/uL   Eosinophils Relative 2 %   Eosinophils Absolute 0.1 0.0 - 0.5 K/uL   Basophils Relative 0 %   Basophils Absolute 0.0 0.0 - 0.1 K/uL   Immature Granulocytes 0 %   Abs Immature Granulocytes 0.01 0.00 - 0.07 K/uL    Comment: Performed at [facility], [address] [place], [place] [address]  Comprehensive metabolic panel     Status: Abnormal   Collection Time: 10/16/19  7:03 PM  Result Value Ref Range   Sodium 139 135 - 145 mmol/L   Potassium 4.2 3.5 - 5.1 mmol/L   Chloride 103 98 - 111 mmol/L   CO2 28 22 - 32 mmol/L   Glucose, Bld 122 (H) 70 - 99 mg/dL   BUN 17 8 - 23 mg/dL   Creatinine, Ser 1.12 (H) 0.44 - 1.00 mg/dL   Calcium 9.4 8.9 - 10.3 mg/dL   Total Protein 7.4 6.5 - 8.1 g/dL   Albumin 4.1 3.5 - 5.0 g/dL   AST 24 15 - 41 U/L   ALT 20 0 - 44 U/L   Alkaline Phosphatase 66 38 - 126 U/L   Total Bilirubin 0.8 0.3 - 1.2 mg/dL   GFR calc non Af Amer 42 (L) >60 mL/min   GFR calc Af Amer 49 (L) >60 mL/min   Anion gap 8 5 - 15    Comment: Performed at [facility], [address], [place], [place] [address]  Respiratory Panel by  RT PCR (Flu A&B, Covid) - Nasopharyngeal Swab     Status: None   Collection Time: 10/16/19  7:06 PM   Specimen: Nasopharyngeal Swab  Result Value Ref Range   SARS Coronavirus 2 by RT PCR NEGATIVE NEGATIVE    Comment: (NOTE) SARS-CoV-2 target nucleic acids are NOT DETECTED. The SARS-CoV-2 RNA is generally detectable in upper respiratoy specimens during the acute phase of infection. The lowest concentration of SARS-CoV-2 viral copies this assay can detect is 131 copies/mL. A negative result does not preclude SARS-Cov-2 infection and should not be used as the sole basis for treatment or other patient management decisions. A negative result may occur with  improper specimen collection/handling, submission of specimen other than nasopharyngeal swab, presence of viral mutation(s) within  the areas targeted by this assay, and inadequate number of viral copies (<131 copies/mL). A negative result must be combined with clinical observations, patient history, and epidemiological information. The expected result is Negative. Fact Sheet for Patients:  PinkCheek.be Fact Sheet for Healthcare Providers:  GravelBags.it This test is not yet ap proved or cleared by the [place] FDA and  has been authorized for detection and/or diagnosis of SARS-CoV-2 by FDA under an Emergency Use Authorization (EUA). This EUA will remain  in effect (meaning this test can be used) for the duration of the COVID-19 declaration under Section 564(b)(1) of the Act, 21 U.S.C. section 360bbb-3(b)(1), unless the authorization is terminated or revoked sooner.    Influenza A by PCR NEGATIVE NEGATIVE   Influenza B by PCR NEGATIVE NEGATIVE    Comment: (NOTE) The Xpert Xpress SARS-CoV-2/FLU/RSV assay is intended as an aid in  the diagnosis of influenza from Nasopharyngeal swab specimens and  should not be used as a sole basis for treatment. Nasal washings and  aspirates are unacceptable for Xpert Xpress SARS-CoV-2/FLU/RSV  testing. Fact Sheet for Patients: PinkCheek.be Fact Sheet for Healthcare Providers: GravelBags.it This test is not yet approved or cleared by the [place] FDA and  has been authorized for detection and/or diagnosis of SARS-CoV-2 by  FDA under an Emergency Use Authorization (EUA). This EUA will remain  in effect (meaning this test can be used) for the duration of the  Covid-19 declaration under Section 564(b)(1) of the Act, 21  U.S.C. section 360bbb-3(b)(1), unless the authorization is  terminated or revoked. Performed at [facility], [address], [place], [place] [address]     Chemistries  Recent Labs  Lab 10/16/19 1903  NA 139  K 4.2  CL 103  CO2 28   GLUCOSE 122*  BUN 17  CREATININE 1.12*  CALCIUM 9.4  AST 24  ALT 20  ALKPHOS 66  BILITOT 0.8   ------------------------------------------------------------------------------------------------------------------  ------------------------------------------------------------------------------------------------------------------ GFR: Estimated Creatinine Clearance: 31.2 mL/min (A) (by C-G formula based on SCr of 1.12 mg/dL (H)). Liver Function Tests: Recent Labs  Lab 10/16/19 1903  AST 24  ALT 20  ALKPHOS 66  BILITOT 0.8  PROT 7.4  ALBUMIN 4.1   No results for input(s): LIPASE, AMYLASE in the last 168 hours. No results for input(s): AMMONIA in the last 168 hours. Coagulation Profile: Recent Labs  Lab 10/16/19 1903  INR 1.0   Cardiac Enzymes: No results for input(s): CKTOTAL, CKMB, CKMBINDEX, TROPONINI in the last 168 hours. BNP (last 3 results) No results for input(s): PROBNP in the last 8760 hours. HbA1C: No results for input(s): HGBA1C in the last 72 hours. CBG: Recent Labs  Lab 10/16/19 1856  GLUCAP 115*   Lipid Profile: No results  for input(s): CHOL, HDL, LDLCALC, TRIG, CHOLHDL, LDLDIRECT in the last 72 hours. Thyroid Function Tests: No results for input(s): TSH, T4TOTAL, FREET4, T3FREE, THYROIDAB in the last 72 hours. Anemia Panel: No results for input(s): VITAMINB12, FOLATE, FERRITIN, TIBC, IRON, RETICCTPCT in the last 72 hours.  --------------------------------------------------------------------------------------------------------------- Urine analysis: No results found for: COLORURINE, APPEARANCEUR, LABSPEC, PHURINE, GLUCOSEU, HGBUR, BILIRUBINUR, KETONESUR, PROTEINUR, UROBILINOGEN, NITRITE, LEUKOCYTESUR    Imaging Results:    CT Angio Head W or Wo Contrast  Result Date: 10/16/2019 CLINICAL DATA:  Initial evaluation for acute left facial weakness. EXAM: CT ANGIOGRAPHY HEAD AND NECK TECHNIQUE: Multidetector CT imaging of the head and neck was performed  using the standard protocol during bolus administration of intravenous contrast. Multiplanar CT image reconstructions and MIPs were obtained to evaluate the vascular anatomy. Carotid stenosis measurements (when applicable) are obtained utilizing NASCET criteria, using the distal internal carotid diameter as the denominator. CONTRAST:  100 cc of Omnipaque 350. COMPARISON:  None available. FINDINGS: CT HEAD FINDINGS Brain: Generalized age-related cerebral atrophy. Patchy and confluent hypodensity within the periventricular deep white matter both cerebral hemispheres most consistent with chronic small vessel ischemic disease, moderate in nature. Small remote left cerebellar infarct noted. No acute intracranial hemorrhage. No acute large vessel territory infarct. No mass lesion, midline shift or mass effect. No hydrocephalus. No extra-axial fluid collection. Vascular: No hyperdense vessel. Calcified atherosclerosis present at the skull base. Skull: Scalp soft tissues within normal limits.  Calvarium intact. Sinuses/Orbits: Globes and orbital soft tissues demonstrate no acute finding. Right gaze noted. Paranasal sinuses and mastoid air cells are clear. Other: None. ASPECTS ([place] Stroke Program Early CT Score) - Ganglionic level infarction (caudate, lentiform nuclei, internal capsule, insula, M1-M3 cortex): 7 - Supraganglionic infarction (M4-M6 cortex): 3 Total score (0-10 with 10 being normal): 10 CTA NECK FINDINGS Aortic arch: Visualized aortic arch of normal caliber with normal 3 vessel morphology. Moderate atherosclerotic change about the visualized arch and origin of the great vessels without hemodynamically significant stenosis. Visualized subclavian arteries widely patent. Right carotid system: Right common carotid artery patent from its origin to the bifurcation without stenosis. Bulky calcified plaque about the right bifurcation/proximal right ICA with associated stenosis of up to 75% by NASCET criteria. Right  ICA otherwise widely patent distally to the skull base without stenosis, dissection or occlusion. Left carotid system: Calcified plaque at the origin of the left CCA without significant stenosis. Left CCA widely patent distally to the bifurcation. Scattered mixed plaque about the left bifurcation without hemodynamically significant stenosis. Left ICA widely patent distally to the skull base without stenosis, dissection or occlusion. Vertebral arteries: Both vertebral arteries arise from the subclavian arteries. Vertebral arteries widely patent within the neck without stenosis, dissection or occlusion. Skeleton: No acute osseous abnormality. No discrete lytic or blastic osseous lesions. Prior ACDF at C4-5 noted. Patient is edentulous. Mild degenerative changes noted about the TMJs bilaterally. Other neck: No other acute soft tissue abnormality within the neck. No adenopathy. Normal thyroid. Soft tissue lesions seen within both parotid glands measuring up to 9 mm on the left and 8 mm on the right noted, indeterminate, but felt to be of doubtful significance given patient age. Upper chest: Visualized upper chest demonstrates no acute finding. Irregular biapical pleuroparenchymal thickening noted about the lung apices. Visualized lungs are otherwise clear. Review of the MIP images confirms the above findings CTA HEAD FINDINGS Anterior circulation: Right ICA widely patent through the siphon without stenosis. Scattered atheromatous plaque within the cavernous and supraclinoid left ICA  with no more than mild multifocal stenosis. A1 segments widely patent. Normal anterior communicating artery. Anterior cerebral arteries widely patent to their distal aspects without stenosis. No M1 stenosis or occlusion. Normal MCA bifurcations. Distal MCA branches well perfused and symmetric. Posterior circulation: Vertebral arteries widely patent to the vertebrobasilar junction without stenosis. Left vertebral dominant. Patent left PICA.  Right PICA not seen. Basilar widely patent to its distal aspect without stenosis. Superior cerebral arteries patent bilaterally. Left PCA primarily supplied via the basilar. Fetal type origin of the right PCA. Short-segment severe stenosis at the left P1/P2 junction (series 11, image 20). PCAs otherwise widely patent to their distal aspects. Venous sinuses: Patent. Anatomic variants: Fetal type origin of the right PCA. No intracranial aneurysm. Review of the MIP images confirms the above findings IMPRESSION: CT HEAD IMPRESSION: 1. No acute intracranial abnormality identified. 2. Aspects equals 10/10. 3. Age-related cerebral atrophy with moderate chronic microvascular ischemic disease, with small remote left cerebellar infarct. CTA HEAD AND NECK IMPRESSION: 1. Negative CTA for large vessel occlusion. 2. Bulky calcified plaque about the right carotid bifurcation with associated stenosis of up to 75% by NASCET criteria. 3. Short-segment severe stenosis at the left P1/P2 junction. 4. Mild for age atheromatous change elsewhere about the major arterial vasculature of the head and neck. No other high-grade or correctable stenosis. Critical Value/emergent results were called by telephone at the time of interpretation on 10/16/2019 at 7:27 pm to provider [facility] , who verbally acknowledged these results. Electronically Signed   By: Jeannine Boga M.D.   On: 10/16/2019 20:07   CT Angio Neck W and/or Wo Contrast  Result Date: 10/16/2019 CLINICAL DATA:  Initial evaluation for acute left facial weakness. EXAM: CT ANGIOGRAPHY HEAD AND NECK TECHNIQUE: Multidetector CT imaging of the head and neck was performed using the standard protocol during bolus administration of intravenous contrast. Multiplanar CT image reconstructions and MIPs were obtained to evaluate the vascular anatomy. Carotid stenosis measurements (when applicable) are obtained utilizing NASCET criteria, using the distal internal carotid diameter as the  denominator. CONTRAST:  100 cc of Omnipaque 350. COMPARISON:  None available. FINDINGS: CT HEAD FINDINGS Brain: Generalized age-related cerebral atrophy. Patchy and confluent hypodensity within the periventricular deep white matter both cerebral hemispheres most consistent with chronic small vessel ischemic disease, moderate in nature. Small remote left cerebellar infarct noted. No acute intracranial hemorrhage. No acute large vessel territory infarct. No mass lesion, midline shift or mass effect. No hydrocephalus. No extra-axial fluid collection. Vascular: No hyperdense vessel. Calcified atherosclerosis present at the skull base. Skull: Scalp soft tissues within normal limits.  Calvarium intact. Sinuses/Orbits: Globes and orbital soft tissues demonstrate no acute finding. Right gaze noted. Paranasal sinuses and mastoid air cells are clear. Other: None. ASPECTS ([place] Stroke Program Early CT Score) - Ganglionic level infarction (caudate, lentiform nuclei, internal capsule, insula, M1-M3 cortex): 7 - Supraganglionic infarction (M4-M6 cortex): 3 Total score (0-10 with 10 being normal): 10 CTA NECK FINDINGS Aortic arch: Visualized aortic arch of normal caliber with normal 3 vessel morphology. Moderate atherosclerotic change about the visualized arch and origin of the great vessels without hemodynamically significant stenosis. Visualized subclavian arteries widely patent. Right carotid system: Right common carotid artery patent from its origin to the bifurcation without stenosis. Bulky calcified plaque about the right bifurcation/proximal right ICA with associated stenosis of up to 75% by NASCET criteria. Right ICA otherwise widely patent distally to the skull base without stenosis, dissection or occlusion. Left carotid system: Calcified plaque at the  origin of the left CCA without significant stenosis. Left CCA widely patent distally to the bifurcation. Scattered mixed plaque about the left bifurcation without  hemodynamically significant stenosis. Left ICA widely patent distally to the skull base without stenosis, dissection or occlusion. Vertebral arteries: Both vertebral arteries arise from the subclavian arteries. Vertebral arteries widely patent within the neck without stenosis, dissection or occlusion. Skeleton: No acute osseous abnormality. No discrete lytic or blastic osseous lesions. Prior ACDF at C4-5 noted. Patient is edentulous. Mild degenerative changes noted about the TMJs bilaterally. Other neck: No other acute soft tissue abnormality within the neck. No adenopathy. Normal thyroid. Soft tissue lesions seen within both parotid glands measuring up to 9 mm on the left and 8 mm on the right noted, indeterminate, but felt to be of doubtful significance given patient age. Upper chest: Visualized upper chest demonstrates no acute finding. Irregular biapical pleuroparenchymal thickening noted about the lung apices. Visualized lungs are otherwise clear. Review of the MIP images confirms the above findings CTA HEAD FINDINGS Anterior circulation: Right ICA widely patent through the siphon without stenosis. Scattered atheromatous plaque within the cavernous and supraclinoid left ICA with no more than mild multifocal stenosis. A1 segments widely patent. Normal anterior communicating artery. Anterior cerebral arteries widely patent to their distal aspects without stenosis. No M1 stenosis or occlusion. Normal MCA bifurcations. Distal MCA branches well perfused and symmetric. Posterior circulation: Vertebral arteries widely patent to the vertebrobasilar junction without stenosis. Left vertebral dominant. Patent left PICA. Right PICA not seen. Basilar widely patent to its distal aspect without stenosis. Superior cerebral arteries patent bilaterally. Left PCA primarily supplied via the basilar. Fetal type origin of the right PCA. Short-segment severe stenosis at the left P1/P2 junction (series 11, image 20). PCAs otherwise  widely patent to their distal aspects. Venous sinuses: Patent. Anatomic variants: Fetal type origin of the right PCA. No intracranial aneurysm. Review of the MIP images confirms the above findings IMPRESSION: CT HEAD IMPRESSION: 1. No acute intracranial abnormality identified. 2. Aspects equals 10/10. 3. Age-related cerebral atrophy with moderate chronic microvascular ischemic disease, with small remote left cerebellar infarct. CTA HEAD AND NECK IMPRESSION: 1. Negative CTA for large vessel occlusion. 2. Bulky calcified plaque about the right carotid bifurcation with associated stenosis of up to 75% by NASCET criteria. 3. Short-segment severe stenosis at the left P1/P2 junction. 4. Mild for age atheromatous change elsewhere about the major arterial vasculature of the head and neck. No other high-grade or correctable stenosis. Critical Value/emergent results were called by telephone at the time of interpretation on 10/16/2019 at 7:27 pm to provider [facility] , who verbally acknowledged these results. Electronically Signed   By: Jeannine Boga M.D.   On: 10/16/2019 20:07   CT HEAD CODE STROKE WO CONTRAST  Result Date: 10/16/2019 CLINICAL DATA:  Initial evaluation for acute left facial weakness. EXAM: CT ANGIOGRAPHY HEAD AND NECK TECHNIQUE: Multidetector CT imaging of the head and neck was performed using the standard protocol during bolus administration of intravenous contrast. Multiplanar CT image reconstructions and MIPs were obtained to evaluate the vascular anatomy. Carotid stenosis measurements (when applicable) are obtained utilizing NASCET criteria, using the distal internal carotid diameter as the denominator. CONTRAST:  100 cc of Omnipaque 350. COMPARISON:  None available. FINDINGS: CT HEAD FINDINGS Brain: Generalized age-related cerebral atrophy. Patchy and confluent hypodensity within the periventricular deep white matter both cerebral hemispheres most consistent with chronic small vessel  ischemic disease, moderate in nature. Small remote left cerebellar infarct noted. No  acute intracranial hemorrhage. No acute large vessel territory infarct. No mass lesion, midline shift or mass effect. No hydrocephalus. No extra-axial fluid collection. Vascular: No hyperdense vessel. Calcified atherosclerosis present at the skull base. Skull: Scalp soft tissues within normal limits.  Calvarium intact. Sinuses/Orbits: Globes and orbital soft tissues demonstrate no acute finding. Right gaze noted. Paranasal sinuses and mastoid air cells are clear. Other: None. ASPECTS ([place] Stroke Program Early CT Score) - Ganglionic level infarction (caudate, lentiform nuclei, internal capsule, insula, M1-M3 cortex): 7 - Supraganglionic infarction (M4-M6 cortex): 3 Total score (0-10 with 10 being normal): 10 CTA NECK FINDINGS Aortic arch: Visualized aortic arch of normal caliber with normal 3 vessel morphology. Moderate atherosclerotic change about the visualized arch and origin of the great vessels without hemodynamically significant stenosis. Visualized subclavian arteries widely patent. Right carotid system: Right common carotid artery patent from its origin to the bifurcation without stenosis. Bulky calcified plaque about the right bifurcation/proximal right ICA with associated stenosis of up to 75% by NASCET criteria. Right ICA otherwise widely patent distally to the skull base without stenosis, dissection or occlusion. Left carotid system: Calcified plaque at the origin of the left CCA without significant stenosis. Left CCA widely patent distally to the bifurcation. Scattered mixed plaque about the left bifurcation without hemodynamically significant stenosis. Left ICA widely patent distally to the skull base without stenosis, dissection or occlusion. Vertebral arteries: Both vertebral arteries arise from the subclavian arteries. Vertebral arteries widely patent within the neck without stenosis, dissection or occlusion.  Skeleton: No acute osseous abnormality. No discrete lytic or blastic osseous lesions. Prior ACDF at C4-5 noted. Patient is edentulous. Mild degenerative changes noted about the TMJs bilaterally. Other neck: No other acute soft tissue abnormality within the neck. No adenopathy. Normal thyroid. Soft tissue lesions seen within both parotid glands measuring up to 9 mm on the left and 8 mm on the right noted, indeterminate, but felt to be of doubtful significance given patient age. Upper chest: Visualized upper chest demonstrates no acute finding. Irregular biapical pleuroparenchymal thickening noted about the lung apices. Visualized lungs are otherwise clear. Review of the MIP images confirms the above findings CTA HEAD FINDINGS Anterior circulation: Right ICA widely patent through the siphon without stenosis. Scattered atheromatous plaque within the cavernous and supraclinoid left ICA with no more than mild multifocal stenosis. A1 segments widely patent. Normal anterior communicating artery. Anterior cerebral arteries widely patent to their distal aspects without stenosis. No M1 stenosis or occlusion. Normal MCA bifurcations. Distal MCA branches well perfused and symmetric. Posterior circulation: Vertebral arteries widely patent to the vertebrobasilar junction without stenosis. Left vertebral dominant. Patent left PICA. Right PICA not seen. Basilar widely patent to its distal aspect without stenosis. Superior cerebral arteries patent bilaterally. Left PCA primarily supplied via the basilar. Fetal type origin of the right PCA. Short-segment severe stenosis at the left P1/P2 junction (series 11, image 20). PCAs otherwise widely patent to their distal aspects. Venous sinuses: Patent. Anatomic variants: Fetal type origin of the right PCA. No intracranial aneurysm. Review of the MIP images confirms the above findings IMPRESSION: CT HEAD IMPRESSION: 1. No acute intracranial abnormality identified. 2. Aspects equals 10/10. 3.  Age-related cerebral atrophy with moderate chronic microvascular ischemic disease, with small remote left cerebellar infarct. CTA HEAD AND NECK IMPRESSION: 1. Negative CTA for large vessel occlusion. 2. Bulky calcified plaque about the right carotid bifurcation with associated stenosis of up to 75% by NASCET criteria. 3. Short-segment severe stenosis at the left P1/P2 junction. 4.  Mild for age atheromatous change elsewhere about the major arterial vasculature of the head and neck. No other high-grade or correctable stenosis. Critical Value/emergent results were called by telephone at the time of interpretation on 10/16/2019 at 7:27 pm to provider [facility] , who verbally acknowledged these results. Electronically Signed   By: Jeannine Boga M.D.   On: 10/16/2019 20:07       Assessment & Plan:    Principal Problem:   TIA (transient ischemic attack) Active Problems:   Slurred speech   Facial droop   Essential hypertension   Diabetes (HCC)  Slurred speech, L facial droop TIA vs CVA tele Check MRI brain Check cardiac echo Check hga1c, lipid Check b12, folate, esr, tsh PT/OT/ speech consult Cont aspirin Cont Plavix at 84m po qday (loaded w 3081mpo x1 10/16/19) Lipitor 8064mo qhs Neurology consult ordered in computer, appreciate input  R carotid stenosis 75% Cont aspirin, plavix as above  Hypertension Cont Metoprolol 54m2m bid  Anxiety Cont Xanax 0.5mg 50mqhs  Dm2 Hold Amaryl Fsbs q4h, ISS  Gerd Cont PPI  DVT Prophylaxis-   SCDs  AM Labs Ordered, also please review Full Orders  Family Communication: Admission, patients condition and plan of care including tests being ordered have been discussed with the patient  who indicate understanding and agree with the plan and Code Status.  Code Status:  FULL CODE per patient, notified daughter that patient admitted to [facility]  Descansoission status:  Observation: Based on patients clinical presentation and evaluation of above  clinical data, I have made determination that patient meets Observation criteria at this time.     Time spent in minutes : 55 minutes   JamesJani Gravelon 10/16/2019 at 8:46 PM

## 2019-10-16 NOTE — Progress Notes (Signed)
CODE STROKE CT TIMES 1844 Beeper time 1908 Exam started 1909 Exam finished 1909 Images sent to [place] Exam Completed in [facility] called

## 2019-10-16 NOTE — ED Provider Notes (Signed)
[facility] Emergency Department Provider Note MRN:  [id number]  Arrival date & time: 10/16/19     Chief Complaint   Code Stroke   History of Present Illness   Denise Parrish is a 86 y.o. year-old female with no known past medical history presenting to the ED with chief complaint of code stroke.  Sudden onset of facial droop and speech deficits at 6:10 PM.  Witnessed by family.  Slow improvement in route with EMS.  Patient denies pain, currently without complaints.  Review of Systems  Positive for facial droop, speech  Patient's Health History   History reviewed. No pertinent past medical history.    History reviewed. No pertinent family history.  Social History   Socioeconomic History  . Marital status: Divorced    Spouse name: Not on file  . Number of children: Not on file  . Years of education: Not on file  . Highest education level: Not on file  Occupational History  . Not on file  Tobacco Use  . Smoking status: Never Smoker  . Smokeless tobacco: Never Used  Substance and Sexual Activity  . Alcohol use: Never  . Drug use: Never  . Sexual activity: Not Currently  Other Topics Concern  . Not on file  Social History Narrative  . Not on file   Social Determinants of Health   Financial Resource Strain:   . Difficulty of Paying Living Expenses: Not on file  Food Insecurity:   . Worried About Charity fundraiser in the Last Year: Not on file  . Ran Out of Food in the Last Year: Not on file  Transportation Needs:   . Lack of Transportation (Medical): Not on file  . Lack of Transportation (Non-Medical): Not on file  Physical Activity:   . Days of Exercise per Week: Not on file  . Minutes of Exercise per Session: Not on file  Stress:   . Feeling of Stress : Not on file  Social Connections:   . Frequency of Communication with Friends and Family: Not on file  . Frequency of Social Gatherings with Friends and Family: Not on file  .  Attends Religious Services: Not on file  . Active Member of Clubs or Organizations: Not on file  . Attends Archivist Meetings: Not on file  . Marital Status: Not on file  Intimate Partner Violence:   . Fear of Current or Ex-Partner: Not on file  . Emotionally Abused: Not on file  . Physically Abused: Not on file  . Sexually Abused: Not on file     Physical Exam   Vitals:   10/16/19 1854 10/16/19 1907  BP: (!) 145/75   Pulse: 83   Resp: (!) 22 18  Temp: 97.7 F (36.5 C) 98 F (36.7 C)  SpO2: 96%     CONSTITUTIONAL: Well-appearing, NAD NEURO:  Alert and oriented x 3, normal and symmetric strength and sensation to the arms and legs, subtle left-sided facial droop that appears to involve the brow and periorbital region, speech is slow but no obvious aphasia or slurred speech, no visual field cuts, no neglect. EYES:  eyes equal and reactive ENT/NECK:  no LAD, no JVD CARDIO: Regular rate, well-perfused, normal S1 and S2 PULM:  CTAB no wheezing or rhonchi GI/GU:  normal bowel sounds, non-distended, non-tender MSK/SPINE:  No gross deformities, no edema SKIN:  no rash, atraumatic PSYCH:  Appropriate speech and behavior  *Additional and/or pertinent findings included in MDM below  Diagnostic and Interventional Summary    EKG Interpretation  Date/Time:  Thursday October 16 2019 19:03:08 EST Ventricular Rate:  94 PR Interval:    QRS Duration: 81 QT Interval:  379 QTC Calculation: 443 R Axis:   55 Text Interpretation: Ectopic atrial rhythm Ventricular premature complex Short PR interval Repol abnrm  Confirmed by Gerlene Fee [phone]) on 10/16/2019 7:04:45 PM      Cardiac Monitoring Interpretation:  Labs Reviewed  COMPREHENSIVE METABOLIC PANEL - Abnormal; Notable for the following components:      Result Value   Glucose, Bld 122 (*)    Creatinine, Ser 1.12 (*)    GFR calc non Af Amer 42 (*)    GFR calc Af Amer 49 (*)    All other components within normal limits   CBG MONITORING, ED - Abnormal; Notable for the following components:   Glucose-Capillary 115 (*)    All other components within normal limits  RESPIRATORY PANEL BY RT PCR (FLU A&B, COVID)  ETHANOL  PROTIME-INR  APTT  CBC  DIFFERENTIAL  RAPID URINE DRUG SCREEN, HOSP PERFORMED  URINALYSIS, ROUTINE W REFLEX MICROSCOPIC    CT Angio Head W or Wo Contrast  Final Result    CT Angio Neck W and/or Wo Contrast  Final Result    CT HEAD CODE STROKE WO CONTRAST  Final Result      Medications  iohexol (OMNIPAQUE) 350 MG/ML injection 75 mL (75 mLs Intravenous Contrast Given 10/16/19 1930)  clopidogrel (PLAVIX) tablet 300 mg (300 mg Oral Given 10/16/19 2005)     Procedures  /  Critical Care .Critical Care Performed by: Maudie Flakes, MD Authorized by: Maudie Flakes, MD   Critical care provider statement:    Critical care time (minutes):  32   Critical care was necessary to treat or prevent imminent or life-threatening deterioration of the following conditions:  CNS failure or compromise (Concern for acute ischemic stroke, initiation of code stroke protocol)   Critical care was time spent personally by me on the following activities:  Discussions with consultants, evaluation of patient's response to treatment, examination of patient, ordering and performing treatments and interventions, ordering and review of laboratory studies, ordering and review of radiographic studies, pulse oximetry, re-evaluation of patient's condition, obtaining history from patient or surrogate and review of old charts    ED Course and Medical Decision Making  I have reviewed the triage vital signs, the nursing notes, and pertinent available records from the EMR.  Pertinent labs & imaging results that were available during my care of the patient were reviewed by me and considered in my medical decision making (see below for details).     Continued subtle left facial droop, on my first examination of the  patient I do not notice any obvious aphasia or dysarthria however patient's family is concerned that her speech is abnormal.  Code stroke initiated prior to arrival, will continue with protocol, CT imaging, telemetry neurology.  CT and CTA imaging of the head and neck are unremarkable.  Patient continues to look and feel well, continues to have very subtle left-sided facial droop including the forehead.  Discussed case with Dr. Barbee Shropshire of teleneurology, and we agree that patient is not a TPA candidate based on the very mild nature of her symptoms.  Bell's palsy is actually more likely given the exam.  However after discussing with family over the phone, they were concerned about fairly significant slurred speech, which seems to have resolved as patient feels that  she is speaking normally, she is fully alert and oriented, and I sense no slurred speech or aphasia on exam.  Dr. Barbee Shropshire recommending Plavix load and MRI to exclude acute stroke.  Will admit to hospitalist service here at [place] for TIA work-up and MRI in the morning.  Barth Kirks. [place] Small, [place] [email or web address]  Final Clinical Impressions(s) / ED Diagnoses     ICD-10-CM   1. Facial droop  R29.810   2. Speech disturbance, unspecified type  R47.9     ED Discharge Orders    None       Discharge Instructions Discussed with and Provided to Patient:   Discharge Instructions   None       Maudie Flakes, MD 10/16/19 2010

## 2019-10-16 NOTE — Progress Notes (Signed)
[facility] Tele[facility]rology Consult Services   Date of Service:   10/16/2019 19:21:32  Impression:     .  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 NIHSS 1, likely small vessel right frontal infarct although there was some forehead involvement which may point to a more peripheral, LMN lesion such as Bells palsy. Given mild and nondisabling deficits on exam, IV alteplase carries more risk than potential benefit, would recommend conservative management, dual antiplatelet therapy and stroke workup. d/w ER physician who agrees with the plan of care.  Metrics: Last Known Well: 10/16/2019 18:00:00 [facility] Notification Time: 10/16/2019 19:21:32 Arrival Time: 10/16/2019 18:53:00 Stamp Time: 10/16/2019 19:21:32 Time First Login Attempt: 10/16/2019 19:27:32 Symptoms: slurred speech, facial droop NIHSS Start Assessment Time: 10/16/2019 19:29:01 Patient is not a candidate for Alteplase/Activase. Patient was not deemed candidate for Alteplase/Activase thrombolytics because of very mild deficits, risks outweigh benefit.  CT head showed no acute hemorrhage or acute core infarct. CT head was reviewed.  Clinical Presentation is not Suggestive of Large Vessel Occlusive Disease  ED Physician notified of diagnostic impression and management plan on 10/16/2019 19:47:15  Our recommendations are outlined below.  Recommendations:     .  Activate Stroke Protocol Admission/Order Set     .  Stroke/Telemetry Floor     .  Neuro Checks     .  Bedside Swallow Eval     .  DVT Prophylaxis     .  IV Fluids, Normal Saline     .  Head of Bed 30 Degrees     .  Euglycemia and Avoid Hyperthermia (PRN Acetaminophen)     .  asa and plavix load and then 64m daily     .  MRI and routine stroke workup   Sign Out:     .   Discussed with Emergency Department Provider    ------------------------------------------------------------------------------  History of Present Illness: Patient is a 86year old Female.  Patient was brought by EMS for symptoms of slurred speech, facial droop  6pm-family noted slurred speech, facial droop and confusion, she was drooling as while they were having dinner. No reported weakness and numbness. Patient who makes all her own medical decisions, states she does not feel her speech is altered and denies any other symptoms. She states her vision is blurry but she was at the ophthalmologist today and was dilated. She has history of a heart murmur and no other PMH. on asa only, no anticoagulants.  Last seen normal was within 4.5 hours. There is no history of hemorrhagic complications or intracranial hemorrhage. There is no history of Recent Anticoagulants. There is no history of recent major surgery. There is no history of recent stroke.  Anticoagulant use:  No  Antiplatelet use: asa 833m   Examination: BP(145/75), Pulse(NSR), Blood Glucose(122) 1A: Level of Consciousness - Alert; keenly responsive + 0 1B: Ask Month and Age - Both Questions Right + 0 1C: Blink Eyes & Squeeze Hands - Performs Both Tasks + 0 2: Test Horizontal Extraocular Movements - Normal + 0 3: Test Visual Fields - No Visual Loss + 0 4: Test Facial Palsy (Use Grimace if Obtunded) - Minor paralysis (flat nasolabial fold, smile asymmetry) + 1 5A: Test Left Arm Motor Drift - No Drift for 10 Seconds + 0 5B: Test Right Arm Motor Drift - No Drift for 10 Seconds +  0 6A: Test Left Leg Motor Drift - No Drift for 5 Seconds + 0 6B: Test Right Leg Motor Drift - No Drift for 5 Seconds + 0 7: Test Limb Ataxia (FNF/Heel-Shin) - No Ataxia + 0 8: Test Sensation - Normal; No sensory loss + 0 9: Test Language/Aphasia - Normal; No aphasia + 0 10: Test Dysarthria - Normal + 0 11: Test Extinction/Inattention - No  abnormality + 0  NIHSS Score: 1  Pre-Morbid Modified Ranking Scale: 0 Points = No symptoms at all  Patient/Family was informed the Neurology Consult would occur via TeleHealth consult by way of interactive audio and video telecommunications and consented to receiving care in this manner.   Due to the immediate potential for life-threatening deterioration due to underlying acute neurologic illness, I spent 25 minutes providing critical care. This time includes time for face to face visit via telemedicine, review of medical records, imaging studies and discussion of findings with providers, the patient and/or family.   Dr Jacqulynn Cadet Simone Tuckey   [facility] [phone]  Case [id number]

## 2019-10-16 NOTE — ED Triage Notes (Signed)
Pt arrived via [facility] emergency traffic for code stroke. LKN apprx 1810 per family. Family reported to to EMS they noticed left facial droop, slurred speech and confusion. Upon arrival, EMS reports symptoms beginning to resolve.

## 2019-10-16 NOTE — ED Notes (Signed)
Pt transported to CT ?

## 2019-10-16 NOTE — ED Notes (Signed)
Pt transported to MRI 

## 2019-10-17 ENCOUNTER — Observation Stay (HOSPITAL_COMMUNITY): Payer: Medicare Other

## 2019-10-17 DIAGNOSIS — I6521 Occlusion and stenosis of right carotid artery: Secondary | ICD-10-CM | POA: Diagnosis present

## 2019-10-17 DIAGNOSIS — Z8249 Family history of ischemic heart disease and other diseases of the circulatory system: Secondary | ICD-10-CM | POA: Diagnosis not present

## 2019-10-17 DIAGNOSIS — I342 Nonrheumatic mitral (valve) stenosis: Secondary | ICD-10-CM | POA: Diagnosis not present

## 2019-10-17 DIAGNOSIS — H9190 Unspecified hearing loss, unspecified ear: Secondary | ICD-10-CM | POA: Diagnosis present

## 2019-10-17 DIAGNOSIS — G459 Transient cerebral ischemic attack, unspecified: Secondary | ICD-10-CM | POA: Diagnosis present

## 2019-10-17 DIAGNOSIS — I639 Cerebral infarction, unspecified: Secondary | ICD-10-CM | POA: Diagnosis not present

## 2019-10-17 DIAGNOSIS — Z96641 Presence of right artificial hip joint: Secondary | ICD-10-CM | POA: Diagnosis present

## 2019-10-17 DIAGNOSIS — I10 Essential (primary) hypertension: Secondary | ICD-10-CM | POA: Diagnosis present

## 2019-10-17 DIAGNOSIS — E118 Type 2 diabetes mellitus with unspecified complications: Secondary | ICD-10-CM | POA: Diagnosis not present

## 2019-10-17 DIAGNOSIS — F419 Anxiety disorder, unspecified: Secondary | ICD-10-CM | POA: Diagnosis present

## 2019-10-17 DIAGNOSIS — E1169 Type 2 diabetes mellitus with other specified complication: Secondary | ICD-10-CM | POA: Diagnosis not present

## 2019-10-17 DIAGNOSIS — G3184 Mild cognitive impairment, so stated: Secondary | ICD-10-CM | POA: Diagnosis present

## 2019-10-17 DIAGNOSIS — R471 Dysarthria and anarthria: Secondary | ICD-10-CM | POA: Diagnosis present

## 2019-10-17 DIAGNOSIS — I63511 Cerebral infarction due to unspecified occlusion or stenosis of right middle cerebral artery: Secondary | ICD-10-CM | POA: Diagnosis present

## 2019-10-17 DIAGNOSIS — E119 Type 2 diabetes mellitus without complications: Secondary | ICD-10-CM | POA: Diagnosis present

## 2019-10-17 DIAGNOSIS — R29701 NIHSS score 1: Secondary | ICD-10-CM | POA: Diagnosis present

## 2019-10-17 DIAGNOSIS — I361 Nonrheumatic tricuspid (valve) insufficiency: Secondary | ICD-10-CM

## 2019-10-17 DIAGNOSIS — Z79899 Other long term (current) drug therapy: Secondary | ICD-10-CM | POA: Diagnosis not present

## 2019-10-17 DIAGNOSIS — I6389 Other cerebral infarction: Secondary | ICD-10-CM | POA: Diagnosis not present

## 2019-10-17 DIAGNOSIS — R29810 Facial weakness: Secondary | ICD-10-CM | POA: Diagnosis present

## 2019-10-17 DIAGNOSIS — Z7982 Long term (current) use of aspirin: Secondary | ICD-10-CM | POA: Diagnosis not present

## 2019-10-17 DIAGNOSIS — K219 Gastro-esophageal reflux disease without esophagitis: Secondary | ICD-10-CM | POA: Diagnosis present

## 2019-10-17 DIAGNOSIS — Z20822 Contact with and (suspected) exposure to covid-19: Secondary | ICD-10-CM | POA: Diagnosis present

## 2019-10-17 LAB — COMPREHENSIVE METABOLIC PANEL
ALT: 17 U/L (ref 0–44)
AST: 19 U/L (ref 15–41)
Albumin: 3.4 g/dL — ABNORMAL LOW (ref 3.5–5.0)
Alkaline Phosphatase: 52 U/L (ref 38–126)
Anion gap: 10 (ref 5–15)
BUN: 17 mg/dL (ref 8–23)
CO2: 25 mmol/L (ref 22–32)
Calcium: 8.8 mg/dL — ABNORMAL LOW (ref 8.9–10.3)
Chloride: 105 mmol/L (ref 98–111)
Creatinine, Ser: 1.03 mg/dL — ABNORMAL HIGH (ref 0.44–1.00)
GFR calc Af Amer: 54 mL/min — ABNORMAL LOW (ref >60)
GFR calc non Af Amer: 46 mL/min — ABNORMAL LOW (ref >60)
Glucose, Bld: 108 mg/dL — ABNORMAL HIGH (ref 70–99)
Potassium: 3.7 mmol/L (ref 3.5–5.1)
Sodium: 140 mmol/L (ref 135–145)
Total Bilirubin: 0.6 mg/dL (ref 0.3–1.2)
Total Protein: 5.9 g/dL — ABNORMAL LOW (ref 6.5–8.1)

## 2019-10-17 LAB — ECHOCARDIOGRAM COMPLETE
Height: 65 in
Weight: 2617.3 oz

## 2019-10-17 LAB — CBC
HCT: 36.4 % (ref 36.0–46.0)
Hemoglobin: 12 g/dL (ref 12.0–15.0)
MCH: 31.5 pg (ref 26.0–34.0)
MCHC: 33 g/dL (ref 30.0–36.0)
MCV: 95.5 fL (ref 80.0–100.0)
Platelets: 249 10*3/uL (ref 150–400)
RBC: 3.81 MIL/uL — ABNORMAL LOW (ref 3.87–5.11)
RDW: 13.2 % (ref 11.5–15.5)
WBC: 6.2 10*3/uL (ref 4.0–10.5)
nRBC: 0 % (ref 0.0–0.2)

## 2019-10-17 LAB — LIPID PANEL
Cholesterol: 154 mg/dL (ref 0–200)
HDL: 32 mg/dL — ABNORMAL LOW (ref >40)
LDL Cholesterol: 98 mg/dL (ref 0–99)
Total CHOL/HDL Ratio: 4.8 RATIO
Triglycerides: 120 mg/dL (ref <150)
VLDL: 24 mg/dL (ref 0–40)

## 2019-10-17 LAB — HEMOGLOBIN A1C
Hgb A1c MFr Bld: 6.1 % — ABNORMAL HIGH (ref 4.8–5.6)
Mean Plasma Glucose: 128.37 mg/dL

## 2019-10-17 MED ORDER — ALPRAZOLAM 0.5 MG PO TABS
0.5000 mg | ORAL_TABLET | Freq: Every day | ORAL | Status: DC
  Administered 2019-10-17 – 2019-10-21 (×5): 0.5 mg via ORAL
  Filled 2019-10-17 (×5): qty 1

## 2019-10-17 NOTE — Progress Notes (Signed)
*  PRELIMINARY RESULTS* Echocardiogram 2D Echocardiogram has been performed.  Leavy Cella 10/17/2019, 10:22 AM

## 2019-10-17 NOTE — Evaluation (Signed)
Occupational Therapy Evaluation Patient Details Name: Denise Parrish MRN: [id number] DOB: [birth year] Today's Date: 10/17/2019    History of Present Illness Denise Parrish  is a 86 y.o. female,  w hypertension , Dm2, R carotid stenosis, h/o mild cognitive impairment, presents with c/o slurred speech and left facial droop starting around 1845 today. Pt is currently back to baseline. Pt denies headache, vision change, cp, palp, sob, n/v, diarrhea, brbpr, dysuria, focal weakness, numbness, tingling.    Clinical Impression   Pt agreeable to OT evaluation, oriented x4 this am. Pt performing ADLs with supervision, min guard with initial mobility however improved to supervision and assist for managing IV pole during ADLs and functional mobility. Pt with equal BUE strength, coordination and sensation are intact. Pt appears to be at baseline for ADL completion, no further OT services required at this time.     Follow Up Recommendations  Supervision - Intermittent    Equipment Recommendations  None recommended by OT       Precautions / Restrictions Precautions Precautions: Fall Restrictions Weight Bearing Restrictions: No      Mobility Bed Mobility Overal bed mobility: Needs Assistance Bed Mobility: Supine to Sit     Supine to sit: Supervision        Transfers Overall transfer level: Needs assistance Equipment used: None Transfers: Sit to/from Stand;Stand Pivot Transfers Sit to Stand: Min guard Stand pivot transfers: Supervision                ADL either performed or assessed with clinical judgement   ADL Overall ADL's : Needs assistance/impaired     Grooming: Wash/dry hands;Supervision/safety;Standing Grooming Details (indicate cue type and reason): Pt standing at sink for hadn washing, no difficulty                 Toilet Transfer: Supervision/safety;Regular Toilet;Grab bars   Toileting- Clothing Manipulation and Hygiene:  Supervision/safety;Sitting/lateral lean;Sit to/from stand Toileting - Clothing Manipulation Details (indicate cue type and reason): Pt able to doff and donn depends without difficulty     Functional mobility during ADLs: Supervision/safety;Min guard       Vision Baseline Vision/History: Wears glasses Wears Glasses: At all times Patient Visual Report: No change from baseline Vision Assessment?: No apparent visual deficits            Pertinent Vitals/Pain Pain Assessment: No/denies pain     Hand Dominance Right   Extremity/Trunk Assessment Upper Extremity Assessment Upper Extremity Assessment: Generalized weakness(strength is 4-/5 in BUE; appears baseline)   Lower Extremity Assessment Lower Extremity Assessment: Defer to PT evaluation   Cervical / Trunk Assessment Cervical / Trunk Assessment: Normal   Communication Communication Communication: HOH(wears hearing aides)   Cognition Arousal/Alertness: Awake/alert Behavior During Therapy: WFL for tasks assessed/performed Overall Cognitive Status: Within Functional Limits for tasks assessed                                                Home Living Family/patient expects to be discharged to:: Private residence Living Arrangements: Alone Available Help at Discharge: Family;Available PRN/intermittently Type of Home: House Home Access: Stairs to enter [employer] of Steps: 4-5 Entrance Stairs-Rails: Right;Left;Can reach both Home Layout: One level     Bathroom Shower/Tub: Teacher, early years/pre: Standard     Home Equipment: Other (comment)(walking stick)   Additional Comments: Pt sponge bathes at baseline  Prior Functioning/Environment Level of Independence: Independent        Comments: Pt uses walking stick when outside. Is independent in ADLs and mobility; drives        OT Problem List: Decreased activity tolerance          AM-PAC OT "6 Clicks" Daily  Activity     Outcome Measure Help from another person eating meals?: None Help from another person taking care of personal grooming?: None Help from another person toileting, which includes using toliet, bedpan, or urinal?: A Little Help from another person bathing (including washing, rinsing, drying)?: None(sponge bathing) Help from another person to put on and taking off regular upper body clothing?: None Help from another person to put on and taking off regular lower body clothing?: A Little 6 Click Score: 22   End of Session Equipment Utilized During Treatment: Gait belt  Activity Tolerance: Patient tolerated treatment well Patient left: in chair;with call bell/phone within reach  OT Visit Diagnosis: Muscle weakness (generalized) (M62.81)                Time: [id number] OT Time Calculation (min): 25 min Charges:  OT General Charges $OT Visit: 1 Visit OT Evaluation $OT Eval Low Complexity: [place], OTR/L  [phone] 10/17/2019, 8:43 AM

## 2019-10-17 NOTE — Evaluation (Signed)
Speech Language Pathology Evaluation Patient Details Name: Denise Parrish MRN: [id number] DOB: Mar 01, 1923 Today's Date: 10/17/2019 Time: [id number] SLP Time Calculation (min) (ACUTE ONLY): 28 min  Problem List:  Patient Active Problem List   Diagnosis Date Noted  . TIA (transient ischemic attack) 10/16/2019  . Slurred speech 10/16/2019  . Facial droop 10/16/2019  . Essential hypertension 10/16/2019  . Diabetes ([place]) 10/16/2019  . Stroke [facility]) 10/16/2019   Past Medical History:  Past Medical History:  Diagnosis Date  . Carotid stenosis, right 10/16/2019   75% on CTA neck  . Diabetes ([place])   . Hypertension   . Mild cognitive impairment   . Pneumonia    Past Surgical History:  Past Surgical History:  Procedure Laterality Date  . HIP ARTHROPLASTY Right   . TONSILLECTOMY     HPI:  Denise Parrish  is a 86 y.o. female,  w hypertension , Dm2, R carotid stenosis, h/o mild cognitive impairment, presents with c/o slurred speech and left facial droop starting around 1845 today. Pt is currently back to baseline. Pt denies headache, vision change, cp, palp, sob, n/v, diarrhea, brbpr, dysuria, focal weakness, numbness, tingling. MRI reveals "Patchy acute infarct in the Right MCA territory: right frontal"   Assessment / Plan / Recommendation Clinical Impression  Pt presents with cognition at or close to baseline and MILD dysarthria; Pt demonstrated delayed recall of words with 100% accuracy. Pt still drives, lives at home alone and is quite independent. She reported her entire medication list to SLP including vitamins she has initiated since the beginning of COVID-19. She does still present with a slight Left facial droop and L facial weakness; she reports slight continued drooling on the left and "really noticing it during breakfast". Mild dysarthria with intelligibility noted between 90-100% intelligible. SLP instructed Pt on oral motor strengthening exercises for labial strength and ROM.  Pt has systems in place with her daughter at home to help with her bills and finances. There are no further ST needs noted at this time, ST to sign off.     SLP Assessment  SLP Recommendation/Assessment: Patient does not need any further Speech Lanaguage Pathology Services SLP Visit Diagnosis: Dysarthria and anarthria (R47.1)    Follow Up Recommendations  None          SLP Evaluation Cognition  Overall Cognitive Status: Within Functional Limits for tasks assessed Orientation Level: Oriented X4 Memory Recall Sock: Without Cue Memory Recall Blue: Without Cue Memory Recall Bed: Without Cue Awareness: Appears intact Problem Solving: Appears intact       Comprehension  Auditory Comprehension Overall Auditory Comprehension: Appears within functional limits for tasks assessed Yes/No Questions: Within Functional Limits Commands: Within Functional Limits Reading Comprehension Reading Status: Not tested    Expression Expression Primary Mode of Expression: Verbal Written Expression Dominant Hand: Right   Oral / Motor  Oral Motor/Sensory Function Overall Oral Motor/Sensory Function: Mild impairment Facial ROM: Reduced left Facial Symmetry: Abnormal symmetry left Facial Strength: Reduced left Facial Sensation: Within Functional Limits Lingual ROM: Within Functional Limits Motor Speech Overall Motor Speech: Impaired(Very Mild Dysarthria) Articulation: Impaired Level of Impairment: Conversation Intelligibility: Intelligibility reduced Word: 75-100% accurate Phrase: 75-100% accurate Sentence: 75-100% accurate Conversation: 75-100% accurate                      Amelia H. Roddie Mc, CCC-SLP Speech Language Pathologist  Wende Bushy 10/17/2019, 10:29 AM

## 2019-10-17 NOTE — Progress Notes (Addendum)
PROGRESS NOTE    Denise Parrish  TIR:[id number] DOB: 10-Aug-1923 DOA: 10/16/2019 PCP: [name], MD   Brief Narrative:  Per HPI: Denise Parrish  is a 86 y.o. female,  w hypertension , Dm2, R carotid stenosis, h/o mild cognitive impairment, presents with c/o slurred speech and left facial droop starting around 1845 today. Pt is currently back to baseline. Pt denies headache, vision change, cp, palp, sob, n/v, diarrhea, brbpr, dysuria, focal weakness, numbness, tingling.   At baseline, pt drives, and is able to perform all her ADLS.  FULL CODE per daughter  1/29: Patient was admitted with right MCA distribution CVA with noted slurred speech and left-sided facial droop.  Her symptoms are resolved this morning.  She is noted to have 75% right carotid occlusion and will likely require vascular surgery follow-up in outpatient setting.  Neurology consultation pending.  2D echocardiogram performed with results pending.  Neurology recommends transfer to [facility] for consideration of urgent carotid endarterectomy. Discussed with vascular surgery Dr. Donzetta Matters who agrees and will see in consultation.  Assessment & Plan:   Principal Problem:   Stroke [facility]) Active Problems:   TIA (transient ischemic attack)   Slurred speech   Facial droop   Essential hypertension   Diabetes (HCC)   CVA (cerebral vascular accident) ([place])   Slurred speech, L facial droop secondary to patchy right MCA distribution CVA Check cardiac echo-performed with results pending Hemoglobin A1c 6.1% and LDL 98 Check b12, folate, esr, tsh PT/OT/ speech consult Cont aspirin Cont Plavix at 29m po qday (loaded w 3054mpo x1 10/16/19) Lipitor 8021mo qhs Neurology consult appreciated with recommendations for transfer  R carotid stenosis 75% Cont aspirin, plavix as above Continue statin [facility]  Hypertension Holding metoprolol for now  Anxiety Cont Xanax 0.5mg89m  qhs  Dm2 Hold Amaryl Fsbs q4h, ISS  Gerd Cont PPI   DVT prophylaxis: SCDs Code Status: Full Family Communication: Informed daughter on phone [facility]position Plan[facility]   Consultants:   Neurology  Vascular surgery  Procedures:   As noted below  Antimicrobials:   None   Subjective: Patient seen and evaluated today with no new acute complaints or concerns. No acute concerns or events noted overnight.  She appears to be eating breakfast without any difficulty and does not have any slurring of her speech.  Objective: Vitals:   10/17/19 0238 10/17/19 0430 10/17/19 0637 10/17/19 1014  BP: (!) 122/59 104/65 (!) 107/50 (!) 149/73  Pulse: 64 78 (!) 59 87  Resp: 18 18 18 18   Temp: 97.7 F (36.5 C) 97.7 F (36.5 C) 97.8 F (36.6 C) 98 F (36.7 C)  TempSrc: Oral Oral Oral Oral  SpO2: 95% 98% 98% 98%  Weight:      Height:        Intake/Output Summary (Last 24 hours) at 10/17/2019 1105 Last data filed at 10/17/2019 0900 Gross per 24 hour  Intake 683.03 ml  Output --  Net 683.03 ml   Filed Weights   10/16/19 1944 10/16/19 2225  Weight: 75.6 kg 74.2 kg    Examination:  General exam: Appears calm and comfortable, hard of hearing Respiratory system: Clear to auscultation. Respiratory effort normal. Cardiovascular system: S1 & S2 heard, RRR. No JVD, murmurs, rubs, gallops or clicks. No pedal edema. Gastrointestinal system: Abdomen is nondistended, soft and nontender. No organomegaly or masses felt. Normal bowel sounds heard. Central nervous  system: Alert and oriented. No focal neurological deficits. Extremities: Symmetric 5 x 5 power. Skin: No rashes, lesions or ulcers Psychiatry: Judgement and insight appear normal. Mood & affect appropriate.     Data Reviewed: I have personally reviewed following labs and imaging studies  CBC: Recent Labs  Lab 10/16/19 1903  10/17/19 0501  WBC 6.4 6.2  NEUTROABS 2.8  --   HGB 13.8 12.0  HCT 42.7 36.4  MCV 95.5 95.5  PLT 295 409   Basic Metabolic Panel: Recent Labs  Lab 10/16/19 1903 10/17/19 0501  NA 139 140  K 4.2 3.7  CL 103 105  CO2 28 25  GLUCOSE 122* 108*  BUN 17 17  CREATININE 1.12* 1.03*  CALCIUM 9.4 8.8*   GFR: Estimated Creatinine Clearance: 33.7 mL/min (A) (by C-G formula based on SCr of 1.03 mg/dL (H)). Liver Function Tests: Recent Labs  Lab 10/16/19 1903 10/17/19 0501  AST 24 19  ALT 20 17  ALKPHOS 66 52  BILITOT 0.8 0.6  PROT 7.4 5.9*  ALBUMIN 4.1 3.4*   No results for input(s): LIPASE, AMYLASE in the last 168 hours. No results for input(s): AMMONIA in the last 168 hours. Coagulation Profile: Recent Labs  Lab 10/16/19 1903  INR 1.0   Cardiac Enzymes: No results for input(s): CKTOTAL, CKMB, CKMBINDEX, TROPONINI in the last 168 hours. BNP (last 3 results) No results for input(s): PROBNP in the last 8760 hours. HbA1C: Recent Labs    10/17/19 0501  HGBA1C 6.1*   CBG: Recent Labs  Lab 10/16/19 1856  GLUCAP 115*   Lipid Profile: Recent Labs    10/17/19 0501  CHOL 154  HDL 32*  LDLCALC 98  TRIG 120  CHOLHDL 4.8   Thyroid Function Tests: No results for input(s): TSH, T4TOTAL, FREET4, T3FREE, THYROIDAB in the last 72 hours. Anemia Panel: No results for input(s): VITAMINB12, FOLATE, FERRITIN, TIBC, IRON, RETICCTPCT in the last 72 hours. Sepsis Labs: No results for input(s): PROCALCITON, LATICACIDVEN in the last 168 hours.  Recent Results (from the past 240 hour(s))  Respiratory Panel by RT PCR (Flu A&B, Covid) - Nasopharyngeal Swab     Status: None   Collection Time: 10/16/19  7:06 PM   Specimen: Nasopharyngeal Swab  Result Value Ref Range Status   SARS Coronavirus 2 by RT PCR NEGATIVE NEGATIVE Final    Comment: (NOTE) SARS-CoV-2 target nucleic acids are NOT DETECTED. The SARS-CoV-2 RNA is generally detectable in upper respiratoy specimens during  the acute phase of infection. The lowest concentration of SARS-CoV-2 viral copies this assay can detect is 131 copies/mL. A negative result does not preclude SARS-Cov-2 infection and should not be used as the sole basis for treatment or other patient management decisions. A negative result may occur with  improper specimen collection/handling, submission of specimen other than nasopharyngeal swab, presence of viral mutation(s) within the areas targeted by this assay, and inadequate number of viral copies (<131 copies/mL). A negative result must be combined with clinical observations, patient history, and epidemiological information. The expected result is Negative. Fact Sheet for Patients:  PinkCheek.be Fact Sheet for Healthcare Providers:  GravelBags.it This test is not yet ap proved or cleared by the [place] FDA and  has been authorized for detection and/or diagnosis of SARS-CoV-2 by FDA under an Emergency Use Authorization (EUA). This EUA will remain  in effect (meaning this test can be used) for the duration of the COVID-19 declaration under Section 564(b)(1) of the Act, 21 U.S.C. section 360bbb-3(b)(1), unless the  authorization is terminated or revoked sooner.    Influenza A by PCR NEGATIVE NEGATIVE Final   Influenza B by PCR NEGATIVE NEGATIVE Final    Comment: (NOTE) The Xpert Xpress SARS-CoV-2/FLU/RSV assay is intended as an aid in  the diagnosis of influenza from Nasopharyngeal swab specimens and  should not be used as a sole basis for treatment. Nasal washings and  aspirates are unacceptable for Xpert Xpress SARS-CoV-2/FLU/RSV  testing. Fact Sheet for Patients: PinkCheek.be Fact Sheet for Healthcare Providers: GravelBags.it This test is not yet approved or cleared by the [place] FDA and  has been authorized for detection and/or diagnosis of  SARS-CoV-2 by  FDA under an Emergency Use Authorization (EUA). This EUA will remain  in effect (meaning this test can be used) for the duration of the  Covid-19 declaration under Section 564(b)(1) of the Act, 21  U.S.C. section 360bbb-3(b)(1), unless the authorization is  terminated or revoked. Performed at [facility], [address] [place], [place] [address]          Radiology Studies: CT Angio Head W or Wo Contrast  Result Date: 10/16/2019 CLINICAL DATA:  Initial evaluation for acute left facial weakness. EXAM: CT ANGIOGRAPHY HEAD AND NECK TECHNIQUE: Multidetector CT imaging of the head and neck was performed using the standard protocol during bolus administration of intravenous contrast. Multiplanar CT image reconstructions and MIPs were obtained to evaluate the vascular anatomy. Carotid stenosis measurements (when applicable) are obtained utilizing NASCET criteria, using the distal internal carotid diameter as the denominator. CONTRAST:  100 cc of Omnipaque 350. COMPARISON:  None available. FINDINGS: CT HEAD FINDINGS Brain: Generalized age-related cerebral atrophy. Patchy and confluent hypodensity within the periventricular deep white matter both cerebral hemispheres most consistent with chronic small vessel ischemic disease, moderate in nature. Small remote left cerebellar infarct noted. No acute intracranial hemorrhage. No acute large vessel territory infarct. No mass lesion, midline shift or mass effect. No hydrocephalus. No extra-axial fluid collection. Vascular: No hyperdense vessel. Calcified atherosclerosis present at the skull base. Skull: Scalp soft tissues within normal limits.  Calvarium intact. Sinuses/Orbits: Globes and orbital soft tissues demonstrate no acute finding. Right gaze noted. Paranasal sinuses and mastoid air cells are clear. Other: None. ASPECTS ([place] Stroke Program Early CT Score) - Ganglionic level infarction (caudate, lentiform nuclei, internal capsule, insula,  M1-M3 cortex): 7 - Supraganglionic infarction (M4-M6 cortex): 3 Total score (0-10 with 10 being normal): 10 CTA NECK FINDINGS Aortic arch: Visualized aortic arch of normal caliber with normal 3 vessel morphology. Moderate atherosclerotic change about the visualized arch and origin of the great vessels without hemodynamically significant stenosis. Visualized subclavian arteries widely patent. Right carotid system: Right common carotid artery patent from its origin to the bifurcation without stenosis. Bulky calcified plaque about the right bifurcation/proximal right ICA with associated stenosis of up to 75% by NASCET criteria. Right ICA otherwise widely patent distally to the skull base without stenosis, dissection or occlusion. Left carotid system: Calcified plaque at the origin of the left CCA without significant stenosis. Left CCA widely patent distally to the bifurcation. Scattered mixed plaque about the left bifurcation without hemodynamically significant stenosis. Left ICA widely patent distally to the skull base without stenosis, dissection or occlusion. Vertebral arteries: Both vertebral arteries arise from the subclavian arteries. Vertebral arteries widely patent within the neck without stenosis, dissection or occlusion. Skeleton: No acute osseous abnormality. No discrete lytic or blastic osseous lesions. Prior ACDF at C4-5 noted. Patient is edentulous. Mild degenerative changes noted about the TMJs  bilaterally. Other neck: No other acute soft tissue abnormality within the neck. No adenopathy. Normal thyroid. Soft tissue lesions seen within both parotid glands measuring up to 9 mm on the left and 8 mm on the right noted, indeterminate, but felt to be of doubtful significance given patient age. Upper chest: Visualized upper chest demonstrates no acute finding. Irregular biapical pleuroparenchymal thickening noted about the lung apices. Visualized lungs are otherwise clear. Review of the MIP images confirms the  above findings CTA HEAD FINDINGS Anterior circulation: Right ICA widely patent through the siphon without stenosis. Scattered atheromatous plaque within the cavernous and supraclinoid left ICA with no more than mild multifocal stenosis. A1 segments widely patent. Normal anterior communicating artery. Anterior cerebral arteries widely patent to their distal aspects without stenosis. No M1 stenosis or occlusion. Normal MCA bifurcations. Distal MCA branches well perfused and symmetric. Posterior circulation: Vertebral arteries widely patent to the vertebrobasilar junction without stenosis. Left vertebral dominant. Patent left PICA. Right PICA not seen. Basilar widely patent to its distal aspect without stenosis. Superior cerebral arteries patent bilaterally. Left PCA primarily supplied via the basilar. Fetal type origin of the right PCA. Short-segment severe stenosis at the left P1/P2 junction (series 11, image 20). PCAs otherwise widely patent to their distal aspects. Venous sinuses: Patent. Anatomic variants: Fetal type origin of the right PCA. No intracranial aneurysm. Review of the MIP images confirms the above findings IMPRESSION: CT HEAD IMPRESSION: 1. No acute intracranial abnormality identified. 2. Aspects equals 10/10. 3. Age-related cerebral atrophy with moderate chronic microvascular ischemic disease, with small remote left cerebellar infarct. CTA HEAD AND NECK IMPRESSION: 1. Negative CTA for large vessel occlusion. 2. Bulky calcified plaque about the right carotid bifurcation with associated stenosis of up to 75% by NASCET criteria. 3. Short-segment severe stenosis at the left P1/P2 junction. 4. Mild for age atheromatous change elsewhere about the major arterial vasculature of the head and neck. No other high-grade or correctable stenosis. Critical Value/emergent results were called by telephone at the time of interpretation on 10/16/2019 at 7:27 pm to provider [facility] , who verbally acknowledged these  results. Electronically Signed   By: Jeannine Boga M.D.   On: 10/16/2019 20:07   DG Chest 2 View  Result Date: 10/16/2019 CLINICAL DATA:  TIA EXAM: CHEST - 2 VIEW COMPARISON:  10/05/2019 FINDINGS: Heart and mediastinal contours are within normal limits. No focal opacities or effusions. No acute bony abnormality. IMPRESSION: No active cardiopulmonary disease. Electronically Signed   By: Rolm Baptise M.D.   On: 10/16/2019 21:25   CT Angio Neck W and/or Wo Contrast  Result Date: 10/16/2019 CLINICAL DATA:  Initial evaluation for acute left facial weakness. EXAM: CT ANGIOGRAPHY HEAD AND NECK TECHNIQUE: Multidetector CT imaging of the head and neck was performed using the standard protocol during bolus administration of intravenous contrast. Multiplanar CT image reconstructions and MIPs were obtained to evaluate the vascular anatomy. Carotid stenosis measurements (when applicable) are obtained utilizing NASCET criteria, using the distal internal carotid diameter as the denominator. CONTRAST:  100 cc of Omnipaque 350. COMPARISON:  None available. FINDINGS: CT HEAD FINDINGS Brain: Generalized age-related cerebral atrophy. Patchy and confluent hypodensity within the periventricular deep white matter both cerebral hemispheres most consistent with chronic small vessel ischemic disease, moderate in nature. Small remote left cerebellar infarct noted. No acute intracranial hemorrhage. No acute large vessel territory infarct. No mass lesion, midline shift or mass effect. No hydrocephalus. No extra-axial fluid collection. Vascular: No hyperdense vessel. Calcified atherosclerosis present at  the skull base. Skull: Scalp soft tissues within normal limits.  Calvarium intact. Sinuses/Orbits: Globes and orbital soft tissues demonstrate no acute finding. Right gaze noted. Paranasal sinuses and mastoid air cells are clear. Other: None. ASPECTS ([place] Stroke Program Early CT Score) - Ganglionic level infarction (caudate,  lentiform nuclei, internal capsule, insula, M1-M3 cortex): 7 - Supraganglionic infarction (M4-M6 cortex): 3 Total score (0-10 with 10 being normal): 10 CTA NECK FINDINGS Aortic arch: Visualized aortic arch of normal caliber with normal 3 vessel morphology. Moderate atherosclerotic change about the visualized arch and origin of the great vessels without hemodynamically significant stenosis. Visualized subclavian arteries widely patent. Right carotid system: Right common carotid artery patent from its origin to the bifurcation without stenosis. Bulky calcified plaque about the right bifurcation/proximal right ICA with associated stenosis of up to 75% by NASCET criteria. Right ICA otherwise widely patent distally to the skull base without stenosis, dissection or occlusion. Left carotid system: Calcified plaque at the origin of the left CCA without significant stenosis. Left CCA widely patent distally to the bifurcation. Scattered mixed plaque about the left bifurcation without hemodynamically significant stenosis. Left ICA widely patent distally to the skull base without stenosis, dissection or occlusion. Vertebral arteries: Both vertebral arteries arise from the subclavian arteries. Vertebral arteries widely patent within the neck without stenosis, dissection or occlusion. Skeleton: No acute osseous abnormality. No discrete lytic or blastic osseous lesions. Prior ACDF at C4-5 noted. Patient is edentulous. Mild degenerative changes noted about the TMJs bilaterally. Other neck: No other acute soft tissue abnormality within the neck. No adenopathy. Normal thyroid. Soft tissue lesions seen within both parotid glands measuring up to 9 mm on the left and 8 mm on the right noted, indeterminate, but felt to be of doubtful significance given patient age. Upper chest: Visualized upper chest demonstrates no acute finding. Irregular biapical pleuroparenchymal thickening noted about the lung apices. Visualized lungs are otherwise  clear. Review of the MIP images confirms the above findings CTA HEAD FINDINGS Anterior circulation: Right ICA widely patent through the siphon without stenosis. Scattered atheromatous plaque within the cavernous and supraclinoid left ICA with no more than mild multifocal stenosis. A1 segments widely patent. Normal anterior communicating artery. Anterior cerebral arteries widely patent to their distal aspects without stenosis. No M1 stenosis or occlusion. Normal MCA bifurcations. Distal MCA branches well perfused and symmetric. Posterior circulation: Vertebral arteries widely patent to the vertebrobasilar junction without stenosis. Left vertebral dominant. Patent left PICA. Right PICA not seen. Basilar widely patent to its distal aspect without stenosis. Superior cerebral arteries patent bilaterally. Left PCA primarily supplied via the basilar. Fetal type origin of the right PCA. Short-segment severe stenosis at the left P1/P2 junction (series 11, image 20). PCAs otherwise widely patent to their distal aspects. Venous sinuses: Patent. Anatomic variants: Fetal type origin of the right PCA. No intracranial aneurysm. Review of the MIP images confirms the above findings IMPRESSION: CT HEAD IMPRESSION: 1. No acute intracranial abnormality identified. 2. Aspects equals 10/10. 3. Age-related cerebral atrophy with moderate chronic microvascular ischemic disease, with small remote left cerebellar infarct. CTA HEAD AND NECK IMPRESSION: 1. Negative CTA for large vessel occlusion. 2. Bulky calcified plaque about the right carotid bifurcation with associated stenosis of up to 75% by NASCET criteria. 3. Short-segment severe stenosis at the left P1/P2 junction. 4. Mild for age atheromatous change elsewhere about the major arterial vasculature of the head and neck. No other high-grade or correctable stenosis. Critical Value/emergent results were called by telephone at the  time of interpretation on 10/16/2019 at 7:27 pm to provider  [facility] , who verbally acknowledged these results. Electronically Signed   By: Jeannine Boga M.D.   On: 10/16/2019 20:07   MR BRAIN WO CONTRAST  Result Date: 10/16/2019 CLINICAL DATA:  86 year old female with code stroke presentation earlier today. Slurred speech, left facial droop, confusion. EXAM: MRI HEAD WITHOUT CONTRAST TECHNIQUE: Multiplanar, multiecho pulse sequences of the brain and surrounding structures were obtained without intravenous contrast. COMPARISON:  CT head, CTA head and neck earlier today. FINDINGS: Brain: Patchy restricted diffusion of the right frontal operculum and subcortical white matter tracking toward the right motor strip (series 12, image 37, and series 5, image 15). Faint T2 and FLAIR hyperintensity. No evidence of hemorrhage. No mass effect. No other restricted diffusion. Bilateral cerebral white matter confluent T2 and FLAIR hyperintensity with deep white matter capsule involvement. Small chronic infarcts in the cerebellum mostly the left (series 7, image 3). Comparatively mild T2 heterogeneity in the bilateral deep gray nuclei and brainstem. No chronic cortical encephalomalacia identified. No chronic cerebral blood products identified. No midline shift, mass effect, evidence of mass lesion, ventriculomegaly, extra-axial collection or acute intracranial hemorrhage. Cervicomedullary junction and pituitary are within normal limits. Vascular: Major intracranial vascular flow voids are preserved. Skull and upper cervical spine: Negative for age visible cervical spine. Visualized bone marrow signal is within normal limits. Sinuses/Orbits: Postoperative changes to both globes, otherwise negative orbits. Paranasal Visualized paranasal sinuses and mastoids are stable and well pneumatized. Other: Scalp and face soft tissues appear negative. IMPRESSION: 1. Patchy acute infarct in the Right MCA territory: right frontal operculum and middle frontal gyrus. No associated hemorrhage  or mass effect. 2. Underlying chronic ischemic disease mostly affecting the cerebral white matter and the cerebellum. Electronically Signed   By: Genevie Ann M.D.   On: 10/16/2019 21:43   CT HEAD CODE STROKE WO CONTRAST  Result Date: 10/16/2019 CLINICAL DATA:  Initial evaluation for acute left facial weakness. EXAM: CT ANGIOGRAPHY HEAD AND NECK TECHNIQUE: Multidetector CT imaging of the head and neck was performed using the standard protocol during bolus administration of intravenous contrast. Multiplanar CT image reconstructions and MIPs were obtained to evaluate the vascular anatomy. Carotid stenosis measurements (when applicable) are obtained utilizing NASCET criteria, using the distal internal carotid diameter as the denominator. CONTRAST:  100 cc of Omnipaque 350. COMPARISON:  None available. FINDINGS: CT HEAD FINDINGS Brain: Generalized age-related cerebral atrophy. Patchy and confluent hypodensity within the periventricular deep white matter both cerebral hemispheres most consistent with chronic small vessel ischemic disease, moderate in nature. Small remote left cerebellar infarct noted. No acute intracranial hemorrhage. No acute large vessel territory infarct. No mass lesion, midline shift or mass effect. No hydrocephalus. No extra-axial fluid collection. Vascular: No hyperdense vessel. Calcified atherosclerosis present at the skull base. Skull: Scalp soft tissues within normal limits.  Calvarium intact. Sinuses/Orbits: Globes and orbital soft tissues demonstrate no acute finding. Right gaze noted. Paranasal sinuses and mastoid air cells are clear. Other: None. ASPECTS ([place] Stroke Program Early CT Score) - Ganglionic level infarction (caudate, lentiform nuclei, internal capsule, insula, M1-M3 cortex): 7 - Supraganglionic infarction (M4-M6 cortex): 3 Total score (0-10 with 10 being normal): 10 CTA NECK FINDINGS Aortic arch: Visualized aortic arch of normal caliber with normal 3 vessel morphology.  Moderate atherosclerotic change about the visualized arch and origin of the great vessels without hemodynamically significant stenosis. Visualized subclavian arteries widely patent. Right carotid system: Right common carotid artery patent from its  origin to the bifurcation without stenosis. Bulky calcified plaque about the right bifurcation/proximal right ICA with associated stenosis of up to 75% by NASCET criteria. Right ICA otherwise widely patent distally to the skull base without stenosis, dissection or occlusion. Left carotid system: Calcified plaque at the origin of the left CCA without significant stenosis. Left CCA widely patent distally to the bifurcation. Scattered mixed plaque about the left bifurcation without hemodynamically significant stenosis. Left ICA widely patent distally to the skull base without stenosis, dissection or occlusion. Vertebral arteries: Both vertebral arteries arise from the subclavian arteries. Vertebral arteries widely patent within the neck without stenosis, dissection or occlusion. Skeleton: No acute osseous abnormality. No discrete lytic or blastic osseous lesions. Prior ACDF at C4-5 noted. Patient is edentulous. Mild degenerative changes noted about the TMJs bilaterally. Other neck: No other acute soft tissue abnormality within the neck. No adenopathy. Normal thyroid. Soft tissue lesions seen within both parotid glands measuring up to 9 mm on the left and 8 mm on the right noted, indeterminate, but felt to be of doubtful significance given patient age. Upper chest: Visualized upper chest demonstrates no acute finding. Irregular biapical pleuroparenchymal thickening noted about the lung apices. Visualized lungs are otherwise clear. Review of the MIP images confirms the above findings CTA HEAD FINDINGS Anterior circulation: Right ICA widely patent through the siphon without stenosis. Scattered atheromatous plaque within the cavernous and supraclinoid left ICA with no more than  mild multifocal stenosis. A1 segments widely patent. Normal anterior communicating artery. Anterior cerebral arteries widely patent to their distal aspects without stenosis. No M1 stenosis or occlusion. Normal MCA bifurcations. Distal MCA branches well perfused and symmetric. Posterior circulation: Vertebral arteries widely patent to the vertebrobasilar junction without stenosis. Left vertebral dominant. Patent left PICA. Right PICA not seen. Basilar widely patent to its distal aspect without stenosis. Superior cerebral arteries patent bilaterally. Left PCA primarily supplied via the basilar. Fetal type origin of the right PCA. Short-segment severe stenosis at the left P1/P2 junction (series 11, image 20). PCAs otherwise widely patent to their distal aspects. Venous sinuses: Patent. Anatomic variants: Fetal type origin of the right PCA. No intracranial aneurysm. Review of the MIP images confirms the above findings IMPRESSION: CT HEAD IMPRESSION: 1. No acute intracranial abnormality identified. 2. Aspects equals 10/10. 3. Age-related cerebral atrophy with moderate chronic microvascular ischemic disease, with small remote left cerebellar infarct. CTA HEAD AND NECK IMPRESSION: 1. Negative CTA for large vessel occlusion. 2. Bulky calcified plaque about the right carotid bifurcation with associated stenosis of up to 75% by NASCET criteria. 3. Short-segment severe stenosis at the left P1/P2 junction. 4. Mild for age atheromatous change elsewhere about the major arterial vasculature of the head and neck. No other high-grade or correctable stenosis. Critical Value/emergent results were called by telephone at the time of interpretation on 10/16/2019 at 7:27 pm to provider [facility] , who verbally acknowledged these results. Electronically Signed   By: Jeannine Boga M.D.   On: 10/16/2019 20:07        Scheduled Meds:   stroke: mapping our early stages of recovery book   Does not apply Once   ALPRAZolam  0.5  mg Oral QHS   aspirin  300 mg Rectal Daily   Or   aspirin  325 mg Oral Daily   clopidogrel  75 mg Oral Daily   Continuous Infusions:   LOS: 0 days    Time spent: 30 minutes    Shikha Bibb Darleen Crocker, DO [facility] Pager  [phone]  If 7PM-7AM, please contact night-coverage [email or web address] Password [facility] 10/17/2019, 11:05 AM

## 2019-10-17 NOTE — Consult Note (Signed)
[place] A. Merlene Laughter, MD     [email or web address]          Denise Parrish is an 86 y.o. female.   ASSESSMENT/PLAN: 1.  Acute dysarthria and left facial weakness due to symptomatic right carotid extracranial disease: Dual antiplatelet agents are recommended in the short-term but the patient should have urgent carotid endarterectomy.  A statin is also recommended.  Vascular surgery input is recommended.  She is highly functional despite her advanced age.  Risk factors for stroke hypertension, diabetes and age.  The patient is a 86 year old white female who presents with the acute onset of drooling, facial weakness and dysarthria.  It appears that the spell lasted for several hours.  She thinks that she has improved significantly.  She does not report having chest pain, shortness of breath or loss of consciousness.  She denies focal numbness or weakness involving the upper extremities.  She lives at home and tell me she is functions well.  The review of systems otherwise negative.  GENERAL: This is a very pleasant female who appears about 10 years younger than his stated age.  HEENT: Neck is supple and no trauma appreciated.  There is markedly impaired hearing bilaterally.  ABDOMEN: soft  EXTREMITIES: No edema   BACK: Normal  SKIN: Normal by inspection.    MENTAL STATUS: Alert and oriented -including orientation to her age and the month. Speech, language and cognition are generally intact. Judgment and insight normal.   CRANIAL NERVES: Pupils are equal, round and reactive to light and accomodation; extra ocular movements are full, there is no significant nystagmus; visual fields are full; upper and lower facial muscles are normal in strength and symmetric, there is no flattening of the nasolabial folds; tongue is midline; uvula is midline; shoulder elevation is normal.  MOTOR: Normal tone, bulk and strength; no pronator drift.  COORDINATION: Left finger to nose is  normal, right finger to nose is normal, No rest tremor; no intention tremor; no postural tremor; no bradykinesia.  REFLEXES: Deep tendon reflexes are symmetrical and normal.    SENSATION: Normal to light touch, temperature, and pain.   NIH stroke scale 0.   Blood pressure (!) 149/73, pulse 87, temperature 98 F (36.7 C), temperature source Oral, resp. rate 18, height 5' 5"  (1.651 m), weight 74.2 kg, SpO2 98 %.  Past Medical History:  Diagnosis Date  . Carotid stenosis, right 10/16/2019   75% on CTA neck  . Diabetes ([place])   . Hypertension   . Mild cognitive impairment   . Pneumonia     Past Surgical History:  Procedure Laterality Date  . HIP ARTHROPLASTY Right   . TONSILLECTOMY      Family History  Problem Relation Age of Onset  . Cirrhosis Mother        no etoh use  . CAD Father     Social History:  reports that she has never smoked. She has never used smokeless tobacco. She reports that she does not drink alcohol or use drugs.  Allergies: No Known Allergies  Medications: Prior to Admission medications   Medication Sig Start Date End Date Taking? Authorizing Provider  ALPRAZolam Duanne Moron) 0.5 MG tablet Take 0.5 mg by mouth at bedtime. *May take one tablet three times daily as needed for anxiety**   Yes [provider]  aspirin EC 81 MG tablet Take 81 mg by mouth daily.   Yes [provider]  glimepiride (AMARYL) 1 MG tablet Take 1 mg by mouth  daily with breakfast.   Yes [provider]  metoprolol tartrate (LOPRESSOR) 25 MG tablet Take 25 mg by mouth 2 (two) times daily.   Yes [provider]  omeprazole (PRILOSEC) 20 MG capsule Take 20 mg by mouth daily. *May take one capsule twice daily   Yes [provider]    Scheduled Meds: .  stroke: mapping our early stages of recovery book   Does not apply Once  . ALPRAZolam  0.5 mg Oral QHS  . aspirin  300 mg Rectal Daily   Or  . aspirin  325 mg Oral Daily  . clopidogrel  75  mg Oral Daily   Continuous Infusions: PRN Meds:.acetaminophen **OR** acetaminophen (TYLENOL) oral liquid 160 mg/5 mL **OR** acetaminophen     Results for orders placed or performed during the hospital encounter of 10/16/19 (from the past 48 hour(s))  CBG monitoring, ED     Status: Abnormal   Collection Time: 10/16/19  6:56 PM  Result Value Ref Range   Glucose-Capillary 115 (H) 70 - 99 mg/dL  Ethanol     Status: None   Collection Time: 10/16/19  7:02 PM  Result Value Ref Range   Alcohol, Ethyl (B) <10 <10 mg/dL    Comment: (NOTE) Lowest detectable limit for serum alcohol is 10 mg/dL. For medical purposes only. Performed at [facility], [address], [place], [place] [address]   Protime-INR     Status: None   Collection Time: 10/16/19  7:03 PM  Result Value Ref Range   Prothrombin Time 12.7 11.4 - 15.2 seconds   INR 1.0 0.8 - 1.2    Comment: (NOTE) INR goal varies based on device and disease states. Performed at [facility], [address], [place], [place] [address]   APTT     Status: None   Collection Time: 10/16/19  7:03 PM  Result Value Ref Range   aPTT 27 24 - 36 seconds    Comment: Performed at [facility], [address], [place], [place] [address]  CBC     Status: None   Collection Time: 10/16/19  7:03 PM  Result Value Ref Range   WBC 6.4 4.0 - 10.5 K/uL   RBC 4.47 3.87 - 5.11 MIL/uL   Hemoglobin 13.8 12.0 - 15.0 g/dL   HCT 42.7 36.0 - 46.0 %   MCV 95.5 80.0 - 100.0 fL   MCH 30.9 26.0 - 34.0 pg   MCHC 32.3 30.0 - 36.0 g/dL   RDW 13.1 11.5 - 15.5 %   Platelets 295 150 - 400 K/uL   nRBC 0.0 0.0 - 0.2 %    Comment: Performed at [facility], [address] [place], [place] [address]  Differential     Status: None   Collection Time: 10/16/19  7:03 PM  Result Value Ref Range   Neutrophils Relative % 43 %   Neutro Abs 2.8 1.7 - 7.7 K/uL   Lymphocytes Relative 45 %   Lymphs Abs 2.8 0.7 - 4.0 K/uL   Monocytes Relative 10 %   Monocytes Absolute 0.6 0.1 - 1.0  K/uL   Eosinophils Relative 2 %   Eosinophils Absolute 0.1 0.0 - 0.5 K/uL   Basophils Relative 0 %   Basophils Absolute 0.0 0.0 - 0.1 K/uL   Immature Granulocytes 0 %   Abs Immature Granulocytes 0.01 0.00 - 0.07 K/uL    Comment: Performed at [facility], [address] [place], [place] [address]  Comprehensive metabolic panel  Status: Abnormal   Collection Time: 10/16/19  7:03 PM  Result Value Ref Range   Sodium 139 135 - 145 mmol/L   Potassium 4.2 3.5 - 5.1 mmol/L   Chloride 103 98 - 111 mmol/L   CO2 28 22 - 32 mmol/L   Glucose, Bld 122 (H) 70 - 99 mg/dL   BUN 17 8 - 23 mg/dL   Creatinine, Ser 1.12 (H) 0.44 - 1.00 mg/dL   Calcium 9.4 8.9 - 10.3 mg/dL   Total Protein 7.4 6.5 - 8.1 g/dL   Albumin 4.1 3.5 - 5.0 g/dL   AST 24 15 - 41 U/L   ALT 20 0 - 44 U/L   Alkaline Phosphatase 66 38 - 126 U/L   Total Bilirubin 0.8 0.3 - 1.2 mg/dL   GFR calc non Af Amer 42 (L) >60 mL/min   GFR calc Af Amer 49 (L) >60 mL/min   Anion gap 8 5 - 15    Comment: Performed at [facility], [address], [place], [place] [address]  Respiratory Panel by RT PCR (Flu A&B, Covid) - Nasopharyngeal Swab     Status: None   Collection Time: 10/16/19  7:06 PM   Specimen: Nasopharyngeal Swab  Result Value Ref Range   SARS Coronavirus 2 by RT PCR NEGATIVE NEGATIVE    Comment: (NOTE) SARS-CoV-2 target nucleic acids are NOT DETECTED. The SARS-CoV-2 RNA is generally detectable in upper respiratoy specimens during the acute phase of infection. The lowest concentration of SARS-CoV-2 viral copies this assay can detect is 131 copies/mL. A negative result does not preclude SARS-Cov-2 infection and should not be used as the sole basis for treatment or other patient management decisions. A negative result may occur with  improper specimen collection/handling, submission of specimen other than nasopharyngeal swab, presence of viral mutation(s) within the areas targeted by this assay, and inadequate number of viral  copies (<131 copies/mL). A negative result must be combined with clinical observations, patient history, and epidemiological information. The expected result is Negative. Fact Sheet for Patients:  PinkCheek.be Fact Sheet for Healthcare Providers:  GravelBags.it This test is not yet ap proved or cleared by the [place] FDA and  has been authorized for detection and/or diagnosis of SARS-CoV-2 by FDA under an Emergency Use Authorization (EUA). This EUA will remain  in effect (meaning this test can be used) for the duration of the COVID-19 declaration under Section 564(b)(1) of the Act, 21 U.S.C. section 360bbb-3(b)(1), unless the authorization is terminated or revoked sooner.    Influenza A by PCR NEGATIVE NEGATIVE   Influenza B by PCR NEGATIVE NEGATIVE    Comment: (NOTE) The Xpert Xpress SARS-CoV-2/FLU/RSV assay is intended as an aid in  the diagnosis of influenza from Nasopharyngeal swab specimens and  should not be used as a sole basis for treatment. Nasal washings and  aspirates are unacceptable for Xpert Xpress SARS-CoV-2/FLU/RSV  testing. Fact Sheet for Patients: PinkCheek.be Fact Sheet for Healthcare Providers: GravelBags.it This test is not yet approved or cleared by the [place] FDA and  has been authorized for detection and/or diagnosis of SARS-CoV-2 by  FDA under an Emergency Use Authorization (EUA). This EUA will remain  in effect (meaning this test can be used) for the duration of the  Covid-19 declaration under Section 564(b)(1) of the Act, 21  U.S.C. section 360bbb-3(b)(1), unless the authorization is  terminated or revoked. Performed at [facility], [address] [place], [place] [address]   Urine rapid drug screen (hosp performed)  Status: Abnormal   Collection Time: 10/16/19  8:37 PM  Result Value Ref Range   Opiates NONE DETECTED NONE  DETECTED   Cocaine NONE DETECTED NONE DETECTED   Benzodiazepines POSITIVE (A) NONE DETECTED   Amphetamines NONE DETECTED NONE DETECTED   Tetrahydrocannabinol NONE DETECTED NONE DETECTED   Barbiturates NONE DETECTED NONE DETECTED    Comment: (NOTE) DRUG SCREEN FOR MEDICAL PURPOSES ONLY.  IF CONFIRMATION IS NEEDED FOR ANY PURPOSE, NOTIFY LAB WITHIN 5 DAYS. LOWEST DETECTABLE LIMITS FOR URINE DRUG SCREEN Drug Class                     Cutoff (ng/mL) Amphetamine and metabolites    1000 Barbiturate and metabolites    200 Benzodiazepine                 211 Tricyclics and metabolites     300 Opiates and metabolites        300 Cocaine and metabolites        300 THC                            50 Performed at [facility], [address], [place], [place] [address]   Urinalysis, Routine w reflex microscopic     Status: Abnormal   Collection Time: 10/16/19  8:37 PM  Result Value Ref Range   Color, Urine COLORLESS (A) YELLOW   APPearance CLEAR CLEAR   Specific Gravity, Urine 1.015 1.005 - 1.030   pH 6.0 5.0 - 8.0   Glucose, UA NEGATIVE NEGATIVE mg/dL   Hgb urine dipstick NEGATIVE NEGATIVE   Bilirubin Urine NEGATIVE NEGATIVE   Ketones, ur NEGATIVE NEGATIVE mg/dL   Protein, ur NEGATIVE NEGATIVE mg/dL   Nitrite NEGATIVE NEGATIVE   Leukocytes,Ua NEGATIVE NEGATIVE    Comment: Performed at [facility], [address], [place], [place] [address]  Hemoglobin A1c     Status: Abnormal   Collection Time: 10/17/19  5:01 AM  Result Value Ref Range   Hgb A1c MFr Bld 6.1 (H) 4.8 - 5.6 %    Comment: (NOTE) Pre diabetes:          5.7%-6.4% Diabetes:              >6.4% Glycemic control for   <7.0% adults with diabetes    Mean Plasma Glucose 128.37 mg/dL    Comment: Performed at [facility] [address] [place],  [address]  Lipid panel     Status: Abnormal   Collection Time: 10/17/19  5:01 AM  Result Value Ref Range   Cholesterol 154 0 - 200 mg/dL   Triglycerides 120 <150 mg/dL    HDL 32 (L) >40 mg/dL   Total CHOL/HDL Ratio 4.8 RATIO   VLDL 24 0 - 40 mg/dL   LDL Cholesterol 98 0 - 99 mg/dL    Comment:        Total Cholesterol/HDL:CHD Risk Coronary Heart Disease Risk Table                     Men   Women  1/2 Average Risk   3.4   3.3  Average Risk       5.0   4.4  2 X Average Risk   9.6   7.1  3 X Average Risk  23.4   11.0        Use the calculated Patient Ratio above and the CHD Risk  Table to determine the patient's CHD Risk.        ATP III CLASSIFICATION (LDL):  <100     mg/dL   Optimal  100-129  mg/dL   Near or Above                    Optimal  130-159  mg/dL   Borderline  160-189  mg/dL   High  >190     mg/dL   Very High Performed at [facility], [address] [place], [place] [address]   CBC     Status: Abnormal   Collection Time: 10/17/19  5:01 AM  Result Value Ref Range   WBC 6.2 4.0 - 10.5 K/uL   RBC 3.81 (L) 3.87 - 5.11 MIL/uL   Hemoglobin 12.0 12.0 - 15.0 g/dL   HCT 36.4 36.0 - 46.0 %   MCV 95.5 80.0 - 100.0 fL   MCH 31.5 26.0 - 34.0 pg   MCHC 33.0 30.0 - 36.0 g/dL   RDW 13.2 11.5 - 15.5 %   Platelets 249 150 - 400 K/uL   nRBC 0.0 0.0 - 0.2 %    Comment: Performed at [facility], [address], [place], [place] [address]  Comprehensive metabolic panel     Status: Abnormal   Collection Time: 10/17/19  5:01 AM  Result Value Ref Range   Sodium 140 135 - 145 mmol/L   Potassium 3.7 3.5 - 5.1 mmol/L   Chloride 105 98 - 111 mmol/L   CO2 25 22 - 32 mmol/L   Glucose, Bld 108 (H) 70 - 99 mg/dL   BUN 17 8 - 23 mg/dL   Creatinine, Ser 1.03 (H) 0.44 - 1.00 mg/dL   Calcium 8.8 (L) 8.9 - 10.3 mg/dL   Total Protein 5.9 (L) 6.5 - 8.1 g/dL   Albumin 3.4 (L) 3.5 - 5.0 g/dL   AST 19 15 - 41 U/L   ALT 17 0 - 44 U/L   Alkaline Phosphatase 52 38 - 126 U/L   Total Bilirubin 0.6 0.3 - 1.2 mg/dL   GFR calc non Af Amer 46 (L) >60 mL/min   GFR calc Af Amer 54 (L) >60 mL/min   Anion gap 10 5 - 15    Comment: Performed at [facility], [address] [place], [place] [address]    Studies/Results:  BRAIN MRI FINDINGS: Brain: Patchy restricted diffusion of the right frontal operculum and subcortical white matter tracking toward the right motor strip (series 12, image 37, and series 5, image 15). Faint T2 and FLAIR hyperintensity. No evidence of hemorrhage. No mass effect.  No other restricted diffusion. Bilateral cerebral white matter confluent T2 and FLAIR hyperintensity with deep white matter capsule involvement. Small chronic infarcts in the cerebellum mostly the left (series 7, image 3).  Comparatively mild T2 heterogeneity in the bilateral deep gray nuclei and brainstem. No chronic cortical encephalomalacia identified. No chronic cerebral blood products identified.  No midline shift, mass effect, evidence of mass lesion, ventriculomegaly, extra-axial collection or acute intracranial hemorrhage. Cervicomedullary junction and pituitary are within normal limits.  Vascular: Major intracranial vascular flow voids are preserved.  Skull and upper cervical spine: Negative for age visible cervical spine. Visualized bone marrow signal is within normal limits.  Sinuses/Orbits: Postoperative changes to both globes, otherwise negative orbits. Paranasal Visualized paranasal sinuses and mastoids are stable and well pneumatized.  Other: Scalp and face soft tissues appear negative.  IMPRESSION: 1. Patchy acute infarct in the Right MCA  territory: right frontal operculum and middle frontal gyrus. No associated hemorrhage or mass effect. 2. Underlying chronic ischemic disease mostly affecting the cerebral white matter and the cerebellum.     HEAD NECK CTA CTA NECK FINDINGS  Aortic arch: Visualized aortic arch of normal caliber with normal 3 vessel morphology. Moderate atherosclerotic change about the visualized arch and origin of the great vessels without hemodynamically significant stenosis. Visualized  subclavian arteries widely patent.  Right carotid system: Right common carotid artery patent from its origin to the bifurcation without stenosis. Bulky calcified plaque about the right bifurcation/proximal right ICA with associated stenosis of up to 75% by NASCET criteria. Right ICA otherwise widely patent distally to the skull base without stenosis, dissection or occlusion.  Left carotid system: Calcified plaque at the origin of the left CCA without significant stenosis. Left CCA widely patent distally to the bifurcation. Scattered mixed plaque about the left bifurcation without hemodynamically significant stenosis. Left ICA widely patent distally to the skull base without stenosis, dissection or occlusion.  Vertebral arteries: Both vertebral arteries arise from the subclavian arteries. Vertebral arteries widely patent within the neck without stenosis, dissection or occlusion.  Skeleton: No acute osseous abnormality. No discrete lytic or blastic osseous lesions. Prior ACDF at C4-5 noted. Patient is edentulous. Mild degenerative changes noted about the TMJs bilaterally.  Other neck: No other acute soft tissue abnormality within the neck. No adenopathy. Normal thyroid. Soft tissue lesions seen within both parotid glands measuring up to 9 mm on the left and 8 mm on the right noted, indeterminate, but felt to be of doubtful significance given patient age.  Upper chest: Visualized upper chest demonstrates no acute finding. Irregular biapical pleuroparenchymal thickening noted about the lung apices. Visualized lungs are otherwise clear.  Review of the MIP images confirms the above findings  CTA HEAD FINDINGS  Anterior circulation: Right ICA widely patent through the siphon without stenosis. Scattered atheromatous plaque within the cavernous and supraclinoid left ICA with no more than mild multifocal stenosis. A1 segments widely patent. Normal anterior  communicating artery. Anterior cerebral arteries widely patent to their distal aspects without stenosis. No M1 stenosis or occlusion. Normal MCA bifurcations. Distal MCA branches well perfused and symmetric.  Posterior circulation: Vertebral arteries widely patent to the vertebrobasilar junction without stenosis. Left vertebral dominant. Patent left PICA. Right PICA not seen. Basilar widely patent to its distal aspect without stenosis. Superior cerebral arteries patent bilaterally. Left PCA primarily supplied via the basilar. Fetal type origin of the right PCA. Short-segment severe stenosis at the left P1/P2 junction (series 11, image 20). PCAs otherwise widely patent to their distal aspects.  Venous sinuses: Patent.  Anatomic variants: Fetal type origin of the right PCA. No intracranial aneurysm.  Review of the MIP images confirms the above findings  IMPRESSION: CT HEAD IMPRESSION:  1. No acute intracranial abnormality identified. 2. Aspects equals 10/10. 3. Age-related cerebral atrophy with moderate chronic microvascular ischemic disease, with small remote left cerebellar infarct.  CTA HEAD AND NECK IMPRESSION:  1. Negative CTA for large vessel occlusion. 2. Bulky calcified plaque about the right carotid bifurcation with associated stenosis of up to 75% by NASCET criteria. 3. Short-segment severe stenosis at the left P1/P2 junction. 4. Mild for age atheromatous change elsewhere about the major arterial vasculature of the head and neck. No other high-grade or correctable stenosis.          TTE 1. Left ventricular ejection fraction, by visual estimation, is 60 to  65%. The left ventricle  has normal function. There is mildly increased  left ventricular hypertrophy.  2. Elevated left ventricular end-diastolic pressure.  3. Left ventricular diastolic parameters are consistent with Grade I  diastolic dysfunction (impaired relaxation).  4. The left  ventricle has no regional wall motion abnormalities.  5. Global right ventricle has normal systolic function.The right  ventricular size is normal. No increase in right ventricular wall  thickness.  6. Left atrial size was normal.  7. Right atrial size was normal.  8. Moderate mitral annular calcification.  9. The mitral valve is degenerative. Trivial mitral valve regurgitation.  Mild mitral stenosis.  10. The tricuspid valve is grossly normal.  11. The tricuspid valve is grossly normal. Tricuspid valve regurgitation  is mild.  12. The aortic valve is tricuspid. Aortic valve regurgitation is not  visualized. Moderate to severe aortic valve stenosis.  13. The pulmonic valve was grossly normal. Pulmonic valve regurgitation is  not visualized.  14. Moderately elevated pulmonary artery systolic pressure.  15. The inferior vena cava is normal in size with greater than 50%  respiratory variability, suggesting right atrial pressure of 3 mmHg.       The brain MRI scan is reviewed in person. There is faint increased signal on DWI involving the posterior right  Frontal lobe indicating a subacute infarct. This is a cortical based infarct. There is subtle changes in the ADC scan. There is appropriate marked atrophy for age. There is also marked periventricular and deep white matter leukoencephalopathy. There is a remote left inferior cerebellar infarct. No hemorrhages appreciated.    Omaira Mellen A. Merlene Laughter, M.D.  Diplomate, Tax adviser of Psychiatry and Neurology ( Neurology). 10/17/2019, 2:53 PM

## 2019-10-17 NOTE — Evaluation (Signed)
Physical Therapy Evaluation Patient Details Name: Denise Parrish MRN: [id number] DOB: Oct 20, 1922 Today's Date: 10/17/2019   History of Present Illness  Denise Parrish  is a 86 y.o. female,  w hypertension , Dm2, R carotid stenosis, h/o mild cognitive impairment, presents with c/o slurred speech and left facial droop starting around 1845 today. Pt is currently back to baseline. Pt denies headache, vision change, cp, palp, sob, n/v, diarrhea, brbpr, dysuria, focal weakness, numbness, tingling.     Clinical Impression  Patient functioning at baseline for functional mobility and gait.  Plan:  Patient discharged from physical therapy to care of nursing for ambulation daily as tolerated for length of stay.     Follow Up Recommendations No PT follow up    Equipment Recommendations  None recommended by PT    Recommendations for Other Services       Precautions / Restrictions Precautions Precautions: None Restrictions Weight Bearing Restrictions: No      Mobility  Bed Mobility Overal bed mobility: Modified Independent Bed Mobility: Supine to Sit     Supine to sit: Supervision     General bed mobility comments: increased time  Transfers Overall transfer level: Modified independent Equipment used: None Transfers: Sit to/from [facility] Sit to Stand: Min guard Stand pivot transfers: Supervision       General transfer comment: slightly increased time, slightly labored movement  Ambulation/Gait Ambulation/Gait assistance: Modified independent (Device/Increase time) Gait Distance (Feet): 80 Feet Assistive device: None Gait Pattern/deviations: WFL(Within Functional Limits) Gait velocity: slightly decreased   General Gait Details: demonstrates good return for ambulation in room and hallway without loss of balance  Stairs            Wheelchair Mobility    Modified Rankin (Stroke Patients Only)       Balance Overall balance assessment: No  apparent balance deficits (not formally assessed)                                           Pertinent Vitals/Pain Pain Assessment: No/denies pain    Home Living Family/patient expects to be discharged to:: Private residence Living Arrangements: Alone Available Help at Discharge: Family;Available PRN/intermittently Type of Home: House Home Access: Stairs to enter Entrance Stairs-Rails: Right;Left;Can reach both Entrance Stairs-Number of Steps: 4-5 Home Layout: One level Home Equipment: Other (comment);Walker - 2 wheels;Cane - quad;Wheelchair - manual;Bedside commode(walking stick) Additional Comments: Pt sponge bathes at baseline    Prior Function Level of Independence: Independent with assistive device(s)         Comments: household ambulator without AD, uses walking stick for outside and short community distances     Hand Dominance   Dominant Hand: Right    Extremity/Trunk Assessment   Upper Extremity Assessment Upper Extremity Assessment: Defer to OT evaluation    Lower Extremity Assessment Lower Extremity Assessment: Overall WFL for tasks assessed    Cervical / Trunk Assessment Cervical / Trunk Assessment: Normal  Communication   Communication: HOH  Cognition Arousal/Alertness: Awake/alert Behavior During Therapy: WFL for tasks assessed/performed Overall Cognitive Status: Within Functional Limits for tasks assessed                                        General Comments      Exercises     Assessment/Plan  PT Assessment Patent does not need any further PT services  PT Problem List         PT Treatment Interventions      PT Goals (Current goals can be found in the Care Plan section)  Acute Rehab PT Goals Patient Stated Goal: return home with family to assist PT Goal Formulation: With patient Time For Goal Achievement: 10/17/19 Potential to Achieve Goals: Good    Frequency     Barriers to discharge         Co-evaluation               AM-PAC PT "6 Clicks" Mobility  Outcome Measure Help needed turning from your back to your side while in a flat bed without using bedrails?: None Help needed moving from lying on your back to sitting on the side of a flat bed without using bedrails?: None Help needed moving to and from a bed to a chair (including a wheelchair)?: None Help needed standing up from a chair using your arms (e.g., wheelchair or bedside chair)?: None Help needed to walk in hospital room?: None Help needed climbing 3-5 steps with a railing? : None 6 Click Score: 24    End of Session   Activity Tolerance: Patient tolerated treatment well;Patient limited by fatigue Patient left: in chair;with call bell/phone within reach Nurse Communication: Mobility status PT Visit Diagnosis: Unsteadiness on feet (R26.81);Other abnormalities of gait and mobility (R26.89);Muscle weakness (generalized) (M62.81)    Time: 4417-1278 PT Time Calculation (min) (ACUTE ONLY): 20 min   Charges:   PT Evaluation $PT Eval Moderate Complexity: 1 Mod PT Treatments $Therapeutic Activity: 8-22 mins        10:53 AM, 10/17/19 [name], MPT Physical Therapist with [facility] 336 [phone] office [phone] mobile phone

## 2019-10-18 DIAGNOSIS — I10 Essential (primary) hypertension: Secondary | ICD-10-CM

## 2019-10-18 DIAGNOSIS — E118 Type 2 diabetes mellitus with unspecified complications: Secondary | ICD-10-CM

## 2019-10-18 DIAGNOSIS — I6521 Occlusion and stenosis of right carotid artery: Secondary | ICD-10-CM

## 2019-10-18 LAB — GLUCOSE, CAPILLARY: Glucose-Capillary: 110 mg/dL — ABNORMAL HIGH (ref 70–99)

## 2019-10-18 MED ORDER — INSULIN ASPART 100 UNIT/ML ~~LOC~~ SOLN
0.0000 [IU] | Freq: Three times a day (TID) | SUBCUTANEOUS | Status: DC
  Administered 2019-10-20 – 2019-10-22 (×3): 1 [IU] via SUBCUTANEOUS

## 2019-10-18 MED ORDER — INSULIN ASPART 100 UNIT/ML ~~LOC~~ SOLN
0.0000 [IU] | Freq: Every day | SUBCUTANEOUS | Status: DC
  Administered 2019-10-21: 3 [IU] via SUBCUTANEOUS

## 2019-10-18 MED ORDER — SENNOSIDES-DOCUSATE SODIUM 8.6-50 MG PO TABS: 1.0000 | ORAL_TABLET | Freq: Every evening | ORAL | Status: DC | PRN

## 2019-10-18 NOTE — Plan of Care (Signed)
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  Appreciate Dr. Claretha Cooper input.  Plan to have right carotid endarterectomy early next week.  Continue aspirin and Plavix.

## 2019-10-18 NOTE — Progress Notes (Signed)
PROGRESS NOTE    Denise Parrish  XHB:[id number] DOB: 1922-11-25 DOA: 10/16/2019 PCP: [name], MD   Brief Narrative:  Per HPI: MarylouWilliamsis a86 y.o.female,w hypertension , Dm2, R carotid stenosis, h/o mild cognitive impairment, presents with c/o slurred speech and left facial droop starting around 1845 today. Pt is currently back to baseline. Pt denies headache, vision change, cp, palp, sob, n/v, diarrhea, brbpr, dysuria, focal weakness, numbness, tingling.   At baseline, pt drives, and is able to perform all her ADLS.  FULL CODE per daughter  1/29: Patient was admitted with right MCA distribution CVA with noted slurred speech and left-sided facial droop.  Her symptoms are resolved this morning.  She is noted to have 75% right carotid occlusion and will likely require vascular surgery follow-up in outpatient setting.  Neurology consultation pending.  2D echocardiogram performed with results pending.  Neurology recommends transfer to [facility] for consideration of urgent carotid endarterectomy. Discussed with vascular surgery Dr. Donzetta Matters who agrees and will see in consultation.  1/30: Patient will be transferred to [facility] soon.  No acute overnight events noted.  Assessment & Plan:   Principal Problem:   Stroke [facility]) Active Problems:   TIA (transient ischemic attack)   Slurred speech   Facial droop   Essential hypertension   Diabetes ([place])   CVA (cerebral vascular accident) ([place])   Slurred speech, L facial droop secondary to patchy right MCA distribution CVA 2D echocardiogram with LVEF 60-65% with grade 1 diastolic dysfunction noted Hemoglobin A1c 6.1% and LDL 98 PT/OT/speech evaluation with no further recommendations on discharge Cont aspirin Cont Plavix at 36m po qday (loaded w 3047mpo x1 10/16/19) Lipitor 8058mo qhs Neurology consult appreciated with recommendations for transfer, please consult neurology for further evaluation and care at [facility]  needed.  R carotid stenosis 75% Cont aspirin, plavix as above Continue statin [facility]  Hypertension-stable control Holding metoprolol for now  Anxiety Cont Xanax 0.5mg25m qhs  Dm2 Hold Amaryl Fsbs q4h, ISS  Gerd Cont PPI   DVT prophylaxis: SCDs Code Status: Full Family Communication: Informed daughter on phone 1/29 Disposition Plan: ApprGuanicarology evaluation with [facility]   Consultants:   Neurology  Vascular surgery  Procedures:   As noted below  Antimicrobials:   None  Subjective: Patient seen and evaluated today with no new acute complaints or concerns. No acute concerns or events noted overnight.  Objective: Vitals:   10/17/19 2224 10/18/19 0241 10/18/19 0621 10/18/19 0852  BP: 124/61 109/61 123/67 131/84  Pulse: 81 77 78 65  Resp: 18 16 18 18   Temp: 98.3 F (36.8 C) 98.1 F (36.7 C) 97.6 F (36.4 C) 97.6 F (36.4 C)  TempSrc:    Oral  SpO2: 99% 96% 100% 100%  Weight:      Height:        Intake/Output Summary (Last 24 hours) at 10/18/2019 0934 Last data filed at 10/18/2019 0100 Gross per 24 hour  Intake 720 ml  Output --  Net 720 ml   Filed Weights   10/16/19 1944 10/16/19 2225  Weight: 75.6 kg 74.2 kg    Examination:  General exam: Appears calm and comfortable, hard of hearing Respiratory system: Clear to auscultation. Respiratory effort normal. Cardiovascular system: S1 & S2 heard, RRR. No JVD, murmurs, rubs, gallops or clicks. No pedal edema. Gastrointestinal system: Abdomen is nondistended, soft and nontender. No organomegaly or masses felt. Normal bowel  sounds heard. Central nervous system: Alert and oriented. No focal neurological deficits. Extremities: Symmetric 5 x 5 power. Skin: No rashes, lesions or ulcers Psychiatry: Judgement and insight appear normal. Mood & affect appropriate.     Data Reviewed: I have personally  reviewed following labs and imaging studies  CBC: Recent Labs  Lab 10/16/19 1903 10/17/19 0501  WBC 6.4 6.2  NEUTROABS 2.8  --   HGB 13.8 12.0  HCT 42.7 36.4  MCV 95.5 95.5  PLT 295 627   Basic Metabolic Panel: Recent Labs  Lab 10/16/19 1903 10/17/19 0501  NA 139 140  K 4.2 3.7  CL 103 105  CO2 28 25  GLUCOSE 122* 108*  BUN 17 17  CREATININE 1.12* 1.03*  CALCIUM 9.4 8.8*   GFR: Estimated Creatinine Clearance: 33.7 mL/min (A) (by C-G formula based on SCr of 1.03 mg/dL (H)). Liver Function Tests: Recent Labs  Lab 10/16/19 1903 10/17/19 0501  AST 24 19  ALT 20 17  ALKPHOS 66 52  BILITOT 0.8 0.6  PROT 7.4 5.9*  ALBUMIN 4.1 3.4*   No results for input(s): LIPASE, AMYLASE in the last 168 hours. No results for input(s): AMMONIA in the last 168 hours. Coagulation Profile: Recent Labs  Lab 10/16/19 1903  INR 1.0   Cardiac Enzymes: No results for input(s): CKTOTAL, CKMB, CKMBINDEX, TROPONINI in the last 168 hours. BNP (last 3 results) No results for input(s): PROBNP in the last 8760 hours. HbA1C: Recent Labs    10/17/19 0501  HGBA1C 6.1*   CBG: Recent Labs  Lab 10/16/19 1856  GLUCAP 115*   Lipid Profile: Recent Labs    10/17/19 0501  CHOL 154  HDL 32*  LDLCALC 98  TRIG 120  CHOLHDL 4.8   Thyroid Function Tests: No results for input(s): TSH, T4TOTAL, FREET4, T3FREE, THYROIDAB in the last 72 hours. Anemia Panel: No results for input(s): VITAMINB12, FOLATE, FERRITIN, TIBC, IRON, RETICCTPCT in the last 72 hours. Sepsis Labs: No results for input(s): PROCALCITON, LATICACIDVEN in the last 168 hours.  Recent Results (from the past 240 hour(s))  Respiratory Panel by RT PCR (Flu A&B, Covid) - Nasopharyngeal Swab     Status: None   Collection Time: 10/16/19  7:06 PM   Specimen: Nasopharyngeal Swab  Result Value Ref Range Status   SARS Coronavirus 2 by RT PCR NEGATIVE NEGATIVE Final    Comment: (NOTE) SARS-CoV-2 target nucleic acids are NOT  DETECTED. The SARS-CoV-2 RNA is generally detectable in upper respiratoy specimens during the acute phase of infection. The lowest concentration of SARS-CoV-2 viral copies this assay can detect is 131 copies/mL. A negative result does not preclude SARS-Cov-2 infection and should not be used as the sole basis for treatment or other patient management decisions. A negative result may occur with  improper specimen collection/handling, submission of specimen other than nasopharyngeal swab, presence of viral mutation(s) within the areas targeted by this assay, and inadequate number of viral copies (<131 copies/mL). A negative result must be combined with clinical observations, patient history, and epidemiological information. The expected result is Negative. Fact Sheet for Patients:  PinkCheek.be Fact Sheet for Healthcare Providers:  GravelBags.it This test is not yet ap proved or cleared by the [place] FDA and  has been authorized for detection and/or diagnosis of SARS-CoV-2 by FDA under an Emergency Use Authorization (EUA). This EUA will remain  in effect (meaning this test can be used) for the duration of the COVID-19 declaration under Section 564(b)(1) of the Act, 21 U.S.C.  section 360bbb-3(b)(1), unless the authorization is terminated or revoked sooner.    Influenza A by PCR NEGATIVE NEGATIVE Final   Influenza B by PCR NEGATIVE NEGATIVE Final    Comment: (NOTE) The Xpert Xpress SARS-CoV-2/FLU/RSV assay is intended as an aid in  the diagnosis of influenza from Nasopharyngeal swab specimens and  should not be used as a sole basis for treatment. Nasal washings and  aspirates are unacceptable for Xpert Xpress SARS-CoV-2/FLU/RSV  testing. Fact Sheet for Patients: PinkCheek.be Fact Sheet for Healthcare Providers: GravelBags.it This test is not yet approved or cleared  by the [place] FDA and  has been authorized for detection and/or diagnosis of SARS-CoV-2 by  FDA under an Emergency Use Authorization (EUA). This EUA will remain  in effect (meaning this test can be used) for the duration of the  Covid-19 declaration under Section 564(b)(1) of the Act, 21  U.S.C. section 360bbb-3(b)(1), unless the authorization is  terminated or revoked. Performed at [facility], [address], [place], [place] [address]          Radiology Studies: CT Angio Head W or Wo Contrast  Result Date: 10/16/2019 CLINICAL DATA:  Initial evaluation for acute left facial weakness. EXAM: CT ANGIOGRAPHY HEAD AND NECK TECHNIQUE: Multidetector CT imaging of the head and neck was performed using the standard protocol during bolus administration of intravenous contrast. Multiplanar CT image reconstructions and MIPs were obtained to evaluate the vascular anatomy. Carotid stenosis measurements (when applicable) are obtained utilizing NASCET criteria, using the distal internal carotid diameter as the denominator. CONTRAST:  100 cc of Omnipaque 350. COMPARISON:  None available. FINDINGS: CT HEAD FINDINGS Brain: Generalized age-related cerebral atrophy. Patchy and confluent hypodensity within the periventricular deep white matter both cerebral hemispheres most consistent with chronic small vessel ischemic disease, moderate in nature. Small remote left cerebellar infarct noted. No acute intracranial hemorrhage. No acute large vessel territory infarct. No mass lesion, midline shift or mass effect. No hydrocephalus. No extra-axial fluid collection. Vascular: No hyperdense vessel. Calcified atherosclerosis present at the skull base. Skull: Scalp soft tissues within normal limits.  Calvarium intact. Sinuses/Orbits: Globes and orbital soft tissues demonstrate no acute finding. Right gaze noted. Paranasal sinuses and mastoid air cells are clear. Other: None. ASPECTS ([place] Stroke Program Early CT  Score) - Ganglionic level infarction (caudate, lentiform nuclei, internal capsule, insula, M1-M3 cortex): 7 - Supraganglionic infarction (M4-M6 cortex): 3 Total score (0-10 with 10 being normal): 10 CTA NECK FINDINGS Aortic arch: Visualized aortic arch of normal caliber with normal 3 vessel morphology. Moderate atherosclerotic change about the visualized arch and origin of the great vessels without hemodynamically significant stenosis. Visualized subclavian arteries widely patent. Right carotid system: Right common carotid artery patent from its origin to the bifurcation without stenosis. Bulky calcified plaque about the right bifurcation/proximal right ICA with associated stenosis of up to 75% by NASCET criteria. Right ICA otherwise widely patent distally to the skull base without stenosis, dissection or occlusion. Left carotid system: Calcified plaque at the origin of the left CCA without significant stenosis. Left CCA widely patent distally to the bifurcation. Scattered mixed plaque about the left bifurcation without hemodynamically significant stenosis. Left ICA widely patent distally to the skull base without stenosis, dissection or occlusion. Vertebral arteries: Both vertebral arteries arise from the subclavian arteries. Vertebral arteries widely patent within the neck without stenosis, dissection or occlusion. Skeleton: No acute osseous abnormality. No discrete lytic or blastic osseous lesions. Prior ACDF at C4-5 noted. Patient is edentulous. Mild degenerative changes  noted about the TMJs bilaterally. Other neck: No other acute soft tissue abnormality within the neck. No adenopathy. Normal thyroid. Soft tissue lesions seen within both parotid glands measuring up to 9 mm on the left and 8 mm on the right noted, indeterminate, but felt to be of doubtful significance given patient age. Upper chest: Visualized upper chest demonstrates no acute finding. Irregular biapical pleuroparenchymal thickening noted about  the lung apices. Visualized lungs are otherwise clear. Review of the MIP images confirms the above findings CTA HEAD FINDINGS Anterior circulation: Right ICA widely patent through the siphon without stenosis. Scattered atheromatous plaque within the cavernous and supraclinoid left ICA with no more than mild multifocal stenosis. A1 segments widely patent. Normal anterior communicating artery. Anterior cerebral arteries widely patent to their distal aspects without stenosis. No M1 stenosis or occlusion. Normal MCA bifurcations. Distal MCA branches well perfused and symmetric. Posterior circulation: Vertebral arteries widely patent to the vertebrobasilar junction without stenosis. Left vertebral dominant. Patent left PICA. Right PICA not seen. Basilar widely patent to its distal aspect without stenosis. Superior cerebral arteries patent bilaterally. Left PCA primarily supplied via the basilar. Fetal type origin of the right PCA. Short-segment severe stenosis at the left P1/P2 junction (series 11, image 20). PCAs otherwise widely patent to their distal aspects. Venous sinuses: Patent. Anatomic variants: Fetal type origin of the right PCA. No intracranial aneurysm. Review of the MIP images confirms the above findings IMPRESSION: CT HEAD IMPRESSION: 1. No acute intracranial abnormality identified. 2. Aspects equals 10/10. 3. Age-related cerebral atrophy with moderate chronic microvascular ischemic disease, with small remote left cerebellar infarct. CTA HEAD AND NECK IMPRESSION: 1. Negative CTA for large vessel occlusion. 2. Bulky calcified plaque about the right carotid bifurcation with associated stenosis of up to 75% by NASCET criteria. 3. Short-segment severe stenosis at the left P1/P2 junction. 4. Mild for age atheromatous change elsewhere about the major arterial vasculature of the head and neck. No other high-grade or correctable stenosis. Critical Value/emergent results were called by telephone at the time of  interpretation on 10/16/2019 at 7:27 pm to provider [facility] , who verbally acknowledged these results. Electronically Signed   By: Jeannine Boga M.D.   On: 10/16/2019 20:07   DG Chest 2 View  Result Date: 10/16/2019 CLINICAL DATA:  TIA EXAM: CHEST - 2 VIEW COMPARISON:  10/05/2019 FINDINGS: Heart and mediastinal contours are within normal limits. No focal opacities or effusions. No acute bony abnormality. IMPRESSION: No active cardiopulmonary disease. Electronically Signed   By: Rolm Baptise M.D.   On: 10/16/2019 21:25   CT Angio Neck W and/or Wo Contrast  Result Date: 10/16/2019 CLINICAL DATA:  Initial evaluation for acute left facial weakness. EXAM: CT ANGIOGRAPHY HEAD AND NECK TECHNIQUE: Multidetector CT imaging of the head and neck was performed using the standard protocol during bolus administration of intravenous contrast. Multiplanar CT image reconstructions and MIPs were obtained to evaluate the vascular anatomy. Carotid stenosis measurements (when applicable) are obtained utilizing NASCET criteria, using the distal internal carotid diameter as the denominator. CONTRAST:  100 cc of Omnipaque 350. COMPARISON:  None available. FINDINGS: CT HEAD FINDINGS Brain: Generalized age-related cerebral atrophy. Patchy and confluent hypodensity within the periventricular deep white matter both cerebral hemispheres most consistent with chronic small vessel ischemic disease, moderate in nature. Small remote left cerebellar infarct noted. No acute intracranial hemorrhage. No acute large vessel territory infarct. No mass lesion, midline shift or mass effect. No hydrocephalus. No extra-axial fluid collection. Vascular: No hyperdense vessel.  Calcified atherosclerosis present at the skull base. Skull: Scalp soft tissues within normal limits.  Calvarium intact. Sinuses/Orbits: Globes and orbital soft tissues demonstrate no acute finding. Right gaze noted. Paranasal sinuses and mastoid air cells are clear.  Other: None. ASPECTS ([place] Stroke Program Early CT Score) - Ganglionic level infarction (caudate, lentiform nuclei, internal capsule, insula, M1-M3 cortex): 7 - Supraganglionic infarction (M4-M6 cortex): 3 Total score (0-10 with 10 being normal): 10 CTA NECK FINDINGS Aortic arch: Visualized aortic arch of normal caliber with normal 3 vessel morphology. Moderate atherosclerotic change about the visualized arch and origin of the great vessels without hemodynamically significant stenosis. Visualized subclavian arteries widely patent. Right carotid system: Right common carotid artery patent from its origin to the bifurcation without stenosis. Bulky calcified plaque about the right bifurcation/proximal right ICA with associated stenosis of up to 75% by NASCET criteria. Right ICA otherwise widely patent distally to the skull base without stenosis, dissection or occlusion. Left carotid system: Calcified plaque at the origin of the left CCA without significant stenosis. Left CCA widely patent distally to the bifurcation. Scattered mixed plaque about the left bifurcation without hemodynamically significant stenosis. Left ICA widely patent distally to the skull base without stenosis, dissection or occlusion. Vertebral arteries: Both vertebral arteries arise from the subclavian arteries. Vertebral arteries widely patent within the neck without stenosis, dissection or occlusion. Skeleton: No acute osseous abnormality. No discrete lytic or blastic osseous lesions. Prior ACDF at C4-5 noted. Patient is edentulous. Mild degenerative changes noted about the TMJs bilaterally. Other neck: No other acute soft tissue abnormality within the neck. No adenopathy. Normal thyroid. Soft tissue lesions seen within both parotid glands measuring up to 9 mm on the left and 8 mm on the right noted, indeterminate, but felt to be of doubtful significance given patient age. Upper chest: Visualized upper chest demonstrates no acute finding.  Irregular biapical pleuroparenchymal thickening noted about the lung apices. Visualized lungs are otherwise clear. Review of the MIP images confirms the above findings CTA HEAD FINDINGS Anterior circulation: Right ICA widely patent through the siphon without stenosis. Scattered atheromatous plaque within the cavernous and supraclinoid left ICA with no more than mild multifocal stenosis. A1 segments widely patent. Normal anterior communicating artery. Anterior cerebral arteries widely patent to their distal aspects without stenosis. No M1 stenosis or occlusion. Normal MCA bifurcations. Distal MCA branches well perfused and symmetric. Posterior circulation: Vertebral arteries widely patent to the vertebrobasilar junction without stenosis. Left vertebral dominant. Patent left PICA. Right PICA not seen. Basilar widely patent to its distal aspect without stenosis. Superior cerebral arteries patent bilaterally. Left PCA primarily supplied via the basilar. Fetal type origin of the right PCA. Short-segment severe stenosis at the left P1/P2 junction (series 11, image 20). PCAs otherwise widely patent to their distal aspects. Venous sinuses: Patent. Anatomic variants: Fetal type origin of the right PCA. No intracranial aneurysm. Review of the MIP images confirms the above findings IMPRESSION: CT HEAD IMPRESSION: 1. No acute intracranial abnormality identified. 2. Aspects equals 10/10. 3. Age-related cerebral atrophy with moderate chronic microvascular ischemic disease, with small remote left cerebellar infarct. CTA HEAD AND NECK IMPRESSION: 1. Negative CTA for large vessel occlusion. 2. Bulky calcified plaque about the right carotid bifurcation with associated stenosis of up to 75% by NASCET criteria. 3. Short-segment severe stenosis at the left P1/P2 junction. 4. Mild for age atheromatous change elsewhere about the major arterial vasculature of the head and neck. No other high-grade or correctable stenosis. Critical  Value/emergent results were  called by telephone at the time of interpretation on 10/16/2019 at 7:27 pm to provider [facility] , who verbally acknowledged these results. Electronically Signed   By: Jeannine Boga M.D.   On: 10/16/2019 20:07   MR BRAIN WO CONTRAST  Result Date: 10/16/2019 CLINICAL DATA:  86 year old female with code stroke presentation earlier today. Slurred speech, left facial droop, confusion. EXAM: MRI HEAD WITHOUT CONTRAST TECHNIQUE: Multiplanar, multiecho pulse sequences of the brain and surrounding structures were obtained without intravenous contrast. COMPARISON:  CT head, CTA head and neck earlier today. FINDINGS: Brain: Patchy restricted diffusion of the right frontal operculum and subcortical white matter tracking toward the right motor strip (series 12, image 37, and series 5, image 15). Faint T2 and FLAIR hyperintensity. No evidence of hemorrhage. No mass effect. No other restricted diffusion. Bilateral cerebral white matter confluent T2 and FLAIR hyperintensity with deep white matter capsule involvement. Small chronic infarcts in the cerebellum mostly the left (series 7, image 3). Comparatively mild T2 heterogeneity in the bilateral deep gray nuclei and brainstem. No chronic cortical encephalomalacia identified. No chronic cerebral blood products identified. No midline shift, mass effect, evidence of mass lesion, ventriculomegaly, extra-axial collection or acute intracranial hemorrhage. Cervicomedullary junction and pituitary are within normal limits. Vascular: Major intracranial vascular flow voids are preserved. Skull and upper cervical spine: Negative for age visible cervical spine. Visualized bone marrow signal is within normal limits. Sinuses/Orbits: Postoperative changes to both globes, otherwise negative orbits. Paranasal Visualized paranasal sinuses and mastoids are stable and well pneumatized. Other: Scalp and face soft tissues appear negative. IMPRESSION: 1. Patchy  acute infarct in the Right MCA territory: right frontal operculum and middle frontal gyrus. No associated hemorrhage or mass effect. 2. Underlying chronic ischemic disease mostly affecting the cerebral white matter and the cerebellum. Electronically Signed   By: Genevie Ann M.D.   On: 10/16/2019 21:43   ECHOCARDIOGRAM COMPLETE  Result Date: 10/17/2019   ECHOCARDIOGRAM REPORT   Patient Name:   JOSEPHENE MARRONE Date of Exam: 10/17/2019 Medical Rec #:  [id number]          Height:       65.0 in Accession #:    [id number]         Weight:       163.6 lb Date of Birth:  03/01/1923           BSA:          1.82 m Patient Age:    72 years           BP:           107/50 mmHg Patient Gender: F                  HR:           59 bpm. Exam Location:  [facility] Procedure: 2D Echo Indications:    TIA 435.9 / G45.9  History:        Patient has no prior history of Echocardiogram examinations.                 Stroke and TIA; Risk Factors:Hypertension and Diabetes.  Sonographer:    Leavy Cella RDCS (AE) Referring Phys: [place]  1. Left ventricular ejection fraction, by visual estimation, is 60 to 65%. The left ventricle has normal function. There is mildly increased left ventricular hypertrophy.  2. Elevated left ventricular end-diastolic pressure.  3. Left ventricular diastolic parameters are consistent with Grade I diastolic dysfunction (  impaired relaxation).  4. The left ventricle has no regional wall motion abnormalities.  5. Global right ventricle has normal systolic function.The right ventricular size is normal. No increase in right ventricular wall thickness.  6. Left atrial size was normal.  7. Right atrial size was normal.  8. Moderate mitral annular calcification.  9. The mitral valve is degenerative. Trivial mitral valve regurgitation. Mild mitral stenosis. 10. The tricuspid valve is grossly normal. 11. The tricuspid valve is grossly normal. Tricuspid valve regurgitation is mild. 12. The aortic valve is  tricuspid. Aortic valve regurgitation is not visualized. Moderate to severe aortic valve stenosis. 13. The pulmonic valve was grossly normal. Pulmonic valve regurgitation is not visualized. 14. Moderately elevated pulmonary artery systolic pressure. 15. The inferior vena cava is normal in size with greater than 50% respiratory variability, suggesting right atrial pressure of 3 mmHg. FINDINGS  Left Ventricle: Left ventricular ejection fraction, by visual estimation, is 60 to 65%. The left ventricle has normal function. The left ventricle has no regional wall motion abnormalities. There is mildly increased left ventricular hypertrophy. Concentric left ventricular hypertrophy. Left ventricular diastolic parameters are consistent with Grade I diastolic dysfunction (impaired relaxation). Elevated left ventricular end-diastolic pressure. Right Ventricle: The right ventricular size is normal. No increase in right ventricular wall thickness. Global RV systolic function is has normal systolic function. The tricuspid regurgitant velocity is 2.86 m/s, and with an assumed right atrial pressure  of 10 mmHg, the estimated right ventricular systolic pressure is moderately elevated at 42.7 mmHg. Left Atrium: Left atrial size was normal in size. Right Atrium: Right atrial size was normal in size Pericardium: There is no evidence of pericardial effusion. Mitral Valve: The mitral valve is degenerative in appearance. There is mild thickening of the mitral valve leaflet(s). There is mild calcification of the mitral valve leaflet(s). Moderate mitral annular calcification. Trivial mitral valve regurgitation. Mild mitral valve stenosis by observation. MV peak gradient, 11.0 mmHg. Tricuspid Valve: The tricuspid valve is grossly normal. Tricuspid valve regurgitation is mild. Aortic Valve: The aortic valve is tricuspid. . There is mild thickening and mild calcification of the aortic valve. Aortic valve regurgitation is not visualized.  Moderate to severe aortic stenosis is present. Moderate aortic valve annular calcification. There is mild thickening of the aortic valve. There is mild calcification of the aortic valve. Aortic valve mean gradient measures 34.3 mmHg. Aortic valve peak gradient measures 57.1 mmHg. Pulmonic Valve: The pulmonic valve was grossly normal. Pulmonic valve regurgitation is not visualized. Pulmonic regurgitation is not visualized. Aorta: The aortic root is normal in size and structure. Venous: The inferior vena cava is normal in size with greater than 50% respiratory variability, suggesting right atrial pressure of 3 mmHg. IAS/Shunts: No atrial level shunt detected by color flow Doppler.  LEFT VENTRICLE PLAX 2D LVIDd:         3.77 cm Diastology LVIDs:         2.97 cm LV e' lateral:   5.03 cm/s LV PW:         1.32 cm LV E/e' lateral: 25.6 LV IVS:        1.04 cm LV e' medial:    4.30 cm/s LV SV:         27 ml   LV E/e' medial:  30.0 LV SV Index:   14.31  RIGHT VENTRICLE RV S prime:     10.90 cm/s TAPSE (M-mode): 2.2 cm LEFT ATRIUM  Index       RIGHT ATRIUM           Index LA diam:        3.70 cm 2.04 cm/m  RA Area:     14.30 cm LA Vol (A2C):   68.1 ml 37.50 ml/m RA Volume:   36.50 ml  20.10 ml/m LA Vol (A4C):   30.5 ml 16.79 ml/m LA Biplane Vol: 50.6 ml 27.86 ml/m  AORTIC VALVE AV Vmax:           377.67 cm/s AV Vmean:          274.000 cm/s AV VTI:            0.930 m AV Peak Grad:      57.1 mmHg AV Mean Grad:      34.3 mmHg LVOT Vmax:         139.67 cm/s LVOT Vmean:        107.267 cm/s LVOT VTI:          0.342 m LVOT/AV VTI ratio: 0.37  AORTA Ao Root diam: 2.30 cm MITRAL VALVE                         TRICUSPID VALVE MV Area (PHT): 1.76 cm              TR Peak grad:   32.7 mmHg MV Peak grad:  11.0 mmHg             TR Vmax:        286.00 cm/s MV Mean grad:  4.0 mmHg MV Vmax:       1.66 m/s              SHUNTS MV Vmean:      97.4 cm/s             Systemic VTI: 0.34 m MV VTI:        0.53 m MV PHT:        124.70  msec MV Decel Time: 430 msec MV E velocity: 129.00 cm/s 103 cm/s MV A velocity: 160.00 cm/s 70.3 cm/s MV E/A ratio:  0.81        1.5  Kate Sable MD Electronically signed by Kate Sable MD Signature Date/Time: 10/17/2019/11:35:16 AM    Final    CT HEAD CODE STROKE WO CONTRAST  Result Date: 10/16/2019 CLINICAL DATA:  Initial evaluation for acute left facial weakness. EXAM: CT ANGIOGRAPHY HEAD AND NECK TECHNIQUE: Multidetector CT imaging of the head and neck was performed using the standard protocol during bolus administration of intravenous contrast. Multiplanar CT image reconstructions and MIPs were obtained to evaluate the vascular anatomy. Carotid stenosis measurements (when applicable) are obtained utilizing NASCET criteria, using the distal internal carotid diameter as the denominator. CONTRAST:  100 cc of Omnipaque 350. COMPARISON:  None available. FINDINGS: CT HEAD FINDINGS Brain: Generalized age-related cerebral atrophy. Patchy and confluent hypodensity within the periventricular deep white matter both cerebral hemispheres most consistent with chronic small vessel ischemic disease, moderate in nature. Small remote left cerebellar infarct noted. No acute intracranial hemorrhage. No acute large vessel territory infarct. No mass lesion, midline shift or mass effect. No hydrocephalus. No extra-axial fluid collection. Vascular: No hyperdense vessel. Calcified atherosclerosis present at the skull base. Skull: Scalp soft tissues within normal limits.  Calvarium intact. Sinuses/Orbits: Globes and orbital soft tissues demonstrate no acute finding. Right gaze noted. Paranasal sinuses and mastoid air cells are clear. Other: None. ASPECTS [facility]  Stroke Program Early CT Score) - Ganglionic level infarction (caudate, lentiform nuclei, internal capsule, insula, M1-M3 cortex): 7 - Supraganglionic infarction (M4-M6 cortex): 3 Total score (0-10 with 10 being normal): 10 CTA NECK FINDINGS Aortic arch:  Visualized aortic arch of normal caliber with normal 3 vessel morphology. Moderate atherosclerotic change about the visualized arch and origin of the great vessels without hemodynamically significant stenosis. Visualized subclavian arteries widely patent. Right carotid system: Right common carotid artery patent from its origin to the bifurcation without stenosis. Bulky calcified plaque about the right bifurcation/proximal right ICA with associated stenosis of up to 75% by NASCET criteria. Right ICA otherwise widely patent distally to the skull base without stenosis, dissection or occlusion. Left carotid system: Calcified plaque at the origin of the left CCA without significant stenosis. Left CCA widely patent distally to the bifurcation. Scattered mixed plaque about the left bifurcation without hemodynamically significant stenosis. Left ICA widely patent distally to the skull base without stenosis, dissection or occlusion. Vertebral arteries: Both vertebral arteries arise from the subclavian arteries. Vertebral arteries widely patent within the neck without stenosis, dissection or occlusion. Skeleton: No acute osseous abnormality. No discrete lytic or blastic osseous lesions. Prior ACDF at C4-5 noted. Patient is edentulous. Mild degenerative changes noted about the TMJs bilaterally. Other neck: No other acute soft tissue abnormality within the neck. No adenopathy. Normal thyroid. Soft tissue lesions seen within both parotid glands measuring up to 9 mm on the left and 8 mm on the right noted, indeterminate, but felt to be of doubtful significance given patient age. Upper chest: Visualized upper chest demonstrates no acute finding. Irregular biapical pleuroparenchymal thickening noted about the lung apices. Visualized lungs are otherwise clear. Review of the MIP images confirms the above findings CTA HEAD FINDINGS Anterior circulation: Right ICA widely patent through the siphon without stenosis. Scattered atheromatous  plaque within the cavernous and supraclinoid left ICA with no more than mild multifocal stenosis. A1 segments widely patent. Normal anterior communicating artery. Anterior cerebral arteries widely patent to their distal aspects without stenosis. No M1 stenosis or occlusion. Normal MCA bifurcations. Distal MCA branches well perfused and symmetric. Posterior circulation: Vertebral arteries widely patent to the vertebrobasilar junction without stenosis. Left vertebral dominant. Patent left PICA. Right PICA not seen. Basilar widely patent to its distal aspect without stenosis. Superior cerebral arteries patent bilaterally. Left PCA primarily supplied via the basilar. Fetal type origin of the right PCA. Short-segment severe stenosis at the left P1/P2 junction (series 11, image 20). PCAs otherwise widely patent to their distal aspects. Venous sinuses: Patent. Anatomic variants: Fetal type origin of the right PCA. No intracranial aneurysm. Review of the MIP images confirms the above findings IMPRESSION: CT HEAD IMPRESSION: 1. No acute intracranial abnormality identified. 2. Aspects equals 10/10. 3. Age-related cerebral atrophy with moderate chronic microvascular ischemic disease, with small remote left cerebellar infarct. CTA HEAD AND NECK IMPRESSION: 1. Negative CTA for large vessel occlusion. 2. Bulky calcified plaque about the right carotid bifurcation with associated stenosis of up to 75% by NASCET criteria. 3. Short-segment severe stenosis at the left P1/P2 junction. 4. Mild for age atheromatous change elsewhere about the major arterial vasculature of the head and neck. No other high-grade or correctable stenosis. Critical Value/emergent results were called by telephone at the time of interpretation on 10/16/2019 at 7:27 pm to provider [facility] , who verbally acknowledged these results. Electronically Signed   By: Jeannine Boga M.D.   On: 10/16/2019 20:07  Scheduled Meds: .  stroke: mapping our  early stages of recovery book   Does not apply Once  . ALPRAZolam  0.5 mg Oral QHS  . aspirin  300 mg Rectal Daily   Or  . aspirin  325 mg Oral Daily  . clopidogrel  75 mg Oral Daily   Continuous Infusions:   LOS: 1 day    Time spent: 30 minutes    Cedric Mcclaine Darleen Crocker, DO [facility] Pager [phone]  If 7PM-7AM, please contact night-coverage [email or web address] Password [facility] 10/18/2019, 9:34 AM

## 2019-10-18 NOTE — Consult Note (Signed)
Hospital Consult    Reason for Consult: Symptomatic right carotid stenosis Referring Physician: Dr. Manuella Ghazi MRN #:  [id number]  History of Present Illness: This is a 86 y.o. female presented to [facility] with slurred speech and facial droop.  She states that she was drooling at the time.  Has never had previous stroke.  Right MCA distribution stroke was diagnosed with high-grade stenosis of her ICA on the right.  She is now transferred for vascular surgical evaluation.  She states that most of her symptoms have resolved although she does note some deficit in her speech.  She is moving all of her extremities without limitation never had any numbness or tingling or motor deficits during her episode.  She does take full dose aspirin at baseline states that she cannot take statins due to muscle pain although it is not listed as a frank allergy.  Plavix has been added to her regimen per neurology.  Patient has never had vascular surgery in the past surgeries are limited to tonsillectomy and hip replacement surgery.  She is a lifelong non-smoker.  She is diabetic and has hypertension has risk factors for vascular disease.  Past Medical History:  Diagnosis Date  . Carotid stenosis, right 10/16/2019   75% on CTA neck  . Diabetes ([place])   . Hypertension   . Mild cognitive impairment   . Pneumonia     Past Surgical History:  Procedure Laterality Date  . HIP ARTHROPLASTY Right   . TONSILLECTOMY      No Known Allergies  Prior to Admission medications   Medication Sig Start Date End Date Taking? Authorizing Provider  ALPRAZolam Duanne Moron) 0.5 MG tablet Take 0.5 mg by mouth at bedtime. *May take one tablet three times daily as needed for anxiety**   Yes [provider]  aspirin EC 81 MG tablet Take 81 mg by mouth daily.   Yes [provider]  glimepiride (AMARYL) 1 MG tablet Take 1 mg by mouth daily with breakfast.   Yes [provider]  metoprolol tartrate  (LOPRESSOR) 25 MG tablet Take 25 mg by mouth 2 (two) times daily.   Yes [provider]  omeprazole (PRILOSEC) 20 MG capsule Take 20 mg by mouth daily. *May take one capsule twice daily   Yes [provider]    Social History   Socioeconomic History  . Marital status: Divorced    Spouse name: Not on file  . Number of children: Not on file  . Years of education: Not on file  . Highest education level: Not on file  Occupational History  . Not on file  Tobacco Use  . Smoking status: Never Smoker  . Smokeless tobacco: Never Used  Substance and Sexual Activity  . Alcohol use: Never  . Drug use: Never  . Sexual activity: Not Currently  Other Topics Concern  . Not on file  Social History Narrative  . Not on file   Social Determinants of Health   Financial Resource Strain:   . Difficulty of Paying Living Expenses: Not on file  Food Insecurity:   . Worried About Charity fundraiser in the Last Year: Not on file  . Ran Out of Food in the Last Year: Not on file  Transportation Needs:   . Lack of Transportation (Medical): Not on file  . Lack of Transportation (Non-Medical): Not on file  Physical Activity:   . Days of Exercise per Week: Not on file  . Minutes of Exercise per  Session: Not on file  Stress:   . Feeling of Stress : Not on file  Social Connections:   . Frequency of Communication with Friends and Family: Not on file  . Frequency of Social Gatherings with Friends and Family: Not on file  . Attends Religious Services: Not on file  . Active Member of Clubs or Organizations: Not on file  . Attends Archivist Meetings: Not on file  . Marital Status: Not on file  Intimate Partner Violence:   . Fear of Current or Ex-Partner: Not on file  . Emotionally Abused: Not on file  . Physically Abused: Not on file  . Sexually Abused: Not on file     Family History  Problem Relation Age of Onset  . Cirrhosis Mother        no etoh use  . CAD Father       ROS:  Cardiovascular: []  chest pain/pressure []  palpitations []  SOB lying flat []  DOE []  pain in legs while walking []  pain in legs at rest []  pain in legs at night []  non-healing ulcers []  hx of DVT []  swelling in legs  Pulmonary: []  productive cough []  asthma/wheezing []  home O2  Neurologic: []  weakness in []  arms []  legs []  numbness in []  arms []  legs [x]  hx of CVA []  mini stroke [x] difficulty speaking or slurred speech []  temporary loss of vision in one eye []  dizziness  Hematologic: []  hx of cancer []  bleeding problems []  problems with blood clotting easily  Endocrine:   [x]  diabetes []  thyroid disease  GI []  vomiting blood []  blood in stool  GU: []  CKD/renal failure []  HD--[]  M/W/F or []  T/T/S []  burning with urination []  blood in urine  Psychiatric: []  anxiety []  depression  Musculoskeletal: []  arthritis []  joint pain  Integumentary: []  rashes []  ulcers  Constitutional: []  fever []  chills   Physical Examination  Vitals:   10/18/19 0621 10/18/19 0852  BP: 123/67 131/84  Pulse: 78 65  Resp: 18 18  Temp: 97.6 F (36.4 C) 97.6 F (36.4 C)  SpO2: 100% 100%   Body mass index is 27.22 kg/m.  General:  nad HENT: WNL, normocephalic Pulmonary: normal non-labored breathing Cardiac: Palpable radial and common femoral pulses bilaterally Abdomen: soft, NT/ND, no masses Extremities: Warm and well-perfused without edema Musculoskeletal: no muscle wasting or atrophy  Neurologic: A&O X 3; Appropriate Affect ; SENSATION: normal; MOTOR FUNCTION:  moving all extremities equally. Speech is fluent/normal   CBC    Component Value Date/Time   WBC 6.2 10/17/2019 0501   RBC 3.81 (L) 10/17/2019 0501   HGB 12.0 10/17/2019 0501   HCT 36.4 10/17/2019 0501   PLT 249 10/17/2019 0501   MCV 95.5 10/17/2019 0501   MCH 31.5 10/17/2019 0501   MCHC 33.0 10/17/2019 0501   RDW 13.2 10/17/2019 0501   LYMPHSABS 2.8 10/16/2019 1903   MONOABS 0.6  10/16/2019 1903   EOSABS 0.1 10/16/2019 1903   BASOSABS 0.0 10/16/2019 1903    BMET    Component Value Date/Time   NA 140 10/17/2019 0501   K 3.7 10/17/2019 0501   CL 105 10/17/2019 0501   CO2 25 10/17/2019 0501   GLUCOSE 108 (H) 10/17/2019 0501   BUN 17 10/17/2019 0501   CREATININE 1.03 (H) 10/17/2019 0501   CALCIUM 8.8 (L) 10/17/2019 0501   GFRNONAA 46 (L) 10/17/2019 0501   GFRAA 54 (L) 10/17/2019 0501    COAGS: Lab Results  Component Value Date   INR  1.0 10/16/2019     Non-Invasive Vascular Imaging:   CT HEAD IMPRESSION:  1. No acute intracranial abnormality identified. 2. Aspects equals 10/10. 3. Age-related cerebral atrophy with moderate chronic microvascular ischemic disease, with small remote left cerebellar infarct.  CTA HEAD AND NECK IMPRESSION:  1. Negative CTA for large vessel occlusion. 2. Bulky calcified plaque about the right carotid bifurcation with associated stenosis of up to 75% by NASCET criteria. 3. Short-segment severe stenosis at the left P1/P2 junction. 4. Mild for age atheromatous change elsewhere about the major arterial vasculature of the head and neck. No other high-grade or correctable stenosis.  ASSESSMENT/PLAN: This is a 86 y.o. female admitted with right MCA distribution stroke and high-grade stenosis right ICA.  I discussed options with patient's being medical management versus surgical intervention to include carotid endarterectomy versus stenting.  Patient stated goal is to live to 89 and be fully functional.  This certainly seems to be a reasonable goal and she certainly is fully functional at this time and lives independently at the age of almost 56.  Given the appearance of her lesion by CT scan I think carotid endarterectomy is the best interventional choice given the high degree of calcification that may prevent full expansion of stenting.  I have discussed this with the patient as well as her daughter via telephone.  I have  offered right carotid endarterectomy by my partner on Monday or myself on Tuesday they have elected to wait until Tuesday.  I will get her scheduled for that day.  Okay to continue aspirin and Plavix throughout the procedure.  Ivyrose Hashman C. Donzetta Matters, MD [facility] [place] Office: [phone] Pager: [phone]

## 2019-10-18 NOTE — Progress Notes (Signed)
NAE, patient with bed ready at [facility]. Waiting on transport

## 2019-10-19 LAB — BASIC METABOLIC PANEL
Anion gap: 10 (ref 5–15)
BUN: 14 mg/dL (ref 8–23)
CO2: 25 mmol/L (ref 22–32)
Calcium: 9.1 mg/dL (ref 8.9–10.3)
Chloride: 106 mmol/L (ref 98–111)
Creatinine, Ser: 1.02 mg/dL — ABNORMAL HIGH (ref 0.44–1.00)
GFR calc Af Amer: 55 mL/min — ABNORMAL LOW (ref >60)
GFR calc non Af Amer: 47 mL/min — ABNORMAL LOW (ref >60)
Glucose, Bld: 114 mg/dL — ABNORMAL HIGH (ref 70–99)
Potassium: 4.3 mmol/L (ref 3.5–5.1)
Sodium: 141 mmol/L (ref 135–145)

## 2019-10-19 LAB — GLUCOSE, CAPILLARY
Glucose-Capillary: 105 mg/dL — ABNORMAL HIGH (ref 70–99)
Glucose-Capillary: 111 mg/dL — ABNORMAL HIGH (ref 70–99)
Glucose-Capillary: 113 mg/dL — ABNORMAL HIGH (ref 70–99)
Glucose-Capillary: 125 mg/dL — ABNORMAL HIGH (ref 70–99)

## 2019-10-19 LAB — CBC
HCT: 38.3 % (ref 36.0–46.0)
Hemoglobin: 12.5 g/dL (ref 12.0–15.0)
MCH: 30.9 pg (ref 26.0–34.0)
MCHC: 32.6 g/dL (ref 30.0–36.0)
MCV: 94.6 fL (ref 80.0–100.0)
Platelets: 258 10*3/uL (ref 150–400)
RBC: 4.05 MIL/uL (ref 3.87–5.11)
RDW: 13.1 % (ref 11.5–15.5)
WBC: 6.7 10*3/uL (ref 4.0–10.5)
nRBC: 0 % (ref 0.0–0.2)

## 2019-10-19 MED ORDER — ATORVASTATIN CALCIUM 40 MG PO TABS
40.0000 mg | ORAL_TABLET | Freq: Every day | ORAL | Status: DC
  Administered 2019-10-19 – 2019-10-21 (×3): 40 mg via ORAL
  Filled 2019-10-19 (×4): qty 1

## 2019-10-19 NOTE — Progress Notes (Signed)
  Progress Note    10/19/2019 10:06 AM * No surgery found *  Subjective:  No overnight issues, speech improving  Vitals:   10/19/19 0400 10/19/19 0845  BP: 131/76 120/74  Pulse: 78 82  Resp: 19 18  Temp: 98.4 F (36.9 C) 98.6 F (37 C)  SpO2: 97% 100%    Physical Exam: [id number] Moving all extremities   CBC    Component Value Date/Time   WBC 6.7 10/19/2019 0125   RBC 4.05 10/19/2019 0125   HGB 12.5 10/19/2019 0125   HCT 38.3 10/19/2019 0125   PLT 258 10/19/2019 0125   MCV 94.6 10/19/2019 0125   MCH 30.9 10/19/2019 0125   MCHC 32.6 10/19/2019 0125   RDW 13.1 10/19/2019 0125   LYMPHSABS 2.8 10/16/2019 1903   MONOABS 0.6 10/16/2019 1903   EOSABS 0.1 10/16/2019 1903   BASOSABS 0.0 10/16/2019 1903    BMET    Component Value Date/Time   NA 141 10/19/2019 0125   K 4.3 10/19/2019 0125   CL 106 10/19/2019 0125   CO2 25 10/19/2019 0125   GLUCOSE 114 (H) 10/19/2019 0125   BUN 14 10/19/2019 0125   CREATININE 1.02 (H) 10/19/2019 0125   CALCIUM 9.1 10/19/2019 0125   GFRNONAA 47 (L) 10/19/2019 0125   GFRAA 55 (L) 10/19/2019 0125    INR    Component Value Date/Time   INR 1.0 10/16/2019 1903     Intake/Output Summary (Last 24 hours) at 10/19/2019 1006 Last data filed at 10/19/2019 0856 Gross per 24 hour  Intake 1080 ml  Output --  Net 1080 ml     Assessment:  86 y.o. female is here with R MCA cva  Plan: R CEA on Tuesday  Helmuth Recupero C. Donzetta Matters, MD [facility] Office: [phone] Pager: [phone]  10/19/2019 10:06 AM

## 2019-10-19 NOTE — Progress Notes (Signed)
PROGRESS NOTE    Denise Parrish  WGN:[id number] DOB: [birth year] DOA: 10/16/2019 PCP: [name], MD  Brief Narrative: 86 year old female with type 2 diabetes and hypertension admitted with slurred speech and left facial droop which has been resolved.  However she is found to have 75% right carotid occlusion she was [facility] to [facility] for vascular surgery consult.  She is scheduled to have a right CEA this week. 10/19/2019-patient awake alert very hard of hearing appropriate answering questions appropriately and following commands.  Assessment & Plan:   Principal Problem:   Stroke [facility]) Active Problems:   TIA (transient ischemic attack)   Slurred speech   Facial droop   Essential hypertension   Diabetes ([place])   CVA (cerebral vascular accident) ([place])    #1 right MCA distribution stroke with left facial droop, and slurred speech which has been resolved. Echo normal ejection fraction grade 1 diastolic dysfunction LDL is 98 Hemoglobin A1c 6.1 She was seen by PT OT and speech therapy with no further recommendations on discharge Continue aspirin and Plavix and Lipitor.  #2 right carotid stenosis for surgery tomorrow continue aspirin Plavix and statin.  Appreciate vascular surgery input.  #3 essential hypertension blood pressure 120/74 she takes metoprolol 25 twice a day at home which is on hold  #4 anxiety continue Xanax  #5 type 2 diabetes-continue SSI she takes Amaryl at home which is on hold. CBG (last 3)  Recent Labs    10/16/19 1856 10/18/19 2116 10/19/19 0621  GLUCAP 115* 110* 105*    #6 GERD-on Prilosec at home.  Estimated body mass index is 27.22 kg/m as calculated from the following:   Height as of this encounter: 5' 5"  (1.651 m).   Weight as of this encounter: 74.2 kg.  DVT prophylaxis: SCD Code Status: Full code  family Communication:dw linda daughter  Disposition Plan:. Pending surgery February 2.  She came from home plan  is to discharge her back home.  She has all her children living around her house in the same neighborhood. Consultants:   Neurology and vascular surgery  Procedures: None Antimicrobials: None none Subjective:  Resting in bed anxious to get out of bed to go to the restroom Objective: Vitals:   10/18/19 1958 10/19/19 0034 10/19/19 0400 10/19/19 0845  BP: 131/84 (!) 108/49 131/76 120/74  Pulse: 66 82 78 82  Resp: 15 18 19 18   Temp: 98.5 F (36.9 C) 98.1 F (36.7 C) 98.4 F (36.9 C) 98.6 F (37 C)  TempSrc: Oral Oral Oral Oral  SpO2: 99% 98% 97% 100%  Weight:      Height:        Intake/Output Summary (Last 24 hours) at 10/19/2019 1140 Last data filed at 10/19/2019 0856 Gross per 24 hour  Intake 1080 ml  Output --  Net 1080 ml   Filed Weights   10/16/19 1944 10/16/19 2225  Weight: 75.6 kg 74.2 kg    Examination:  General exam: Appears calm and comfortable  Respiratory system: Clear to auscultation. Respiratory effort normal. Cardiovascular system: S1 & S2 heard, RRR. No JVD, murmurs, rubs, gallops or clicks. No pedal edema. Gastrointestinal system: Abdomen is nondistended, soft and nontender. No organomegaly or masses felt. Normal bowel sounds heard. Central nervous system: Alert and oriented. No focal neurological deficits. Extremities: Symmetric 5 x 5 power. Skin: No rashes, lesions or ulcers Psychiatry: Judgement and insight appear normal. Mood & affect appropriate.     Data Reviewed: I have personally reviewed following  labs and imaging studies  CBC: Recent Labs  Lab 10/16/19 1903 10/17/19 0501 10/19/19 0125  WBC 6.4 6.2 6.7  NEUTROABS 2.8  --   --   HGB 13.8 12.0 12.5  HCT 42.7 36.4 38.3  MCV 95.5 95.5 94.6  PLT 295 249 884   Basic Metabolic Panel: Recent Labs  Lab 10/16/19 1903 10/17/19 0501 10/19/19 0125  NA 139 140 141  K 4.2 3.7 4.3  CL 103 105 106  CO2 28 25 25   GLUCOSE 122* 108* 114*  BUN 17 17 14   CREATININE 1.12* 1.03* 1.02*   CALCIUM 9.4 8.8* 9.1   GFR: Estimated Creatinine Clearance: 34 mL/min (A) (by C-G formula based on SCr of 1.02 mg/dL (H)). Liver Function Tests: Recent Labs  Lab 10/16/19 1903 10/17/19 0501  AST 24 19  ALT 20 17  ALKPHOS 66 52  BILITOT 0.8 0.6  PROT 7.4 5.9*  ALBUMIN 4.1 3.4*   No results for input(s): LIPASE, AMYLASE in the last 168 hours. No results for input(s): AMMONIA in the last 168 hours. Coagulation Profile: Recent Labs  Lab 10/16/19 1903  INR 1.0   Cardiac Enzymes: No results for input(s): CKTOTAL, CKMB, CKMBINDEX, TROPONINI in the last 168 hours. BNP (last 3 results) No results for input(s): PROBNP in the last 8760 hours. HbA1C: Recent Labs    10/17/19 0501  HGBA1C 6.1*   CBG: Recent Labs  Lab 10/16/19 1856 10/18/19 2116 10/19/19 0621  GLUCAP 115* 110* 105*   Lipid Profile: Recent Labs    10/17/19 0501  CHOL 154  HDL 32*  LDLCALC 98  TRIG 120  CHOLHDL 4.8   Thyroid Function Tests: No results for input(s): TSH, T4TOTAL, FREET4, T3FREE, THYROIDAB in the last 72 hours. Anemia Panel: No results for input(s): VITAMINB12, FOLATE, FERRITIN, TIBC, IRON, RETICCTPCT in the last 72 hours. Sepsis Labs: No results for input(s): PROCALCITON, LATICACIDVEN in the last 168 hours.  Recent Results (from the past 240 hour(s))  Respiratory Panel by RT PCR (Flu A&B, Covid) - Nasopharyngeal Swab     Status: None   Collection Time: 10/16/19  7:06 PM   Specimen: Nasopharyngeal Swab  Result Value Ref Range Status   SARS Coronavirus 2 by RT PCR NEGATIVE NEGATIVE Final    Comment: (NOTE) SARS-CoV-2 target nucleic acids are NOT DETECTED. The SARS-CoV-2 RNA is generally detectable in upper respiratoy specimens during the acute phase of infection. The lowest concentration of SARS-CoV-2 viral copies this assay can detect is 131 copies/mL. A negative result does not preclude SARS-Cov-2 infection and should not be used as the sole basis for treatment or other patient  management decisions. A negative result may occur with  improper specimen collection/handling, submission of specimen other than nasopharyngeal swab, presence of viral mutation(s) within the areas targeted by this assay, and inadequate number of viral copies (<131 copies/mL). A negative result must be combined with clinical observations, patient history, and epidemiological information. The expected result is Negative. Fact Sheet for Patients:  PinkCheek.be Fact Sheet for Healthcare Providers:  GravelBags.it This test is not yet ap proved or cleared by the [place] FDA and  has been authorized for detection and/or diagnosis of SARS-CoV-2 by FDA under an Emergency Use Authorization (EUA). This EUA will remain  in effect (meaning this test can be used) for the duration of the COVID-19 declaration under Section 564(b)(1) of the Act, 21 U.S.C. section 360bbb-3(b)(1), unless the authorization is terminated or revoked sooner.    Influenza A by PCR NEGATIVE NEGATIVE  Final   Influenza B by PCR NEGATIVE NEGATIVE Final    Comment: (NOTE) The Xpert Xpress SARS-CoV-2/FLU/RSV assay is intended as an aid in  the diagnosis of influenza from Nasopharyngeal swab specimens and  should not be used as a sole basis for treatment. Nasal washings and  aspirates are unacceptable for Xpert Xpress SARS-CoV-2/FLU/RSV  testing. Fact Sheet for Patients: PinkCheek.be Fact Sheet for Healthcare Providers: GravelBags.it This test is not yet approved or cleared by the [place] FDA and  has been authorized for detection and/or diagnosis of SARS-CoV-2 by  FDA under an Emergency Use Authorization (EUA). This EUA will remain  in effect (meaning this test can be used) for the duration of the  Covid-19 declaration under Section 564(b)(1) of the Act, 21  U.S.C. section 360bbb-3(b)(1), unless the  authorization is  terminated or revoked. Performed at [facility], [address], [place], [place] [address]          Radiology Studies: No results found.      Scheduled Meds: .  stroke: mapping our early stages of recovery book   Does not apply Once  . ALPRAZolam  0.5 mg Oral QHS  . aspirin  300 mg Rectal Daily   Or  . aspirin  325 mg Oral Daily  . clopidogrel  75 mg Oral Daily  . insulin aspart  0-5 Units Subcutaneous QHS  . insulin aspart  0-9 Units Subcutaneous TID WC   Continuous Infusions:   LOS: 2 days     Georgette Shell, MD [facility]  If 7PM-7AM, please contact night-coverage [email or web address] Password [email or web address] 10/19/2019, 11:40 AM

## 2019-10-20 DIAGNOSIS — I6389 Other cerebral infarction: Secondary | ICD-10-CM

## 2019-10-20 DIAGNOSIS — I6521 Occlusion and stenosis of right carotid artery: Secondary | ICD-10-CM

## 2019-10-20 LAB — GLUCOSE, CAPILLARY
Glucose-Capillary: 111 mg/dL — ABNORMAL HIGH (ref 70–99)
Glucose-Capillary: 123 mg/dL — ABNORMAL HIGH (ref 70–99)
Glucose-Capillary: 99 mg/dL (ref 70–99)
Glucose-Capillary: 103 mg/dL — ABNORMAL HIGH (ref 70–99)

## 2019-10-20 MED ORDER — CEFAZOLIN SODIUM-DEXTROSE 2-4 GM/100ML-% IV SOLN
2.0000 g | INTRAVENOUS | Status: AC
  Administered 2019-10-21: 2 g via INTRAVENOUS
  Filled 2019-10-20 (×2): qty 100

## 2019-10-20 NOTE — Progress Notes (Signed)
PROGRESS NOTE    Denise Parrish  DSK:[id number] DOB: Mar 19, 1923 DOA: 10/16/2019 PCP: [name], MD    Brief Narrative:  87 year old female with type 2 diabetes and hypertension admitted with slurred speech and left facial droop which has been resolved.  However she is found to have 75% right carotid occlusion she was transferred [facility] for vascular surgery consult.  She is scheduled to have a right CEA this week. 10/19/2019-patient awake alert very hard of hearing appropriate answering questions appropriately and following commands.  Assessment & Plan:   Principal Problem:   Stroke [facility]) Active Problems:   TIA (transient ischemic attack)   Slurred speech   Facial droop   Essential hypertension   Diabetes ([place])   CVA (cerebral vascular accident) ([place])   #1 right MCA distribution stroke with left facial droop, and slurred speech which has been resolved. Echo normal ejection fraction grade 1 diastolic dysfunction LDL is 98 Hemoglobin A1c 6.1 She was seen by PT OT and speech therapy with no further recommendations on discharge Continue aspirin and Plavix and Lipitor.  #2 right carotid stenosis for CEA 10/21/2019.  Okay to continue aspirin Plavix.    #3 essential hypertension blood pressure 131/78 stable he is on metoprolol 25 mg twice a day at home which is not been restarted will monitor closely.  #4 anxiety continue Xanax  #5 type 2 diabetes-continue SSI she takes Amaryl at home which is on holD CBG (last 3)  Recent Labs    10/19/19 2117 10/20/19 0553 10/20/19 1106  GLUCAP 125* 111* 123*    #6 GERD continue Prilosec  Estimated body mass index is 27.22 kg/m as calculated from the following:   Height as of this encounter: 5' 5"  (1.651 m).   Weight as of this encounter: 74.2 kg.  DVT prophylaxis: SCD Code Status: Full code  family Communication:dw linda daughter  Disposition Plan:. Pending surgery February 2.  She came from home  plan is to discharge her back home.  She has all her children living around her house in the same neighborhood. Consultants:   Neurology and vascular surgery  Procedures: None Antimicrobials: None none  Subjective:  Patient resting in bed awake alert appropriate answering questions appropriately and following commands hard of hearing anxious to have surgery done tomorrow Objective: Vitals:   10/19/19 1953 10/19/19 2358 10/20/19 0336 10/20/19 0754  BP: (!) 138/56 108/61 (!) 115/49 131/78  Pulse: 90 96 79 81  Resp: 18 18 18 14   Temp: 98.7 F (37.1 C) 98 F (36.7 C) 98.2 F (36.8 C) 97.8 F (36.6 C)  TempSrc: Oral Oral Oral Oral  SpO2: 96% 98% 99% 97%  Weight:      Height:       No intake or output data in the 24 hours ending 10/20/19 1158 Filed Weights   10/16/19 1944 10/16/19 2225  Weight: 75.6 kg 74.2 kg    Examination:  General exam: Appears calm and comfortable  Respiratory system: Clear to auscultation. Respiratory effort normal. Cardiovascular system: S1 & S2 heard, RRR. No JVD, murmurs, rubs, gallops or clicks. No pedal edema. Gastrointestinal system: Abdomen is nondistended, soft and nontender. No organomegaly or masses felt. Normal bowel sounds heard. Central nervous system: Alert and oriented. No focal neurological deficits. Extremities: Symmetric 5 x 5 power. Skin: No rashes, lesions or ulcers Psychiatry: Judgement and insight appear normal. Mood & affect appropriate.     Data Reviewed: I have personally reviewed following labs and imaging studies  CBC: Recent Labs  Lab 10/16/19 1903 10/17/19 0501 10/19/19 0125  WBC 6.4 6.2 6.7  NEUTROABS 2.8  --   --   HGB 13.8 12.0 12.5  HCT 42.7 36.4 38.3  MCV 95.5 95.5 94.6  PLT 295 249 191   Basic Metabolic Panel: Recent Labs  Lab 10/16/19 1903 10/17/19 0501 10/19/19 0125  NA 139 140 141  K 4.2 3.7 4.3  CL 103 105 106  CO2 28 25 25   GLUCOSE 122* 108* 114*  BUN 17 17 14   CREATININE 1.12* 1.03*  1.02*  CALCIUM 9.4 8.8* 9.1   GFR: Estimated Creatinine Clearance: 34 mL/min (A) (by C-G formula based on SCr of 1.02 mg/dL (H)). Liver Function Tests: Recent Labs  Lab 10/16/19 1903 10/17/19 0501  AST 24 19  ALT 20 17  ALKPHOS 66 52  BILITOT 0.8 0.6  PROT 7.4 5.9*  ALBUMIN 4.1 3.4*   No results for input(s): LIPASE, AMYLASE in the last 168 hours. No results for input(s): AMMONIA in the last 168 hours. Coagulation Profile: Recent Labs  Lab 10/16/19 1903  INR 1.0   Cardiac Enzymes: No results for input(s): CKTOTAL, CKMB, CKMBINDEX, TROPONINI in the last 168 hours. BNP (last 3 results) No results for input(s): PROBNP in the last 8760 hours. HbA1C: No results for input(s): HGBA1C in the last 72 hours. CBG: Recent Labs  Lab 10/19/19 1215 10/19/19 1619 10/19/19 2117 10/20/19 0553 10/20/19 1106  GLUCAP 111* 113* 125* 111* 123*   Lipid Profile: No results for input(s): CHOL, HDL, LDLCALC, TRIG, CHOLHDL, LDLDIRECT in the last 72 hours. Thyroid Function Tests: No results for input(s): TSH, T4TOTAL, FREET4, T3FREE, THYROIDAB in the last 72 hours. Anemia Panel: No results for input(s): VITAMINB12, FOLATE, FERRITIN, TIBC, IRON, RETICCTPCT in the last 72 hours. Sepsis Labs: No results for input(s): PROCALCITON, LATICACIDVEN in the last 168 hours.  Recent Results (from the past 240 hour(s))  Respiratory Panel by RT PCR (Flu A&B, Covid) - Nasopharyngeal Swab     Status: None   Collection Time: 10/16/19  7:06 PM   Specimen: Nasopharyngeal Swab  Result Value Ref Range Status   SARS Coronavirus 2 by RT PCR NEGATIVE NEGATIVE Final    Comment: (NOTE) SARS-CoV-2 target nucleic acids are NOT DETECTED. The SARS-CoV-2 RNA is generally detectable in upper respiratoy specimens during the acute phase of infection. The lowest concentration of SARS-CoV-2 viral copies this assay can detect is 131 copies/mL. A negative result does not preclude SARS-Cov-2 infection and should not be  used as the sole basis for treatment or other patient management decisions. A negative result may occur with  improper specimen collection/handling, submission of specimen other than nasopharyngeal swab, presence of viral mutation(s) within the areas targeted by this assay, and inadequate number of viral copies (<131 copies/mL). A negative result must be combined with clinical observations, patient history, and epidemiological information. The expected result is Negative. Fact Sheet for Patients:  PinkCheek.be Fact Sheet for Healthcare Providers:  GravelBags.it This test is not yet ap proved or cleared by the [place] FDA and  has been authorized for detection and/or diagnosis of SARS-CoV-2 by FDA under an Emergency Use Authorization (EUA). This EUA will remain  in effect (meaning this test can be used) for the duration of the COVID-19 declaration under Section 564(b)(1) of the Act, 21 U.S.C. section 360bbb-3(b)(1), unless the authorization is terminated or revoked sooner.    Influenza A by PCR NEGATIVE NEGATIVE Final   Influenza B by PCR NEGATIVE NEGATIVE Final  Comment: (NOTE) The Xpert Xpress SARS-CoV-2/FLU/RSV assay is intended as an aid in  the diagnosis of influenza from Nasopharyngeal swab specimens and  should not be used as a sole basis for treatment. Nasal washings and  aspirates are unacceptable for Xpert Xpress SARS-CoV-2/FLU/RSV  testing. Fact Sheet for Patients: PinkCheek.be Fact Sheet for Healthcare Providers: GravelBags.it This test is not yet approved or cleared by the [place] FDA and  has been authorized for detection and/or diagnosis of SARS-CoV-2 by  FDA under an Emergency Use Authorization (EUA). This EUA will remain  in effect (meaning this test can be used) for the duration of the  Covid-19 declaration under Section 564(b)(1) of the  Act, 21  U.S.C. section 360bbb-3(b)(1), unless the authorization is  terminated or revoked. Performed at [facility], [address], [place], [place] [address]          Radiology Studies: No results found.      Scheduled Meds: .  stroke: mapping our early stages of recovery book   Does not apply Once  . ALPRAZolam  0.5 mg Oral QHS  . aspirin  300 mg Rectal Daily   Or  . aspirin  325 mg Oral Daily  . atorvastatin  40 mg Oral q1800  . clopidogrel  75 mg Oral Daily  . insulin aspart  0-5 Units Subcutaneous QHS  . insulin aspart  0-9 Units Subcutaneous TID WC   Continuous Infusions: . [START ON 10/21/2019]  ceFAZolin (ANCEF) IV       LOS: 3 days     Georgette Shell, MD [facility]  If 7PM-7AM, please contact night-coverage [email or web address] Password [facility] 10/20/2019, 11:58 AM

## 2019-10-20 NOTE — Progress Notes (Signed)
  Progress Note    10/20/2019 1:35 PM * No surgery date entered *  Subjective: No overnight issues, she is not aware of her speech is improving or not  Vitals:   10/20/19 0336 10/20/19 0754  BP: (!) 115/49 131/78  Pulse: 79 81  Resp: 18 14  Temp: 98.2 F (36.8 C) 97.8 F (36.6 C)  SpO2: 99% 97%    Physical Exam: Awake alert oriented We will extremities well limitation Grip strength bilaterally 5 out of 5  CBC    Component Value Date/Time   WBC 6.7 10/19/2019 0125   RBC 4.05 10/19/2019 0125   HGB 12.5 10/19/2019 0125   HCT 38.3 10/19/2019 0125   PLT 258 10/19/2019 0125   MCV 94.6 10/19/2019 0125   MCH 30.9 10/19/2019 0125   MCHC 32.6 10/19/2019 0125   RDW 13.1 10/19/2019 0125   LYMPHSABS 2.8 10/16/2019 1903   MONOABS 0.6 10/16/2019 1903   EOSABS 0.1 10/16/2019 1903   BASOSABS 0.0 10/16/2019 1903    BMET    Component Value Date/Time   NA 141 10/19/2019 0125   K 4.3 10/19/2019 0125   CL 106 10/19/2019 0125   CO2 25 10/19/2019 0125   GLUCOSE 114 (H) 10/19/2019 0125   BUN 14 10/19/2019 0125   CREATININE 1.02 (H) 10/19/2019 0125   CALCIUM 9.1 10/19/2019 0125   GFRNONAA 47 (L) 10/19/2019 0125   GFRAA 55 (L) 10/19/2019 0125    INR    Component Value Date/Time   INR 1.0 10/16/2019 1903    No intake or output data in the 24 hours ending 10/20/19 1335   Assessment:  86 y.o. female is here with symptomatic right ICA lesion.  Plan: Plan for right CEA tomorrow in OR. N.p.o. past midnight Continue aspirin, Plavix, statin.  Brandon C. Donzetta Matters, MD [facility] [place] Office: [phone] Pager: [phone]  10/20/2019 1:35 PM

## 2019-10-21 ENCOUNTER — Inpatient Hospital Stay (HOSPITAL_COMMUNITY): Payer: Medicare Other

## 2019-10-21 ENCOUNTER — Encounter (HOSPITAL_COMMUNITY)
Admission: EM | Disposition: A | Discharge status: Home / Self Care | Payer: Self-pay | Source: Home / Self Care | Attending: Internal Medicine

## 2019-10-21 ENCOUNTER — Encounter (HOSPITAL_COMMUNITY): Payer: Self-pay | Admitting: Internal Medicine

## 2019-10-21 DIAGNOSIS — I10 Essential (primary) hypertension: Secondary | ICD-10-CM

## 2019-10-21 HISTORY — PX: PATCH ANGIOPLASTY: SHX6230

## 2019-10-21 HISTORY — PX: ENDARTERECTOMY: SHX5162

## 2019-10-21 LAB — CBC
HCT: 39.6 % (ref 36.0–46.0)
Hemoglobin: 13.1 g/dL (ref 12.0–15.0)
MCH: 30.8 pg (ref 26.0–34.0)
MCHC: 33.1 g/dL (ref 30.0–36.0)
MCV: 93.2 fL (ref 80.0–100.0)
Platelets: 276 10*3/uL (ref 150–400)
RBC: 4.25 MIL/uL (ref 3.87–5.11)
RDW: 12.9 % (ref 11.5–15.5)
WBC: 6.9 10*3/uL (ref 4.0–10.5)
nRBC: 0 % (ref 0.0–0.2)

## 2019-10-21 LAB — MRSA PCR SCREENING: MRSA by PCR: NEGATIVE

## 2019-10-21 LAB — GLUCOSE, CAPILLARY
Glucose-Capillary: 115 mg/dL — ABNORMAL HIGH (ref 70–99)
Glucose-Capillary: 118 mg/dL — ABNORMAL HIGH (ref 70–99)
Glucose-Capillary: 147 mg/dL — ABNORMAL HIGH (ref 70–99)
Glucose-Capillary: 141 mg/dL — ABNORMAL HIGH (ref 70–99)
Glucose-Capillary: 252 mg/dL — ABNORMAL HIGH (ref 70–99)

## 2019-10-21 LAB — POCT ACTIVATED CLOTTING TIME: Activated Clotting Time: 290 seconds

## 2019-10-21 SURGERY — ENDARTERECTOMY, CAROTID
Anesthesia: General | Laterality: Right

## 2019-10-21 MED ORDER — METOCLOPRAMIDE HCL 5 MG/ML IJ SOLN
INTRAMUSCULAR | Status: AC
  Filled 2019-10-21: qty 2

## 2019-10-21 MED ORDER — GLYCOPYRROLATE 0.2 MG/ML IJ SOLN
INTRAMUSCULAR | Status: DC | PRN
  Administered 2019-10-21: .1 mg via INTRAVENOUS

## 2019-10-21 MED ORDER — PROTAMINE SULFATE 10 MG/ML IV SOLN
INTRAVENOUS | Status: DC | PRN
  Administered 2019-10-21: 50 mg via INTRAVENOUS

## 2019-10-21 MED ORDER — ONDANSETRON HCL 4 MG/2ML IJ SOLN
INTRAMUSCULAR | Status: AC
  Filled 2019-10-21: qty 2

## 2019-10-21 MED ORDER — OXYCODONE HCL 5 MG/5ML PO SOLN: 5.0000 mg | Freq: Once | ORAL | Status: DC | PRN

## 2019-10-21 MED ORDER — LIDOCAINE HCL (PF) 1 % IJ SOLN
INTRAMUSCULAR | Status: AC
  Filled 2019-10-21: qty 30

## 2019-10-21 MED ORDER — MORPHINE SULFATE (PF) 2 MG/ML IV SOLN: 2.0000 mg | INTRAVENOUS | Status: DC | PRN

## 2019-10-21 MED ORDER — DOCUSATE SODIUM 100 MG PO CAPS
100.0000 mg | ORAL_CAPSULE | Freq: Every day | ORAL | Status: DC
  Administered 2019-10-22: 100 mg via ORAL
  Filled 2019-10-21: qty 1

## 2019-10-21 MED ORDER — SUGAMMADEX SODIUM 200 MG/2ML IV SOLN
INTRAVENOUS | Status: DC | PRN
  Administered 2019-10-21: 150 mg via INTRAVENOUS

## 2019-10-21 MED ORDER — DEXTRAN 40 IN SALINE 10-0.9 % IV SOLN
INTRAVENOUS | Status: AC
  Filled 2019-10-21: qty 500

## 2019-10-21 MED ORDER — ROCURONIUM BROMIDE 10 MG/ML (PF) SYRINGE
PREFILLED_SYRINGE | INTRAVENOUS | Status: AC
  Filled 2019-10-21: qty 10

## 2019-10-21 MED ORDER — FENTANYL CITRATE (PF) 250 MCG/5ML IJ SOLN
INTRAMUSCULAR | Status: AC
  Filled 2019-10-21: qty 5

## 2019-10-21 MED ORDER — SODIUM CHLORIDE 0.9 % IV SOLN
INTRAVENOUS | Status: DC | PRN
  Administered 2019-10-21: 500 mL

## 2019-10-21 MED ORDER — LIDOCAINE 2% (20 MG/ML) 5 ML SYRINGE
INTRAMUSCULAR | Status: AC
  Filled 2019-10-21: qty 5

## 2019-10-21 MED ORDER — FENTANYL CITRATE (PF) 250 MCG/5ML IJ SOLN
INTRAMUSCULAR | Status: DC | PRN
  Administered 2019-10-21 (×2): 50 ug via INTRAVENOUS

## 2019-10-21 MED ORDER — DEXTRAN 40 IN SALINE 10-0.9 % IV SOLN
INTRAVENOUS | Status: AC | PRN
  Administered 2019-10-21: 500 mL

## 2019-10-21 MED ORDER — ALUM & MAG HYDROXIDE-SIMETH 200-200-20 MG/5ML PO SUSP: 15.0000 mL | ORAL | Status: DC | PRN

## 2019-10-21 MED ORDER — METOCLOPRAMIDE HCL 5 MG/ML IJ SOLN
10.0000 mg | Freq: Once | INTRAMUSCULAR | Status: AC
  Administered 2019-10-21: 10 mg via INTRAVENOUS

## 2019-10-21 MED ORDER — SODIUM CHLORIDE 0.9 % IV SOLN: 500.0000 mL | Freq: Once | INTRAVENOUS | Status: DC | PRN

## 2019-10-21 MED ORDER — ONDANSETRON HCL 4 MG/2ML IJ SOLN
4.0000 mg | Freq: Once | INTRAMUSCULAR | Status: AC | PRN
  Administered 2019-10-21: 4 mg via INTRAVENOUS

## 2019-10-21 MED ORDER — LACTATED RINGERS IV SOLN: INTRAVENOUS | Status: DC | PRN

## 2019-10-21 MED ORDER — PANTOPRAZOLE SODIUM 40 MG PO TBEC
40.0000 mg | DELAYED_RELEASE_TABLET | Freq: Every day | ORAL | Status: DC
  Administered 2019-10-21 – 2019-10-22 (×2): 40 mg via ORAL
  Filled 2019-10-21 (×2): qty 1

## 2019-10-21 MED ORDER — FENTANYL CITRATE (PF) 100 MCG/2ML IJ SOLN
25.0000 ug | INTRAMUSCULAR | Status: DC | PRN
  Administered 2019-10-21: 25 ug via INTRAVENOUS

## 2019-10-21 MED ORDER — OXYCODONE-ACETAMINOPHEN 5-325 MG PO TABS: 1.0000 | ORAL_TABLET | ORAL | Status: DC | PRN

## 2019-10-21 MED ORDER — PHENYLEPHRINE HCL-NACL 10-0.9 MG/250ML-% IV SOLN
INTRAVENOUS | Status: DC | PRN
  Administered 2019-10-21: 50 ug/min via INTRAVENOUS

## 2019-10-21 MED ORDER — MAGNESIUM SULFATE 2 GM/50ML IV SOLN: 2.0000 g | Freq: Every day | INTRAVENOUS | Status: DC | PRN

## 2019-10-21 MED ORDER — LACTATED RINGERS IV SOLN: INTRAVENOUS | Status: DC

## 2019-10-21 MED ORDER — FENTANYL CITRATE (PF) 100 MCG/2ML IJ SOLN
INTRAMUSCULAR | Status: AC
  Filled 2019-10-21: qty 2

## 2019-10-21 MED ORDER — ESMOLOL HCL-SODIUM CHLORIDE 2000 MG/100ML IV SOLN: 25.0000 ug/kg/min | INTRAVENOUS | Status: DC

## 2019-10-21 MED ORDER — METOPROLOL TARTRATE 5 MG/5ML IV SOLN: 2.0000 mg | INTRAVENOUS | Status: DC | PRN

## 2019-10-21 MED ORDER — PHENYLEPHRINE 40 MCG/ML (10ML) SYRINGE FOR IV PUSH (FOR BLOOD PRESSURE SUPPORT)
PREFILLED_SYRINGE | INTRAVENOUS | Status: DC | PRN
  Administered 2019-10-21: 80 ug via INTRAVENOUS
  Administered 2019-10-21: 120 ug via INTRAVENOUS
  Administered 2019-10-21: 80 ug via INTRAVENOUS

## 2019-10-21 MED ORDER — ROCURONIUM BROMIDE 10 MG/ML (PF) SYRINGE
PREFILLED_SYRINGE | INTRAVENOUS | Status: DC | PRN
  Administered 2019-10-21 (×2): 10 mg via INTRAVENOUS
  Administered 2019-10-21: 40 mg via INTRAVENOUS

## 2019-10-21 MED ORDER — ESMOLOL HCL 100 MG/10ML IV SOLN
50.0000 ug/kg/min | Freq: Once | INTRAVENOUS | Status: DC
  Filled 2019-10-21: qty 110

## 2019-10-21 MED ORDER — 0.9 % SODIUM CHLORIDE (POUR BTL) OPTIME
TOPICAL | Status: DC | PRN
  Administered 2019-10-21: 2000 mL

## 2019-10-21 MED ORDER — SODIUM CHLORIDE 0.9 % IV SOLN
INTRAVENOUS | Status: AC
  Filled 2019-10-21: qty 1.2

## 2019-10-21 MED ORDER — ONDANSETRON HCL 4 MG/2ML IJ SOLN: 4.0000 mg | Freq: Four times a day (QID) | INTRAMUSCULAR | Status: DC | PRN

## 2019-10-21 MED ORDER — PHENOL 1.4 % MT LIQD: 1.0000 | OROMUCOSAL | Status: DC | PRN

## 2019-10-21 MED ORDER — HEPARIN SODIUM (PORCINE) 1000 UNIT/ML IJ SOLN
INTRAMUSCULAR | Status: DC | PRN
  Administered 2019-10-21: 8000 [IU] via INTRAVENOUS

## 2019-10-21 MED ORDER — GUAIFENESIN-DM 100-10 MG/5ML PO SYRP: 15.0000 mL | ORAL_SOLUTION | ORAL | Status: DC | PRN

## 2019-10-21 MED ORDER — SODIUM CHLORIDE 0.9 % IV SOLN: INTRAVENOUS | Status: DC

## 2019-10-21 MED ORDER — HEMOSTATIC AGENTS (NO CHARGE) OPTIME
TOPICAL | Status: DC | PRN
  Administered 2019-10-21: 1 via TOPICAL

## 2019-10-21 MED ORDER — PROPOFOL 10 MG/ML IV BOLUS
INTRAVENOUS | Status: DC | PRN
  Administered 2019-10-21: 90 mg via INTRAVENOUS

## 2019-10-21 MED ORDER — LIDOCAINE 2% (20 MG/ML) 5 ML SYRINGE
INTRAMUSCULAR | Status: DC | PRN
  Administered 2019-10-21: 75 mg via INTRAVENOUS
  Administered 2019-10-21: 25 mg via INTRAVENOUS

## 2019-10-21 MED ORDER — DEXAMETHASONE SODIUM PHOSPHATE 10 MG/ML IJ SOLN
INTRAMUSCULAR | Status: DC | PRN
  Administered 2019-10-21: 4 mg via INTRAVENOUS

## 2019-10-21 MED ORDER — OXYCODONE HCL 5 MG PO TABS: 5.0000 mg | ORAL_TABLET | Freq: Once | ORAL | Status: DC | PRN

## 2019-10-21 MED ORDER — ONDANSETRON HCL 4 MG/2ML IJ SOLN
INTRAMUSCULAR | Status: DC | PRN
  Administered 2019-10-21: 4 mg via INTRAVENOUS

## 2019-10-21 MED ORDER — LABETALOL HCL 5 MG/ML IV SOLN: 10.0000 mg | INTRAVENOUS | Status: DC | PRN

## 2019-10-21 MED ORDER — ESMOLOL HCL 100 MG/10ML IV SOLN
50.0000 ug/kg/min | Freq: Once | INTRAVENOUS | Status: DC
  Filled 2019-10-21: qty 10

## 2019-10-21 MED ORDER — POTASSIUM CHLORIDE CRYS ER 20 MEQ PO TBCR: 20.0000 meq | EXTENDED_RELEASE_TABLET | Freq: Every day | ORAL | Status: DC | PRN

## 2019-10-21 MED ORDER — DEXAMETHASONE SODIUM PHOSPHATE 10 MG/ML IJ SOLN
INTRAMUSCULAR | Status: AC
  Filled 2019-10-21: qty 1

## 2019-10-21 MED ORDER — HYDRALAZINE HCL 20 MG/ML IJ SOLN: 5.0000 mg | INTRAMUSCULAR | Status: DC | PRN

## 2019-10-21 MED ORDER — CEFAZOLIN SODIUM-DEXTROSE 2-4 GM/100ML-% IV SOLN
2.0000 g | Freq: Three times a day (TID) | INTRAVENOUS | Status: AC
  Administered 2019-10-21 – 2019-10-22 (×2): 2 g via INTRAVENOUS
  Filled 2019-10-21 (×2): qty 100

## 2019-10-21 MED ORDER — PHENYLEPHRINE 40 MCG/ML (10ML) SYRINGE FOR IV PUSH (FOR BLOOD PRESSURE SUPPORT)
PREFILLED_SYRINGE | INTRAVENOUS | Status: AC
  Filled 2019-10-21: qty 30

## 2019-10-21 SURGICAL SUPPLY — 52 items
ADH SKN CLS APL DERMABOND .7 (GAUZE/BANDAGES/DRESSINGS) ×1
ADPR TBG 2 MALE LL ART (MISCELLANEOUS)
BAG DECANTER FOR FLEXI CONT (MISCELLANEOUS) ×1 IMPLANT
CANISTER SUCT 3000ML PPV (MISCELLANEOUS) ×2 IMPLANT
CATH ROBINSON RED A/P 18FR (CATHETERS) ×2 IMPLANT
CLIP VESOCCLUDE MED 24/CT (CLIP) ×2 IMPLANT
CLIP VESOCCLUDE SM WIDE 24/CT (CLIP) ×2 IMPLANT
COVER PROBE W GEL 5X96 (DRAPES) ×1 IMPLANT
DERMABOND ADVANCED (GAUZE/BANDAGES/DRESSINGS) ×1
DERMABOND ADVANCED .7 DNX12 (GAUZE/BANDAGES/DRESSINGS) ×1 IMPLANT
DRAIN CHANNEL 15F RND FF W/TCR (WOUND CARE) IMPLANT
ELECT REM PT RETURN 9FT ADLT (ELECTROSURGICAL) ×2
ELECTRODE REM PT RTRN 9FT ADLT (ELECTROSURGICAL) ×1 IMPLANT
EVACUATOR SILICONE 100CC (DRAIN) IMPLANT
GLOVE BIO SURGEON STRL SZ7.5 (GLOVE) ×2 IMPLANT
GLOVE BIOGEL PI IND STRL 6.5 (GLOVE) IMPLANT
GLOVE BIOGEL PI INDICATOR 6.5 (GLOVE) ×2
GLOVE ECLIPSE 6.5 STRL STRAW (GLOVE) ×2 IMPLANT
GLOVE SURG SS PI 6.5 STRL IVOR (GLOVE) ×2 IMPLANT
GOWN STRL REUS W/ TWL LRG LVL3 (GOWN DISPOSABLE) ×2 IMPLANT
GOWN STRL REUS W/ TWL XL LVL3 (GOWN DISPOSABLE) ×1 IMPLANT
GOWN STRL REUS W/TWL LRG LVL3 (GOWN DISPOSABLE) ×8
GOWN STRL REUS W/TWL XL LVL3 (GOWN DISPOSABLE) ×2
HEMOSTAT SNOW SURGICEL 2X4 (HEMOSTASIS) ×1 IMPLANT
INSERT FOGARTY SM (MISCELLANEOUS) IMPLANT
IV ADAPTER SYR DOUBLE MALE LL (MISCELLANEOUS) IMPLANT
KIT BASIN OR (CUSTOM PROCEDURE TRAY) ×2 IMPLANT
KIT SHUNT ARGYLE CAROTID ART 6 (VASCULAR PRODUCTS) ×2 IMPLANT
KIT TURNOVER KIT B (KITS) ×2 IMPLANT
NDL HYPO 25GX1X1/2 BEV (NEEDLE) IMPLANT
NDL SPNL 20GX3.5 QUINCKE YW (NEEDLE) IMPLANT
NEEDLE HYPO 25GX1X1/2 BEV (NEEDLE) IMPLANT
NEEDLE SPNL 20GX3.5 QUINCKE YW (NEEDLE) IMPLANT
NS IRRIG 1000ML POUR BTL (IV SOLUTION) ×5 IMPLANT
PACK CAROTID (CUSTOM PROCEDURE TRAY) ×2 IMPLANT
PAD ARMBOARD 7.5X6 YLW CONV (MISCELLANEOUS) ×4 IMPLANT
PATCH VASC XENOSURE 1CMX6CM (Vascular Products) ×2 IMPLANT
PATCH VASC XENOSURE 1X6 (Vascular Products) IMPLANT
POSITIONER HEAD DONUT 9IN (MISCELLANEOUS) ×2 IMPLANT
STOPCOCK 4 WAY LG BORE MALE ST (IV SETS) IMPLANT
SUT ETHILON 3 0 PS 1 (SUTURE) IMPLANT
SUT MNCRL AB 4-0 PS2 18 (SUTURE) ×2 IMPLANT
SUT PROLENE 6 0 BV (SUTURE) ×5 IMPLANT
SUT SILK 3 0 (SUTURE)
SUT SILK 3-0 18XBRD TIE 12 (SUTURE) IMPLANT
SUT VIC AB 3-0 SH 27 (SUTURE) ×2
SUT VIC AB 3-0 SH 27X BRD (SUTURE) ×1 IMPLANT
SYR 20ML LL LF (SYRINGE) ×1 IMPLANT
SYR CONTROL 10ML LL (SYRINGE) IMPLANT
TOWEL GREEN STERILE (TOWEL DISPOSABLE) ×2 IMPLANT
TUBING ART PRESS 48 MALE/FEM (TUBING) IMPLANT
WATER STERILE IRR 1000ML POUR (IV SOLUTION) ×2 IMPLANT

## 2019-10-21 NOTE — Progress Notes (Signed)
  Progress Note    10/21/2019 9:41 AM Day of Surgery  Subjective:  No overnight issues   Vitals:   10/21/19 0513 10/21/19 0825  BP: (!) 100/58 134/76  Pulse: 99 63  Resp: 18 20  Temp: 98 F (36.7 C) 98 F (36.7 C)  SpO2: 96% 99%    Physical Exam: [email or web address] Non labored respirations Moving all extremities without limitation  CBC    Component Value Date/Time   WBC 6.9 10/21/2019 0537   RBC 4.25 10/21/2019 0537   HGB 13.1 10/21/2019 0537   HCT 39.6 10/21/2019 0537   PLT 276 10/21/2019 0537   MCV 93.2 10/21/2019 0537   MCH 30.8 10/21/2019 0537   MCHC 33.1 10/21/2019 0537   RDW 12.9 10/21/2019 0537   LYMPHSABS 2.8 10/16/2019 1903   MONOABS 0.6 10/16/2019 1903   EOSABS 0.1 10/16/2019 1903   BASOSABS 0.0 10/16/2019 1903    BMET    Component Value Date/Time   NA 141 10/19/2019 0125   K 4.3 10/19/2019 0125   CL 106 10/19/2019 0125   CO2 25 10/19/2019 0125   GLUCOSE 114 (H) 10/19/2019 0125   BUN 14 10/19/2019 0125   CREATININE 1.02 (H) 10/19/2019 0125   CALCIUM 9.1 10/19/2019 0125   GFRNONAA 47 (L) 10/19/2019 0125   GFRAA 55 (L) 10/19/2019 0125    INR    Component Value Date/Time   INR 1.0 10/16/2019 1903     Intake/Output Summary (Last 24 hours) at 10/21/2019 0941 Last data filed at 10/20/2019 1800 Gross per 24 hour  Intake 320 ml  Output --  Net 320 ml     Assessment:  86 y.o. female is here with right MCA stroke  Plan: Right CEA today in [place]. Donzetta Matters, MD [facility] [place] Office: [phone] Pager: [phone]  10/21/2019 9:41 AM

## 2019-10-21 NOTE — Op Note (Signed)
Patient name: Denise Parrish MRN: [id number] DOB: 04-13-1923 Sex: female  10/21/2019 Pre-operative Diagnosis: symptomatic right ICA stenosis Post-operative diagnosis:  Same Surgeon:  Erlene Quan C. Donzetta Matters, MD Assistant: Risa Grill, PA Procedure Performed: Right carotid endarterectomy with bovine pericardial patch angioplasty  Indications: 86 year old female with right MCA distribution stroke and high-grade stenosis of the right ICA.  She is now indicated for carotid endarterectomy.  Findings: There was a heavily calcified carotid bifurcation extending up into the ICA.  Lesion was quite high the digastric muscle was divided as was the ansa cervicalis for better exposure.  After endarterectomy patch angioplasty there was good signal with flow throughout diastole in the ICA by Doppler.  Patient was neurologically intact upon awakening from anesthesia.   Procedure:  The patient was identified in the holding area and taken to the operating where she is placed upon operative when general anesthesia induced.  She was sterilely prepped draped in the right neck and chest in usual fashion antibiotics were administered a timeout was called.  An incision was made along the anterior border the sternocleidomastoid.  We dissected down through the platysma subcutaneous tissue.  I dissected down identified the common carotid artery.  This was actually quite deep.  We placed umbilical tape around this patient was fully heparinized and ACT returned approximately 290.  We dissected the prior.  The remaining veins were divided between clips and ties.  We did identify her hypoglossal this was quite low relative to her bifurcation.  We divided several more veins we also had some lymph tissue that was retracted laterally.  We identified our external carotid artery.  We placed a vessel loop around this.  There is a large branch of the superior thyroidal that we did ligated for better exposure.  As we dissected higher on the  ICA we elected to divide the digastric with cautery.  The ansa was then divided between clips.  This allowed exposure higher where the ICA appeared normal.  A vessel loop was placed around the ICA quite high.  I 10 French shunt was then prepared.  The ICA was clamped followed by the CCA and ECA.  The vessel was opened longitudinally from the common carotid onto the internal carotid arteries.  Shunt was placed distally allowed to backbleed and then placed proximally.  Flow was confirmed with Doppler.  We proceeded with endarterectomy.  Distally we did have good feathering.  The lesion was quite friable and ulcerated.  I did remove all in 1 piece.  We then cleaned up the edges.  The ICA was irrigated with dextran and heparinized saline.  A bovine pericardial patch was prepared and sewn in place with 6-0 Prolene suture.  Prior to completing her suture line I removed the shunt.  I allowed backbleeding from the ICA followed by the ECA in an antegrade bleeding from the CCA all with the other vessels clamped.  I then thoroughly irrigated again with heparinized saline and dextran.  I completed my suture line.  I remove the ECA clamp followed by CCA clamp.  I did have 2 areas that needed to be repaired with interrupted 6-0 Prolene suture.  The ICA clamp was then removed.  The signal in the ICA was low resistance.  50 mg of protamine was administered.  We obtain hemostasis and irrigated the wound thoroughly.  The platysma was closed followed by the skin and Dermabond was placed at that level.  She was then awake from anesthesia she was noted be  neurologically intact was transferred to the recovery room in stable condition.  EBL: 100 cc    Shashana Fullington C. Donzetta Matters, MD [facility] [place] Office: [phone] Pager: [phone]

## 2019-10-21 NOTE — Progress Notes (Signed)
PROGRESS NOTE    Denise Parrish  FYB:[id number] DOB: [birth year] DOA: 10/16/2019 PCP: [name], MD    Brief Narrative: 86 year old female with type 2 diabetes and hypertension admitted with slurred speech and left facial droop which has been resolved. However she is found to have 75% right carotid occlusion she was transferred to [facility] for vascular surgery consult. She is scheduled to have a right CEA this week.  10/21/2019-patient seen prior to surgery she was anxious to have the surgery done she was awake alert moving all extremities with clear speech  Assessment & Plan:   Principal Problem:   Stroke [facility]) Active Problems:   TIA (transient ischemic attack)   Slurred speech   Facial droop   Essential hypertension   Diabetes ([place])   CVA (cerebral vascular accident) ([place])   #1 right MCA distribution stroke with left facial droop, and slurred speech which has been resolved. Echo normal ejection fraction grade 1 diastolic dysfunction LDL is 98 Hemoglobin A1c 6.1 She was seen by PT OT and speech therapy with no further recommendations on discharge Continue aspirin and Plavix and Lipitor.  #2 right carotid stenosis for CEA 10/21/2019.  Status post right CEA today   #3 essential hypertension blood pressure 130/66 on metoprolol 25 twice daily at home  #4 anxiety continue Xanax  #5 type 2 diabetes-continue SSI she takes Amaryl at home which is on holD CBG (last 3)  Recent Labs    10/21/19 0617 10/21/19 0922 10/21/19 1326  GLUCAP 115* 118* 147*   Per 6 GERD continue Prilosec  Estimated body mass index is 27.22 kg/m as calculated from the following:   Height as of this encounter: 5' 5"  (1.651 m).   Weight as of this encounter: 74.2 kg.  DVT prophylaxis:SCD Code Status:Full code  family Communication:dw linda daughter Disposition Plan:.  Patient came from home plan is to send her back home.  She just had right CEA today likely will be  discharged home in a.m.  Consultants:  Neurology and vascular surgery  Procedures:None Antimicrobials:None   Subjective:  She is resting in bed she has no complaints anxious to have the surgery done today Objective: Vitals:   10/21/19 1322 10/21/19 1338 10/21/19 1353 10/21/19 1408  BP: 130/83 (!) 149/77 124/66 (!) 149/63  Pulse: 80 90 85 79  Resp: 16 17 14 15   Temp:      TempSrc:      SpO2: 93% 97% 96% 98%  Weight:      Height:        Intake/Output Summary (Last 24 hours) at 10/21/2019 1433 Last data filed at 10/21/2019 1258 Gross per 24 hour  Intake 1120 ml  Output 100 ml  Net 1020 ml   Filed Weights   10/16/19 1944 10/16/19 2225 10/21/19 0918  Weight: 75.6 kg 74.2 kg 74.2 kg    Examination:  General exam: Appears calm and comfortable  Respiratory system: Clear to auscultation. Respiratory effort normal. Cardiovascular system: S1 & S2 heard, RRR. No JVD, murmurs, rubs, gallops or clicks. No pedal edema. Gastrointestinal system: Abdomen is nondistended, soft and nontender. No organomegaly or masses felt. Normal bowel sounds heard. Central nervous system: Alert and oriented. No focal neurological deficits. Extremities: Symmetric 5 x 5 power. Skin: No rashes, lesions or ulcers Psychiatry: Judgement and insight appear normal. Mood & affect appropriate.     Data Reviewed: I have personally reviewed following labs and imaging studies  CBC: Recent Labs  Lab 10/16/19 1903 10/17/19  0501 10/19/19 0125 10/21/19 0537  WBC 6.4 6.2 6.7 6.9  NEUTROABS 2.8  --   --   --   HGB 13.8 12.0 12.5 13.1  HCT 42.7 36.4 38.3 39.6  MCV 95.5 95.5 94.6 93.2  PLT 295 249 258 675   Basic Metabolic Panel: Recent Labs  Lab 10/16/19 1903 10/17/19 0501 10/19/19 0125  NA 139 140 141  K 4.2 3.7 4.3  CL 103 105 106  CO2 28 25 25   GLUCOSE 122* 108* 114*  BUN 17 17 14   CREATININE 1.12* 1.03* 1.02*  CALCIUM 9.4 8.8* 9.1   GFR: Estimated Creatinine Clearance: 34 mL/min (A)  (by C-G formula based on SCr of 1.02 mg/dL (H)). Liver Function Tests: Recent Labs  Lab 10/16/19 1903 10/17/19 0501  AST 24 19  ALT 20 17  ALKPHOS 66 52  BILITOT 0.8 0.6  PROT 7.4 5.9*  ALBUMIN 4.1 3.4*   No results for input(s): LIPASE, AMYLASE in the last 168 hours. No results for input(s): AMMONIA in the last 168 hours. Coagulation Profile: Recent Labs  Lab 10/16/19 1903  INR 1.0   Cardiac Enzymes: No results for input(s): CKTOTAL, CKMB, CKMBINDEX, TROPONINI in the last 168 hours. BNP (last 3 results) No results for input(s): PROBNP in the last 8760 hours. HbA1C: No results for input(s): HGBA1C in the last 72 hours. CBG: Recent Labs  Lab 10/20/19 1617 10/20/19 2059 10/21/19 0617 10/21/19 0922 10/21/19 1326  GLUCAP 99 103* 115* 118* 147*   Lipid Profile: No results for input(s): CHOL, HDL, LDLCALC, TRIG, CHOLHDL, LDLDIRECT in the last 72 hours. Thyroid Function Tests: No results for input(s): TSH, T4TOTAL, FREET4, T3FREE, THYROIDAB in the last 72 hours. Anemia Panel: No results for input(s): VITAMINB12, FOLATE, FERRITIN, TIBC, IRON, RETICCTPCT in the last 72 hours. Sepsis Labs: No results for input(s): PROCALCITON, LATICACIDVEN in the last 168 hours.  Recent Results (from the past 240 hour(s))  Respiratory Panel by RT PCR (Flu A&B, Covid) - Nasopharyngeal Swab     Status: None   Collection Time: 10/16/19  7:06 PM   Specimen: Nasopharyngeal Swab  Result Value Ref Range Status   SARS Coronavirus 2 by RT PCR NEGATIVE NEGATIVE Final    Comment: (NOTE) SARS-CoV-2 target nucleic acids are NOT DETECTED. The SARS-CoV-2 RNA is generally detectable in upper respiratoy specimens during the acute phase of infection. The lowest concentration of SARS-CoV-2 viral copies this assay can detect is 131 copies/mL. A negative result does not preclude SARS-Cov-2 infection and should not be used as the sole basis for treatment or other patient management decisions. A negative  result may occur with  improper specimen collection/handling, submission of specimen other than nasopharyngeal swab, presence of viral mutation(s) within the areas targeted by this assay, and inadequate number of viral copies (<131 copies/mL). A negative result must be combined with clinical observations, patient history, and epidemiological information. The expected result is Negative. Fact Sheet for Patients:  PinkCheek.be Fact Sheet for Healthcare Providers:  GravelBags.it This test is not yet ap proved or cleared by the [place] FDA and  has been authorized for detection and/or diagnosis of SARS-CoV-2 by FDA under an Emergency Use Authorization (EUA). This EUA will remain  in effect (meaning this test can be used) for the duration of the COVID-19 declaration under Section 564(b)(1) of the Act, 21 U.S.C. section 360bbb-3(b)(1), unless the authorization is terminated or revoked sooner.    Influenza A by PCR NEGATIVE NEGATIVE Final   Influenza B by PCR NEGATIVE  NEGATIVE Final    Comment: (NOTE) The Xpert Xpress SARS-CoV-2/FLU/RSV assay is intended as an aid in  the diagnosis of influenza from Nasopharyngeal swab specimens and  should not be used as a sole basis for treatment. Nasal washings and  aspirates are unacceptable for Xpert Xpress SARS-CoV-2/FLU/RSV  testing. Fact Sheet for Patients: PinkCheek.be Fact Sheet for Healthcare Providers: GravelBags.it This test is not yet approved or cleared by the [place] FDA and  has been authorized for detection and/or diagnosis of SARS-CoV-2 by  FDA under an Emergency Use Authorization (EUA). This EUA will remain  in effect (meaning this test can be used) for the duration of the  Covid-19 declaration under Section 564(b)(1) of the Act, 21  U.S.C. section 360bbb-3(b)(1), unless the authorization is  terminated or  revoked. Performed at [facility], [address] [place], [place] [address]   MRSA PCR Screening     Status: None   Collection Time: 10/21/19  4:31 AM   Specimen: Nasal Mucosa; Nasopharyngeal  Result Value Ref Range Status   MRSA by PCR NEGATIVE NEGATIVE Final    Comment:        The GeneXpert MRSA Assay (FDA approved for NASAL specimens only), is one component of a comprehensive MRSA colonization surveillance program. It is not intended to diagnose MRSA infection nor to guide or monitor treatment for MRSA infections. Performed at [facility], [place] [address], [place], [place] [address]          Radiology Studies: No results found.      Scheduled Meds: . [MAR Hold]  stroke: mapping our early stages of recovery book   Does not apply Once  . [MAR Hold] ALPRAZolam  0.5 mg Oral QHS  . [MAR Hold] aspirin  300 mg Rectal Daily   Or  . [MAR Hold] aspirin  325 mg Oral Daily  . [MAR Hold] atorvastatin  40 mg Oral q1800  . [MAR Hold] clopidogrel  75 mg Oral Daily  . esmolol  50 mcg/kg/min Intravenous Once  . esmolol  50 mcg/kg/min Intravenous Once  . esmolol  50 mcg/kg/min Intravenous Once  . fentaNYL      . [MAR Hold] insulin aspart  0-5 Units Subcutaneous QHS  . [MAR Hold] insulin aspart  0-9 Units Subcutaneous TID WC  . metoCLOPramide      . ondansetron       Continuous Infusions: . lactated ringers Stopped (10/21/19 1258)     LOS: 4 days     Georgette Shell, MD [facility]  If 7PM-7AM, please contact night-coverage [email or web address] Password [facility] 10/21/2019, 2:33 PM

## 2019-10-21 NOTE — Progress Notes (Signed)
Pt ambulated to the bathroom and back with front wheeled walker.  Tolerated ambulation well.  Will continue to monitor.

## 2019-10-21 NOTE — Anesthesia Preprocedure Evaluation (Addendum)
Anesthesia Evaluation  Patient identified by MRN, date of birth, ID band Patient awake    Reviewed: Allergy & Precautions, NPO status , Patient's Chart, lab work & pertinent test results  Airway Mallampati: II  TM Distance: >3 FB     Dental  (+) Edentulous Upper, Edentulous Lower   Pulmonary    breath sounds clear to auscultation       Cardiovascular hypertension,  Rhythm:Regular Rate:Normal     Neuro/Psych    GI/Hepatic   Endo/Other  diabetes  Renal/GU      Musculoskeletal   Abdominal   Peds  Hematology   Anesthesia Other Findings   Reproductive/Obstetrics                            Anesthesia Physical Anesthesia Plan  ASA: III  Anesthesia Plan:    Post-op Pain Management:  Regional for Post-op pain   Induction:   PONV Risk Score and Plan:   Airway Management Planned: Oral ETT  Additional Equipment: Arterial line  Intra-op Plan:   Post-operative Plan:   Informed Consent: I have reviewed the patients History and Physical, chart, labs and discussed the procedure including the risks, benefits and alternatives for the proposed anesthesia with the patient or authorized representative who has indicated his/her understanding and acceptance.       Plan Discussed with:   Anesthesia Plan Comments:         Anesthesia Quick Evaluation

## 2019-10-21 NOTE — Anesthesia Postprocedure Evaluation (Signed)
Anesthesia Post Note  Patient: Denise Parrish  Procedure(s) Performed: ENDARTERECTOMY CAROTID RIGHT (Right ) PATCH ANGIOPLASTY USING XENOSURE BIOLOGIC PATCH (Right )     Patient location during evaluation: PACU Anesthesia Type: General Level of consciousness: awake and alert Pain management: pain level controlled Vital Signs Assessment: post-procedure vital signs reviewed and stable Respiratory status: spontaneous breathing, nonlabored ventilation, respiratory function stable and patient connected to nasal cannula oxygen Cardiovascular status: blood pressure returned to baseline and stable Postop Assessment: no apparent nausea or vomiting Anesthetic complications: no    Last Vitals:  Vitals:   10/21/19 1456 10/21/19 1506  BP: 110/67 110/67  Pulse: 90   Resp: 16   Temp: 36.6 C   SpO2: 92%     Last Pain:  Vitals:   10/21/19 1456  TempSrc: Oral  PainSc: Asleep                 Susie Ehresman COKER

## 2019-10-21 NOTE — Progress Notes (Signed)
Patient arrived from PACU to 4E room 3 after right carotid endarterectomy w/Cain.  Telemetry monitor applied and CCMD notified.  CHG bath completed.  Frequent vitals cycling.  Q.1 hour neurochecks ongoing.  Will continue to monitor.

## 2019-10-21 NOTE — Transfer of Care (Signed)
Immediate Anesthesia Transfer of Care Note  Patient: Denise Parrish  Procedure(s) Performed: ENDARTERECTOMY CAROTID RIGHT (Right ) PATCH ANGIOPLASTY USING XENOSURE BIOLOGIC PATCH (Right )  Patient Location: PACU  Anesthesia Type:General  Level of Consciousness: awake  Airway & Oxygen Therapy: Patient Spontanous Breathing and Patient connected to face mask oxygen  Post-op Assessment: Report given to RN and Post -op Vital signs reviewed and stable  Post vital signs: Reviewed and stable  Last Vitals:  Vitals Value Taken Time  BP 130/64 10/21/19 1254  Temp    Pulse 78 10/21/19 1258  Resp 17 10/21/19 1258  SpO2 94 % 10/21/19 1258  Vitals shown include unvalidated device data.  Last Pain:  Vitals:   10/21/19 0918  TempSrc:   PainSc: 0-No pain         Complications: No apparent anesthesia complications

## 2019-10-21 NOTE — Anesthesia Procedure Notes (Signed)
Arterial Line Insertion Start/End2/10/2019 10:10 AM, 10/21/2019 10:14 AM Performed by: Inda Coke, CRNA, CRNA  Preanesthetic checklist: patient identified, IV checked, site marked, risks and benefits discussed, surgical consent, monitors and equipment checked, pre-op evaluation, timeout performed and anesthesia consent Lidocaine 1% used for infiltration Right, radial was placed Catheter size: 20 G Hand hygiene performed  and maximum sterile barriers used  Allen's test indicative of satisfactory collateral circulation Attempts: 1 Procedure performed without using ultrasound guided technique. Ultrasound Notes:anatomy identified Following insertion, dressing applied and Biopatch. Post procedure assessment: normal  Patient tolerated the procedure well with no immediate complications.

## 2019-10-21 NOTE — Anesthesia Procedure Notes (Signed)
Procedure Name: Intubation Date/Time: 10/21/2019 10:38 AM Performed by: Milford Cage, CRNA Pre-anesthesia Checklist: Patient identified, Emergency Drugs available, Suction available and Patient being monitored Patient Re-evaluated:Patient Re-evaluated prior to induction Oxygen Delivery Method: Circle System Utilized Preoxygenation: Pre-oxygenation with 100% oxygen Induction Type: IV induction Ventilation: Mask ventilation without difficulty Laryngoscope Size: Miller and 2 Grade View: Grade I Tube type: Oral Tube size: 7.0 mm Number of attempts: 1 Airway Equipment and Method: Stylet Placement Confirmation: ETT inserted through vocal cords under direct vision,  positive ETCO2 and breath sounds checked- equal and bilateral Tube secured with: Tape Dental Injury: Teeth and Oropharynx as per pre-operative assessment  Comments: Performed by Lyndle Herrlich

## 2019-10-22 ENCOUNTER — Encounter: Payer: Self-pay | Admitting: *Deleted

## 2019-10-22 LAB — CBC
HCT: 39.8 % (ref 36.0–46.0)
Hemoglobin: 12.8 g/dL (ref 12.0–15.0)
MCH: 30.8 pg (ref 26.0–34.0)
MCHC: 32.2 g/dL (ref 30.0–36.0)
MCV: 95.7 fL (ref 80.0–100.0)
Platelets: 318 10*3/uL (ref 150–400)
RBC: 4.16 MIL/uL (ref 3.87–5.11)
RDW: 12.9 % (ref 11.5–15.5)
WBC: 19.4 10*3/uL — ABNORMAL HIGH (ref 4.0–10.5)
nRBC: 0 % (ref 0.0–0.2)

## 2019-10-22 LAB — BASIC METABOLIC PANEL
Anion gap: 11 (ref 5–15)
BUN: 19 mg/dL (ref 8–23)
CO2: 26 mmol/L (ref 22–32)
Calcium: 9.4 mg/dL (ref 8.9–10.3)
Chloride: 105 mmol/L (ref 98–111)
Creatinine, Ser: 1.28 mg/dL — ABNORMAL HIGH (ref 0.44–1.00)
GFR calc Af Amer: 41 mL/min — ABNORMAL LOW (ref >60)
GFR calc non Af Amer: 36 mL/min — ABNORMAL LOW (ref >60)
Glucose, Bld: 173 mg/dL — ABNORMAL HIGH (ref 70–99)
Potassium: 4.1 mmol/L (ref 3.5–5.1)
Sodium: 142 mmol/L (ref 135–145)

## 2019-10-22 LAB — GLUCOSE, CAPILLARY: Glucose-Capillary: 125 mg/dL — ABNORMAL HIGH (ref 70–99)

## 2019-10-22 MED ORDER — ATORVASTATIN CALCIUM 40 MG PO TABS: 40.0000 mg | ORAL_TABLET | Freq: Every day | ORAL | 2 refills | Status: DC

## 2019-10-22 MED ORDER — CLOPIDOGREL BISULFATE 75 MG PO TABS: 75.0000 mg | ORAL_TABLET | Freq: Every day | ORAL | 2 refills | Status: DC

## 2019-10-22 MED ORDER — ASPIRIN 325 MG PO TABS: 325.0000 mg | ORAL_TABLET | Freq: Every day | ORAL | Status: DC

## 2019-10-22 MED ORDER — PANTOPRAZOLE SODIUM 40 MG PO TBEC: 40.0000 mg | DELAYED_RELEASE_TABLET | Freq: Every day | ORAL | 2 refills | Status: DC

## 2019-10-22 NOTE — Progress Notes (Addendum)
Progress Note    10/22/2019 8:37 AM 1 Day Post-Op  Subjective:  C/o sore throat but no trouble swallowing.    Afebrile HR 90's-140's  754'G-920'F systolic 00% RA  Vitals:   10/22/19 0400 10/22/19 0814  BP: 111/60 108/64  Pulse: 91 95  Resp: 13 18  Temp: 98.7 F (37.1 C) 97.9 F (36.6 C)  SpO2: 94%      Physical Exam: Neuro:  In tact; moving all extremities equally; no difficulty swallowing Lungs:  Non labored Incision:  Clean and dry  CBC    Component Value Date/Time   WBC 6.9 10/21/2019 0537   RBC 4.25 10/21/2019 0537   HGB 13.1 10/21/2019 0537   HCT 39.6 10/21/2019 0537   PLT 276 10/21/2019 0537   MCV 93.2 10/21/2019 0537   MCH 30.8 10/21/2019 0537   MCHC 33.1 10/21/2019 0537   RDW 12.9 10/21/2019 0537   LYMPHSABS 2.8 10/16/2019 1903   MONOABS 0.6 10/16/2019 1903   EOSABS 0.1 10/16/2019 1903   BASOSABS 0.0 10/16/2019 1903    BMET    Component Value Date/Time   NA 141 10/19/2019 0125   K 4.3 10/19/2019 0125   CL 106 10/19/2019 0125   CO2 25 10/19/2019 0125   GLUCOSE 114 (H) 10/19/2019 0125   BUN 14 10/19/2019 0125   CREATININE 1.02 (H) 10/19/2019 0125   CALCIUM 9.1 10/19/2019 0125   GFRNONAA 47 (L) 10/19/2019 0125   GFRAA 55 (L) 10/19/2019 0125     Intake/Output Summary (Last 24 hours) at 10/22/2019 0837 Last data filed at 10/22/2019 0500 Gross per 24 hour  Intake 2078.74 ml  Output 100 ml  Net 1978.74 ml     Assessment/Plan:  This is a 86 y.o. female who is s/p right CEA 1 Day Post-Op  -pt is doing well this am. -pt neuro exam is in tact -pt has not ambulated in the hallway but has in the room -pt has voided -pt ok to discharge from vascular standpoint.  Continue statin/asa. -f/u with Dr. Donzetta Matters in 2 weeks.  The office will call pt to arrange this appt.   Leontine Locket, PA-C [facility] [phone]   --- For [facility] use --- Instructions: Press F2 to tab through selections.  Delete question if not applicable.    Modified Rankin score at D/C (0-6): Rankin Score=0  IV medication needed for:  1. Hypertension: No 2. Hypotension: No  Post-op Complications: No  1. Post-op CVA or TIA: No  If yes: Event classification (right eye, left eye, right cortical, left cortical, verterobasilar, other): n/a  If yes: Timing of event (intra-op, <6 hrs post-op, >=6 hrs post-op, unknown): n/a  2. CN injury: No  If yes: CN n/a injuried   3. Myocardial infarction: No  If yes: Dx by (EKG or clinical, Troponin): n/a  4.  CHF: No  5.  Dysrhythmia (new): No  6. Wound infection: No  7. Reperfusion symptoms: No  8. Return to OR: No  If yes: return to OR for (bleeding, neurologic, other CEA incision, other): n/a  Discharge medications: Statin use:  Yes ASA use:  Yes Beta blocker use:  Yes ACE-Inhibitor use:  Yes P2Y12 Antagonist use: [ ]  None, [ x] Plavix, [ ]  Plasugrel, [ ]  Ticlopinine, [ ]  Ticagrelor, [ ]  Other, [ ]  No for medical reason, [ ]  Non-compliant, [ ]  Not-indicated Anti-coagulant use:  [ x] None, [ ]  Warfarin, [ ]  Rivaroxaban, [ ]  Dabigatran, [ ]  Other, [ ]  No for  medical reason, [ ]  Non-compliant, [ ]  Not-indicated   I agree with the above.  I have seen and examined the patient.  SHe is POD#1, s/p R CEA>  He r incision looks fine.  She is neurologically intact.  She is stable ffor discharge.  Annamarie Major

## 2019-10-22 NOTE — Progress Notes (Signed)
Orders received to discharge patient.  Telemetry monitor removed and CCMD notified.  PIV access removed.  Discharge instructions, follow up, medications and instructions for their use discussed with patient.

## 2019-10-22 NOTE — Evaluation (Signed)
Physical Therapy Evaluation Patient Details Name: Denise Parrish MRN: [id number] DOB: 06/30/1923 Today's Date: 10/22/2019   History of Present Illness  86 year old female with type 2 diabetes and hypertension admitted with slurred speech and left facial droop which has been resolved, R MCA stroke.  However she is found to have 75% right carotid occlusion she was transferred [facility] for vascular surgery consult. PT underwent R CEA on 10/21/2019.  Clinical Impression  Pt presents to PT with deficits in strength, power, balance, gait, functional mobility, however is not far from her baseline per patient and daughter report. Pt is able to perform all mobility required in the home without physical assistance at this time. Pt will benefit from continued acute PT POC to improve dynamic balance, gait, and to reinforce DME use. PT recommends no PT or DME follow-up. PT recommends initial 24-7 supervision from family to ensure a safe transition back to the home setting.    Follow Up Recommendations No PT follow up    Equipment Recommendations  None recommended by PT(pt owns necessary DME)    Recommendations for Other Services       Precautions / Restrictions Precautions Precautions: Fall Restrictions Weight Bearing Restrictions: No      Mobility  Bed Mobility Overal bed mobility: (pt received and left sitting in recliner)                Transfers Overall transfer level: Independent Equipment used: None                Ambulation/Gait Ambulation/Gait assistance: Supervision Gait Distance (Feet): 100 Feet Assistive device: None Gait Pattern/deviations: Drifts right/left Gait velocity: slightly decreased Gait velocity interpretation: 1.31 - 2.62 ft/sec, indicative of limited community ambulator General Gait Details: slight increase in lateral sway with reduced gait speed  Stairs            Wheelchair Mobility    Modified Rankin (Stroke  Patients Only) Modified Rankin (Stroke Patients Only) Pre-Morbid Rankin Score: No symptoms Modified Rankin: Moderate disability     Balance Overall balance assessment: Needs assistance Sitting-balance support: No upper extremity supported;Feet supported Sitting balance-Leahy Scale: Normal     Standing balance support: No upper extremity supported Standing balance-Leahy Scale: Good Standing balance comment: supervision                             Pertinent Vitals/Pain Pain Assessment: No/denies pain    Home Living Family/patient expects to be discharged to:: Private residence Living Arrangements: Alone Available Help at Discharge: Family;Available 24 hours/day(24/7 initially) Type of Home: House Home Access: Stairs to enter Entrance Stairs-Rails: Right;Left;Can reach both Entrance Stairs-Number of Steps: 4-5 Home Layout: One level Home Equipment: Other (comment);Walker - 2 wheels;Cane - quad;Wheelchair - manual;Bedside commode(walking stick) Additional Comments: Pt sponge bathes at baseline    Prior Function Level of Independence: Independent with assistive device(s)         Comments: household ambulator without AD, uses walking stick for outside and short community distances     Hand Dominance   Dominant Hand: Right    Extremity/Trunk Assessment   Upper Extremity Assessment Upper Extremity Assessment: Overall WFL for tasks assessed    Lower Extremity Assessment Lower Extremity Assessment: Overall WFL for tasks assessed    Cervical / Trunk Assessment Cervical / Trunk Assessment: Kyphotic  Communication   Communication: HOH  Cognition Arousal/Alertness: Awake/alert Behavior During Therapy: WFL for tasks assessed/performed Overall Cognitive Status:  Within Functional Limits for tasks assessed                                        General Comments General comments (skin integrity, edema, etc.): VSS    Exercises      Assessment/Plan    PT Assessment Patient needs continued PT services  PT Problem List Decreased balance;Decreased mobility;Decreased knowledge of use of DME       PT Treatment Interventions DME instruction;Gait training;Stair training;Functional mobility training;Balance training;Neuromuscular re-education;Patient/family education    PT Goals (Current goals can be found in the Care Plan section)  Acute Rehab PT Goals Patient Stated Goal: To go home PT Goal Formulation: With patient Time For Goal Achievement: 11/05/19 Potential to Achieve Goals: Good Additional Goals Additional Goal #1: Pt will maintain dynamic standing balance within 10 inches of her base of support with unilateral UE support of LRAD, modI.    Frequency Min 4X/week   Barriers to discharge        Co-evaluation               AM-PAC PT "6 Clicks" Mobility  Outcome Measure Help needed turning from your back to your side while in a flat bed without using bedrails?: None Help needed moving from lying on your back to sitting on the side of a flat bed without using bedrails?: None Help needed moving to and from a bed to a chair (including a wheelchair)?: None Help needed standing up from a chair using your arms (e.g., wheelchair or bedside chair)?: None Help needed to walk in hospital room?: None Help needed climbing 3-5 steps with a railing? : None 6 Click Score: 24    End of Session Equipment Utilized During Treatment: (none) Activity Tolerance: Patient tolerated treatment well Patient left: in chair;with call bell/phone within reach;with family/visitor present Nurse Communication: Mobility status PT Visit Diagnosis: Unsteadiness on feet (R26.81)    Time: 5885-0277 PT Time Calculation (min) (ACUTE ONLY): 13 min   Charges:   PT Evaluation $PT Eval Low Complexity: [place], PT, DPT Acute Rehabilitation Pager: [phone]   Zenaida Niece 10/22/2019, 11:26 AM

## 2019-10-22 NOTE — Discharge Summary (Signed)
Physician Discharge Summary  Denise Parrish:[id number] DOB: 04/13/1923 DOA: 10/16/2019  PCP: [name], MD  Admit date: 10/16/2019 Discharge date: 10/22/2019  Admitted From: Home  disposition: Home Recommendations for Outpatient Follow-up:  1. Follow up with PCP in 1-2 weeks 2. Please obtain BMP/CBC in one week 3. Please follow-up with Dr. Donzetta Matters  Home Health: None Equipment/Devices: None  Discharge Condition: Stable and improved CODE STATUS: Full code Diet recommendation cardiac diet Brief/Interim Summary:86 year old female with type 2 diabetes and hypertension admitted with slurred speech and left facial droop which has been resolved. However she is found to have 75% right carotid occlusion she was transferred to [facility] from [facility] for vascular surgery consult Discharge Diagnoses:  Principal Problem:   Stroke [facility]) Active Problems:   TIA (transient ischemic attack)   Slurred speech   Facial droop   Essential hypertension   Diabetes ([place])   CVA (cerebral vascular accident) ([place])   #1 right MCA distribution stroke with left facial droop, and slurred speech which has been resolved. Echo normal ejection fraction grade 1 diastolic dysfunction LDL is 98 Hemoglobin A1c 6.1 She was seen by PT OT and speech therapy with no further recommendations on discharge Continue aspirin and Plavix and Lipitor. Patient ambulated in the room with the staff.  #2 right carotid stenosis s/p CEA 10/21/2019.  Continue aspirin statin and Plavix.  #3 essential hypertension blood pressure 108/64.  She was on metoprolol 25 mg twice a day at home which has been on hold during the hospital stay due to soft blood pressure.  Upon discharge I am not restarting the metoprolol.  Please follow-up as an outpatient check her blood pressure and restart if needed.   #4 anxiety continue Xanax  #5 type 2 diabetes-continue amaryl   #6 GERD continue Prilosec  Estimated body mass index is  27.22 kg/m as calculated from the following:   Height as of this encounter: 5' 6"  (1.676 m).   Weight as of this encounter: 76.5 kg.  Discharge Instructions   Allergies as of 10/22/2019   No Known Allergies     Medication List    STOP taking these medications   aspirin EC 81 MG tablet Replaced by: aspirin 325 MG tablet   metoprolol tartrate 25 MG tablet Commonly known as: LOPRESSOR     TAKE these medications   ALPRAZolam 0.5 MG tablet Commonly known as: XANAX Take 0.5 mg by mouth at bedtime. *May take one tablet three times daily as needed for anxiety**   aspirin 325 MG tablet Take 1 tablet (325 mg total) by mouth daily. Replaces: aspirin EC 81 MG tablet   atorvastatin 40 MG tablet Commonly known as: LIPITOR Take 1 tablet (40 mg total) by mouth daily at 6 PM.   clopidogrel 75 MG tablet Commonly known as: PLAVIX Take 1 tablet (75 mg total) by mouth daily. Start taking on: October 23, 2019   glimepiride 1 MG tablet Commonly known as: AMARYL Take 1 mg by mouth daily with breakfast.   omeprazole 20 MG capsule Commonly known as: PRILOSEC Take 20 mg by mouth daily. *May take one capsule twice daily      Follow-up Information    Waynetta Sandy, MD In 3 weeks.   Specialties: Vascular Surgery, Cardiology Why: Office will call you to arrange your appt (sent) Contact information: [place] [id number] [phone]        [name], MD Follow up.   Specialty: Family Medicine Contact information: [phone]  [place] [id number] [phone]          No Known Allergies  Consultations:  Neurology and vascular surgery   Procedures/Studies: CT Angio Head W or Wo Contrast  Result Date: 10/16/2019 CLINICAL DATA:  Initial evaluation for acute left facial weakness. EXAM: CT ANGIOGRAPHY HEAD AND NECK TECHNIQUE: Multidetector CT imaging of the head and neck was performed using the standard protocol during bolus  administration of intravenous contrast. Multiplanar CT image reconstructions and MIPs were obtained to evaluate the vascular anatomy. Carotid stenosis measurements (when applicable) are obtained utilizing NASCET criteria, using the distal internal carotid diameter as the denominator. CONTRAST:  100 cc of Omnipaque 350. COMPARISON:  None available. FINDINGS: CT HEAD FINDINGS Brain: Generalized age-related cerebral atrophy. Patchy and confluent hypodensity within the periventricular deep white matter both cerebral hemispheres most consistent with chronic small vessel ischemic disease, moderate in nature. Small remote left cerebellar infarct noted. No acute intracranial hemorrhage. No acute large vessel territory infarct. No mass lesion, midline shift or mass effect. No hydrocephalus. No extra-axial fluid collection. Vascular: No hyperdense vessel. Calcified atherosclerosis present at the skull base. Skull: Scalp soft tissues within normal limits.  Calvarium intact. Sinuses/Orbits: Globes and orbital soft tissues demonstrate no acute finding. Right gaze noted. Paranasal sinuses and mastoid air cells are clear. Other: None. ASPECTS ([place] Stroke Program Early CT Score) - Ganglionic level infarction (caudate, lentiform nuclei, internal capsule, insula, M1-M3 cortex): 7 - Supraganglionic infarction (M4-M6 cortex): 3 Total score (0-10 with 10 being normal): 10 CTA NECK FINDINGS Aortic arch: Visualized aortic arch of normal caliber with normal 3 vessel morphology. Moderate atherosclerotic change about the visualized arch and origin of the great vessels without hemodynamically significant stenosis. Visualized subclavian arteries widely patent. Right carotid system: Right common carotid artery patent from its origin to the bifurcation without stenosis. Bulky calcified plaque about the right bifurcation/proximal right ICA with associated stenosis of up to 75% by NASCET criteria. Right ICA otherwise widely patent distally to  the skull base without stenosis, dissection or occlusion. Left carotid system: Calcified plaque at the origin of the left CCA without significant stenosis. Left CCA widely patent distally to the bifurcation. Scattered mixed plaque about the left bifurcation without hemodynamically significant stenosis. Left ICA widely patent distally to the skull base without stenosis, dissection or occlusion. Vertebral arteries: Both vertebral arteries arise from the subclavian arteries. Vertebral arteries widely patent within the neck without stenosis, dissection or occlusion. Skeleton: No acute osseous abnormality. No discrete lytic or blastic osseous lesions. Prior ACDF at C4-5 noted. Patient is edentulous. Mild degenerative changes noted about the TMJs bilaterally. Other neck: No other acute soft tissue abnormality within the neck. No adenopathy. Normal thyroid. Soft tissue lesions seen within both parotid glands measuring up to 9 mm on the left and 8 mm on the right noted, indeterminate, but felt to be of doubtful significance given patient age. Upper chest: Visualized upper chest demonstrates no acute finding. Irregular biapical pleuroparenchymal thickening noted about the lung apices. Visualized lungs are otherwise clear. Review of the MIP images confirms the above findings CTA HEAD FINDINGS Anterior circulation: Right ICA widely patent through the siphon without stenosis. Scattered atheromatous plaque within the cavernous and supraclinoid left ICA with no more than mild multifocal stenosis. A1 segments widely patent. Normal anterior communicating artery. Anterior cerebral arteries widely patent to their distal aspects without stenosis. No M1 stenosis or occlusion. Normal MCA bifurcations. Distal MCA branches well perfused and symmetric. Posterior circulation: Vertebral arteries widely  patent to the vertebrobasilar junction without stenosis. Left vertebral dominant. Patent left PICA. Right PICA not seen. Basilar widely  patent to its distal aspect without stenosis. Superior cerebral arteries patent bilaterally. Left PCA primarily supplied via the basilar. Fetal type origin of the right PCA. Short-segment severe stenosis at the left P1/P2 junction (series 11, image 20). PCAs otherwise widely patent to their distal aspects. Venous sinuses: Patent. Anatomic variants: Fetal type origin of the right PCA. No intracranial aneurysm. Review of the MIP images confirms the above findings IMPRESSION: CT HEAD IMPRESSION: 1. No acute intracranial abnormality identified. 2. Aspects equals 10/10. 3. Age-related cerebral atrophy with moderate chronic microvascular ischemic disease, with small remote left cerebellar infarct. CTA HEAD AND NECK IMPRESSION: 1. Negative CTA for large vessel occlusion. 2. Bulky calcified plaque about the right carotid bifurcation with associated stenosis of up to 75% by NASCET criteria. 3. Short-segment severe stenosis at the left P1/P2 junction. 4. Mild for age atheromatous change elsewhere about the major arterial vasculature of the head and neck. No other high-grade or correctable stenosis. Critical Value/emergent results were called by telephone at the time of interpretation on 10/16/2019 at 7:27 pm to provider [facility] , who verbally acknowledged these results. Electronically Signed   By: Jeannine Boga M.D.   On: 10/16/2019 20:07   DG Chest 2 View  Result Date: 10/16/2019 CLINICAL DATA:  TIA EXAM: CHEST - 2 VIEW COMPARISON:  10/05/2019 FINDINGS: Heart and mediastinal contours are within normal limits. No focal opacities or effusions. No acute bony abnormality. IMPRESSION: No active cardiopulmonary disease. Electronically Signed   By: Rolm Baptise M.D.   On: 10/16/2019 21:25   CT Angio Neck W and/or Wo Contrast  Result Date: 10/16/2019 CLINICAL DATA:  Initial evaluation for acute left facial weakness. EXAM: CT ANGIOGRAPHY HEAD AND NECK TECHNIQUE: Multidetector CT imaging of the head and neck was  performed using the standard protocol during bolus administration of intravenous contrast. Multiplanar CT image reconstructions and MIPs were obtained to evaluate the vascular anatomy. Carotid stenosis measurements (when applicable) are obtained utilizing NASCET criteria, using the distal internal carotid diameter as the denominator. CONTRAST:  100 cc of Omnipaque 350. COMPARISON:  None available. FINDINGS: CT HEAD FINDINGS Brain: Generalized age-related cerebral atrophy. Patchy and confluent hypodensity within the periventricular deep white matter both cerebral hemispheres most consistent with chronic small vessel ischemic disease, moderate in nature. Small remote left cerebellar infarct noted. No acute intracranial hemorrhage. No acute large vessel territory infarct. No mass lesion, midline shift or mass effect. No hydrocephalus. No extra-axial fluid collection. Vascular: No hyperdense vessel. Calcified atherosclerosis present at the skull base. Skull: Scalp soft tissues within normal limits.  Calvarium intact. Sinuses/Orbits: Globes and orbital soft tissues demonstrate no acute finding. Right gaze noted. Paranasal sinuses and mastoid air cells are clear. Other: None. ASPECTS ([place] Stroke Program Early CT Score) - Ganglionic level infarction (caudate, lentiform nuclei, internal capsule, insula, M1-M3 cortex): 7 - Supraganglionic infarction (M4-M6 cortex): 3 Total score (0-10 with 10 being normal): 10 CTA NECK FINDINGS Aortic arch: Visualized aortic arch of normal caliber with normal 3 vessel morphology. Moderate atherosclerotic change about the visualized arch and origin of the great vessels without hemodynamically significant stenosis. Visualized subclavian arteries widely patent. Right carotid system: Right common carotid artery patent from its origin to the bifurcation without stenosis. Bulky calcified plaque about the right bifurcation/proximal right ICA with associated stenosis of up to 75% by NASCET  criteria. Right ICA otherwise widely patent distally to the  skull base without stenosis, dissection or occlusion. Left carotid system: Calcified plaque at the origin of the left CCA without significant stenosis. Left CCA widely patent distally to the bifurcation. Scattered mixed plaque about the left bifurcation without hemodynamically significant stenosis. Left ICA widely patent distally to the skull base without stenosis, dissection or occlusion. Vertebral arteries: Both vertebral arteries arise from the subclavian arteries. Vertebral arteries widely patent within the neck without stenosis, dissection or occlusion. Skeleton: No acute osseous abnormality. No discrete lytic or blastic osseous lesions. Prior ACDF at C4-5 noted. Patient is edentulous. Mild degenerative changes noted about the TMJs bilaterally. Other neck: No other acute soft tissue abnormality within the neck. No adenopathy. Normal thyroid. Soft tissue lesions seen within both parotid glands measuring up to 9 mm on the left and 8 mm on the right noted, indeterminate, but felt to be of doubtful significance given patient age. Upper chest: Visualized upper chest demonstrates no acute finding. Irregular biapical pleuroparenchymal thickening noted about the lung apices. Visualized lungs are otherwise clear. Review of the MIP images confirms the above findings CTA HEAD FINDINGS Anterior circulation: Right ICA widely patent through the siphon without stenosis. Scattered atheromatous plaque within the cavernous and supraclinoid left ICA with no more than mild multifocal stenosis. A1 segments widely patent. Normal anterior communicating artery. Anterior cerebral arteries widely patent to their distal aspects without stenosis. No M1 stenosis or occlusion. Normal MCA bifurcations. Distal MCA branches well perfused and symmetric. Posterior circulation: Vertebral arteries widely patent to the vertebrobasilar junction without stenosis. Left vertebral dominant.  Patent left PICA. Right PICA not seen. Basilar widely patent to its distal aspect without stenosis. Superior cerebral arteries patent bilaterally. Left PCA primarily supplied via the basilar. Fetal type origin of the right PCA. Short-segment severe stenosis at the left P1/P2 junction (series 11, image 20). PCAs otherwise widely patent to their distal aspects. Venous sinuses: Patent. Anatomic variants: Fetal type origin of the right PCA. No intracranial aneurysm. Review of the MIP images confirms the above findings IMPRESSION: CT HEAD IMPRESSION: 1. No acute intracranial abnormality identified. 2. Aspects equals 10/10. 3. Age-related cerebral atrophy with moderate chronic microvascular ischemic disease, with small remote left cerebellar infarct. CTA HEAD AND NECK IMPRESSION: 1. Negative CTA for large vessel occlusion. 2. Bulky calcified plaque about the right carotid bifurcation with associated stenosis of up to 75% by NASCET criteria. 3. Short-segment severe stenosis at the left P1/P2 junction. 4. Mild for age atheromatous change elsewhere about the major arterial vasculature of the head and neck. No other high-grade or correctable stenosis. Critical Value/emergent results were called by telephone at the time of interpretation on 10/16/2019 at 7:27 pm to provider [facility] , who verbally acknowledged these results. Electronically Signed   By: Jeannine Boga M.D.   On: 10/16/2019 20:07   MR BRAIN WO CONTRAST  Result Date: 10/16/2019 CLINICAL DATA:  86 year old female with code stroke presentation earlier today. Slurred speech, left facial droop, confusion. EXAM: MRI HEAD WITHOUT CONTRAST TECHNIQUE: Multiplanar, multiecho pulse sequences of the brain and surrounding structures were obtained without intravenous contrast. COMPARISON:  CT head, CTA head and neck earlier today. FINDINGS: Brain: Patchy restricted diffusion of the right frontal operculum and subcortical white matter tracking toward the right  motor strip (series 12, image 37, and series 5, image 15). Faint T2 and FLAIR hyperintensity. No evidence of hemorrhage. No mass effect. No other restricted diffusion. Bilateral cerebral white matter confluent T2 and FLAIR hyperintensity with deep white matter capsule involvement. Small  chronic infarcts in the cerebellum mostly the left (series 7, image 3). Comparatively mild T2 heterogeneity in the bilateral deep gray nuclei and brainstem. No chronic cortical encephalomalacia identified. No chronic cerebral blood products identified. No midline shift, mass effect, evidence of mass lesion, ventriculomegaly, extra-axial collection or acute intracranial hemorrhage. Cervicomedullary junction and pituitary are within normal limits. Vascular: Major intracranial vascular flow voids are preserved. Skull and upper cervical spine: Negative for age visible cervical spine. Visualized bone marrow signal is within normal limits. Sinuses/Orbits: Postoperative changes to both globes, otherwise negative orbits. Paranasal Visualized paranasal sinuses and mastoids are stable and well pneumatized. Other: Scalp and face soft tissues appear negative. IMPRESSION: 1. Patchy acute infarct in the Right MCA territory: right frontal operculum and middle frontal gyrus. No associated hemorrhage or mass effect. 2. Underlying chronic ischemic disease mostly affecting the cerebral white matter and the cerebellum. Electronically Signed   By: Genevie Ann M.D.   On: 10/16/2019 21:43   ECHOCARDIOGRAM COMPLETE  Result Date: 10/17/2019   ECHOCARDIOGRAM REPORT   Patient Name:   KACIA HALLEY Date of Exam: 10/17/2019 Medical Rec #:  [id number]          Height:       65.0 in Accession #:    [id number]         Weight:       163.6 lb Date of Birth:  1923/02/13           BSA:          1.82 m Patient Age:    51 years           BP:           107/50 mmHg Patient Gender: F                  HR:           59 bpm. Exam Location:  [facility] Procedure: 2D Echo  Indications:    TIA 435.9 / G45.9  History:        Patient has no prior history of Echocardiogram examinations.                 Stroke and TIA; Risk Factors:Hypertension and Diabetes.  Sonographer:    Leavy Cella RDCS (AE) Referring Phys: [place]  1. Left ventricular ejection fraction, by visual estimation, is 60 to 65%. The left ventricle has normal function. There is mildly increased left ventricular hypertrophy.  2. Elevated left ventricular end-diastolic pressure.  3. Left ventricular diastolic parameters are consistent with Grade I diastolic dysfunction (impaired relaxation).  4. The left ventricle has no regional wall motion abnormalities.  5. Global right ventricle has normal systolic function.The right ventricular size is normal. No increase in right ventricular wall thickness.  6. Left atrial size was normal.  7. Right atrial size was normal.  8. Moderate mitral annular calcification.  9. The mitral valve is degenerative. Trivial mitral valve regurgitation. Mild mitral stenosis. 10. The tricuspid valve is grossly normal. 11. The tricuspid valve is grossly normal. Tricuspid valve regurgitation is mild. 12. The aortic valve is tricuspid. Aortic valve regurgitation is not visualized. Moderate to severe aortic valve stenosis. 13. The pulmonic valve was grossly normal. Pulmonic valve regurgitation is not visualized. 14. Moderately elevated pulmonary artery systolic pressure. 15. The inferior vena cava is normal in size with greater than 50% respiratory variability, suggesting right atrial pressure of 3 mmHg. FINDINGS  Left Ventricle: Left ventricular ejection fraction, by  visual estimation, is 60 to 65%. The left ventricle has normal function. The left ventricle has no regional wall motion abnormalities. There is mildly increased left ventricular hypertrophy. Concentric left ventricular hypertrophy. Left ventricular diastolic parameters are consistent with Grade I diastolic dysfunction  (impaired relaxation). Elevated left ventricular end-diastolic pressure. Right Ventricle: The right ventricular size is normal. No increase in right ventricular wall thickness. Global RV systolic function is has normal systolic function. The tricuspid regurgitant velocity is 2.86 m/s, and with an assumed right atrial pressure  of 10 mmHg, the estimated right ventricular systolic pressure is moderately elevated at 42.7 mmHg. Left Atrium: Left atrial size was normal in size. Right Atrium: Right atrial size was normal in size Pericardium: There is no evidence of pericardial effusion. Mitral Valve: The mitral valve is degenerative in appearance. There is mild thickening of the mitral valve leaflet(s). There is mild calcification of the mitral valve leaflet(s). Moderate mitral annular calcification. Trivial mitral valve regurgitation. Mild mitral valve stenosis by observation. MV peak gradient, 11.0 mmHg. Tricuspid Valve: The tricuspid valve is grossly normal. Tricuspid valve regurgitation is mild. Aortic Valve: The aortic valve is tricuspid. . There is mild thickening and mild calcification of the aortic valve. Aortic valve regurgitation is not visualized. Moderate to severe aortic stenosis is present. Moderate aortic valve annular calcification. There is mild thickening of the aortic valve. There is mild calcification of the aortic valve. Aortic valve mean gradient measures 34.3 mmHg. Aortic valve peak gradient measures 57.1 mmHg. Pulmonic Valve: The pulmonic valve was grossly normal. Pulmonic valve regurgitation is not visualized. Pulmonic regurgitation is not visualized. Aorta: The aortic root is normal in size and structure. Venous: The inferior vena cava is normal in size with greater than 50% respiratory variability, suggesting right atrial pressure of 3 mmHg. IAS/Shunts: No atrial level shunt detected by color flow Doppler.  LEFT VENTRICLE PLAX 2D LVIDd:         3.77 cm Diastology LVIDs:         2.97 cm LV e'  lateral:   5.03 cm/s LV PW:         1.32 cm LV E/e' lateral: 25.6 LV IVS:        1.04 cm LV e' medial:    4.30 cm/s LV SV:         27 ml   LV E/e' medial:  30.0 LV SV Index:   14.31  RIGHT VENTRICLE RV S prime:     10.90 cm/s TAPSE (M-mode): 2.2 cm LEFT ATRIUM             Index       RIGHT ATRIUM           Index LA diam:        3.70 cm 2.04 cm/m  RA Area:     14.30 cm LA Vol (A2C):   68.1 ml 37.50 ml/m RA Volume:   36.50 ml  20.10 ml/m LA Vol (A4C):   30.5 ml 16.79 ml/m LA Biplane Vol: 50.6 ml 27.86 ml/m  AORTIC VALVE AV Vmax:           377.67 cm/s AV Vmean:          274.000 cm/s AV VTI:            0.930 m AV Peak Grad:      57.1 mmHg AV Mean Grad:      34.3 mmHg LVOT Vmax:         139.67 cm/s LVOT Vmean:  107.267 cm/s LVOT VTI:          0.342 m LVOT/AV VTI ratio: 0.37  AORTA Ao Root diam: 2.30 cm MITRAL VALVE                         TRICUSPID VALVE MV Area (PHT): 1.76 cm              TR Peak grad:   32.7 mmHg MV Peak grad:  11.0 mmHg             TR Vmax:        286.00 cm/s MV Mean grad:  4.0 mmHg MV Vmax:       1.66 m/s              SHUNTS MV Vmean:      97.4 cm/s             Systemic VTI: 0.34 m MV VTI:        0.53 m MV PHT:        124.70 msec MV Decel Time: 430 msec MV E velocity: 129.00 cm/s 103 cm/s MV A velocity: 160.00 cm/s 70.3 cm/s MV E/A ratio:  0.81        1.5  Kate Sable MD Electronically signed by Kate Sable MD Signature Date/Time: 10/17/2019/11:35:16 AM    Final    CT HEAD CODE STROKE WO CONTRAST  Result Date: 10/16/2019 CLINICAL DATA:  Initial evaluation for acute left facial weakness. EXAM: CT ANGIOGRAPHY HEAD AND NECK TECHNIQUE: Multidetector CT imaging of the head and neck was performed using the standard protocol during bolus administration of intravenous contrast. Multiplanar CT image reconstructions and MIPs were obtained to evaluate the vascular anatomy. Carotid stenosis measurements (when applicable) are obtained utilizing NASCET criteria, using the distal  internal carotid diameter as the denominator. CONTRAST:  100 cc of Omnipaque 350. COMPARISON:  None available. FINDINGS: CT HEAD FINDINGS Brain: Generalized age-related cerebral atrophy. Patchy and confluent hypodensity within the periventricular deep white matter both cerebral hemispheres most consistent with chronic small vessel ischemic disease, moderate in nature. Small remote left cerebellar infarct noted. No acute intracranial hemorrhage. No acute large vessel territory infarct. No mass lesion, midline shift or mass effect. No hydrocephalus. No extra-axial fluid collection. Vascular: No hyperdense vessel. Calcified atherosclerosis present at the skull base. Skull: Scalp soft tissues within normal limits.  Calvarium intact. Sinuses/Orbits: Globes and orbital soft tissues demonstrate no acute finding. Right gaze noted. Paranasal sinuses and mastoid air cells are clear. Other: None. ASPECTS ([place] Stroke Program Early CT Score) - Ganglionic level infarction (caudate, lentiform nuclei, internal capsule, insula, M1-M3 cortex): 7 - Supraganglionic infarction (M4-M6 cortex): 3 Total score (0-10 with 10 being normal): 10 CTA NECK FINDINGS Aortic arch: Visualized aortic arch of normal caliber with normal 3 vessel morphology. Moderate atherosclerotic change about the visualized arch and origin of the great vessels without hemodynamically significant stenosis. Visualized subclavian arteries widely patent. Right carotid system: Right common carotid artery patent from its origin to the bifurcation without stenosis. Bulky calcified plaque about the right bifurcation/proximal right ICA with associated stenosis of up to 75% by NASCET criteria. Right ICA otherwise widely patent distally to the skull base without stenosis, dissection or occlusion. Left carotid system: Calcified plaque at the origin of the left CCA without significant stenosis. Left CCA widely patent distally to the bifurcation. Scattered mixed plaque about  the left bifurcation without hemodynamically significant stenosis. Left ICA widely patent distally to the  skull base without stenosis, dissection or occlusion. Vertebral arteries: Both vertebral arteries arise from the subclavian arteries. Vertebral arteries widely patent within the neck without stenosis, dissection or occlusion. Skeleton: No acute osseous abnormality. No discrete lytic or blastic osseous lesions. Prior ACDF at C4-5 noted. Patient is edentulous. Mild degenerative changes noted about the TMJs bilaterally. Other neck: No other acute soft tissue abnormality within the neck. No adenopathy. Normal thyroid. Soft tissue lesions seen within both parotid glands measuring up to 9 mm on the left and 8 mm on the right noted, indeterminate, but felt to be of doubtful significance given patient age. Upper chest: Visualized upper chest demonstrates no acute finding. Irregular biapical pleuroparenchymal thickening noted about the lung apices. Visualized lungs are otherwise clear. Review of the MIP images confirms the above findings CTA HEAD FINDINGS Anterior circulation: Right ICA widely patent through the siphon without stenosis. Scattered atheromatous plaque within the cavernous and supraclinoid left ICA with no more than mild multifocal stenosis. A1 segments widely patent. Normal anterior communicating artery. Anterior cerebral arteries widely patent to their distal aspects without stenosis. No M1 stenosis or occlusion. Normal MCA bifurcations. Distal MCA branches well perfused and symmetric. Posterior circulation: Vertebral arteries widely patent to the vertebrobasilar junction without stenosis. Left vertebral dominant. Patent left PICA. Right PICA not seen. Basilar widely patent to its distal aspect without stenosis. Superior cerebral arteries patent bilaterally. Left PCA primarily supplied via the basilar. Fetal type origin of the right PCA. Short-segment severe stenosis at the left P1/P2 junction (series 11,  image 20). PCAs otherwise widely patent to their distal aspects. Venous sinuses: Patent. Anatomic variants: Fetal type origin of the right PCA. No intracranial aneurysm. Review of the MIP images confirms the above findings IMPRESSION: CT HEAD IMPRESSION: 1. No acute intracranial abnormality identified. 2. Aspects equals 10/10. 3. Age-related cerebral atrophy with moderate chronic microvascular ischemic disease, with small remote left cerebellar infarct. CTA HEAD AND NECK IMPRESSION: 1. Negative CTA for large vessel occlusion. 2. Bulky calcified plaque about the right carotid bifurcation with associated stenosis of up to 75% by NASCET criteria. 3. Short-segment severe stenosis at the left P1/P2 junction. 4. Mild for age atheromatous change elsewhere about the major arterial vasculature of the head and neck. No other high-grade or correctable stenosis. Critical Value/emergent results were called by telephone at the time of interpretation on 10/16/2019 at 7:27 pm to provider [facility] , who verbally acknowledged these results. Electronically Signed   By: Jeannine Boga M.D.   On: 10/16/2019 20:07    (Echo, Carotid, EGD, Colonoscopy, ERCP)    Subjective: Patient awake alert sitting up in bed having breakfast denies any new complaints anxious to go home denies headache nausea vomiting difficulty swallowing difficulty with speech or headaches.  Discharge Exam: Vitals:   10/22/19 0400 10/22/19 0814  BP: 111/60 108/64  Pulse: 91 95  Resp: 13 18  Temp: 98.7 F (37.1 C) 97.9 F (36.6 C)  SpO2: 94%    Vitals:   10/22/19 0200 10/22/19 0300 10/22/19 0400 10/22/19 0814  BP: (!) 106/51 (!) 100/57 111/60 108/64  Pulse: 90 89 91 95  Resp: 16 16 13 18   Temp:   98.7 F (37.1 C) 97.9 F (36.6 C)  TempSrc:   Oral Oral  SpO2: 93% 93% 94%   Weight:      Height:        General: Pt is alert, awake, not in acute distress Cardiovascular: RRR, S1/S2 +, no rubs, no gallops Respiratory: CTA  bilaterally, no wheezing, no rhonchi Abdominal: Soft, NT, ND, bowel sounds + Extremities: no edema, no cyanosis    The results of significant diagnostics from this hospitalization (including imaging, microbiology, ancillary and laboratory) are listed below for reference.     Microbiology: Recent Results (from the past 240 hour(s))  Respiratory Panel by RT PCR (Flu A&B, Covid) - Nasopharyngeal Swab     Status: None   Collection Time: 10/16/19  7:06 PM   Specimen: Nasopharyngeal Swab  Result Value Ref Range Status   SARS Coronavirus 2 by RT PCR NEGATIVE NEGATIVE Final    Comment: (NOTE) SARS-CoV-2 target nucleic acids are NOT DETECTED. The SARS-CoV-2 RNA is generally detectable in upper respiratoy specimens during the acute phase of infection. The lowest concentration of SARS-CoV-2 viral copies this assay can detect is 131 copies/mL. A negative result does not preclude SARS-Cov-2 infection and should not be used as the sole basis for treatment or other patient management decisions. A negative result may occur with  improper specimen collection/handling, submission of specimen other than nasopharyngeal swab, presence of viral mutation(s) within the areas targeted by this assay, and inadequate number of viral copies (<131 copies/mL). A negative result must be combined with clinical observations, patient history, and epidemiological information. The expected result is Negative. Fact Sheet for Patients:  PinkCheek.be Fact Sheet for Healthcare Providers:  GravelBags.it This test is not yet ap proved or cleared by the [place] FDA and  has been authorized for detection and/or diagnosis of SARS-CoV-2 by FDA under an Emergency Use Authorization (EUA). This EUA will remain  in effect (meaning this test can be used) for the duration of the COVID-19 declaration under Section 564(b)(1) of the Act, 21 U.S.C. section 360bbb-3(b)(1),  unless the authorization is terminated or revoked sooner.    Influenza A by PCR NEGATIVE NEGATIVE Final   Influenza B by PCR NEGATIVE NEGATIVE Final    Comment: (NOTE) The Xpert Xpress SARS-CoV-2/FLU/RSV assay is intended as an aid in  the diagnosis of influenza from Nasopharyngeal swab specimens and  should not be used as a sole basis for treatment. Nasal washings and  aspirates are unacceptable for Xpert Xpress SARS-CoV-2/FLU/RSV  testing. Fact Sheet for Patients: PinkCheek.be Fact Sheet for Healthcare Providers: GravelBags.it This test is not yet approved or cleared by the [place] FDA and  has been authorized for detection and/or diagnosis of SARS-CoV-2 by  FDA under an Emergency Use Authorization (EUA). This EUA will remain  in effect (meaning this test can be used) for the duration of the  Covid-19 declaration under Section 564(b)(1) of the Act, 21  U.S.C. section 360bbb-3(b)(1), unless the authorization is  terminated or revoked. Performed at [facility], [address], [place], [place] [address]   MRSA PCR Screening     Status: None   Collection Time: 10/21/19  4:31 AM   Specimen: Nasal Mucosa; Nasopharyngeal  Result Value Ref Range Status   MRSA by PCR NEGATIVE NEGATIVE Final    Comment:        The GeneXpert MRSA Assay (FDA approved for NASAL specimens only), is one component of a comprehensive MRSA colonization surveillance program. It is not intended to diagnose MRSA infection nor to guide or monitor treatment for MRSA infections. Performed at [facility], [place] [address], [place], [place] [address]      Labs: BNP (last 3 results) No results for input(s): BNP in the last 8760 hours. Basic Metabolic Panel: Recent Labs  Lab 10/16/19 1903 10/17/19 0501  10/19/19 0125  NA 139 140 141  K 4.2 3.7 4.3  CL 103 105 106  CO2 28 25 25   GLUCOSE 122* 108* 114*  BUN 17 17 14   CREATININE 1.12*  1.03* 1.02*  CALCIUM 9.4 8.8* 9.1   Liver Function Tests: Recent Labs  Lab 10/16/19 1903 10/17/19 0501  AST 24 19  ALT 20 17  ALKPHOS 66 52  BILITOT 0.8 0.6  PROT 7.4 5.9*  ALBUMIN 4.1 3.4*   No results for input(s): LIPASE, AMYLASE in the last 168 hours. No results for input(s): AMMONIA in the last 168 hours. CBC: Recent Labs  Lab 10/16/19 1903 10/17/19 0501 10/19/19 0125 10/21/19 0537 10/22/19 0844  WBC 6.4 6.2 6.7 6.9 19.4*  NEUTROABS 2.8  --   --   --   --   HGB 13.8 12.0 12.5 13.1 12.8  HCT 42.7 36.4 38.3 39.6 39.8  MCV 95.5 95.5 94.6 93.2 95.7  PLT 295 249 258 276 318   Cardiac Enzymes: No results for input(s): CKTOTAL, CKMB, CKMBINDEX, TROPONINI in the last 168 hours. BNP: Invalid input(s): POCBNP CBG: Recent Labs  Lab 10/21/19 0922 10/21/19 1326 10/21/19 1606 10/21/19 2048 10/22/19 0632  GLUCAP 118* 147* 141* 252* 125*   D-Dimer No results for input(s): DDIMER in the last 72 hours. Hgb A1c No results for input(s): HGBA1C in the last 72 hours. Lipid Profile No results for input(s): CHOL, HDL, LDLCALC, TRIG, CHOLHDL, LDLDIRECT in the last 72 hours. Thyroid function studies No results for input(s): TSH, T4TOTAL, T3FREE, THYROIDAB in the last 72 hours.  Invalid input(s): FREET3 Anemia work up No results for input(s): VITAMINB12, FOLATE, FERRITIN, TIBC, IRON, RETICCTPCT in the last 72 hours. Urinalysis    Component Value Date/Time   COLORURINE COLORLESS (A) 10/16/2019 2037   APPEARANCEUR CLEAR 10/16/2019 2037   LABSPEC 1.015 10/16/2019 2037   PHURINE 6.0 10/16/2019 2037   GLUCOSEU NEGATIVE 10/16/2019 2037   HGBUR NEGATIVE 10/16/2019 2037   [place] NEGATIVE 10/16/2019 2037   [place] NEGATIVE 10/16/2019 2037   PROTEINUR NEGATIVE 10/16/2019 2037   NITRITE NEGATIVE 10/16/2019 2037   LEUKOCYTESUR NEGATIVE 10/16/2019 2037   Sepsis Labs Invalid input(s): PROCALCITONIN,  WBC,  LACTICIDVEN Microbiology Recent Results (from the past 240  hour(s))  Respiratory Panel by RT PCR (Flu A&B, Covid) - Nasopharyngeal Swab     Status: None   Collection Time: 10/16/19  7:06 PM   Specimen: Nasopharyngeal Swab  Result Value Ref Range Status   SARS Coronavirus 2 by RT PCR NEGATIVE NEGATIVE Final    Comment: (NOTE) SARS-CoV-2 target nucleic acids are NOT DETECTED. The SARS-CoV-2 RNA is generally detectable in upper respiratoy specimens during the acute phase of infection. The lowest concentration of SARS-CoV-2 viral copies this assay can detect is 131 copies/mL. A negative result does not preclude SARS-Cov-2 infection and should not be used as the sole basis for treatment or other patient management decisions. A negative result may occur with  improper specimen collection/handling, submission of specimen other than nasopharyngeal swab, presence of viral mutation(s) within the areas targeted by this assay, and inadequate number of viral copies (<131 copies/mL). A negative result must be combined with clinical observations, patient history, and epidemiological information. The expected result is Negative. Fact Sheet for Patients:  PinkCheek.be Fact Sheet for Healthcare Providers:  GravelBags.it This test is not yet ap proved or cleared by the [place] FDA and  has been authorized for detection and/or diagnosis of SARS-CoV-2 by FDA under an Emergency Use Authorization (EUA).  This EUA will remain  in effect (meaning this test can be used) for the duration of the COVID-19 declaration under Section 564(b)(1) of the Act, 21 U.S.C. section 360bbb-3(b)(1), unless the authorization is terminated or revoked sooner.    Influenza A by PCR NEGATIVE NEGATIVE Final   Influenza B by PCR NEGATIVE NEGATIVE Final    Comment: (NOTE) The Xpert Xpress SARS-CoV-2/FLU/RSV assay is intended as an aid in  the diagnosis of influenza from Nasopharyngeal swab specimens and  should not be used as  a sole basis for treatment. Nasal washings and  aspirates are unacceptable for Xpert Xpress SARS-CoV-2/FLU/RSV  testing. Fact Sheet for Patients: PinkCheek.be Fact Sheet for Healthcare Providers: GravelBags.it This test is not yet approved or cleared by the [place] FDA and  has been authorized for detection and/or diagnosis of SARS-CoV-2 by  FDA under an Emergency Use Authorization (EUA). This EUA will remain  in effect (meaning this test can be used) for the duration of the  Covid-19 declaration under Section 564(b)(1) of the Act, 21  U.S.C. section 360bbb-3(b)(1), unless the authorization is  terminated or revoked. Performed at [facility], [address] [place], [place] [address]   MRSA PCR Screening     Status: None   Collection Time: 10/21/19  4:31 AM   Specimen: Nasal Mucosa; Nasopharyngeal  Result Value Ref Range Status   MRSA by PCR NEGATIVE NEGATIVE Final    Comment:        The GeneXpert MRSA Assay (FDA approved for NASAL specimens only), is one component of a comprehensive MRSA colonization surveillance program. It is not intended to diagnose MRSA infection nor to guide or monitor treatment for MRSA infections. Performed at [facility], [place] [address] [place], [place] [address]      Time coordinating discharge: 38 minutes  SIGNED:   Georgette Shell, MD  [facility] 10/22/2019, 9:40 AM Pager   If 7PM-7AM, please contact night-coverage [email or web address] Password [email or web address]

## 2019-10-22 NOTE — Discharge Instructions (Signed)
   [facility]  Discharge Instructions   Carotid Endarterectomy (CEA)  Please refer to the following instructions for your post-procedure care. Your surgeon or physician assistant will discuss any changes with you.  Activity  You are encouraged to walk as much as you can. You can slowly return to normal activities but must avoid strenuous activity and heavy lifting until your doctor tell you it's okay. Avoid activities such as vacuuming or swinging a golf club. You can drive after one week if you are comfortable and you are no longer taking prescription pain medications. It is normal to feel tired for serval weeks after your surgery. It is also normal to have difficulty with sleep habits, eating, and bowel movements after surgery. These will go away with time.  Bathing/Showering  Shower daily after you go home. Do not soak in a bathtub, hot tub, or swim until the incision heals completely.  Incision Care  Shower every day. Clean your incision with mild soap and water. Pat the area dry with a clean towel. You do not need a bandage unless otherwise instructed. Do not apply any ointments or creams to your incision. You may have skin glue on your incision. Do not peel it off. It will come off on its own in about one week. Your incision may feel thickened and raised for several weeks after your surgery. This is normal and the skin will soften over time.   For Men Only: It's okay to shave around the incision but do not shave the incision itself for 2 weeks. It is common to have numbness under your chin that could last for several months.  Diet  Resume your normal diet. There are no special food restrictions following this procedure. A low fat/low cholesterol diet is recommended for all patients with vascular disease. In order to heal from your surgery, it is CRITICAL to get adequate nutrition. Your body requires vitamins, minerals, and protein. Vegetables are the  best source of vitamins and minerals. Vegetables also provide the perfect balance of protein. Processed food has little nutritional value, so try to avoid this.  Medications  Resume taking all of your medications unless your doctor or physician assistant tells you not to. If your incision is causing pain, you may take over-the- counter pain relievers such as acetaminophen (Tylenol). If you were prescribed a stronger pain medication, please be aware these medications can cause nausea and constipation. Prevent nausea by taking the medication with a snack or meal. Avoid constipation by drinking plenty of fluids and eating foods with a high amount of fiber, such as fruits, vegetables, and grains.  Do not take Tylenol if you are taking prescription pain medications.  Follow Up  Our office will schedule a follow up appointment 2-3 weeks following discharge.  Please call us immediately for any of the following conditions  . Increased pain, redness, drainage (pus) from your incision site. . Fever of 101 degrees or higher. . If you should develop stroke (slurred speech, difficulty swallowing, weakness on one side of your body, loss of vision) you should call 911 and go to the nearest emergency room. .  Reduce your risk of vascular disease:  . Stop smoking. If you would like help call [facility] at 1-800-QUIT-NOW (747)831-1193) or [place] at 413-373-8397. . Manage your cholesterol . Maintain a desired weight . Control your diabetes . Keep your blood pressure down .  If you have any questions, please call the office at [phone].

## 2019-10-23 ENCOUNTER — Encounter (HOSPITAL_COMMUNITY): Payer: Self-pay | Admitting: *Deleted

## 2019-10-27 ENCOUNTER — Telehealth: Payer: Self-pay

## 2019-10-27 NOTE — Telephone Encounter (Signed)
Pts daughter called and stated that she is very concerned about her mothers incision. She said that it is swollen and a little red and very tender. She said that it seems to be a knot and that blood may be pooling in that area.   Appt made for pt to be seen tomorrow.   York Cerise, CMA

## 2019-10-28 ENCOUNTER — Ambulatory Visit: Payer: Medicare Other

## 2019-10-28 NOTE — Progress Notes (Deleted)
  POST OPERATIVE OFFICE NOTE    CC:  F/u for surgery; POD 7  HPI:  This is a 86 y.o. female who is s/p right carotid endarterectomy with bovine pericardial patch by Dr. Donzetta Matters on October 21, 2019.  She was initially admitted on October 16, 2019 due to slurred speech and facial droop.  Imaging was significant for right MCA distribution stroke and approximately 75% right internal carotid artery stenosis.  Vascular surgery was consulted and carotid endarterectomy planned.  Prior to surgery, her facial droop resolved and her speech was clear.  She remained neurologically intact postop.  She continues on Plavix, aspirin and statin.  She presents today for incision check  No Known Allergies  Current Outpatient Medications  Medication Sig Dispense Refill  . acetaminophen (TYLENOL) 325 MG tablet Take 325 mg by mouth every 6 (six) hours as needed for moderate pain or headache.    . ALPRAZolam (XANAX) 0.5 MG tablet Take 1 tablet (0.5 mg total) by mouth 3 (three) times daily. 15 tablet 0  . ALPRAZolam (XANAX) 0.5 MG tablet Take 0.5 mg by mouth at bedtime. *May take one tablet three times daily as needed for anxiety**    . Ascorbic Acid (VITAMIN C WITH ROSE HIPS) 500 MG tablet Take 500 mg by mouth daily.    [name] aspirin 325 MG tablet Take 1 tablet (325 mg total) by mouth daily.    [name] aspirin EC 81 MG tablet Take 81 mg by mouth daily.     [name] atorvastatin (LIPITOR) 40 MG tablet Take 1 tablet (40 mg total) by mouth daily at 6 PM. 30 tablet 2  . clopidogrel (PLAVIX) 75 MG tablet Take 1 tablet (75 mg total) by mouth daily. 30 tablet 2  . glimepiride (AMARYL) 1 MG tablet Take 1 mg by mouth daily.     [name] glimepiride (AMARYL) 1 MG tablet Take 1 mg by mouth daily with breakfast.    . Glycerin-Hypromellose-PEG 400 (VISINE DRY EYE OP) Apply 1 drop to eye 3 (three) times daily as needed (for dry eye relief).     . metoprolol tartrate (LOPRESSOR) 25 MG tablet Take 1 tablet by mouth 2 (two) times daily.    .  multivitamin-lutein (OCUVITE-LUTEIN) CAPS capsule Take 1 capsule by mouth daily.    [name] omeprazole (PRILOSEC) 20 MG capsule Take 20 mg by mouth Daily.    . pantoprazole (PROTONIX) 40 MG tablet Take 1 tablet (40 mg total) by mouth daily. 30 tablet 2  . VITAMIN E PO Take 1 tablet by mouth daily.     No current facility-administered medications for this visit.     ROS:  See HPI  Physical Exam:  There were no vitals filed for this visit.  Incision:  *** Extremities:  *** Neuro: *** Abdomen:  ***  Assessment/Plan:  This is a 86 y.o. female who is s/p: ***  -***   Barbie Banner, PA-C [facility] [phone]  Clinic MD:  ***

## 2019-11-07 ENCOUNTER — Encounter: Payer: Medicare Other | Admitting: Vascular Surgery

## 2019-11-27 ENCOUNTER — Telehealth (HOSPITAL_COMMUNITY): Payer: Self-pay

## 2019-11-27 NOTE — Telephone Encounter (Signed)

## 2019-11-28 ENCOUNTER — Encounter: Payer: Self-pay | Admitting: Vascular Surgery

## 2019-11-28 ENCOUNTER — Other Ambulatory Visit: Payer: Self-pay

## 2019-11-28 ENCOUNTER — Ambulatory Visit (INDEPENDENT_AMBULATORY_CARE_PROVIDER_SITE_OTHER): Payer: Self-pay | Admitting: Vascular Surgery

## 2019-11-28 VITALS — BP 121/69 | HR 81 | Temp 97.7°F | Resp 20 | Ht 66.0 in | Wt 153.0 lb

## 2019-11-28 DIAGNOSIS — I6521 Occlusion and stenosis of right carotid artery: Secondary | ICD-10-CM

## 2019-11-28 NOTE — Progress Notes (Signed)
    Subjective:     Patient ID: Denise Parrish, female   DOB: [birth year], 86 y.o.   MRN: [id number]  HPI 86 year old female recently underwent right carotid endarterectomy for symptomatic disease.  She is now doing very well with some persistent swelling in her right neck.  Otherwise no complaints today.   Review of Systems Right neck swelling    Objective:   Physical Exam Today's Vitals   11/28/19 0846 11/28/19 0849  BP: 117/66 121/69  Pulse: 81   Resp: 20   Temp: 97.7 F (36.5 C)   SpO2: 97%   Weight: 153 lb (69.4 kg)   Height: 5' 6"  (1.676 m)    Awake alert oriented Cranial nerves intact Moving all extremities without limitation Right neck incision healing well     Assessment:     86 year old female status post right carotid endarterectomy now healing well.    Plan:     Follow-up 9 months with carotid duplex.  Margarito Dehaas C. Donzetta Matters, MD [facility] [place] Office: [phone] Pager: [phone]

## 2019-12-02 ENCOUNTER — Other Ambulatory Visit: Payer: Self-pay | Admitting: *Deleted

## 2019-12-02 DIAGNOSIS — I6521 Occlusion and stenosis of right carotid artery: Secondary | ICD-10-CM

## 2019-12-14 ENCOUNTER — Emergency Department (HOSPITAL_COMMUNITY): Payer: Medicare Other

## 2019-12-14 ENCOUNTER — Encounter (HOSPITAL_COMMUNITY): Payer: Self-pay

## 2019-12-14 ENCOUNTER — Emergency Department (HOSPITAL_COMMUNITY)
Admission: EM | Admit: 2019-12-14 | Discharge: 2019-12-14 | Disposition: A | Discharge status: Home / Self Care | Payer: Medicare Other | Attending: Emergency Medicine | Admitting: Emergency Medicine

## 2019-12-14 ENCOUNTER — Other Ambulatory Visit: Payer: Self-pay

## 2019-12-14 DIAGNOSIS — I10 Essential (primary) hypertension: Secondary | ICD-10-CM | POA: Diagnosis not present

## 2019-12-14 DIAGNOSIS — Z8673 Personal history of transient ischemic attack (TIA), and cerebral infarction without residual deficits: Secondary | ICD-10-CM | POA: Diagnosis not present

## 2019-12-14 DIAGNOSIS — Z79899 Other long term (current) drug therapy: Secondary | ICD-10-CM | POA: Diagnosis not present

## 2019-12-14 DIAGNOSIS — Z7901 Long term (current) use of anticoagulants: Secondary | ICD-10-CM | POA: Diagnosis not present

## 2019-12-14 DIAGNOSIS — E119 Type 2 diabetes mellitus without complications: Secondary | ICD-10-CM | POA: Diagnosis not present

## 2019-12-14 DIAGNOSIS — R42 Dizziness and giddiness: Secondary | ICD-10-CM

## 2019-12-14 DIAGNOSIS — Z96641 Presence of right artificial hip joint: Secondary | ICD-10-CM | POA: Insufficient documentation

## 2019-12-14 DIAGNOSIS — D649 Anemia, unspecified: Secondary | ICD-10-CM | POA: Diagnosis not present

## 2019-12-14 LAB — CBC
HCT: 30.7 % — ABNORMAL LOW (ref 36.0–46.0)
Hemoglobin: 9.5 g/dL — ABNORMAL LOW (ref 12.0–15.0)
MCH: 30.4 pg (ref 26.0–34.0)
MCHC: 30.9 g/dL (ref 30.0–36.0)
MCV: 98.4 fL (ref 80.0–100.0)
Platelets: 298 10*3/uL (ref 150–400)
RBC: 3.12 MIL/uL — ABNORMAL LOW (ref 3.87–5.11)
RDW: 14 % (ref 11.5–15.5)
WBC: 6.3 10*3/uL (ref 4.0–10.5)
nRBC: 0 % (ref 0.0–0.2)

## 2019-12-14 LAB — BASIC METABOLIC PANEL
Anion gap: 9 (ref 5–15)
BUN: 21 mg/dL (ref 8–23)
CO2: 26 mmol/L (ref 22–32)
Calcium: 8.6 mg/dL — ABNORMAL LOW (ref 8.9–10.3)
Chloride: 105 mmol/L (ref 98–111)
Creatinine, Ser: 0.98 mg/dL (ref 0.44–1.00)
GFR calc Af Amer: 57 mL/min — ABNORMAL LOW (ref >60)
GFR calc non Af Amer: 49 mL/min — ABNORMAL LOW (ref >60)
Glucose, Bld: 189 mg/dL — ABNORMAL HIGH (ref 70–99)
Potassium: 4.1 mmol/L (ref 3.5–5.1)
Sodium: 140 mmol/L (ref 135–145)

## 2019-12-14 LAB — POC OCCULT BLOOD, ED: Fecal Occult Bld: NEGATIVE

## 2019-12-14 MED ORDER — IOHEXOL 350 MG/ML SOLN
75.0000 mL | Freq: Once | INTRAVENOUS | Status: AC | PRN
  Administered 2019-12-14: 75 mL via INTRAVENOUS

## 2019-12-14 MED ORDER — MECLIZINE HCL 12.5 MG PO TABS: 12.5000 mg | ORAL_TABLET | Freq: Three times a day (TID) | ORAL | 0 refills | Status: DC | PRN

## 2019-12-14 NOTE — ED Notes (Signed)
Pt transferred from [facility] for mri

## 2019-12-14 NOTE — ED Provider Notes (Signed)
2:36 PM  BP 116/67   Pulse 69   Temp 97.9 F (36.6 C) (Oral)   Resp 18   Ht 5' 6"  (1.676 m)   Wt 73.9 kg   SpO2 100%   BMI 26.31 kg/m   History of 2 months of Vertigo Recent falls,  Endarterectomy and since that time, R sided endarterectomy. Awaiting MRI/.   Margarita Mail, PA-C 12/16/19 9483    Quintella Reichert, MD 12/16/19 [phone]

## 2019-12-14 NOTE — ED Notes (Signed)
Pt transported to MRI 

## 2019-12-14 NOTE — ED Notes (Signed)
Patient transported to CT 

## 2019-12-14 NOTE — ED Notes (Signed)
Patient verbalizes understanding of discharge instructions. Opportunity for questioning and answers were provided. Pt discharged from ED. 

## 2019-12-14 NOTE — ED Notes (Signed)
Pt ambulatory to and from restroom with steady gait

## 2019-12-14 NOTE — ED Triage Notes (Addendum)
Pt reports history of stroke in Jan.  And had carotid endarterectomy in Feb.  Reports has had numbness to r ear and neck area since the procedure.  Reports dizziness only when she lays down since yesterday morning at 8am.  Denies other symptoms.  Pt alert and oriented.  Pt reports some intermittent pain on r side of back.  Pt says feels like it is her lungs.  Reports fell recently and hit left side on the bathroom sink.

## 2019-12-14 NOTE — ED Notes (Signed)
Pt returned from MRI; daughter at bedside

## 2019-12-14 NOTE — Discharge Instructions (Addendum)
Your MRI shows evidence of your prior stroke but no acute appearing abnormalities which would explain your symptoms.  You are being prescribed a medication called meclizine to take as needed for your symptoms.  We also noted that you are anemic as compared to recent blood work.  I do not think that this is the cause of your symptoms though.  I would like you to follow-up with your PCP and have a repeat CBC done in the next week or so to further assess this.

## 2019-12-14 NOTE — ED Provider Notes (Signed)
MRI showing late evolving to chronic stroke.  No acute appearing abnormalities. Anemia noted.  Based on symptoms, this is likely not the etiology of her symptoms though.  Seems more consistent with vertigo.  Also more symptoms when laying still and improved when sitting up or standing which would argue against symptomatic anemia.  I would like her to follow-up this upcoming week for repeat CBC, particularly since she is on antiplatelet medication.  We will try meclizine as needed for symptoms otherwise.   Virgel Manifold, MD 12/14/19 1744

## 2019-12-14 NOTE — ED Provider Notes (Addendum)
[facility] Provider Note   CSN: [id number] Arrival date & time: 12/14/19  1041     History Chief Complaint  Patient presents with  . Dizziness    Denise Parrish is a 86 y.o. female.  HPI   This patient is a 86 year old female, she has a history of carotid artery stenosis on the right and required a recent endarterectomy after having a right MCA territory stroke approximately 2 months ago.  She was admitted to the hospital during that time that she had some slight facial weakness and numbness.  She reports that prior to having that stroke she had had several days of vertigo that was worse with movement.  She states that she has done very well on an aspirin that she was prescribed but over the last couple of days she has now again recurrently had vertigo which occurs when she rolls over in bed, occurs when she lays flat, goes away when she sits up or stands up.  She denies any numbness or weakness of the arms or the legs, there is no difficulty with speech, no acute changes in her vision.  Symptoms are intermittent, no fevers vomiting or diarrhea.  I reviewed the medical record including the patient's recent MRI results, she did have some chronic ischemic changes in the posterior occipital regions of the posterior circulation  Past Medical History:  Diagnosis Date  . Anxiety   . Aortic sclerosis    Long-standing heart murmur  . Carotid stenosis, right 10/16/2019   75% on CTA neck  . Chest pain 2006   +palpitations; minimal CAD at cath in 2005; normal EF; negative stress echo in 09/2008  . Cholelithiasis    on u/s 09/2011  . Degenerative joint disease    Right THA in 1995; cervical discectomy and fusion-2001  . Diabetes ([place])   . Diabetes mellitus, type 2 (HCC)    + neuropathy  . Emphysema   . Gastroesophageal reflux disease   . Hiatal hernia   . Hyperlipidemia   . Hypertension   . IDA (iron deficiency anemia)    labs 09/2011  . Mild cognitive  impairment   . Obesity   . Osteoporosis   . Pneumonia   . Sigmoid diverticulosis   . Small bowel lesion    On Given's capsule; Dr Kathleene Hazel 07/2012, no further w/u needed    Patient Active Problem List   Diagnosis Date Noted  . CVA (cerebral vascular accident) ([place]) 10/17/2019  . TIA (transient ischemic attack) 10/16/2019  . Slurred speech 10/16/2019  . Facial droop 10/16/2019  . Essential hypertension 10/16/2019  . Diabetes ([place]) 10/16/2019  . Stroke ([place]) 10/16/2019  . RUQ pain   . Dehydration   . Ileus ([place]) 12/12/2017  . Enteritis   . Right upper lobe pneumonia 12/08/2017  . Sepsis ([place]) 12/08/2017  . Lactic acidosis 12/08/2017  . Anxiety 12/08/2017  . Dehydration fever 12/08/2017  . Segmental ileitis of small intestine ([place]) 06/16/2015  . Diverticulosis of colon without hemorrhage 05/25/2015  . SBO (small bowel obstruction) ([place]) 05/23/2015  . Nausea and vomiting in adult   . Nausea vomiting and diarrhea 09/03/2013  . Vomiting 09/03/2013  . Transaminitis 09/03/2013  . Left leg weakness 10/14/2012  . Difficulty in walking(719.7) 10/14/2012  . Small bowel lesion 07/09/2012  . Heme positive stool 03/14/2012  . GERD (gastroesophageal reflux disease) 03/14/2012  . Diarrhea 03/14/2012  . Right carotid bruit 11/01/2011  . Anemia, iron deficiency 10/28/2011  . Palpitations  10/16/2011  . Diabetes mellitus, type 2 ([place])   . Hypertension   . Hyperlipidemia   . Obesity   . Aortic sclerosis     Past Surgical History:  Procedure Laterality Date  . ANTERIOR CERVICAL DISCECTOMY  2001   C4-5, allograft, fixation  . BREAST LUMPECTOMY     benign  . CATARACT EXTRACTION W/ INTRAOCULAR LENS IMPLANT  2007   Left  . COLONOSCOPY  2006  . COLONOSCOPY  04/03/12   Dr. Ok Edwards diverticulosis, negative microscopic  colitis   . ENDARTERECTOMY Right 10/21/2019   Procedure: ENDARTERECTOMY CAROTID RIGHT;  Surgeon: Waynetta Sandy, MD;  Location: [facility];  Service:  Vascular;  Laterality: Right;  . ESOPHAGOGASTRODUODENOSCOPY  04/03/12   Dr. Jennet Maduro hernia, chronic gastritis on bx  . GIVENS CAPSULE STUDY  05/09/2012   RMR: an unclear raised area of small bowel was noted, with features almost  characteristic of very small polyp. This was without villous  blunting or any evidence of active bleeding; yet, the area of  concern appeared to be erythematous. However, this could simply  be a light reflection on a normal variation of the small bowel. REFERRED TO DR. GILLIAM, APPT FOR NOVEMBER 2013.   [name] HIP ARTHROPLASTY Right   . PATCH ANGIOPLASTY Right 10/21/2019   Procedure: PATCH ANGIOPLASTY USING Rueben Bash BIOLOGIC PATCH;  Surgeon: Waynetta Sandy, MD;  Location: [facility];  Service: Vascular;  Laterality: Right;  . TONSILLECTOMY    . TOTAL HIP ARTHROPLASTY  1995   Right     OB History   No obstetric history on file.     Family History  Problem Relation Age of Onset  . Cirrhosis Mother        no etoh use  . CAD Father   . Diabetes Mother        + brother  . Heart failure Father   . Hypertension Father        + brother  . Cancer Brother        lung, age 51  . Uterine cancer Sister   . Colon cancer Neg Hx     Social History   Tobacco Use  . Smoking status: Never Smoker  . Smokeless tobacco: Never Used  . Tobacco comment: Quit x 50 years  Substance Use Topics  . Alcohol use: Never  . Drug use: Never    Home Medications Prior to Admission medications   Medication Sig Start Date End Date Taking? Authorizing Provider  acetaminophen (TYLENOL) 325 MG tablet Take 325 mg by mouth every 6 (six) hours as needed for moderate pain or headache.    [provider]  ALPRAZolam Duanne Moron) 0.5 MG tablet Take 1 tablet (0.5 mg total) by mouth 3 (three) times daily. 12/11/17   Barton Dubois, MD  Ascorbic Acid (VITAMIN C WITH ROSE HIPS) 500 MG tablet Take 500 mg by mouth daily.    [provider]  aspirin 325 MG tablet Take 1 tablet  (325 mg total) by mouth daily. 10/22/19   Georgette Shell, MD  atorvastatin (LIPITOR) 40 MG tablet Take 1 tablet (40 mg total) by mouth daily at 6 PM. 10/22/19   Georgette Shell, MD  clopidogrel (PLAVIX) 75 MG tablet Take 1 tablet (75 mg total) by mouth daily. 10/23/19   Georgette Shell, MD  glimepiride (AMARYL) 1 MG tablet Take 1 mg by mouth daily with breakfast.    [provider]  Glycerin-Hypromellose-PEG 400 (VISINE DRY EYE OP) Apply 1 drop  to eye 3 (three) times daily as needed (for dry eye relief).     [provider]  metoprolol tartrate (LOPRESSOR) 25 MG tablet Take 1 tablet by mouth 2 (two) times daily. 07/27/16   [provider]  multivitamin-lutein (OCUVITE-LUTEIN) CAPS capsule Take 1 capsule by mouth daily.    [provider]  omeprazole (PRILOSEC) 20 MG capsule Take 20 mg by mouth Daily. 08/05/12   [provider]  pantoprazole (PROTONIX) 40 MG tablet Take 1 tablet (40 mg total) by mouth daily. 10/23/19   Georgette Shell, MD  VITAMIN E PO Take 1 tablet by mouth daily.    [provider]    Allergies    Patient has no known allergies.  Review of Systems   Review of Systems  All other systems reviewed and are negative.   Physical Exam Updated Vital Signs BP (!) 110/58   Pulse 76   Temp 97.9 F (36.6 C) (Oral)   Resp 14   Ht 1.676 m (5' 6" )   Wt 73.9 kg   SpO2 95%   BMI 26.31 kg/m   Physical Exam Vitals and nursing note reviewed.  Constitutional:      General: She is not in acute distress.    Appearance: She is well-developed.  HENT:     Head: Normocephalic and atraumatic.     Ears:     Comments: Hearing aids removed, bilateral tympanic membranes are clear bilaterally    Mouth/Throat:     Pharynx: No oropharyngeal exudate.  Eyes:     General: No scleral icterus.       Right eye: No discharge.        Left eye: No discharge.     Conjunctiva/sclera: Conjunctivae normal.     Pupils: Pupils are  equal, round, and reactive to light.  Neck:     Thyroid: No thyromegaly.     Vascular: No JVD.     Comments: No carotid bruits auscultated Cardiovascular:     Rate and Rhythm: Normal rate and regular rhythm.     Heart sounds: Normal heart sounds. No murmur. No friction rub. No gallop.   Pulmonary:     Effort: Pulmonary effort is normal. No respiratory distress.     Breath sounds: Normal breath sounds. No wheezing or rales.  Abdominal:     General: Bowel sounds are normal. There is no distension.     Palpations: Abdomen is soft. There is no mass.     Tenderness: There is no abdominal tenderness.  Musculoskeletal:        General: No tenderness. Normal range of motion.     Cervical back: Normal range of motion and neck supple.  Lymphadenopathy:     Cervical: No cervical adenopathy.  Skin:    General: Skin is warm and dry.     Findings: No erythema or rash.  Neurological:     Mental Status: She is alert.     Coordination: Coordination normal.     Comments: The patient has normal strength and sensation diffusely except for the right side of the face which has decreased sensation but no facial droop.  Her visual acuity is not normal but this patient has known macular degeneration on the left and decreased vision on the right ever since her stroke.  Nothing is changed.  Normal extraocular movements and pupillary exams.  Normal strength in all 4 extremities at the grips as well as the proximal flexors and extensors.  She is able to straight  leg raise bilaterally with 5 out of 5 strength.  She has normal finger-nose-finger with normal coordination and speech.  Psychiatric:        Behavior: Behavior normal.     ED Results / Procedures / Treatments   Labs (all labs ordered are listed, but only abnormal results are displayed) Labs Reviewed  BASIC METABOLIC PANEL - Abnormal; Notable for the following components:      Result Value   Glucose, Bld 189 (*)    Calcium 8.6 (*)    GFR calc non Af  Amer 49 (*)    GFR calc Af Amer 57 (*)    All other components within normal limits  CBC - Abnormal; Notable for the following components:   RBC 3.12 (*)    Hemoglobin 9.5 (*)    HCT 30.7 (*)    All other components within normal limits  POC OCCULT BLOOD, ED    EKG EKG Interpretation  Date/Time:  Sunday December 14 2019 10:54:49 EDT Ventricular Rate:  78 PR Interval:    QRS Duration: 90 QT Interval:  382 QTC Calculation: 436 R Axis:   40 Text Interpretation: Sinus or ectopic atrial rhythm Prolonged PR interval Low voltage, precordial leads Minimal ST depression, inferior leads since last tracing no significant change Confirmed by Noemi Chapel [phone]) on 12/14/2019 11:49:19 AM   Radiology CT Angio Head W or Wo Contrast  Result Date: 12/14/2019 CLINICAL DATA:  Central vertigo. Right carotid endarterectomy 10/21/2019 EXAM: CT ANGIOGRAPHY HEAD AND NECK TECHNIQUE: Multidetector CT imaging of the head and neck was performed using the standard protocol during bolus administration of intravenous contrast. Multiplanar CT image reconstructions and MIPs were obtained to evaluate the vascular anatomy. Carotid stenosis measurements (when applicable) are obtained utilizing NASCET criteria, using the distal internal carotid diameter as the denominator. CONTRAST:  52m OMNIPAQUE IOHEXOL 350 MG/ML SOLN COMPARISON:  CT angio head and neck 10/16/2019 FINDINGS: CT HEAD FINDINGS Brain: Moderate atrophy. Negative for hydrocephalus. Extensive chronic microvascular ischemic changes in the white matter and internal capsule bilaterally, stable. Small chronic infarct left lateral cerebellum unchanged. No acute infarct, hemorrhage, mass. Vascular: Negative for hyperdense vessel Skull: Negative Sinuses: Paranasal sinuses clear. Orbits: Bilateral cataract extraction.  No orbital mass. Review of the MIP images confirms the above findings CTA NECK FINDINGS Aortic arch: Standard branching. Imaged portion shows no evidence of  aneurysm or dissection. No significant stenosis of the major arch vessel origins. Mild atherosclerotic disease in the aortic arch and proximal great vessels. Right carotid system: Postop right carotid endarterectomy. Patch angioplasty. Widely patent surgical site. No thrombus or dissection identified. No significant residual stenosis. Left carotid system: Mild atherosclerotic calcification left carotid bifurcation without significant stenosis. No change from the prior study. Vertebral arteries: Both vertebral arteries are widely patent without significant stenosis. Left vertebral artery is dominant. Skeleton: Cervical spondylosis. ACDF with solid fusion C4-5. No acute skeletal abnormality. Other neck: Negative for mass or adenopathy in the neck. Upper chest: Apical scarring bilaterally.  No acute abnormality. Review of the MIP images confirms the above findings CTA HEAD FINDINGS Anterior circulation: Mild atherosclerotic calcification left cavernous carotid. No significant calcification in the right cavernous carotid. No significant carotid stenosis. Anterior and middle cerebral arteries patent bilaterally without stenosis. Posterior circulation: Both vertebral arteries patent to the basilar. PICA patent bilaterally. Basilar widely patent. Superior cerebellar and posterior cerebral arteries patent bilaterally. Fetal origin of the right posterior cerebral artery. Venous sinuses: Normal venous enhancement. Anatomic variants: None Review of the MIP images  confirms the above findings IMPRESSION: 1. Atrophy and extensive chronic microvascular ischemic change in the white matter. No acute intracranial abnormality. 2. Negative for intracranial large vessel occlusion or significant stenosis 3. Postop recent right carotid endarterectomy which is widely patent. Electronically Signed   By: Franchot Gallo M.D.   On: 12/14/2019 13:02   CT Angio Neck W and/or Wo Contrast  Result Date: 12/14/2019 CLINICAL DATA:  Central  vertigo. Right carotid endarterectomy 10/21/2019 EXAM: CT ANGIOGRAPHY HEAD AND NECK TECHNIQUE: Multidetector CT imaging of the head and neck was performed using the standard protocol during bolus administration of intravenous contrast. Multiplanar CT image reconstructions and MIPs were obtained to evaluate the vascular anatomy. Carotid stenosis measurements (when applicable) are obtained utilizing NASCET criteria, using the distal internal carotid diameter as the denominator. CONTRAST:  28m OMNIPAQUE IOHEXOL 350 MG/ML SOLN COMPARISON:  CT angio head and neck 10/16/2019 FINDINGS: CT HEAD FINDINGS Brain: Moderate atrophy. Negative for hydrocephalus. Extensive chronic microvascular ischemic changes in the white matter and internal capsule bilaterally, stable. Small chronic infarct left lateral cerebellum unchanged. No acute infarct, hemorrhage, mass. Vascular: Negative for hyperdense vessel Skull: Negative Sinuses: Paranasal sinuses clear. Orbits: Bilateral cataract extraction.  No orbital mass. Review of the MIP images confirms the above findings CTA NECK FINDINGS Aortic arch: Standard branching. Imaged portion shows no evidence of aneurysm or dissection. No significant stenosis of the major arch vessel origins. Mild atherosclerotic disease in the aortic arch and proximal great vessels. Right carotid system: Postop right carotid endarterectomy. Patch angioplasty. Widely patent surgical site. No thrombus or dissection identified. No significant residual stenosis. Left carotid system: Mild atherosclerotic calcification left carotid bifurcation without significant stenosis. No change from the prior study. Vertebral arteries: Both vertebral arteries are widely patent without significant stenosis. Left vertebral artery is dominant. Skeleton: Cervical spondylosis. ACDF with solid fusion C4-5. No acute skeletal abnormality. Other neck: Negative for mass or adenopathy in the neck. Upper chest: Apical scarring bilaterally.   No acute abnormality. Review of the MIP images confirms the above findings CTA HEAD FINDINGS Anterior circulation: Mild atherosclerotic calcification left cavernous carotid. No significant calcification in the right cavernous carotid. No significant carotid stenosis. Anterior and middle cerebral arteries patent bilaterally without stenosis. Posterior circulation: Both vertebral arteries patent to the basilar. PICA patent bilaterally. Basilar widely patent. Superior cerebellar and posterior cerebral arteries patent bilaterally. Fetal origin of the right posterior cerebral artery. Venous sinuses: Normal venous enhancement. Anatomic variants: None Review of the MIP images confirms the above findings IMPRESSION: 1. Atrophy and extensive chronic microvascular ischemic change in the white matter. No acute intracranial abnormality. 2. Negative for intracranial large vessel occlusion or significant stenosis 3. Postop recent right carotid endarterectomy which is widely patent. Electronically Signed   By: CFranchot GalloM.D.   On: 12/14/2019 13:02    Procedures Procedures (including critical care time)  Medications Ordered in ED Medications  iohexol (OMNIPAQUE) 350 MG/ML injection 75 mL (75 mLs Intravenous Contrast Given 12/14/19 1226)    ED Course  I have reviewed the triage vital signs and the nursing notes.  Pertinent labs & imaging results that were available during my care of the patient were reviewed by me and considered in my medical decision making (see chart for details).    MDM Rules/Calculators/A&P                      I do not detect any focal neurologic exams.  The patient has reproducible vertigo with position  making me think this is peripheral however given that this preceded her prior stroke we will discuss with neurology.  I do not feel the need to do an acute CT scan of the patient's brain given no other focal neurologic deficits, will obtain a basic metabolic panel and consult with neuro  hospitalist  I discussed care with the neuro hospitalist who recommends that the patient be sent to [facility] emergency department for evaluation by MRI.  He wants the CT angio done first, he thinks that based on patient's prior radiographic anatomy and current vertiginous symptoms that there could be some correlation.  I agree with this and at this time the patient will undergo labs and CT scan with transfer to [facility] emergency department for MRI.  The patient would need to be admitted if the MRI was positive for new ischemia, she can go home if negative.  Hemoccult pending, discussed with [facility] Ralene Bathe at [facility] who has accepted care of the patient to the emergency department.  I did the hemoccult myself - the brown stool is neg for blood - the pt does admit to intermittent black stools over the last month but non in the last 2 weeks.  Final Clinical Impression(s) / ED Diagnoses Final diagnoses:  Anemia, unspecified type  Vertigo    Rx / DC Orders ED Discharge Orders    None       Noemi Chapel, MD 12/14/19 1315    Noemi Chapel, MD 12/14/19 1322

## 2019-12-14 NOTE — ED Notes (Signed)
Report given to [facility] 

## 2020-04-07 ENCOUNTER — Observation Stay (HOSPITAL_COMMUNITY): Payer: Medicare Other

## 2020-04-07 ENCOUNTER — Emergency Department (HOSPITAL_COMMUNITY): Payer: Medicare Other

## 2020-04-07 ENCOUNTER — Encounter (HOSPITAL_COMMUNITY): Payer: Self-pay

## 2020-04-07 ENCOUNTER — Other Ambulatory Visit: Payer: Self-pay

## 2020-04-07 ENCOUNTER — Observation Stay (HOSPITAL_COMMUNITY)
Admission: EM | Admit: 2020-04-07 | Discharge: 2020-04-08 | Disposition: A | Discharge status: Home / Self Care | Payer: Medicare Other | Attending: Internal Medicine | Admitting: Internal Medicine

## 2020-04-07 DIAGNOSIS — I10 Essential (primary) hypertension: Secondary | ICD-10-CM | POA: Diagnosis present

## 2020-04-07 DIAGNOSIS — I639 Cerebral infarction, unspecified: Principal | ICD-10-CM | POA: Insufficient documentation

## 2020-04-07 DIAGNOSIS — Z20822 Contact with and (suspected) exposure to covid-19: Secondary | ICD-10-CM | POA: Insufficient documentation

## 2020-04-07 DIAGNOSIS — E1169 Type 2 diabetes mellitus with other specified complication: Secondary | ICD-10-CM | POA: Diagnosis not present

## 2020-04-07 DIAGNOSIS — I6781 Acute cerebrovascular insufficiency: Secondary | ICD-10-CM | POA: Diagnosis not present

## 2020-04-07 DIAGNOSIS — Z96641 Presence of right artificial hip joint: Secondary | ICD-10-CM | POA: Diagnosis not present

## 2020-04-07 DIAGNOSIS — R29818 Other symptoms and signs involving the nervous system: Secondary | ICD-10-CM | POA: Diagnosis not present

## 2020-04-07 DIAGNOSIS — Z7982 Long term (current) use of aspirin: Secondary | ICD-10-CM | POA: Insufficient documentation

## 2020-04-07 DIAGNOSIS — E119 Type 2 diabetes mellitus without complications: Secondary | ICD-10-CM

## 2020-04-07 DIAGNOSIS — Z95820 Peripheral vascular angioplasty status with implants and grafts: Secondary | ICD-10-CM | POA: Diagnosis not present

## 2020-04-07 DIAGNOSIS — Z7984 Long term (current) use of oral hypoglycemic drugs: Secondary | ICD-10-CM | POA: Diagnosis not present

## 2020-04-07 DIAGNOSIS — E785 Hyperlipidemia, unspecified: Secondary | ICD-10-CM | POA: Diagnosis present

## 2020-04-07 DIAGNOSIS — R29810 Facial weakness: Secondary | ICD-10-CM | POA: Diagnosis present

## 2020-04-07 DIAGNOSIS — K219 Gastro-esophageal reflux disease without esophagitis: Secondary | ICD-10-CM | POA: Insufficient documentation

## 2020-04-07 DIAGNOSIS — Z79899 Other long term (current) drug therapy: Secondary | ICD-10-CM | POA: Diagnosis not present

## 2020-04-07 LAB — DIFFERENTIAL
Abs Immature Granulocytes: 0.02 10*3/uL (ref 0.00–0.07)
Basophils Absolute: 0 10*3/uL (ref 0.0–0.1)
Basophils Relative: 0 %
Eosinophils Absolute: 0.2 10*3/uL (ref 0.0–0.5)
Eosinophils Relative: 3 %
Immature Granulocytes: 0 %
Lymphocytes Relative: 33 %
Lymphs Abs: 2 10*3/uL (ref 0.7–4.0)
Monocytes Absolute: 0.7 10*3/uL (ref 0.1–1.0)
Monocytes Relative: 12 %
Neutro Abs: 3.1 10*3/uL (ref 1.7–7.7)
Neutrophils Relative %: 52 %

## 2020-04-07 LAB — COMPREHENSIVE METABOLIC PANEL
ALT: 22 U/L (ref 0–44)
AST: 26 U/L (ref 15–41)
Albumin: 3.7 g/dL (ref 3.5–5.0)
Alkaline Phosphatase: 79 U/L (ref 38–126)
Anion gap: 10 (ref 5–15)
BUN: 19 mg/dL (ref 8–23)
CO2: 25 mmol/L (ref 22–32)
Calcium: 9.4 mg/dL (ref 8.9–10.3)
Chloride: 102 mmol/L (ref 98–111)
Creatinine, Ser: 1.06 mg/dL — ABNORMAL HIGH (ref 0.44–1.00)
GFR calc Af Amer: 52 mL/min — ABNORMAL LOW (ref >60)
GFR calc non Af Amer: 45 mL/min — ABNORMAL LOW (ref >60)
Glucose, Bld: 106 mg/dL — ABNORMAL HIGH (ref 70–99)
Potassium: 4.1 mmol/L (ref 3.5–5.1)
Sodium: 137 mmol/L (ref 135–145)
Total Bilirubin: 1.2 mg/dL (ref 0.3–1.2)
Total Protein: 6.9 g/dL (ref 6.5–8.1)

## 2020-04-07 LAB — APTT: aPTT: 28 seconds (ref 24–36)

## 2020-04-07 LAB — ETHANOL: Alcohol, Ethyl (B): <10 mg/dL (ref <10)

## 2020-04-07 LAB — I-STAT CHEM 8, ED
BUN: 18 mg/dL (ref 8–23)
Calcium, Ion: 1.24 mmol/L (ref 1.15–1.40)
Chloride: 101 mmol/L (ref 98–111)
Creatinine, Ser: 1.2 mg/dL — ABNORMAL HIGH (ref 0.44–1.00)
Glucose, Bld: 106 mg/dL — ABNORMAL HIGH (ref 70–99)
HCT: 35 % — ABNORMAL LOW (ref 36.0–46.0)
Hemoglobin: 11.9 g/dL — ABNORMAL LOW (ref 12.0–15.0)
Potassium: 4 mmol/L (ref 3.5–5.1)
Sodium: 140 mmol/L (ref 135–145)
TCO2: 27 mmol/L (ref 22–32)

## 2020-04-07 LAB — CBC
HCT: 34.8 % — ABNORMAL LOW (ref 36.0–46.0)
Hemoglobin: 11.1 g/dL — ABNORMAL LOW (ref 12.0–15.0)
MCH: 27.7 pg (ref 26.0–34.0)
MCHC: 31.9 g/dL (ref 30.0–36.0)
MCV: 86.8 fL (ref 80.0–100.0)
Platelets: 269 10*3/uL (ref 150–400)
RBC: 4.01 MIL/uL (ref 3.87–5.11)
RDW: 17.1 % — ABNORMAL HIGH (ref 11.5–15.5)
WBC: 6.1 10*3/uL (ref 4.0–10.5)
nRBC: 0 % (ref 0.0–0.2)

## 2020-04-07 LAB — GLUCOSE, CAPILLARY: Glucose-Capillary: 153 mg/dL — ABNORMAL HIGH (ref 70–99)

## 2020-04-07 LAB — PROTIME-INR
INR: 1 (ref 0.8–1.2)
Prothrombin Time: 13.2 seconds (ref 11.4–15.2)

## 2020-04-07 LAB — CBG MONITORING, ED: Glucose-Capillary: 87 mg/dL (ref 70–99)

## 2020-04-07 LAB — SARS CORONAVIRUS 2 BY RT PCR (HOSPITAL ORDER, PERFORMED IN ~~LOC~~ HOSPITAL LAB): SARS Coronavirus 2: NEGATIVE

## 2020-04-07 MED ORDER — ATORVASTATIN CALCIUM 40 MG PO TABS
40.0000 mg | ORAL_TABLET | Freq: Every day | ORAL | Status: DC
  Administered 2020-04-07: 40 mg via ORAL
  Filled 2020-04-07: qty 1

## 2020-04-07 MED ORDER — HEPARIN SODIUM (PORCINE) 5000 UNIT/ML IJ SOLN: 5000.0000 [IU] | Freq: Three times a day (TID) | INTRAMUSCULAR | Status: DC

## 2020-04-07 MED ORDER — GLIMEPIRIDE 2 MG PO TABS
1.0000 mg | ORAL_TABLET | Freq: Every day | ORAL | Status: DC
  Administered 2020-04-08: 1 mg via ORAL
  Filled 2020-04-07: qty 1

## 2020-04-07 MED ORDER — ACETAMINOPHEN 650 MG RE SUPP: 650.0000 mg | RECTAL | Status: DC | PRN

## 2020-04-07 MED ORDER — ACETAMINOPHEN 160 MG/5ML PO SOLN: 650.0000 mg | ORAL | Status: DC | PRN

## 2020-04-07 MED ORDER — ACETAMINOPHEN 325 MG PO TABS: 650.0000 mg | ORAL_TABLET | ORAL | Status: DC | PRN

## 2020-04-07 MED ORDER — ASPIRIN 325 MG PO TABS
325.0000 mg | ORAL_TABLET | Freq: Every day | ORAL | Status: DC
  Administered 2020-04-08: 325 mg via ORAL
  Filled 2020-04-07: qty 1

## 2020-04-07 MED ORDER — ALPRAZOLAM 0.5 MG PO TABS: 0.5000 mg | ORAL_TABLET | Freq: Every evening | ORAL | Status: DC | PRN

## 2020-04-07 MED ORDER — STROKE: EARLY STAGES OF RECOVERY BOOK: Freq: Once | Status: AC

## 2020-04-07 MED ORDER — CLOPIDOGREL BISULFATE 75 MG PO TABS
75.0000 mg | ORAL_TABLET | Freq: Every day | ORAL | Status: DC
  Administered 2020-04-07 – 2020-04-08 (×2): 75 mg via ORAL
  Filled 2020-04-07 (×2): qty 1

## 2020-04-07 MED ORDER — PANTOPRAZOLE SODIUM 40 MG PO TBEC
40.0000 mg | DELAYED_RELEASE_TABLET | Freq: Every day | ORAL | Status: DC
  Administered 2020-04-07 – 2020-04-08 (×2): 40 mg via ORAL
  Filled 2020-04-07 (×2): qty 1

## 2020-04-07 MED ORDER — ACETAMINOPHEN 325 MG PO TABS: 325.0000 mg | ORAL_TABLET | Freq: Four times a day (QID) | ORAL | Status: DC | PRN

## 2020-04-07 MED ORDER — IOHEXOL 350 MG/ML SOLN
100.0000 mL | Freq: Once | INTRAVENOUS | Status: AC | PRN
  Administered 2020-04-07: 75 mL via INTRAVENOUS

## 2020-04-07 NOTE — Progress Notes (Signed)
Patient arrived to unit via wheelchair.

## 2020-04-07 NOTE — H&P (Signed)
TRH H&P   Patient Demographics:    Denise Parrish, is a 86 y.o. female  MRN: [id number]   DOB - December 26, 1922  Admit Date - 04/07/2020  Outpatient Primary MD for the patient is [name], MD  Referring MD/NP/PA: Dr Sabra Heck  Patient coming from: home  Chief Complaint  Patient presents with  . Code Stroke      HPI:    Denise Parrish  is a 86 y.o. 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, with no residual deficits, secondary to carotid artery stenosis, status post CEA 10/21/2019, remains on aspirin, statin and Plavix, diabetes mellitus, GERD, hypertension, patient presents to ED secondary to complaints of facial numbness and facial weakness, Patient did have left facial droop, and slurred speech this morning, it did happen today around at 3 PM, as well she did report some difficulty swallowing, which prompted her to come to ED, patient denies any extremities tingling, or focal weakness, she denies any headache, fever, chills, any dysuria or polyuria ports she has been compliant with her aspirin, Plavix and  statin which she took this morning. -In ED patient was seen by teleneurology, with CTA head and neck was obtained, which did show Right carotid  endarterectomy is widely patent, and no intracranial large vessel occlusions or significant stenosis.  Teleneurology recommended admission for observation and MRI brain, [facility] consulted to admit   Review of systems:    In addition to the HPI above, No Fever-chills, No Headache, No changes with Vision or hearing, No problems swallowing food or Liquids, No Chest pain, Cough or Shortness of Breath, No Abdominal pain, No Nausea or Vommitting, Bowel movements are regular, No Blood in stool or Urine, No dysuria, No new skin rashes or bruises, No new joints pains-aches,  Patient reports new left facial  droop and numbness No recent weight gain or loss, No polyuria, polydypsia or polyphagia, No significant Mental Stressors.  A full 10 point Review of Systems was done, except as stated above, all other Review of Systems were negative.   With Past History of the following :    Past Medical History:  Diagnosis Date  . Anxiety   . Aortic sclerosis    Long-standing heart murmur  . Carotid stenosis, right 10/16/2019   75% on CTA neck  . Chest pain 2006   +palpitations; minimal CAD at cath in 2005; normal EF; negative stress echo in 09/2008  . Cholelithiasis    on u/s 09/2011  . Degenerative joint disease    Right THA in 1995; cervical discectomy and fusion-2001  . Diabetes ([place])   . Diabetes mellitus, type 2 (HCC)    + neuropathy  . Emphysema   . Gastroesophageal reflux disease   . Hiatal hernia   . Hyperlipidemia   . Hypertension   . IDA (iron deficiency anemia)    labs 09/2011  .  Mild cognitive impairment   . Obesity   . Osteoporosis   . Pneumonia   . Sigmoid diverticulosis   . Small bowel lesion    On Given's capsule; Dr Kathleene Hazel 07/2012, no further w/u needed  . Stroke [facility])       Past Surgical History:  Procedure Laterality Date  . ANTERIOR CERVICAL DISCECTOMY  2001   C4-5, allograft, fixation  . BREAST LUMPECTOMY     benign  . CATARACT EXTRACTION W/ INTRAOCULAR LENS IMPLANT  2007   Left  . COLONOSCOPY  2006  . COLONOSCOPY  04/03/12   Dr. Ok Edwards diverticulosis, negative microscopic  colitis   . ENDARTERECTOMY Right 10/21/2019   Procedure: ENDARTERECTOMY CAROTID RIGHT;  Surgeon: Waynetta Sandy, MD;  Location: [place];  Service: Vascular;  Laterality: Right;  . ESOPHAGOGASTRODUODENOSCOPY  04/03/12   Dr. Jennet Maduro hernia, chronic gastritis on bx  . GIVENS CAPSULE STUDY  05/09/2012   RMR: an unclear raised area of small bowel was noted, with features almost  characteristic of very small polyp. This was without villous  blunting or any evidence  of active bleeding; yet, the area of  concern appeared to be erythematous. However, this could simply  be a light reflection on a normal variation of the small bowel. REFERRED TO DR. GILLIAM, APPT FOR NOVEMBER 2013.   [name] HIP ARTHROPLASTY Right   . PATCH ANGIOPLASTY Right 10/21/2019   Procedure: PATCH ANGIOPLASTY USING Rueben Bash BIOLOGIC PATCH;  Surgeon: Waynetta Sandy, MD;  Location: [place];  Service: Vascular;  Laterality: Right;  . TONSILLECTOMY    . TOTAL HIP ARTHROPLASTY  1995   Right      Social History:     Social History   Tobacco Use  . Smoking status: Never Smoker  . Smokeless tobacco: Never Used  . Tobacco comment: Quit x 50 years  Substance Use Topics  . Alcohol use: Never       Family History :     Family History  Problem Relation Age of Onset  . Cirrhosis Mother        no etoh use  . CAD Father   . Diabetes Mother        + brother  . Heart failure Father   . Hypertension Father        + brother  . Cancer Brother        lung, age 91  . Uterine cancer Sister   . Colon cancer Neg Hx      Home Medications:   Prior to Admission medications   Medication Sig Start Date End Date Taking? Authorizing Provider  acetaminophen (TYLENOL) 325 MG tablet Take 325 mg by mouth every 6 (six) hours as needed for moderate pain or headache.    [provider]  ALPRAZolam Duanne Moron) 0.5 MG tablet Take 1 tablet (0.5 mg total) by mouth 3 (three) times daily. Patient taking differently: Take 0.5 mg by mouth at bedtime as needed. Occasionally 1 tablet during the day if needed. 12/11/17   Barton Dubois, MD  Ascorbic Acid (VITAMIN C WITH ROSE HIPS) 500 MG tablet Take 500 mg by mouth daily.    [provider]  aspirin 325 MG tablet Take 1 tablet (325 mg total) by mouth daily. 10/22/19   Georgette Shell, MD  atorvastatin (LIPITOR) 40 MG tablet Take 1 tablet (40 mg total) by mouth daily at 6 PM. 10/22/19   Georgette Shell, MD  clopidogrel (PLAVIX) 75 MG  tablet Take 1 tablet (  75 mg total) by mouth daily. 10/23/19   Georgette Shell, MD  glimepiride (AMARYL) 1 MG tablet Take 1 mg by mouth daily with breakfast.    [provider]  Glycerin-Hypromellose-PEG 400 (VISINE DRY EYE OP) Apply 1 drop to eye 3 (three) times daily as needed (for dry eye relief).     [provider]  meclizine (ANTIVERT) 12.5 MG tablet Take 1 tablet (12.5 mg total) by mouth 3 (three) times daily as needed for dizziness. 12/14/19   Virgel Manifold, MD  metoprolol tartrate (LOPRESSOR) 25 MG tablet Take 1 tablet by mouth 2 (two) times daily. 07/27/16   [provider]  multivitamin-lutein (OCUVITE-LUTEIN) CAPS capsule Take 1 capsule by mouth daily.    [provider]  omeprazole (PRILOSEC) 20 MG capsule Take 20 mg by mouth Daily. 08/05/12   [provider]  pantoprazole (PROTONIX) 40 MG tablet Take 1 tablet (40 mg total) by mouth daily. 10/23/19   Georgette Shell, MD  VITAMIN E PO Take 1 tablet by mouth daily.    [provider]     Allergies:    No Known Allergies   Physical Exam:   Vitals  Blood pressure (!) 161/73, pulse 62, temperature 98.7 F (37.1 C), temperature source Oral, resp. rate 10, height 5' 5"  (1.651 m), weight 75.3 kg, SpO2 98 %.   1. General developed elderly female, laying in bed, no apparent distress  2. Normal affect and insight, Not Suicidal or Homicidal, Awake Alert, Oriented X 3.  3. No F.N deficits, and does have mild left facial droop, but speech is intact, mentation is appropriate, Strength 5/5 all 4 extremities, Sensation intact all 4 extremities, Plantars down going.  4. Ears and Eyes appear Normal, Conjunctivae clear, PERRLA. Moist Oral Mucosa.  5. Supple Neck, No JVD, No cervical lymphadenopathy appriciated, No Carotid Bruits.  6. Symmetrical Chest wall movement, Good air movement bilaterally, CTAB.  7. RRR, No Gallops, Rubs or Murmurs, No Parasternal Heave.  8. Positive Bowel  Sounds, Abdomen Soft, No tenderness, No organomegaly appriciated,No rebound -guarding or rigidity.  9.  No Cyanosis, Normal Skin Turgor, No Skin Rash or Bruise.  10. Good muscle tone,  joints appear normal , no effusions, Normal ROM.  11. No Palpable Lymph Nodes in Neck or Axillae     Data Review:    CBC Recent Labs  Lab 04/07/20 1615 04/07/20 1618  WBC 6.1  --   HGB 11.1* 11.9*  HCT 34.8* 35.0*  PLT 269  --   MCV 86.8  --   MCH 27.7  --   MCHC 31.9  --   RDW 17.1*  --   LYMPHSABS 2.0  --   MONOABS 0.7  --   EOSABS 0.2  --   BASOSABS 0.0  --    ------------------------------------------------------------------------------------------------------------------  Chemistries  Recent Labs  Lab 04/07/20 1615 04/07/20 1618  NA 137 140  K 4.1 4.0  CL 102 101  CO2 25  --   GLUCOSE 106* 106*  BUN 19 18  CREATININE 1.06* 1.20*  CALCIUM 9.4  --   AST 26  --   ALT 22  --   ALKPHOS 79  --   BILITOT 1.2  --    ------------------------------------------------------------------------------------------------------------------ estimated creatinine clearance is 28.5 mL/min (A) (by C-G formula based on SCr of 1.2 mg/dL (H)). ------------------------------------------------------------------------------------------------------------------ No results for input(s): TSH, T4TOTAL, T3FREE, THYROIDAB in the last 72 hours.  Invalid input(s): FREET3  Coagulation profile Recent Labs  Lab 04/07/20  1615  INR 1.0   ------------------------------------------------------------------------------------------------------------------- No results for input(s): DDIMER in the last 72 hours. -------------------------------------------------------------------------------------------------------------------  Cardiac Enzymes No results for input(s): CKMB, TROPONINI, MYOGLOBIN in the last 168 hours.  Invalid input(s):  CK ------------------------------------------------------------------------------------------------------------------ No results found for: BNP   ---------------------------------------------------------------------------------------------------------------  Urinalysis    Component Value Date/Time   COLORURINE COLORLESS (A) 10/16/2019 2037   APPEARANCEUR CLEAR 10/16/2019 2037   LABSPEC 1.015 10/16/2019 2037   PHURINE 6.0 10/16/2019 2037   [place] NEGATIVE 10/16/2019 2037   [place] NEGATIVE 10/16/2019 2037   [place] NEGATIVE 10/16/2019 2037   [place] NEGATIVE 10/16/2019 2037   PROTEINUR NEGATIVE 10/16/2019 2037   UROBILINOGEN 1.0 05/23/2015 1956   NITRITE NEGATIVE 10/16/2019 2037   LEUKOCYTESUR NEGATIVE 10/16/2019 2037    ----------------------------------------------------------------------------------------------------------------   Imaging Results:    CT Angio Head W or Wo Contrast  Result Date: 04/07/2020 CLINICAL DATA:  Acute neuro deficit.  Left facial droop. EXAM: CT ANGIOGRAPHY HEAD AND NECK TECHNIQUE: Multidetector CT imaging of the head and neck was performed using the standard protocol during bolus administration of intravenous contrast. Multiplanar CT image reconstructions and MIPs were obtained to evaluate the vascular anatomy. Carotid stenosis measurements (when applicable) are obtained utilizing NASCET criteria, using the distal internal carotid diameter as the denominator. CONTRAST:  74m OMNIPAQUE IOHEXOL 350 MG/ML SOLN COMPARISON:  CT head 04/07/2020.  MRI 12/10/2019.  CTA 12/14/2019 FINDINGS: CTA NECK FINDINGS Aortic arch: Atherosclerotic calcification in the aortic arch. Proximal great vessels widely patent without stenosis. Right carotid system: Postop right carotid endarterectomy. No significant stenosis, pseudoaneurysm or thrombus. No significant stenosis. Left carotid system: Atherosclerotic calcification left carotid bifurcation without significant stenosis.  Vertebral arteries: Both vertebral arteries widely patent to the basilar. Left vertebral artery is dominant. Skeleton: ACDF with solid fusion C4-5. No acute skeletal abnormality. Multilevel degenerative change. Other neck: No mass, adenopathy, or fluid collection in the neck. Upper chest: Apical scarring bilaterally. Apical pleural calcifications. No interval change. Review of the MIP images confirms the above findings CTA HEAD FINDINGS Anterior circulation: Right cavernous carotid widely patent. Atherosclerotic calcification left cavernous carotid with mild stenosis. Anterior and middle cerebral arteries widely patent without stenosis or large vessel occlusion. Posterior circulation: Both vertebral arteries patent to the basilar. PICA patent bilaterally. Basilar widely patent. Superior cerebellar and posterior cerebral arteries patent bilaterally without stenosis or large vessel occlusion. Fetal origin right posterior cerebral artery. Venous sinuses: Normal venous enhancement. Anatomic variants: None Review of the MIP images confirms the above findings IMPRESSION: 1. Right carotid  endarterectomy is widely patent. 2. Mild atherosclerotic calcification left carotid bifurcation without significant stenosis. Mild stenosis left cavernous carotid due to atherosclerotic calcification. 3. No intracranial large vessel occlusion or significant  stenosis. Electronically Signed   By: CFranchot GalloM.D.   On: 04/07/2020 16:32   CT Angio Neck W and/or Wo Contrast  Result Date: 04/07/2020 CLINICAL DATA:  Acute neuro deficit.  Left facial droop. EXAM: CT ANGIOGRAPHY HEAD AND NECK TECHNIQUE: Multidetector CT imaging of the head and neck was performed using the standard protocol during bolus administration of intravenous contrast. Multiplanar CT image reconstructions and MIPs were obtained to evaluate the vascular anatomy. Carotid stenosis measurements (when applicable) are obtained utilizing NASCET criteria, using the distal  internal carotid diameter as the denominator. CONTRAST:  730mOMNIPAQUE IOHEXOL 350 MG/ML SOLN COMPARISON:  CT head 04/07/2020.  MRI 12/10/2019.  CTA 12/14/2019 FINDINGS: CTA NECK FINDINGS Aortic arch: Atherosclerotic calcification in the aortic arch. Proximal great vessels widely patent without stenosis.  Right carotid system: Postop right carotid endarterectomy. No significant stenosis, pseudoaneurysm or thrombus. No significant stenosis. Left carotid system: Atherosclerotic calcification left carotid bifurcation without significant stenosis. Vertebral arteries: Both vertebral arteries widely patent to the basilar. Left vertebral artery is dominant. Skeleton: ACDF with solid fusion C4-5. No acute skeletal abnormality. Multilevel degenerative change. Other neck: No mass, adenopathy, or fluid collection in the neck. Upper chest: Apical scarring bilaterally. Apical pleural calcifications. No interval change. Review of the MIP images confirms the above findings CTA HEAD FINDINGS Anterior circulation: Right cavernous carotid widely patent. Atherosclerotic calcification left cavernous carotid with mild stenosis. Anterior and middle cerebral arteries widely patent without stenosis or large vessel occlusion. Posterior circulation: Both vertebral arteries patent to the basilar. PICA patent bilaterally. Basilar widely patent. Superior cerebellar and posterior cerebral arteries patent bilaterally without stenosis or large vessel occlusion. Fetal origin right posterior cerebral artery. Venous sinuses: Normal venous enhancement. Anatomic variants: None Review of the MIP images confirms the above findings IMPRESSION: 1. Right carotid  endarterectomy is widely patent. 2. Mild atherosclerotic calcification left carotid bifurcation without significant stenosis. Mild stenosis left cavernous carotid due to atherosclerotic calcification. 3. No intracranial large vessel occlusion or significant  stenosis. Electronically Signed   By:  Franchot Gallo M.D.   On: 04/07/2020 16:32   CT HEAD CODE STROKE WO CONTRAST  Result Date: 04/07/2020 CLINICAL DATA:  Code stroke. Acute onset of left-sided mouth and facial droop and numbness beginning at 3 p.m. EXAM: CT HEAD WITHOUT CONTRAST TECHNIQUE: Contiguous axial images were obtained from the base of the skull through the vertex without intravenous contrast. COMPARISON:  CT head without contrast 12/14/2019 FINDINGS: Brain: Remote right frontal infarct is stable with associated white matter disease. Diffuse white matter disease is stable. Basal ganglia are stable. Remote lacunar infarcts of the thalami are unchanged. Insular ribbon is normal bilaterally. Remote lacunar infarct of the left cerebellum is stable. Vascular: Atherosclerotic calcifications are present within the cavernous internal carotid arteries bilaterally. No hyperdense vessel is present. Skull: Calvarium is intact. No focal lytic or blastic lesions are present. No significant extracranial soft tissue lesion is present. Sinuses/Orbits: The paranasal sinuses and mastoid air cells are clear. Bilateral lens replacements are noted. Globes and orbits are otherwise unremarkable. ASPECTS [facility] Stroke Program Early CT Score) - Ganglionic level infarction (caudate, lentiform nuclei, internal capsule, insula, M1-M3 cortex): 7/7 - Supraganglionic infarction (M4-M6 cortex): 3/3 Total score (0-10 with 10 being normal): 10/10 IMPRESSION: 1. Stable remote infarcts of the right frontal lobe, bilateral thalami, and left cerebellum. 2. No acute intracranial abnormality. 3. Stable atrophy and white matter disease. 4. ASPECTS is 10/10 These results were called by telephone at the time of interpretation on 04/07/2020 at 4:16 pm to provider [facility] , who verbally acknowledged these results. Electronically Signed   By: San Morelle M.D.   On: 04/07/2020 16:16    My personal review of EKG: Rhythm NSR, Rate  68 /min, QTc 432   Assessment & Plan:     Active Problems:   Hyperlipidemia   GERD (gastroesophageal reflux disease)   Essential hypertension   Diabetes (HCC)   CVA (cerebral vascular accident) ([place])   Focal neurological deficit  Focal neurological deficit -Patient with recent stroke with similar presentation in January of this year, but her carotid artery stenosis has been corrected, and CTA head and neck showing patent endarterectomy site. -This can be related to TIA, versus acute ischemic event. - obtain MRI of the brain. -no Need to repeat  echo -Continue with her home medication including aspirin and Plavix, she already received this morning, will continue from tomorrow. -Continue with statin. -We will consult PT/OT/SLP.   History of CVA -Please see above discussion  Right carotid stenosis - s/p CEA 10/21/2019.  Continue aspirin statin and Plavix.  Essential hypertension -hold medication until CVA is ruled out.  anxiety  -continue Xanax  Type 2 diabetes -continue amaryl  will add insulin sliding scale  GERD  - continue PPI    DVT Prophylaxis  SCDs   AM Labs Ordered, also please review Full Orders  Family Communication: Admission, patients condition and plan of care including tests being ordered have been discussed with the patient and dsughter at [facility] who indicate understanding and agree with the plan and Code Status.  Code Status Full  Likely DC to  Home  Condition GUARDED    Consults called:  Tele neurology  Admission status:  observation  Time spent in minutes : 60 minutes   Phillips Climes M.D on 04/07/2020 at [place] PM   [facility] - Office  [phone]

## 2020-04-07 NOTE — Consult Note (Signed)
[facility]  [facility] Tele[facility]urology Consult Services   Date of Service:   04/07/2020 16:22:41  Impression:     .  R29.810 - Facial numbness/ Facial weakness  Comments/Sign-Out: this is a 86 year old woman with very subtle left facial weakness. This could be representative of small subcortical stroke. At this point I would continue treating her with aspirin and Plavix. There is no evidence of large vessel occlusion. I would not treat her with TPA given the paucity of symptoms and her age.  Metrics: Last Known Well: 04/07/2020 15:00:00 [facility] Notification Time: 04/07/2020 16:22:41 Arrival Time: 04/07/2020 15:48:00 Stamp Time: 04/07/2020 16:22:41 Time First Login Attempt: 04/07/2020 16:26:39 Symptoms: left facial droop NIHSS Start Assessment Time: 04/07/2020 16:28:13 Patient is not a candidate for Thrombolytic. Thrombolytic Medical Decision: 04/07/2020 16:32:49 Patient was not deemed candidate for Thrombolytic because of following reasons: No disabling symptoms.  CT head showed no acute hemorrhage or acute core infarct.  ED Physician notified of diagnostic impression and management plan on 04/07/2020 16:38:41  Advanced Imaging:  CTA Head and Neck Completed.  LVO:No  Our recommendations are outlined below.  Recommendations:     .  Activate Stroke Protocol Admission/Order Set     .  Stroke/Telemetry Floor     .  Neuro Checks     .  Bedside Swallow Eval     .  DVT Prophylaxis     .  IV Fluids, Normal Saline     .  Head of Bed 30 Degrees     .  Euglycemia and Avoid Hyperthermia (PRN Acetaminophen)  Routine Consultation with [facility] for Follow up Care  Sign Out:     .  Discussed with Emergency Department Provider    ------------------------------------------------------------------------------  History of Present Illness: Patient is a 86 year old Female.  Patient was brought by private transportation with symptoms of left facial  droop  this is a 86 year old right-handed Caucasian woman with a history of strokes in the past. The patient had a carotid endarterectomy in February of this year. This is on the right side. At that time the patient did have left facial droop and slurred speech. The patient has been taking aspirin and Plavix. She tells me that today at approximately 1500 she developed mild left facial droop and some difficulty swallowing. She presented to the hospital.    Past Medical History:     . Hypertension     . Hyperlipidemia     . Stroke     . There is NO history of Diabetes Mellitus     . There is NO history of Atrial Fibrillation     . There is NO history of Coronary Artery Disease  Social History: Smoking: No Alcohol Use: No Drug Use: No  Review of System:  14 Points Review of Systems was performed and was negative except mentioned in HPI.  Anticoagulant use:  No  Antiplatelet use: aspirin and Plavix     Examination: BP(p), Pulse(p), Blood Glucose(p) 1A: Level of Consciousness - Alert; keenly responsive + 0 1B: Ask Month and Age - Both Questions Right + 0 1C: Blink Eyes & Squeeze Hands - Performs Both Tasks + 0 2: Test Horizontal Extraocular Movements - Normal + 0 3: Test Visual Fields - No Visual Loss + 0 4: Test Facial Palsy (Use Grimace if Obtunded) - Minor paralysis (flat nasolabial fold, smile asymmetry) + 1 5A: Test Left Arm Motor Drift - No Drift for 10 Seconds + 0 5B: Test Right Arm Motor Drift -  No Drift for 10 Seconds + 0 6A: Test Left Leg Motor Drift - No Drift for 5 Seconds + 0 6B: Test Right Leg Motor Drift - No Drift for 5 Seconds + 0 7: Test Limb Ataxia (FNF/Heel-Shin) - No Ataxia + 0 8: Test Sensation - Normal; No sensory loss + 0 9: Test Language/Aphasia - Normal; No aphasia + 0 10: Test Dysarthria - Normal + 0 11: Test Extinction/Inattention - No abnormality + 0  NIHSS Score: 1  Pre-Morbid Modified Rankin Scale: 0 Points = No symptoms at  all   Patient/Family was informed the Neurology Consult would occur via TeleHealth consult by way of interactive audio and video telecommunications and consented to receiving care in this manner.   Patient is being evaluated for possible acute neurologic impairment and high probability of imminent or life-threatening deterioration. I spent total of 20 minutes providing care to this patient, including time for face to face visit via telemedicine, review of medical records, imaging studies and discussion of findings with providers, the patient and/or family.   Dr Barnetta Chapel   [facility] [phone]  Case [id number]

## 2020-04-07 NOTE — ED Triage Notes (Signed)
Pt reports drinking some lemonade around 3pm and noticed she was drooling and left side of mouth and face felt  Numb.  Reports feels like left side of mouth was drawing.

## 2020-04-07 NOTE — ED Notes (Signed)
Neuro MD on telestoke assessing pt.  Pt states that he last known well was 3pm

## 2020-04-07 NOTE — ED Provider Notes (Signed)
[facility] EMERGENCY DEPARTMENT Provider Note   CSN: [id number] Arrival date & time: 04/07/20  1548  An emergency department physician performed an initial assessment on this suspected stroke patient at 1549.  History Chief Complaint  Patient presents with  . Code Stroke    Denise Parrish is a 86 y.o. female.  HPI   86 year old female, the patient has a history of aortic sclerosis.  History of carotid stenosis, 75% history on CT angiogram of the neck, she did have an endarterectomy on the right carotid after having a stroke in January.  She is known to have diabetes, she has hypertension and hyperlipidemia.  She presents to the hospital today with a complaint of left-sided facial droop and numbness which she thinks started about 1 hour ago.  She woke up this morning feeling like her normal self, she then noticed that she was having a change in the sensation on her face like her face was drooping, she was having trouble drinking out of a cup.  She had no difficulty using arms or legs.  The paramedics found the patient with a subtle facial droop, with decreased sensation on the left side of her face which is new since her prior stroke.  The patient does take her medications appropriately, states she has not missed any medications and in fact takes medications including metoprolol, glimepiride, Plavix, and a variety of other medications for her chronic conditions.  Her symptoms are persistent, mild to moderate, not improving or getting worse  Past Medical History:  Diagnosis Date  . Anxiety   . Aortic sclerosis    Long-standing heart murmur  . Carotid stenosis, right 10/16/2019   75% on CTA neck  . Chest pain 2006   +palpitations; minimal CAD at cath in 2005; normal EF; negative stress echo in 09/2008  . Cholelithiasis    on u/s 09/2011  . Degenerative joint disease    Right THA in 1995; cervical discectomy and fusion-2001  . Diabetes ([place])   . Diabetes mellitus, type 2 (HCC)    +  neuropathy  . Emphysema   . Gastroesophageal reflux disease   . Hiatal hernia   . Hyperlipidemia   . Hypertension   . IDA (iron deficiency anemia)    labs 09/2011  . Mild cognitive impairment   . Obesity   . Osteoporosis   . Pneumonia   . Sigmoid diverticulosis   . Small bowel lesion    On Given's capsule; Dr Kathleene Hazel 07/2012, no further w/u needed  . Stroke [facility])     Patient Active Problem List   Diagnosis Date Noted  . CVA (cerebral vascular accident) ([place]) 10/17/2019  . TIA (transient ischemic attack) 10/16/2019  . Slurred speech 10/16/2019  . Facial droop 10/16/2019  . Essential hypertension 10/16/2019  . Diabetes ([place]) 10/16/2019  . Stroke ([place]) 10/16/2019  . RUQ pain   . Dehydration   . Ileus ([place]) 12/12/2017  . Enteritis   . Right upper lobe pneumonia 12/08/2017  . Sepsis ([place]) 12/08/2017  . Lactic acidosis 12/08/2017  . Anxiety 12/08/2017  . Dehydration fever 12/08/2017  . Segmental ileitis of small intestine ([place]) 06/16/2015  . Diverticulosis of colon without hemorrhage 05/25/2015  . SBO (small bowel obstruction) ([place]) 05/23/2015  . Nausea and vomiting in adult   . Nausea vomiting and diarrhea 09/03/2013  . Vomiting 09/03/2013  . Transaminitis 09/03/2013  . Left leg weakness 10/14/2012  . Difficulty in walking(719.7) 10/14/2012  . Small bowel lesion 07/09/2012  .  Heme positive stool 03/14/2012  . GERD (gastroesophageal reflux disease) 03/14/2012  . Diarrhea 03/14/2012  . Right carotid bruit 11/01/2011  . Anemia, iron deficiency 10/28/2011  . Palpitations 10/16/2011  . Diabetes mellitus, type 2 ([place])   . Hypertension   . Hyperlipidemia   . Obesity   . Aortic sclerosis     Past Surgical History:  Procedure Laterality Date  . ANTERIOR CERVICAL DISCECTOMY  2001   C4-5, allograft, fixation  . BREAST LUMPECTOMY     benign  . CATARACT EXTRACTION W/ INTRAOCULAR LENS IMPLANT  2007   Left  . COLONOSCOPY  2006  . COLONOSCOPY  04/03/12   Dr.  Ok Edwards diverticulosis, negative microscopic  colitis   . ENDARTERECTOMY Right 10/21/2019   Procedure: ENDARTERECTOMY CAROTID RIGHT;  Surgeon: Waynetta Sandy, MD;  Location: [place];  Service: Vascular;  Laterality: Right;  . ESOPHAGOGASTRODUODENOSCOPY  04/03/12   Dr. Jennet Maduro hernia, chronic gastritis on bx  . GIVENS CAPSULE STUDY  05/09/2012   RMR: an unclear raised area of small bowel was noted, with features almost  characteristic of very small polyp. This was without villous  blunting or any evidence of active bleeding; yet, the area of  concern appeared to be erythematous. However, this could simply  be a light reflection on a normal variation of the small bowel. REFERRED TO DR. GILLIAM, APPT FOR NOVEMBER 2013.   [name] HIP ARTHROPLASTY Right   . PATCH ANGIOPLASTY Right 10/21/2019   Procedure: PATCH ANGIOPLASTY USING Rueben Bash BIOLOGIC PATCH;  Surgeon: Waynetta Sandy, MD;  Location: [place];  Service: Vascular;  Laterality: Right;  . TONSILLECTOMY    . TOTAL HIP ARTHROPLASTY  1995   Right     OB History   No obstetric history on file.     Family History  Problem Relation Age of Onset  . Cirrhosis Mother        no etoh use  . CAD Father   . Diabetes Mother        + brother  . Heart failure Father   . Hypertension Father        + brother  . Cancer Brother        lung, age 87  . Uterine cancer Sister   . Colon cancer Neg Hx     Social History   Tobacco Use  . Smoking status: Never Smoker  . Smokeless tobacco: Never Used  . Tobacco comment: Quit x 50 years  Vaping Use  . Vaping Use: Never used  Substance Use Topics  . Alcohol use: Never  . Drug use: Never    Home Medications Prior to Admission medications   Medication Sig Start Date End Date Taking? Authorizing Provider  acetaminophen (TYLENOL) 325 MG tablet Take 325 mg by mouth every 6 (six) hours as needed for moderate pain or headache.    [provider]  ALPRAZolam Duanne Moron) 0.5 MG  tablet Take 1 tablet (0.5 mg total) by mouth 3 (three) times daily. Patient taking differently: Take 0.5 mg by mouth at bedtime as needed. Occasionally 1 tablet during the day if needed. 12/11/17   Barton Dubois, MD  Ascorbic Acid (VITAMIN C WITH ROSE HIPS) 500 MG tablet Take 500 mg by mouth daily.    [provider]  aspirin 325 MG tablet Take 1 tablet (325 mg total) by mouth daily. 10/22/19   Georgette Shell, MD  atorvastatin (LIPITOR) 40 MG tablet Take 1 tablet (40 mg total) by mouth daily at 6 PM.  10/22/19   Georgette Shell, MD  clopidogrel (PLAVIX) 75 MG tablet Take 1 tablet (75 mg total) by mouth daily. 10/23/19   Georgette Shell, MD  glimepiride (AMARYL) 1 MG tablet Take 1 mg by mouth daily with breakfast.    [provider]  Glycerin-Hypromellose-PEG 400 (VISINE DRY EYE OP) Apply 1 drop to eye 3 (three) times daily as needed (for dry eye relief).     [provider]  meclizine (ANTIVERT) 12.5 MG tablet Take 1 tablet (12.5 mg total) by mouth 3 (three) times daily as needed for dizziness. 12/14/19   Virgel Manifold, MD  metoprolol tartrate (LOPRESSOR) 25 MG tablet Take 1 tablet by mouth 2 (two) times daily. 07/27/16   [provider]  multivitamin-lutein (OCUVITE-LUTEIN) CAPS capsule Take 1 capsule by mouth daily.    [provider]  omeprazole (PRILOSEC) 20 MG capsule Take 20 mg by mouth Daily. 08/05/12   [provider]  pantoprazole (PROTONIX) 40 MG tablet Take 1 tablet (40 mg total) by mouth daily. 10/23/19   Georgette Shell, MD  VITAMIN E PO Take 1 tablet by mouth daily.    [provider]    Allergies    Patient has no known allergies.  Review of Systems   Review of Systems  All other systems reviewed and are negative.   Physical Exam Updated Vital Signs BP (!) 156/68   Pulse 78   Temp 98.7 F (37.1 C) (Oral)   Resp 18   Ht 1.651 m (5' 5" )   Wt 75.3 kg   SpO2 97%   BMI 27.62 kg/m   Physical  Exam Vitals and nursing note reviewed.  Constitutional:      General: She is not in acute distress.    Appearance: She is well-developed.  HENT:     Head: Normocephalic and atraumatic.     Mouth/Throat:     Pharynx: No oropharyngeal exudate.  Eyes:     General: No scleral icterus.       Right eye: No discharge.        Left eye: No discharge.     Conjunctiva/sclera: Conjunctivae normal.     Pupils: Pupils are equal, round, and reactive to light.  Neck:     Thyroid: No thyromegaly.     Vascular: No JVD.  Cardiovascular:     Rate and Rhythm: Normal rate and regular rhythm.     Heart sounds: Normal heart sounds. No murmur heard.  No friction rub. No gallop.   Pulmonary:     Effort: Pulmonary effort is normal. No respiratory distress.     Breath sounds: Normal breath sounds. No wheezing or rales.  Abdominal:     General: Bowel sounds are normal. There is no distension.     Palpations: Abdomen is soft. There is no mass.     Tenderness: There is no abdominal tenderness.  Musculoskeletal:        General: No tenderness. Normal range of motion.     Cervical back: Normal range of motion and neck supple.  Lymphadenopathy:     Cervical: No cervical adenopathy.  Skin:    General: Skin is warm and dry.     Findings: No erythema or rash.  Neurological:     Mental Status: She is alert.     Coordination: Coordination normal.     Comments: Speech is clear, there is no obvious asymmetry to the face except for the left lower face and lip which may be drooping  a little bit.  She has decreased sensation to the left side of the face.  She has normal strength and sensation in all 4 extremities with normal finger-nose-finger, normal coordination, normal memory, she is able to answer questions appropriately without difficulty.  Psychiatric:        Behavior: Behavior normal.     ED Results / Procedures / Treatments   Labs (all labs ordered are listed, but only abnormal results are  displayed) Labs Reviewed  CBC - Abnormal; Notable for the following components:      Result Value   Hemoglobin 11.1 (*)    HCT 34.8 (*)    RDW 17.1 (*)    All other components within normal limits  COMPREHENSIVE METABOLIC PANEL - Abnormal; Notable for the following components:   Glucose, Bld 106 (*)    Creatinine, Ser 1.06 (*)    GFR calc non Af Amer 45 (*)    GFR calc Af Amer 52 (*)    All other components within normal limits  I-STAT CHEM 8, ED - Abnormal; Notable for the following components:   Creatinine, Ser 1.20 (*)    Glucose, Bld 106 (*)    Hemoglobin 11.9 (*)    HCT 35.0 (*)    All other components within normal limits  ETHANOL  PROTIME-INR  APTT  DIFFERENTIAL  RAPID URINE DRUG SCREEN, HOSP PERFORMED  URINALYSIS, ROUTINE W REFLEX MICROSCOPIC  CBG MONITORING, ED    EKG EKG Interpretation  Date/Time:  Wednesday April 07 2020 16:24:40 EDT Ventricular Rate:  68 PR Interval:    QRS Duration: 92 QT Interval:  406 QTC Calculation: 432 R Axis:   48 Text Interpretation: Sinus rhythm RSR' in V1 or V2, probably normal variant Nonspecific repol abnormality, diffuse leads Baseline wander in lead(s) II V2 since last tracing no significant change Confirmed by Noemi Chapel [phone]) on 04/07/2020 4:47:41 PM   Radiology CT Angio Head W or Wo Contrast  Result Date: 04/07/2020 CLINICAL DATA:  Acute neuro deficit.  Left facial droop. EXAM: CT ANGIOGRAPHY HEAD AND NECK TECHNIQUE: Multidetector CT imaging of the head and neck was performed using the standard protocol during bolus administration of intravenous contrast. Multiplanar CT image reconstructions and MIPs were obtained to evaluate the vascular anatomy. Carotid stenosis measurements (when applicable) are obtained utilizing NASCET criteria, using the distal internal carotid diameter as the denominator. CONTRAST:  64m OMNIPAQUE IOHEXOL 350 MG/ML SOLN COMPARISON:  CT head 04/07/2020.  MRI 12/10/2019.  CTA 12/14/2019 FINDINGS: CTA  NECK FINDINGS Aortic arch: Atherosclerotic calcification in the aortic arch. Proximal great vessels widely patent without stenosis. Right carotid system: Postop right carotid endarterectomy. No significant stenosis, pseudoaneurysm or thrombus. No significant stenosis. Left carotid system: Atherosclerotic calcification left carotid bifurcation without significant stenosis. Vertebral arteries: Both vertebral arteries widely patent to the basilar. Left vertebral artery is dominant. Skeleton: ACDF with solid fusion C4-5. No acute skeletal abnormality. Multilevel degenerative change. Other neck: No mass, adenopathy, or fluid collection in the neck. Upper chest: Apical scarring bilaterally. Apical pleural calcifications. No interval change. Review of the MIP images confirms the above findings CTA HEAD FINDINGS Anterior circulation: Right cavernous carotid widely patent. Atherosclerotic calcification left cavernous carotid with mild stenosis. Anterior and middle cerebral arteries widely patent without stenosis or large vessel occlusion. Posterior circulation: Both vertebral arteries patent to the basilar. PICA patent bilaterally. Basilar widely patent. Superior cerebellar and posterior cerebral arteries patent bilaterally without stenosis or large vessel occlusion. Fetal origin right posterior cerebral artery. Venous sinuses: Normal  venous enhancement. Anatomic variants: None Review of the MIP images confirms the above findings IMPRESSION: 1. Right carotid  endarterectomy is widely patent. 2. Mild atherosclerotic calcification left carotid bifurcation without significant stenosis. Mild stenosis left cavernous carotid due to atherosclerotic calcification. 3. No intracranial large vessel occlusion or significant  stenosis. Electronically Signed   By: Franchot Gallo M.D.   On: 04/07/2020 16:32   CT Angio Neck W and/or Wo Contrast  Result Date: 04/07/2020 CLINICAL DATA:  Acute neuro deficit.  Left facial droop. EXAM: CT  ANGIOGRAPHY HEAD AND NECK TECHNIQUE: Multidetector CT imaging of the head and neck was performed using the standard protocol during bolus administration of intravenous contrast. Multiplanar CT image reconstructions and MIPs were obtained to evaluate the vascular anatomy. Carotid stenosis measurements (when applicable) are obtained utilizing NASCET criteria, using the distal internal carotid diameter as the denominator. CONTRAST:  95m OMNIPAQUE IOHEXOL 350 MG/ML SOLN COMPARISON:  CT head 04/07/2020.  MRI 12/10/2019.  CTA 12/14/2019 FINDINGS: CTA NECK FINDINGS Aortic arch: Atherosclerotic calcification in the aortic arch. Proximal great vessels widely patent without stenosis. Right carotid system: Postop right carotid endarterectomy. No significant stenosis, pseudoaneurysm or thrombus. No significant stenosis. Left carotid system: Atherosclerotic calcification left carotid bifurcation without significant stenosis. Vertebral arteries: Both vertebral arteries widely patent to the basilar. Left vertebral artery is dominant. Skeleton: ACDF with solid fusion C4-5. No acute skeletal abnormality. Multilevel degenerative change. Other neck: No mass, adenopathy, or fluid collection in the neck. Upper chest: Apical scarring bilaterally. Apical pleural calcifications. No interval change. Review of the MIP images confirms the above findings CTA HEAD FINDINGS Anterior circulation: Right cavernous carotid widely patent. Atherosclerotic calcification left cavernous carotid with mild stenosis. Anterior and middle cerebral arteries widely patent without stenosis or large vessel occlusion. Posterior circulation: Both vertebral arteries patent to the basilar. PICA patent bilaterally. Basilar widely patent. Superior cerebellar and posterior cerebral arteries patent bilaterally without stenosis or large vessel occlusion. Fetal origin right posterior cerebral artery. Venous sinuses: Normal venous enhancement. Anatomic variants: None  Review of the MIP images confirms the above findings IMPRESSION: 1. Right carotid  endarterectomy is widely patent. 2. Mild atherosclerotic calcification left carotid bifurcation without significant stenosis. Mild stenosis left cavernous carotid due to atherosclerotic calcification. 3. No intracranial large vessel occlusion or significant  stenosis. Electronically Signed   By: CFranchot GalloM.D.   On: 04/07/2020 16:32   CT HEAD CODE STROKE WO CONTRAST  Result Date: 04/07/2020 CLINICAL DATA:  Code stroke. Acute onset of left-sided mouth and facial droop and numbness beginning at 3 p.m. EXAM: CT HEAD WITHOUT CONTRAST TECHNIQUE: Contiguous axial images were obtained from the base of the skull through the vertex without intravenous contrast. COMPARISON:  CT head without contrast 12/14/2019 FINDINGS: Brain: Remote right frontal infarct is stable with associated white matter disease. Diffuse white matter disease is stable. Basal ganglia are stable. Remote lacunar infarcts of the thalami are unchanged. Insular ribbon is normal bilaterally. Remote lacunar infarct of the left cerebellum is stable. Vascular: Atherosclerotic calcifications are present within the cavernous internal carotid arteries bilaterally. No hyperdense vessel is present. Skull: Calvarium is intact. No focal lytic or blastic lesions are present. No significant extracranial soft tissue lesion is present. Sinuses/Orbits: The paranasal sinuses and mastoid air cells are clear. Bilateral lens replacements are noted. Globes and orbits are otherwise unremarkable. ASPECTS [facility]) - Ganglionic level infarction (caudate, lentiform nuclei, internal capsule, insula, M1-M3 cortex): 7/7 - Supraganglionic infarction (M4-M6 cortex): 3/3 Total  score (0-10 with 10 being normal): 10/10 IMPRESSION: 1. Stable remote infarcts of the right frontal lobe, bilateral thalami, and left cerebellum. 2. No acute intracranial abnormality. 3. Stable  atrophy and white matter disease. 4. ASPECTS is 10/10 These results were called by telephone at the time of interpretation on 04/07/2020 at 4:16 pm to provider [facility] , who verbally acknowledged these results. Electronically Signed   By: San Morelle M.D.   On: 04/07/2020 16:16    Procedures Procedures (including critical care time)  Medications Ordered in ED Medications  iohexol (OMNIPAQUE) 350 MG/ML injection 100 mL (75 mLs Intravenous Contrast Given 04/07/20 1612)    ED Course  I have reviewed the triage vital signs and the nursing notes.  Pertinent labs & imaging results that were available during my care of the patient were reviewed by me and considered in my medical decision making (see chart for details).  Clinical Course as of Apr 07 1704  Wed Apr 07, 2020  1641 This patient has been seen by teleneurology, they called me with recommendations at about 4:30 PM.  They recommend that this patient have an inpatient work-up for stroke including an MRI, they would like her to be in the hospital overnight as the symptoms could get worse overnight.  I have paged the hospitalist for admission at 4:40 PM  Thankfully there is no signs of acute hemorrhage, carotid arteries appear patent, no large vessel occlusion that is seen according to neurology   [BM]  1642 Labs reviewed, metabolic panel unremarkable except for a creatinine of 1.2, CBC with minimal scant anemia   [BM]  1642 .  Vital signs remained with mild hypertension, would not aggressively manage that at this time.   [BM]    Clinical Course User Index [BM] Noemi Chapel, MD   MDM Rules/Calculators/A&P                          This patient has no headache, she is on Plavix, she has symptoms that could be consistent with an acute stroke with a history of a carotid endarterectomy.  She will need a CT scan as well as a CT angiogram to further evaluate the neck vessels.  Discussed with the neurologist as I was on the call  with another patient and she recommended that the patient would indeed benefit from a code stroke activation though given the mild nature of her symptoms recommended against TPA.  Discussed the case with the hospitalist at 5:05 PM, Dr. Waldron Labs will admit  Final Clinical Impression(s) / ED Diagnoses Final diagnoses:  Acute ischemic stroke [facility])    Rx / DC Orders ED Discharge Orders    None       Noemi Chapel, MD 04/07/20 1705

## 2020-04-07 NOTE — ED Notes (Signed)
Pt back from CT neuro on tele stroke

## 2020-04-07 NOTE — Progress Notes (Signed)
Report received from ED nurse.

## 2020-04-08 DIAGNOSIS — E785 Hyperlipidemia, unspecified: Secondary | ICD-10-CM

## 2020-04-08 DIAGNOSIS — I10 Essential (primary) hypertension: Secondary | ICD-10-CM | POA: Diagnosis not present

## 2020-04-08 DIAGNOSIS — R29818 Other symptoms and signs involving the nervous system: Secondary | ICD-10-CM | POA: Diagnosis not present

## 2020-04-08 DIAGNOSIS — I639 Cerebral infarction, unspecified: Secondary | ICD-10-CM | POA: Diagnosis not present

## 2020-04-08 DIAGNOSIS — E1169 Type 2 diabetes mellitus with other specified complication: Secondary | ICD-10-CM | POA: Diagnosis not present

## 2020-04-08 LAB — URINALYSIS, ROUTINE W REFLEX MICROSCOPIC
Bilirubin Urine: NEGATIVE
Glucose, UA: 150 mg/dL — AB
Hgb urine dipstick: NEGATIVE
Ketones, ur: NEGATIVE mg/dL
Leukocytes,Ua: NEGATIVE
Nitrite: NEGATIVE
Protein, ur: NEGATIVE mg/dL
Specific Gravity, Urine: 1.011 (ref 1.005–1.030)
pH: 5 (ref 5.0–8.0)

## 2020-04-08 LAB — LIPID PANEL
Cholesterol: 100 mg/dL (ref 0–200)
HDL: 35 mg/dL — ABNORMAL LOW (ref >40)
LDL Cholesterol: 48 mg/dL (ref 0–99)
Total CHOL/HDL Ratio: 2.9 RATIO
Triglycerides: 83 mg/dL (ref <150)
VLDL: 17 mg/dL (ref 0–40)

## 2020-04-08 LAB — GLUCOSE, CAPILLARY
Glucose-Capillary: 116 mg/dL — ABNORMAL HIGH (ref 70–99)
Glucose-Capillary: 200 mg/dL — ABNORMAL HIGH (ref 70–99)
Glucose-Capillary: 75 mg/dL (ref 70–99)

## 2020-04-08 LAB — RAPID URINE DRUG SCREEN, HOSP PERFORMED
Amphetamines: NOT DETECTED
Barbiturates: NOT DETECTED
Benzodiazepines: NOT DETECTED
Cocaine: NOT DETECTED
Opiates: NOT DETECTED
Tetrahydrocannabinol: NOT DETECTED

## 2020-04-08 LAB — HEMOGLOBIN A1C
Hgb A1c MFr Bld: 7 % — ABNORMAL HIGH (ref 4.8–5.6)
Mean Plasma Glucose: 154.2 mg/dL

## 2020-04-08 NOTE — Discharge Summary (Signed)
Physician Discharge Summary  Denise Parrish DPO:[id number] DOB: April 15, 1923 DOA: 04/07/2020  PCP: [name], MD  Admit date: 04/07/2020 Discharge date: 04/08/2020  Admitted From: Home Disposition: Home  Recommendations for Outpatient Follow-up:  1. Follow up with PCP in 1-2 weeks 2. Please obtain BMP/CBC in one week 3. Follow-up with neurology, Dr. Merlene Laughter in 4 weeks  Discharge Condition: Stable CODE STATUS: Full code Diet recommendation: Heart healthy, carb modified  Brief/Interim Summary:  Denise Parrish  is a 86 y.o. 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, with no residual deficits, secondary to carotid artery stenosis, status post CEA 10/21/2019, remains on aspirin, statin and Plavix, diabetes mellitus, GERD, hypertension, patient presents to ED secondary to complaints of facial numbness and facial weakness, Patient did have left facial droop, and slurred speech this morning, it did happen today around at 3 PM, as well she did report some difficulty swallowing, which prompted her to come to ED, patient denies any extremities tingling, or focal weakness, she denies any headache, fever, chills, any dysuria or polyuria ports she has been compliant with her aspirin, Plavix and  statin which she took this morning. -In ED patient was seen by teleneurology, with CTA head and neck was obtained, which did show Right carotid endarterectomy is widely patent, and no intracranial large vessel occlusions or significant stenosis.  Teleneurology recommended admission for observation and MRI brain, [facility] consulted to admit  Discharge Diagnoses:  Active Problems:   Hyperlipidemia   GERD (gastroesophageal reflux disease)   Essential hypertension   Diabetes ([place])   CVA (cerebral vascular accident) ([place])   Focal neurological deficit  1. Acute CVA.  Patient presented with focal neurological deficit including numbness around the left side of her mouth.   Symptoms have persisted.  MRI of the brain did not show any acute infarct and CTA of the head did not show any significant large vessel occlusion.  Discussed with neurology, Dr. Merlene Laughter and it was felt that this may be related to a small infarct, not picked up by MRI.  Patient is already on maximum treatment with dual antiplatelet therapy, statin.  No additional therapy is recommended.  CT of her neck did not show any significant stenosis bilaterally.  Echocardiogram performed earlier this year and was not repeated.  She is already on a statin.  Seen by physical therapy, Occupational Therapy and speech therapy with no further therapy follow-up.  Patient was felt to be at her baseline appropriate to discharge home. 2. Right carotid artery stenosis.  Status post carotid endarterectomy in 10/2019.  Continue aspirin, statin and Plavix 3. Hypertension.  Resume home medications on discharge 4. Type 2 diabetes.  Continue on Amaryl 5. Anxiety.  Continue on home dose of Xanax  Discharge Instructions  Discharge Instructions    Diet - low sodium heart healthy   Complete by: As directed    Increase activity slowly   Complete by: As directed      Allergies as of 04/08/2020   No Known Allergies     Medication List    STOP taking these medications   ibuprofen 200 MG tablet Commonly known as: ADVIL     TAKE these medications   ALPRAZolam 0.5 MG tablet Commonly known as: XANAX Take 1 tablet (0.5 mg total) by mouth 3 (three) times daily. What changed:   when to take this  additional instructions   aspirin 325 MG tablet Take 1 tablet (325 mg total) by mouth daily.  atorvastatin 40 MG tablet Commonly known as: LIPITOR Take 1 tablet (40 mg total) by mouth daily at 6 PM.   clopidogrel 75 MG tablet Commonly known as: PLAVIX Take 1 tablet (75 mg total) by mouth daily.   glimepiride 1 MG tablet Commonly known as: AMARYL Take 1 mg by mouth daily with breakfast.   meclizine 12.5 MG  tablet Commonly known as: ANTIVERT Take 1 tablet (12.5 mg total) by mouth 3 (three) times daily as needed for dizziness.   metoprolol tartrate 25 MG tablet Commonly known as: LOPRESSOR Take 25 mg by mouth 2 (two) times daily.   multivitamin-lutein Caps capsule Take 1 capsule by mouth daily.   omeprazole 20 MG capsule Commonly known as: PRILOSEC Take 20 mg by mouth Daily.   VISINE DRY EYE OP Apply 1 drop to eye 3 (three) times daily as needed (for dry eye relief).   vitamin C with rose hips 500 MG tablet Take 500 mg by mouth daily.   VITAMIN E PO Take 1 tablet by mouth daily.       Follow-up Information    Phillips Odor, MD. Schedule an appointment as soon as possible for a visit in 4 day(s).   Specialty: Neurology Contact information: [address] [place] [address] [phone]              No Known Allergies  Consultations:     Procedures/Studies: CT Angio Head W or Wo Contrast  Result Date: 04/07/2020 CLINICAL DATA:  Acute neuro deficit.  Left facial droop. EXAM: CT ANGIOGRAPHY HEAD AND NECK TECHNIQUE: Multidetector CT imaging of the head and neck was performed using the standard protocol during bolus administration of intravenous contrast. Multiplanar CT image reconstructions and MIPs were obtained to evaluate the vascular anatomy. Carotid stenosis measurements (when applicable) are obtained utilizing NASCET criteria, using the distal internal carotid diameter as the denominator. CONTRAST:  43m OMNIPAQUE IOHEXOL 350 MG/ML SOLN COMPARISON:  CT head 04/07/2020.  MRI 12/10/2019.  CTA 12/14/2019 FINDINGS: CTA NECK FINDINGS Aortic arch: Atherosclerotic calcification in the aortic arch. Proximal great vessels widely patent without stenosis. Right carotid system: Postop right carotid endarterectomy. No significant stenosis, pseudoaneurysm or thrombus. No significant stenosis. Left carotid system: Atherosclerotic calcification left carotid bifurcation without  significant stenosis. Vertebral arteries: Both vertebral arteries widely patent to the basilar. Left vertebral artery is dominant. Skeleton: ACDF with solid fusion C4-5. No acute skeletal abnormality. Multilevel degenerative change. Other neck: No mass, adenopathy, or fluid collection in the neck. Upper chest: Apical scarring bilaterally. Apical pleural calcifications. No interval change. Review of the MIP images confirms the above findings CTA HEAD FINDINGS Anterior circulation: Right cavernous carotid widely patent. Atherosclerotic calcification left cavernous carotid with mild stenosis. Anterior and middle cerebral arteries widely patent without stenosis or large vessel occlusion. Posterior circulation: Both vertebral arteries patent to the basilar. PICA patent bilaterally. Basilar widely patent. Superior cerebellar and posterior cerebral arteries patent bilaterally without stenosis or large vessel occlusion. Fetal origin right posterior cerebral artery. Venous sinuses: Normal venous enhancement. Anatomic variants: None Review of the MIP images confirms the above findings IMPRESSION: 1. Right carotid  endarterectomy is widely patent. 2. Mild atherosclerotic calcification left carotid bifurcation without significant stenosis. Mild stenosis left cavernous carotid due to atherosclerotic calcification. 3. No intracranial large vessel occlusion or significant  stenosis. Electronically Signed   By: CFranchot GalloM.D.   On: 04/07/2020 16:32   CT Angio Neck W and/or Wo Contrast  Result Date: 04/07/2020 CLINICAL DATA:  Acute  neuro deficit.  Left facial droop. EXAM: CT ANGIOGRAPHY HEAD AND NECK TECHNIQUE: Multidetector CT imaging of the head and neck was performed using the standard protocol during bolus administration of intravenous contrast. Multiplanar CT image reconstructions and MIPs were obtained to evaluate the vascular anatomy. Carotid stenosis measurements (when applicable) are obtained utilizing NASCET  criteria, using the distal internal carotid diameter as the denominator. CONTRAST:  60m OMNIPAQUE IOHEXOL 350 MG/ML SOLN COMPARISON:  CT head 04/07/2020.  MRI 12/10/2019.  CTA 12/14/2019 FINDINGS: CTA NECK FINDINGS Aortic arch: Atherosclerotic calcification in the aortic arch. Proximal great vessels widely patent without stenosis. Right carotid system: Postop right carotid endarterectomy. No significant stenosis, pseudoaneurysm or thrombus. No significant stenosis. Left carotid system: Atherosclerotic calcification left carotid bifurcation without significant stenosis. Vertebral arteries: Both vertebral arteries widely patent to the basilar. Left vertebral artery is dominant. Skeleton: ACDF with solid fusion C4-5. No acute skeletal abnormality. Multilevel degenerative change. Other neck: No mass, adenopathy, or fluid collection in the neck. Upper chest: Apical scarring bilaterally. Apical pleural calcifications. No interval change. Review of the MIP images confirms the above findings CTA HEAD FINDINGS Anterior circulation: Right cavernous carotid widely patent. Atherosclerotic calcification left cavernous carotid with mild stenosis. Anterior and middle cerebral arteries widely patent without stenosis or large vessel occlusion. Posterior circulation: Both vertebral arteries patent to the basilar. PICA patent bilaterally. Basilar widely patent. Superior cerebellar and posterior cerebral arteries patent bilaterally without stenosis or large vessel occlusion. Fetal origin right posterior cerebral artery. Venous sinuses: Normal venous enhancement. Anatomic variants: None Review of the MIP images confirms the above findings IMPRESSION: 1. Right carotid  endarterectomy is widely patent. 2. Mild atherosclerotic calcification left carotid bifurcation without significant stenosis. Mild stenosis left cavernous carotid due to atherosclerotic calcification. 3. No intracranial large vessel occlusion or significant  stenosis.  Electronically Signed   By: CFranchot GalloM.D.   On: 04/07/2020 16:32   MR BRAIN WO CONTRAST  Result Date: 04/07/2020 CLINICAL DATA:  Transient ischemic attack. Left facial weakness and numbness beginning 1500 hours today. History of right carotid endarterectomy. EXAM: MRI HEAD WITHOUT CONTRAST TECHNIQUE: Multiplanar, multiecho pulse sequences of the brain and surrounding structures were obtained without intravenous contrast. COMPARISON:  CT angiography same day.  MRI 12/14/2019 FINDINGS: Brain: Diffusion imaging does not show any acute or subacute infarction. Old small vessel infarction in the left cerebellum. Chronic small-vessel ischemic changes throughout the pons. Marked chronic small-vessel ischemic changes throughout the cerebral hemispheric white matter. Old right frontoparietal cortical and subcortical infarction. No mass lesion, hemorrhage, hydrocephalus or extra-axial collection. Vascular: Major vessels at the base of the brain show flow. Skull and upper cervical spine: Negative Sinuses/Orbits: Clear/normal Other: None IMPRESSION: No acute finding by MRI. Chronic small-vessel ischemic changes throughout the brain as outlined above. Old right frontoparietal cortical infarction. Electronically Signed   By: MNelson ChimesM.D.   On: 04/07/2020 18:46   CT HEAD CODE STROKE WO CONTRAST  Result Date: 04/07/2020 CLINICAL DATA:  Code stroke. Acute onset of left-sided mouth and facial droop and numbness beginning at 3 p.m. EXAM: CT HEAD WITHOUT CONTRAST TECHNIQUE: Contiguous axial images were obtained from the base of the skull through the vertex without intravenous contrast. COMPARISON:  CT head without contrast 12/14/2019 FINDINGS: Brain: Remote right frontal infarct is stable with associated white matter disease. Diffuse white matter disease is stable. Basal ganglia are stable. Remote lacunar infarcts of the thalami are unchanged. Insular ribbon is normal bilaterally. Remote lacunar infarct of the left  cerebellum  is stable. Vascular: Atherosclerotic calcifications are present within the cavernous internal carotid arteries bilaterally. No hyperdense vessel is present. Skull: Calvarium is intact. No focal lytic or blastic lesions are present. No significant extracranial soft tissue lesion is present. Sinuses/Orbits: The paranasal sinuses and mastoid air cells are clear. Bilateral lens replacements are noted. Globes and orbits are otherwise unremarkable. ASPECTS [facility] Iv Stroke Program Early CT Score) - Ganglionic level infarction (caudate, lentiform nuclei, internal capsule, insula, M1-M3 cortex): 7/7 - Supraganglionic infarction (M4-M6 cortex): 3/3 Total score (0-10 with 10 being normal): 10/10 IMPRESSION: 1. Stable remote infarcts of the right frontal lobe, bilateral thalami, and left cerebellum. 2. No acute intracranial abnormality. 3. Stable atrophy and white matter disease. 4. ASPECTS is 10/10 These results were called by telephone at the time of interpretation on 04/07/2020 at 4:16 pm to provider [facility]. [facility] , who verbally acknowledged these results. Electronically Signed   By: San Morelle M.D.   On: 04/07/2020 16:16       Subjective: Continues to have some numbness around the left side of her mouth, no other focal weakness or numbness  Discharge Exam: Vitals:   04/08/20 0755 04/08/20 0900 04/08/20 1301 04/08/20 1439  BP:  (!) 108/55 118/75 (!) 142/64  Pulse:  67  66  Resp:  18 18 18   Temp:  98.2 F (36.8 C) 98 F (36.7 C) 97.9 F (36.6 C)  TempSrc:  Oral Oral   SpO2: 94% 97% 98% 100%  Weight:      Height:        General: Pt is alert, awake, not in acute distress Cardiovascular: RRR, S1/S2 +, no rubs, no gallops Respiratory: CTA bilaterally, no wheezing, no rhonchi Abdominal: Soft, NT, ND, bowel sounds + Extremities: no edema, no cyanosis    The results of significant diagnostics from this hospitalization (including imaging, microbiology, ancillary and laboratory) are  listed below for reference.     Microbiology: Recent Results (from the past 240 hour(s))  SARS Coronavirus 2 by RT PCR (hospital order, performed in [facility] hospital lab) Nasopharyngeal Nasopharyngeal Swab     Status: None   Collection Time: 04/07/20  5:40 PM   Specimen: Nasopharyngeal Swab  Result Value Ref Range Status   SARS Coronavirus 2 NEGATIVE NEGATIVE Final    Comment: (NOTE) SARS-CoV-2 target nucleic acids are NOT DETECTED.  The SARS-CoV-2 RNA is generally detectable in upper and lower respiratory specimens during the acute phase of infection. The lowest concentration of SARS-CoV-2 viral copies this assay can detect is 250 copies / mL. A negative result does not preclude SARS-CoV-2 infection and should not be used as the sole basis for treatment or other patient management decisions.  A negative result may occur with improper specimen collection / handling, submission of specimen other than nasopharyngeal swab, presence of viral mutation(s) within the areas targeted by this assay, and inadequate number of viral copies (<250 copies / mL). A negative result must be combined with clinical observations, patient history, and epidemiological information.  Fact Sheet for Patients:   StrictlyIdeas.no  Fact Sheet for Healthcare Providers: BankingDealers.co.za  This test is not yet approved or  cleared by the [place] FDA and has been authorized for detection and/or diagnosis of SARS-CoV-2 by FDA under an Emergency Use Authorization (EUA).  This EUA will remain in effect (meaning this test can be used) for the duration of the COVID-19 declaration under Section 564(b)(1) of the Act, 21 U.S.C. section 360bbb-3(b)(1), unless the authorization is terminated or revoked sooner.  Performed at [facility], [address], [place],  [address]      Labs: BNP (last 3 results) No results for input(s): BNP in the last 8760  hours. Basic Metabolic Panel: Recent Labs  Lab 04/07/20 1615 04/07/20 1618  NA 137 140  K 4.1 4.0  CL 102 101  CO2 25  --   GLUCOSE 106* 106*  BUN 19 18  CREATININE 1.06* 1.20*  CALCIUM 9.4  --    Liver Function Tests: Recent Labs  Lab 04/07/20 1615  AST 26  ALT 22  ALKPHOS 79  BILITOT 1.2  PROT 6.9  ALBUMIN 3.7   No results for input(s): LIPASE, AMYLASE in the last 168 hours. No results for input(s): AMMONIA in the last 168 hours. CBC: Recent Labs  Lab 04/07/20 1615 04/07/20 1618  WBC 6.1  --   NEUTROABS 3.1  --   HGB 11.1* 11.9*  HCT 34.8* 35.0*  MCV 86.8  --   PLT 269  --    Cardiac Enzymes: No results for input(s): CKTOTAL, CKMB, CKMBINDEX, TROPONINI in the last 168 hours. BNP: Invalid input(s): POCBNP CBG: Recent Labs  Lab 04/07/20 1617 04/07/20 2054 04/08/20 0740 04/08/20 1108 04/08/20 1617  GLUCAP 87 153* 116* 200* 75   D-Dimer No results for input(s): DDIMER in the last 72 hours. Hgb A1c Recent Labs    04/07/20 1615  HGBA1C 7.0*   Lipid Profile Recent Labs    04/08/20 0441  CHOL 100  HDL 35*  LDLCALC 48  TRIG 83  CHOLHDL 2.9   Thyroid function studies No results for input(s): TSH, T4TOTAL, T3FREE, THYROIDAB in the last 72 hours.  Invalid input(s): FREET3 Anemia work up No results for input(s): VITAMINB12, FOLATE, FERRITIN, TIBC, IRON, RETICCTPCT in the last 72 hours. Urinalysis    Component Value Date/Time   COLORURINE STRAW (A) 04/08/2020 1100   APPEARANCEUR CLEAR 04/08/2020 1100   LABSPEC 1.011 04/08/2020 1100   PHURINE 5.0 04/08/2020 1100   GLUCOSEU 150 (A) 04/08/2020 1100   HGBUR NEGATIVE 04/08/2020 1100   BILIRUBINUR NEGATIVE 04/08/2020 1100   KETONESUR NEGATIVE 04/08/2020 1100   PROTEINUR NEGATIVE 04/08/2020 1100   UROBILINOGEN 1.0 05/23/2015 1956   NITRITE NEGATIVE 04/08/2020 1100   LEUKOCYTESUR NEGATIVE 04/08/2020 1100   Sepsis Labs Invalid input(s): PROCALCITONIN,  WBC,  LACTICIDVEN Microbiology Recent  Results (from the past 240 hour(s))  SARS Coronavirus 2 by RT PCR (hospital order, performed in [place] hospital lab) Nasopharyngeal Nasopharyngeal Swab     Status: None   Collection Time: 04/07/20  5:40 PM   Specimen: Nasopharyngeal Swab  Result Value Ref Range Status   SARS Coronavirus 2 NEGATIVE NEGATIVE Final    Comment: (NOTE) SARS-CoV-2 target nucleic acids are NOT DETECTED.  The SARS-CoV-2 RNA is generally detectable in upper and lower respiratory specimens during the acute phase of infection. The lowest concentration of SARS-CoV-2 viral copies this assay can detect is 250 copies / mL. A negative result does not preclude SARS-CoV-2 infection and should not be used as the sole basis for treatment or other patient management decisions.  A negative result may occur with improper specimen collection / handling, submission of specimen other than nasopharyngeal swab, presence of viral mutation(s) within the areas targeted by this assay, and inadequate number of viral copies (<250 copies / mL). A negative result must be combined with clinical observations, patient history, and epidemiological information.  Fact Sheet for Patients:   StrictlyIdeas.no  Fact Sheet for Healthcare Providers: BankingDealers.co.za  This  test is not yet approved or  cleared by the [place] and has been authorized for detection and/or diagnosis of SARS-CoV-2 by FDA under an Emergency Use Authorization (EUA).  This EUA will remain in effect (meaning this test can be used) for the duration of the COVID-19 declaration under Section 564(b)(1) of the Act, 21 U.S.C. section 360bbb-3(b)(1), unless the authorization is terminated or revoked sooner.  Performed at [facility], [address], [place], [place] [address]      Time coordinating discharge: 61mns  SIGNED:   JKathie Dike MD  [facility] 04/08/2020, 7:59 PM   If 7PM-7AM,  please contact night-coverage [email or web address]

## 2020-04-08 NOTE — Care Management Obs Status (Signed)
[place] NOTIFICATION   Patient Details  Name: Denise Parrish MRN: [id number] Date of Birth: 1923-01-15   Medicare Observation Status Notification Given:  Yes    Tommy Medal 04/08/2020, 4:14 PM

## 2020-04-08 NOTE — Evaluation (Signed)
Physical Therapy Evaluation Patient Details Name: Denise Parrish MRN: [id number] DOB: [birth year] Today's Date: 04/08/2020   History of Present Illness  Denise Parrish  is a 86 y.o. 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, with no residual deficits, secondary to carotid artery stenosis, status post CEA 10/21/2019, remains on aspirin, statin and Plavix, diabetes mellitus, GERD, hypertension, patient presents to ED secondary to complaints of facial numbness and facial weakness, Patient did have left facial droop, and slurred speech this morning, it did happen today around at 3 PM, as well she did report some difficulty swallowing, which prompted her to come to ED, patient denies any extremities tingling, or focal weakness, she denies any headache, fever, chills, any dysuria or polyuria ports she has been compliant with her aspirin, Plavix and  statin which she took this morning. MRI negative for acute CVA    Clinical Impression  Patient functioning at baseline for functional mobility and gait.  Patient demonstrates good return for ambulation in room, hallways and on stairs using 1 side rail without loss of balance.  Plan:  Patient discharged from physical therapy to care of nursing for ambulation daily as tolerated for length of stay.     Follow Up Recommendations No PT follow up    Equipment Recommendations  None recommended by PT    Recommendations for Other Services       Precautions / Restrictions Precautions Precautions: None Restrictions Weight Bearing Restrictions: No      Mobility  Bed Mobility Overal bed mobility: Modified Independent                Transfers Overall transfer level: Modified independent Equipment used: None Transfers: Sit to/from [employer] Transfers Sit to Stand: Modified independent (Device/Increase time) Stand pivot transfers: Modified independent (Device/Increase time)       General transfer comment: slightly  increased time  Ambulation/Gait Ambulation/Gait assistance: Modified independent (Device/Increase time) Gait Distance (Feet): 100 Feet Assistive device: None Gait Pattern/deviations: WFL(Within Functional Limits) Gait velocity: decreased   General Gait Details: grossly WFL with slightly labored cadence without loss of balance  Stairs Stairs: Yes Stairs assistance: Modified independent (Device/Increase time) Stair Management: One rail Left;Alternating pattern Number of Stairs: 5 General stair comments: demonstrates good return for going up/down steps in stair well without loss of balance  Wheelchair Mobility    Modified Rankin (Stroke Patients Only)       Balance Overall balance assessment: No apparent balance deficits (not formally assessed)                                           Pertinent Vitals/Pain Pain Assessment: No/denies pain    Home Living Family/patient expects to be discharged to:: Private residence Living Arrangements: Alone Available Help at Discharge: Family;Available 24 hours/day Type of Home: House Home Access: Stairs to enter Entrance Stairs-Rails: Right;Left;Can reach both Entrance Stairs-Number of Steps: 4-5 Home Layout: One level Home Equipment: Other (comment);Walker - 2 wheels;Cane - quad;Wheelchair - manual;Bedside commode Additional Comments: Pt sponge bathes at baseline    Prior Function Level of Independence: Independent with assistive device(s)         Comments: household ambulator without AD, uses walking stick for outside and short community distances, independent in ADLs     Hand Dominance   Dominant Hand: Right    Extremity/Trunk Assessment   Upper Extremity Assessment Upper  Extremity Assessment: Defer to OT evaluation    Lower Extremity Assessment Lower Extremity Assessment: Overall WFL for tasks assessed    Cervical / Trunk Assessment Cervical / Trunk Assessment: Normal  Communication    Communication: HOH  Cognition Arousal/Alertness: Awake/alert Behavior During Therapy: WFL for tasks assessed/performed Overall Cognitive Status: Within Functional Limits for tasks assessed                                        General Comments      Exercises     Assessment/Plan    PT Assessment Patent does not need any further PT services  PT Problem List         PT Treatment Interventions      PT Goals (Current goals can be found in the Care Plan section)  Acute Rehab PT Goals Patient Stated Goal: return home with family to assist PT Goal Formulation: With patient Time For Goal Achievement: 04/08/20 Potential to Achieve Goals: Good    Frequency     Barriers to discharge        Co-evaluation               AM-PAC PT "6 Clicks" Mobility  Outcome Measure Help needed turning from your back to your side while in a flat bed without using bedrails?: None Help needed moving from lying on your back to sitting on the side of a flat bed without using bedrails?: None Help needed moving to and from a bed to a chair (including a wheelchair)?: None Help needed standing up from a chair using your arms (e.g., wheelchair or bedside chair)?: None Help needed to walk in hospital room?: None Help needed climbing 3-5 steps with a railing? : None 6 Click Score: 24    End of Session   Activity Tolerance: Patient tolerated treatment well;Patient limited by fatigue Patient left: in chair Nurse Communication: Mobility status PT Visit Diagnosis: Unsteadiness on feet (R26.81);Other abnormalities of gait and mobility (R26.89);Muscle weakness (generalized) (M62.81)    Time: 5670-1410 PT Time Calculation (min) (ACUTE ONLY): 20 min   Charges:   PT Evaluation $PT Eval Moderate Complexity: 1 Mod PT Treatments $Therapeutic Activity: 8-22 mins        1:49 PM, 04/08/20 [name], MPT Physical Therapist with [facility] [phone]  office [phone] mobile phone

## 2020-04-08 NOTE — Evaluation (Signed)
Occupational Therapy Evaluation Patient Details Name: Denise Parrish MRN: [id number] DOB: 1923/01/07 Today's Date: 04/08/2020    History of Present Illness Denise Parrish  is a 86 y.o. 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, with no residual deficits, secondary to carotid artery stenosis, status post CEA 10/21/2019, remains on aspirin, statin and Plavix, diabetes mellitus, GERD, hypertension, patient presents to ED secondary to complaints of facial numbness and facial weakness, Patient did have left facial droop, and slurred speech this morning, it did happen today around at 3 PM, as well she did report some difficulty swallowing, which prompted her to come to ED, patient denies any extremities tingling, or focal weakness, she denies any headache, fever, chills, any dysuria or polyuria ports she has been compliant with her aspirin, Plavix and  statin which she took this morning. MRI negative for acute CVA   Clinical Impression   Pt agreeable to OT evaluation, reports initial symptoms are improving however continues to have sensation changes in left lip area. Pt demonstrating BUE strength WFL, coordination and sensation are intact. Pt performing ADLs at supervision/mod I level. No further OT services required at this time as pt is at her functional baseline.     Follow Up Recommendations  No OT follow up    Equipment Recommendations  None recommended by OT       Precautions / Restrictions Precautions Precautions: None Restrictions Weight Bearing Restrictions: No      Mobility Bed Mobility Overal bed mobility: Modified Independent                Transfers Overall transfer level: Needs assistance Equipment used: None Transfers: Sit to/from Stand;Stand Pivot Transfers Sit to Stand: Supervision Stand pivot transfers: Supervision                ADL either performed or assessed with clinical judgement   ADL Overall ADL's : Modified  independent;At baseline                                       General ADL Comments: Pt performing ADLs without difficulty     Vision Baseline Vision/History: Wears glasses Wears Glasses: At all times Patient Visual Report: No change from baseline Vision Assessment?: No apparent visual deficits            Pertinent Vitals/Pain Pain Assessment: No/denies pain     Hand Dominance Right   Extremity/Trunk Assessment Upper Extremity Assessment Upper Extremity Assessment: Overall WFL for tasks assessed   Lower Extremity Assessment Lower Extremity Assessment: Defer to PT evaluation   Cervical / Trunk Assessment Cervical / Trunk Assessment: Normal   Communication Communication Communication: HOH   Cognition Arousal/Alertness: Awake/alert Behavior During Therapy: WFL for tasks assessed/performed Overall Cognitive Status: Within Functional Limits for tasks assessed                                                Home Living Family/patient expects to be discharged to:: Private residence Living Arrangements: Alone Available Help at Discharge: Family;Available 24 hours/day (children live nearby) Type of Home: House Home Access: Stairs to enter [employer] of Steps: 4-5 Entrance Stairs-Rails: Right;Left;Can reach both Home Layout: One level     Bathroom Shower/Tub: Teacher, early years/pre: Standard  Home Equipment: Other (comment);Walker - 2 wheels;Cane - quad;Wheelchair - manual;Bedside commode (walking stick)   Additional Comments: Pt sponge bathes at baseline      Prior Functioning/Environment Level of Independence: Independent with assistive device(s)        Comments: household ambulator without AD, uses walking stick for outside and short community distances, independent in ADLs        OT Problem List: Decreased activity tolerance       AM-PAC OT "6 Clicks" Daily Activity     Outcome Measure  Help from another person eating meals?: None Help from another person taking care of personal grooming?: None Help from another person toileting, which includes using toliet, bedpan, or urinal?: None Help from another person bathing (including washing, rinsing, drying)?: None Help from another person to put on and taking off regular upper body clothing?: None Help from another person to put on and taking off regular lower body clothing?: None 6 Click Score: 24   End of Session Nurse Communication: Mobility status  Activity Tolerance: Patient tolerated treatment well Patient left: in chair;with call bell/phone within reach  OT Visit Diagnosis: Muscle weakness (generalized) (M62.81)                Time: 7544-9201 OT Time Calculation (min): 15 min Charges:  OT General Charges $OT Visit: 1 Visit OT Evaluation $OT Eval Low Complexity: [place], OTR/L  [phone] 04/08/2020, 7:48 AM

## 2020-04-08 NOTE — Progress Notes (Signed)
SLP Cancellation Note  Patient Details Name: Denise Parrish MRN: [id number] DOB: Feb 09, 1923   Cancelled treatment:       Reason Eval/Treat Not Completed: SLP screened, no needs identified, will sign off; SLP screened Pt in room. Pt denies any changes in swallowing, speech, language, or cognition. MRI negative for acute changes. Pt reports mild reduced sensation on her left cheek and mild residual word finding difficulties from a stroke in January. SLE will be deferred at this time. Reconsult if indicated. SLP will sign off.  Thank you,  Genene Churn, [place]     [place] 04/08/2020, 2:02 PM

## 2020-04-08 NOTE — Plan of Care (Signed)
  Problem: Education: Goal: Knowledge of General Education information will improve Description: Including pain rating scale, medication(s)/side effects and non-pharmacologic comfort measures Outcome: Progressing   Problem: Health Behavior/Discharge Planning: Goal: Ability to manage health-related needs will improve Outcome: Progressing   Problem: Clinical Measurements: Goal: Ability to maintain clinical measurements within normal limits will improve Outcome: Progressing Goal: Will remain free from infection Outcome: Progressing Goal: Diagnostic test results will improve Outcome: Progressing Goal: Respiratory complications will improve Outcome: Progressing Goal: Cardiovascular complication will be avoided Outcome: Progressing   Problem: Activity: Goal: Risk for activity intolerance will decrease Outcome: Progressing   Problem: Nutrition: Goal: Adequate nutrition will be maintained Outcome: Progressing   Problem: Coping: Goal: Level of anxiety will decrease Outcome: Progressing   Problem: Elimination: Goal: Will not experience complications related to bowel motility Outcome: Progressing Goal: Will not experience complications related to urinary retention Outcome: Progressing   Problem: Pain Managment: Goal: General experience of comfort will improve Outcome: Progressing   Problem: Safety: Goal: Ability to remain free from injury will improve Outcome: Progressing   Problem: Skin Integrity: Goal: Risk for impaired skin integrity will decrease Outcome: Progressing   Problem: Education: Goal: Knowledge of disease or condition will improve Outcome: Progressing Goal: Knowledge of secondary prevention will improve Outcome: Progressing Goal: Knowledge of patient specific risk factors addressed and post discharge goals established will improve Outcome: Progressing   Problem: Coping: Goal: Will verbalize positive feelings about self Outcome: Progressing Goal: Will  identify appropriate support needs Outcome: Progressing   Problem: Health Behavior/Discharge Planning: Goal: Ability to manage health-related needs will improve Outcome: Progressing   Problem: Self-Care: Goal: Ability to participate in self-care as condition permits will improve Outcome: Progressing Goal: Verbalization of feelings and concerns over difficulty with self-care will improve Outcome: Progressing Goal: Ability to communicate needs accurately will improve Outcome: Progressing   Problem: Nutrition: Goal: Risk of aspiration will decrease Outcome: Progressing Goal: Dietary intake will improve Outcome: Progressing   Problem: Ischemic Stroke/TIA Tissue Perfusion: Goal: Complications of ischemic stroke/TIA will be minimized Outcome: Progressing

## 2020-04-12 NOTE — Progress Notes (Signed)
Code stroke ct times 1603 exam started 1604 exam finished 9584 images sent to [place] exam complete in [facility] 1607 [place] radiology called

## 2020-04-26 ENCOUNTER — Other Ambulatory Visit: Payer: Self-pay

## 2020-04-26 ENCOUNTER — Encounter (HOSPITAL_COMMUNITY): Payer: Self-pay | Admitting: Emergency Medicine

## 2020-04-26 ENCOUNTER — Inpatient Hospital Stay (HOSPITAL_COMMUNITY)
Admission: EM | Admit: 2020-04-26 | Discharge: 2020-05-07 | DRG: 377 | Disposition: A | Discharge status: Home Health | Payer: Medicare Other | Attending: Internal Medicine | Admitting: Internal Medicine

## 2020-04-26 ENCOUNTER — Inpatient Hospital Stay (HOSPITAL_COMMUNITY): Payer: Medicare Other

## 2020-04-26 DIAGNOSIS — K219 Gastro-esophageal reflux disease without esophagitis: Secondary | ICD-10-CM | POA: Diagnosis present

## 2020-04-26 DIAGNOSIS — Z96641 Presence of right artificial hip joint: Secondary | ICD-10-CM | POA: Diagnosis present

## 2020-04-26 DIAGNOSIS — I350 Nonrheumatic aortic (valve) stenosis: Secondary | ICD-10-CM | POA: Diagnosis present

## 2020-04-26 DIAGNOSIS — Z7189 Other specified counseling: Secondary | ICD-10-CM | POA: Diagnosis not present

## 2020-04-26 DIAGNOSIS — M25572 Pain in left ankle and joints of left foot: Secondary | ICD-10-CM

## 2020-04-26 DIAGNOSIS — I361 Nonrheumatic tricuspid (valve) insufficiency: Secondary | ICD-10-CM | POA: Diagnosis not present

## 2020-04-26 DIAGNOSIS — I6521 Occlusion and stenosis of right carotid artery: Secondary | ICD-10-CM | POA: Diagnosis present

## 2020-04-26 DIAGNOSIS — S82409A Unspecified fracture of shaft of unspecified fibula, initial encounter for closed fracture: Secondary | ICD-10-CM

## 2020-04-26 DIAGNOSIS — M1990 Unspecified osteoarthritis, unspecified site: Secondary | ICD-10-CM | POA: Diagnosis present

## 2020-04-26 DIAGNOSIS — Y92231 Patient bathroom in hospital as the place of occurrence of the external cause: Secondary | ICD-10-CM | POA: Diagnosis not present

## 2020-04-26 DIAGNOSIS — I2510 Atherosclerotic heart disease of native coronary artery without angina pectoris: Secondary | ICD-10-CM | POA: Diagnosis present

## 2020-04-26 DIAGNOSIS — Z8249 Family history of ischemic heart disease and other diseases of the circulatory system: Secondary | ICD-10-CM

## 2020-04-26 DIAGNOSIS — Z79899 Other long term (current) drug therapy: Secondary | ICD-10-CM

## 2020-04-26 DIAGNOSIS — K449 Diaphragmatic hernia without obstruction or gangrene: Secondary | ICD-10-CM | POA: Diagnosis present

## 2020-04-26 DIAGNOSIS — M80862A Other osteoporosis with current pathological fracture, left lower leg, initial encounter for fracture: Secondary | ICD-10-CM | POA: Diagnosis not present

## 2020-04-26 DIAGNOSIS — Z9981 Dependence on supplemental oxygen: Secondary | ICD-10-CM

## 2020-04-26 DIAGNOSIS — R0609 Other forms of dyspnea: Secondary | ICD-10-CM | POA: Diagnosis not present

## 2020-04-26 DIAGNOSIS — Z7902 Long term (current) use of antithrombotics/antiplatelets: Secondary | ICD-10-CM

## 2020-04-26 DIAGNOSIS — E119 Type 2 diabetes mellitus without complications: Secondary | ICD-10-CM

## 2020-04-26 DIAGNOSIS — R0600 Dyspnea, unspecified: Secondary | ICD-10-CM

## 2020-04-26 DIAGNOSIS — E782 Mixed hyperlipidemia: Secondary | ICD-10-CM | POA: Diagnosis not present

## 2020-04-26 DIAGNOSIS — I10 Essential (primary) hypertension: Secondary | ICD-10-CM | POA: Diagnosis not present

## 2020-04-26 DIAGNOSIS — R778 Other specified abnormalities of plasma proteins: Secondary | ICD-10-CM | POA: Diagnosis not present

## 2020-04-26 DIAGNOSIS — Z981 Arthrodesis status: Secondary | ICD-10-CM | POA: Diagnosis not present

## 2020-04-26 DIAGNOSIS — Z8049 Family history of malignant neoplasm of other genital organs: Secondary | ICD-10-CM

## 2020-04-26 DIAGNOSIS — E1122 Type 2 diabetes mellitus with diabetic chronic kidney disease: Secondary | ICD-10-CM | POA: Diagnosis present

## 2020-04-26 DIAGNOSIS — R55 Syncope and collapse: Secondary | ICD-10-CM | POA: Diagnosis not present

## 2020-04-26 DIAGNOSIS — M79605 Pain in left leg: Secondary | ICD-10-CM | POA: Diagnosis present

## 2020-04-26 DIAGNOSIS — Z515 Encounter for palliative care: Secondary | ICD-10-CM | POA: Diagnosis not present

## 2020-04-26 DIAGNOSIS — I700 Atherosclerosis of aorta: Secondary | ICD-10-CM | POA: Diagnosis present

## 2020-04-26 DIAGNOSIS — E785 Hyperlipidemia, unspecified: Secondary | ICD-10-CM | POA: Diagnosis present

## 2020-04-26 DIAGNOSIS — Z20822 Contact with and (suspected) exposure to covid-19: Secondary | ICD-10-CM | POA: Diagnosis present

## 2020-04-26 DIAGNOSIS — I5033 Acute on chronic diastolic (congestive) heart failure: Secondary | ICD-10-CM | POA: Diagnosis not present

## 2020-04-26 DIAGNOSIS — E1142 Type 2 diabetes mellitus with diabetic polyneuropathy: Secondary | ICD-10-CM | POA: Diagnosis present

## 2020-04-26 DIAGNOSIS — F419 Anxiety disorder, unspecified: Secondary | ICD-10-CM | POA: Diagnosis present

## 2020-04-26 DIAGNOSIS — N1832 Chronic kidney disease, stage 3b: Secondary | ICD-10-CM | POA: Diagnosis present

## 2020-04-26 DIAGNOSIS — I248 Other forms of acute ischemic heart disease: Secondary | ICD-10-CM | POA: Diagnosis present

## 2020-04-26 DIAGNOSIS — K317 Polyp of stomach and duodenum: Secondary | ICD-10-CM | POA: Diagnosis present

## 2020-04-26 DIAGNOSIS — Z833 Family history of diabetes mellitus: Secondary | ICD-10-CM

## 2020-04-26 DIAGNOSIS — I340 Nonrheumatic mitral (valve) insufficiency: Secondary | ICD-10-CM | POA: Diagnosis not present

## 2020-04-26 DIAGNOSIS — Z7984 Long term (current) use of oral hypoglycemic drugs: Secondary | ICD-10-CM

## 2020-04-26 DIAGNOSIS — D62 Acute posthemorrhagic anemia: Secondary | ICD-10-CM | POA: Diagnosis present

## 2020-04-26 DIAGNOSIS — K922 Gastrointestinal hemorrhage, unspecified: Secondary | ICD-10-CM | POA: Diagnosis present

## 2020-04-26 DIAGNOSIS — J9601 Acute respiratory failure with hypoxia: Secondary | ICD-10-CM | POA: Diagnosis not present

## 2020-04-26 DIAGNOSIS — Z8673 Personal history of transient ischemic attack (TIA), and cerebral infarction without residual deficits: Secondary | ICD-10-CM

## 2020-04-26 DIAGNOSIS — K5731 Diverticulosis of large intestine without perforation or abscess with bleeding: Principal | ICD-10-CM | POA: Diagnosis present

## 2020-04-26 DIAGNOSIS — Z7982 Long term (current) use of aspirin: Secondary | ICD-10-CM

## 2020-04-26 DIAGNOSIS — E1151 Type 2 diabetes mellitus with diabetic peripheral angiopathy without gangrene: Secondary | ICD-10-CM | POA: Diagnosis present

## 2020-04-26 DIAGNOSIS — D509 Iron deficiency anemia, unspecified: Secondary | ICD-10-CM | POA: Diagnosis present

## 2020-04-26 DIAGNOSIS — W19XXXA Unspecified fall, initial encounter: Secondary | ICD-10-CM | POA: Diagnosis not present

## 2020-04-26 DIAGNOSIS — I130 Hypertensive heart and chronic kidney disease with heart failure and stage 1 through stage 4 chronic kidney disease, or unspecified chronic kidney disease: Secondary | ICD-10-CM | POA: Diagnosis present

## 2020-04-26 DIAGNOSIS — S82892A Other fracture of left lower leg, initial encounter for closed fracture: Secondary | ICD-10-CM

## 2020-04-26 DIAGNOSIS — S82832A Other fracture of upper and lower end of left fibula, initial encounter for closed fracture: Secondary | ICD-10-CM | POA: Diagnosis not present

## 2020-04-26 DIAGNOSIS — I272 Pulmonary hypertension, unspecified: Secondary | ICD-10-CM | POA: Diagnosis present

## 2020-04-26 LAB — ABO/RH: ABO/RH(D): AB POS

## 2020-04-26 LAB — CBC WITH DIFFERENTIAL/PLATELET
Abs Immature Granulocytes: 0.04 10*3/uL (ref 0.00–0.07)
Basophils Absolute: 0 10*3/uL (ref 0.0–0.1)
Basophils Relative: 0 %
Eosinophils Absolute: 0.1 10*3/uL (ref 0.0–0.5)
Eosinophils Relative: 1 %
HCT: 23.4 % — ABNORMAL LOW (ref 36.0–46.0)
Hemoglobin: 7.2 g/dL — ABNORMAL LOW (ref 12.0–15.0)
Immature Granulocytes: 0 %
Lymphocytes Relative: 17 %
Lymphs Abs: 1.7 10*3/uL (ref 0.7–4.0)
MCH: 28.2 pg (ref 26.0–34.0)
MCHC: 30.8 g/dL (ref 30.0–36.0)
MCV: 91.8 fL (ref 80.0–100.0)
Monocytes Absolute: 0.7 10*3/uL (ref 0.1–1.0)
Monocytes Relative: 7 %
Neutro Abs: 7.4 10*3/uL (ref 1.7–7.7)
Neutrophils Relative %: 75 %
Platelets: 231 10*3/uL (ref 150–400)
RBC: 2.55 MIL/uL — ABNORMAL LOW (ref 3.87–5.11)
RDW: 18.3 % — ABNORMAL HIGH (ref 11.5–15.5)
WBC: 9.9 10*3/uL (ref 4.0–10.5)
nRBC: 0 % (ref 0.0–0.2)

## 2020-04-26 LAB — CBG MONITORING, ED
Glucose-Capillary: 108 mg/dL — ABNORMAL HIGH (ref 70–99)
Glucose-Capillary: 98 mg/dL (ref 70–99)
Glucose-Capillary: 158 mg/dL — ABNORMAL HIGH (ref 70–99)

## 2020-04-26 LAB — COMPREHENSIVE METABOLIC PANEL
ALT: 16 U/L (ref 0–44)
ALT: 14 U/L (ref 0–44)
AST: 21 U/L (ref 15–41)
AST: 25 U/L (ref 15–41)
Albumin: 3.3 g/dL — ABNORMAL LOW (ref 3.5–5.0)
Albumin: 3.2 g/dL — ABNORMAL LOW (ref 3.5–5.0)
Alkaline Phosphatase: 57 U/L (ref 38–126)
Alkaline Phosphatase: 57 U/L (ref 38–126)
Anion gap: 8 (ref 5–15)
Anion gap: 8 (ref 5–15)
BUN: 28 mg/dL — ABNORMAL HIGH (ref 8–23)
BUN: 23 mg/dL (ref 8–23)
CO2: 22 mmol/L (ref 22–32)
CO2: 22 mmol/L (ref 22–32)
Calcium: 8.3 mg/dL — ABNORMAL LOW (ref 8.9–10.3)
Calcium: 8.3 mg/dL — ABNORMAL LOW (ref 8.9–10.3)
Chloride: 109 mmol/L (ref 98–111)
Chloride: 109 mmol/L (ref 98–111)
Creatinine, Ser: 0.98 mg/dL (ref 0.44–1.00)
Creatinine, Ser: 0.81 mg/dL (ref 0.44–1.00)
GFR calc Af Amer: 57 mL/min — ABNORMAL LOW (ref >60)
GFR calc Af Amer: >60 mL/min (ref >60)
GFR calc non Af Amer: 49 mL/min — ABNORMAL LOW (ref >60)
GFR calc non Af Amer: >60 mL/min (ref >60)
Glucose, Bld: 167 mg/dL — ABNORMAL HIGH (ref 70–99)
Glucose, Bld: 110 mg/dL — ABNORMAL HIGH (ref 70–99)
Potassium: 4 mmol/L (ref 3.5–5.1)
Potassium: 3.8 mmol/L (ref 3.5–5.1)
Sodium: 139 mmol/L (ref 135–145)
Sodium: 139 mmol/L (ref 135–145)
Total Bilirubin: 0.7 mg/dL (ref 0.3–1.2)
Total Bilirubin: 2.7 mg/dL — ABNORMAL HIGH (ref 0.3–1.2)
Total Protein: 6 g/dL — ABNORMAL LOW (ref 6.5–8.1)
Total Protein: 5.8 g/dL — ABNORMAL LOW (ref 6.5–8.1)

## 2020-04-26 LAB — PREPARE RBC (CROSSMATCH)

## 2020-04-26 LAB — CBC
HCT: 28.1 % — ABNORMAL LOW (ref 36.0–46.0)
Hemoglobin: 8.8 g/dL — ABNORMAL LOW (ref 12.0–15.0)
MCH: 28.4 pg (ref 26.0–34.0)
MCHC: 31.3 g/dL (ref 30.0–36.0)
MCV: 90.6 fL (ref 80.0–100.0)
Platelets: 225 10*3/uL (ref 150–400)
RBC: 3.1 MIL/uL — ABNORMAL LOW (ref 3.87–5.11)
RDW: 17.4 % — ABNORMAL HIGH (ref 11.5–15.5)
WBC: 9.5 10*3/uL (ref 4.0–10.5)
nRBC: 0 % (ref 0.0–0.2)

## 2020-04-26 LAB — HEMOGLOBIN AND HEMATOCRIT, BLOOD
HCT: 27.1 % — ABNORMAL LOW (ref 36.0–46.0)
HCT: 25.4 % — ABNORMAL LOW (ref 36.0–46.0)
Hemoglobin: 8.7 g/dL — ABNORMAL LOW (ref 12.0–15.0)
Hemoglobin: 8 g/dL — ABNORMAL LOW (ref 12.0–15.0)

## 2020-04-26 LAB — POC OCCULT BLOOD, ED: Fecal Occult Bld: POSITIVE — AB

## 2020-04-26 LAB — PROTIME-INR
INR: 1 (ref 0.8–1.2)
Prothrombin Time: 13.2 seconds (ref 11.4–15.2)

## 2020-04-26 LAB — SARS CORONAVIRUS 2 BY RT PCR (HOSPITAL ORDER, PERFORMED IN ~~LOC~~ HOSPITAL LAB): SARS Coronavirus 2: NEGATIVE

## 2020-04-26 LAB — TROPONIN I (HIGH SENSITIVITY)
Troponin I (High Sensitivity): 380 ng/L (ref <18)
Troponin I (High Sensitivity): 431 ng/L (ref <18)
Troponin I (High Sensitivity): 1314 ng/L (ref <18)
Troponin I (High Sensitivity): 1891 ng/L (ref <18)

## 2020-04-26 MED ORDER — ONDANSETRON HCL 4 MG/2ML IJ SOLN: 4.0000 mg | Freq: Four times a day (QID) | INTRAMUSCULAR | Status: DC | PRN

## 2020-04-26 MED ORDER — LORAZEPAM 2 MG/ML IJ SOLN
0.2500 mg | Freq: Four times a day (QID) | INTRAMUSCULAR | Status: DC | PRN
  Administered 2020-04-28 – 2020-05-06 (×9): 0.25 mg via INTRAVENOUS
  Filled 2020-04-26 (×9): qty 1

## 2020-04-26 MED ORDER — INSULIN ASPART 100 UNIT/ML ~~LOC~~ SOLN: 0.0000 [IU] | Freq: Three times a day (TID) | SUBCUTANEOUS | Status: DC

## 2020-04-26 MED ORDER — PANTOPRAZOLE SODIUM 40 MG IV SOLR
40.0000 mg | Freq: Two times a day (BID) | INTRAVENOUS | Status: DC
  Administered 2020-04-26 – 2020-05-02 (×13): 40 mg via INTRAVENOUS
  Filled 2020-04-26 (×13): qty 40

## 2020-04-26 MED ORDER — ACETAMINOPHEN 650 MG RE SUPP: 650.0000 mg | Freq: Four times a day (QID) | RECTAL | Status: DC | PRN

## 2020-04-26 MED ORDER — ONDANSETRON HCL 4 MG PO TABS: 4.0000 mg | ORAL_TABLET | Freq: Four times a day (QID) | ORAL | Status: DC | PRN

## 2020-04-26 MED ORDER — METOPROLOL TARTRATE 5 MG/5ML IV SOLN
5.0000 mg | Freq: Three times a day (TID) | INTRAVENOUS | Status: DC
  Administered 2020-04-26 – 2020-04-29 (×9): 5 mg via INTRAVENOUS
  Filled 2020-04-26 (×10): qty 5

## 2020-04-26 MED ORDER — ACETAMINOPHEN 325 MG PO TABS
650.0000 mg | ORAL_TABLET | Freq: Four times a day (QID) | ORAL | Status: DC | PRN
  Administered 2020-05-01 – 2020-05-02 (×3): 650 mg via ORAL
  Filled 2020-04-26 (×4): qty 2

## 2020-04-26 MED ORDER — SODIUM CHLORIDE 0.9 % IV SOLN
10.0000 mL/h | Freq: Once | INTRAVENOUS | Status: AC
  Administered 2020-04-26: 10 mL/h via INTRAVENOUS

## 2020-04-26 NOTE — H&P (Signed)
History and Physical    Denise Parrish MWU:[id number] DOB: [birth year] DOA: 04/26/2020  PCP: [name], MD  Patient coming from: Home.  I have personally briefly reviewed patient's old medical records in [place]  Chief Complaint: Weakness and melena for 5 days.  HPI: Denise Parrish is a 86 y.o. female with medical history significant of anxiety, aortic sclerosis, cardiac stenosis, history of chest pain palpitations, cholelithiasis, DJD, type 2 diabetes with neuropathy, emphysema, GERD,/hiatal hernia, hyperlipidemia, hypertension, iron deficiency anemia, mild cognitive impairment, overweight with BMI of 27.79 kg/m, osteoporosis, osteoporosis, pneumonia, sigmoid diverticulosis, history of other nonhemorrhagic CVA who is brought to the emergency department by her daughter due to 5 days associated with pallor and weakness.  The patient has been lightheaded at times with fatigue with minimal exertion.  She has had some dyspnea, chest pressure and palpitations.  She has had some mild epigastric discomfort.  She denies nausea, emesis, constipation, but has had dark loose stools.  She denies headache, fever, chills, sore throat, rhinorrhea, PND, orthopnea or recent pitting edema of the lower extremities.  No dysuria, frequency or hematuria.  No polyuria, polydipsia, polyphagia or blurred vision.  ED Course: Initial vital signs were temperature 97.8 F, BP 84, respirations 16, blood pressure 100/58 mmHg and O2 sat 97% on room air.  The patient was started on a 1 PRBC transfusion in the ED.  CBC showed a white count of 9.9, hemoglobin was 7.2 (down from 11.1 g/dL last month), platelets 231.  Fecal occult blood was positive.  PT 13.2 and INR 1.0.  Troponin III 180 and then 431 ng/L.  CMP showed normal electrolytes and calcium corrected to albumin.  Glucose 167, BUN 28 and creatinine 0.98 mg/dL.  Total protein 6.0 and albumin 3.3 g/dL, the rest of the hepatic functions are within normal  range.  Review of Systems: As per HPI otherwise all other systems reviewed and are negative.  Past Medical History:  Diagnosis Date  . Anxiety   . Aortic sclerosis    Long-standing heart murmur  . Carotid stenosis, right 10/16/2019   75% on CTA neck  . Chest pain 2006   +palpitations; minimal CAD at cath in 2005; normal EF; negative stress echo in 09/2008  . Cholelithiasis    on u/s 09/2011  . Degenerative joint disease    Right THA in 1995; cervical discectomy and fusion-2001  . Diabetes ([place])   . Diabetes mellitus, type 2 (HCC)    + neuropathy  . Emphysema   . Gastroesophageal reflux disease   . Hiatal hernia   . Hyperlipidemia   . Hypertension   . IDA (iron deficiency anemia)    labs 09/2011  . Mild cognitive impairment   . Obesity   . Osteoporosis   . Pneumonia   . Sigmoid diverticulosis   . Small bowel lesion    On Given's capsule; Dr Kathleene Hazel 07/2012, no further w/u needed  . Stroke [facility])     Past Surgical History:  Procedure Laterality Date  . ANTERIOR CERVICAL DISCECTOMY  2001   C4-5, allograft, fixation  . BREAST LUMPECTOMY     benign  . CATARACT EXTRACTION W/ INTRAOCULAR LENS IMPLANT  2007   Left  . COLONOSCOPY  2006  . COLONOSCOPY  04/03/12   Dr. Ok Edwards diverticulosis, negative microscopic  colitis   . ENDARTERECTOMY Right 10/21/2019   Procedure: ENDARTERECTOMY CAROTID RIGHT;  Surgeon: Waynetta Sandy, MD;  Location: [place];  Service: Vascular;  Laterality: Right;  .  ESOPHAGOGASTRODUODENOSCOPY  04/03/12   Dr. Jennet Maduro hernia, chronic gastritis on bx  . GIVENS CAPSULE STUDY  05/09/2012   RMR: an unclear raised area of small bowel was noted, with features almost  characteristic of very small polyp. This was without villous  blunting or any evidence of active bleeding; yet, the area of  concern appeared to be erythematous. However, this could simply  be a light reflection on a normal variation of the small bowel. REFERRED TO DR. GILLIAM,  APPT FOR NOVEMBER 2013.   [name] HIP ARTHROPLASTY Right   . PATCH ANGIOPLASTY Right 10/21/2019   Procedure: PATCH ANGIOPLASTY USING Rueben Bash BIOLOGIC PATCH;  Surgeon: Waynetta Sandy, MD;  Location: [place];  Service: Vascular;  Laterality: Right;  . TONSILLECTOMY    . TOTAL HIP ARTHROPLASTY  1995   Right    Social History  reports that she has never smoked. She has never used smokeless tobacco. She reports that she does not drink alcohol and does not use drugs.  No Known Allergies  Family History  Problem Relation Age of Onset  . Cirrhosis Mother        no etoh use  . CAD Father   . Diabetes Mother        + brother  . Heart failure Father   . Hypertension Father        + brother  . Cancer Brother        lung, age 72  . Uterine cancer Sister   . Colon cancer Neg Hx    Prior to Admission medications   Medication Sig Start Date End Date Taking? Authorizing Provider  ALPRAZolam Duanne Moron) 0.5 MG tablet Take 1 tablet (0.5 mg total) by mouth 3 (three) times daily. Patient taking differently: Take 0.5 mg by mouth at bedtime. Occasionally 1 tablet during the day if needed. 12/11/17   Barton Dubois, MD  Ascorbic Acid (VITAMIN C WITH ROSE HIPS) 500 MG tablet Take 500 mg by mouth daily.    [provider]  aspirin 325 MG tablet Take 1 tablet (325 mg total) by mouth daily. 10/22/19   Georgette Shell, MD  atorvastatin (LIPITOR) 40 MG tablet Take 1 tablet (40 mg total) by mouth daily at 6 PM. 10/22/19   Georgette Shell, MD  clopidogrel (PLAVIX) 75 MG tablet Take 1 tablet (75 mg total) by mouth daily. 10/23/19   Georgette Shell, MD  glimepiride (AMARYL) 1 MG tablet Take 1 mg by mouth daily with breakfast.    [provider]  Glycerin-Hypromellose-PEG 400 (VISINE DRY EYE OP) Apply 1 drop to eye 3 (three) times daily as needed (for dry eye relief).     [provider]  meclizine (ANTIVERT) 12.5 MG tablet Take 1 tablet (12.5 mg total) by mouth 3 (three) times  daily as needed for dizziness. 12/14/19   Virgel Manifold, MD  metoprolol tartrate (LOPRESSOR) 25 MG tablet Take 25 mg by mouth 2 (two) times daily.  07/27/16   [provider]  multivitamin-lutein (OCUVITE-LUTEIN) CAPS capsule Take 1 capsule by mouth daily.    [provider]  omeprazole (PRILOSEC) 20 MG capsule Take 20 mg by mouth Daily. 08/05/12   [provider]  VITAMIN E PO Take 1 tablet by mouth daily.    [provider]    Physical Exam: Vitals:   04/26/20 0453 04/26/20 0500 04/26/20 0512 04/26/20 0530  BP: 124/64 (!) 112/38 127/61 130/64  Pulse: 77  79 79  Resp: 18 16 17  17  Temp: (!) 97.5 F (36.4 C) 97.6 F (36.4 C) 97.6 F (36.4 C)   TempSrc: Oral Oral    SpO2: 97% 100%  100%  Weight:      Height:        Constitutional: NAD, calm, comfortable Eyes: PERRL, lids and conjunctivae are pale. ENMT: Mucous membranes are moist. Posterior pharynx clear of any exudate or lesions. Neck: normal, supple, no masses, no thyromegaly Respiratory: clear to auscultation bilaterally, no wheezing, no crackles. Normal respiratory effort. No accessory muscle use.  Cardiovascular: Regular rate and rhythm, positive 2/6 SEM, no rubs / gallops. No extremity edema. 2+ pedal pulses. No carotid bruits.  Abdomen: Nondistended.  BS positive.  Soft, no tenderness, no masses palpated. No hepatosplenomegaly. Musculoskeletal: no clubbing / cyanosis.  Good ROM, no contractures. Normal muscle tone.  Skin: Pallor.  Some areas of ecchymosis on extremities, otherwise no significant rashes, lesions, ulcers on limited dermatological examination. Neurologic: CN 2-12 grossly intact. Sensation intact, DTR normal. Strength 5/5 in all 4.  Psychiatric: Normal judgment and insight. Alert and oriented x 3.  Stated she was anxious earlier, but now seems to have normal mood.   Labs on Admission: I have personally reviewed following labs and imaging studies  CBC: Recent Labs  Lab  04/26/20 0236  WBC 9.9  NEUTROABS 7.4  HGB 7.2*  HCT 23.4*  MCV 91.8  PLT 505    Basic Metabolic Panel: Recent Labs  Lab 04/26/20 0236  NA 139  K 4.0  CL 109  CO2 22  GLUCOSE 167*  BUN 28*  CREATININE 0.98  CALCIUM 8.3*    GFR: Estimated Creatinine Clearance: 35 mL/min (by C-G formula based on SCr of 0.98 mg/dL).  Liver Function Tests: Recent Labs  Lab 04/26/20 0236  AST 21  ALT 16  ALKPHOS 57  BILITOT 0.7  PROT 6.0*  ALBUMIN 3.3*   Radiological Exams on Admission: No results found.  EKG: Independently reviewed. Vent. rate 84 BPM PR interval * ms QRS duration 90 ms QT/QTc 396/469 ms P-R-T axes -36 46 84 Sinus rhythm Prolonged PR interval Repol abnrm, severe global ischemia (LM/MVD  Assessment/Plan Principal Problem:   Acute GI bleeding Observation/stepdown. Keep n.p.o. Continue PRBC transfusion. Monitor H&H. Pantoprazole 40 mg IVP every 12 hours. Hold antiplatelet therapy. Consult gastroenterology.  Active Problems:   Diabetes mellitus, type 2 (HCC) Currently NPO. Hold Amaryl. CBG monitoring every 6 hours.    Anxiety Currently NPO. Lorazepam 0.25 mg IVP every 6 hours as needed.    Essential hypertension Hold antihypertensives in the setting of hypovolemia. Monitor blood pressure    Hyperlipidemia Hold statin. Currently NPO.    DVT prophylaxis: SCDs. Code Status:   Full code. Family Communication: Disposition Plan:   Patient is from:  Home.  Anticipated DC to:  Home.  Anticipated DC date:  04/28/2009  Anticipated DC barriers: Clinical status.  Consults called:  Routine GI consult. Admission status:  Observation/stepdown.  High given active bleed on that elderly patient with multiple medical conditions and elevated troponin.  Severity of Illness:  Reubin Milan MD [facility]  How to contact the [facility] Attending or Consulting provider [place] or covering provider during after hours [place], for this patient?    1. Check the care team in [facility] and look for a) attending/consulting [facility] provider listed and b) the [facility] team listed 2. Log into [email or web address] and use [place]'s universal password to access. If you do not have the password, please contact the hospital  operator. 3. Locate the [facility] provider you are looking for under [facility] and page to a number that you can be directly reached. 4. If you still have difficulty reaching the provider, please page the [facility] (Director on Call) for the Hospitalists listed on amion for assistance.  04/26/2020, 5:53 AM   This document was prepared using [employer] and may contain some unintended transcription errors.

## 2020-04-26 NOTE — Progress Notes (Signed)
*  PRELIMINARY RESULTS* Echocardiogram 2D Echocardiogram has been performed.  Leavy Cella 04/26/2020, 2:45 PM

## 2020-04-26 NOTE — ED Provider Notes (Signed)
[facility] EMERGENCY DEPARTMENT Provider Note   CSN: [id number] Arrival date & time: 04/26/20  0110     History Chief Complaint  Patient presents with  . Rectal Bleeding    Denise Parrish is a 86 y.o. female.  Patient presents to the emergency department for evaluation of generalized weakness.  Patient reports that she has been experiencing black stools for approximately 5 days.  She has been having progressively worsening weakness over this time.  Family feels that she is more pale than she normally is.  Tonight she has been experiencing chest pain and shortness of breath.  She denies any abdominal pain.        Past Medical History:  Diagnosis Date  . Anxiety   . Aortic sclerosis    Long-standing heart murmur  . Carotid stenosis, right 10/16/2019   75% on CTA neck  . Chest pain 2006   +palpitations; minimal CAD at cath in 2005; normal EF; negative stress echo in 09/2008  . Cholelithiasis    on u/s 09/2011  . Degenerative joint disease    Right THA in 1995; cervical discectomy and fusion-2001  . Diabetes ([place])   . Diabetes mellitus, type 2 (HCC)    + neuropathy  . Emphysema   . Gastroesophageal reflux disease   . Hiatal hernia   . Hyperlipidemia   . Hypertension   . IDA (iron deficiency anemia)    labs 09/2011  . Mild cognitive impairment   . Obesity   . Osteoporosis   . Pneumonia   . Sigmoid diverticulosis   . Small bowel lesion    On Given's capsule; Dr Kathleene Hazel 07/2012, no further w/u needed  . Stroke [facility])     Patient Active Problem List   Diagnosis Date Noted  . Focal neurological deficit 04/07/2020  . CVA (cerebral vascular accident) ([place]) 10/17/2019  . TIA (transient ischemic attack) 10/16/2019  . Slurred speech 10/16/2019  . Facial droop 10/16/2019  . Essential hypertension 10/16/2019  . Diabetes ([place]) 10/16/2019  . Stroke ([place]) 10/16/2019  . RUQ pain   . Dehydration   . Ileus ([place]) 12/12/2017  . Enteritis   . Right upper lobe  pneumonia 12/08/2017  . Sepsis ([place]) 12/08/2017  . Lactic acidosis 12/08/2017  . Anxiety 12/08/2017  . Dehydration fever 12/08/2017  . Segmental ileitis of small intestine ([place]) 06/16/2015  . Diverticulosis of colon without hemorrhage 05/25/2015  . SBO (small bowel obstruction) ([place]) 05/23/2015  . Nausea and vomiting in adult   . Nausea vomiting and diarrhea 09/03/2013  . Vomiting 09/03/2013  . Transaminitis 09/03/2013  . Left leg weakness 10/14/2012  . Difficulty in walking(719.7) 10/14/2012  . Small bowel lesion 07/09/2012  . Heme positive stool 03/14/2012  . GERD (gastroesophageal reflux disease) 03/14/2012  . Diarrhea 03/14/2012  . Right carotid bruit 11/01/2011  . Anemia, iron deficiency 10/28/2011  . Palpitations 10/16/2011  . Diabetes mellitus, type 2 ([place])   . Hypertension   . Hyperlipidemia   . Obesity   . Aortic sclerosis     Past Surgical History:  Procedure Laterality Date  . ANTERIOR CERVICAL DISCECTOMY  2001   C4-5, allograft, fixation  . BREAST LUMPECTOMY     benign  . CATARACT EXTRACTION W/ INTRAOCULAR LENS IMPLANT  2007   Left  . COLONOSCOPY  2006  . COLONOSCOPY  04/03/12   Dr. Ok Edwards diverticulosis, negative microscopic  colitis   . ENDARTERECTOMY Right 10/21/2019   Procedure: ENDARTERECTOMY CAROTID RIGHT;  Surgeon: Waynetta Sandy,  MD;  [facility];  Service: Vascular;  Laterality: Right;  . ESOPHAGOGASTRODUODENOSCOPY  04/03/12   Dr. Jennet Maduro hernia, chronic gastritis on bx  . GIVENS CAPSULE STUDY  05/09/2012   RMR: an unclear raised area of small bowel was noted, with features almost  characteristic of very small polyp. This was without villous  blunting or any evidence of active bleeding; yet, the area of  concern appeared to be erythematous. However, this could simply  be a light reflection on a normal variation of the small bowel. REFERRED TO DR. GILLIAM, APPT FOR NOVEMBER 2013.   [name] HIP ARTHROPLASTY Right   . PATCH ANGIOPLASTY  Right 10/21/2019   Procedure: PATCH ANGIOPLASTY USING Rueben Bash BIOLOGIC PATCH;  Surgeon: Waynetta Sandy, MD;  Location: [place];  Service: Vascular;  Laterality: Right;  . TONSILLECTOMY    . TOTAL HIP ARTHROPLASTY  1995   Right     OB History   No obstetric history on file.     Family History  Problem Relation Age of Onset  . Cirrhosis Mother        no etoh use  . CAD Father   . Diabetes Mother        + brother  . Heart failure Father   . Hypertension Father        + brother  . Cancer Brother        lung, age 17  . Uterine cancer Sister   . Colon cancer Neg Hx     Social History   Tobacco Use  . Smoking status: Never Smoker  . Smokeless tobacco: Never Used  . Tobacco comment: Quit x 50 years  Vaping Use  . Vaping Use: Never used  Substance Use Topics  . Alcohol use: Never  . Drug use: Never    Home Medications Prior to Admission medications   Medication Sig Start Date End Date Taking? Authorizing Provider  ALPRAZolam Duanne Moron) 0.5 MG tablet Take 1 tablet (0.5 mg total) by mouth 3 (three) times daily. Patient taking differently: Take 0.5 mg by mouth at bedtime. Occasionally 1 tablet during the day if needed. 12/11/17   Barton Dubois, MD  Ascorbic Acid (VITAMIN C WITH ROSE HIPS) 500 MG tablet Take 500 mg by mouth daily.    [provider]  aspirin 325 MG tablet Take 1 tablet (325 mg total) by mouth daily. 10/22/19   Georgette Shell, MD  atorvastatin (LIPITOR) 40 MG tablet Take 1 tablet (40 mg total) by mouth daily at 6 PM. 10/22/19   Georgette Shell, MD  clopidogrel (PLAVIX) 75 MG tablet Take 1 tablet (75 mg total) by mouth daily. 10/23/19   Georgette Shell, MD  glimepiride (AMARYL) 1 MG tablet Take 1 mg by mouth daily with breakfast.    [provider]  Glycerin-Hypromellose-PEG 400 (VISINE DRY EYE OP) Apply 1 drop to eye 3 (three) times daily as needed (for dry eye relief).     [provider]  meclizine (ANTIVERT) 12.5 MG  tablet Take 1 tablet (12.5 mg total) by mouth 3 (three) times daily as needed for dizziness. 12/14/19   Virgel Manifold, MD  metoprolol tartrate (LOPRESSOR) 25 MG tablet Take 25 mg by mouth 2 (two) times daily.  07/27/16   [provider]  multivitamin-lutein (OCUVITE-LUTEIN) CAPS capsule Take 1 capsule by mouth daily.    [provider]  omeprazole (PRILOSEC) 20 MG capsule Take 20 mg by mouth Daily. 08/05/12   [provider]  VITAMIN  E PO Take 1 tablet by mouth daily.    [provider]    Allergies    Patient has no known allergies.  Review of Systems   Review of Systems  Respiratory: Positive for shortness of breath.   Cardiovascular: Positive for chest pain.  Gastrointestinal: Positive for blood in stool. Negative for abdominal pain.  All other systems reviewed and are negative.   Physical Exam Updated Vital Signs BP (!) 99/47   Pulse 81   Temp 97.8 F (36.6 C) (Oral)   Resp 13   Ht 5\' 5"  (1.651 m)   Wt 75.8 kg   SpO2 98%   BMI 27.79 kg/m   Physical Exam Vitals and nursing note reviewed.  Constitutional:      General: She is not in acute distress.    Appearance: Normal appearance. She is well-developed.  HENT:     Head: Normocephalic and atraumatic.     Right Ear: Hearing normal.     Left Ear: Hearing normal.     Nose: Nose normal.  Eyes:     Conjunctiva/sclera: Conjunctivae normal.     Pupils: Pupils are equal, round, and reactive to light.  Cardiovascular:     Rate and Rhythm: Regular rhythm.     Heart sounds: S1 normal and S2 normal. Murmur heard.  No friction rub. No gallop.   Pulmonary:     Effort: Pulmonary effort is normal. No respiratory distress.     Breath sounds: Normal breath sounds.  Chest:     Chest wall: No tenderness.  Abdominal:     General: Bowel sounds are normal.     Palpations: Abdomen is soft.     Tenderness: There is no abdominal tenderness. There is no guarding or rebound. Negative signs include  Murphy's sign and McBurney's sign.     Hernia: No hernia is present.  Musculoskeletal:        General: Normal range of motion.     Cervical back: Normal range of motion and neck supple.  Skin:    General: Skin is warm and dry.     Findings: No rash.  Neurological:     Mental Status: She is alert and oriented to person, place, and time.     GCS: GCS eye subscore is 4. GCS verbal subscore is 5. GCS motor subscore is 6.     Cranial Nerves: No cranial nerve deficit.     Sensory: No sensory deficit.     Coordination: Coordination normal.  Psychiatric:        Speech: Speech normal.        Behavior: Behavior normal.        Thought Content: Thought content normal.     ED Results / Procedures / Treatments   Labs (all labs ordered are listed, but only abnormal results are displayed) Labs Reviewed  CBC WITH DIFFERENTIAL/PLATELET - Abnormal; Notable for the following components:      Result Value   RBC 2.55 (*)    Hemoglobin 7.2 (*)    HCT 23.4 (*)    RDW 18.3 (*)    All other components within normal limits  COMPREHENSIVE METABOLIC PANEL - Abnormal; Notable for the following components:   Glucose, Bld 167 (*)    BUN 28 (*)    Calcium 8.3 (*)    Total Protein 6.0 (*)    Albumin 3.3 (*)    GFR calc non Af Amer 49 (*)    GFR calc Af Amer 57 (*)  All other components within normal limits  POC OCCULT BLOOD, ED - Abnormal; Notable for the following components:   Fecal Occult Bld POSITIVE (*)    All other components within normal limits  TROPONIN I (HIGH SENSITIVITY) - Abnormal; Notable for the following components:   Troponin I (High Sensitivity) 380 (*)    All other components within normal limits  SARS CORONAVIRUS 2 BY RT PCR (HOSPITAL ORDER, [place] [facility])  PROTIME-INR  TYPE AND SCREEN  ABO/RH  PREPARE RBC (CROSSMATCH)  TROPONIN I (HIGH SENSITIVITY)    EKG EKG Interpretation  Date/Time:  Monday April 26 2020 01:19:25 EDT Ventricular Rate:  84  PR Interval:    QRS Duration: 90 QT Interval:  396 QTC Calculation: 469 R Axis:   46 Text Interpretation: Sinus rhythm Prolonged PR interval Repol abnrm, severe global ischemia (LM/MVD) Confirmed by Orpah Greek [phone]) on 04/26/2020 2:27:17 AM   Radiology No results found.  Procedures Procedures (including critical care time)  Medications Ordered in ED Medications  0.9 %  sodium chloride infusion (has no administration in time range)    ED Course  I have reviewed the triage vital signs and the nursing notes.  Pertinent labs & imaging results that were available during my care of the patient were reviewed by me and considered in my medical decision making (see chart for details).    MDM Rules/Calculators/A&P                          Patient presents to the emergency department for evaluation of suspected GI bleeding.  Patient has been experiencing dark black stools for approximately 5 days.  She has not had any abdominal pain.  Abdominal exam is benign, soft and nontender.  Rectal exam revealed dark maroon stool.  She has not had any vomiting or hematemesis.  Patient's blood pressure soft at arrival.  She was given a small fluid bolus.  She has noted to be anemic with a hemoglobin of 7.2.  She is experiencing chest pain.  EKG does show diffuse ST depressions.  This pattern was seen previously on EKGs but does appear to be more depressed today than in the past.  She does have an elevated troponin.  She has not a candidate for anticoagulation secondary to acute GI bleed.  It is felt that the most immediate treatment she would need would be for the anemia.  Because of this, a unit of blood was ordered and she will require hospitalization for monitoring of troponins as well as serial H&H's.    Records indicate upper and lower endoscopy in 2013 for iron deficiency anemia.  At that time only pathology noted was diverticulosis.  CRITICAL CARE Performed by: Orpah Greek    Total critical care time: 30 minutes  Critical care time was exclusive of separately billable procedures and treating other patients.  Critical care was necessary to treat or prevent imminent or life-threatening deterioration.  Critical care was time spent personally by me on the following activities: development of treatment plan with patient and/or surrogate as well as nursing, discussions with consultants, evaluation of patient's response to treatment, examination of patient, obtaining history from patient or surrogate, ordering and performing treatments and interventions, ordering and review of laboratory studies, ordering and review of radiographic studies, pulse oximetry and re-evaluation of patient's condition.   Final Clinical Impression(s) / ED Diagnoses Final diagnoses:  Acute GI bleeding    Rx / DC  Orders ED Discharge Orders    None       P[place], Gwenyth Allegra, MD 04/26/20 [phone]

## 2020-04-26 NOTE — ED Triage Notes (Signed)
Per EMS, pt daughter states pt has had loose black stool for abour 5 days but has not wanted to go to pcp. Pt reports 2-3 bm yesterday. Pt reports hx of gi bleeds "but not like this." Daughter states pt is paler than normal.

## 2020-04-26 NOTE — ED Notes (Signed)
CRITICAL VALUE ALERT  Critical Value:  Troponin 431 Date & Time Notied: 04/26/20 @ 4373 Provider Notified:Dr Olevia Bowens Orders Received/Actions taken: None yet

## 2020-04-26 NOTE — Care Management Obs Status (Signed)
[place] NOTIFICATION   Patient Details  Name: Denise Parrish MRN: [id number] Date of Birth: 01/02/1923   Medicare Observation Status Notification Given:  Yes    Sherie Don, LCSW 04/26/2020, 11:13 AM

## 2020-04-26 NOTE — Consult Note (Addendum)
Cardiology Consultation:   Patient ID: Denise Parrish MRN: [id number]; DOB: 04-22-1923  Admit date: 04/26/2020 Date of Consult: 04/26/2020  Primary Care Provider: [name], MD [facility] Cardiologist: No primary care provider on file.  [place] Electrophysiologist:  None    Patient Profile:   Denise Parrish is a 86 y.o. female with a hx of mod-severe AS who is being seen today for the evaluation of chest pain at the request of Dr. Jeannetta Nap.  History of Present Illness:   Denise Parrish is a 86 yo female with history of palpitations, minimal CAD on cath 2005, negative stress echo 2010, mod-severe AS on echo 10/17/19 with normal LVEF 60-65% grade 1 DD, HTN, HLD, CVA  Patient brought to ED with 5 days weakness, pallor, fatigue with minimal exertion, chest pain, dyspnea and palpitations. Found to have GI bleed Hgb 8.8, troponins 431, 1314. Patient says she never had chest pain, just shortness of breath. Got up at midnight to take her anxiety med and started vomiting and incontinence of black stools, weak and dizzy.    Past Medical History:  Diagnosis Date  . Anxiety   . Aortic sclerosis    Long-standing heart murmur  . Carotid stenosis, right 10/16/2019   75% on CTA neck  . Chest pain 2006   +palpitations; minimal CAD at cath in 2005; normal EF; negative stress echo in 09/2008  . Cholelithiasis    on u/s 09/2011  . Degenerative joint disease    Right THA in 1995; cervical discectomy and fusion-2001  . Diabetes ([place])   . Diabetes mellitus, type 2 (HCC)    + neuropathy  . Emphysema   . Gastroesophageal reflux disease   . Hiatal hernia   . Hyperlipidemia   . Hypertension   . IDA (iron deficiency anemia)    labs 09/2011  . Mild cognitive impairment   . Obesity   . Osteoporosis   . Pneumonia   . Sigmoid diverticulosis   . Small bowel lesion    On Given's capsule; Dr Kathleene Hazel 07/2012, no further w/u needed  . Stroke [facility])     Past Surgical History:   Procedure Laterality Date  . ANTERIOR CERVICAL DISCECTOMY  2001   C4-5, allograft, fixation  . BREAST LUMPECTOMY     benign  . CATARACT EXTRACTION W/ INTRAOCULAR LENS IMPLANT  2007   Left  . COLONOSCOPY  2006  . COLONOSCOPY  04/03/12   Dr. Ok Edwards diverticulosis, negative microscopic  colitis   . ENDARTERECTOMY Right 10/21/2019   Procedure: ENDARTERECTOMY CAROTID RIGHT;  Surgeon: Waynetta Sandy, MD;  Location: [place];  Service: Vascular;  Laterality: Right;  . ESOPHAGOGASTRODUODENOSCOPY  04/03/12   Dr. Jennet Maduro hernia, chronic gastritis on bx  . GIVENS CAPSULE STUDY  05/09/2012   RMR: an unclear raised area of small bowel was noted, with features almost  characteristic of very small polyp. This was without villous  blunting or any evidence of active bleeding; yet, the area of  concern appeared to be erythematous. However, this could simply  be a light reflection on a normal variation of the small bowel. REFERRED TO DR. GILLIAM, APPT FOR NOVEMBER 2013.   [name] HIP ARTHROPLASTY Right   . PATCH ANGIOPLASTY Right 10/21/2019   Procedure: PATCH ANGIOPLASTY USING Rueben Bash BIOLOGIC PATCH;  Surgeon: Waynetta Sandy, MD;  Location: [place];  Service: Vascular;  Laterality: Right;  . TONSILLECTOMY    . TOTAL HIP ARTHROPLASTY  1995   Right  Home Medications:  Prior to Admission medications   Medication Sig Start Date End Date Taking? Authorizing Provider  ALPRAZolam Duanne Moron) 0.5 MG tablet Take 1 tablet (0.5 mg total) by mouth 3 (three) times daily. Patient taking differently: Take 0.5 mg by mouth at bedtime. Occasionally 1 tablet during the day if needed. 12/11/17   Barton Dubois, MD  Ascorbic Acid (VITAMIN C WITH ROSE HIPS) 500 MG tablet Take 500 mg by mouth daily.    [provider]  aspirin 325 MG tablet Take 1 tablet (325 mg total) by mouth daily. 10/22/19   Georgette Shell, MD  atorvastatin (LIPITOR) 40 MG tablet Take 1 tablet (40 mg total) by mouth daily at 6  PM. 10/22/19   Georgette Shell, MD  clopidogrel (PLAVIX) 75 MG tablet Take 1 tablet (75 mg total) by mouth daily. 10/23/19   Georgette Shell, MD  glimepiride (AMARYL) 1 MG tablet Take 1 mg by mouth daily with breakfast.    [provider]  Glycerin-Hypromellose-PEG 400 (VISINE DRY EYE OP) Apply 1 drop to eye 3 (three) times daily as needed (for dry eye relief).     [provider]  meclizine (ANTIVERT) 12.5 MG tablet Take 1 tablet (12.5 mg total) by mouth 3 (three) times daily as needed for dizziness. 12/14/19   Virgel Manifold, MD  metoprolol tartrate (LOPRESSOR) 25 MG tablet Take 25 mg by mouth 2 (two) times daily.  07/27/16   [provider]  multivitamin-lutein (OCUVITE-LUTEIN) CAPS capsule Take 1 capsule by mouth daily.    [provider]  omeprazole (PRILOSEC) 20 MG capsule Take 20 mg by mouth Daily. 08/05/12   [provider]  VITAMIN E PO Take 1 tablet by mouth daily.    [provider]    Inpatient Medications: Scheduled Meds: . pantoprazole (PROTONIX) IV  40 mg Intravenous Q12H   Continuous Infusions:  PRN Meds: acetaminophen **OR** acetaminophen, LORazepam, ondansetron **OR** ondansetron (ZOFRAN) IV  Allergies:   No Known Allergies  Social History:   Social History   Socioeconomic History  . Marital status: Divorced    Spouse name: Not on file  . Number of children: 4  . Years of education: Not on file  . Highest education level: Not on file  Occupational History  . Occupation: Retired    Comment: [employer]  Tobacco Use  . Smoking status: Never Smoker  . Smokeless tobacco: Never Used  . Tobacco comment: Quit x 50 years  Vaping Use  . Vaping Use: Never used  Substance and Sexual Activity  . Alcohol use: Never  . Drug use: Never  . Sexual activity: Not Currently  Other Topics Concern  . Not on file  Social History Narrative   ** Merged History Encounter **       Social Determinants of Health   Financial  Resource Strain:   . Difficulty of Paying Living Expenses:   Food Insecurity:   . Worried About Charity fundraiser in the Last Year:   . Arboriculturist in the Last Year:   Transportation Needs:   . Film/video editor (Medical):   [name] Lack of Transportation (Non-Medical):   Physical Activity:   . Days of Exercise per Week:   . Minutes of Exercise per Session:   Stress:   . Feeling of Stress :   Social Connections:   . Frequency of Communication with Friends and Family:   . Frequency of Social Gatherings with Friends and Family:   .  Attends Religious Services:   . Active Member of Clubs or Organizations:   . Attends Archivist Meetings:   [name] Marital Status:   Intimate Partner Violence:   . Fear of Current or Ex-Partner:   . Emotionally Abused:   [name] Physically Abused:   . Sexually Abused:     Family History:     Family History  Problem Relation Age of Onset  . Cirrhosis Mother        no etoh use  . CAD Father   . Diabetes Mother        + brother  . Heart failure Father   . Hypertension Father        + brother  . Cancer Brother        lung, age 68  . Uterine cancer Sister   . Colon cancer Neg Hx      ROS:  Please see the history of present illness.  Review of Systems  Constitutional: Positive for malaise/fatigue.  HENT: Negative.   Eyes: Positive for vision loss in left eye and visual disturbance.  Cardiovascular: Positive for dyspnea on exertion and palpitations.  Respiratory: Negative.   Hematologic/Lymphatic: Negative.   Musculoskeletal: Negative.  Negative for joint pain.  Gastrointestinal: Positive for melena, nausea and vomiting.  Genitourinary: Negative.   Neurological: Positive for light-headedness and weakness.  Psychiatric/Behavioral: The patient is nervous/anxious.     All other ROS reviewed and negative.     Physical Exam/Data:   Vitals:   04/26/20 0700 04/26/20 0730 04/26/20 0800 04/26/20 0811  BP: 126/66 (!) 123/59 (!) 122/53 (!)  128/58  Pulse: 79 72 87 (!) 59  Resp: 17 17 18 12   Temp:      TempSrc:      SpO2: 100% 100% 100% 100%  Weight:      Height:        Intake/Output Summary (Last 24 hours) at 04/26/2020 1100 Last data filed at 04/26/2020 0816 Gross per 24 hour  Intake 663.5 ml  Output -  Net 663.5 ml   Last 3 Weights 04/26/2020 04/07/2020 04/07/2020  Weight (lbs) 167 lb 172 lb 9.9 oz 166 lb  Weight (kg) 75.751 kg 78.3 kg 75.297 kg     Body mass index is 27.79 kg/m.  General:  Well nourished, well developed, in no acute distress  HEENT: normal Lymph: no adenopathy Neck: no JVD Endocrine:  No thryomegaly Vascular: No carotid bruits; FA pulses 2+ bilaterally without bruits  Cardiac:  normal S1, S2; RRR; 4/6 harsh systolic murmur LSB Lungs:  clear to auscultation bilaterally, no wheezing, rhonchi or rales  Abd: soft, nontender, no hepatomegaly  Ext: no edema Musculoskeletal:  No deformities, BUE and BLE strength normal and equal Skin: warm and dry  Neuro:  CNs 2-12 intact, no focal abnormalities noted Psych:  Normal affect   EKG:  The EKG was personally reviewed and demonstrates:  NSR with diffuse repolarizations changes/ST changes similar to prior EKG's Telemetry:  Telemetry was personally reviewed and demonstrates:  NSR  Relevant CV Studies: Echo 10/17/19 IMPRESSIONS     1. Left ventricular ejection fraction, by visual estimation, is 60 to  65%. The left ventricle has normal function. There is mildly increased  left ventricular hypertrophy.   2. Elevated left ventricular end-diastolic pressure.   3. Left ventricular diastolic parameters are consistent with Grade I  diastolic dysfunction (impaired relaxation).   4. The left ventricle has no regional wall motion abnormalities.   5. Global right ventricle has normal  systolic function.The right  ventricular size is normal. No increase in right ventricular wall  thickness.   6. Left atrial size was normal.   7. Right atrial size was normal.    8. Moderate mitral annular calcification.   9. The mitral valve is degenerative. Trivial mitral valve regurgitation.  Mild mitral stenosis.  10. The tricuspid valve is grossly normal.  11. The tricuspid valve is grossly normal. Tricuspid valve regurgitation  is mild.  12. The aortic valve is tricuspid. Aortic valve regurgitation is not  visualized. Moderate to severe aortic valve stenosis.  13. The pulmonic valve was grossly normal. Pulmonic valve regurgitation is  not visualized.  14. Moderately elevated pulmonary artery systolic pressure.  15. The inferior vena cava is normal in size with greater than 50%  respiratory variability, suggesting right atrial pressure of 3 mmHg.   FINDINGS   Left Ventricle: Left ventricular ejection fraction, by visual estimation,  is 60 to 65%. The left ventricle has normal function. The left ventricle  has no regional wall motion abnormalities. There is mildly increased left  ventricular hypertrophy.  Concentric left ventricular hypertrophy. Left ventricular diastolic  parameters are consistent with Grade I diastolic dysfunction (impaired  relaxation). Elevated left ventricular end-diastolic pressure.   Right Ventricle: The right ventricular size is normal. No increase in  right ventricular wall thickness. Global RV systolic function is has  normal systolic function. The tricuspid regurgitant velocity is 2.86 m/s,  and with an assumed right atrial pressure   of 10 mmHg, the estimated right ventricular systolic pressure is  moderately elevated at 42.7 mmHg.   Left Atrium: Left atrial size was normal in size.   Right Atrium: Right atrial size was normal in size   Pericardium: There is no evidence of pericardial effusion.   Mitral Valve: The mitral valve is degenerative in appearance. There is  mild thickening of the mitral valve leaflet(s). There is mild  calcification of the mitral valve leaflet(s). Moderate mitral annular  calcification. Trivial  mitral valve regurgitation.  Mild mitral valve stenosis by observation. MV peak gradient, 11.0 mmHg.   Tricuspid Valve: The tricuspid valve is grossly normal. Tricuspid valve  regurgitation is mild.   Aortic Valve: The aortic valve is tricuspid. . There is mild thickening  and mild calcification of the aortic valve. Aortic valve regurgitation is  not visualized. Moderate to severe aortic stenosis is present. Moderate  aortic valve annular calcification.  There is mild thickening of the aortic valve. There is mild calcification  of the aortic valve. Aortic valve mean gradient measures 34.3 mmHg. Aortic  valve peak gradient measures 57.1 mmHg.   Pulmonic Valve: The pulmonic valve was grossly normal. Pulmonic valve  regurgitation is not visualized. Pulmonic regurgitation is not visualized.   Aorta: The aortic root is normal in size and structure.   Venous: The inferior vena cava is normal in size with greater than 50%  respiratory variability, suggesting right atrial pressure of 3 mmHg.   IAS/Shunts: No atrial level shunt detected by color flow Doppler.     LEFT VENTRICLE  PLAX 2D  LVIDd:         3.77 cm Diastology  LVIDs:         2.97 cm LV e' lateral:   5.03 cm/s  LV PW:         1.32 cm LV E/e' lateral: 25.6  LV IVS:        1.04 cm LV e' medial:    4.30 cm/s  LV SV:         27 ml   LV E/e' medial:  30.0  LV SV Index:   14.31     RIGHT VENTRICLE  RV S prime:     10.90 cm/s  TAPSE (M-mode): 2.2 cm   LEFT ATRIUM             Index       RIGHT ATRIUM           Index  LA diam:        3.70 cm 2.04 cm/m  RA Area:     14.30 cm  LA Vol (A2C):   68.1 ml 37.50 ml/m RA Volume:   36.50 ml  20.10 ml/m  LA Vol (A4C):   30.5 ml 16.79 ml/m  LA Biplane Vol: 50.6 ml 27.86 ml/m   AORTIC VALVE  AV Vmax:           377.67 cm/s  AV Vmean:          274.000 cm/s  AV VTI:            0.930 m  AV Peak Grad:      57.1 mmHg  AV Mean Grad:      34.3 mmHg  LVOT Vmax:         139.67 cm/s  LVOT  Vmean:        107.267 cm/s  LVOT VTI:          0.342 m  LVOT/AV VTI ratio: 0.37    AORTA  Ao Root diam: 2.30 cm   MITRAL VALVE                         TRICUSPID VALVE  MV Area (PHT): 1.76 cm              TR Peak grad:   32.7 mmHg  MV Peak grad:  11.0 mmHg             TR Vmax:        286.00 cm/s  MV Mean grad:  4.0 mmHg  MV Vmax:       1.66 m/s              SHUNTS  MV Vmean:      97.4 cm/s             Systemic VTI: 0.34 m  MV VTI:        0.53 m  MV PHT:        124.70 msec  MV Decel Time: 430 msec  MV E velocity: 129.00 cm/s 103 cm/s  MV A velocity: 160.00 cm/s 70.3 cm/s  MV E/A ratio:  0.81        1.5       Laboratory Data:  High Sensitivity Troponin:   Recent Labs  Lab 04/26/20 0236 04/26/20 0346 04/26/20 0853  TROPONINIHS 380* 431* 1,314*     Chemistry Recent Labs  Lab 04/26/20 0236 04/26/20 0853  NA 139 139  K 4.0 3.8  CL 109 109  CO2 22 22  GLUCOSE 167* 110*  BUN 28* 23  CREATININE 0.98 0.81  CALCIUM 8.3* 8.3*  GFRNONAA 49* >60  GFRAA 57* >60  ANIONGAP 8 8    Recent Labs  Lab 04/26/20 0236 04/26/20 0853  PROT 6.0* 5.8*  ALBUMIN 3.3* 3.2*  AST 21 25  ALT 16 14  ALKPHOS 57 57  BILITOT 0.7 2.7*   Hematology Recent Labs  Lab 04/26/20 0236 04/26/20 0853  WBC 9.9 9.5  RBC 2.55* 3.10*  HGB 7.2* 8.8*  HCT 23.4* 28.1*  MCV 91.8 90.6  MCH 28.2 28.4  MCHC 30.8 31.3  RDW 18.3* 17.4*  PLT 231 225   BNPNo results for input(s): BNP, PROBNP in the last 168 hours.  DDimer No results for input(s): DDIMER in the last 168 hours.   Radiology/Studies:  No results found.     TIMI Risk Score for Unstable Angina or Non-ST Elevation MI:   The patient's TIMI risk score is 4, which indicates a 20% risk of all cause mortality, new or recurrent myocardial infarction or need for urgent revascularization in the next 14 days.      Assessment and Plan:   1. Elevated troponins >1300-patient denies chest pain, but ER notes report chest pain. Dysnea on  exertion and palpitations in setting of GI bleed-hgb 7.2 EKG with diffuse ST changes similar to prior EKG's. Given age and GI bleed and lack of chest pain would manage medically at this time. 2. Mod-Severe AS on echo 10/17/19-for repeat in January 3. GI bleed Hgb 7.2 now 8.8 4. CVA 04/08/20 no additional treatment CVA 09/2019 secondary to carotid stenosis s/p RCEA 10/21/19 on ASA/Plavix/statin 5. HTN controlled with metoprolol 25 mg bid 6. HLD on lipitor 40 mg daily      For questions or updates, please contact [place] Please consult [email or web address] for contact info under    Signed, Ermalinda Barrios, PA-C  04/26/2020 11:00 AM   Pt is a 86 yo with hx of Mod/severe AS, HTN, CVA in July 2021, presents with hx of fatigue SOB  In setting of melena and Hgb 24f7.2   Troponin elevated at over 1300.    Pt denies CP   On exam, pt comfortable in bed Neck:  JVP is normal LUngs are Relatively clear  Cardiac exam:   RRR  Gr III/VI later peaking systolic murmur LSB Abd is supple    Ext are without significant edema  Recomm:   Transfuse as needed to keep Hgb greater than 8 Hold ASA and Plavix for now    No plans for intervention given age  Will continue to follow with you.  PDorris CarnesMD

## 2020-04-26 NOTE — Progress Notes (Signed)
Patient Demographics:    Denise Parrish, is a 86 y.o. female, DOB - 08-Aug-1923, QDI:[id number]  Admit date - 04/26/2020   Admitting Physician Analis Distler Denton Brick, MD  Outpatient Primary MD for the patient is [name], MD  LOS - 0   Chief Complaint  Patient presents with  . Rectal Bleeding        Subjective:    Denise Parrish today has no fevers, no emesis,  No chest pain,   C/n dyspnea and abd discomfort Daughter is at bedside, questions answered  Assessment  & Plan :    Principal Problem:   Acute GI bleeding Active Problems:   Diabetes mellitus, type 2 (HCC)   Hyperlipidemia   Anxiety   Essential hypertension  Brief Summary:- 86 yo female with history of palpitations, minimal CAD on cath 2005, negative stress echo 2010, mod-severe AS on echo 10/17/19 with normal LVEF 60-65% grade 1 DD, HTN, DM2, HLD, CVA in January 2021 and TiA in July 2021, status post CEA 10/21/19 admitted on 04/26/2020 with concerns about GI bleed in the setting of aspirin/Plavix use, patient also had dyspnea and subsequently severely elevated troponins    A/p 1) acute symptomatic anemia secondary to ABLA--- presumed acute GI bleed in the setting of aspirin 325/Plavix 75 mg use--- -anesthesia at [facility] uncomfortable with endoluminal evaluation here at this facility with sedation given patient's advanced age, elevated troponins, recent stroke and severe aortic stenosis --Advised transfer to [facility] for further GI and cardiology evaluation  keep hemoglobin above 8, close to 9 if possible given CAD and recent stroke -Baseline Hgb 11.9 (04/07/20),  Hgb today 7.2 >>>> 8.8 (post transfusion)  -Monitor H&H and transfuse as clinically indicated  -IV Protonix as ordered  2)CVA in January 2021 and TiA in July 2021/recent Rt CEA for symptomatic right carotid artery stenosis--- PTA patient was on full dose  aspirin, Plavix and Lipitor --- Aspirin and Plavix on hold currently due to #1 above  3) elevated troponin---- ???  Demand ischemia in the setting of acute GI bleed Trop 380 >>431>>1,314 >>1,891 -Discussed with cardiology service patient may not be a candidate for [facility] due to concerns about GI bleed,  -Echo pending  4)DM2-A1c 7.0 reflecting fair diabetic control PTA,  --hold Amaryl while NPO,  --Use Novolog/Humalog Sliding scale insulin with Accu-Cheks/Fingersticks as ordered   5)HTN--hold oral metoprolol until oral intake resumes - may use IV metoprolol scheduled with hold parameters  6) anxiety disorder--- lorazepam as needed anxiety insomnia  7)HFpEF--patient with chronic grade 1 diastolic dysfunction CHF, moderate pulmonary hypertension and history of moderately severe aortic stenosis--- -please see echo from 10/17/2019 -- this limits therapeutic options and complicates overall care -Repeat echo pending  Disposition/Need for in-Hospital Stay- patient unable to be discharged at this time due to --anesthesia at [facility] uncomfortable with endoluminal evaluation here at this facility with sedation given patient's advanced age, elevated troponins, recent stroke and severe aortic stenosis --Advised transfer to [facility] for further GI and cardiology evaluation   Status is: Inpatient  Remains inpatient appropriate because:-anesthesia at [facility] uncomfortable with endoluminal evaluation here at this facility with sedation given patient's advanced age, elevated troponins, recent stroke and severe aortic stenosis  Disposition: The patient is from: Home              Anticipated d/c is to: SNF              Anticipated d/c date is: 2 days              Patient currently is not medically stable to d/c. Barriers: Not Clinically Stable- anesthesia at [facility] uncomfortable with endoluminal evaluation here at this facility with sedation given patient's  advanced age, elevated troponins, recent stroke and severe aortic stenosis --Advised transfer to [facility] for further GI and cardiology evaluation  Code Status : full  Family Communication:  (patient is alert, awake and coherent)   Consults  :  Cardiology/Gi  DVT Prophylaxis  :   SCDs   Lab Results  Component Value Date   PLT 225 04/26/2020   Inpatient Medications  Scheduled Meds: . pantoprazole (PROTONIX) IV  40 mg Intravenous Q12H   Continuous Infusions: PRN Meds:.acetaminophen **OR** acetaminophen, LORazepam, ondansetron **OR** ondansetron (ZOFRAN) IV    Anti-infectives (From admission, onward)   None        Objective:   Vitals:   04/26/20 0730 04/26/20 0800 04/26/20 0811 04/26/20 1230  BP: (!) 123/59 (!) 122/53 (!) 128/58 (!) 119/58  Pulse: 72 87 (!) 59 70  Resp: 17 18 12 14   Temp:      TempSrc:      SpO2: 100% 100% 100% 100%  Weight:      Height:        Wt Readings from Last 3 Encounters:  04/26/20 75.8 kg  04/07/20 78.3 kg  12/14/19 73.9 kg     Intake/Output Summary (Last 24 hours) at 04/26/2020 1323 Last data filed at 04/26/2020 0816 Gross per 24 hour  Intake 663.5 ml  Output --  Net 663.5 ml     Physical Exam Gen:- Awake Alert,  In no apparent distress  HEENT:- [place].AT, No sclera icterus Neck-Supple Neck, right-sided CEA scar.  Lungs-  CTAB , fair symmetrical air movement CV- S1, S2 normal, regular  Abd-  +ve B.Sounds, Abd Soft, No tenderness,    Extremity/Skin:- No  edema, pedal pulses present  Psych-affect is appropriate, oriented x3 Neuro-generalized weakness, no new focal deficits, no tremors   Data Review:   Micro Results Recent Results (from the past 240 hour(s))  SARS Coronavirus 2 by RT PCR (hospital order, performed in [facility] hospital lab) Nasopharyngeal Nasopharyngeal Swab     Status: None   Collection Time: 04/26/20  1:54 AM   Specimen: Nasopharyngeal Swab  Result Value Ref Range Status   SARS Coronavirus 2  NEGATIVE NEGATIVE Final    Comment: (NOTE) SARS-CoV-2 target nucleic acids are NOT DETECTED.  The SARS-CoV-2 RNA is generally detectable in upper and lower respiratory specimens during the acute phase of infection. The lowest concentration of SARS-CoV-2 viral copies this assay can detect is 250 copies / mL. A negative result does not preclude SARS-CoV-2 infection and should not be used as the sole basis for treatment or other patient management decisions.  A negative result may occur with improper specimen collection / handling, submission of specimen other than nasopharyngeal swab, presence of viral mutation(s) within the areas targeted by this assay, and inadequate number of viral copies (<250 copies / mL). A negative result must be combined with clinical observations, patient history, and epidemiological information.  Fact Sheet for Patients:   StrictlyIdeas.no  Fact Sheet for Healthcare Providers: BankingDealers.co.za  This  test is not yet approved or  cleared by the [place] and has been authorized for detection and/or diagnosis of SARS-CoV-2 by FDA under an Emergency Use Authorization (EUA).  This EUA will remain in effect (meaning this test can be used) for the duration of the COVID-19 declaration under Section 564(b)(1) of the Act, 21 U.S.C. section 360bbb-3(b)(1), unless the authorization is terminated or revoked sooner.  Performed at [facility], [address], [place], [place] [address]     Radiology Reports CT Angio Head W or [place] Contrast  Result Date: 04/07/2020 CLINICAL DATA:  Acute neuro deficit.  Left facial droop. EXAM: CT ANGIOGRAPHY HEAD AND NECK TECHNIQUE: Multidetector CT imaging of the head and neck was performed using the standard protocol during bolus administration of intravenous contrast. Multiplanar CT image reconstructions and MIPs were obtained to evaluate the vascular anatomy. Carotid stenosis  measurements (when applicable) are obtained utilizing NASCET criteria, using the distal internal carotid diameter as the denominator. CONTRAST:  67m OMNIPAQUE IOHEXOL 350 MG/ML SOLN COMPARISON:  CT head 04/07/2020.  MRI 12/10/2019.  CTA 12/14/2019 FINDINGS: CTA NECK FINDINGS Aortic arch: Atherosclerotic calcification in the aortic arch. Proximal great vessels widely patent without stenosis. Right carotid system: Postop right carotid endarterectomy. No significant stenosis, pseudoaneurysm or thrombus. No significant stenosis. Left carotid system: Atherosclerotic calcification left carotid bifurcation without significant stenosis. Vertebral arteries: Both vertebral arteries widely patent to the basilar. Left vertebral artery is dominant. Skeleton: ACDF with solid fusion C4-5. No acute skeletal abnormality. Multilevel degenerative change. Other neck: No mass, adenopathy, or fluid collection in the neck. Upper chest: Apical scarring bilaterally. Apical pleural calcifications. No interval change. Review of the MIP images confirms the above findings CTA HEAD FINDINGS Anterior circulation: Right cavernous carotid widely patent. Atherosclerotic calcification left cavernous carotid with mild stenosis. Anterior and middle cerebral arteries widely patent without stenosis or large vessel occlusion. Posterior circulation: Both vertebral arteries patent to the basilar. PICA patent bilaterally. Basilar widely patent. Superior cerebellar and posterior cerebral arteries patent bilaterally without stenosis or large vessel occlusion. Fetal origin right posterior cerebral artery. Venous sinuses: Normal venous enhancement. Anatomic variants: None Review of the MIP images confirms the above findings IMPRESSION: 1. Right carotid  endarterectomy is widely patent. 2. Mild atherosclerotic calcification left carotid bifurcation without significant stenosis. Mild stenosis left cavernous carotid due to atherosclerotic calcification. 3. No  intracranial large vessel occlusion or significant  stenosis. Electronically Signed   By: CFranchot GalloM.D.   On: 04/07/2020 16:32   CT Angio Neck W and/or Wo Contrast  Result Date: 04/07/2020 CLINICAL DATA:  Acute neuro deficit.  Left facial droop. EXAM: CT ANGIOGRAPHY HEAD AND NECK TECHNIQUE: Multidetector CT imaging of the head and neck was performed using the standard protocol during bolus administration of intravenous contrast. Multiplanar CT image reconstructions and MIPs were obtained to evaluate the vascular anatomy. Carotid stenosis measurements (when applicable) are obtained utilizing NASCET criteria, using the distal internal carotid diameter as the denominator. CONTRAST:  772mOMNIPAQUE IOHEXOL 350 MG/ML SOLN COMPARISON:  CT head 04/07/2020.  MRI 12/10/2019.  CTA 12/14/2019 FINDINGS: CTA NECK FINDINGS Aortic arch: Atherosclerotic calcification in the aortic arch. Proximal great vessels widely patent without stenosis. Right carotid system: Postop right carotid endarterectomy. No significant stenosis, pseudoaneurysm or thrombus. No significant stenosis. Left carotid system: Atherosclerotic calcification left carotid bifurcation without significant stenosis. Vertebral arteries: Both vertebral arteries widely patent to the basilar. Left vertebral artery is dominant. Skeleton: ACDF with solid fusion C4-5. No acute skeletal abnormality.  Multilevel degenerative change. Other neck: No mass, adenopathy, or fluid collection in the neck. Upper chest: Apical scarring bilaterally. Apical pleural calcifications. No interval change. Review of the MIP images confirms the above findings CTA HEAD FINDINGS Anterior circulation: Right cavernous carotid widely patent. Atherosclerotic calcification left cavernous carotid with mild stenosis. Anterior and middle cerebral arteries widely patent without stenosis or large vessel occlusion. Posterior circulation: Both vertebral arteries patent to the basilar. PICA patent  bilaterally. Basilar widely patent. Superior cerebellar and posterior cerebral arteries patent bilaterally without stenosis or large vessel occlusion. Fetal origin right posterior cerebral artery. Venous sinuses: Normal venous enhancement. Anatomic variants: None Review of the MIP images confirms the above findings IMPRESSION: 1. Right carotid  endarterectomy is widely patent. 2. Mild atherosclerotic calcification left carotid bifurcation without significant stenosis. Mild stenosis left cavernous carotid due to atherosclerotic calcification. 3. No intracranial large vessel occlusion or significant  stenosis. Electronically Signed   By: Franchot Gallo M.D.   On: 04/07/2020 16:32   MR BRAIN WO CONTRAST  Result Date: 04/07/2020 CLINICAL DATA:  Transient ischemic attack. Left facial weakness and numbness beginning 1500 hours today. History of right carotid endarterectomy. EXAM: MRI HEAD WITHOUT CONTRAST TECHNIQUE: Multiplanar, multiecho pulse sequences of the brain and surrounding structures were obtained without intravenous contrast. COMPARISON:  CT angiography same day.  MRI 12/14/2019 FINDINGS: Brain: Diffusion imaging does not show any acute or subacute infarction. Old small vessel infarction in the left cerebellum. Chronic small-vessel ischemic changes throughout the pons. Marked chronic small-vessel ischemic changes throughout the cerebral hemispheric white matter. Old right frontoparietal cortical and subcortical infarction. No mass lesion, hemorrhage, hydrocephalus or extra-axial collection. Vascular: Major vessels at the base of the brain show flow. Skull and upper cervical spine: Negative Sinuses/Orbits: Clear/normal Other: None IMPRESSION: No acute finding by MRI. Chronic small-vessel ischemic changes throughout the brain as outlined above. Old right frontoparietal cortical infarction. Electronically Signed   By: Nelson Chimes M.D.   On: 04/07/2020 18:46   CT HEAD CODE STROKE WO CONTRAST  Result Date:  04/07/2020 CLINICAL DATA:  Code stroke. Acute onset of left-sided mouth and facial droop and numbness beginning at 3 p.m. EXAM: CT HEAD WITHOUT CONTRAST TECHNIQUE: Contiguous axial images were obtained from the base of the skull through the vertex without intravenous contrast. COMPARISON:  CT head without contrast 12/14/2019 FINDINGS: Brain: Remote right frontal infarct is stable with associated white matter disease. Diffuse white matter disease is stable. Basal ganglia are stable. Remote lacunar infarcts of the thalami are unchanged. Insular ribbon is normal bilaterally. Remote lacunar infarct of the left cerebellum is stable. Vascular: Atherosclerotic calcifications are present within the cavernous internal carotid arteries bilaterally. No hyperdense vessel is present. Skull: Calvarium is intact. No focal lytic or blastic lesions are present. No significant extracranial soft tissue lesion is present. Sinuses/Orbits: The paranasal sinuses and mastoid air cells are clear. Bilateral lens replacements are noted. Globes and orbits are otherwise unremarkable. ASPECTS [facility] Stroke Program Early CT Score) - Ganglionic level infarction (caudate, lentiform nuclei, internal capsule, insula, M1-M3 cortex): 7/7 - Supraganglionic infarction (M4-M6 cortex): 3/3 Total score (0-10 with 10 being normal): 10/10 IMPRESSION: 1. Stable remote infarcts of the right frontal lobe, bilateral thalami, and left cerebellum. 2. No acute intracranial abnormality. 3. Stable atrophy and white matter disease. 4. ASPECTS is 10/10 These results were called by telephone at the time of interpretation on 04/07/2020 at 4:16 pm to provider [facility] , who verbally acknowledged these results. Electronically Signed   By:  San Morelle M.D.   On: 04/07/2020 16:16     CBC Recent Labs  Lab 04/26/20 0236 04/26/20 0853  WBC 9.9 9.5  HGB 7.2* 8.8*  HCT 23.4* 28.1*  PLT 231 225  MCV 91.8 90.6  MCH 28.2 28.4  MCHC 30.8 31.3  RDW 18.3*  17.4*  LYMPHSABS 1.7  --   MONOABS 0.7  --   EOSABS 0.1  --   BASOSABS 0.0  --     Chemistries  Recent Labs  Lab 04/26/20 0236 04/26/20 0853  NA 139 139  K 4.0 3.8  CL 109 109  CO2 22 22  GLUCOSE 167* 110*  BUN 28* 23  CREATININE 0.98 0.81  CALCIUM 8.3* 8.3*  AST 21 25  ALT 16 14  ALKPHOS 57 57  BILITOT 0.7 2.7*   ------------------------------------------------------------------------------------------------------------------ No results for input(s): CHOL, HDL, LDLCALC, TRIG, CHOLHDL, LDLDIRECT in the last 72 hours.  Lab Results  Component Value Date   HGBA1C 7.0 (H) 04/07/2020   ------------------------------------------------------------------------------------------------------------------ No results for input(s): TSH, T4TOTAL, T3FREE, THYROIDAB in the last 72 hours.  Invalid input(s): FREET3 ------------------------------------------------------------------------------------------------------------------ No results for input(s): VITAMINB12, FOLATE, FERRITIN, TIBC, IRON, RETICCTPCT in the last 72 hours.  Coagulation profile Recent Labs  Lab 04/26/20 0236  INR 1.0    No results for input(s): DDIMER in the last 72 hours.  Cardiac Enzymes No results for input(s): CKMB, TROPONINI, MYOGLOBIN in the last 168 hours.  Invalid input(s): CK ------------------------------------------------------------------------------------------------------------------ No results found for: BNP   Roxan Hockey M.D on 04/26/2020 at 1:23 PM  Go to [email or web address] - for contact info  [facility] - Office  [phone]

## 2020-04-26 NOTE — Consult Note (Addendum)
Referring Provider: Dr. Olevia Bowens Primary Care Physician:  [name], MD Primary Gastroenterologist:  Dr. Gala Romney   Date of Admission: 04/26/20 Date of Consultation: 04/26/20  Reason for Consultation:  GI bleed  HPI:  Denise Parrish is a delightful 86 y.o. year old female with remote history of IDA, small bowel lesion on capsule study Aug 2013 and evaluated by [facility], felt to be benign, presenting with melena and acute blood loss anemia. Hgb in 11 range several weeks ago, now 7.2 this morning. Received 1 unit PRBCs thus far with improvement to 8.8. History of acute CVA in Jan 2021 in setting of carotid artery stenosis, s/p carotid endarterectomy, recent admission July 2021 with acute CVA but MRI brain did not show any acute infarct; felt to have had small infarct not appreciated on MRI. On dual antiplatelet therapy of plavix and aspirin.     Patient is an excellent historian. Lives alone. She has no deficits from prior strokes. States she had noted black, tarry stool for one week. Felt worsening fatigue. Noted shortness of breath. No abdominal pain. Started heaving last night prior to coming to the ED. She reports sparingly taking Ibuprofen for joint pain up until February. No aspirin powders. She has been on a PPI chronically. States she is purposefully trying to lose weight. Some pill dysphagia since initial stroke and occasional solid food dysphagia. Last oral intake yesterday evening (2 yeast rolls). Last dose of aspirin and plavix yesterday morning.   Serial troponins have been done: initially 380 and climbing to 1314 as of this morning. Cardiology consult has been placed. She denies any chest pain currently.   Last EGD in 2013 with chronic gastritis. Some question of Barrett's at time of EGD but pathology benign.   Past Medical History:  Diagnosis Date   Anxiety    Aortic sclerosis    Long-standing heart murmur   Carotid stenosis, right 10/16/2019   75% on CTA neck   Chest  pain 2006   +palpitations; minimal CAD at cath in 2005; normal EF; negative stress echo in 09/2008   Cholelithiasis    on u/s 09/2011   Degenerative joint disease    Right THA in 1995; cervical discectomy and fusion-2001   Diabetes ([place])    Diabetes mellitus, type 2 (HCC)    + neuropathy   Emphysema    Gastroesophageal reflux disease    Hiatal hernia    Hyperlipidemia    Hypertension    IDA (iron deficiency anemia)    labs 09/2011   Mild cognitive impairment    Obesity    Osteoporosis    Pneumonia    Sigmoid diverticulosis    Small bowel lesion    On Given's capsule; Dr Kathleene Hazel 07/2012, no further w/u needed   Stroke [facility])     Past Surgical History:  Procedure Laterality Date   ANTERIOR CERVICAL DISCECTOMY  2001   C4-5, allograft, fixation   BREAST LUMPECTOMY     benign   CATARACT EXTRACTION W/ INTRAOCULAR LENS IMPLANT  2007   Left   COLONOSCOPY  2006   COLONOSCOPY  04/03/12   Dr. Ok Edwards diverticulosis, negative microscopic  colitis    ENDARTERECTOMY Right 10/21/2019   Procedure: ENDARTERECTOMY CAROTID RIGHT;  Surgeon: Waynetta Sandy, MD;  Location: [facility] OR;  Service: Vascular;  Laterality: Right;   ESOPHAGOGASTRODUODENOSCOPY  04/03/12   Dr. Jennet Maduro hernia, chronic gastritis on bx   GIVENS CAPSULE STUDY  05/09/2012   RMR: an unclear raised area  of small bowel was noted, with features almost  characteristic of very small polyp. This was without villous  blunting or any evidence of active bleeding; yet, the area of  concern appeared to be erythematous. However, this could simply  be a light reflection on a normal variation of the small bowel. REFERRED TO DR. GILLIAM, APPT FOR NOVEMBER 2013.    HIP ARTHROPLASTY Right    PATCH ANGIOPLASTY Right 10/21/2019   Procedure: PATCH ANGIOPLASTY USING Rueben Bash BIOLOGIC PATCH;  Surgeon: Waynetta Sandy, MD;  Location: [place];  Service: Vascular;  Laterality: Right;   TONSILLECTOMY     TOTAL HIP ARTHROPLASTY   1995   Right    Prior to Admission medications   Medication Sig Start Date End Date Taking? Authorizing Provider  ALPRAZolam Duanne Moron) 0.5 MG tablet Take 1 tablet (0.5 mg total) by mouth 3 (three) times daily. Patient taking differently: Take 0.5 mg by mouth at bedtime. Occasionally 1 tablet during the day if needed. 12/11/17   Barton Dubois, MD  Ascorbic Acid (VITAMIN C WITH ROSE HIPS) 500 MG tablet Take 500 mg by mouth daily.    [provider]  aspirin 325 MG tablet Take 1 tablet (325 mg total) by mouth daily. 10/22/19   Georgette Shell, MD  atorvastatin (LIPITOR) 40 MG tablet Take 1 tablet (40 mg total) by mouth daily at 6 PM. 10/22/19   Georgette Shell, MD  clopidogrel (PLAVIX) 75 MG tablet Take 1 tablet (75 mg total) by mouth daily. 10/23/19   Georgette Shell, MD  glimepiride (AMARYL) 1 MG tablet Take 1 mg by mouth daily with breakfast.    [provider]  Glycerin-Hypromellose-PEG 400 (VISINE DRY EYE OP) Apply 1 drop to eye 3 (three) times daily as needed (for dry eye relief).     [provider]  meclizine (ANTIVERT) 12.5 MG tablet Take 1 tablet (12.5 mg total) by mouth 3 (three) times daily as needed for dizziness. 12/14/19   Virgel Manifold, MD  metoprolol tartrate (LOPRESSOR) 25 MG tablet Take 25 mg by mouth 2 (two) times daily.  07/27/16   [provider]  multivitamin-lutein (OCUVITE-LUTEIN) CAPS capsule Take 1 capsule by mouth daily.    [provider]  omeprazole (PRILOSEC) 20 MG capsule Take 20 mg by mouth Daily. 08/05/12   [provider]  VITAMIN E PO Take 1 tablet by mouth daily.    [provider]    Current Facility-Administered Medications  Medication Dose Route Frequency Provider Last Rate Last Admin   acetaminophen (TYLENOL) tablet 650 mg  650 mg Oral Q6H PRN Reubin Milan, MD       Or   acetaminophen (TYLENOL) suppository 650 mg  650 mg Rectal Q6H PRN Reubin Milan, MD       LORazepam  (ATIVAN) injection 0.25 mg  0.25 mg Intravenous Q6H PRN Reubin Milan, MD       ondansetron [facility]) tablet 4 mg  4 mg Oral Q6H PRN Reubin Milan, MD       Or   ondansetron [facility]) injection 4 mg  4 mg Intravenous Q6H PRN Reubin Milan, MD       pantoprazole (PROTONIX) injection 40 mg  40 mg Intravenous Q12H Reubin Milan, MD   40 mg at 04/26/20 1028   Current Outpatient Medications  Medication Sig Dispense Refill   ALPRAZolam (XANAX) 0.5 MG tablet Take 1 tablet (0.5 mg total) by mouth 3 (three) times daily. (Patient taking differently:  Take 0.5 mg by mouth at bedtime. Occasionally 1 tablet during the day if needed.) 15 tablet 0   Ascorbic Acid (VITAMIN C WITH ROSE HIPS) 500 MG tablet Take 500 mg by mouth daily.     aspirin 325 MG tablet Take 1 tablet (325 mg total) by mouth daily.     atorvastatin (LIPITOR) 40 MG tablet Take 1 tablet (40 mg total) by mouth daily at 6 PM. 30 tablet 2   clopidogrel (PLAVIX) 75 MG tablet Take 1 tablet (75 mg total) by mouth daily. 30 tablet 2   glimepiride (AMARYL) 1 MG tablet Take 1 mg by mouth daily with breakfast.     Glycerin-Hypromellose-PEG 400 (VISINE DRY EYE OP) Apply 1 drop to eye 3 (three) times daily as needed (for dry eye relief).      meclizine (ANTIVERT) 12.5 MG tablet Take 1 tablet (12.5 mg total) by mouth 3 (three) times daily as needed for dizziness. 20 tablet 0   metoprolol tartrate (LOPRESSOR) 25 MG tablet Take 25 mg by mouth 2 (two) times daily.      multivitamin-lutein (OCUVITE-LUTEIN) CAPS capsule Take 1 capsule by mouth daily.     omeprazole (PRILOSEC) 20 MG capsule Take 20 mg by mouth Daily.     VITAMIN E PO Take 1 tablet by mouth daily.      Allergies as of 04/26/2020   (No Known Allergies)    Family History  Problem Relation Age of Onset   Cirrhosis Mother        no etoh use   CAD Father    Diabetes Mother        + brother   Heart failure Father    Hypertension Father        + brother   Cancer  Brother        lung, age 59   Uterine cancer Sister    Colon cancer Neg Hx     Social History   Socioeconomic History   Marital status: Divorced    Spouse name: Not on file   Number of children: 4   Years of education: Not on file   Highest education level: Not on file  Occupational History   Occupation: Retired    Comment: [employer]  Tobacco Use   Smoking status: Never Smoker   Smokeless tobacco: Never Used   Tobacco comment: Quit x 50 years  Scientific laboratory technician Use: Never used  Substance and Sexual Activity   Alcohol use: Never   Drug use: Never   Sexual activity: Not Currently  Other Topics Concern   Not on file  Social History Narrative   ** Merged History Encounter **       Social Determinants of Health   Financial Resource Strain:    Difficulty of Paying Living Expenses:   Food Insecurity:    Worried About Charity fundraiser in the Last Year:    Arboriculturist in the Last Year:   Transportation Needs:    Film/video editor (Medical):    Lack of Transportation (Non-Medical):   Physical Activity:    Days of Exercise per Week:    Minutes of Exercise per Session:   Stress:    Feeling of Stress :   Social Connections:    Frequency of Communication with Friends and Family:    Frequency of Social Gatherings with Friends and Family:    Attends Religious Services:    Active Member of Clubs or Organizations:  Attends Music therapist:    Marital Status:   Intimate Partner Violence:    Fear of Current or Ex-Partner:    Emotionally Abused:    Physically Abused:    Sexually Abused:     Review of Systems: Gen: see HPI CV: Denies chest pain, heart palpitations, syncope, edema  Resp: see HPI GI: see HPI GU : Denies urinary burning, urinary frequency, urinary incontinence.  MS: see HPI  Derm: Denies rash, itching, dry skin Psych: Denies depression, anxiety,confusion, or memory loss Heme: see HPI  Physical Exam: Vital signs in last 24  hours: Temp:  [97.5 F (36.4 C)-97.8 F (36.6 C)] 97.6 F (36.4 C) (08/09 0512) Pulse Rate:  [59-87] 59 (08/09 0811) Resp:  [12-22] 12 (08/09 0811) BP: (99-136)/(38-66) 128/58 (08/09 0811) SpO2:  [97 %-100 %] 100 % (08/09 0811) Weight:  [75.8 kg] 75.8 kg (08/09 0119)   General:   Alert,  Well-developed, well-nourished, pleasant and cooperative, pale, appears younger than stated age of 57.  Head:  Normocephalic and atraumatic. Ears:  Mild hard of hearing Nose:  No deformity, discharge,  or lesions. Mouth:  Dentures in place Lungs:  Clear throughout to auscultation.    Heart:  S1 S2 present, systolic murmur consistent with aortic stenosis Abdomen:  Soft, obese, nontender. No masses, hepatosplenomegaly. Possible umbilical hernia,  Normal bowel sounds, without guarding, and without rebound.   Rectal:  Deferred  Msk:  Symmetrical without gross deformities.  Extremities:  Without edema. Neurologic:  Alert and  oriented x4 Psych:  Alert and cooperative. Normal mood and affect.  Intake/Output from previous day: 08/08 0701 - 08/09 0700 In: 315 [Blood:315] Out: -  Intake/Output this shift: Total I/O In: 348.5 [I.V.:33.5; Blood:315] Out: -   Lab Results: Recent Labs    04/26/20 0236 04/26/20 0853  WBC 9.9 9.5  HGB 7.2* 8.8*  HCT 23.4* 28.1*  PLT 231 225   BMET Recent Labs    04/26/20 0236 04/26/20 0853  NA 139 139  K 4.0 3.8  CL 109 109  CO2 22 22  GLUCOSE 167* 110*  BUN 28* 23  CREATININE 0.98 0.81  CALCIUM 8.3* 8.3*   LFT Recent Labs    04/26/20 0236 04/26/20 0853  PROT 6.0* 5.8*  ALBUMIN 3.3* 3.2*  AST 21 25  ALT 16 14  ALKPHOS 57 57  BILITOT 0.7 2.7*   PT/INR Recent Labs    04/26/20 0236  LABPROT 13.2  INR 1.0     Impression: Very pleasant 86 year old female, independently lives alone, presenting with approximately week-long history of melena, progressive fatigue, found to have acute blood loss anemia and heme positive stool. Received 1 unit  PRBCs with improvement in Hgb to 8.8 from 7.2; appears her prior Hgb was in the 11 range several weeks ago. History complicated by stroke in Jan 2021 s/p carotid endarterectomy and recent admission July 2021 with acute CVA but MRI brain did not show any obvious infarct and felt to have had small infarct not appreciated on MRI. She has been on dual antiplatelet therapy with aspirin and plavix; she has been maintained on PPI daily as outpatient.  Interestingly, she does endorse Ibuprofen use albeit sparingly up until February 2021. Last EGD in 2013 during evaluation for IDA with chronic gastritis; small bowel also examined at that time with small bowel lesion noted but felt to be benign by Dr. Arsenio Loader at [facility]. Concern for gastritis, PUD, AVMs, less likely malignancy at this point or right-sided colon lesion.  Would benefit from EGD while inpatient; however, her troponins have bumped significantly and cardiology has been consulted.   Will await cardiology evaluation and continue with supportive care until EGD can be performed. Clinically stable from GI standpoint with last evidence of melena prior to admission.    Plan: Remain NPO Cardiology consult pending PPI IV BID Follow H/H Discussed with patient EGD in near future; she is aware of risks and benefits and desires to proceed.  Further recommendations to follow   Annitta Needs, PhD, ANP-BC [facility]      LOS: 0 days    04/26/2020, 10:38 AM  ADDENDUM 8/9 at 1300: Discussed with anesthesia. Patient not a candidate for anesthesia here. Discussed with Dr. Denton Brick regarding need for transfer. Annitta Needs, PhD, ANP-BC [facility]   Attending note: Agree with above.  Perioperative anesthesia risks are prohibitive at our institution.

## 2020-04-26 NOTE — ED Notes (Signed)
Date and time results received: 04/26/20 1224  Test: Troponin Critical Value: 1891  Name of Provider Notified: Angelina Ok  Orders Received? Or Actions Taken?: n/a

## 2020-04-26 NOTE — ED Notes (Signed)
Date and time results received: 04/26/20 3:34 AM (use smartphrase ".now" to insert current time)  Test: troponin Critical Value: 380  Name of Provider Notified: Dr Waverly Ferrari  Orders Received? Or Actions Taken?: no new orders received at this time.

## 2020-04-26 NOTE — ED Notes (Signed)
Date and time results received: 04/26/20 0940  Test: Tpn Critical Value: 2277  Name of Provider Notified: Maurene Capes  Orders Received? Or Actions Taken?: n/a

## 2020-04-27 DIAGNOSIS — Z7189 Other specified counseling: Secondary | ICD-10-CM | POA: Insufficient documentation

## 2020-04-27 DIAGNOSIS — K922 Gastrointestinal hemorrhage, unspecified: Secondary | ICD-10-CM | POA: Diagnosis not present

## 2020-04-27 DIAGNOSIS — Z515 Encounter for palliative care: Secondary | ICD-10-CM | POA: Insufficient documentation

## 2020-04-27 LAB — COMPREHENSIVE METABOLIC PANEL
ALT: 15 U/L (ref 0–44)
AST: 24 U/L (ref 15–41)
Albumin: 3.1 g/dL — ABNORMAL LOW (ref 3.5–5.0)
Alkaline Phosphatase: 60 U/L (ref 38–126)
Anion gap: 7 (ref 5–15)
BUN: 16 mg/dL (ref 8–23)
CO2: 25 mmol/L (ref 22–32)
Calcium: 8.2 mg/dL — ABNORMAL LOW (ref 8.9–10.3)
Chloride: 108 mmol/L (ref 98–111)
Creatinine, Ser: 0.82 mg/dL (ref 0.44–1.00)
GFR calc Af Amer: >60 mL/min (ref >60)
GFR calc non Af Amer: >60 mL/min (ref >60)
Glucose, Bld: 111 mg/dL — ABNORMAL HIGH (ref 70–99)
Potassium: 4.2 mmol/L (ref 3.5–5.1)
Sodium: 140 mmol/L (ref 135–145)
Total Bilirubin: 1.6 mg/dL — ABNORMAL HIGH (ref 0.3–1.2)
Total Protein: 5.6 g/dL — ABNORMAL LOW (ref 6.5–8.1)

## 2020-04-27 LAB — ECHOCARDIOGRAM COMPLETE
AV Mean grad: 28.3 mmHg
AV Peak grad: 48.7 mmHg
Ao pk vel: 3.49 m/s
Area-P 1/2: 4.15 cm2
Height: 65 in
MV M vel: 6.42 m/s
MV Peak grad: 164.9 mmHg
S' Lateral: 2.22 cm
Weight: 2672 oz

## 2020-04-27 LAB — TYPE AND SCREEN
ABO/RH(D): AB POS
Antibody Screen: NEGATIVE
Unit division: 0

## 2020-04-27 LAB — CBG MONITORING, ED
Glucose-Capillary: 115 mg/dL — ABNORMAL HIGH (ref 70–99)
Glucose-Capillary: 117 mg/dL — ABNORMAL HIGH (ref 70–99)
Glucose-Capillary: 132 mg/dL — ABNORMAL HIGH (ref 70–99)

## 2020-04-27 LAB — CBC
HCT: 26.9 % — ABNORMAL LOW (ref 36.0–46.0)
Hemoglobin: 8.5 g/dL — ABNORMAL LOW (ref 12.0–15.0)
MCH: 28.5 pg (ref 26.0–34.0)
MCHC: 31.6 g/dL (ref 30.0–36.0)
MCV: 90.3 fL (ref 80.0–100.0)
Platelets: 206 10*3/uL (ref 150–400)
RBC: 2.98 MIL/uL — ABNORMAL LOW (ref 3.87–5.11)
RDW: 18.4 % — ABNORMAL HIGH (ref 11.5–15.5)
WBC: 9.2 10*3/uL (ref 4.0–10.5)
nRBC: 0 % (ref 0.0–0.2)

## 2020-04-27 LAB — BPAM RBC
Blood Product Expiration Date: 202108132359
ISSUE DATE / TIME: 202108090442
Unit Type and Rh: 600

## 2020-04-27 LAB — GLUCOSE, CAPILLARY: Glucose-Capillary: 118 mg/dL — ABNORMAL HIGH (ref 70–99)

## 2020-04-27 NOTE — ED Notes (Signed)
[facility] on unit

## 2020-04-27 NOTE — Progress Notes (Addendum)
Patient Demographics:    Denise Parrish, is a 86 y.o. female, DOB - 1923/05/03, SEL:[id number]  Admit date - 04/26/2020   Admitting Physician Vaani Morren Denton Brick, MD  Outpatient Primary MD for the patient is Denise Andrea, MD  LOS - 1   Chief Complaint  Patient presents with  . Rectal Bleeding        Subjective:    [name] today has no fevers, no emesis,  No chest pain,   Denies abd pain --No BM since admit No shob  -Requesting clear liquid diet if possible ---Patient will need endoluminal evaluation to Help determine if and when DAPT therapy is restarted  Assessment  & Plan :    Principal Problem:   Acute GI bleeding Active Problems:   Diabetes mellitus, type 2 ([place])   Hyperlipidemia   Anxiety   Essential hypertension  Brief Summary:- 86 yo female with history of palpitations, minimal CAD on cath 2005, negative stress echo 2010, mod-severe AS on echo 10/17/19 with normal LVEF 60-65% grade 1 DD, HTN, DM2, HLD, CVA in January 2021 and TiA in July 2021, status post CEA 10/21/19 admitted on 04/26/2020 with concerns about GI bleed in the setting of aspirin/Plavix use, patient also had dyspnea and subsequently severely elevated troponins    A/p 1)Acute symptomatic Anemia secondary to ABLA--- presumed acute GI bleed in the setting of Aspirin 325/Plavix 75 mg use--- -anesthesia at [facility] uncomfortable with endoluminal evaluation here at this facility with sedation given patient's advanced age, elevated troponins, recent stroke and severe aortic stenosis --Advised transfer to [facility] for further GI and cardiology evaluation  keep hemoglobin above 8, close to 9 if possible given CAD and recent stroke -Baseline Hgb 11.9 (04/07/20),  Hgb was 7.2 on admission >>>> Currently Hgb is staying >>> 8.5 (post transfusion)  -Monitor H&H and transfuse as clinically indicated -IV  Protonix 40 mg every 12 hours --Patient will need endoluminal evaluation to Help determine if and when DAPT therapy is restarted  2)CVA in January 2021 and TiA in July 2021/recent Rt CEA for symptomatic right carotid artery stenosis--- PTA patient was on full dose aspirin, Plavix and Lipitor --- Aspirin and Plavix on hold currently due to #1 above  3)Elevated Troponin---- ???  Demand ischemia in the setting of acute GI bleed Trop 380 >>431>>1,314 >>1,891 -Discussed with cardiology service patient is Not be a candidate for LHC due to concerns about GI bleed, -Medical management advised,cardiologist says no cardiology/cardiac intervention is planned -Echo with preserved EF of 60 to 65% with grade 2 diastolic dysfunction with moderately elevated pulmonary artery systolic pressure  3)HW8-S1U 7.0 reflecting fair diabetic control PTA,  --hold Amaryl while NPO,  --Use Novolog/Humalog Sliding scale insulin with Accu-Cheks/Fingersticks as ordered   5)HTN--hold oral metoprolol until oral intake is reliable -Continue IV metoprolol scheduled with hold parameters  6) anxiety disorder--- lorazepam as needed anxiety insomnia  7)HFpEF--patient with chronic grade 2 diastolic dysfunction CHF, moderate pulmonary hypertension and history of moderately severe aortic stenosis--- - Echo with preserved EF of 60 to 65% with grade 2 diastolic dysfunction with moderately elevated pulmonary artery systolic pressure -- this limits therapeutic options and complicates overall care  Disposition/Need for in-Hospital Stay- patient unable to be discharged at this  time due to --anesthesia at [facility] uncomfortable with endoluminal evaluation here at this facility with sedation given patient's advanced age, elevated troponins, recent stroke and severe aortic stenosis --Advised transfer to [facility] for further GI and cardiology evaluation   Status is: Inpatient  Remains inpatient appropriate  because:-anesthesia at [facility] uncomfortable with endoluminal evaluation here at this facility with sedation given patient's advanced age, elevated troponins, recent stroke and severe aortic stenosis   Disposition: The patient is from: Home              Anticipated d/c is to: SNF              Anticipated d/c date is: 2 days              Patient currently is not medically stable to d/c. Barriers: Not Clinically Stable- anesthesia at [facility] uncomfortable with endoluminal evaluation here at this facility with sedation given patient's advanced age, elevated troponins, recent stroke and severe aortic stenosis --Advised transfer to [facility] for further GI and cardiology evaluation  Code Status : full  Family Communication:  (patient is alert, awake and coherent)   Consults  :  Cardiology/Gi  DVT Prophylaxis  :   SCDs   Lab Results  Component Value Date   PLT 206 04/27/2020   Inpatient Medications  Scheduled Meds: . insulin aspart  0-6 Units Subcutaneous TID WC  . metoprolol tartrate  5 mg Intravenous Q8H  . pantoprazole (PROTONIX) IV  40 mg Intravenous Q12H   Continuous Infusions: PRN Meds:.acetaminophen **OR** acetaminophen, LORazepam, ondansetron **OR** ondansetron (ZOFRAN) IV    Anti-infectives (From admission, onward)   None        Objective:   Vitals:   04/27/20 1328 04/27/20 1329 04/27/20 1330 04/27/20 1331  BP:   (!) 117/41   Pulse: 71 67 65 67  Resp: 11 (!) 22 15 18   Temp:      TempSrc:      SpO2: 100% 100% 100% 100%  Weight:      Height:        Wt Readings from Last 3 Encounters:  04/26/20 75.8 kg  04/07/20 78.3 kg  12/14/19 73.9 kg    No intake or output data in the 24 hours ending 04/27/20 1556   Physical Exam Gen:- Awake Alert,  In no apparent distress  HEENT:- [place].AT, No sclera icterus Neck-Supple Neck, right-sided CEA scar.  Lungs-  CTAB , fair symmetrical air movement CV- S1, S2 normal, regular  Abd-  +ve  B.Sounds, Abd Soft, No tenderness,    Extremity/Skin:- No  edema, pedal pulses present  Psych-affect is appropriate, oriented x3 Neuro-generalized weakness, no new focal deficits, no tremors   Data Review:   Micro Results Recent Results (from the past 240 hour(s))  SARS Coronavirus 2 by RT PCR (hospital order, performed in [facility] hospital lab) Nasopharyngeal Nasopharyngeal Swab     Status: None   Collection Time: 04/26/20  1:54 AM   Specimen: Nasopharyngeal Swab  Result Value Ref Range Status   SARS Coronavirus 2 NEGATIVE NEGATIVE Final    Comment: (NOTE) SARS-CoV-2 target nucleic acids are NOT DETECTED.  The SARS-CoV-2 RNA is generally detectable in upper and lower respiratory specimens during the acute phase of infection. The lowest concentration of SARS-CoV-2 viral copies this assay can detect is 250 copies / mL. A negative result does not preclude SARS-CoV-2 infection and should not be used as the sole basis for treatment  or other patient management decisions.  A negative result may occur with improper specimen collection / handling, submission of specimen other than nasopharyngeal swab, presence of viral mutation(s) within the areas targeted by this assay, and inadequate number of viral copies (<250 copies / mL). A negative result must be combined with clinical observations, patient history, and epidemiological information.  Fact Sheet for Patients:   StrictlyIdeas.no  Fact Sheet for Healthcare Providers: BankingDealers.co.za  This test is not yet approved or  cleared by the [place] FDA and has been authorized for detection and/or diagnosis of SARS-CoV-2 by FDA under an Emergency Use Authorization (EUA).  This EUA will remain in effect (meaning this test can be used) for the duration of the COVID-19 declaration under Section 564(b)(1) of the Act, 21 U.S.C. section 360bbb-3(b)(1), unless the authorization is  terminated or revoked sooner.  Performed at [facility], [address] [place], [place] [address]     Radiology Reports CT Angio Head W or [place] Contrast  Result Date: 04/07/2020 CLINICAL DATA:  Acute neuro deficit.  Left facial droop. EXAM: CT ANGIOGRAPHY HEAD AND NECK TECHNIQUE: Multidetector CT imaging of the head and neck was performed using the standard protocol during bolus administration of intravenous contrast. Multiplanar CT image reconstructions and MIPs were obtained to evaluate the vascular anatomy. Carotid stenosis measurements (when applicable) are obtained utilizing NASCET criteria, using the distal internal carotid diameter as the denominator. CONTRAST:  1m OMNIPAQUE IOHEXOL 350 MG/ML SOLN COMPARISON:  CT head 04/07/2020.  MRI 12/10/2019.  CTA 12/14/2019 FINDINGS: CTA NECK FINDINGS Aortic arch: Atherosclerotic calcification in the aortic arch. Proximal great vessels widely patent without stenosis. Right carotid system: Postop right carotid endarterectomy. No significant stenosis, pseudoaneurysm or thrombus. No significant stenosis. Left carotid system: Atherosclerotic calcification left carotid bifurcation without significant stenosis. Vertebral arteries: Both vertebral arteries widely patent to the basilar. Left vertebral artery is dominant. Skeleton: ACDF with solid fusion C4-5. No acute skeletal abnormality. Multilevel degenerative change. Other neck: No mass, adenopathy, or fluid collection in the neck. Upper chest: Apical scarring bilaterally. Apical pleural calcifications. No interval change. Review of the MIP images confirms the above findings CTA HEAD FINDINGS Anterior circulation: Right cavernous carotid widely patent. Atherosclerotic calcification left cavernous carotid with mild stenosis. Anterior and middle cerebral arteries widely patent without stenosis or large vessel occlusion. Posterior circulation: Both vertebral arteries patent to the basilar. PICA patent bilaterally.  Basilar widely patent. Superior cerebellar and posterior cerebral arteries patent bilaterally without stenosis or large vessel occlusion. Fetal origin right posterior cerebral artery. Venous sinuses: Normal venous enhancement. Anatomic variants: None Review of the MIP images confirms the above findings IMPRESSION: 1. Right carotid  endarterectomy is widely patent. 2. Mild atherosclerotic calcification left carotid bifurcation without significant stenosis. Mild stenosis left cavernous carotid due to atherosclerotic calcification. 3. No intracranial large vessel occlusion or significant  stenosis. Electronically Signed   By: CFranchot GalloM.D.   On: 04/07/2020 16:32   CT Angio Neck W and/or Wo Contrast  Result Date: 04/07/2020 CLINICAL DATA:  Acute neuro deficit.  Left facial droop. EXAM: CT ANGIOGRAPHY HEAD AND NECK TECHNIQUE: Multidetector CT imaging of the head and neck was performed using the standard protocol during bolus administration of intravenous contrast. Multiplanar CT image reconstructions and MIPs were obtained to evaluate the vascular anatomy. Carotid stenosis measurements (when applicable) are obtained utilizing NASCET criteria, using the distal internal carotid diameter as the denominator. CONTRAST:  745mOMNIPAQUE IOHEXOL 350 MG/ML SOLN COMPARISON:  CT head 04/07/2020.  MRI 12/10/2019.  CTA 12/14/2019 FINDINGS: CTA NECK FINDINGS Aortic arch: Atherosclerotic calcification in the aortic arch. Proximal great vessels widely patent without stenosis. Right carotid system: Postop right carotid endarterectomy. No significant stenosis, pseudoaneurysm or thrombus. No significant stenosis. Left carotid system: Atherosclerotic calcification left carotid bifurcation without significant stenosis. Vertebral arteries: Both vertebral arteries widely patent to the basilar. Left vertebral artery is dominant. Skeleton: ACDF with solid fusion C4-5. No acute skeletal abnormality. Multilevel degenerative change. Other  neck: No mass, adenopathy, or fluid collection in the neck. Upper chest: Apical scarring bilaterally. Apical pleural calcifications. No interval change. Review of the MIP images confirms the above findings CTA HEAD FINDINGS Anterior circulation: Right cavernous carotid widely patent. Atherosclerotic calcification left cavernous carotid with mild stenosis. Anterior and middle cerebral arteries widely patent without stenosis or large vessel occlusion. Posterior circulation: Both vertebral arteries patent to the basilar. PICA patent bilaterally. Basilar widely patent. Superior cerebellar and posterior cerebral arteries patent bilaterally without stenosis or large vessel occlusion. Fetal origin right posterior cerebral artery. Venous sinuses: Normal venous enhancement. Anatomic variants: None Review of the MIP images confirms the above findings IMPRESSION: 1. Right carotid  endarterectomy is widely patent. 2. Mild atherosclerotic calcification left carotid bifurcation without significant stenosis. Mild stenosis left cavernous carotid due to atherosclerotic calcification. 3. No intracranial large vessel occlusion or significant  stenosis. Electronically Signed   By: Franchot Gallo M.D.   On: 04/07/2020 16:32   MR BRAIN WO CONTRAST  Result Date: 04/07/2020 CLINICAL DATA:  Transient ischemic attack. Left facial weakness and numbness beginning 1500 hours today. History of right carotid endarterectomy. EXAM: MRI HEAD WITHOUT CONTRAST TECHNIQUE: Multiplanar, multiecho pulse sequences of the brain and surrounding structures were obtained without intravenous contrast. COMPARISON:  CT angiography same day.  MRI 12/14/2019 FINDINGS: Brain: Diffusion imaging does not show any acute or subacute infarction. Old small vessel infarction in the left cerebellum. Chronic small-vessel ischemic changes throughout the pons. Marked chronic small-vessel ischemic changes throughout the cerebral hemispheric white matter. Old right  frontoparietal cortical and subcortical infarction. No mass lesion, hemorrhage, hydrocephalus or extra-axial collection. Vascular: Major vessels at the base of the brain show flow. Skull and upper cervical spine: Negative Sinuses/Orbits: Clear/normal Other: None IMPRESSION: No acute finding by MRI. Chronic small-vessel ischemic changes throughout the brain as outlined above. Old right frontoparietal cortical infarction. Electronically Signed   By: Nelson Chimes M.D.   On: 04/07/2020 18:46   ECHOCARDIOGRAM COMPLETE  Result Date: 04/27/2020    ECHOCARDIOGRAM REPORT   Patient Name:   BETINA PUCKETT Date of Exam: 04/26/2020 Medical Rec #:  [id number]          Height:       65.0 in Accession #:    [id number]         Weight:       167.0 lb Date of Birth:  [birth year]           BSA:          1.832 m Patient Age:    89 years           BP:           119/58 mmHg Patient Gender: F                  HR:           70 bpm. Exam Location:  [facility] Procedure: 2D Echo Indications:    Abnormal ECG 794.31 / R94.31  History:  Patient has prior history of Echocardiogram examinations, most                 recent 10/17/2019. TIA and Stroke; Risk Factors:Diabetes,                 Dyslipidemia, Hypertension and Non-Smoker.  Sonographer:    Leavy Cella RDCS (AE) Referring Phys: [id number] Kaslyn [place] IMPRESSIONS  1. Left ventricular ejection fraction, by estimation, is 60 to 65%. The left ventricle has normal function. The left ventricle has no regional wall motion abnormalities. There is mild left ventricular hypertrophy. Left ventricular diastolic parameters are consistent with Grade II diastolic dysfunction (pseudonormalization). Elevated left atrial pressure.  2. Right ventricular systolic function is normal. The right ventricular size is normal. There is moderately elevated pulmonary artery systolic pressure.  3. The mitral valve is abnormal. Mild mitral valve regurgitation.  4. AV is thickened, calcified with restricted  motion. Peak and mean gradients through the vlave are 49 and 28 mm respectively consistent with moderate AS. Dimensionless index is 0.37. Compareed to echo from Jan , gradients are decreased.. The aortic valve is abnormal. Aortic valve regurgitation is not visualized. Mild aortic valve sclerosis is present, with no evidence of aortic valve stenosis.  5. The inferior vena cava is normal in size with greater than 50% respiratory variability, suggesting right atrial pressure of 3 mmHg. FINDINGS  Left Ventricle: Left ventricular ejection fraction, by estimation, is 60 to 65%. The left ventricle has normal function. The left ventricle has no regional wall motion abnormalities. The left ventricular internal cavity size was small. There is mild left ventricular hypertrophy. Left ventricular diastolic parameters are consistent with Grade II diastolic dysfunction (pseudonormalization). Elevated left atrial pressure. Right Ventricle: The right ventricular size is normal. Right vetricular wall thickness was not assessed. Right ventricular systolic function is normal. There is moderately elevated pulmonary artery systolic pressure. The tricuspid regurgitant velocity is  3.27 m/s, and with an assumed right atrial pressure of 10 mmHg, the estimated right ventricular systolic pressure is 32.5 mmHg. Left Atrium: Left atrial size was normal in size. Right Atrium: Right atrial size was normal in size. Pericardium: There is no evidence of pericardial effusion. Mitral Valve: The mitral valve is abnormal. There is mild thickening of the mitral valve leaflet(s). Moderate mitral annular calcification. Mild mitral valve regurgitation. MV peak gradient, 18.5 mmHg. The mean mitral valve gradient is 6.0 mmHg. Tricuspid Valve: The tricuspid valve is normal in structure. Tricuspid valve regurgitation is mild. Aortic Valve: AV is thickened, calcified with restricted motion. Peak and mean gradients through the vlave are 49 and 28 mm respectively  consistent with moderate AS. Dimensionless index is 0.37. Compareed to echo from Jan , gradients are decreased. The aortic valve is abnormal. Aortic valve regurgitation is not visualized. Mild aortic valve sclerosis is present, with no evidence of aortic valve stenosis. Aortic valve mean gradient measures 28.3 mmHg. Aortic valve peak gradient measures 48.7 mmHg. Pulmonic Valve: The pulmonic valve was not well visualized. Pulmonic valve regurgitation is not visualized. No evidence of pulmonic stenosis. Aorta: The aortic root is normal in size and structure. Venous: The inferior vena cava is normal in size with greater than 50% respiratory variability, suggesting right atrial pressure of 3 mmHg. IAS/Shunts: The interatrial septum was not assessed.  LEFT VENTRICLE PLAX 2D LVIDd:         3.33 cm Diastology LVIDs:         2.22 cm LV e' lateral:   5.70 cm/s LV  PW:         1.35 cm LV E/e' lateral: 35.1 LV IVS:        1.15 cm LV e' medial:    4.88 cm/s                        LV E/e' medial:  41.0  RIGHT VENTRICLE RV S prime:     16.20 cm/s TAPSE (M-mode): 2.4 cm LEFT ATRIUM             Index       RIGHT ATRIUM           Index LA diam:        3.90 cm 2.13 cm/m  RA Area:     13.20 cm LA Vol (A2C):   31.9 ml 17.41 ml/m RA Volume:   32.40 ml  17.68 ml/m LA Vol (A4C):   38.8 ml 21.18 ml/m LA Biplane Vol: 36.2 ml 19.76 ml/m  AORTIC VALVE AV Vmax:           349.00 cm/s AV Vmean:          251.667 cm/s AV VTI:            0.812 m AV Peak Grad:      48.7 mmHg AV Mean Grad:      28.3 mmHg LVOT Vmax:         133.33 cm/s LVOT Vmean:        93.867 cm/s LVOT VTI:          0.299 m LVOT/AV VTI ratio: 0.37  AORTA Ao Root diam: 3.40 cm MITRAL VALVE                TRICUSPID VALVE MV Area (PHT): 4.15 cm     TR Peak grad:   42.8 mmHg MV Peak grad:  18.5 mmHg    TR Vmax:        327.00 cm/s MV Mean grad:  6.0 mmHg MV Vmax:       2.15 m/s     SHUNTS MV Vmean:      113.0 cm/s   Systemic VTI: 0.30 m MV Decel Time: 183 msec MR Peak grad: 164.9  mmHg MR Mean grad: 88.0 mmHg MR Vmax:      642.00 cm/s MR Vmean:     421.0 cm/s MV E velocity: 200.00 cm/s MV A velocity: 196.00 cm/s MV E/A ratio:  1.02 Dorris Carnes MD Electronically signed by Dorris Carnes MD Signature Date/Time: 04/27/2020/12:16:06 AM    Final    CT HEAD CODE STROKE WO CONTRAST  Result Date: 04/07/2020 CLINICAL DATA:  Code stroke. Acute onset of left-sided mouth and facial droop and numbness beginning at 3 p.m. EXAM: CT HEAD WITHOUT CONTRAST TECHNIQUE: Contiguous axial images were obtained from the base of the skull through the vertex without intravenous contrast. COMPARISON:  CT head without contrast 12/14/2019 FINDINGS: Brain: Remote right frontal infarct is stable with associated white matter disease. Diffuse white matter disease is stable. Basal ganglia are stable. Remote lacunar infarcts of the thalami are unchanged. Insular ribbon is normal bilaterally. Remote lacunar infarct of the left cerebellum is stable. Vascular: Atherosclerotic calcifications are present within the cavernous internal carotid arteries bilaterally. No hyperdense vessel is present. Skull: Calvarium is intact. No focal lytic or blastic lesions are present. No significant extracranial soft tissue lesion is present. Sinuses/Orbits: The paranasal sinuses and mastoid air cells are clear. Bilateral lens replacements are noted. Globes and orbits are otherwise  unremarkable. ASPECTS [facility] Stroke Program Early CT Score) - Ganglionic level infarction (caudate, lentiform nuclei, internal capsule, insula, M1-M3 cortex): 7/7 - Supraganglionic infarction (M4-M6 cortex): 3/3 Total score (0-10 with 10 being normal): 10/10 IMPRESSION: 1. Stable remote infarcts of the right frontal lobe, bilateral thalami, and left cerebellum. 2. No acute intracranial abnormality. 3. Stable atrophy and white matter disease. 4. ASPECTS is 10/10 These results were called by telephone at the time of interpretation on 04/07/2020 at 4:16 pm to provider [facility] , who verbally acknowledged these results. Electronically Signed   By: San Morelle M.D.   On: 04/07/2020 16:16     CBC Recent Labs  Lab 04/26/20 0236 04/26/20 0853 04/26/20 1455 04/26/20 2049 04/27/20 0528  WBC 9.9 9.5  --   --  9.2  HGB 7.2* 8.8* 8.7* 8.0* 8.5*  HCT 23.4* 28.1* 27.1* 25.4* 26.9*  PLT 231 225  --   --  206  MCV 91.8 90.6  --   --  90.3  MCH 28.2 28.4  --   --  28.5  MCHC 30.8 31.3  --   --  31.6  RDW 18.3* 17.4*  --   --  18.4*  LYMPHSABS 1.7  --   --   --   --   MONOABS 0.7  --   --   --   --   EOSABS 0.1  --   --   --   --   BASOSABS 0.0  --   --   --   --     Chemistries  Recent Labs  Lab 04/26/20 0236 04/26/20 0853 04/27/20 0528  NA 139 139 140  K 4.0 3.8 4.2  CL 109 109 108  CO2 22 22 25   GLUCOSE 167* 110* 111*  BUN 28* 23 16  CREATININE 0.98 0.81 0.82  CALCIUM 8.3* 8.3* 8.2*  AST 21 25 24   ALT 16 14 15   ALKPHOS 57 57 60  BILITOT 0.7 2.7* 1.6*   ------------------------------------------------------------------------------------------------------------------ No results for input(s): CHOL, HDL, LDLCALC, TRIG, CHOLHDL, LDLDIRECT in the last 72 hours.  Lab Results  Component Value Date   HGBA1C 7.0 (H) 04/07/2020   ------------------------------------------------------------------------------------------------------------------ No results for input(s): TSH, T4TOTAL, T3FREE, THYROIDAB in the last 72 hours.  Invalid input(s): FREET3 ------------------------------------------------------------------------------------------------------------------ No results for input(s): VITAMINB12, FOLATE, FERRITIN, TIBC, IRON, RETICCTPCT in the last 72 hours.  Coagulation profile Recent Labs  Lab 04/26/20 0236  INR 1.0    No results for input(s): DDIMER in the last 72 hours.  Cardiac Enzymes No results for input(s): CKMB, TROPONINI, MYOGLOBIN in the last 168 hours.  Invalid input(s):  CK ------------------------------------------------------------------------------------------------------------------ No results found for: BNP   Roxan Hockey M.D on 04/27/2020 at 3:56 PM  Go to [email or web address] - for contact info  [facility] - Office  [phone]

## 2020-04-27 NOTE — ED Notes (Signed)
Report to [facility] with [facility], ETA 4min

## 2020-04-27 NOTE — ED Notes (Addendum)
Pt requesting all 4 children Ginger Carne, Hadley Pen, & Haig Prophet) be able to call and get information about patient.

## 2020-04-27 NOTE — Progress Notes (Addendum)
Agree with the below findings as written. The patient was presented to me by the APP (Advanced Practice Provider) and I have also seen and examined the patient independently. Please see my key portion of the encounter as documented.  In short, patient is a 86 year old female with complicated past medical history, who presented to [facility] ER with 1 week history of melena found to have acute blood loss anemia with Hgb of 7.2 (now improved with PRBCs). Agree given patient's complicated past medical history, she is high risk for complications from EGD in our facility and needs higher level of care. Currently awaiting transfer at this time.    Subjective: No BM since presenting to the hospital. Continue to pass gas. No abdominal pain, nausea, or vomiting. Tolerated clear liquid diet last night. She has been NPO since midnight. She knows we are waiting on bed availability at [facility].   Objective: Vital signs in last 24 hours: Temp:  [97.9 F (36.6 C)-98.6 F (37 C)] 98.6 F (37 C) (08/09 2219) Pulse Rate:  [59-94] 88 (08/10 0816) Resp:  [12-25] 21 (08/10 0816) BP: (100-142)/(40-96) 133/58 (08/10 0801) SpO2:  [97 %-100 %] 100 % (08/10 0816)   General:   Alert and oriented, pleasant Head:  Normocephalic and atraumatic. Eyes:  No icterus, sclera clear. Conjuctiva pink.  Abdomen:  Bowel sounds present. Abdomen is protuberant but soft and non-tender. No HSM or hernias noted. No rebound or guarding. No masses appreciated  Msk:  Symmetrical without gross deformities.  Extremities:  Without edema. Neurologic:  Alert and  oriented x4;  grossly normal neurologically. Psych: Normal mood and affect.  Intake/Output from previous day: 08/09 0701 - 08/10 0700 In: 348.5 [I.V.:33.5; Blood:315] Out: -  Intake/Output this shift: No intake/output data recorded.  Lab Results: Recent Labs    04/26/20 0236 04/26/20 0236 04/26/20 0853 04/26/20 0853 04/26/20 1455 04/26/20 2049 04/27/20 0528  WBC  9.9  --  9.5  --   --   --  9.2  HGB 7.2*   < > 8.8*   < > 8.7* 8.0* 8.5*  HCT 23.4*   < > 28.1*   < > 27.1* 25.4* 26.9*  PLT 231  --  225  --   --   --  206   < > = values in this interval not displayed.   BMET Recent Labs    04/26/20 0236 04/26/20 0853 04/27/20 0528  NA 139 139 140  K 4.0 3.8 4.2  CL 109 109 108  CO2 22 22 25   GLUCOSE 167* 110* 111*  BUN 28* 23 16  CREATININE 0.98 0.81 0.82  CALCIUM 8.3* 8.3* 8.2*   LFT Recent Labs    04/26/20 0236 04/26/20 0853 04/27/20 0528  PROT 6.0* 5.8* 5.6*  ALBUMIN 3.3* 3.2* 3.1*  AST 21 25 24   ALT 16 14 15   ALKPHOS 57 57 60  BILITOT 0.7 2.7* 1.6*   PT/INR Recent Labs    04/26/20 0236  LABPROT 13.2  INR 1.0    Studies/Results: ECHOCARDIOGRAM COMPLETE  Result Date: 04/27/2020    ECHOCARDIOGRAM REPORT   Patient Name:   ZANOVIA ROTZ Date of Exam: 04/26/2020 Medical Rec #:  [id number]          Height:       65.0 in Accession #:    [id number]         Weight:       167.0 lb Date of Birth:  Jan 29, 1923  BSA:          1.832 m Patient Age:    89 years           BP:           119/58 mmHg Patient Gender: F                  HR:           70 bpm. Exam Location:  [facility] Procedure: 2D Echo Indications:    Abnormal ECG 794.31 / R94.31  History:        Patient has prior history of Echocardiogram examinations, most                 recent 10/17/2019. TIA and Stroke; Risk Factors:Diabetes,                 Dyslipidemia, Hypertension and Non-Smoker.  Sonographer:    Leavy Cella RDCS (AE) Referring Phys: [id number] COURAGE EMOKPAE IMPRESSIONS  1. Left ventricular ejection fraction, by estimation, is 60 to 65%. The left ventricle has normal function. The left ventricle has no regional wall motion abnormalities. There is mild left ventricular hypertrophy. Left ventricular diastolic parameters are consistent with Grade II diastolic dysfunction (pseudonormalization). Elevated left atrial pressure.  2. Right ventricular systolic function is  normal. The right ventricular size is normal. There is moderately elevated pulmonary artery systolic pressure.  3. The mitral valve is abnormal. Mild mitral valve regurgitation.  4. AV is thickened, calcified with restricted motion. Peak and mean gradients through the vlave are 49 and 28 mm respectively consistent with moderate AS. Dimensionless index is 0.37. Compareed to echo from Jan , gradients are decreased.. The aortic valve is abnormal. Aortic valve regurgitation is not visualized. Mild aortic valve sclerosis is present, with no evidence of aortic valve stenosis.  5. The inferior vena cava is normal in size with greater than 50% respiratory variability, suggesting right atrial pressure of 3 mmHg. FINDINGS  Left Ventricle: Left ventricular ejection fraction, by estimation, is 60 to 65%. The left ventricle has normal function. The left ventricle has no regional wall motion abnormalities. The left ventricular internal cavity size was small. There is mild left ventricular hypertrophy. Left ventricular diastolic parameters are consistent with Grade II diastolic dysfunction (pseudonormalization). Elevated left atrial pressure. Right Ventricle: The right ventricular size is normal. Right vetricular wall thickness was not assessed. Right ventricular systolic function is normal. There is moderately elevated pulmonary artery systolic pressure. The tricuspid regurgitant velocity is  3.27 m/s, and with an assumed right atrial pressure of 10 mmHg, the estimated right ventricular systolic pressure is 95.0 mmHg. Left Atrium: Left atrial size was normal in size. Right Atrium: Right atrial size was normal in size. Pericardium: There is no evidence of pericardial effusion. Mitral Valve: The mitral valve is abnormal. There is mild thickening of the mitral valve leaflet(s). Moderate mitral annular calcification. Mild mitral valve regurgitation. MV peak gradient, 18.5 mmHg. The mean mitral valve gradient is 6.0 mmHg. Tricuspid  Valve: The tricuspid valve is normal in structure. Tricuspid valve regurgitation is mild. Aortic Valve: AV is thickened, calcified with restricted motion. Peak and mean gradients through the vlave are 49 and 28 mm respectively consistent with moderate AS. Dimensionless index is 0.37. Compareed to echo from Jan , gradients are decreased. The aortic valve is abnormal. Aortic valve regurgitation is not visualized. Mild aortic valve sclerosis is present, with no evidence of aortic valve stenosis. Aortic valve mean gradient measures 28.3 mmHg. Aortic  valve peak gradient measures 48.7 mmHg. Pulmonic Valve: The pulmonic valve was not well visualized. Pulmonic valve regurgitation is not visualized. No evidence of pulmonic stenosis. Aorta: The aortic root is normal in size and structure. Venous: The inferior vena cava is normal in size with greater than 50% respiratory variability, suggesting right atrial pressure of 3 mmHg. IAS/Shunts: The interatrial septum was not assessed.  LEFT VENTRICLE PLAX 2D LVIDd:         3.33 cm Diastology LVIDs:         2.22 cm LV e' lateral:   5.70 cm/s LV PW:         1.35 cm LV E/e' lateral: 35.1 LV IVS:        1.15 cm LV e' medial:    4.88 cm/s                        LV E/e' medial:  41.0  RIGHT VENTRICLE RV S prime:     16.20 cm/s TAPSE (M-mode): 2.4 cm LEFT ATRIUM             Index       RIGHT ATRIUM           Index LA diam:        3.90 cm 2.13 cm/m  RA Area:     13.20 cm LA Vol (A2C):   31.9 ml 17.41 ml/m RA Volume:   32.40 ml  17.68 ml/m LA Vol (A4C):   38.8 ml 21.18 ml/m LA Biplane Vol: 36.2 ml 19.76 ml/m  AORTIC VALVE AV Vmax:           349.00 cm/s AV Vmean:          251.667 cm/s AV VTI:            0.812 m AV Peak Grad:      48.7 mmHg AV Mean Grad:      28.3 mmHg LVOT Vmax:         133.33 cm/s LVOT Vmean:        93.867 cm/s LVOT VTI:          0.299 m LVOT/AV VTI ratio: 0.37  AORTA Ao Root diam: 3.40 cm MITRAL VALVE                TRICUSPID VALVE MV Area (PHT): 4.15 cm     TR Peak  grad:   42.8 mmHg MV Peak grad:  18.5 mmHg    TR Vmax:        327.00 cm/s MV Mean grad:  6.0 mmHg MV Vmax:       2.15 m/s     SHUNTS MV Vmean:      113.0 cm/s   Systemic VTI: 0.30 m MV Decel Time: 183 msec MR Peak grad: 164.9 mmHg MR Mean grad: 88.0 mmHg MR Vmax:      642.00 cm/s MR Vmean:     421.0 cm/s MV E velocity: 200.00 cm/s MV A velocity: 196.00 cm/s MV E/A ratio:  1.02 Dorris Carnes MD Electronically signed by Dorris Carnes MD Signature Date/Time: 04/27/2020/12:16:06 AM    Final     Assessment: 86 y.o. female admitted on 04/26/20 with week-long history of melena, progressive fatigue, found to have acute blood loss anemia and heme positive stool. Received 1 unit PRBCs with improvement in Hgb to 8.8 from 7.2; appears her prior Hgb was in the 11 range several weeks ago. She endorsed ibuprofen sparingly up until February 2021. Last EGD  in 2013 during evaluation for IDA with chronic gastritis; small bowel also examined at that time with small bowel lesion noted but felt to be benign by Dr. Arsenio Loader at [facility]. Concern for gastritis, PUD, AVMs, less likely malignancy at this point or right-sided colon lesion.   Due to complicated medical history including stroke in Jan 2021 s/p carotid endarterectomy, recent admission July 2021 with acute CVA (MRI brain did not show any obvious infarct and felt to have had small infarct not appreciated on MRI), and significantly elevated troponin's (managing medically per cardiology), patient is not a candidate for procedures here at [facility]. She is pending transfer to to [facility], no beds available at this time.   Plavix and aspirin are on hold for now. She is on IV Protonix 40 mg BID. Hemoglobin has remained stable at 8.5 this morning. No overt GI bleeding since presenting to the hospital. No abdominal pain, nausea, or vomiting. Abdominal exam is benign today. Patient tolerated a clear liquid diet last night but has been NPO since midnight in the instance she is able to  be transferred and have EGD.   Plan: Remain NPO for now.  Continue IV PPI BID Monitor for overt GI bleeding and transfuse as necessary.  Transfer to [facility] when able.  We will follow peripherally.    LOS: 1 day    04/27/2020, 9:44 AM   Aliene Altes, PA-C [facility]

## 2020-04-27 NOTE — Progress Notes (Signed)
Subjective:  Denies SSCP, palpitations or Dyspnea Feels better   Objective:  Vitals:   04/27/20 0731 04/27/20 0746 04/27/20 0801 04/27/20 0816  BP: (!) 107/53  (!) 133/58   Pulse: 74 61 62 88  Resp: 16 18 18  (!) 21  Temp:      TempSrc:      SpO2: 100% 100% 100% 100%  Weight:      Height:        Intake/Output from previous day: No intake or output data in the 24 hours ending 04/27/20 0901  Physical Exam: Affect appropriate Pale elderly female  HEENT: normal Neck supple with no adenopathy JVP normal no bruits no thyromegaly Lungs clear with no wheezing and good diaphragmatic motion Heart:  S1/S2 AS  murmur, no rub, gallop or click PMI normal Abdomen: benighn, BS positve, no tenderness, no AAA no bruit.  No HSM or HJR Distal pulses intact with no bruits No edema Neuro non-focal Skin warm and dry No muscular weakness   Lab Results: Basic Metabolic Panel: Recent Labs    04/26/20 0853 04/27/20 0528  NA 139 140  K 3.8 4.2  CL 109 108  CO2 22 25  GLUCOSE 110* 111*  BUN 23 16  CREATININE 0.81 0.82  CALCIUM 8.3* 8.2*   Liver Function Tests: Recent Labs    04/26/20 0853 04/27/20 0528  AST 25 24  ALT 14 15  ALKPHOS 57 60  BILITOT 2.7* 1.6*  PROT 5.8* 5.6*  ALBUMIN 3.2* 3.1*   No results for input(s): LIPASE, AMYLASE in the last 72 hours. CBC: Recent Labs    04/26/20 0236 04/26/20 0236 04/26/20 0853 04/26/20 1455 04/26/20 2049 04/27/20 0528  WBC 9.9   < > 9.5  --   --  9.2  NEUTROABS 7.4  --   --   --   --   --   HGB 7.2*   < > 8.8*   < > 8.0* 8.5*  HCT 23.4*   < > 28.1*   < > 25.4* 26.9*  MCV 91.8   < > 90.6  --   --  90.3  PLT 231   < > 225  --   --  206   < > = values in this interval not displayed.    Imaging: ECHOCARDIOGRAM COMPLETE  Result Date: 04/27/2020    ECHOCARDIOGRAM REPORT   Patient Name:   Denise Parrish Date of Exam: 04/26/2020 Medical Rec #:  [id number]          Height:       65.0 in Accession #:    [id number]          Weight:       167.0 lb Date of Birth:  1922/12/16           BSA:          1.832 m Patient Age:    86 years           BP:           119/58 mmHg Patient Gender: F                  HR:           70 bpm. Exam Location:  [facility] Procedure: 2D Echo Indications:    Abnormal ECG 794.31 / R94.31  History:        Patient has prior history of Echocardiogram examinations, most  recent 10/17/2019. TIA and Stroke; Risk Factors:Diabetes,                 Dyslipidemia, Hypertension and Non-Smoker.  Sonographer:    Leavy Cella RDCS (AE) Referring Phys: [id number] COURAGE EMOKPAE IMPRESSIONS  1. Left ventricular ejection fraction, by estimation, is 60 to 65%. The left ventricle has normal function. The left ventricle has no regional wall motion abnormalities. There is mild left ventricular hypertrophy. Left ventricular diastolic parameters are consistent with Grade II diastolic dysfunction (pseudonormalization). Elevated left atrial pressure.  2. Right ventricular systolic function is normal. The right ventricular size is normal. There is moderately elevated pulmonary artery systolic pressure.  3. The mitral valve is abnormal. Mild mitral valve regurgitation.  4. AV is thickened, calcified with restricted motion. Peak and mean gradients through the vlave are 49 and 28 mm respectively consistent with moderate AS. Dimensionless index is 0.37. Compareed to echo from Jan , gradients are decreased.. The aortic valve is abnormal. Aortic valve regurgitation is not visualized. Mild aortic valve sclerosis is present, with no evidence of aortic valve stenosis.  5. The inferior vena cava is normal in size with greater than 50% respiratory variability, suggesting right atrial pressure of 3 mmHg. FINDINGS  Left Ventricle: Left ventricular ejection fraction, by estimation, is 60 to 65%. The left ventricle has normal function. The left ventricle has no regional wall motion abnormalities. The left ventricular internal cavity size was  small. There is mild left ventricular hypertrophy. Left ventricular diastolic parameters are consistent with Grade II diastolic dysfunction (pseudonormalization). Elevated left atrial pressure. Right Ventricle: The right ventricular size is normal. Right vetricular wall thickness was not assessed. Right ventricular systolic function is normal. There is moderately elevated pulmonary artery systolic pressure. The tricuspid regurgitant velocity is  3.27 m/s, and with an assumed right atrial pressure of 10 mmHg, the estimated right ventricular systolic pressure is 33.2 mmHg. Left Atrium: Left atrial size was normal in size. Right Atrium: Right atrial size was normal in size. Pericardium: There is no evidence of pericardial effusion. Mitral Valve: The mitral valve is abnormal. There is mild thickening of the mitral valve leaflet(s). Moderate mitral annular calcification. Mild mitral valve regurgitation. MV peak gradient, 18.5 mmHg. The mean mitral valve gradient is 6.0 mmHg. Tricuspid Valve: The tricuspid valve is normal in structure. Tricuspid valve regurgitation is mild. Aortic Valve: AV is thickened, calcified with restricted motion. Peak and mean gradients through the vlave are 49 and 28 mm respectively consistent with moderate AS. Dimensionless index is 0.37. Compareed to echo from Jan , gradients are decreased. The aortic valve is abnormal. Aortic valve regurgitation is not visualized. Mild aortic valve sclerosis is present, with no evidence of aortic valve stenosis. Aortic valve mean gradient measures 28.3 mmHg. Aortic valve peak gradient measures 48.7 mmHg. Pulmonic Valve: The pulmonic valve was not well visualized. Pulmonic valve regurgitation is not visualized. No evidence of pulmonic stenosis. Aorta: The aortic root is normal in size and structure. Venous: The inferior vena cava is normal in size with greater than 50% respiratory variability, suggesting right atrial pressure of 3 mmHg. IAS/Shunts: The  interatrial septum was not assessed.  LEFT VENTRICLE PLAX 2D LVIDd:         3.33 cm Diastology LVIDs:         2.22 cm LV e' lateral:   5.70 cm/s LV PW:         1.35 cm LV E/e' lateral: 35.1 LV IVS:  1.15 cm LV e' medial:    4.88 cm/s                        LV E/e' medial:  41.0  RIGHT VENTRICLE RV S prime:     16.20 cm/s TAPSE (M-mode): 2.4 cm LEFT ATRIUM             Index       RIGHT ATRIUM           Index LA diam:        3.90 cm 2.13 cm/m  RA Area:     13.20 cm LA Vol (A2C):   31.9 ml 17.41 ml/m RA Volume:   32.40 ml  17.68 ml/m LA Vol (A4C):   38.8 ml 21.18 ml/m LA Biplane Vol: 36.2 ml 19.76 ml/m  AORTIC VALVE AV Vmax:           349.00 cm/s AV Vmean:          251.667 cm/s AV VTI:            0.812 m AV Peak Grad:      48.7 mmHg AV Mean Grad:      28.3 mmHg LVOT Vmax:         133.33 cm/s LVOT Vmean:        93.867 cm/s LVOT VTI:          0.299 m LVOT/AV VTI ratio: 0.37  AORTA Ao Root diam: 3.40 cm MITRAL VALVE                TRICUSPID VALVE MV Area (PHT): 4.15 cm     TR Peak grad:   42.8 mmHg MV Peak grad:  18.5 mmHg    TR Vmax:        327.00 cm/s MV Mean grad:  6.0 mmHg MV Vmax:       2.15 m/s     SHUNTS MV Vmean:      113.0 cm/s   Systemic VTI: 0.30 m MV Decel Time: 183 msec MR Peak grad: 164.9 mmHg MR Mean grad: 88.0 mmHg MR Vmax:      642.00 cm/s MR Vmean:     421.0 cm/s MV E velocity: 200.00 cm/s MV A velocity: 196.00 cm/s MV E/A ratio:  1.02 Dorris Carnes MD Electronically signed by Dorris Carnes MD Signature Date/Time: 04/27/2020/12:16:06 AM    Final     Cardiac Studies:  ECG: SR diffuse ST changes    Telemetry:  NSR PAC;s   Echo: EF 60-65% moderate AS mean gradient 28 mmHg   Medications:   . insulin aspart  0-6 Units Subcutaneous TID WC  . metoprolol tartrate  5 mg Intravenous Q8H  . pantoprazole (PROTONIX) IV  40 mg Intravenous Q12H      Assessment/Plan:   1. Elevated Troponin:  No chest pain echo with no RWMA not a candidate for cath given GI bleeding continue beta blocker 2. AS:   Moderate by echo given age likely not even a candidate for TAVR 3. HLD:  Statin held due to elevated LFTls  4. GI bleed:  Hb improved this am 8.5 plan per primary service  Jenkins Rouge 04/27/2020, 9:01 AM

## 2020-04-27 NOTE — Consult Note (Addendum)
Consultation Note Date: 04/27/2020   Patient Name: Denise Parrish  DOB: 1922-10-03  MRN: [id number]  Age / Sex: 86 y.o., female  PCP: Denise Andrea, MD Referring Physician: Roxan Hockey, MD  Reason for Consultation: Establishing goals of care  HPI/Patient Profile: 86 y.o. female  with past medical history of mod-severe aortic stenosis, chest palpitations, cholelithiasis, DJD, diabetes, emphysema, GERD, hiatal hernia, HTN, HLD, iron deficiency anemia, mild cognitive impairment, sigmoid diverticulosis, stroke Jan 2021, s/p carotid endarterectomy, pneumonia, osteoporosis, anxiety admitted from home on 04/26/2020 with weakness, fatigue, lightheaded, pallor, and melena. EGD recommended by GI but increase troponins and need cardiology recommends medical management. Awaiting transfer to [facility] for further GI, cardiology, and possible IR intervention for GIB and elevated troponins. GIB prevents further work up with catheterization.   Clinical Assessment and Goals of Care: I met today with Denise Parrish along with her daughter, [name], at bedside. I discussed with Dr. Denton Parrish and RN prior to visit. Denise Parrish is lying in bed and in good spirits. She tells me that she is "feeling good." just ready to get into an actual room and out of ED. Awaiting bed at [facility] to pursue work up of GIB and increased troponin levels. We discussed plans for medical treatment of cardiac concerns and close monitoring. Reviewed echocardiogram with them. Discussed plan for EGD and follow up for GIB. They are clear that they wish to pursue aggressive care. We also discussed the delicate balance of cardiac concern, GIB, and stroke prevention with medication vs risk of bleeding.   We further discussed goals of care and wishes and they were very clear that they desire full code. Denise Parrish endorses very good quality of life and lives independently  (but her 4 children are nearby). Denise Parrish tells me that her brother had many co-morbidities and was on a breathing machine and recovered to have many more good years so she would want to try this for herself. We did discuss that if she were to have severe cardiac issues or a large stroke with hemiparesis or trouble swallowing that this would change and they tell me that it would. For now they are hopeful for improvement and return back home and open to aggressive measures.   All questions/concerns addressed. Emotional support provided.   Primary Decision Maker PATIENT    SUMMARY OF RECOMMENDATIONS   - Hopeful for improvement - Desires full code, full aggressive care  Code Status/Advance Care Planning:  Full code   Symptom Management:   Per attending, GI, cardiology.   Prognosis:   Unable to determine  Discharge Planning: To Be Determined      Primary Diagnoses: Present on Admission: . Acute GI bleeding . Essential hypertension . Hyperlipidemia . Anxiety   I have reviewed the medical record, interviewed the patient and family, and examined the patient. The following aspects are pertinent.  Past Medical History:  Diagnosis Date  . Anxiety   . Aortic sclerosis    Long-standing heart murmur  . Carotid stenosis, right 10/16/2019  75% on CTA neck  . Chest pain 2006   +palpitations; minimal CAD at cath in 2005; normal EF; negative stress echo in 09/2008  . Cholelithiasis    on u/s 09/2011  . Degenerative joint disease    Right THA in 1995; cervical discectomy and fusion-2001  . Diabetes ([place])   . Diabetes mellitus, type 2 (HCC)    + neuropathy  . Emphysema   . Gastroesophageal reflux disease   . Hiatal hernia   . Hyperlipidemia   . Hypertension   . IDA (iron deficiency anemia)    labs 09/2011  . Mild cognitive impairment   . Obesity   . Osteoporosis   . Pneumonia   . Sigmoid diverticulosis   . Small bowel lesion    On Given's capsule; Dr Kathleene Hazel  07/2012, no further w/u needed  . Stroke [facility])    Social History   Socioeconomic History  . Marital status: Divorced    Spouse name: Not on file  . Number of children: 4  . Years of education: Not on file  . Highest education level: Not on file  Occupational History  . Occupation: Retired    Comment: [employer]  Tobacco Use  . Smoking status: Never Smoker  . Smokeless tobacco: Never Used  . Tobacco comment: Quit x 50 years  Vaping Use  . Vaping Use: Never used  Substance and Sexual Activity  . Alcohol use: Never  . Drug use: Never  . Sexual activity: Not Currently  Other Topics Concern  . Not on file  Social History Narrative   ** Merged History Encounter **       Social Determinants of Health   Financial Resource Strain:   . Difficulty of Paying Living Expenses:   Food Insecurity:   . Worried About Charity fundraiser in the Last Year:   . Arboriculturist in the Last Year:   Transportation Needs:   . Film/video editor (Medical):   [name] Lack of Transportation (Non-Medical):   Physical Activity:   . Days of Exercise per Week:   . Minutes of Exercise per Session:   Stress:   . Feeling of Stress :   Social Connections:   . Frequency of Communication with Friends and Family:   . Frequency of Social Gatherings with Friends and Family:   . Attends Religious Services:   . Active Member of Clubs or Organizations:   . Attends Archivist Meetings:   [name] Marital Status:    Family History  Problem Relation Age of Onset  . Cirrhosis Mother        no etoh use  . CAD Father   . Diabetes Mother        + brother  . Heart failure Father   . Hypertension Father        + brother  . Cancer Brother        lung, age 2  . Uterine cancer Sister   . Colon cancer Neg Hx    Scheduled Meds: . insulin aspart  0-6 Units Subcutaneous TID WC  . metoprolol tartrate  5 mg Intravenous Q8H  . pantoprazole (PROTONIX) IV  40 mg Intravenous Q12H   Continuous Infusions: PRN  Meds:.acetaminophen **OR** acetaminophen, LORazepam, ondansetron **OR** ondansetron (ZOFRAN) IV No Known Allergies Review of Systems  Constitutional: Negative for activity change and appetite change.  Respiratory: Negative for shortness of breath.   Cardiovascular: Negative for chest pain.  Gastrointestinal: Positive for abdominal distention.  Negative for diarrhea, nausea and vomiting.    Physical Exam Vitals and nursing note reviewed.  Constitutional:      General: She is not in acute distress.    Appearance: Normal appearance.  Cardiovascular:     Rate and Rhythm: Normal rate and regular rhythm.  Pulmonary:     Effort: Pulmonary effort is normal. No tachypnea, accessory muscle usage or respiratory distress.  Abdominal:     General: There is distension.     Palpations: Abdomen is soft.  Neurological:     Mental Status: She is alert and oriented to person, place, and time.     Vital Signs: BP 128/64   Pulse 80   Temp 98.6 F (37 C) (Oral)   Resp 20   Ht _0  (1.651 m)   Wt 75.8 kg   SpO2 100%   BMI 27.79 kg/m  Pain Scale: 0-10   Pain Score: 0-No pain   SpO2: SpO2: 100 % O2 Device:SpO2: 100 % O2 Flow Rate: .O2 Flow Rate (L/min): 2 L/min  IO: Intake/output summary: No intake or output data in the 24 hours ending 04/27/20 1233  LBM:   Baseline Weight: Weight: 75.8 kg Most recent weight: Weight: 75.8 kg     Palliative Assessment/Data:     Time In: 1245 Time Out: 1355 Time Total: 70 MIN Greater than 50%  of this time was spent counseling and coordinating care related to the above assessment and plan.  Signed by: Vinie Sill, NP Palliative Medicine Team Pager # [phone] (M-F 8a-5p) Team Phone # [phone] (Nights/Weekends)

## 2020-04-27 NOTE — ED Notes (Signed)
ED TO INPATIENT HANDOFF REPORT  ED Nurse Name and Phone #:  [phone]  S Name/Age/Gender Denise Parrish 86 y.o. female Room/Bed: APA11/APA11  Code Status   Code Status: Full Code  Home/SNF/Other Home Patient oriented to: self, place, time and situation Is this baseline? Yes   Triage Complete: Triage complete  Chief Complaint Acute GI bleeding [K92.2]  Triage Note Per EMS, pt daughter states pt has had loose black stool for abour 5 days but has not wanted to go to pcp. Pt reports 2-3 bm yesterday. Pt reports hx of gi bleeds "but not like this." Daughter states pt is paler than normal.     Allergies No Known Allergies  Level of Care/Admitting Diagnosis ED Disposition    ED Disposition Condition [facility] Area: [place] [[id number]]  Level of Care: Telemetry Medical [104]  Covid Evaluation: Asymptomatic Screening Protocol (No Symptoms)  Diagnosis: Acute GI bleeding [656812]  Admitting Physician: Morrison Old  Attending Physician: Morrison Old  Estimated length of stay: 3 - 4 days  Certification:: I certify this patient will need inpatient services for at least 2 midnights  Bed request comments: Tele       B Medical/Surgery History Past Medical History:  Diagnosis Date  . Anxiety   . Aortic sclerosis    Long-standing heart murmur  . Carotid stenosis, right 10/16/2019   75% on CTA neck  . Chest pain 2006   +palpitations; minimal CAD at cath in 2005; normal EF; negative stress echo in 09/2008  . Cholelithiasis    on u/s 09/2011  . Degenerative joint disease    Right THA in 1995; cervical discectomy and fusion-2001  . Diabetes ([place])   . Diabetes mellitus, type 2 (HCC)    + neuropathy  . Emphysema   . Gastroesophageal reflux disease   . Hiatal hernia   . Hyperlipidemia   . Hypertension   . IDA (iron deficiency anemia)    labs 09/2011  . Mild cognitive impairment   . Obesity   . Osteoporosis   .  Pneumonia   . Sigmoid diverticulosis   . Small bowel lesion    On Given's capsule; Dr Kathleene Hazel 07/2012, no further w/u needed  . Stroke [facility])    Past Surgical History:  Procedure Laterality Date  . ANTERIOR CERVICAL DISCECTOMY  2001   C4-5, allograft, fixation  . BREAST LUMPECTOMY     benign  . CATARACT EXTRACTION W/ INTRAOCULAR LENS IMPLANT  2007   Left  . COLONOSCOPY  2006  . COLONOSCOPY  04/03/12   Dr. Ok Edwards diverticulosis, negative microscopic  colitis   . ENDARTERECTOMY Right 10/21/2019   Procedure: ENDARTERECTOMY CAROTID RIGHT;  Surgeon: Waynetta Sandy, MD;  Location: [facility];  Service: Vascular;  Laterality: Right;  . ESOPHAGOGASTRODUODENOSCOPY  04/03/12   Dr. Jennet Maduro hernia, chronic gastritis on bx  . GIVENS CAPSULE STUDY  05/09/2012   RMR: an unclear raised area of small bowel was noted, with features almost  characteristic of very small polyp. This was without villous  blunting or any evidence of active bleeding; yet, the area of  concern appeared to be erythematous. However, this could simply  be a light reflection on a normal variation of the small bowel. REFERRED TO DR. GILLIAM, APPT FOR NOVEMBER 2013.   [name] HIP ARTHROPLASTY Right   . PATCH ANGIOPLASTY Right 10/21/2019   Procedure: PATCH ANGIOPLASTY USING Rueben Bash BIOLOGIC PATCH;  Surgeon: Waynetta Sandy, MD;  Location:  [facility] OR;  Service: Vascular;  Laterality: Right;  . TONSILLECTOMY    . TOTAL HIP ARTHROPLASTY  1995   Right     A IV Location/Drains/Wounds Patient Lines/Drains/Airways Status    Active Line/Drains/Airways    Name Placement date Placement time Site Days   Peripheral IV 04/26/20 Left Antecubital 04/26/20  0523  Antecubital  1   External Urinary Catheter 04/26/20  0406  --  1   Incision (Closed) 10/21/19 Neck Right 10/21/19  1108   189          Intake/Output Last 24 hours No intake or output data in the 24 hours ending 04/27/20 1655  Labs/Imaging Results for  orders placed or performed during the hospital encounter of 04/26/20 (from the past 48 hour(s))  SARS Coronavirus 2 by RT PCR (hospital order, performed in [place] hospital lab) Nasopharyngeal Nasopharyngeal Swab     Status: None   Collection Time: 04/26/20  1:54 AM   Specimen: Nasopharyngeal Swab  Result Value Ref Range   SARS Coronavirus 2 NEGATIVE NEGATIVE    Comment: (NOTE) SARS-CoV-2 target nucleic acids are NOT DETECTED.  The SARS-CoV-2 RNA is generally detectable in upper and lower respiratory specimens during the acute phase of infection. The lowest concentration of SARS-CoV-2 viral copies this assay can detect is 250 copies / mL. A negative result does not preclude SARS-CoV-2 infection and should not be used as the sole basis for treatment or other patient management decisions.  A negative result may occur with improper specimen collection / handling, submission of specimen other than nasopharyngeal swab, presence of viral mutation(s) within the areas targeted by this assay, and inadequate number of viral copies (<250 copies / mL). A negative result must be combined with clinical observations, patient history, and epidemiological information.  Fact Sheet for Patients:   StrictlyIdeas.no  Fact Sheet for Healthcare Providers: BankingDealers.co.za  This test is not yet approved or  cleared by the [place] FDA and has been authorized for detection and/or diagnosis of SARS-CoV-2 by FDA under an Emergency Use Authorization (EUA).  This EUA will remain in effect (meaning this test can be used) for the duration of the COVID-19 declaration under Section 564(b)(1) of the Act, 21 U.S.C. section 360bbb-3(b)(1), unless the authorization is terminated or revoked sooner.  Performed at [facility], [address] [place], [place] [address]   CBC with Differential/Platelet     Status: Abnormal   Collection Time: 04/26/20  2:36 AM   Result Value Ref Range   WBC 9.9 4.0 - 10.5 K/uL   RBC 2.55 (L) 3.87 - 5.11 MIL/uL   Hemoglobin 7.2 (L) 12.0 - 15.0 g/dL   HCT 23.4 (L) 36 - 46 %   MCV 91.8 80.0 - 100.0 fL   MCH 28.2 26.0 - 34.0 pg   MCHC 30.8 30.0 - 36.0 g/dL   RDW 18.3 (H) 11.5 - 15.5 %   Platelets 231 150 - 400 K/uL   nRBC 0.0 0.0 - 0.2 %   Neutrophils Relative % 75 %   Neutro Abs 7.4 1.7 - 7.7 K/uL   Lymphocytes Relative 17 %   Lymphs Abs 1.7 0.7 - 4.0 K/uL   Monocytes Relative 7 %   Monocytes Absolute 0.7 0 - 1 K/uL   Eosinophils Relative 1 %   Eosinophils Absolute 0.1 0 - 0 K/uL   Basophils Relative 0 %   Basophils Absolute 0.0 0 - 0 K/uL   Immature Granulocytes 0 %   Abs Immature Granulocytes  0.04 0.00 - 0.07 K/uL    Comment: Performed at [facility], [address], [place], [place] [address]  Comprehensive metabolic panel     Status: Abnormal   Collection Time: 04/26/20  2:36 AM  Result Value Ref Range   Sodium 139 135 - 145 mmol/L   Potassium 4.0 3.5 - 5.1 mmol/L   Chloride 109 98 - 111 mmol/L   CO2 22 22 - 32 mmol/L   Glucose, Bld 167 (H) 70 - 99 mg/dL    Comment: Glucose reference range applies only to samples taken after fasting for at least 8 hours.   BUN 28 (H) 8 - 23 mg/dL   Creatinine, Ser 0.98 0.44 - 1.00 mg/dL   Calcium 8.3 (L) 8.9 - 10.3 mg/dL   Total Protein 6.0 (L) 6.5 - 8.1 g/dL   Albumin 3.3 (L) 3.5 - 5.0 g/dL   AST 21 15 - 41 U/L   ALT 16 0 - 44 U/L   Alkaline Phosphatase 57 38 - 126 U/L   Total Bilirubin 0.7 0.3 - 1.2 mg/dL   GFR calc non Af Amer 49 (L) >60 mL/min   GFR calc Af Amer 57 (L) >60 mL/min   Anion gap 8 5 - 15    Comment: Performed at [facility], [address], [place], [place] [address]  Protime-INR     Status: None   Collection Time: 04/26/20  2:36 AM  Result Value Ref Range   Prothrombin Time 13.2 11.4 - 15.2 seconds   INR 1.0 0.8 - 1.2    Comment: (NOTE) INR goal varies based on device and disease states. Performed at [facility], [address],  [place], [place] [address]   Troponin I (High Sensitivity)     Status: Abnormal   Collection Time: 04/26/20  2:36 AM  Result Value Ref Range   Troponin I (High Sensitivity) 380 (HH) <18 ng/L    Comment: CRITICAL RESULT CALLED TO, READ BACK BY AND VERIFIED WITH: TURNER,C @ 0333 ON 04/26/20 BY JUW (NOTE) Elevated high sensitivity troponin I (hsTnI) values and significant  changes across serial measurements may suggest ACS but many other  chronic and acute conditions are known to elevate hsTnI results.  Refer to the Links section for chest pain algorithms and additional  guidance. Performed at [facility], [address] [place], [place] [address]   Type and screen     Status: None   Collection Time: 04/26/20  2:37 AM  Result Value Ref Range   ABO/RH(D) AB POS    Antibody Screen NEG    Sample Expiration 04/29/2020,2359    Unit Number [id number]    Blood Component Type RED CELLS,LR    Unit division 00    Status of Unit ISSUED,FINAL    Transfusion Status OK TO TRANSFUSE    Crossmatch Result      Compatible Performed at [facility], [address] [place], [place] [address]   Prepare RBC (crossmatch)     Status: None   Collection Time: 04/26/20  2:37 AM  Result Value Ref Range   Order Confirmation      ORDER PROCESSED BY BLOOD BANK Performed at [facility], [address], [place], [place] [address]   POC occult blood, ED     Status: Abnormal   Collection Time: 04/26/20  3:27 AM  Result Value Ref Range   Fecal Occult Bld POSITIVE (A) NEGATIVE  ABO/Rh     Status: None   Collection Time: 04/26/20  3:46 AM  Result Value Ref Range   ABO/RH(D)      AB POS Performed at [facility], [address], [place], [place] [address]   Troponin I (High Sensitivity)     Status: Abnormal   Collection Time: 04/26/20  3:46 AM  Result Value Ref Range   Troponin I (High Sensitivity) 431 (HH) <18 ng/L    Comment: CRITICAL RESULT CALLED TO, READ BACK BY AND VERIFIED WITH: PRUITT,G AT 4:50AM ON  04/26/20 BY FESTERMAN,C (NOTE) Elevated high sensitivity troponin I (hsTnI) values and significant  changes across serial measurements may suggest ACS but many other  chronic and acute conditions are known to elevate hsTnI results.  Refer to the Links section for chest pain algorithms and additional  guidance. Performed at [facility], [address] [place], [place] [address]   CBG monitoring, ED     Status: Abnormal   Collection Time: 04/26/20  8:08 AM  Result Value Ref Range   Glucose-Capillary 108 (H) 70 - 99 mg/dL    Comment: Glucose reference range applies only to samples taken after fasting for at least 8 hours.  CBC     Status: Abnormal   Collection Time: 04/26/20  8:53 AM  Result Value Ref Range   WBC 9.5 4.0 - 10.5 K/uL   RBC 3.10 (L) 3.87 - 5.11 MIL/uL   Hemoglobin 8.8 (L) 12.0 - 15.0 g/dL   HCT 28.1 (L) 36 - 46 %   MCV 90.6 80.0 - 100.0 fL   MCH 28.4 26.0 - 34.0 pg   MCHC 31.3 30.0 - 36.0 g/dL   RDW 17.4 (H) 11.5 - 15.5 %   Platelets 225 150 - 400 K/uL   nRBC 0.0 0.0 - 0.2 %    Comment: Performed at [facility], [address], [place], [place] [address]  Comprehensive metabolic panel     Status: Abnormal   Collection Time: 04/26/20  8:53 AM  Result Value Ref Range   Sodium 139 135 - 145 mmol/L   Potassium 3.8 3.5 - 5.1 mmol/L   Chloride 109 98 - 111 mmol/L   CO2 22 22 - 32 mmol/L   Glucose, Bld 110 (H) 70 - 99 mg/dL    Comment: Glucose reference range applies only to samples taken after fasting for at least 8 hours.   BUN 23 8 - 23 mg/dL   Creatinine, Ser 0.81 0.44 - 1.00 mg/dL   Calcium 8.3 (L) 8.9 - 10.3 mg/dL   Total Protein 5.8 (L) 6.5 - 8.1 g/dL   Albumin 3.2 (L) 3.5 - 5.0 g/dL   AST 25 15 - 41 U/L   ALT 14 0 - 44 U/L   Alkaline Phosphatase 57 38 - 126 U/L   Total Bilirubin 2.7 (H) 0.3 - 1.2 mg/dL   GFR calc non Af Amer >60 >60 mL/min   GFR calc Af Amer >60 >60 mL/min   Anion gap 8 5 - 15    Comment: Performed at [facility], [address],  [place], [place] [address]  Troponin I (High Sensitivity)     Status: Abnormal   Collection Time: 04/26/20  8:53 AM  Result Value Ref Range   Troponin I (High Sensitivity) 1,314 (HH) <18 ng/L    Comment: CRITICAL RESULT CALLED TO, READ BACK BY AND VERIFIED WITH: Keishaun Hazel,BETH @0937  ON 04/26/20 BY JONES,TAYLOR (NOTE) Elevated high sensitivity troponin I (hsTnI) values and significant  changes across serial measurements may suggest ACS but many other  chronic and acute conditions are known  to elevate hsTnI results.  Refer to the Links section for chest pain algorithms and additional  guidance. Performed at [facility], [address], [place], [place] [address]   Troponin I (High Sensitivity)     Status: Abnormal   Collection Time: 04/26/20 11:43 AM  Result Value Ref Range   Troponin I (High Sensitivity) 1,891 (HH) <18 ng/L    Comment: CRITICAL RESULT CALLED TO, READ BACK BY AND VERIFIED WITH: DEVOLT,T AT 12:20PM ON 04/26/20 BY FESTERMAN,C (NOTE) Elevated high sensitivity troponin I (hsTnI) values and significant  changes across serial measurements may suggest ACS but many other  chronic and acute conditions are known to elevate hsTnI results.  Refer to the Links section for chest pain algorithms and additional  guidance. Performed at [facility], [address] [place], [place] [address]   Hemoglobin and hematocrit, blood     Status: Abnormal   Collection Time: 04/26/20  2:55 PM  Result Value Ref Range   Hemoglobin 8.7 (L) 12.0 - 15.0 g/dL   HCT 27.1 (L) 36 - 46 %    Comment: Performed at [facility], [address], [place], [place] [address]  CBG monitoring, ED     Status: None   Collection Time: 04/26/20  6:02 PM  Result Value Ref Range   Glucose-Capillary 98 70 - 99 mg/dL    Comment: Glucose reference range applies only to samples taken after fasting for at least 8 hours.  Hemoglobin and hematocrit, blood     Status: Abnormal   Collection Time: 04/26/20  8:49 PM  Result Value  Ref Range   Hemoglobin 8.0 (L) 12.0 - 15.0 g/dL   HCT 25.4 (L) 36 - 46 %    Comment: Performed at [facility], [address] [place], [place] [address]  CBG monitoring, ED     Status: Abnormal   Collection Time: 04/26/20 10:24 PM  Result Value Ref Range   Glucose-Capillary 158 (H) 70 - 99 mg/dL    Comment: Glucose reference range applies only to samples taken after fasting for at least 8 hours.  CBC     Status: Abnormal   Collection Time: 04/27/20  5:28 AM  Result Value Ref Range   WBC 9.2 4.0 - 10.5 K/uL   RBC 2.98 (L) 3.87 - 5.11 MIL/uL   Hemoglobin 8.5 (L) 12.0 - 15.0 g/dL   HCT 26.9 (L) 36 - 46 %   MCV 90.3 80.0 - 100.0 fL   MCH 28.5 26.0 - 34.0 pg   MCHC 31.6 30.0 - 36.0 g/dL   RDW 18.4 (H) 11.5 - 15.5 %   Platelets 206 150 - 400 K/uL   nRBC 0.0 0.0 - 0.2 %    Comment: Performed at [facility], [address] [place], [place] [address]  Comprehensive metabolic panel     Status: Abnormal   Collection Time: 04/27/20  5:28 AM  Result Value Ref Range   Sodium 140 135 - 145 mmol/L   Potassium 4.2 3.5 - 5.1 mmol/L   Chloride 108 98 - 111 mmol/L   CO2 25 22 - 32 mmol/L   Glucose, Bld 111 (H) 70 - 99 mg/dL    Comment: Glucose reference range applies only to samples taken after fasting for at least 8 hours.   BUN 16 8 - 23 mg/dL   Creatinine, Ser 0.82 0.44 - 1.00 mg/dL   Calcium 8.2 (L) 8.9 - 10.3 mg/dL   Total Protein 5.6 (L) 6.5 - 8.1 g/dL   Albumin  3.1 (L) 3.5 - 5.0 g/dL   AST 24 15 - 41 U/L   ALT 15 0 - 44 U/L   Alkaline Phosphatase 60 38 - 126 U/L   Total Bilirubin 1.6 (H) 0.3 - 1.2 mg/dL   GFR calc non Af Amer >60 >60 mL/min   GFR calc Af Amer >60 >60 mL/min   Anion gap 7 5 - 15    Comment: Performed at [facility], [address], [place], [place] [address]  CBG monitoring, ED     Status: Abnormal   Collection Time: 04/27/20  6:12 AM  Result Value Ref Range   Glucose-Capillary 115 (H) 70 - 99 mg/dL    Comment: Glucose reference range applies only to samples  taken after fasting for at least 8 hours.  CBG monitoring, ED     Status: Abnormal   Collection Time: 04/27/20  8:02 AM  Result Value Ref Range   Glucose-Capillary 117 (H) 70 - 99 mg/dL    Comment: Glucose reference range applies only to samples taken after fasting for at least 8 hours.  CBG monitoring, ED     Status: Abnormal   Collection Time: 04/27/20 11:53 AM  Result Value Ref Range   Glucose-Capillary 132 (H) 70 - 99 mg/dL    Comment: Glucose reference range applies only to samples taken after fasting for at least 8 hours.   ECHOCARDIOGRAM COMPLETE  Result Date: 04/27/2020    ECHOCARDIOGRAM REPORT   Patient Name:   Denise Parrish Date of Exam: 04/26/2020 Medical Rec #:  [id number]          Height:       65.0 in Accession #:    [id number]         Weight:       167.0 lb Date of Birth:  1922/12/29           BSA:          1.832 m Patient Age:    89 years           BP:           119/58 mmHg Patient Gender: F                  HR:           70 bpm. Exam Location:  [facility] Procedure: 2D Echo Indications:    Abnormal ECG 794.31 / R94.31  History:        Patient has prior history of Echocardiogram examinations, most                 recent 10/17/2019. TIA and Stroke; Risk Factors:Diabetes,                 Dyslipidemia, Hypertension and Non-Smoker.  Sonographer:    Leavy Cella RDCS (AE) Referring Phys: [id number] COURAGE EMOKPAE IMPRESSIONS  1. Left ventricular ejection fraction, by estimation, is 60 to 65%. The left ventricle has normal function. The left ventricle has no regional wall motion abnormalities. There is mild left ventricular hypertrophy. Left ventricular diastolic parameters are consistent with Grade II diastolic dysfunction (pseudonormalization). Elevated left atrial pressure.  2. Right ventricular systolic function is normal. The right ventricular size is normal. There is moderately elevated pulmonary artery systolic pressure.  3. The mitral valve is abnormal. Mild mitral valve  regurgitation.  4. AV is thickened, calcified with restricted motion. Peak and mean gradients through the vlave are 49 and 28 mm respectively consistent with moderate AS. Dimensionless  index is 0.37. Compareed to echo from Jan , gradients are decreased.. The aortic valve is abnormal. Aortic valve regurgitation is not visualized. Mild aortic valve sclerosis is present, with no evidence of aortic valve stenosis.  5. The inferior vena cava is normal in size with greater than 50% respiratory variability, suggesting right atrial pressure of 3 mmHg. FINDINGS  Left Ventricle: Left ventricular ejection fraction, by estimation, is 60 to 65%. The left ventricle has normal function. The left ventricle has no regional wall motion abnormalities. The left ventricular internal cavity size was small. There is mild left ventricular hypertrophy. Left ventricular diastolic parameters are consistent with Grade II diastolic dysfunction (pseudonormalization). Elevated left atrial pressure. Right Ventricle: The right ventricular size is normal. Right vetricular wall thickness was not assessed. Right ventricular systolic function is normal. There is moderately elevated pulmonary artery systolic pressure. The tricuspid regurgitant velocity is  3.27 m/s, and with an assumed right atrial pressure of 10 mmHg, the estimated right ventricular systolic pressure is 45.6 mmHg. Left Atrium: Left atrial size was normal in size. Right Atrium: Right atrial size was normal in size. Pericardium: There is no evidence of pericardial effusion. Mitral Valve: The mitral valve is abnormal. There is mild thickening of the mitral valve leaflet(s). Moderate mitral annular calcification. Mild mitral valve regurgitation. MV peak gradient, 18.5 mmHg. The mean mitral valve gradient is 6.0 mmHg. Tricuspid Valve: The tricuspid valve is normal in structure. Tricuspid valve regurgitation is mild. Aortic Valve: AV is thickened, calcified with restricted motion. Peak and  mean gradients through the vlave are 49 and 28 mm respectively consistent with moderate AS. Dimensionless index is 0.37. Compareed to echo from Jan , gradients are decreased. The aortic valve is abnormal. Aortic valve regurgitation is not visualized. Mild aortic valve sclerosis is present, with no evidence of aortic valve stenosis. Aortic valve mean gradient measures 28.3 mmHg. Aortic valve peak gradient measures 48.7 mmHg. Pulmonic Valve: The pulmonic valve was not well visualized. Pulmonic valve regurgitation is not visualized. No evidence of pulmonic stenosis. Aorta: The aortic root is normal in size and structure. Venous: The inferior vena cava is normal in size with greater than 50% respiratory variability, suggesting right atrial pressure of 3 mmHg. IAS/Shunts: The interatrial septum was not assessed.  LEFT VENTRICLE PLAX 2D LVIDd:         3.33 cm Diastology LVIDs:         2.22 cm LV e' lateral:   5.70 cm/s LV PW:         1.35 cm LV E/e' lateral: 35.1 LV IVS:        1.15 cm LV e' medial:    4.88 cm/s                        LV E/e' medial:  41.0  RIGHT VENTRICLE RV S prime:     16.20 cm/s TAPSE (M-mode): 2.4 cm LEFT ATRIUM             Index       RIGHT ATRIUM           Index LA diam:        3.90 cm 2.13 cm/m  RA Area:     13.20 cm LA Vol (A2C):   31.9 ml 17.41 ml/m RA Volume:   32.40 ml  17.68 ml/m LA Vol (A4C):   38.8 ml 21.18 ml/m LA Biplane Vol: 36.2 ml 19.76 ml/m  AORTIC VALVE AV Vmax:  349.00 cm/s AV Vmean:          251.667 cm/s AV VTI:            0.812 m AV Peak Grad:      48.7 mmHg AV Mean Grad:      28.3 mmHg LVOT Vmax:         133.33 cm/s LVOT Vmean:        93.867 cm/s LVOT VTI:          0.299 m LVOT/AV VTI ratio: 0.37  AORTA Ao Root diam: 3.40 cm MITRAL VALVE                TRICUSPID VALVE MV Area (PHT): 4.15 cm     TR Peak grad:   42.8 mmHg MV Peak grad:  18.5 mmHg    TR Vmax:        327.00 cm/s MV Mean grad:  6.0 mmHg MV Vmax:       2.15 m/s     SHUNTS MV Vmean:      113.0 cm/s    Systemic VTI: 0.30 m MV Decel Time: 183 msec MR Peak grad: 164.9 mmHg MR Mean grad: 88.0 mmHg MR Vmax:      642.00 cm/s MR Vmean:     421.0 cm/s MV E velocity: 200.00 cm/s MV A velocity: 196.00 cm/s MV E/A ratio:  1.02 Dorris Carnes MD Electronically signed by Dorris Carnes MD Signature Date/Time: 04/27/2020/12:16:06 AM    Final     Pending Labs Unresulted Labs (From admission, onward) Comment         None      Vitals/Pain Today's Vitals   04/27/20 1328 04/27/20 1329 04/27/20 1330 04/27/20 1331  BP:   (!) 117/41   Pulse: 71 67 65 67  Resp: 11 (!) 22 15 18   Temp:      TempSrc:      SpO2: 100% 100% 100% 100%  Weight:      Height:      PainSc:        Isolation Precautions No active isolations  Medications Medications  ondansetron (ZOFRAN) tablet 4 mg (has no administration in time range)    Or  ondansetron (ZOFRAN) injection 4 mg (has no administration in time range)  acetaminophen (TYLENOL) tablet 650 mg (has no administration in time range)    Or  acetaminophen (TYLENOL) suppository 650 mg (has no administration in time range)  pantoprazole (PROTONIX) injection 40 mg (40 mg Intravenous Given 04/27/20 0945)  LORazepam (ATIVAN) injection 0.25 mg (has no administration in time range)  metoprolol tartrate (LOPRESSOR) injection 5 mg (5 mg Intravenous Not Given 04/27/20 1330)  insulin aspart (novoLOG) injection 0-6 Units (0 Units Subcutaneous Not Given 04/27/20 1154)  0.9 %  sodium chloride infusion (0 mL/hr Intravenous Stopped 04/26/20 0816)    Mobility walks Moderate fall risk   Focused Assessments    R Recommendations: See Admitting Provider Note  Report given to:   Additional Notes:

## 2020-04-28 ENCOUNTER — Inpatient Hospital Stay (HOSPITAL_COMMUNITY): Payer: Medicare Other | Admitting: Anesthesiology

## 2020-04-28 ENCOUNTER — Inpatient Hospital Stay (HOSPITAL_COMMUNITY): Payer: Medicare Other

## 2020-04-28 ENCOUNTER — Encounter (HOSPITAL_COMMUNITY)
Admission: EM | Disposition: A | Discharge status: Home Health | Payer: Self-pay | Source: Home / Self Care | Attending: Internal Medicine

## 2020-04-28 DIAGNOSIS — K922 Gastrointestinal hemorrhage, unspecified: Secondary | ICD-10-CM | POA: Diagnosis not present

## 2020-04-28 DIAGNOSIS — I10 Essential (primary) hypertension: Secondary | ICD-10-CM | POA: Diagnosis not present

## 2020-04-28 DIAGNOSIS — E782 Mixed hyperlipidemia: Secondary | ICD-10-CM | POA: Diagnosis not present

## 2020-04-28 DIAGNOSIS — I248 Other forms of acute ischemic heart disease: Secondary | ICD-10-CM

## 2020-04-28 DIAGNOSIS — E119 Type 2 diabetes mellitus without complications: Secondary | ICD-10-CM | POA: Diagnosis not present

## 2020-04-28 DIAGNOSIS — I350 Nonrheumatic aortic (valve) stenosis: Secondary | ICD-10-CM

## 2020-04-28 HISTORY — PX: ESOPHAGOGASTRODUODENOSCOPY (EGD) WITH PROPOFOL: SHX5813

## 2020-04-28 HISTORY — PX: POLYPECTOMY: SHX5525

## 2020-04-28 LAB — CBC WITH DIFFERENTIAL/PLATELET
Abs Immature Granulocytes: 0.04 10*3/uL (ref 0.00–0.07)
Basophils Absolute: 0 10*3/uL (ref 0.0–0.1)
Basophils Relative: 0 %
Eosinophils Absolute: 0.1 10*3/uL (ref 0.0–0.5)
Eosinophils Relative: 2 %
HCT: 27.5 % — ABNORMAL LOW (ref 36.0–46.0)
Hemoglobin: 8.8 g/dL — ABNORMAL LOW (ref 12.0–15.0)
Immature Granulocytes: 1 %
Lymphocytes Relative: 25 %
Lymphs Abs: 2.1 10*3/uL (ref 0.7–4.0)
MCH: 28.2 pg (ref 26.0–34.0)
MCHC: 32 g/dL (ref 30.0–36.0)
MCV: 88.1 fL (ref 80.0–100.0)
Monocytes Absolute: 0.7 10*3/uL (ref 0.1–1.0)
Monocytes Relative: 9 %
Neutro Abs: 5.5 10*3/uL (ref 1.7–7.7)
Neutrophils Relative %: 63 %
Platelets: 243 10*3/uL (ref 150–400)
RBC: 3.12 MIL/uL — ABNORMAL LOW (ref 3.87–5.11)
RDW: 17.8 % — ABNORMAL HIGH (ref 11.5–15.5)
WBC: 8.5 10*3/uL (ref 4.0–10.5)
nRBC: 0 % (ref 0.0–0.2)

## 2020-04-28 LAB — BASIC METABOLIC PANEL
Anion gap: 11 (ref 5–15)
BUN: 12 mg/dL (ref 8–23)
CO2: 24 mmol/L (ref 22–32)
Calcium: 8.8 mg/dL — ABNORMAL LOW (ref 8.9–10.3)
Chloride: 101 mmol/L (ref 98–111)
Creatinine, Ser: 1.09 mg/dL — ABNORMAL HIGH (ref 0.44–1.00)
GFR calc Af Amer: 50 mL/min — ABNORMAL LOW (ref >60)
GFR calc non Af Amer: 43 mL/min — ABNORMAL LOW (ref >60)
Glucose, Bld: 187 mg/dL — ABNORMAL HIGH (ref 70–99)
Potassium: 3.6 mmol/L (ref 3.5–5.1)
Sodium: 136 mmol/L (ref 135–145)

## 2020-04-28 LAB — GLUCOSE, CAPILLARY
Glucose-Capillary: 113 mg/dL — ABNORMAL HIGH (ref 70–99)
Glucose-Capillary: 114 mg/dL — ABNORMAL HIGH (ref 70–99)
Glucose-Capillary: 119 mg/dL — ABNORMAL HIGH (ref 70–99)
Glucose-Capillary: 122 mg/dL — ABNORMAL HIGH (ref 70–99)
Glucose-Capillary: 123 mg/dL — ABNORMAL HIGH (ref 70–99)
Glucose-Capillary: 127 mg/dL — ABNORMAL HIGH (ref 70–99)

## 2020-04-28 LAB — MAGNESIUM: Magnesium: 1.9 mg/dL (ref 1.7–2.4)

## 2020-04-28 SURGERY — EGD (ESOPHAGOGASTRODUODENOSCOPY)
Anesthesia: Monitor Anesthesia Care

## 2020-04-28 SURGERY — ESOPHAGOGASTRODUODENOSCOPY (EGD) WITH PROPOFOL
Anesthesia: Monitor Anesthesia Care

## 2020-04-28 MED ORDER — LACTATED RINGERS IV SOLN: INTRAVENOUS | Status: DC | PRN

## 2020-04-28 MED ORDER — PROPOFOL 10 MG/ML IV BOLUS
INTRAVENOUS | Status: DC | PRN
  Administered 2020-04-28: 15 mg via INTRAVENOUS

## 2020-04-28 MED ORDER — PHENYLEPHRINE 40 MCG/ML (10ML) SYRINGE FOR IV PUSH (FOR BLOOD PRESSURE SUPPORT)
PREFILLED_SYRINGE | INTRAVENOUS | Status: DC | PRN
  Administered 2020-04-28: 80 ug via INTRAVENOUS

## 2020-04-28 MED ORDER — PROPOFOL 500 MG/50ML IV EMUL
INTRAVENOUS | Status: DC | PRN
  Administered 2020-04-28: 50 ug/kg/min via INTRAVENOUS

## 2020-04-28 MED ORDER — IOHEXOL 300 MG/ML  SOLN
80.0000 mL | Freq: Once | INTRAMUSCULAR | Status: AC | PRN
  Administered 2020-04-28: 80 mL via INTRAVENOUS

## 2020-04-28 MED ORDER — LIDOCAINE HCL (CARDIAC) PF 100 MG/5ML IV SOSY
PREFILLED_SYRINGE | INTRAVENOUS | Status: DC | PRN
  Administered 2020-04-28: 40 mg via INTRAVENOUS

## 2020-04-28 MED ORDER — IOHEXOL 9 MG/ML PO SOLN
500.0000 mL | ORAL | Status: AC
  Administered 2020-04-28: 500 mL via ORAL

## 2020-04-28 SURGICAL SUPPLY — 15 items

## 2020-04-28 NOTE — Progress Notes (Signed)
Patient ID: Denise Parrish, female   DOB: 1923-02-05, 86 y.o.   MRN: [id number]  PROGRESS NOTE    Denise Parrish  WLN:[id number] DOB: [birth year] DOA: 04/26/2020 PCP: [name], MD   Brief Narrative:  86 year old female with history of palpitations, minimal CAD on cath in 2005, moderate to severe AS on echo in 09/27/2019 with normal LVEF 60 to 65% and grade 1 diastolic dysfunction, hypertension, diabetes mellitus type 2, hyperlipidemia, unspecified CVA in January 2021 and TIA in July 2021 for which she was on aspirin and Plavix, status post CEA on 10/21/2019 was admitted on 04/26/2020 with weakness and melena.  Hemoglobin was 7.2 on admission for which she required packed red cell transfusion.  GI and cardiology were consulted.  She was transferred to [facility] for further GI work-up as anesthesia at [facility] did not feel comfortable with providing anesthesia at that facility.  Assessment & Plan:  Acute blood loss anemia Probable upper GI bleeding -Patient presented with hemoglobin of 7.2, hemoglobin was 11.9 on 04/07/2020.  Status post packed red cell transfusion.  Hemoglobin 8.5 on 04/27/2020.  No hemoglobin available for today.  Will order stat labs. -Currently on Protonix 40 mg IV every 12 hours.  Patient has been transferred to [facility] for further GI evaluation. -We will switch to n.p.o.  Spoke to Dr. Bilton Che on phone regarding the need for GI consultation.  Unspecified CVA in January 2021 and TIA in July 2021 Recent right CEA on 2-21 for symptomatic right carotid artery stenosis -Patient was on aspirin 325 mg and Plavix 75 mg prior to presentation.  Both of these are on hold. -Resume Lipitor once patient has been resumed on diet.  Elevated troponin -Most likely due to demand ischemia.  Cardiology following and recommending medical management.  Echo with preserved EF of 60 to 65% with grade 2 diastolic dysfunction with moderately elevated pulmonary  artery systolic pressure.  Aortic stenosis -Moderate as per cardiology documentation.  Chronic diastolic heart failure -Compensated.  Echo as above.  Strict input output.  Daily weights.  Cardiology following.  Hypertension -Oral meds on hold.  Monitor blood pressure.  Blood pressure on the lower side  Diabetes mellitus type 2 -A1c 7.  Amaryl on hold -Continue CBGs with SSI  Anxiety disorder -Lorazepam as needed  Generalized conditioning -will need PT eval.  Palliative care following.  Patient remains full code.   DVT prophylaxis: SCDs Code Status: Full Family Communication: Patient at bedside Disposition Plan: Status is: Inpatient  Remains inpatient appropriate because:IV treatments appropriate due to intensity of illness or inability to take PO   Dispo:  Patient From: Home  Planned Disposition: [place]  Expected discharge date: 04/29/2020-04/30/2020  Medically stable for discharge: No    Consultants: GI/cardiology/palliative care  Procedures:  Echo 1. Left ventricular ejection fraction, by estimation, is 60 to 65%. The  left ventricle has normal function. The left ventricle has no regional  wall motion abnormalities. There is mild left ventricular hypertrophy.  Left ventricular diastolic parameters  are consistent with Grade II diastolic dysfunction (pseudonormalization).  Elevated left atrial pressure.  2. Right ventricular systolic function is normal. The right ventricular  size is normal. There is moderately elevated pulmonary artery systolic  pressure.  3. The mitral valve is abnormal. Mild mitral valve regurgitation.  4. AV is thickened, calcified with restricted motion. Peak and mean  gradients through the vlave are 49 and 28 mm respectively consistent with  moderate AS. Dimensionless  index is 0.37. Compareed to echo from Jan ,  gradients are decreased.. The aortic  valve is abnormal. Aortic valve regurgitation is not visualized. Mild   aortic valve sclerosis is present, with no evidence of aortic valve  stenosis.  5. The inferior vena cava is normal in size with greater than 50%  respiratory variability, suggesting right atrial pressure of 3 mmHg.   Antimicrobials: None   Subjective: Patient seen and examined at bedside.  She denies any black or bloody bowel movement for the last 24 hours.  No overnight fever, nausea, vomiting or abdominal pain reported.  Objective: Vitals:   04/27/20 1914 04/28/20 0011 04/28/20 0422 04/28/20 0723  BP: (!) 107/55 (!) 123/56 (!) 104/55 (!) 118/59  Pulse: 73 92 68 91  Resp: 18 18 18 16   Temp: 98.6 F (37 C) 99 F (37.2 C) 98.2 F (36.8 C) 98.1 F (36.7 C)  TempSrc: Oral Oral Oral Oral  SpO2: 97% 96% 92% 93%  Weight:   75.5 kg   Height:        Intake/Output Summary (Last 24 hours) at 04/28/2020 0915 Last data filed at 04/28/2020 0758 Gross per 24 hour  Intake 600 ml  Output 800 ml  Net -200 ml   Filed Weights   04/26/20 0119 04/27/20 1850 04/28/20 0422  Weight: 75.8 kg 75.5 kg 75.5 kg    Examination:  General exam: Appears calm and comfortable.  Slightly hard of hearing Respiratory system: Bilateral decreased breath sounds at bases with some scattered crackles Cardiovascular system: S1 & S2 heard, Rate controlled Gastrointestinal system: Abdomen is nondistended, soft and nontender. Normal bowel sounds heard. Extremities: No cyanosis, clubbing; trace lower extremity edema  Central nervous system: Alert and oriented. No focal neurological deficits. Moving extremities Skin: No rashes, lesions or ulcers Psychiatry: Judgement and insight appear normal. Mood & affect appropriate.     Data Reviewed: I have personally reviewed following labs and imaging studies  CBC: Recent Labs  Lab 04/26/20 0236 04/26/20 0853 04/26/20 1455 04/26/20 2049 04/27/20 0528  WBC 9.9 9.5  --   --  9.2  NEUTROABS 7.4  --   --   --   --   HGB 7.2* 8.8* 8.7* 8.0* 8.5*  HCT 23.4* 28.1*  27.1* 25.4* 26.9*  MCV 91.8 90.6  --   --  90.3  PLT 231 225  --   --  678   Basic Metabolic Panel: Recent Labs  Lab 04/26/20 0236 04/26/20 0853 04/27/20 0528  NA 139 139 140  K 4.0 3.8 4.2  CL 109 109 108  CO2 22 22 25   GLUCOSE 167* 110* 111*  BUN 28* 23 16  CREATININE 0.98 0.81 0.82  CALCIUM 8.3* 8.3* 8.2*   GFR: Estimated Creatinine Clearance: 41.7 mL/min (by C-G formula based on SCr of 0.82 mg/dL). Liver Function Tests: Recent Labs  Lab 04/26/20 0236 04/26/20 0853 04/27/20 0528  AST 21 25 24   ALT 16 14 15   ALKPHOS 57 57 60  BILITOT 0.7 2.7* 1.6*  PROT 6.0* 5.8* 5.6*  ALBUMIN 3.3* 3.2* 3.1*   No results for input(s): LIPASE, AMYLASE in the last 168 hours. No results for input(s): AMMONIA in the last 168 hours. Coagulation Profile: Recent Labs  Lab 04/26/20 0236  INR 1.0   Cardiac Enzymes: No results for input(s): CKTOTAL, CKMB, CKMBINDEX, TROPONINI in the last 168 hours. BNP (last 3 results) No results for input(s): PROBNP in the last 8760 hours. HbA1C: No results for input(s): HGBA1C in the  last 72 hours. CBG: Recent Labs  Lab 04/27/20 0802 04/27/20 1153 04/27/20 1934 04/28/20 0028 04/28/20 0557  GLUCAP 117* 132* 118* 127* 122*   Lipid Profile: No results for input(s): CHOL, HDL, LDLCALC, TRIG, CHOLHDL, LDLDIRECT in the last 72 hours. Thyroid Function Tests: No results for input(s): TSH, T4TOTAL, FREET4, T3FREE, THYROIDAB in the last 72 hours. Anemia Panel: No results for input(s): VITAMINB12, FOLATE, FERRITIN, TIBC, IRON, RETICCTPCT in the last 72 hours. Sepsis Labs: No results for input(s): PROCALCITON, LATICACIDVEN in the last 168 hours.  Recent Results (from the past 240 hour(s))  SARS Coronavirus 2 by RT PCR (hospital order, performed in [facility] hospital lab) Nasopharyngeal Nasopharyngeal Swab     Status: None   Collection Time: 04/26/20  1:54 AM   Specimen: Nasopharyngeal Swab  Result Value Ref Range Status   SARS Coronavirus 2  NEGATIVE NEGATIVE Final    Comment: (NOTE) SARS-CoV-2 target nucleic acids are NOT DETECTED.  The SARS-CoV-2 RNA is generally detectable in upper and lower respiratory specimens during the acute phase of infection. The lowest concentration of SARS-CoV-2 viral copies this assay can detect is 250 copies / mL. A negative result does not preclude SARS-CoV-2 infection and should not be used as the sole basis for treatment or other patient management decisions.  A negative result may occur with improper specimen collection / handling, submission of specimen other than nasopharyngeal swab, presence of viral mutation(s) within the areas targeted by this assay, and inadequate number of viral copies (<250 copies / mL). A negative result must be combined with clinical observations, patient history, and epidemiological information.  Fact Sheet for Patients:   StrictlyIdeas.no  Fact Sheet for Healthcare Providers: BankingDealers.co.za  This test is not yet approved or  cleared by the [place] FDA and has been authorized for detection and/or diagnosis of SARS-CoV-2 by FDA under an Emergency Use Authorization (EUA).  This EUA will remain in effect (meaning this test can be used) for the duration of the COVID-19 declaration under Section 564(b)(1) of the Act, 21 U.S.C. section 360bbb-3(b)(1), unless the authorization is terminated or revoked sooner.  Performed at [facility], [address], [place], [place] [address]          Radiology Studies: ECHOCARDIOGRAM COMPLETE  Result Date: 04/27/2020    ECHOCARDIOGRAM REPORT   Patient Name:   VENERA PRIVOTT Date of Exam: 04/26/2020 Medical Rec #:  [id number]          Height:       65.0 in Accession #:    [id number]         Weight:       167.0 lb Date of Birth:  1923-03-11           BSA:          1.832 m Patient Age:    89 years           BP:           119/58 mmHg Patient Gender: F                   HR:           70 bpm. Exam Location:  [facility] Procedure: 2D Echo Indications:    Abnormal ECG 794.31 / R94.31  History:        Patient has prior history of Echocardiogram examinations, most                 recent 10/17/2019. TIA and  Stroke; Risk Factors:Diabetes,                 Dyslipidemia, Hypertension and Non-Smoker.  Sonographer:    Leavy Cella RDCS (AE) Referring Phys: [id number] COURAGE EMOKPAE IMPRESSIONS  1. Left ventricular ejection fraction, by estimation, is 60 to 65%. The left ventricle has normal function. The left ventricle has no regional wall motion abnormalities. There is mild left ventricular hypertrophy. Left ventricular diastolic parameters are consistent with Grade II diastolic dysfunction (pseudonormalization). Elevated left atrial pressure.  2. Right ventricular systolic function is normal. The right ventricular size is normal. There is moderately elevated pulmonary artery systolic pressure.  3. The mitral valve is abnormal. Mild mitral valve regurgitation.  4. AV is thickened, calcified with restricted motion. Peak and mean gradients through the vlave are 49 and 28 mm respectively consistent with moderate AS. Dimensionless index is 0.37. Compareed to echo from Jan , gradients are decreased.. The aortic valve is abnormal. Aortic valve regurgitation is not visualized. Mild aortic valve sclerosis is present, with no evidence of aortic valve stenosis.  5. The inferior vena cava is normal in size with greater than 50% respiratory variability, suggesting right atrial pressure of 3 mmHg. FINDINGS  Left Ventricle: Left ventricular ejection fraction, by estimation, is 60 to 65%. The left ventricle has normal function. The left ventricle has no regional wall motion abnormalities. The left ventricular internal cavity size was small. There is mild left ventricular hypertrophy. Left ventricular diastolic parameters are consistent with Grade II diastolic dysfunction (pseudonormalization). Elevated left  atrial pressure. Right Ventricle: The right ventricular size is normal. Right vetricular wall thickness was not assessed. Right ventricular systolic function is normal. There is moderately elevated pulmonary artery systolic pressure. The tricuspid regurgitant velocity is  3.27 m/s, and with an assumed right atrial pressure of 10 mmHg, the estimated right ventricular systolic pressure is 07.8 mmHg. Left Atrium: Left atrial size was normal in size. Right Atrium: Right atrial size was normal in size. Pericardium: There is no evidence of pericardial effusion. Mitral Valve: The mitral valve is abnormal. There is mild thickening of the mitral valve leaflet(s). Moderate mitral annular calcification. Mild mitral valve regurgitation. MV peak gradient, 18.5 mmHg. The mean mitral valve gradient is 6.0 mmHg. Tricuspid Valve: The tricuspid valve is normal in structure. Tricuspid valve regurgitation is mild. Aortic Valve: AV is thickened, calcified with restricted motion. Peak and mean gradients through the vlave are 49 and 28 mm respectively consistent with moderate AS. Dimensionless index is 0.37. Compareed to echo from Jan , gradients are decreased. The aortic valve is abnormal. Aortic valve regurgitation is not visualized. Mild aortic valve sclerosis is present, with no evidence of aortic valve stenosis. Aortic valve mean gradient measures 28.3 mmHg. Aortic valve peak gradient measures 48.7 mmHg. Pulmonic Valve: The pulmonic valve was not well visualized. Pulmonic valve regurgitation is not visualized. No evidence of pulmonic stenosis. Aorta: The aortic root is normal in size and structure. Venous: The inferior vena cava is normal in size with greater than 50% respiratory variability, suggesting right atrial pressure of 3 mmHg. IAS/Shunts: The interatrial septum was not assessed.  LEFT VENTRICLE PLAX 2D LVIDd:         3.33 cm Diastology LVIDs:         2.22 cm LV e' lateral:   5.70 cm/s LV PW:         1.35 cm LV E/e' lateral:  35.1 LV IVS:        1.15 cm LV e'  medial:    4.88 cm/s                        LV E/e' medial:  41.0  RIGHT VENTRICLE RV S prime:     16.20 cm/s TAPSE (M-mode): 2.4 cm LEFT ATRIUM             Index       RIGHT ATRIUM           Index LA diam:        3.90 cm 2.13 cm/m  RA Area:     13.20 cm LA Vol (A2C):   31.9 ml 17.41 ml/m RA Volume:   32.40 ml  17.68 ml/m LA Vol (A4C):   38.8 ml 21.18 ml/m LA Biplane Vol: 36.2 ml 19.76 ml/m  AORTIC VALVE AV Vmax:           349.00 cm/s AV Vmean:          251.667 cm/s AV VTI:            0.812 m AV Peak Grad:      48.7 mmHg AV Mean Grad:      28.3 mmHg LVOT Vmax:         133.33 cm/s LVOT Vmean:        93.867 cm/s LVOT VTI:          0.299 m LVOT/AV VTI ratio: 0.37  AORTA Ao Root diam: 3.40 cm MITRAL VALVE                TRICUSPID VALVE MV Area (PHT): 4.15 cm     TR Peak grad:   42.8 mmHg MV Peak grad:  18.5 mmHg    TR Vmax:        327.00 cm/s MV Mean grad:  6.0 mmHg MV Vmax:       2.15 m/s     SHUNTS MV Vmean:      113.0 cm/s   Systemic VTI: 0.30 m MV Decel Time: 183 msec MR Peak grad: 164.9 mmHg MR Mean grad: 88.0 mmHg MR Vmax:      642.00 cm/s MR Vmean:     421.0 cm/s MV E velocity: 200.00 cm/s MV A velocity: 196.00 cm/s MV E/A ratio:  1.02 Dorris Carnes MD Electronically signed by Dorris Carnes MD Signature Date/Time: 04/27/2020/12:16:06 AM    Final         Scheduled Meds: . [MAR Hold] insulin aspart  0-6 Units Subcutaneous TID WC  . [MAR Hold] metoprolol tartrate  5 mg Intravenous Q8H  . [MAR Hold] pantoprazole (PROTONIX) IV  40 mg Intravenous Q12H   Continuous Infusions:        Aline August, MD [facility] 04/28/2020, 9:15 AM \

## 2020-04-28 NOTE — Transfer of Care (Signed)
Immediate Anesthesia Transfer of Care Note  Patient: Denise Parrish  Procedure(s) Performed: ESOPHAGOGASTRODUODENOSCOPY (EGD) WITH PROPOFOL (N/A ) POLYPECTOMY  Patient Location: Endoscopy Unit  Anesthesia Type:MAC  Level of Consciousness: awake, alert  and oriented  Airway & Oxygen Therapy: Patient Spontanous Breathing and Patient connected to nasal cannula oxygen  Post-op Assessment: Report given to RN, Post -op Vital signs reviewed and stable and Patient moving all extremities X 4  Post vital signs: Reviewed and stable  Last Vitals:  Vitals Value Taken Time  BP 117/36 04/28/20 1009  Temp    Pulse 91 04/28/20 1009  Resp 14 04/28/20 1009  SpO2 96 % 04/28/20 1009  Vitals shown include unvalidated device data.  Last Pain:  Vitals:   04/28/20 0920  TempSrc: Oral  PainSc: 0-No pain         Complications: No complications documented.

## 2020-04-28 NOTE — Progress Notes (Signed)
Patient O2 Sats dropped to 87-88 on RA, BP 94/37, lungs clear, patient does not feel sob.  Contacted Dr. Aleene Davidson to assess. Sats return to 99 on 2L [place] O2 and BP 124/56.  Per Dr. Gifford Shave,  will keep patient on 2 L [place] for now and discharge back to room.

## 2020-04-28 NOTE — Progress Notes (Signed)
DAILY PROGRESS NOTE   Patient Name: Denise Parrish Date of Encounter: 04/28/2020 Cardiologist: Dorris Carnes, MD  Chief Complaint   Denies chest pain  Patient Profile   Denise Parrish is a 86 y.o. female with a hx of mod-severe AS who is being seen today for the evaluation of chest pain at the request of Dr. Jeannetta Nap  Subjective   No issues overnight - BP Stable. Transferred to [facility] for GI work-up. Cardiology evaluation at [facility] - recommended medical therapy for NSTEMI and at least moderate AS given age and co-morbidities. H/H 8.5/26.9 today. Troponin trended up to 1891.   Objective   Vitals:   04/27/20 1914 04/28/20 0011 04/28/20 0422 04/28/20 0723  BP: (!) 107/55 (!) 123/56 (!) 104/55 (!) 118/59  Pulse: 73 92 68 91  Resp: 18 18 18 16   Temp: 98.6 F (37 C) 99 F (37.2 C) 98.2 F (36.8 C) 98.1 F (36.7 C)  TempSrc: Oral Oral Oral Oral  SpO2: 97% 96% 92% 93%  Weight:   75.5 kg   Height:        Intake/Output Summary (Last 24 hours) at 04/28/2020 0747 Last data filed at 04/28/2020 0248 Gross per 24 hour  Intake 300 ml  Output 800 ml  Net -500 ml   Filed Weights   04/26/20 0119 04/27/20 1850 04/28/20 0422  Weight: 75.8 kg 75.5 kg 75.5 kg    Physical Exam   General appearance: alert and no distress Neck: no carotid bruit, no JVD and thyroid not enlarged, symmetric, no tenderness/mass/nodules Lungs: clear to auscultation bilaterally Heart: regular rate and rhythm, S1, S2 normal and systolic murmur: systolic ejection 3/6, crescendo at 2nd right intercostal space Abdomen: soft, non-tender; bowel sounds normal; no masses,  no organomegaly Extremities: extremities normal, atraumatic, no cyanosis or edema Pulses: 2+ and symmetric Skin: Skin color, texture, turgor normal. No rashes or lesions Neurologic: Grossly normal Psych: Pleasant  Inpatient Medications    Scheduled Meds: . insulin aspart  0-6 Units Subcutaneous TID WC  . metoprolol tartrate  5 mg Intravenous  Q8H  . pantoprazole (PROTONIX) IV  40 mg Intravenous Q12H    Continuous Infusions:   PRN Meds: acetaminophen **OR** acetaminophen, LORazepam, ondansetron **OR** ondansetron (ZOFRAN) IV   Labs   Results for orders placed or performed during the hospital encounter of 04/26/20 (from the past 48 hour(s))  CBG monitoring, ED     Status: Abnormal   Collection Time: 04/26/20  8:08 AM  Result Value Ref Range   Glucose-Capillary 108 (H) 70 - 99 mg/dL    Comment: Glucose reference range applies only to samples taken after fasting for at least 8 hours.  CBC     Status: Abnormal   Collection Time: 04/26/20  8:53 AM  Result Value Ref Range   WBC 9.5 4.0 - 10.5 K/uL   RBC 3.10 (L) 3.87 - 5.11 MIL/uL   Hemoglobin 8.8 (L) 12.0 - 15.0 g/dL   HCT 28.1 (L) 36 - 46 %   MCV 90.6 80.0 - 100.0 fL   MCH 28.4 26.0 - 34.0 pg   MCHC 31.3 30.0 - 36.0 g/dL   RDW 17.4 (H) 11.5 - 15.5 %   Platelets 225 150 - 400 K/uL   nRBC 0.0 0.0 - 0.2 %    Comment: Performed at [facility], [address], [place], [place] [address]  Comprehensive metabolic panel     Status: Abnormal   Collection Time: 04/26/20  8:53 AM  Result Value Ref Range  Sodium 139 135 - 145 mmol/L   Potassium 3.8 3.5 - 5.1 mmol/L   Chloride 109 98 - 111 mmol/L   CO2 22 22 - 32 mmol/L   Glucose, Bld 110 (H) 70 - 99 mg/dL    Comment: Glucose reference range applies only to samples taken after fasting for at least 8 hours.   BUN 23 8 - 23 mg/dL   Creatinine, Ser 0.81 0.44 - 1.00 mg/dL   Calcium 8.3 (L) 8.9 - 10.3 mg/dL   Total Protein 5.8 (L) 6.5 - 8.1 g/dL   Albumin 3.2 (L) 3.5 - 5.0 g/dL   AST 25 15 - 41 U/L   ALT 14 0 - 44 U/L   Alkaline Phosphatase 57 38 - 126 U/L   Total Bilirubin 2.7 (H) 0.3 - 1.2 mg/dL   GFR calc non Af Amer >60 >60 mL/min   GFR calc Af Amer >60 >60 mL/min   Anion gap 8 5 - 15    Comment: Performed at [facility], [address], [address] [place], [place] [address]  Troponin I (High Sensitivity)     Status: Abnormal    Collection Time: 04/26/20  8:53 AM  Result Value Ref Range   Troponin I (High Sensitivity) 1,314 (HH) <18 ng/L    Comment: CRITICAL RESULT CALLED TO, READ BACK BY AND VERIFIED WITH: CRABTREE,BETH @0937  ON 04/26/20 BY JONES,TAYLOR (NOTE) Elevated high sensitivity troponin I (hsTnI) values and significant  changes across serial measurements may suggest ACS but many other  chronic and acute conditions are known to elevate hsTnI results.  Refer to the Links section for chest pain algorithms and additional  guidance. Performed at [facility], [address] [place], [place] [address]   Troponin I (High Sensitivity)     Status: Abnormal   Collection Time: 04/26/20 11:43 AM  Result Value Ref Range   Troponin I (High Sensitivity) 1,891 (HH) <18 ng/L    Comment: CRITICAL RESULT CALLED TO, READ BACK BY AND VERIFIED WITH: DEVOLT,T AT 12:20PM ON 04/26/20 BY FESTERMAN,C (NOTE) Elevated high sensitivity troponin I (hsTnI) values and significant  changes across serial measurements may suggest ACS but many other  chronic and acute conditions are known to elevate hsTnI results.  Refer to the Links section for chest pain algorithms and additional  guidance. Performed at [facility], [address], [place], [place] [address]   Hemoglobin and hematocrit, blood     Status: Abnormal   Collection Time: 04/26/20  2:55 PM  Result Value Ref Range   Hemoglobin 8.7 (L) 12.0 - 15.0 g/dL   HCT 27.1 (L) 36 - 46 %    Comment: Performed at [facility], [address], [place], [place] [address]  CBG monitoring, ED     Status: None   Collection Time: 04/26/20  6:02 PM  Result Value Ref Range   Glucose-Capillary 98 70 - 99 mg/dL    Comment: Glucose reference range applies only to samples taken after fasting for at least 8 hours.  Hemoglobin and hematocrit, blood     Status: Abnormal   Collection Time: 04/26/20  8:49 PM  Result Value Ref Range   Hemoglobin 8.0 (L) 12.0 - 15.0 g/dL   HCT 25.4 (L) 36 - 46 %     Comment: Performed at [facility], [address] [place],  [address]  CBG monitoring, ED     Status: Abnormal   Collection Time: 04/26/20 10:24 PM  Result Value Ref Range   Glucose-Capillary 158 (H) 70 -  99 mg/dL    Comment: Glucose reference range applies only to samples taken after fasting for at least 8 hours.  CBC     Status: Abnormal   Collection Time: 04/27/20  5:28 AM  Result Value Ref Range   WBC 9.2 4.0 - 10.5 K/uL   RBC 2.98 (L) 3.87 - 5.11 MIL/uL   Hemoglobin 8.5 (L) 12.0 - 15.0 g/dL   HCT 26.9 (L) 36 - 46 %   MCV 90.3 80.0 - 100.0 fL   MCH 28.5 26.0 - 34.0 pg   MCHC 31.6 30.0 - 36.0 g/dL   RDW 18.4 (H) 11.5 - 15.5 %   Platelets 206 150 - 400 K/uL   nRBC 0.0 0.0 - 0.2 %    Comment: Performed at [facility], [address] [place], [place] [address]  Comprehensive metabolic panel     Status: Abnormal   Collection Time: 04/27/20  5:28 AM  Result Value Ref Range   Sodium 140 135 - 145 mmol/L   Potassium 4.2 3.5 - 5.1 mmol/L   Chloride 108 98 - 111 mmol/L   CO2 25 22 - 32 mmol/L   Glucose, Bld 111 (H) 70 - 99 mg/dL    Comment: Glucose reference range applies only to samples taken after fasting for at least 8 hours.   BUN 16 8 - 23 mg/dL   Creatinine, Ser 0.82 0.44 - 1.00 mg/dL   Calcium 8.2 (L) 8.9 - 10.3 mg/dL   Total Protein 5.6 (L) 6.5 - 8.1 g/dL   Albumin 3.1 (L) 3.5 - 5.0 g/dL   AST 24 15 - 41 U/L   ALT 15 0 - 44 U/L   Alkaline Phosphatase 60 38 - 126 U/L   Total Bilirubin 1.6 (H) 0.3 - 1.2 mg/dL   GFR calc non Af Amer >60 >60 mL/min   GFR calc Af Amer >60 >60 mL/min   Anion gap 7 5 - 15    Comment: Performed at [facility], [address], [place], [place] [address]  CBG monitoring, ED     Status: Abnormal   Collection Time: 04/27/20  6:12 AM  Result Value Ref Range   Glucose-Capillary 115 (H) 70 - 99 mg/dL    Comment: Glucose reference range applies only to samples taken after fasting for at least 8 hours.  CBG monitoring, ED     Status: Abnormal    Collection Time: 04/27/20  8:02 AM  Result Value Ref Range   Glucose-Capillary 117 (H) 70 - 99 mg/dL    Comment: Glucose reference range applies only to samples taken after fasting for at least 8 hours.  CBG monitoring, ED     Status: Abnormal   Collection Time: 04/27/20 11:53 AM  Result Value Ref Range   Glucose-Capillary 132 (H) 70 - 99 mg/dL    Comment: Glucose reference range applies only to samples taken after fasting for at least 8 hours.  Glucose, capillary     Status: Abnormal   Collection Time: 04/27/20  7:34 PM  Result Value Ref Range   Glucose-Capillary 118 (H) 70 - 99 mg/dL    Comment: Glucose reference range applies only to samples taken after fasting for at least 8 hours.  Type and screen [place]     Status: None   Collection Time: 04/27/20  9:24 PM  Result Value Ref Range   ABO/RH(D) AB POS    Antibody Screen NEG    Sample Expiration      04/30/2020,2359 Performed  at [facility], [place] [address] [place], [place] [address]   Glucose, capillary     Status: Abnormal   Collection Time: 04/28/20 12:28 AM  Result Value Ref Range   Glucose-Capillary 127 (H) 70 - 99 mg/dL    Comment: Glucose reference range applies only to samples taken after fasting for at least 8 hours.   Comment 1 Notify RN    Comment 2 Document in Chart   Glucose, capillary     Status: Abnormal   Collection Time: 04/28/20  5:57 AM  Result Value Ref Range   Glucose-Capillary 122 (H) 70 - 99 mg/dL    Comment: Glucose reference range applies only to samples taken after fasting for at least 8 hours.   Comment 1 Notify RN    Comment 2 Document in Chart     ECG   N/A - Personally Reviewed  Telemetry   Sinus rhythm - Personally Reviewed  Radiology    ECHOCARDIOGRAM COMPLETE  Result Date: 04/27/2020    ECHOCARDIOGRAM REPORT   Patient Name:   Denise Parrish Date of Exam: 04/26/2020 Medical Rec #:  [id number]          Height:       65.0 in Accession #:    [id number]          Weight:       167.0 lb Date of Birth:  1923/04/19           BSA:          1.832 m Patient Age:    89 years           BP:           119/58 mmHg Patient Gender: F                  HR:           70 bpm. Exam Location:  [facility] Procedure: 2D Echo Indications:    Abnormal ECG 794.31 / R94.31  History:        Patient has prior history of Echocardiogram examinations, most                 recent 10/17/2019. TIA and Stroke; Risk Factors:Diabetes,                 Dyslipidemia, Hypertension and Non-Smoker.  Sonographer:    Leavy Cella RDCS (AE) Referring Phys: [id number] COURAGE EMOKPAE IMPRESSIONS  1. Left ventricular ejection fraction, by estimation, is 60 to 65%. The left ventricle has normal function. The left ventricle has no regional wall motion abnormalities. There is mild left ventricular hypertrophy. Left ventricular diastolic parameters are consistent with Grade II diastolic dysfunction (pseudonormalization). Elevated left atrial pressure.  2. Right ventricular systolic function is normal. The right ventricular size is normal. There is moderately elevated pulmonary artery systolic pressure.  3. The mitral valve is abnormal. Mild mitral valve regurgitation.  4. AV is thickened, calcified with restricted motion. Peak and mean gradients through the vlave are 49 and 28 mm respectively consistent with moderate AS. Dimensionless index is 0.37. Compareed to echo from Jan , gradients are decreased.. The aortic valve is abnormal. Aortic valve regurgitation is not visualized. Mild aortic valve sclerosis is present, with no evidence of aortic valve stenosis.  5. The inferior vena cava is normal in size with greater than 50% respiratory variability, suggesting right atrial pressure of 3 mmHg. FINDINGS  Left Ventricle: Left ventricular ejection fraction, by estimation, is 60 to  65%. The left ventricle has normal function. The left ventricle has no regional wall motion abnormalities. The left ventricular internal cavity  size was small. There is mild left ventricular hypertrophy. Left ventricular diastolic parameters are consistent with Grade II diastolic dysfunction (pseudonormalization). Elevated left atrial pressure. Right Ventricle: The right ventricular size is normal. Right vetricular wall thickness was not assessed. Right ventricular systolic function is normal. There is moderately elevated pulmonary artery systolic pressure. The tricuspid regurgitant velocity is  3.27 m/s, and with an assumed right atrial pressure of 10 mmHg, the estimated right ventricular systolic pressure is 80.9 mmHg. Left Atrium: Left atrial size was normal in size. Right Atrium: Right atrial size was normal in size. Pericardium: There is no evidence of pericardial effusion. Mitral Valve: The mitral valve is abnormal. There is mild thickening of the mitral valve leaflet(s). Moderate mitral annular calcification. Mild mitral valve regurgitation. MV peak gradient, 18.5 mmHg. The mean mitral valve gradient is 6.0 mmHg. Tricuspid Valve: The tricuspid valve is normal in structure. Tricuspid valve regurgitation is mild. Aortic Valve: AV is thickened, calcified with restricted motion. Peak and mean gradients through the vlave are 49 and 28 mm respectively consistent with moderate AS. Dimensionless index is 0.37. Compareed to echo from Jan , gradients are decreased. The aortic valve is abnormal. Aortic valve regurgitation is not visualized. Mild aortic valve sclerosis is present, with no evidence of aortic valve stenosis. Aortic valve mean gradient measures 28.3 mmHg. Aortic valve peak gradient measures 48.7 mmHg. Pulmonic Valve: The pulmonic valve was not well visualized. Pulmonic valve regurgitation is not visualized. No evidence of pulmonic stenosis. Aorta: The aortic root is normal in size and structure. Venous: The inferior vena cava is normal in size with greater than 50% respiratory variability, suggesting right atrial pressure of 3 mmHg. IAS/Shunts: The  interatrial septum was not assessed.  LEFT VENTRICLE PLAX 2D LVIDd:         3.33 cm Diastology LVIDs:         2.22 cm LV e' lateral:   5.70 cm/s LV PW:         1.35 cm LV E/e' lateral: 35.1 LV IVS:        1.15 cm LV e' medial:    4.88 cm/s                        LV E/e' medial:  41.0  RIGHT VENTRICLE RV S prime:     16.20 cm/s TAPSE (M-mode): 2.4 cm LEFT ATRIUM             Index       RIGHT ATRIUM           Index LA diam:        3.90 cm 2.13 cm/m  RA Area:     13.20 cm LA Vol (A2C):   31.9 ml 17.41 ml/m RA Volume:   32.40 ml  17.68 ml/m LA Vol (A4C):   38.8 ml 21.18 ml/m LA Biplane Vol: 36.2 ml 19.76 ml/m  AORTIC VALVE AV Vmax:           349.00 cm/s AV Vmean:          251.667 cm/s AV VTI:            0.812 m AV Peak Grad:      48.7 mmHg AV Mean Grad:      28.3 mmHg LVOT Vmax:         133.33 cm/s LVOT Vmean:  93.867 cm/s LVOT VTI:          0.299 m LVOT/AV VTI ratio: 0.37  AORTA Ao Root diam: 3.40 cm MITRAL VALVE                TRICUSPID VALVE MV Area (PHT): 4.15 cm     TR Peak grad:   42.8 mmHg MV Peak grad:  18.5 mmHg    TR Vmax:        327.00 cm/s MV Mean grad:  6.0 mmHg MV Vmax:       2.15 m/s     SHUNTS MV Vmean:      113.0 cm/s   Systemic VTI: 0.30 m MV Decel Time: 183 msec MR Peak grad: 164.9 mmHg MR Mean grad: 88.0 mmHg MR Vmax:      642.00 cm/s MR Vmean:     421.0 cm/s MV E velocity: 200.00 cm/s MV A velocity: 196.00 cm/s MV E/A ratio:  1.02 Dorris Carnes MD Electronically signed by Dorris Carnes MD Signature Date/Time: 04/27/2020/12:16:06 AM    Final     Cardiac Studies   See echo above  Assessment   Principal Problem:   Acute GI bleeding Active Problems:   Diabetes mellitus, type 2 (HCC)   Hyperlipidemia   Anxiety   Essential hypertension   Demand ischemia ([place])   Plan   1. Demand ischemia - troponin elevated to around 1800, no chest pain, said she had "intense sweating" last Sunday evening prior to admission - given her acute GI bleeding, medical therapy is recommended. She is  off statin, however, LFT's are normal, LDL<70. Holding ASA/Plavix due to GIB. 2. Aortic stenosis - at least moderate AS by echo and exam 3. Dyslipidemia - LDL 48, not currently on statin due to apparently elevated LFT's, however, the are normal.  4. GI Bleed -Transferred to [facility] for further work-up. She is at higher risk for endoscopy/colonscopy with sedation, but it is not prohibitive 5. HTN -BP well controlled, continue current medication  Time Spent Directly with Patient:  I have spent a total of 25 minutes with the patient reviewing hospital notes, telemetry, EKGs, labs and examining the patient as well as establishing an assessment and plan that was discussed personally with the patient.  > 50% of time was spent in direct patient care.  Length of Stay:  LOS: 2 days   Pixie Casino, MD, [facility], [place] Director of the [facility] Lipid Disorders &  Cardiovascular Risk Reduction Clinic Diplomate of the American Board of Clinical Lipidology Attending Cardiologist  Direct Dial: [phone]  Fax: [phone]  Website:  [email or web address] 04/28/2020, 7:47 AM

## 2020-04-28 NOTE — Anesthesia Preprocedure Evaluation (Addendum)
Anesthesia Evaluation  Patient identified by MRN, date of birth, ID band Patient awake    Reviewed: Allergy & Precautions, NPO status , Patient's Chart, lab work & pertinent test results, reviewed documented beta blocker date and time   Airway Mallampati: II  TM Distance: >3 FB Neck ROM: Full    Dental  (+) Dental Advisory Given, Lower Dentures, Upper Dentures   Pulmonary COPD,    Pulmonary exam normal breath sounds clear to auscultation       Cardiovascular hypertension, Pt. on home beta blockers + CAD and + Peripheral Vascular Disease  Normal cardiovascular exam Rhythm:Regular Rate:Normal     Neuro/Psych PSYCHIATRIC DISORDERS Anxiety TIACVA, Residual Symptoms    GI/Hepatic Neg liver ROS, hiatal hernia, GERD  Medicated,melena   Endo/Other  diabetes, Type 2, Oral Hypoglycemic Agents  Renal/GU negative Renal ROS     Musculoskeletal  (+) Arthritis ,   Abdominal   Peds  Hematology  (+) Blood dyscrasia (Plavix), anemia ,   Anesthesia Other Findings Day of surgery medications reviewed with the patient.  Reproductive/Obstetrics                            Anesthesia Physical Anesthesia Plan  ASA: III  Anesthesia Plan: MAC   Post-op Pain Management:    Induction: Intravenous  PONV Risk Score and Plan: 2 and Propofol infusion and Treatment may vary due to age or medical condition  Airway Management Planned: Nasal Cannula and Natural Airway  Additional Equipment:   Intra-op Plan:   Post-operative Plan:   Informed Consent: I have reviewed the patients History and Physical, chart, labs and discussed the procedure including the risks, benefits and alternatives for the proposed anesthesia with the patient or authorized representative who has indicated his/her understanding and acceptance.     Dental advisory given  Plan Discussed with: CRNA and Anesthesiologist  Anesthesia Plan  Comments:         Anesthesia Quick Evaluation

## 2020-04-28 NOTE — Anesthesia Postprocedure Evaluation (Signed)
Anesthesia Post Note  Patient: Denise Parrish  Procedure(s) Performed: ESOPHAGOGASTRODUODENOSCOPY (EGD) WITH PROPOFOL (N/A ) POLYPECTOMY     Patient location during evaluation: PACU Anesthesia Type: MAC Level of consciousness: awake and alert, awake and oriented Pain management: pain level controlled Vital Signs Assessment: post-procedure vital signs reviewed and stable Respiratory status: spontaneous breathing, nonlabored ventilation, respiratory function stable and patient connected to nasal cannula oxygen Cardiovascular status: stable and blood pressure returned to baseline Postop Assessment: no apparent nausea or vomiting Anesthetic complications: no   No complications documented.  Last Vitals:  Vitals:   04/28/20 1030 04/28/20 1040  BP: (!) 94/37 (!) 124/56  Pulse: 70 77  Resp: 15 19  Temp:    SpO2: (!) 88% 100%    Last Pain:  Vitals:   04/28/20 1040  TempSrc:   PainSc: 0-No pain                 Catalina Gravel

## 2020-04-28 NOTE — Brief Op Note (Signed)
04/26/2020 - 04/28/2020  11:06 AM  PATIENT:  Denise Parrish  86 y.o. female  PRE-OPERATIVE DIAGNOSIS:  melena, anemia  POST-OPERATIVE DIAGNOSIS:  gastric polyp, no active bleeding  PROCEDURE:  Procedure(s): ESOPHAGOGASTRODUODENOSCOPY (EGD) WITH PROPOFOL (N/A) POLYPECTOMY  SURGEON:  Surgeon(s) and Role:    * Jalani Rominger, MD - Primary  Findings ------------ -EGD showed no evidence of active bleeding.  1 medium sized gastric polyp removed with biopsy forcep.  Recommendations -------------------------- -Start clear liquid diet -Check CT abdomen pelvis with contrast given history of questionable small bowel obstruction/enteritis and to rule out any small or large intestine pathology. -If CT scan negative and if she has ongoing bleeding, she may need a colonoscopy. -Monitor H&H -GI will follow -Findings discussed with patient and patient's daughter over the phone.  Otis Brace MD, [place] 04/28/2020, 11:07 AM  Contact #  [phone]

## 2020-04-28 NOTE — Consult Note (Signed)
Referring Provider: [facility] Primary Care Physician:  [name], MD Primary Gastroenterologist:  Althia Forts  Reason for Consultation:  Melena, symptomatic anemia  HPI: Denise Parrish is a 86 y.o. female with past medical history noted below to include aortic stenosis, CVA (On Plavix, aspirin), GERD, hiatal hernia, IDA, Denise CVA presenting for consultation of melena Denise symptomatic anemia.  Patient states Denise was in her usual state of health until Saturday 8/7 when Denise started experiencing melena.  Denise states Denise is having 3 black, loose stools per day.  On Sunday 8/8, Denise started experiencing weakness, diaphoresis, Denise dyspnea on exertion, thus presented to the ED at [facility].  Denise also reports some nausea Denise "dry heaving" but denies any emesis or hematemesis.  Last BM 8/8.  Patient denies any abdominal pain, unexplained weight loss, early satiety.  Sometimes, Denise feels as if pills get stuck in her esophagus, though Denise denies any dysphagia to solids or liquids.  Has chronically loose stools.  Denies any hematochezia.  Is on Plavix Denise aspirin, last dose Plavix Sunday 8/8.  Denies NSAID use.  Denies any family history of colon cancer or gastrointestinal malignancy.  EGD 03/2012 revealed irregular Z line (bx: reflux esophagitis), small HH, mild gastritis (negative for H pylori). Small bowel bx negative for celiac.  Colonoscopy 04/03/2012: diverticulosis, KG:[id number] for microscopic colitis   Past Medical History:  Diagnosis Date  . Anxiety   . Aortic sclerosis    Long-standing heart murmur  . Carotid stenosis, right 10/16/2019   75% on CTA neck  . Chest pain 2006   +palpitations; minimal CAD at cath in 2005; normal EF; negative stress echo in 09/2008  . Cholelithiasis    on u/s 09/2011  . Degenerative joint disease    Right THA in 1995; cervical discectomy Denise fusion-2001  . Diabetes ([place])   . Diabetes mellitus, type 2 (HCC)    + neuropathy  . Emphysema   .  Gastroesophageal reflux disease   . Hiatal hernia   . Hyperlipidemia   . Hypertension   . IDA (iron deficiency anemia)    labs 09/2011  . Mild cognitive impairment   . Obesity   . Osteoporosis   . Pneumonia   . Sigmoid diverticulosis   . Small bowel lesion    On Given's capsule; Dr Kathleene Hazel 07/2012, no further w/u needed  . Stroke [facility])     Past Surgical History:  Procedure Laterality Date  . ANTERIOR CERVICAL DISCECTOMY  2001   C4-5, allograft, fixation  . BREAST LUMPECTOMY     benign  . CATARACT EXTRACTION W/ INTRAOCULAR LENS IMPLANT  2007   Left  . COLONOSCOPY  2006  . COLONOSCOPY  04/03/12   Dr. Ok Edwards diverticulosis, negative microscopic  colitis   . ENDARTERECTOMY Right 10/21/2019   Procedure: ENDARTERECTOMY CAROTID RIGHT;  Surgeon: Waynetta Sandy, MD;  Location: [place];  Service: Vascular;  Laterality: Right;  . ESOPHAGOGASTRODUODENOSCOPY  04/03/12   Dr. Jennet Maduro hernia, chronic gastritis on bx  . GIVENS CAPSULE STUDY  05/09/2012   RMR: an unclear raised area of small bowel was noted, with features almost  characteristic of very small polyp. This was without villous  blunting or any evidence of active bleeding; yet, the area of  concern appeared to be erythematous. However, this could simply  be a light reflection on a normal variation of the small bowel. REFERRED TO DR. GILLIAM, APPT FOR NOVEMBER 2013.   [name] HIP ARTHROPLASTY Right   . PATCH  ANGIOPLASTY Right 10/21/2019   Procedure: PATCH ANGIOPLASTY USING Rueben Bash BIOLOGIC PATCH;  Surgeon: Waynetta Sandy, MD;  Location: [place];  Service: Vascular;  Laterality: Right;  . TONSILLECTOMY    . TOTAL HIP ARTHROPLASTY  1995   Right    Prior to Admission medications   Medication Sig Start Date End Date Taking? Authorizing Provider  ALPRAZolam Duanne Moron) 0.5 MG tablet Take 1 tablet (0.5 mg total) by mouth 3 (three) times daily. Patient taking differently: Take 0.5 mg by mouth at bedtime.  Occasionally 1 tablet during the day if needed. 12/11/17  Yes Barton Dubois, MD  Ascorbic Acid (VITAMIN C WITH ROSE HIPS) 500 MG tablet Take 500 mg by mouth daily.   Yes [provider]  aspirin 325 MG tablet Take 1 tablet (325 mg total) by mouth daily. 10/22/19  Yes Georgette Shell, MD  atorvastatin (LIPITOR) 40 MG tablet Take 1 tablet (40 mg total) by mouth daily at 6 PM. 10/22/19  Yes Georgette Shell, MD  clopidogrel (PLAVIX) 75 MG tablet Take 1 tablet (75 mg total) by mouth daily. 10/23/19  Yes Georgette Shell, MD  glimepiride (AMARYL) 1 MG tablet Take 1 mg by mouth daily with breakfast.   Yes [provider]  Glycerin-Hypromellose-PEG 400 (VISINE DRY EYE OP) Apply 1 drop to eye 3 (three) times daily as needed (for dry eye relief).    Yes [provider]  meclizine (ANTIVERT) 12.5 MG tablet Take 1 tablet (12.5 mg total) by mouth 3 (three) times daily as needed for dizziness. 12/14/19  Yes Virgel Manifold, MD  metoprolol tartrate (LOPRESSOR) 25 MG tablet Take 25 mg by mouth 2 (two) times daily.  07/27/16  Yes [provider]  multivitamin-lutein (OCUVITE-LUTEIN) CAPS capsule Take 1 capsule by mouth daily.   Yes [provider]  omeprazole (PRILOSEC) 20 MG capsule Take 20 mg by mouth Daily. 08/05/12  Yes [provider]  VITAMIN E PO Take 1 tablet by mouth daily.   Yes [provider]    Scheduled Meds: . insulin aspart  0-6 Units Subcutaneous TID WC  . metoprolol tartrate  5 mg Intravenous Q8H  . pantoprazole (PROTONIX) IV  40 mg Intravenous Q12H   Continuous Infusions: PRN Meds:.acetaminophen **OR** acetaminophen, LORazepam, ondansetron **OR** ondansetron (ZOFRAN) IV  Allergies as of 04/26/2020  . (No Known Allergies)    Family History  Problem Relation Age of Onset  . Cirrhosis Mother        no etoh use  . CAD Father   . Diabetes Mother        + brother  . Heart failure Father   . Hypertension Father        +  brother  . Cancer Brother        lung, age 59  . Uterine cancer Sister   . Colon cancer Neg Hx     Social History   Socioeconomic History  . Marital status: Divorced    Spouse name: Not on file  . Number of children: 4  . Years of education: Not on file  . Highest education level: Not on file  Occupational History  . Occupation: Retired    Comment: [employer]  Tobacco Use  . Smoking status: Never Smoker  . Smokeless tobacco: Never Used  . Tobacco comment: Quit x 50 years  Vaping Use  . Vaping Use: Never used  Substance Denise Sexual Activity  . Alcohol use: Never  . Drug use: Never  . Sexual activity:  Not Currently  Other Topics Concern  . Not on file  Social History Narrative   ** Merged History Encounter **       Social Determinants of Health   Financial Resource Strain:   . Difficulty of Paying Living Expenses:   Food Insecurity:   . Worried About Charity fundraiser in the Last Year:   . Arboriculturist in the Last Year:   Transportation Needs:   . Film/video editor (Medical):   [name] Lack of Transportation (Non-Medical):   Physical Activity:   . Days of Exercise per Week:   . Minutes of Exercise per Session:   Stress:   . Feeling of Stress :   Social Connections:   . Frequency of Communication with Friends Denise Family:   . Frequency of Social Gatherings with Friends Denise Family:   . Attends Religious Services:   . Active Member of Clubs or Organizations:   . Attends Archivist Meetings:   [name] Marital Status:   Intimate Partner Violence:   . Fear of Current or Ex-Partner:   . Emotionally Abused:   [name] Physically Abused:   . Sexually Abused:     Review of Systems: Review of Systems  Constitutional: Positive for malaise/fatigue. Negative for chills, fever Denise weight loss.  HENT: Negative for sore throat.   Eyes: Negative for pain Denise redness.  Respiratory: Positive for shortness of breath. Negative for cough Denise stridor.   Cardiovascular: Negative for  chest pain Denise palpitations.  Gastrointestinal: Positive for diarrhea, melena Denise nausea. Negative for abdominal pain, blood in stool, constipation, heartburn Denise vomiting.  Genitourinary: Negative for flank pain Denise hematuria.  Musculoskeletal: Negative for falls Denise joint pain.  Skin: Negative for itching Denise rash.  Neurological: Negative for seizures Denise loss of consciousness.  Endo/Heme/Allergies: Negative for polydipsia. Bruises/bleeds easily.  Psychiatric/Behavioral: Negative for substance abuse. The patient is not nervous/anxious.     Physical Exam: Vital signs: Vitals:   04/28/20 0422 04/28/20 0723  BP: (!) 104/55 (!) 118/59  Pulse: 68 91  Resp: 18 16  Temp: 98.2 F (36.8 C) 98.1 F (36.7 C)  SpO2: 92% 93%   Last BM Date: 04/26/20  Physical Exam Constitutional:      General: Denise is not in acute distress.    Appearance: Normal appearance.  HENT:     Head: Normocephalic Denise atraumatic.     Nose: Nose normal.     Mouth/Throat:     Mouth: Mucous membranes are moist.     Pharynx: Oropharynx is clear.  Eyes:     General: No scleral icterus.    Extraocular Movements: Extraocular movements intact.     Comments: Mild conjunctival pallor  Cardiovascular:     Rate Denise Rhythm: Normal rate Denise regular rhythm.     Pulses: Normal pulses.     Heart sounds: Murmur heard.   Pulmonary:     Effort: No respiratory distress.     Breath sounds: Normal breath sounds.     Comments: Mild tachypnea with talking Abdominal:     General: Bowel sounds are normal. There is no distension.     Palpations: Abdomen is soft. There is no mass.     Tenderness: There is no abdominal tenderness. There is no guarding or rebound.     Hernia: No hernia is present.  Musculoskeletal:        General: No swelling or tenderness.  Skin:    General: Skin is warm Denise dry.  Neurological:     General: No focal deficit present.     Mental Status: Denise Parrish, Denise Parrish, Denise Parrish.   Psychiatric:        Mood Denise Affect: Mood normal.        Behavior: Behavior normal. Behavior is cooperative.      GI:  Lab Results: Recent Labs    04/26/20 0236 04/26/20 0236 04/26/20 0853 04/26/20 0853 04/26/20 1455 04/26/20 2049 04/27/20 0528  WBC 9.9  --  9.5  --   --   --  9.2  HGB 7.2*   < > 8.8*   < > 8.7* 8.0* 8.5*  HCT 23.4*   < > 28.1*   < > 27.1* 25.4* 26.9*  PLT 231  --  225  --   --   --  206   < > = values in this interval not displayed.   BMET Recent Labs    04/26/20 0236 04/26/20 0853 04/27/20 0528  NA 139 139 140  K 4.0 3.8 4.2  CL 109 109 108  CO2 22 22 25   GLUCOSE 167* 110* 111*  BUN 28* 23 16  CREATININE 0.98 0.81 0.82  CALCIUM 8.3* 8.3* 8.2*   LFT Recent Labs    04/27/20 0528  PROT 5.6*  ALBUMIN 3.1*  AST 24  ALT 15  ALKPHOS 60  BILITOT 1.6*   PT/INR Recent Labs    04/26/20 0236  LABPROT 13.2  INR 1.0     Studies/Results: ECHOCARDIOGRAM COMPLETE  Result Date: 04/27/2020    ECHOCARDIOGRAM REPORT   Patient Name:   Denise Parrish Date of Exam: 04/26/2020 Medical Rec #:  [id number]          Height:       65.0 in Accession #:    [id number]         Weight:       167.0 lb Date of Birth:  27-Apr-1923           BSA:          1.832 m Patient Age:    89 years           BP:           119/58 mmHg Patient Gender: F                  HR:           70 bpm. Exam Location:  [facility] Procedure: 2D Echo Indications:    Abnormal ECG 794.31 / R94.31  History:        Patient has prior history of Echocardiogram examinations, most                 recent 10/17/2019. TIA Denise Stroke; Risk Factors:Diabetes,                 Dyslipidemia, Hypertension Denise Non-Smoker.  Sonographer:    Leavy Cella RDCS (AE) Referring Phys: [id number] COURAGE EMOKPAE IMPRESSIONS  1. Left ventricular ejection fraction, by estimation, is 60 to 65%. The left ventricle has normal function. The left ventricle has no regional wall motion abnormalities. There is mild left ventricular  hypertrophy. Left ventricular diastolic parameters are consistent with Grade II diastolic dysfunction (pseudonormalization). Elevated left atrial pressure.  2. Right ventricular systolic function is normal. The right ventricular size is normal. There is moderately elevated pulmonary artery systolic pressure.  3. The mitral valve is abnormal. Mild mitral valve regurgitation.  4. AV is thickened,  calcified with restricted motion. Peak Denise mean gradients through the vlave are 49 Denise 28 mm respectively consistent with moderate AS. Dimensionless index is 0.37. Compareed to echo from Jan , gradients are decreased.. The aortic valve is abnormal. Aortic valve regurgitation is not visualized. Mild aortic valve sclerosis is present, with no evidence of aortic valve stenosis.  5. The inferior vena cava is normal in size with greater than 50% respiratory variability, suggesting right atrial pressure of 3 mmHg. FINDINGS  Left Ventricle: Left ventricular ejection fraction, by estimation, is 60 to 65%. The left ventricle has normal function. The left ventricle has no regional wall motion abnormalities. The left ventricular internal cavity size was small. There is mild left ventricular hypertrophy. Left ventricular diastolic parameters are consistent with Grade II diastolic dysfunction (pseudonormalization). Elevated left atrial pressure. Right Ventricle: The right ventricular size is normal. Right vetricular wall thickness was not assessed. Right ventricular systolic function is normal. There is moderately elevated pulmonary artery systolic pressure. The tricuspid regurgitant velocity is  3.27 m/s, Denise with an assumed right atrial pressure of 10 mmHg, the estimated right ventricular systolic pressure is 41.9 mmHg. Left Atrium: Left atrial size was normal in size. Right Atrium: Right atrial size was normal in size. Pericardium: There is no evidence of pericardial effusion. Mitral Valve: The mitral valve is abnormal. There is mild  thickening of the mitral valve leaflet(s). Moderate mitral annular calcification. Mild mitral valve regurgitation. MV peak gradient, 18.5 mmHg. The mean mitral valve gradient is 6.0 mmHg. Tricuspid Valve: The tricuspid valve is normal in structure. Tricuspid valve regurgitation is mild. Aortic Valve: AV is thickened, calcified with restricted motion. Peak Denise mean gradients through the vlave are 49 Denise 28 mm respectively consistent with moderate AS. Dimensionless index is 0.37. Compareed to echo from Jan , gradients are decreased. The aortic valve is abnormal. Aortic valve regurgitation is not visualized. Mild aortic valve sclerosis is present, with no evidence of aortic valve stenosis. Aortic valve mean gradient measures 28.3 mmHg. Aortic valve peak gradient measures 48.7 mmHg. Pulmonic Valve: The pulmonic valve was not well visualized. Pulmonic valve regurgitation is not visualized. No evidence of pulmonic stenosis. Aorta: The aortic root is normal in size Denise structure. Venous: The inferior vena cava is normal in size with greater than 50% respiratory variability, suggesting right atrial pressure of 3 mmHg. IAS/Shunts: The interatrial septum was not assessed.  LEFT VENTRICLE PLAX 2D LVIDd:         3.33 cm Diastology LVIDs:         2.22 cm LV e' lateral:   5.70 cm/s LV PW:         1.35 cm LV E/e' lateral: 35.1 LV IVS:        1.15 cm LV e' medial:    4.88 cm/s                        LV E/e' medial:  41.0  RIGHT VENTRICLE RV S prime:     16.20 cm/s TAPSE (M-mode): 2.4 cm LEFT ATRIUM             Index       RIGHT ATRIUM           Index LA diam:        3.90 cm 2.13 cm/m  RA Area:     13.20 cm LA Vol (A2C):   31.9 ml 17.41 ml/m RA Volume:   32.40 ml  17.68 ml/m LA Vol (  A4C):   38.8 ml 21.18 ml/m LA Biplane Vol: 36.2 ml 19.76 ml/m  AORTIC VALVE AV Vmax:           349.00 cm/s AV Vmean:          251.667 cm/s AV VTI:            0.812 m AV Peak Grad:      48.7 mmHg AV Mean Grad:      28.3 mmHg LVOT Vmax:          133.33 cm/s LVOT Vmean:        93.867 cm/s LVOT VTI:          0.299 m LVOT/AV VTI ratio: 0.37  AORTA Ao Root diam: 3.40 cm MITRAL VALVE                TRICUSPID VALVE MV Area (PHT): 4.15 cm     TR Peak grad:   42.8 mmHg MV Peak grad:  18.5 mmHg    TR Vmax:        327.00 cm/s MV Mean grad:  6.0 mmHg MV Vmax:       2.15 m/s     SHUNTS MV Vmean:      113.0 cm/s   Systemic VTI: 0.30 m MV Decel Parrish: 183 msec MR Peak grad: 164.9 mmHg MR Mean grad: 88.0 mmHg MR Vmax:      642.00 cm/s MR Vmean:     421.0 cm/s MV E velocity: 200.00 cm/s MV A velocity: 196.00 cm/s MV E/A ratio:  1.02 Dorris Carnes MD Electronically signed by Dorris Carnes MD Signature Date/Parrish: 04/27/2020/12:16:06 AM    Final     Impression: Upper GI Bleeding in the setting of aspirin Denise Plavix use: melena Denise anemia. Gastritis vs. PUD vs. Esophagitis -Hgb 8.8 today as compared to 8.5 yesterday; was given 1u pRBCs Denise improved from 7.2 on 8/9 -BUN now WNL (16) with Cr 0.82.  BUN 28/ Cr 0.98 on 8/9, consistent with upper GI bleeding.  Bleeding most likely resolved, as pt has not had a BM since 8/9 Denise BUN has normalized.  Aortic stenosis, CAD, CVA: on aspirin Denise Plavix, last dose 8/9  Plan: EGD today with Dr. Alessandra Bevels. I thoroughly discussed the procedure with the patient Denise patient's daughter [name] to include nature, alternatives (PPI/observation), benefits, Denise risks (bleeding, infection, perforation, anesthesia/cardiac Denise pulmonary complications).  Patient Denise patient's daughter both verbalized understanding gave verbal consent to proceed with EGD today.  Continue Protonix IV twice daily.  Continue to monitor H&H with transfusion as needed to maintain hemoglobin greater than 7-8.  [facility] will follow.   LOS: 2 days   Salley Slaughter  PA-C 04/28/2020, 9:04 AM  Contact #  [phone]

## 2020-04-28 NOTE — Op Note (Signed)
[facility] Patient Name: Denise Parrish Procedure Date : 04/28/2020 MRN: [id number] Attending MD: Otis Brace , MD Date of Birth: [birth year] CSN: [id number] Age: 86 Admit Type: Inpatient Procedure:                Upper GI endoscopy Indications:              Melena Providers:                Otis Brace, MD, Clyde Lundborg, RN, Elspeth Cho Tech., Technician, Virgilio Belling. Beckner, CRNA Referring MD:              Medicines:                Sedation Administered by an Anesthesia Professional Complications:            No immediate complications. Estimated Blood Loss:     Estimated blood loss was minimal. Procedure:                Pre-Anesthesia Assessment:                           - Prior to the procedure, a History and Physical                            was performed, and patient medications and                            allergies were reviewed. The patient's tolerance of                            previous anesthesia was also reviewed. The risks                            and benefits of the procedure and the sedation                            options and risks were discussed with the patient.                            All questions were answered, and informed consent                            was obtained. Prior Anticoagulants: The patient                            last took Plavix (clopidogrel) 3 days prior to the                            procedure. ASA Grade Assessment: III - A patient                            with severe systemic disease. After reviewing the  risks and benefits, the patient was deemed in                            satisfactory condition to undergo the procedure.                           After obtaining informed consent, the endoscope was                            passed under direct vision. Throughout the                            procedure, the patient's blood pressure, pulse,  and                            oxygen saturations were monitored continuously. The                            GIF-H190 ([id number]) Olympus gastroscope was                            introduced through the mouth, and advanced to the                            third part of duodenum. The upper GI endoscopy was                            accomplished without difficulty. The patient                            tolerated the procedure well. Scope In: Scope Out: Findings:      The Z-line was irregular and was found 35 cm from the incisors.      A 4 cm hiatal hernia was present.      A few small sessile polyps were found in the cardia and in the gastric       body.      A single 7 mm sessile polyp was found in the gastric body. The polyp was       removed with a piecemeal technique using a cold biopsy forceps.       Resection and retrieval were complete.      The cardia and gastric fundus were normal on retroflexion.      The duodenal bulb, first portion of the duodenum and second portion of       the duodenum were normal. Impression:               - Z-line irregular, 35 cm from the incisors.                           - 4 cm hiatal hernia.                           - A few gastric polyps.                           -  A single gastric polyp. Resected and retrieved.                           - Normal duodenal bulb, first portion of the                            duodenum and second portion of the duodenum. Recommendation:           - Return patient to hospital ward for ongoing care.                           - Clear liquid diet.                           - Continue present medications.                           - Perform a colonoscopy if ongoing bleeding. Procedure Code(s):        --- Professional ---                           [phone], Esophagogastroduodenoscopy, flexible,                            transoral; with biopsy, single or multiple Diagnosis Code(s):        --- Professional ---                            K22.8, Other specified diseases of esophagus                           K44.9, Diaphragmatic hernia without obstruction or                            gangrene                           K31.7, Polyp of stomach and duodenum                           K92.1, Melena (includes Hematochezia) CPT copyright 2019 [employer]. All rights reserved. The codes documented in this report are preliminary and upon coder review may  be revised to meet current compliance requirements. Otis Brace, MD Otis Brace, MD 04/28/2020 10:14:19 AM Number of Addenda: 0

## 2020-04-29 ENCOUNTER — Encounter (HOSPITAL_COMMUNITY): Payer: Self-pay | Admitting: Gastroenterology

## 2020-04-29 ENCOUNTER — Inpatient Hospital Stay (HOSPITAL_COMMUNITY): Payer: Medicare Other

## 2020-04-29 DIAGNOSIS — I10 Essential (primary) hypertension: Secondary | ICD-10-CM | POA: Diagnosis not present

## 2020-04-29 DIAGNOSIS — E119 Type 2 diabetes mellitus without complications: Secondary | ICD-10-CM | POA: Diagnosis not present

## 2020-04-29 DIAGNOSIS — I248 Other forms of acute ischemic heart disease: Secondary | ICD-10-CM | POA: Diagnosis not present

## 2020-04-29 DIAGNOSIS — K922 Gastrointestinal hemorrhage, unspecified: Secondary | ICD-10-CM | POA: Diagnosis not present

## 2020-04-29 DIAGNOSIS — I350 Nonrheumatic aortic (valve) stenosis: Secondary | ICD-10-CM | POA: Diagnosis not present

## 2020-04-29 LAB — CBC WITH DIFFERENTIAL/PLATELET
Abs Immature Granulocytes: 0.03 10*3/uL (ref 0.00–0.07)
Basophils Absolute: 0 10*3/uL (ref 0.0–0.1)
Basophils Relative: 0 %
Eosinophils Absolute: 0.2 10*3/uL (ref 0.0–0.5)
Eosinophils Relative: 3 %
HCT: 26.4 % — ABNORMAL LOW (ref 36.0–46.0)
Hemoglobin: 8.6 g/dL — ABNORMAL LOW (ref 12.0–15.0)
Immature Granulocytes: 0 %
Lymphocytes Relative: 31 %
Lymphs Abs: 2.3 10*3/uL (ref 0.7–4.0)
MCH: 29.1 pg (ref 26.0–34.0)
MCHC: 32.6 g/dL (ref 30.0–36.0)
MCV: 89.2 fL (ref 80.0–100.0)
Monocytes Absolute: 0.9 10*3/uL (ref 0.1–1.0)
Monocytes Relative: 12 %
Neutro Abs: 4 10*3/uL (ref 1.7–7.7)
Neutrophils Relative %: 54 %
Platelets: 235 10*3/uL (ref 150–400)
RBC: 2.96 MIL/uL — ABNORMAL LOW (ref 3.87–5.11)
RDW: 17.6 % — ABNORMAL HIGH (ref 11.5–15.5)
WBC: 7.4 10*3/uL (ref 4.0–10.5)
nRBC: 0 % (ref 0.0–0.2)

## 2020-04-29 LAB — GLUCOSE, CAPILLARY
Glucose-Capillary: 130 mg/dL — ABNORMAL HIGH (ref 70–99)
Glucose-Capillary: 156 mg/dL — ABNORMAL HIGH (ref 70–99)
Glucose-Capillary: 147 mg/dL — ABNORMAL HIGH (ref 70–99)
Glucose-Capillary: 136 mg/dL — ABNORMAL HIGH (ref 70–99)

## 2020-04-29 LAB — BASIC METABOLIC PANEL
Anion gap: 9 (ref 5–15)
BUN: 9 mg/dL (ref 8–23)
CO2: 27 mmol/L (ref 22–32)
Calcium: 8.7 mg/dL — ABNORMAL LOW (ref 8.9–10.3)
Chloride: 103 mmol/L (ref 98–111)
Creatinine, Ser: 1.08 mg/dL — ABNORMAL HIGH (ref 0.44–1.00)
GFR calc Af Amer: 51 mL/min — ABNORMAL LOW (ref >60)
GFR calc non Af Amer: 44 mL/min — ABNORMAL LOW (ref >60)
Glucose, Bld: 112 mg/dL — ABNORMAL HIGH (ref 70–99)
Potassium: 3.8 mmol/L (ref 3.5–5.1)
Sodium: 139 mmol/L (ref 135–145)

## 2020-04-29 LAB — MAGNESIUM: Magnesium: 2 mg/dL (ref 1.7–2.4)

## 2020-04-29 LAB — SURGICAL PATHOLOGY

## 2020-04-29 MED ORDER — ASPIRIN 81 MG PO CHEW
81.0000 mg | CHEWABLE_TABLET | Freq: Every day | ORAL | Status: DC
  Administered 2020-04-30: 81 mg via ORAL
  Filled 2020-04-29: qty 1

## 2020-04-29 MED ORDER — TECHNETIUM TC 99M-LABELED RED BLOOD CELLS IV KIT
24.6000 | PACK | Freq: Once | INTRAVENOUS | Status: AC | PRN
  Administered 2020-04-29: 24.6 via INTRAVENOUS

## 2020-04-29 NOTE — Progress Notes (Signed)
DAILY PROGRESS NOTE   Patient Name: Denise Parrish Date of Encounter: 04/29/2020 Cardiologist: Dorris Carnes, MD  Chief Complaint   No complaints  Patient Profile   Denise Parrish is a 86 y.o. female with a hx of mod-severe AS who is being seen today for the evaluation of chest pain at the request of Dr. Jeannetta Nap  Subjective   Tolerated endoscopy well - one gastric polyp removed. No active bleeding. CT scan demonstrated severe sigmoid diverticulosis. Vitals and labs stable.  Objective   Vitals:   04/28/20 1602 04/28/20 2026 04/29/20 0447 04/29/20 0454  BP: (!) 151/62 123/62 93/73 (!) 109/52  Pulse: 82 87 98 71  Resp: 16 18 20 18   Temp: (!) 97.5 F (36.4 C) 98.6 F (37 C) 98.1 F (36.7 C) 98.4 F (36.9 C)  TempSrc: Oral Oral Oral Oral  SpO2: 94% 94% 92% 97%  Weight:   75.2 kg   Height:        Intake/Output Summary (Last 24 hours) at 04/29/2020 0850 Last data filed at 04/29/2020 0805 Gross per 24 hour  Intake 1420 ml  Output 600 ml  Net 820 ml   Filed Weights   04/27/20 1850 04/28/20 0422 04/29/20 0447  Weight: 75.5 kg 75.5 kg 75.2 kg    Physical Exam   General appearance: alert and no distress Neck: no carotid bruit, no JVD and thyroid not enlarged, symmetric, no tenderness/mass/nodules Lungs: clear to auscultation bilaterally Heart: regular rate and rhythm, S1, S2 normal and systolic murmur: systolic ejection 3/6, crescendo at 2nd right intercostal space Abdomen: soft, non-tender; bowel sounds normal; no masses,  no organomegaly Extremities: extremities normal, atraumatic, no cyanosis or edema Pulses: 2+ and symmetric Skin: Skin color, texture, turgor normal. No rashes or lesions Neurologic: Grossly normal Psych: Pleasant  Inpatient Medications    Scheduled Meds: . insulin aspart  0-6 Units Subcutaneous TID WC  . metoprolol tartrate  5 mg Intravenous Q8H  . pantoprazole (PROTONIX) IV  40 mg Intravenous Q12H    Continuous Infusions:   PRN  Meds: acetaminophen **OR** acetaminophen, LORazepam, ondansetron **OR** ondansetron (ZOFRAN) IV   Labs   Results for orders placed or performed during the hospital encounter of 04/26/20 (from the past 48 hour(s))  CBG monitoring, ED     Status: Abnormal   Collection Time: 04/27/20 11:53 AM  Result Value Ref Range   Glucose-Capillary 132 (H) 70 - 99 mg/dL    Comment: Glucose reference range applies only to samples taken after fasting for at least 8 hours.  Glucose, capillary     Status: Abnormal   Collection Time: 04/27/20  7:34 PM  Result Value Ref Range   Glucose-Capillary 118 (H) 70 - 99 mg/dL    Comment: Glucose reference range applies only to samples taken after fasting for at least 8 hours.  Type and screen [place]     Status: None   Collection Time: 04/27/20  9:24 PM  Result Value Ref Range   ABO/RH(D) AB POS    Antibody Screen NEG    Sample Expiration      04/30/2020,2359 Performed at [facility], [place] [address], [place], [place] [address]   Glucose, capillary     Status: Abnormal   Collection Time: 04/28/20 12:28 AM  Result Value Ref Range   Glucose-Capillary 127 (H) 70 - 99 mg/dL    Comment: Glucose reference range applies only to samples taken after fasting for at least 8 hours.   Comment  1 Notify RN    Comment 2 Document in Chart   Glucose, capillary     Status: Abnormal   Collection Time: 04/28/20  5:57 AM  Result Value Ref Range   Glucose-Capillary 122 (H) 70 - 99 mg/dL    Comment: Glucose reference range applies only to samples taken after fasting for at least 8 hours.   Comment 1 Notify RN    Comment 2 Document in Chart   CBC with Differential/Platelet     Status: Abnormal   Collection Time: 04/28/20  8:10 AM  Result Value Ref Range   WBC 8.5 4.0 - 10.5 K/uL   RBC 3.12 (L) 3.87 - 5.11 MIL/uL   Hemoglobin 8.8 (L) 12.0 - 15.0 g/dL   HCT 27.5 (L) 36 - 46 %   MCV 88.1 80.0 - 100.0 fL   MCH 28.2 26.0 - 34.0 pg   MCHC 32.0 30.0 -  36.0 g/dL   RDW 17.8 (H) 11.5 - 15.5 %   Platelets 243 150 - 400 K/uL   nRBC 0.0 0.0 - 0.2 %   Neutrophils Relative % 63 %   Neutro Abs 5.5 1.7 - 7.7 K/uL   Lymphocytes Relative 25 %   Lymphs Abs 2.1 0.7 - 4.0 K/uL   Monocytes Relative 9 %   Monocytes Absolute 0.7 0 - 1 K/uL   Eosinophils Relative 2 %   Eosinophils Absolute 0.1 0 - 0 K/uL   Basophils Relative 0 %   Basophils Absolute 0.0 0 - 0 K/uL   Immature Granulocytes 1 %   Abs Immature Granulocytes 0.04 0.00 - 0.07 K/uL    Comment: Performed at [facility], [address], [place], [place] [address]  Basic metabolic panel     Status: Abnormal   Collection Time: 04/28/20  8:10 AM  Result Value Ref Range   Sodium 136 135 - 145 mmol/L   Potassium 3.6 3.5 - 5.1 mmol/L   Chloride 101 98 - 111 mmol/L   CO2 24 22 - 32 mmol/L   Glucose, Bld 187 (H) 70 - 99 mg/dL    Comment: Glucose reference range applies only to samples taken after fasting for at least 8 hours.   BUN 12 8 - 23 mg/dL   Creatinine, Ser 1.09 (H) 0.44 - 1.00 mg/dL   Calcium 8.8 (L) 8.9 - 10.3 mg/dL   GFR calc non Af Amer 43 (L) >60 mL/min   GFR calc Af Amer 50 (L) >60 mL/min   Anion gap 11 5 - 15    Comment: Performed at [facility] [address], [place], [place] [address]  Magnesium     Status: None   Collection Time: 04/28/20  8:10 AM  Result Value Ref Range   Magnesium 1.9 1.7 - 2.4 mg/dL    Comment: Performed at [facility] [address] [place], [place] [address]  Glucose, capillary     Status: Abnormal   Collection Time: 04/28/20 11:17 AM  Result Value Ref Range   Glucose-Capillary 114 (H) 70 - 99 mg/dL    Comment: Glucose reference range applies only to samples taken after fasting for at least 8 hours.  Glucose, capillary     Status: Abnormal   Collection Time: 04/28/20 11:50 AM  Result Value Ref Range   Glucose-Capillary 123 (H) 70 - 99 mg/dL    Comment: Glucose reference range applies only to samples taken after fasting for  at least 8 hours.  Glucose, capillary  Status: Abnormal   Collection Time: 04/28/20  4:04 PM  Result Value Ref Range   Glucose-Capillary 119 (H) 70 - 99 mg/dL    Comment: Glucose reference range applies only to samples taken after fasting for at least 8 hours.  Glucose, capillary     Status: Abnormal   Collection Time: 04/28/20  9:14 PM  Result Value Ref Range   Glucose-Capillary 113 (H) 70 - 99 mg/dL    Comment: Glucose reference range applies only to samples taken after fasting for at least 8 hours.   Comment 1 Notify RN    Comment 2 Document in Chart   CBC with Differential/Platelet     Status: Abnormal   Collection Time: 04/29/20  4:34 AM  Result Value Ref Range   WBC 7.4 4.0 - 10.5 K/uL   RBC 2.96 (L) 3.87 - 5.11 MIL/uL   Hemoglobin 8.6 (L) 12.0 - 15.0 g/dL   HCT 26.4 (L) 36 - 46 %   MCV 89.2 80.0 - 100.0 fL   MCH 29.1 26.0 - 34.0 pg   MCHC 32.6 30.0 - 36.0 g/dL   RDW 17.6 (H) 11.5 - 15.5 %   Platelets 235 150 - 400 K/uL   nRBC 0.0 0.0 - 0.2 %   Neutrophils Relative % 54 %   Neutro Abs 4.0 1.7 - 7.7 K/uL   Lymphocytes Relative 31 %   Lymphs Abs 2.3 0.7 - 4.0 K/uL   Monocytes Relative 12 %   Monocytes Absolute 0.9 0 - 1 K/uL   Eosinophils Relative 3 %   Eosinophils Absolute 0.2 0 - 0 K/uL   Basophils Relative 0 %   Basophils Absolute 0.0 0 - 0 K/uL   Immature Granulocytes 0 %   Abs Immature Granulocytes 0.03 0.00 - 0.07 K/uL    Comment: Performed at [facility], [address], [place], [place] [address]  Basic metabolic panel     Status: Abnormal   Collection Time: 04/29/20  4:34 AM  Result Value Ref Range   Sodium 139 135 - 145 mmol/L   Potassium 3.8 3.5 - 5.1 mmol/L   Chloride 103 98 - 111 mmol/L   CO2 27 22 - 32 mmol/L   Glucose, Bld 112 (H) 70 - 99 mg/dL    Comment: Glucose reference range applies only to samples taken after fasting for at least 8 hours.   BUN 9 8 - 23 mg/dL   Creatinine, Ser 1.08 (H) 0.44 - 1.00 mg/dL   Calcium 8.7 (L) 8.9 - 10.3  mg/dL   GFR calc non Af Amer 44 (L) >60 mL/min   GFR calc Af Amer 51 (L) >60 mL/min   Anion gap 9 5 - 15    Comment: Performed at [facility] [address], [place], [place] [address]  Magnesium     Status: None   Collection Time: 04/29/20  4:34 AM  Result Value Ref Range   Magnesium 2.0 1.7 - 2.4 mg/dL    Comment: Performed at [facility] [address] [place], [place] [address]  Glucose, capillary     Status: Abnormal   Collection Time: 04/29/20  6:12 AM  Result Value Ref Range   Glucose-Capillary 130 (H) 70 - 99 mg/dL    Comment: Glucose reference range applies only to samples taken after fasting for at least 8 hours.   Comment 1 Notify RN    Comment 2 Document in Chart     ECG   N/A -  Personally Reviewed  Telemetry   Sinus rhythm - Personally Reviewed  Radiology    CT ABDOMEN PELVIS W CONTRAST  Result Date: 04/28/2020 CLINICAL DATA:  86 year old female with GI bleed. EXAM: CT ABDOMEN AND PELVIS WITH CONTRAST TECHNIQUE: Multidetector CT imaging of the abdomen and pelvis was performed using the standard protocol following bolus administration of intravenous contrast. CONTRAST:  23m OMNIPAQUE IOHEXOL 300 MG/ML  SOLN COMPARISON:  CT abdomen pelvis dated 10/05/2019. FINDINGS: Lower chest: The visualized lung bases are clear. There is calcification of the mitral annulus. No intra-abdominal free air or free fluid. Hepatobiliary: The liver is unremarkable. No intrahepatic biliary ductal dilatation. There is a 2.5 cm noncalcified stone in the gallbladder. No pericholecystic fluid or evidence of acute cholecystitis by CT. Pancreas: Unremarkable. No pancreatic ductal dilatation or surrounding inflammatory changes. Spleen: Normal in size without focal abnormality. Adrenals/Urinary Tract: Mild left adrenal nodularity. The right adrenal gland is unremarkable. There is no hydronephrosis on either side. There is an extrarenal pelvis with mild pelviectasis on the right. There is  symmetric enhancement and excretion of contrast by both kidneys. Moderate bilateral renal parenchyma atrophy with cortical scarring and irregularity. There is a 3 mm nonobstructing left renal upper pole calculus. Multiple bilateral renal cysts as well as several subcentimeter hypodense lesions which are too small to characterize. The visualized ureters and urinary bladder appear unremarkable. Stomach/Bowel: There is severe sigmoid diverticulosis without active inflammatory changes. Evaluation for GI bleed is very limited, almost nondiagnostic due to presence of oral contrast. There is no bowel obstruction or active inflammation. The appendix is normal. Vascular/Lymphatic: Moderate aortoiliac atherosclerotic disease. The IVC is unremarkable. No portal venous gas. There is no adenopathy. Reproductive: The uterus and ovaries are grossly unremarkable. Other: None Musculoskeletal: Osteopenia with degenerative changes of the spine. Total right hip arthroplasty. No acute osseous pathology. IMPRESSION: 1. No acute intra-abdominal or pelvic pathology. 2. Severe sigmoid diverticulosis. No bowel obstruction. Normal appendix. 3. Cholelithiasis. 4. Aortic Atherosclerosis (ICD10-I70.0). Electronically Signed   By: AAnner CreteM.D.   On: 04/28/2020 19:22    Cardiac Studies   See echo above  Assessment   Principal Problem:   Acute GI bleeding Active Problems:   Diabetes mellitus, type 2 ([place]   Hyperlipidemia   Anxiety   Essential hypertension   Demand ischemia ([place]   Plan   1. Demand ischemia - troponin elevated to around 1800, no chest pain, said she had "intense sweating" last Sunday evening prior to admission - given her acute GI bleeding, medical therapy is recommended. She is off statin, however, LFT's are normal, LDL<70. Holding ASA/Plavix due to GIB. Appreciate GI recs as to the timing to restart low dose aspirin.  2. Aortic stenosis - at least moderate AS by echo and  exam  3. Dyslipidemia - LDL 48, not currently on statin due to apparently elevated LFT's, however, the are normal.   4. GI Bleed -No active bleeding, one gastric polyp -CT scan of the abdomen demonstrated "severe sigmoid diverticulosis" - this is the likely source of bleeding, doubt there is a role for colonoscopy or partial colectomy (given age and co-morbidities).  5. HTN -BP well controlled, continue current medication  Time Spent Directly with Patient:  I have spent a total of 25 minutes with the patient reviewing hospital notes, telemetry, EKGs, labs and examining the patient as well as establishing an assessment and plan that was discussed personally with the patient.  > 50% of time was spent in direct patient care.  Length of Stay:  LOS: 3 days   Pixie Casino, MD, [facility], [place] Director of the [facility]  [facility] Diplomate of the American Board of Clinical Lipidology Attending Cardiologist  Direct Dial: [phone]  Fax: [phone]  Website:  [email or web address] 04/29/2020, 8:50 AM

## 2020-04-29 NOTE — Evaluation (Signed)
Physical Therapy Evaluation Patient Details Name: Denise Parrish MRN: [id number] DOB: 25-Nov-1922 Today's Date: 04/29/2020   History of Present Illness  86 y.o. female was admitted after 5 days of weakness and SOB, demand ischemia and divertiulosis causing GI bleed.  Pt was noted to have cholelithiasis, aortic stenosis.  PMHx:  CVA, carotid stenosis, DM, GERD, HTN, facial weakness, atherosclerosis, anxiety, chest pain, heart murmur, emphysema, PNA, osteoporosis, cognitive impairment, R THA  Clinical Impression  Pt was seen for mobility and ck of O2 sats with pregait and post gait time frames.  Pt is low at the start from removing her O2 cannula, and talked with her about not removing any more.  Follow up with her to increase walking distances, to cover stairs for home and to do LE strengthening ex's.  See acutely for these needs.    Follow Up Recommendations Home health PT;Supervision for mobility/OOB    Equipment Recommendations  None recommended by PT    Recommendations for Other Services       Precautions / Restrictions Precautions Precautions: Fall Precaution Comments: O2 via cannula Restrictions Weight Bearing Restrictions: No      Mobility  Bed Mobility Overal bed mobility: Needs Assistance Bed Mobility: Supine to Sit;Sit to Supine     Supine to sit: Min assist Sit to supine: Min assist      Transfers Overall transfer level: Needs assistance Equipment used: Rolling walker (2 wheeled);1 person hand held assist Transfers: Sit to/from Stand Sit to Stand: Min guard Stand pivot transfers: Min guard          Ambulation/Gait Ambulation/Gait assistance: Min guard Gait Distance (Feet): 35 Feet Assistive device: Rolling walker (2 wheeled) Gait Pattern/deviations: Decreased weight shift to left;Step-through pattern;Wide base of support Gait velocity: decreased Gait velocity interpretation: <1.31 ft/sec, indicative of household ambulator General Gait Details: 82%  sat on room air, increased to 3L and then was 88% after walk  Stairs            Wheelchair Mobility    Modified Rankin (Stroke Patients Only)       Balance                                             Pertinent Vitals/Pain Pain Assessment: No/denies pain    Home Living Family/patient expects to be discharged to:: Private residence Living Arrangements: Alone Available Help at Discharge: Family;Available 24 hours/day Type of Home: House Home Access: Stairs to enter Entrance Stairs-Rails: Right;Left;Can reach both Entrance Stairs-Number of Steps: 4-5 Home Layout: One level Home Equipment: Other (comment);Walker - 2 wheels;Cane - quad;Wheelchair - manual;Bedside commode      Prior Function Level of Independence: Independent with assistive device(s)         Comments: furniture walking and uses stick outside of the house     Hand Dominance   Dominant Hand: Right    Extremity/Trunk Assessment   Upper Extremity Assessment Upper Extremity Assessment: Generalized weakness    Lower Extremity Assessment Lower Extremity Assessment: Generalized weakness    Cervical / Trunk Assessment Cervical / Trunk Assessment: Kyphotic  Communication   Communication: HOH  Cognition Arousal/Alertness: Awake/alert Behavior During Therapy: WFL for tasks assessed/performed Overall Cognitive Status: Within Functional Limits for tasks assessed  General Comments General comments (skin integrity, edema, etc.): sats were down due to having O2 off in room, but with replacement was back to 93%.  After walk was down to 88% and encouraged her not to get OOB to BR without O2    Exercises     Assessment/Plan    PT Assessment Patent does not need any further PT services  PT Problem List         PT Treatment Interventions      PT Goals (Current goals can be found in the Care Plan section)  Acute Rehab PT  Goals Patient Stated Goal: return home with family to assist PT Goal Formulation: With patient Time For Goal Achievement: 05/13/20 Potential to Achieve Goals: Good    Frequency     Barriers to discharge        Co-evaluation               AM-PAC PT "6 Clicks" Mobility  Outcome Measure Help needed turning from your back to your side while in a flat bed without using bedrails?: None Help needed moving from lying on your back to sitting on the side of a flat bed without using bedrails?: A Little Help needed moving to and from a bed to a chair (including a wheelchair)?: A Little Help needed standing up from a chair using your arms (e.g., wheelchair or bedside chair)?: A Little Help needed to walk in hospital room?: A Little   6 Click Score: 16    End of Session Equipment Utilized During Treatment: Gait belt;Oxygen Activity Tolerance: Patient limited by fatigue;Treatment limited secondary to medical complications (Comment) Patient left: in chair Nurse Communication: Mobility status PT Visit Diagnosis: Unsteadiness on feet (R26.81);Other abnormalities of gait and mobility (R26.89);Muscle weakness (generalized) (M62.81)    Time: 7048-8891 PT Time Calculation (min) (ACUTE ONLY): 32 min   Charges:   PT Evaluation $PT Eval Moderate Complexity: 1 Mod PT Treatments $Gait Training: 8-22 mins       Ramond Dial 04/29/2020, 9:19 PM  Mee Hives, PT MS Acute Rehab Dept. Number: [place] and [place]

## 2020-04-29 NOTE — Progress Notes (Signed)
Patient ID: Denise Parrish, female   DOB: 06-12-1923, 86 y.o.   MRN: [id number]  PROGRESS NOTE    Denise Parrish  KKX:[id number] DOB: Jan 09, 1923 DOA: 04/26/2020 PCP: [name], MD   Brief Narrative:  86 year old female with history of palpitations, minimal CAD on cath in 2005, moderate to severe AS on echo in 09/27/2019 with normal LVEF 60 to 65% and grade 1 diastolic dysfunction, hypertension, diabetes mellitus type 2, hyperlipidemia, unspecified CVA in January 2021 and TIA in July 2021 for which she was on aspirin and Plavix, status post CEA on 10/21/2019 was admitted on 04/26/2020 with weakness and melena.  Hemoglobin was 7.2 on admission for which she required 1 unit of packed red cell transfusion.  GI and cardiology were consulted.  She was transferred to [facility] for further GI work-up as anesthesia at [facility] did not feel comfortable with providing anesthesia at that facility.  Assessment & Plan:  Acute blood loss anemia Probable upper GI bleeding -Patient presented with hemoglobin of 7.2, hemoglobin was 11.9 on 04/07/2020.  Status post 1 unit of packed red cell transfusion.  Hemoglobin 8.6 today.  Monitor H&H. -Currently on Protonix 40 mg IV every 12 hours.   -GI following.  Status post EGD on 04/28/2020 which showed no evidence of active bleeding; 1 medium size gastric polyp removed with biopsy forceps.  CT of the abdomen and pelvis done subsequently showed severe sigmoid diverticulosis and cholelithiasis but no acute intra-abdominal or pelvic pathology.  Follow further GI recommendations regarding need for colonoscopy.  Unspecified CVA in January 2021 and TIA in July 2021 Recent right CEA on 2-21 for symptomatic right carotid artery stenosis -Patient was on aspirin 325 mg and Plavix 75 mg prior to presentation.  Both of these are on hold. -Resume Lipitor once patient has been resumed on diet.  Elevated troponin -Most likely due to demand ischemia.   Cardiology following and recommending medical management.  Echo with preserved EF of 60 to 65% with grade 2 diastolic dysfunction with moderately elevated pulmonary artery systolic pressure.  Aortic stenosis -Moderate as per cardiology documentation.  Chronic diastolic heart failure -Compensated.  Echo as above.  Strict input output.  Daily weights.  Cardiology following.  Hypertension -Oral meds on hold.  Monitor blood pressure.  Blood pressure on the lower side.  Currently on IV metoprolol. Diabetes mellitus type 2 -A1c 7.  Amaryl on hold -Continue CBGs with SSI  Anxiety disorder -Lorazepam as needed  Generalized conditioning -will need PT eval.  Palliative care following.  Patient remains full code.   DVT prophylaxis: SCDs Code Status: Full Family Communication: Patient at bedside Disposition Plan: Status is: Inpatient  Remains inpatient appropriate because:IV treatments appropriate due to intensity of illness or inability to take PO   Dispo:  Patient From: Home  Planned Disposition: [place]  Expected discharge date: 04/30/2020-05/01/2020  Medically stable for discharge: No    Consultants: GI/cardiology/palliative care  Procedures:  Echo 1. Left ventricular ejection fraction, by estimation, is 60 to 65%. The  left ventricle has normal function. The left ventricle has no regional  wall motion abnormalities. There is mild left ventricular hypertrophy.  Left ventricular diastolic parameters  are consistent with Grade II diastolic dysfunction (pseudonormalization).  Elevated left atrial pressure.  2. Right ventricular systolic function is normal. The right ventricular  size is normal. There is moderately elevated pulmonary artery systolic  pressure.  3. The mitral valve is abnormal. Mild mitral valve regurgitation.  4.  AV is thickened, calcified with restricted motion. Peak and mean  gradients through the vlave are 49 and 28 mm respectively  consistent with  moderate AS. Dimensionless index is 0.37. Compareed to echo from Jan ,  gradients are decreased.. The aortic  valve is abnormal. Aortic valve regurgitation is not visualized. Mild  aortic valve sclerosis is present, with no evidence of aortic valve  stenosis.  5. The inferior vena cava is normal in size with greater than 50%  respiratory variability, suggesting right atrial pressure of 3 mmHg.   EGD on 04/28/2020 Findings:      The Z-line was irregular and was found 35 cm from the incisors.      A 4 cm hiatal hernia was present.      A few small sessile polyps were found in the cardia and in the gastric       body.      A single 7 mm sessile polyp was found in the gastric body. The polyp was       removed with a piecemeal technique using a cold biopsy forceps.       Resection and retrieval were complete.      The cardia and gastric fundus were normal on retroflexion.      The duodenal bulb, first portion of the duodenum and second portion of       the duodenum were normal. Impression:               - Z-line irregular, 35 cm from the incisors.                           - 4 cm hiatal hernia.                           - A few gastric polyps.                           - A single gastric polyp. Resected and retrieved.                           - Normal duodenal bulb, first portion of the                            duodenum and second portion of the duodenum. Recommendation:           - Return patient to hospital ward for ongoing care.                           - Clear liquid diet.                           - Continue present medications.                           - Perform a colonoscopy if ongoing bleeding.  Antimicrobials: None   Subjective: Patient seen and examined at bedside.  Denies worsening abdominal pain.  No overnight fever, shortness of breath, chest pain, black or bloody bowel movements reported. Objective: Vitals:   04/28/20 1602 04/28/20 2026 04/29/20  0447 04/29/20 0454  BP: (!) 151/62 123/62 93/73 (!) 109/52  Pulse: 82 87 98 71  Resp: 16 18 20 18   Temp: (!) 97.5 F (36.4 C) 98.6 F (37 C) 98.1 F (36.7 C) 98.4 F (36.9 C)  TempSrc: Oral Oral Oral Oral  SpO2: 94% 94% 92% 97%  Weight:   75.2 kg   Height:        Intake/Output Summary (Last 24 hours) at 04/29/2020 0718 Last data filed at 04/29/2020 0448 Gross per 24 hour  Intake 1360 ml  Output 600 ml  Net 760 ml   Filed Weights   04/27/20 1850 04/28/20 0422 04/29/20 0447  Weight: 75.5 kg 75.5 kg 75.2 kg    Examination:  General exam: No acute distress.  Slightly hard of hearing.  Elderly female lying in bed.  Respiratory system: Bilateral decreased breath sounds at bases, no wheezing cardiovascular system: Rate controlled, S1-S2 heard Gastrointestinal system: Abdomen is nondistended, soft and nontender.  Bowel sounds are heard  extremities: Mild lower extremity edema present.  No clubbing.    Data Reviewed: I have personally reviewed following labs and imaging studies  CBC: Recent Labs  Lab 04/26/20 0236 04/26/20 0236 04/26/20 0853 04/26/20 0853 04/26/20 1455 04/26/20 2049 04/27/20 0528 04/28/20 0810 04/29/20 0434  WBC 9.9  --  9.5  --   --   --  9.2 8.5 7.4  NEUTROABS 7.4  --   --   --   --   --   --  5.5 4.0  HGB 7.2*   < > 8.8*   < > 8.7* 8.0* 8.5* 8.8* 8.6*  HCT 23.4*   < > 28.1*   < > 27.1* 25.4* 26.9* 27.5* 26.4*  MCV 91.8  --  90.6  --   --   --  90.3 88.1 89.2  PLT 231  --  225  --   --   --  206 243 235   < > = values in this interval not displayed.   Basic Metabolic Panel: Recent Labs  Lab 04/26/20 0236 04/26/20 0853 04/27/20 0528 04/28/20 0810 04/29/20 0434  NA 139 139 140 136 139  K 4.0 3.8 4.2 3.6 3.8  CL 109 109 108 101 103  CO2 22 22 25 24 27   GLUCOSE 167* 110* 111* 187* 112*  BUN 28* 23 16 12 9   CREATININE 0.98 0.81 0.82 1.09* 1.08*  CALCIUM 8.3* 8.3* 8.2* 8.8* 8.7*  MG  --   --   --  1.9 2.0   GFR: Estimated Creatinine  Clearance: 31.6 mL/min (A) (by C-G formula based on SCr of 1.08 mg/dL (H)). Liver Function Tests: Recent Labs  Lab 04/26/20 0236 04/26/20 0853 04/27/20 0528  AST 21 25 24   ALT 16 14 15   ALKPHOS 57 57 60  BILITOT 0.7 2.7* 1.6*  PROT 6.0* 5.8* 5.6*  ALBUMIN 3.3* 3.2* 3.1*   No results for input(s): LIPASE, AMYLASE in the last 168 hours. No results for input(s): AMMONIA in the last 168 hours. Coagulation Profile: Recent Labs  Lab 04/26/20 0236  INR 1.0   Cardiac Enzymes: No results for input(s): CKTOTAL, CKMB, CKMBINDEX, TROPONINI in the last 168 hours. BNP (last 3 results) No results for input(s): PROBNP in the last 8760 hours. HbA1C: No results for input(s): HGBA1C in the last 72 hours. CBG: Recent Labs  Lab 04/28/20 1117 04/28/20 1150 04/28/20 1604 04/28/20 2114 04/29/20 0612  GLUCAP 114* 123* 119* 113* 130*   Lipid Profile: No results for input(s): CHOL, HDL, LDLCALC, TRIG, CHOLHDL, LDLDIRECT in the last 72 hours. Thyroid Function Tests: No results for  input(s): TSH, T4TOTAL, FREET4, T3FREE, THYROIDAB in the last 72 hours. Anemia Panel: No results for input(s): VITAMINB12, FOLATE, FERRITIN, TIBC, IRON, RETICCTPCT in the last 72 hours. Sepsis Labs: No results for input(s): PROCALCITON, LATICACIDVEN in the last 168 hours.  Recent Results (from the past 240 hour(s))  SARS Coronavirus 2 by RT PCR (hospital order, performed in [facility] hospital lab) Nasopharyngeal Nasopharyngeal Swab     Status: None   Collection Time: 04/26/20  1:54 AM   Specimen: Nasopharyngeal Swab  Result Value Ref Range Status   SARS Coronavirus 2 NEGATIVE NEGATIVE Final    Comment: (NOTE) SARS-CoV-2 target nucleic acids are NOT DETECTED.  The SARS-CoV-2 RNA is generally detectable in upper and lower respiratory specimens during the acute phase of infection. The lowest concentration of SARS-CoV-2 viral copies this assay can detect is 250 copies / mL. A negative result does not preclude  SARS-CoV-2 infection and should not be used as the sole basis for treatment or other patient management decisions.  A negative result may occur with improper specimen collection / handling, submission of specimen other than nasopharyngeal swab, presence of viral mutation(s) within the areas targeted by this assay, and inadequate number of viral copies (<250 copies / mL). A negative result must be combined with clinical observations, patient history, and epidemiological information.  Fact Sheet for Patients:   StrictlyIdeas.no  Fact Sheet for Healthcare Providers: BankingDealers.co.za  This test is not yet approved or  cleared by the [place] FDA and has been authorized for detection and/or diagnosis of SARS-CoV-2 by FDA under an Emergency Use Authorization (EUA).  This EUA will remain in effect (meaning this test can be used) for the duration of the COVID-19 declaration under Section 564(b)(1) of the Act, 21 U.S.C. section 360bbb-3(b)(1), unless the authorization is terminated or revoked sooner.  Performed at [facility], [address], [place], [place] [address]          Radiology Studies: CT ABDOMEN PELVIS W CONTRAST  Result Date: 04/28/2020 CLINICAL DATA:  86 year old female with GI bleed. EXAM: CT ABDOMEN AND PELVIS WITH CONTRAST TECHNIQUE: Multidetector CT imaging of the abdomen and pelvis was performed using the standard protocol following bolus administration of intravenous contrast. CONTRAST:  37m OMNIPAQUE IOHEXOL 300 MG/ML  SOLN COMPARISON:  CT abdomen pelvis dated 10/05/2019. FINDINGS: Lower chest: The visualized lung bases are clear. There is calcification of the mitral annulus. No intra-abdominal free air or free fluid. Hepatobiliary: The liver is unremarkable. No intrahepatic biliary ductal dilatation. There is a 2.5 cm noncalcified stone in the gallbladder. No pericholecystic fluid or evidence of acute cholecystitis  by CT. Pancreas: Unremarkable. No pancreatic ductal dilatation or surrounding inflammatory changes. Spleen: Normal in size without focal abnormality. Adrenals/Urinary Tract: Mild left adrenal nodularity. The right adrenal gland is unremarkable. There is no hydronephrosis on either side. There is an extrarenal pelvis with mild pelviectasis on the right. There is symmetric enhancement and excretion of contrast by both kidneys. Moderate bilateral renal parenchyma atrophy with cortical scarring and irregularity. There is a 3 mm nonobstructing left renal upper pole calculus. Multiple bilateral renal cysts as well as several subcentimeter hypodense lesions which are too small to characterize. The visualized ureters and urinary bladder appear unremarkable. Stomach/Bowel: There is severe sigmoid diverticulosis without active inflammatory changes. Evaluation for GI bleed is very limited, almost nondiagnostic due to presence of oral contrast. There is no bowel obstruction or active inflammation. The appendix is normal. Vascular/Lymphatic: Moderate aortoiliac atherosclerotic disease. The IVC is unremarkable.  No portal venous gas. There is no adenopathy. Reproductive: The uterus and ovaries are grossly unremarkable. Other: None Musculoskeletal: Osteopenia with degenerative changes of the spine. Total right hip arthroplasty. No acute osseous pathology. IMPRESSION: 1. No acute intra-abdominal or pelvic pathology. 2. Severe sigmoid diverticulosis. No bowel obstruction. Normal appendix. 3. Cholelithiasis. 4. Aortic Atherosclerosis (ICD10-I70.0). Electronically Signed   By: Anner Crete M.D.   On: 04/28/2020 19:22        Scheduled Meds: . insulin aspart  0-6 Units Subcutaneous TID WC  . metoprolol tartrate  5 mg Intravenous Q8H  . pantoprazole (PROTONIX) IV  40 mg Intravenous Q12H   Continuous Infusions:        Aline August, MD [facility] 04/29/2020, 7:18 AM \

## 2020-04-29 NOTE — Progress Notes (Signed)
[facility] Gastroenterology Progress Note  Denise Parrish 86 y.o. Aug 19, 1923  CC: GI bleed   Subjective: Patient seen and examined at bedside.  She is sitting comfortably in the chair without any acute distress.  Had one episode of melena this morning.  Denies any abdominal pain, nausea or vomiting.  ROS : Afebrile.  Positive for mild shortness of breath.   Objective: Vital signs in last 24 hours: Vitals:   04/29/20 0447 04/29/20 0454  BP: 93/73 (!) 109/52  Pulse: 98 71  Resp: 20 18  Temp: 98.1 F (36.7 C) 98.4 F (36.9 C)  SpO2: 92% 97%    Physical Exam:  General:   Elderly patient.  Not in acute distress  Head:  Normocephalic, without obvious abnormality, atraumatic  Eyes:  , EOM's intact,   Lungs:    No visible respiratory distress noted  Heart:  Regular rate and rhythm, murmur noted  Abdomen:   Soft, non-tender, nondistended, bowel sounds present.  No peritoneal signs  Extremities: Extremities normal, atraumatic, no  edema       Lab Results: Recent Labs    04/28/20 0810 04/29/20 0434  NA 136 139  K 3.6 3.8  CL 101 103  CO2 24 27  GLUCOSE 187* 112*  BUN 12 9  CREATININE 1.09* 1.08*  CALCIUM 8.8* 8.7*  MG 1.9 2.0   Recent Labs    04/27/20 0528  AST 24  ALT 15  ALKPHOS 60  BILITOT 1.6*  PROT 5.6*  ALBUMIN 3.1*   Recent Labs    04/28/20 0810 04/29/20 0434  WBC 8.5 7.4  NEUTROABS 5.5 4.0  HGB 8.8* 8.6*  HCT 27.5* 26.4*  MCV 88.1 89.2  PLT 243 235   No results for input(s): LABPROT, INR in the last 72 hours.    Assessment/Plan: -GI bleed with melanotic stool.  EGD 04/28/2020  negative for active bleeding .  CT abdomen pelvis showed severe diverticulosis.  Patient with ongoing melena.  Hemoglobin stable. -History of CVA.  Aspirin and Plavix on hold -Demand ischemia.  Recommendations ----------------------- -Patient with ongoing melena.  CT scan showed severe diverticulosis.  Discussed with patient and daughter.  Patient would be at high  risk for colonoscopy related complication given severe diverticulosis, advanced age and other comorbidities. -Patient will need antiplatelet agent for her history of CVA.  Recommend getting GI bleeding scan to rule out active bleeding. -If bleeding scan negative, okay to resume antiplatelet agent from GI standpoint. -Monitor H&H.  GI will follow   Otis Brace MD, [place] [facility] 04/29/2020, 9:38 AM  Contact #  [phone]

## 2020-04-30 ENCOUNTER — Inpatient Hospital Stay (HOSPITAL_COMMUNITY): Payer: Medicare Other

## 2020-04-30 ENCOUNTER — Encounter (HOSPITAL_COMMUNITY)
Admission: EM | Disposition: A | Discharge status: Home Health | Payer: Self-pay | Source: Home / Self Care | Attending: Internal Medicine

## 2020-04-30 DIAGNOSIS — K922 Gastrointestinal hemorrhage, unspecified: Secondary | ICD-10-CM | POA: Diagnosis not present

## 2020-04-30 DIAGNOSIS — S82832A Other fracture of upper and lower end of left fibula, initial encounter for closed fracture: Secondary | ICD-10-CM

## 2020-04-30 DIAGNOSIS — R55 Syncope and collapse: Secondary | ICD-10-CM | POA: Diagnosis not present

## 2020-04-30 DIAGNOSIS — I248 Other forms of acute ischemic heart disease: Secondary | ICD-10-CM | POA: Diagnosis not present

## 2020-04-30 DIAGNOSIS — E119 Type 2 diabetes mellitus without complications: Secondary | ICD-10-CM | POA: Diagnosis not present

## 2020-04-30 DIAGNOSIS — I10 Essential (primary) hypertension: Secondary | ICD-10-CM | POA: Diagnosis not present

## 2020-04-30 HISTORY — PX: GIVENS CAPSULE STUDY: SHX5432

## 2020-04-30 LAB — CBC WITH DIFFERENTIAL/PLATELET
Abs Immature Granulocytes: 0.03 10*3/uL (ref 0.00–0.07)
Basophils Absolute: 0 10*3/uL (ref 0.0–0.1)
Basophils Relative: 0 %
Eosinophils Absolute: 0.3 10*3/uL (ref 0.0–0.5)
Eosinophils Relative: 4 %
HCT: 25.3 % — ABNORMAL LOW (ref 36.0–46.0)
Hemoglobin: 8 g/dL — ABNORMAL LOW (ref 12.0–15.0)
Immature Granulocytes: 0 %
Lymphocytes Relative: 30 %
Lymphs Abs: 2.2 10*3/uL (ref 0.7–4.0)
MCH: 28.6 pg (ref 26.0–34.0)
MCHC: 31.6 g/dL (ref 30.0–36.0)
MCV: 90.4 fL (ref 80.0–100.0)
Monocytes Absolute: 0.8 10*3/uL (ref 0.1–1.0)
Monocytes Relative: 11 %
Neutro Abs: 3.9 10*3/uL (ref 1.7–7.7)
Neutrophils Relative %: 55 %
Platelets: 234 10*3/uL (ref 150–400)
RBC: 2.8 MIL/uL — ABNORMAL LOW (ref 3.87–5.11)
RDW: 17.4 % — ABNORMAL HIGH (ref 11.5–15.5)
WBC: 7.2 10*3/uL (ref 4.0–10.5)
nRBC: 0 % (ref 0.0–0.2)

## 2020-04-30 LAB — TYPE AND SCREEN
ABO/RH(D): AB POS
Antibody Screen: NEGATIVE

## 2020-04-30 LAB — BASIC METABOLIC PANEL
Anion gap: 8 (ref 5–15)
BUN: 9 mg/dL (ref 8–23)
CO2: 29 mmol/L (ref 22–32)
Calcium: 8.4 mg/dL — ABNORMAL LOW (ref 8.9–10.3)
Chloride: 103 mmol/L (ref 98–111)
Creatinine, Ser: 1.08 mg/dL — ABNORMAL HIGH (ref 0.44–1.00)
GFR calc Af Amer: 51 mL/min — ABNORMAL LOW (ref >60)
GFR calc non Af Amer: 44 mL/min — ABNORMAL LOW (ref >60)
Glucose, Bld: 120 mg/dL — ABNORMAL HIGH (ref 70–99)
Potassium: 3.5 mmol/L (ref 3.5–5.1)
Sodium: 140 mmol/L (ref 135–145)

## 2020-04-30 LAB — GLUCOSE, CAPILLARY
Glucose-Capillary: 129 mg/dL — ABNORMAL HIGH (ref 70–99)
Glucose-Capillary: 129 mg/dL — ABNORMAL HIGH (ref 70–99)
Glucose-Capillary: 148 mg/dL — ABNORMAL HIGH (ref 70–99)
Glucose-Capillary: 125 mg/dL — ABNORMAL HIGH (ref 70–99)
Glucose-Capillary: 182 mg/dL — ABNORMAL HIGH (ref 70–99)

## 2020-04-30 LAB — HEMOGLOBIN AND HEMATOCRIT, BLOOD
HCT: 31 % — ABNORMAL LOW (ref 36.0–46.0)
Hemoglobin: 9.8 g/dL — ABNORMAL LOW (ref 12.0–15.0)

## 2020-04-30 LAB — MAGNESIUM: Magnesium: 2 mg/dL (ref 1.7–2.4)

## 2020-04-30 LAB — PREPARE RBC (CROSSMATCH)

## 2020-04-30 SURGERY — IMAGING PROCEDURE, GI TRACT, INTRALUMINAL, VIA CAPSULE

## 2020-04-30 MED ORDER — MAGNESIUM CITRATE PO SOLN
1.0000 | Freq: Once | ORAL | Status: AC
  Administered 2020-04-30: 1 via ORAL
  Filled 2020-04-30: qty 296

## 2020-04-30 MED ORDER — FENTANYL CITRATE (PF) 100 MCG/2ML IJ SOLN
12.5000 ug | Freq: Once | INTRAMUSCULAR | Status: AC | PRN
  Administered 2020-04-30: 12.5 ug via INTRAVENOUS
  Filled 2020-04-30: qty 2

## 2020-04-30 MED ORDER — HYDROCODONE-ACETAMINOPHEN 5-325 MG PO TABS
0.5000 | ORAL_TABLET | ORAL | Status: AC | PRN
  Administered 2020-04-30 (×2): 0.5 via ORAL
  Filled 2020-04-30 (×2): qty 1

## 2020-04-30 MED ORDER — SODIUM CHLORIDE 0.9% IV SOLUTION: Freq: Once | INTRAVENOUS | Status: AC

## 2020-04-30 MED ORDER — SODIUM CHLORIDE 0.9 % IV BOLUS
250.0000 mL | Freq: Once | INTRAVENOUS | Status: AC
  Administered 2020-04-30: 250 mL via INTRAVENOUS

## 2020-04-30 NOTE — Consult Note (Signed)
Reason for Consult:Left fibula fx Referring Physician: [name] Parrish is an 86 y.o. female.  HPI: Denise Parrish has been hospitalized for GIB and cardiac issues. While coming back from the bathroom this morning she had a syncopal event and had an assisted fall. When she regained consciousness she c/o left ankle pain. X-ray showed a distal fibula fx and orthopedic surgery was consulted.  Past Medical History:  Diagnosis Date  . Anxiety   . Aortic sclerosis    Long-standing heart murmur  . Carotid stenosis, right 10/16/2019   75% on CTA neck  . Chest pain 2006   +palpitations; minimal CAD at cath in 2005; normal EF; negative stress echo in 09/2008  . Cholelithiasis    on u/s 09/2011  . Degenerative joint disease    Right THA in 1995; cervical discectomy and fusion-2001  . Diabetes ([facility])   . Diabetes mellitus, type 2 (HCC)    + neuropathy  . Emphysema   . Gastroesophageal reflux disease   . Hiatal hernia   . Hyperlipidemia   . Hypertension   . IDA (iron deficiency anemia)    labs 09/2011  . Mild cognitive impairment   . Obesity   . Osteoporosis   . Pneumonia   . Sigmoid diverticulosis   . Small bowel lesion    On Given's capsule; Dr Denise Parrish 07/2012, no further w/u needed  . Stroke Denise Parrish)     Past Surgical History:  Procedure Laterality Date  . ANTERIOR CERVICAL DISCECTOMY  2001   C4-5, allograft, fixation  . BREAST LUMPECTOMY     benign  . CATARACT EXTRACTION W/ INTRAOCULAR LENS IMPLANT  2007   Left  . COLONOSCOPY  2006  . COLONOSCOPY  04/03/12   Dr. Ok Edwards diverticulosis, negative microscopic  colitis   . ENDARTERECTOMY Right 10/21/2019   Procedure: ENDARTERECTOMY CAROTID RIGHT;  Surgeon: Denise Sandy, Denise Parrish;  Location: [facility];  Service: Vascular;  Laterality: Right;  . ESOPHAGOGASTRODUODENOSCOPY  04/03/12   Dr. Jennet Parrish hernia, chronic gastritis on bx  . ESOPHAGOGASTRODUODENOSCOPY (EGD) WITH PROPOFOL N/A 04/28/2020   Procedure:  ESOPHAGOGASTRODUODENOSCOPY (EGD) WITH PROPOFOL;  Surgeon: Denise Brace, Denise Parrish;  Location: [facility];  Service: Gastroenterology;  Laterality: N/A;  . GIVENS CAPSULE STUDY  05/09/2012   RMR: an unclear raised area of small bowel was noted, with features almost  characteristic of very small polyp. This was without villous  blunting or any evidence of active bleeding; yet, the area of  concern appeared to be erythematous. However, this could simply  be a light reflection on a normal variation of the small bowel. REFERRED TO Denise Parrish, APPT FOR NOVEMBER 2013.   [name] HIP ARTHROPLASTY Right   . PATCH ANGIOPLASTY Right 10/21/2019   Procedure: PATCH ANGIOPLASTY USING Rueben Bash BIOLOGIC PATCH;  Surgeon: Denise Sandy, Denise Parrish;  Location: [facility];  Service: Vascular;  Laterality: Right;  . POLYPECTOMY  04/28/2020   Procedure: POLYPECTOMY;  Surgeon: Denise Brace, Denise Parrish;  Location: [facility];  Service: Gastroenterology;;  . TONSILLECTOMY    . TOTAL HIP ARTHROPLASTY  1995   Right    Family History  Problem Relation Age of Onset  . Cirrhosis Mother        no etoh use  . CAD Father   . Diabetes Mother        + brother  . Heart failure Father   . Hypertension Father        + brother  . Cancer Brother  lung, age 93  . Uterine cancer Sister   . Colon cancer Neg Hx     Social History:  reports that she has never smoked. She has never used smokeless tobacco. She reports that she does not drink alcohol and does not use drugs.  Allergies: No Known Allergies  Medications: I have reviewed the patient's current medications.  Results for orders placed or performed during the Parrish encounter of 04/26/20 (from the past 48 hour(s))  Glucose, capillary     Status: Abnormal   Collection Time: 04/28/20 11:17 AM  Result Value Ref Range   Glucose-Capillary 114 (H) 70 - 99 mg/dL    Comment: Glucose reference range applies only to samples taken after fasting for at least 8 hours.  Glucose,  capillary     Status: Abnormal   Collection Time: 04/28/20 11:50 AM  Result Value Ref Range   Glucose-Capillary 123 (H) 70 - 99 mg/dL    Comment: Glucose reference range applies only to samples taken after fasting for at least 8 hours.  Glucose, capillary     Status: Abnormal   Collection Time: 04/28/20  4:04 PM  Result Value Ref Range   Glucose-Capillary 119 (H) 70 - 99 mg/dL    Comment: Glucose reference range applies only to samples taken after fasting for at least 8 hours.  Glucose, capillary     Status: Abnormal   Collection Time: 04/28/20  9:14 PM  Result Value Ref Range   Glucose-Capillary 113 (H) 70 - 99 mg/dL    Comment: Glucose reference range applies only to samples taken after fasting for at least 8 hours.   Comment 1 Notify RN    Comment 2 Document in Chart   CBC with Differential/Platelet     Status: Abnormal   Collection Time: 04/29/20  4:34 AM  Result Value Ref Range   WBC 7.4 4.0 - 10.5 K/uL   RBC 2.96 (L) 3.87 - 5.11 MIL/uL   Hemoglobin 8.6 (L) 12.0 - 15.0 g/dL   HCT 26.4 (L) 36 - 46 %   MCV 89.2 80.0 - 100.0 fL   MCH 29.1 26.0 - 34.0 pg   MCHC 32.6 30.0 - 36.0 g/dL   RDW 17.6 (H) 11.5 - 15.5 %   Platelets 235 150 - 400 K/uL   nRBC 0.0 0.0 - 0.2 %   Neutrophils Relative % 54 %   Neutro Abs 4.0 1.7 - 7.7 K/uL   Lymphocytes Relative 31 %   Lymphs Abs 2.3 0.7 - 4.0 K/uL   Monocytes Relative 12 %   Monocytes Absolute 0.9 0 - 1 K/uL   Eosinophils Relative 3 %   Eosinophils Absolute 0.2 0 - 0 K/uL   Basophils Relative 0 %   Basophils Absolute 0.0 0 - 0 K/uL   Immature Granulocytes 0 %   Abs Immature Granulocytes 0.03 0.00 - 0.07 K/uL    Comment: Performed at [facility], [address], [place], [place] [address]  Basic metabolic panel     Status: Abnormal   Collection Time: 04/29/20  4:34 AM  Result Value Ref Range   Sodium 139 135 - 145 mmol/L   Potassium 3.8 3.5 - 5.1 mmol/L   Chloride 103 98 - 111 mmol/L   CO2 27 22 - 32 mmol/L   Glucose, Bld  112 (H) 70 - 99 mg/dL    Comment: Glucose reference range applies only to samples taken after fasting for at least 8 hours.   BUN 9 8 -  23 mg/dL   Creatinine, Ser 1.08 (H) 0.44 - 1.00 mg/dL   Calcium 8.7 (L) 8.9 - 10.3 mg/dL   GFR calc non Af Amer 44 (L) >60 mL/min   GFR calc Af Amer 51 (L) >60 mL/min   Anion gap 9 5 - 15    Comment: Performed at [facility] [address], [place], [place] [address]  Magnesium     Status: None   Collection Time: 04/29/20  4:34 AM  Result Value Ref Range   Magnesium 2.0 1.7 - 2.4 mg/dL    Comment: Performed at [facility] [address] [place], [place] [address]  Glucose, capillary     Status: Abnormal   Collection Time: 04/29/20  6:12 AM  Result Value Ref Range   Glucose-Capillary 130 (H) 70 - 99 mg/dL    Comment: Glucose reference range applies only to samples taken after fasting for at least 8 hours.   Comment 1 Notify RN    Comment 2 Document in Chart   Glucose, capillary     Status: Abnormal   Collection Time: 04/29/20 11:33 AM  Result Value Ref Range   Glucose-Capillary 156 (H) 70 - 99 mg/dL    Comment: Glucose reference range applies only to samples taken after fasting for at least 8 hours.  Glucose, capillary     Status: Abnormal   Collection Time: 04/29/20  4:18 PM  Result Value Ref Range   Glucose-Capillary 147 (H) 70 - 99 mg/dL    Comment: Glucose reference range applies only to samples taken after fasting for at least 8 hours.  Glucose, capillary     Status: Abnormal   Collection Time: 04/29/20  9:27 PM  Result Value Ref Range   Glucose-Capillary 136 (H) 70 - 99 mg/dL    Comment: Glucose reference range applies only to samples taken after fasting for at least 8 hours.  CBC with Differential/Platelet     Status: Abnormal   Collection Time: 04/30/20  4:29 AM  Result Value Ref Range   WBC 7.2 4.0 - 10.5 K/uL   RBC 2.80 (L) 3.87 - 5.11 MIL/uL   Hemoglobin 8.0 (L) 12.0 - 15.0 g/dL   HCT 25.3 (L) 36 - 46 %   MCV 90.4  80.0 - 100.0 fL   MCH 28.6 26.0 - 34.0 pg   MCHC 31.6 30.0 - 36.0 g/dL   RDW 17.4 (H) 11.5 - 15.5 %   Platelets 234 150 - 400 K/uL   nRBC 0.0 0.0 - 0.2 %   Neutrophils Relative % 55 %   Neutro Abs 3.9 1.7 - 7.7 K/uL   Lymphocytes Relative 30 %   Lymphs Abs 2.2 0.7 - 4.0 K/uL   Monocytes Relative 11 %   Monocytes Absolute 0.8 0 - 1 K/uL   Eosinophils Relative 4 %   Eosinophils Absolute 0.3 0 - 0 K/uL   Basophils Relative 0 %   Basophils Absolute 0.0 0 - 0 K/uL   Immature Granulocytes 0 %   Abs Immature Granulocytes 0.03 0.00 - 0.07 K/uL    Comment: Performed at [facility], [address], [place], [place] [address]  Basic metabolic panel     Status: Abnormal   Collection Time: 04/30/20  4:29 AM  Result Value Ref Range   Sodium 140 135 - 145 mmol/L   Potassium 3.5 3.5 - 5.1 mmol/L   Chloride 103 98 - 111 mmol/L   CO2 29 22 - 32 mmol/L  Glucose, Bld 120 (H) 70 - 99 mg/dL    Comment: Glucose reference range applies only to samples taken after fasting for at least 8 hours.   BUN 9 8 - 23 mg/dL   Creatinine, Ser 1.08 (H) 0.44 - 1.00 mg/dL   Calcium 8.4 (L) 8.9 - 10.3 mg/dL   GFR calc non Af Amer 44 (L) >60 mL/min   GFR calc Af Amer 51 (L) >60 mL/min   Anion gap 8 5 - 15    Comment: Performed at [facility] [address], [place], [place] [address]  Magnesium     Status: None   Collection Time: 04/30/20  4:29 AM  Result Value Ref Range   Magnesium 2.0 1.7 - 2.4 mg/dL    Comment: Performed at [facility] [address], [place], [place] [address]  Glucose, capillary     Status: Abnormal   Collection Time: 04/30/20  4:46 AM  Result Value Ref Range   Glucose-Capillary 129 (H) 70 - 99 mg/dL    Comment: Glucose reference range applies only to samples taken after fasting for at least 8 hours.  Glucose, capillary     Status: Abnormal   Collection Time: 04/30/20  6:41 AM  Result Value Ref Range   Glucose-Capillary 129 (H) 70 - 99 mg/dL    Comment: Glucose  reference range applies only to samples taken after fasting for at least 8 hours.    DG Ankle 2 Views Left  Result Date: 04/30/2020 CLINICAL DATA:  [place]. Injured left ankle. EXAM: LEFT ANKLE - 2 VIEW COMPARISON:  None. FINDINGS: There is an oblique coursing relatively nondisplaced fracture involving the distal fibular shaft well above the ankle mortise. No fracture of the tibia is identified. The mid and hindfoot bony structures are intact. Calcaneal spurring changes are noted. IMPRESSION: Relatively nondisplaced distal fibular shaft fracture. Electronically Signed   By: Marijo Sanes M.D.   On: 04/30/2020 08:12   NM GI Blood Loss  Result Date: 04/29/2020 CLINICAL DATA:  GI bleed EXAM: NUCLEAR MEDICINE GASTROINTESTINAL BLEEDING SCAN TECHNIQUE: Sequential abdominal images were obtained following intravenous administration of Tc-83mlabeled red blood cells. RADIOPHARMACEUTICALS:  24.6 mCi Tc-91mertechnetate in-vitro labeled red cells. COMPARISON:  CT 04/28/2020 FINDINGS: Imaging was performed for 2 hours. There is no radiotracer accumulation to suggest or localize active GI bleed. IMPRESSION: No visible active GI bleed. Electronically Signed   By: KeRolm Baptise.D.   On: 04/29/2020 16:01   CT ABDOMEN PELVIS W CONTRAST  Result Date: 04/28/2020 CLINICAL DATA:  [age]ear old female with GI bleed. EXAM: CT ABDOMEN AND PELVIS WITH CONTRAST TECHNIQUE: Multidetector CT imaging of the abdomen and pelvis was performed using the standard protocol following bolus administration of intravenous contrast. CONTRAST:  8042mMNIPAQUE IOHEXOL 300 MG/ML  SOLN COMPARISON:  CT abdomen pelvis dated 10/05/2019. FINDINGS: Lower chest: The visualized lung bases are clear. There is calcification of the mitral annulus. No intra-abdominal free air or free fluid. Hepatobiliary: The liver is unremarkable. No intrahepatic biliary ductal dilatation. There is a 2.5 cm noncalcified stone in the gallbladder. No pericholecystic fluid or  evidence of acute cholecystitis by CT. Pancreas: Unremarkable. No pancreatic ductal dilatation or surrounding inflammatory changes. Spleen: Normal in size without focal abnormality. Adrenals/Urinary Tract: Mild left adrenal nodularity. The right adrenal gland is unremarkable. There is no hydronephrosis on either side. There is an extrarenal pelvis with mild pelviectasis on the right. There is symmetric enhancement and excretion of contrast by both kidneys. Moderate  bilateral renal parenchyma atrophy with cortical scarring and irregularity. There is a 3 mm nonobstructing left renal upper pole calculus. Multiple bilateral renal cysts as well as several subcentimeter hypodense lesions which are too small to characterize. The visualized ureters and urinary bladder appear unremarkable. Stomach/Bowel: There is severe sigmoid diverticulosis without active inflammatory changes. Evaluation for GI bleed is very limited, almost nondiagnostic due to presence of oral contrast. There is no bowel obstruction or active inflammation. The appendix is normal. Vascular/Lymphatic: Moderate aortoiliac atherosclerotic disease. The IVC is unremarkable. No portal venous gas. There is no adenopathy. Reproductive: The uterus and ovaries are grossly unremarkable. Other: None Musculoskeletal: Osteopenia with degenerative changes of the spine. Total right hip arthroplasty. No acute osseous pathology. IMPRESSION: 1. No acute intra-abdominal or pelvic pathology. 2. Severe sigmoid diverticulosis. No bowel obstruction. Normal appendix. 3. Cholelithiasis. 4. Aortic Atherosclerosis (ICD10-I70.0). Electronically Signed   By: Anner Crete M.D.   On: 04/28/2020 19:22    Review of Systems  HENT: Negative for ear discharge, ear pain, hearing loss and tinnitus.   Eyes: Negative for photophobia and pain.  Respiratory: Negative for cough and shortness of breath.   Cardiovascular: Negative for chest pain.  Gastrointestinal: Negative for abdominal  pain, nausea and vomiting.  Genitourinary: Negative for dysuria, flank pain, frequency and urgency.  Musculoskeletal: Positive for arthralgias (Left ankle). Negative for back pain, myalgias and neck pain.  Neurological: Negative for dizziness and headaches.  Hematological: Does not bruise/bleed easily.  Psychiatric/Behavioral: The patient is not nervous/anxious.    Blood pressure (!) 105/43, pulse 81, temperature 98.1 F (36.7 C), temperature source Oral, resp. rate (!) 25, height 5' 5"  (1.651 m), weight 75.2 kg, SpO2 96 %. Physical Exam Constitutional:      General: She is not in acute distress.    Appearance: She is well-developed. She is not diaphoretic.  HENT:     Head: Normocephalic and atraumatic.  Eyes:     General: No scleral icterus.       Right eye: No discharge.        Left eye: No discharge.     Conjunctiva/sclera: Conjunctivae normal.  Cardiovascular:     Rate and Rhythm: Normal rate and regular rhythm.  Pulmonary:     Effort: Pulmonary effort is normal. No respiratory distress.  Musculoskeletal:     Cervical back: Normal range of motion.     Comments: LLE No traumatic wounds, ecchymosis, or rash  TTP distal fibula  No knee or ankle effusion  Knee stable to varus/ valgus and anterior/posterior stress  Sens DPN, SPN, TN intact  Motor EHL, ext, flex, evers 5/5  DP 1+, PT 0, No significant edema  Skin:    General: Skin is warm and dry.  Neurological:     Mental Status: She is alert.  Psychiatric:        Behavior: Behavior normal.     Assessment/Plan: Left fibula fx -- Plan WBAT in CAM boot. Repeat x-rays Sunday after ambulation. If fx displaces may need surgery. F/u with Dr. Doreatha Martin next week. Multiple medical problems including palpitations, minimal CAD on cath in 2005, moderate to severe AS on echo in 09/27/2019 with normal LVEF 60 to 65% and grade 1 diastolic dysfunction, hypertension, diabetes mellitus type 2, hyperlipidemia, unspecified CVA in January 2021 and  TIA in July 2021 for which she was on aspirin and Plavix, status post CEA on 10/21/2019 was admitted on 04/26/2020 with weakness and melena -- per primary service    Lisette Abu,  PA-C Orthopedic Surgery [phone] 04/30/2020, 10:48 AM

## 2020-04-30 NOTE — Progress Notes (Addendum)
Patient admitted 4 days ago for acute GIB and was being assisted to the toilet this am when she fell to her knees. She appeared to have brief LOC while being assisted back to her feet and appeared pale per RN report. She did not hit her head. SBP was 90s immediately after this event and has since improved to 140s.   Patient reports feeling hot and lightheaded when she got up, reports acute left ankle pain after the fall, but denies any palpitations, chest discomfort, or any other pain. She no longer feels hot or lightheaded back in bed now but has left ankle pain. Left ankle tender with mild edema, neurovascularly intact.   CBC was already ordered by RN, type and screen is still active from a couple days ago. Will also check plain films of the left ankle, treat her pain, continue fall precautions, give small fluid bolus, transfuse if Hgb <7.

## 2020-04-30 NOTE — Progress Notes (Signed)
DAILY PROGRESS NOTE   Patient Name: Denise Parrish Date of Encounter: 04/30/2020 Cardiologist: Dorris Carnes, MD  Chief Complaint   No complaints  Patient Profile   Danna Sewell Demarais is a 86 y.o. female with a hx of mod-severe AS who is being seen today for the evaluation of chest pain at the request of Dr. Jeannetta Nap  Subjective   Feels worse today. Tagged RBC scan yesterday negative for acute bleeding. Started on low dose aspirin for NSTEMI - will avoid DAPT given recent melena. Noted to have a fall overnight. Says that she "passed out" after getting up off the commode, became dizzy and hot all over - sounds vasovagal. C/o left ankle pain - x-rays demonstrate a new ankle fracture. Now in an air cast.  Objective   Vitals:   04/30/20 0532 04/30/20 0601 04/30/20 0630 04/30/20 0631  BP: (!) 108/49 (!) 89/71 (!) 94/44 (!) 105/43  Pulse: 75 65 80 81  Resp:      Temp:      TempSrc:      SpO2: 94% 96% 93% 96%  Weight:      Height:        Intake/Output Summary (Last 24 hours) at 04/30/2020 0943 Last data filed at 04/30/2020 0519 Gross per 24 hour  Intake 850 ml  Output 400 ml  Net 450 ml   Filed Weights   04/27/20 1850 04/28/20 0422 04/29/20 0447  Weight: 75.5 kg 75.5 kg 75.2 kg    Physical Exam   General appearance: alert and no distress Neck: no carotid bruit, no JVD and thyroid not enlarged, symmetric, no tenderness/mass/nodules Lungs: clear to auscultation bilaterally Heart: regular rate and rhythm, S1, S2 normal and systolic murmur: systolic ejection 3/6, crescendo at 2nd right intercostal space Abdomen: soft, non-tender; bowel sounds normal; no masses,  no organomegaly Extremities: left ankle in air cast Pulses: 2+ and symmetric Skin: Skin color, texture, turgor normal. No rashes or lesions Neurologic: Grossly normal Psych: Pleasant  Inpatient Medications    Scheduled Meds: . aspirin  81 mg Oral Daily  . insulin aspart  0-6 Units Subcutaneous TID WC  .  pantoprazole (PROTONIX) IV  40 mg Intravenous Q12H    Continuous Infusions:   PRN Meds: acetaminophen **OR** acetaminophen, HYDROcodone-acetaminophen, LORazepam, ondansetron **OR** ondansetron (ZOFRAN) IV   Labs   Results for orders placed or performed during the hospital encounter of 04/26/20 (from the past 48 hour(s))  Surgical pathology     Status: None   Collection Time: 04/28/20  9:57 AM  Result Value Ref Range   SURGICAL PATHOLOGY      SURGICAL PATHOLOGY CASE: [id number] PATIENT: [name] Surgical Pathology Report     Clinical History: melena, anemia (cm)     FINAL MICROSCOPIC DIAGNOSIS:  A. STOMACH, POLYPECTOMY: - Fundic gland polyp(s) - Gastric oxyntic mucosa with no specific histopathologic changes    GROSS DESCRIPTION:  Received in formalin are tan, soft tissue fragments that are submitted in toto. Number: 4 size: Range from 0.1 to 0.4 cm blocks: 1 Craig Staggers 04/29/2020)    Final Diagnosis performed by Jaquita Folds, MD.   Electronically signed 04/29/2020 Technical component performed at [employer]. [facility], [place] [address] [place], [place] [address].  Professional component performed at [facility], [place] [address], [place], [place] [address].  Immunohistochemistry Technical component (if applicable) was performed at [facility]. [address] [place], [place], [place] [address].   IMMUNOHISTOCHEMISTRY DISCLAIMER (if appli cable): Some of  these immunohistochemical stains may have been developed and the performance characteristics determine by [facility]. Some may not have been cleared or approved by the U.S. Food and Drug Administration. The FDA has determined that such clearance or approval is not necessary. This test is used for clinical purposes. It should not be regarded as investigational or for research. This laboratory is certified under the [place] (CLIA-88) as qualified to perform high complexity clinical laboratory testing.  The controls stained appropriately.   Glucose, capillary     Status: Abnormal   Collection Time: 04/28/20 11:17 AM  Result Value Ref Range   Glucose-Capillary 114 (H) 70 - 99 mg/dL    Comment: Glucose reference range applies only to samples taken after fasting for at least 8 hours.  Glucose, capillary     Status: Abnormal   Collection Time: 04/28/20 11:50 AM  Result Value Ref Range   Glucose-Capillary 123 (H) 70 - 99 mg/dL    Comment: Glucose reference range applies only to samples taken after fasting for at least 8 hours.  Glucose, capillary     Status: Abnormal   Collection Time: 04/28/20  4:04 PM  Result Value Ref Range   Glucose-Capillary 119 (H) 70 - 99 mg/dL    Comment: Glucose reference range applies only to samples taken after fasting for at least 8 hours.  Glucose, capillary     Status: Abnormal   Collection Time: 04/28/20  9:14 PM  Result Value Ref Range   Glucose-Capillary 113 (H) 70 - 99 mg/dL    Comment: Glucose reference range applies only to samples taken after fasting for at least 8 hours.   Comment 1 Notify RN    Comment 2 Document in Chart   CBC with Differential/Platelet     Status: Abnormal   Collection Time: 04/29/20  4:34 AM  Result Value Ref Range   WBC 7.4 4.0 - 10.5 K/uL   RBC 2.96 (L) 3.87 - 5.11 MIL/uL   Hemoglobin 8.6 (L) 12.0 - 15.0 g/dL   HCT 26.4 (L) 36 - 46 %   MCV 89.2 80.0 - 100.0 fL   MCH 29.1 26.0 - 34.0 pg   MCHC 32.6 30.0 - 36.0 g/dL   RDW 17.6 (H) 11.5 - 15.5 %   Platelets 235 150 - 400 K/uL   nRBC 0.0 0.0 - 0.2 %   Neutrophils Relative % 54 %   Neutro Abs 4.0 1.7 - 7.7 K/uL   Lymphocytes Relative 31 %   Lymphs Abs 2.3 0.7 - 4.0 K/uL   Monocytes Relative 12 %   Monocytes Absolute 0.9 0 - 1 K/uL   Eosinophils Relative 3 %   Eosinophils Absolute 0.2 0 - 0 K/uL   Basophils Relative 0 %   Basophils Absolute 0.0 0 - 0 K/uL   Immature  Granulocytes 0 %   Abs Immature Granulocytes 0.03 0.00 - 0.07 K/uL    Comment: Performed at [facility], [address] [place], [place] [address]  Basic metabolic panel     Status: Abnormal   Collection Time: 04/29/20  4:34 AM  Result Value Ref Range   Sodium 139 135 - 145 mmol/L   Potassium 3.8 3.5 - 5.1 mmol/L   Chloride 103 98 - 111 mmol/L   CO2 27 22 - 32 mmol/L   Glucose, Bld 112 (H) 70 - 99 mg/dL    Comment: Glucose reference range applies only to samples taken after fasting for at  least 8 hours.   BUN 9 8 - 23 mg/dL   Creatinine, Ser 1.08 (H) 0.44 - 1.00 mg/dL   Calcium 8.7 (L) 8.9 - 10.3 mg/dL   GFR calc non Af Amer 44 (L) >60 mL/min   GFR calc Af Amer 51 (L) >60 mL/min   Anion gap 9 5 - 15    Comment: Performed at [facility] [address] [place], [place] [address]  Magnesium     Status: None   Collection Time: 04/29/20  4:34 AM  Result Value Ref Range   Magnesium 2.0 1.7 - 2.4 mg/dL    Comment: Performed at [facility] [address], [place], [place] [address]  Glucose, capillary     Status: Abnormal   Collection Time: 04/29/20  6:12 AM  Result Value Ref Range   Glucose-Capillary 130 (H) 70 - 99 mg/dL    Comment: Glucose reference range applies only to samples taken after fasting for at least 8 hours.   Comment 1 Notify RN    Comment 2 Document in Chart   Glucose, capillary     Status: Abnormal   Collection Time: 04/29/20 11:33 AM  Result Value Ref Range   Glucose-Capillary 156 (H) 70 - 99 mg/dL    Comment: Glucose reference range applies only to samples taken after fasting for at least 8 hours.  Glucose, capillary     Status: Abnormal   Collection Time: 04/29/20  4:18 PM  Result Value Ref Range   Glucose-Capillary 147 (H) 70 - 99 mg/dL    Comment: Glucose reference range applies only to samples taken after fasting for at least 8 hours.  Glucose, capillary     Status: Abnormal   Collection Time: 04/29/20  9:27 PM  Result Value Ref  Range   Glucose-Capillary 136 (H) 70 - 99 mg/dL    Comment: Glucose reference range applies only to samples taken after fasting for at least 8 hours.  CBC with Differential/Platelet     Status: Abnormal   Collection Time: 04/30/20  4:29 AM  Result Value Ref Range   WBC 7.2 4.0 - 10.5 K/uL   RBC 2.80 (L) 3.87 - 5.11 MIL/uL   Hemoglobin 8.0 (L) 12.0 - 15.0 g/dL   HCT 25.3 (L) 36 - 46 %   MCV 90.4 80.0 - 100.0 fL   MCH 28.6 26.0 - 34.0 pg   MCHC 31.6 30.0 - 36.0 g/dL   RDW 17.4 (H) 11.5 - 15.5 %   Platelets 234 150 - 400 K/uL   nRBC 0.0 0.0 - 0.2 %   Neutrophils Relative % 55 %   Neutro Abs 3.9 1.7 - 7.7 K/uL   Lymphocytes Relative 30 %   Lymphs Abs 2.2 0.7 - 4.0 K/uL   Monocytes Relative 11 %   Monocytes Absolute 0.8 0 - 1 K/uL   Eosinophils Relative 4 %   Eosinophils Absolute 0.3 0 - 0 K/uL   Basophils Relative 0 %   Basophils Absolute 0.0 0 - 0 K/uL   Immature Granulocytes 0 %   Abs Immature Granulocytes 0.03 0.00 - 0.07 K/uL    Comment: Performed at [facility], [address], [place], [place] [address]  Basic metabolic panel     Status: Abnormal   Collection Time: 04/30/20  4:29 AM  Result Value Ref Range   Sodium 140 135 - 145 mmol/L   Potassium 3.5 3.5 - 5.1 mmol/L   Chloride 103 98 - 111 mmol/L  CO2 29 22 - 32 mmol/L   Glucose, Bld 120 (H) 70 - 99 mg/dL    Comment: Glucose reference range applies only to samples taken after fasting for at least 8 hours.   BUN 9 8 - 23 mg/dL   Creatinine, Ser 1.08 (H) 0.44 - 1.00 mg/dL   Calcium 8.4 (L) 8.9 - 10.3 mg/dL   GFR calc non Af Amer 44 (L) >60 mL/min   GFR calc Af Amer 51 (L) >60 mL/min   Anion gap 8 5 - 15    Comment: Performed at [facility] [address], [place], [place] [address]  Magnesium     Status: None   Collection Time: 04/30/20  4:29 AM  Result Value Ref Range   Magnesium 2.0 1.7 - 2.4 mg/dL    Comment: Performed at [facility] [address] [place], [place] [address]  Glucose,  capillary     Status: Abnormal   Collection Time: 04/30/20  4:46 AM  Result Value Ref Range   Glucose-Capillary 129 (H) 70 - 99 mg/dL    Comment: Glucose reference range applies only to samples taken after fasting for at least 8 hours.  Glucose, capillary     Status: Abnormal   Collection Time: 04/30/20  6:41 AM  Result Value Ref Range   Glucose-Capillary 129 (H) 70 - 99 mg/dL    Comment: Glucose reference range applies only to samples taken after fasting for at least 8 hours.    ECG   N/A - Personally Reviewed  Telemetry   Sinus rhythm - Personally Reviewed  Radiology    DG Ankle 2 Views Left  Result Date: 04/30/2020 CLINICAL DATA:  [place]. Injured left ankle. EXAM: LEFT ANKLE - 2 VIEW COMPARISON:  None. FINDINGS: There is an oblique coursing relatively nondisplaced fracture involving the distal fibular shaft well above the ankle mortise. No fracture of the tibia is identified. The mid and hindfoot bony structures are intact. Calcaneal spurring changes are noted. IMPRESSION: Relatively nondisplaced distal fibular shaft fracture. Electronically Signed   By: Marijo Sanes M.D.   On: 04/30/2020 08:12   NM GI Blood Loss  Result Date: 04/29/2020 CLINICAL DATA:  GI bleed EXAM: NUCLEAR MEDICINE GASTROINTESTINAL BLEEDING SCAN TECHNIQUE: Sequential abdominal images were obtained following intravenous administration of Tc-12mlabeled red blood cells. RADIOPHARMACEUTICALS:  24.6 mCi Tc-912mertechnetate in-vitro labeled red cells. COMPARISON:  CT 04/28/2020 FINDINGS: Imaging was performed for 2 hours. There is no radiotracer accumulation to suggest or localize active GI bleed. IMPRESSION: No visible active GI bleed. Electronically Signed   By: KeRolm Baptise.D.   On: 04/29/2020 16:01   CT ABDOMEN PELVIS W CONTRAST  Result Date: 04/28/2020 CLINICAL DATA:  [age]ear old female with GI bleed. EXAM: CT ABDOMEN AND PELVIS WITH CONTRAST TECHNIQUE: Multidetector CT imaging of the abdomen and pelvis was  performed using the standard protocol following bolus administration of intravenous contrast. CONTRAST:  8085mMNIPAQUE IOHEXOL 300 MG/ML  SOLN COMPARISON:  CT abdomen pelvis dated 10/05/2019. FINDINGS: Lower chest: The visualized lung bases are clear. There is calcification of the mitral annulus. No intra-abdominal free air or free fluid. Hepatobiliary: The liver is unremarkable. No intrahepatic biliary ductal dilatation. There is a 2.5 cm noncalcified stone in the gallbladder. No pericholecystic fluid or evidence of acute cholecystitis by CT. Pancreas: Unremarkable. No pancreatic ductal dilatation or surrounding inflammatory changes. Spleen: Normal in size without focal abnormality. Adrenals/Urinary Tract: Mild left adrenal nodularity. The right adrenal gland is unremarkable. There  is no hydronephrosis on either side. There is an extrarenal pelvis with mild pelviectasis on the right. There is symmetric enhancement and excretion of contrast by both kidneys. Moderate bilateral renal parenchyma atrophy with cortical scarring and irregularity. There is a 3 mm nonobstructing left renal upper pole calculus. Multiple bilateral renal cysts as well as several subcentimeter hypodense lesions which are too small to characterize. The visualized ureters and urinary bladder appear unremarkable. Stomach/Bowel: There is severe sigmoid diverticulosis without active inflammatory changes. Evaluation for GI bleed is very limited, almost nondiagnostic due to presence of oral contrast. There is no bowel obstruction or active inflammation. The appendix is normal. Vascular/Lymphatic: Moderate aortoiliac atherosclerotic disease. The IVC is unremarkable. No portal venous gas. There is no adenopathy. Reproductive: The uterus and ovaries are grossly unremarkable. Other: None Musculoskeletal: Osteopenia with degenerative changes of the spine. Total right hip arthroplasty. No acute osseous pathology. IMPRESSION: 1. No acute intra-abdominal or  pelvic pathology. 2. Severe sigmoid diverticulosis. No bowel obstruction. Normal appendix. 3. Cholelithiasis. 4. Aortic Atherosclerosis (ICD10-I70.0). Electronically Signed   By: Anner Crete M.D.   On: 04/28/2020 19:22    Cardiac Studies   See echo above  Assessment   Principal Problem:   Acute GI bleeding Active Problems:   Diabetes mellitus, type 2 ([place])   Hyperlipidemia   Anxiety   Essential hypertension   Demand ischemia ([place])   Plan   1. Demand ischemia - troponin elevated to around 1800, no chest pain, said she had "intense sweating" last Sunday evening prior to admission - given her acute GI bleeding, medical therapy is recommended. She is off statin, however, LFT's are normal, LDL<70. Restarted low dose aspirin.  2. Aortic stenosis - at least moderate AS by echo and exam  3. Dyslipidemia - LDL 48, not currently on statin due to apparently elevated LFT's, however, the are normal.   4. GI Bleed -No active bleeding, one gastric polyp -CT scan of the abdomen demonstrated "severe sigmoid diverticulosis" - this is the likely source of bleeding, doubt there is a role for colonoscopy or partial colectomy (given age and co-morbidities). -tagged RBC scan negative, restarted aspirin, no plans for colonsocopy  5. HTN -BP well controlled, continue current medication  6. Syncope - event sounded vasovagal in nature, telemetry was normal overnight  7. L ankle fracture  - may need formal orthopedics eval today.  Time Spent Directly with Patient:  I have spent a total of 25 minutes with the patient reviewing hospital notes, telemetry, EKGs, labs and examining the patient as well as establishing an assessment and plan that was discussed personally with the patient.  > 50% of time was spent in direct patient care.  Length of Stay:  LOS: 4 days   Pixie Casino, MD, [facility], [place] Director of the [facility] Lipid [facility] &    Card[facility] Diplomate of the American Board of Clinical Lipidology Attending Cardiologist  Direct Dial: [phone]  Fax: [phone]  Website:  [email or web address] 04/30/2020, 9:43 AM

## 2020-04-30 NOTE — Progress Notes (Signed)
[facility] Gastroenterology Progress Note  Denise Parrish 86 y.o. 1923/03/16  CC: GI bleeding   Subjective: Patient reports feeling weak.  Had a syncopal event this morning which led to her fracturing her ankle.  Believes weakness is due to clear liquid diet.  Requesting advancement of diet.  Last stool was yesterday and was black.  EHO:ZYYQMG of Systems  Gastrointestinal: Positive for diarrhea and melena. Negative for abdominal pain, blood in stool, constipation, heartburn, nausea and vomiting.  Neurological: Positive for loss of consciousness and weakness.    Objective: Vital signs in last 24 hours: Vitals:   04/30/20 0631 04/30/20 1103  BP: (!) 105/43 120/86  Pulse: 81 93  Resp:  16  Temp:  98.2 F (36.8 C)  SpO2: 96% 97%    Physical Exam:  General:   Elderly, lethargic, oriented, cooperative, no acute distress; daughter at bedside  Head:  Normocephalic, without obvious abnormality, atraumatic  Eyes:   Conjunctival pallor, EOM's intact  Lungs:    No visible respiratory distress noted  Heart:  Regular rate and rhythm, + murmur  Abdomen:   Soft, non-tender, nondistended, bowel sounds present.  No peritoneal signs  Extremities:  Left lower extremity in splint    Lab Results: Recent Labs    04/29/20 0434 04/30/20 0429  NA 139 140  K 3.8 3.5  CL 103 103  CO2 27 29  GLUCOSE 112* 120*  BUN 9 9  CREATININE 1.08* 1.08*  CALCIUM 8.7* 8.4*  MG 2.0 2.0   No results for input(s): AST, ALT, ALKPHOS, BILITOT, PROT, ALBUMIN in the last 72 hours. Recent Labs    04/29/20 0434 04/30/20 0429  WBC 7.4 7.2  NEUTROABS 4.0 3.9  HGB 8.6* 8.0*  HCT 26.4* 25.3*  MCV 89.2 90.4  PLT 235 234   No results for input(s): LABPROT, INR in the last 72 hours.    Assessment: GI Bleeding: melena, anemia  -EGD 8/11 was negative for any active bleeding -Bleeding scan 8/12 was negative for active bleeding -Hgb continues to downtrend, now 8.0  -BUN now WNL (9) with Cr 1.08  BUN 28/ Cr  0.98 on 8/9, consistent with upper GI bleeding.  Bleeding most likely resolved, as pt has not had a BM since 8/9 and BUN has normalized.  Aortic stenosis, CAD, CVA: on aspirin and Plavix, last dose 8/9  Syncope: Cardiac vs. symptomatic anemia  Plan: Discussed option of proceeding with colonoscopy due to persistently downtrending hemoglobin and melena.  Patient and patient's daughter do not wish to proceed with colonoscopy, as patient is at increased risk due to age, comorbidities, and severe diverticulosis.  Additionally, they are concerned that patient will not be able to easily ambulate to the commode due to recent fractured ankle.  Recommendation 1 bottle of mag citrate followed by capsule endoscopy today to further evaluate for ongoing bleeding.  Continue to monitor H&H with transfusion as needed to maintain hemoglobin greater than 7-8.  [facility] will follow.  Salley Slaughter MD, [place] 04/30/2020, 11:59 AM  Contact #  [phone]

## 2020-04-30 NOTE — Progress Notes (Signed)
Patient ingested capsule at 1400. Daughter given sheet of times with when the patient may eat/drink RN made aware. RN told to collect leads and capsule device at 0200 (05/01/20) for endo staff to pick up tomorrow.

## 2020-04-30 NOTE — Progress Notes (Signed)
Patient ID: Denise Parrish, female   DOB: 13-Feb-1923, 86 y.o.   MRN: [id number]  PROGRESS NOTE    VELTA ROCKHOLT  YWV:[id number] DOB: October 20, 1922 DOA: 04/26/2020 PCP: [name], MD   Brief Narrative:  86 year old female with history of palpitations, minimal CAD on cath in 2005, moderate to severe AS on echo in 09/27/2019 with normal LVEF 60 to 65% and grade 1 diastolic dysfunction, hypertension, diabetes mellitus type 2, hyperlipidemia, unspecified CVA in January 2021 and TIA in July 2021 for which she was on aspirin and Plavix, status post CEA on 10/21/2019 was admitted on 04/26/2020 with weakness and melena.  Hemoglobin was 7.2 on admission for which she required 1 unit of packed red cell transfusion.  GI and cardiology were consulted.  She was transferred to [facility] for further GI work-up as anesthesia at [facility] did not feel comfortable with providing anesthesia at that facility.  Assessment & Plan:  Acute blood loss anemia Probable upper GI bleeding -Patient presented with hemoglobin of 7.2, hemoglobin was 11.9 on 04/07/2020.  Status post 1 unit of packed red cell transfusion.  Hemoglobin 8 today.  Monitor H&H. -Currently on Protonix 40 mg IV every 12 hours.   -GI following.  Status post EGD on 04/28/2020 which showed no evidence of active bleeding; 1 medium size gastric polyp removed with biopsy forceps.  CT of the abdomen and pelvis done subsequently showed severe sigmoid diverticulosis and cholelithiasis but no acute intra-abdominal or pelvic pathology.  10 red cell scan on 04/29/2020 as per GI did not show any evidence of active bleeding -Currently still on clear liquid diet-follow-up GI recommendations regarding advancement of diet  Unspecified CVA in January 2021 and TIA in July 2021 Recent right CEA on 2-21 for symptomatic right carotid artery stenosis -Patient was on aspirin 325 mg and Plavix 75 mg prior to presentation.  Patient has been resumed on aspirin  81 mg daily. -Resume Lipitor once patient has been resumed on diet.  Elevated troponin -Most likely due to demand ischemia.  Cardiology following and recommending medical management.  Echo with preserved EF of 60 to 65% with grade 2 diastolic dysfunction with moderately elevated pulmonary artery systolic pressure.  Syncope -Patient had a syncopal episode at assisted fall while ambulating from bathroom with nurse tech.  She got really hot and weak and went down to her knees similar did not hit her head.  She was given 250 cc normal saline because her blood pressure was in the 90s.   -DC scheduled metoprolol IV.  Monitor on telemetry  Nondisplaced left distal fibular shaft fracture -Patient started having left ankle pain after syncopal episode/fall.  X-ray shows findings as above. -Consulted orthopedics.  Aortic stenosis -Moderate as per cardiology documentation.  Chronic diastolic heart failure -Compensated.  Echo as above.  Strict input output.  Daily weights.  Cardiology following.  Hypertension -Oral meds on hold.  Monitor blood pressure.  Blood pressure on the lower side.  DC IV metoprolol.  Diabetes mellitus type 2 -A1c 7.  Amaryl on hold -Continue CBGs with SSI  Anxiety disorder -Lorazepam as needed  Generalized conditioning -PT recommends home health PT.  Palliative care following.  Patient remains full code.   DVT prophylaxis: SCDs Code Status: Full Family Communication: Spoke to daughter at bedside Disposition Plan: Status is: Inpatient  Remains inpatient appropriate because:IV treatments appropriate due to intensity of illness or inability to take PO   Dispo:  Patient From: Home  Planned Disposition: Home  expected discharge date:05/01/2020  Medically stable for discharge: No    Consultants: GI/cardiology/palliative care  Procedures:  Echo 1. Left ventricular ejection fraction, by estimation, is 60 to 65%. The  left ventricle has normal function. The  left ventricle has no regional  wall motion abnormalities. There is mild left ventricular hypertrophy.  Left ventricular diastolic parameters  are consistent with Grade II diastolic dysfunction (pseudonormalization).  Elevated left atrial pressure.  2. Right ventricular systolic function is normal. The right ventricular  size is normal. There is moderately elevated pulmonary artery systolic  pressure.  3. The mitral valve is abnormal. Mild mitral valve regurgitation.  4. AV is thickened, calcified with restricted motion. Peak and mean  gradients through the vlave are 49 and 28 mm respectively consistent with  moderate AS. Dimensionless index is 0.37. Compareed to echo from Jan ,  gradients are decreased.. The aortic  valve is abnormal. Aortic valve regurgitation is not visualized. Mild  aortic valve sclerosis is present, with no evidence of aortic valve  stenosis.  5. The inferior vena cava is normal in size with greater than 50%  respiratory variability, suggesting right atrial pressure of 3 mmHg.   EGD on 04/28/2020 Findings:      The Z-line was irregular and was found 35 cm from the incisors.      A 4 cm hiatal hernia was present.      A few small sessile polyps were found in the cardia and in the gastric       body.      A single 7 mm sessile polyp was found in the gastric body. The polyp was       removed with a piecemeal technique using a cold biopsy forceps.       Resection and retrieval were complete.      The cardia and gastric fundus were normal on retroflexion.      The duodenal bulb, first portion of the duodenum and second portion of       the duodenum were normal. Impression:               - Z-line irregular, 35 cm from the incisors.                           - 4 cm hiatal hernia.                           - A few gastric polyps.                           - A single gastric polyp. Resected and retrieved.                           - Normal duodenal bulb, first  portion of the                            duodenum and second portion of the duodenum. Recommendation:           - Return patient to hospital ward for ongoing care.                           - Clear liquid diet.                           -  Continue present medications.                           - Perform a colonoscopy if ongoing bleeding.  Antimicrobials: None   Subjective: Patient seen and examined at bedside.  As per nursing staff, patient had a syncopal episode early this morning along with feeling hot and lightheaded.  No overnight black or bloody bowel movements reported.  No chest pain, fever or vomiting.  Daughter present at bedside.  Objective: Vitals:   04/30/20 0532 04/30/20 0601 04/30/20 0630 04/30/20 0631  BP: (!) 108/49 (!) 89/71 (!) 94/44 (!) 105/43  Pulse: 75 65 80 81  Resp:      Temp:      TempSrc:      SpO2: 94% 96% 93% 96%  Weight:      Height:        Intake/Output Summary (Last 24 hours) at 04/30/2020 0736 Last data filed at 04/30/2020 0519 Gross per 24 hour  Intake 1210 ml  Output 400 ml  Net 810 ml   Filed Weights   04/27/20 1850 04/28/20 0422 04/29/20 0447  Weight: 75.5 kg 75.5 kg 75.2 kg    Examination:  General exam: Sleepy, wakes up only very slightly.  No distress.  Slightly hard of hearing.  Elderly female lying in bed.   Respiratory system: Bilateral decreased breath sounds at bases with some scattered crackles  cardiovascular system: S1-S2 heard, rate controlled Gastrointestinal system: Abdomen is nondistended, soft and nontender.  Normal bowel sounds heard  extremities: Bilateral lower extremity mild edema present.  No cyanosis.  Left ankle splint present with some tenderness in that area.    Data Reviewed: I have personally reviewed following labs and imaging studies  CBC: Recent Labs  Lab 04/26/20 0236 04/26/20 0236 04/26/20 0853 04/26/20 1455 04/26/20 2049 04/27/20 0528 04/28/20 0810 04/29/20 0434 04/30/20 0429  WBC 9.9   < >  9.5  --   --  9.2 8.5 7.4 7.2  NEUTROABS 7.4  --   --   --   --   --  5.5 4.0 3.9  HGB 7.2*   < > 8.8*   < > 8.0* 8.5* 8.8* 8.6* 8.0*  HCT 23.4*   < > 28.1*   < > 25.4* 26.9* 27.5* 26.4* 25.3*  MCV 91.8   < > 90.6  --   --  90.3 88.1 89.2 90.4  PLT 231   < > 225  --   --  206 243 235 234   < > = values in this interval not displayed.   Basic Metabolic Panel: Recent Labs  Lab 04/26/20 0853 04/27/20 0528 04/28/20 0810 04/29/20 0434 04/30/20 0429  NA 139 140 136 139 140  K 3.8 4.2 3.6 3.8 3.5  CL 109 108 101 103 103  CO2 22 25 24 27 29   GLUCOSE 110* 111* 187* 112* 120*  BUN 23 16 12 9 9   CREATININE 0.81 0.82 1.09* 1.08* 1.08*  CALCIUM 8.3* 8.2* 8.8* 8.7* 8.4*  MG  --   --  1.9 2.0 2.0   GFR: Estimated Creatinine Clearance: 31.6 mL/min (A) (by C-G formula based on SCr of 1.08 mg/dL (H)). Liver Function Tests: Recent Labs  Lab 04/26/20 0236 04/26/20 0853 04/27/20 0528  AST 21 25 24   ALT 16 14 15   ALKPHOS 57 57 60  BILITOT 0.7 2.7* 1.6*  PROT 6.0* 5.8* 5.6*  ALBUMIN 3.3* 3.2* 3.1*   No results  for input(s): LIPASE, AMYLASE in the last 168 hours. No results for input(s): AMMONIA in the last 168 hours. Coagulation Profile: Recent Labs  Lab 04/26/20 0236  INR 1.0   Cardiac Enzymes: No results for input(s): CKTOTAL, CKMB, CKMBINDEX, TROPONINI in the last 168 hours. BNP (last 3 results) No results for input(s): PROBNP in the last 8760 hours. HbA1C: No results for input(s): HGBA1C in the last 72 hours. CBG: Recent Labs  Lab 04/29/20 1133 04/29/20 1618 04/29/20 2127 04/30/20 0446 04/30/20 0641  GLUCAP 156* 147* 136* 129* 129*   Lipid Profile: No results for input(s): CHOL, HDL, LDLCALC, TRIG, CHOLHDL, LDLDIRECT in the last 72 hours. Thyroid Function Tests: No results for input(s): TSH, T4TOTAL, FREET4, T3FREE, THYROIDAB in the last 72 hours. Anemia Panel: No results for input(s): VITAMINB12, FOLATE, FERRITIN, TIBC, IRON, RETICCTPCT in the last 72  hours. Sepsis Labs: No results for input(s): PROCALCITON, LATICACIDVEN in the last 168 hours.  Recent Results (from the past 240 hour(s))  SARS Coronavirus 2 by RT PCR (hospital order, performed in [facility] hospital lab) Nasopharyngeal Nasopharyngeal Swab     Status: None   Collection Time: 04/26/20  1:54 AM   Specimen: Nasopharyngeal Swab  Result Value Ref Range Status   SARS Coronavirus 2 NEGATIVE NEGATIVE Final    Comment: (NOTE) SARS-CoV-2 target nucleic acids are NOT DETECTED.  The SARS-CoV-2 RNA is generally detectable in upper and lower respiratory specimens during the acute phase of infection. The lowest concentration of SARS-CoV-2 viral copies this assay can detect is 250 copies / mL. A negative result does not preclude SARS-CoV-2 infection and should not be used as the sole basis for treatment or other patient management decisions.  A negative result may occur with improper specimen collection / handling, submission of specimen other than nasopharyngeal swab, presence of viral mutation(s) within the areas targeted by this assay, and inadequate number of viral copies (<250 copies / mL). A negative result must be combined with clinical observations, patient history, and epidemiological information.  Fact Sheet for Patients:   StrictlyIdeas.no  Fact Sheet for Healthcare Providers: BankingDealers.co.za  This test is not yet approved or  cleared by the [place] FDA and has been authorized for detection and/or diagnosis of SARS-CoV-2 by FDA under an Emergency Use Authorization (EUA).  This EUA will remain in effect (meaning this test can be used) for the duration of the COVID-19 declaration under Section 564(b)(1) of the Act, 21 U.S.C. section 360bbb-3(b)(1), unless the authorization is terminated or revoked sooner.  Performed at [facility], [address], [place], [place] [address]          Radiology  Studies: NM GI Blood Loss  Result Date: 04/29/2020 CLINICAL DATA:  GI bleed EXAM: NUCLEAR MEDICINE GASTROINTESTINAL BLEEDING SCAN TECHNIQUE: Sequential abdominal images were obtained following intravenous administration of Tc-9mlabeled red blood cells. RADIOPHARMACEUTICALS:  24.6 mCi Tc-963mertechnetate in-vitro labeled red cells. COMPARISON:  CT 04/28/2020 FINDINGS: Imaging was performed for 2 hours. There is no radiotracer accumulation to suggest or localize active GI bleed. IMPRESSION: No visible active GI bleed. Electronically Signed   By: KeRolm Baptise.D.   On: 04/29/2020 16:01   CT ABDOMEN PELVIS W CONTRAST  Result Date: 04/28/2020 CLINICAL DATA:  [age]ear old female with GI bleed. EXAM: CT ABDOMEN AND PELVIS WITH CONTRAST TECHNIQUE: Multidetector CT imaging of the abdomen and pelvis was performed using the standard protocol following bolus administration of intravenous contrast. CONTRAST:  8086mMNIPAQUE IOHEXOL 300 MG/ML  SOLN COMPARISON:  CT abdomen pelvis dated 10/05/2019. FINDINGS: Lower chest: The visualized lung bases are clear. There is calcification of the mitral annulus. No intra-abdominal free air or free fluid. Hepatobiliary: The liver is unremarkable. No intrahepatic biliary ductal dilatation. There is a 2.5 cm noncalcified stone in the gallbladder. No pericholecystic fluid or evidence of acute cholecystitis by CT. Pancreas: Unremarkable. No pancreatic ductal dilatation or surrounding inflammatory changes. Spleen: Normal in size without focal abnormality. Adrenals/Urinary Tract: Mild left adrenal nodularity. The right adrenal gland is unremarkable. There is no hydronephrosis on either side. There is an extrarenal pelvis with mild pelviectasis on the right. There is symmetric enhancement and excretion of contrast by both kidneys. Moderate bilateral renal parenchyma atrophy with cortical scarring and irregularity. There is a 3 mm nonobstructing left renal upper pole calculus. Multiple  bilateral renal cysts as well as several subcentimeter hypodense lesions which are too small to characterize. The visualized ureters and urinary bladder appear unremarkable. Stomach/Bowel: There is severe sigmoid diverticulosis without active inflammatory changes. Evaluation for GI bleed is very limited, almost nondiagnostic due to presence of oral contrast. There is no bowel obstruction or active inflammation. The appendix is normal. Vascular/Lymphatic: Moderate aortoiliac atherosclerotic disease. The IVC is unremarkable. No portal venous gas. There is no adenopathy. Reproductive: The uterus and ovaries are grossly unremarkable. Other: None Musculoskeletal: Osteopenia with degenerative changes of the spine. Total right hip arthroplasty. No acute osseous pathology. IMPRESSION: 1. No acute intra-abdominal or pelvic pathology. 2. Severe sigmoid diverticulosis. No bowel obstruction. Normal appendix. 3. Cholelithiasis. 4. Aortic Atherosclerosis (ICD10-I70.0). Electronically Signed   By: Anner Crete M.D.   On: 04/28/2020 19:22        Scheduled Meds: . aspirin  81 mg Oral Daily  . insulin aspart  0-6 Units Subcutaneous TID WC  . pantoprazole (PROTONIX) IV  40 mg Intravenous Q12H   Continuous Infusions:        Aline August, MD [facility] 04/30/2020, 7:36 AM \

## 2020-04-30 NOTE — Progress Notes (Signed)
   04/30/20 0445  Vitals  Temp 98.1 F (36.7 C)  Temp Source Oral  BP (!) 91/51  MAP (mmHg) (!) 64  BP Location Left Arm  BP Method Automatic  Patient Position (if appropriate) Lying  Pulse Rate 99  Resp (!) 25  MEWS COLOR  MEWS Score Color Yellow  Oxygen Therapy  SpO2 93 %  MEWS Score  MEWS Temp 0  MEWS Systolic 1  MEWS Pulse 0  MEWS RR 1  MEWS LOC 0  MEWS Score 2  Patient had a syncopal episode and assisted fall while ambulating from bathroom with Nurse Tech. Patient states that she got really hot and weak and went down to her knees. Patient did not hit head. NT was present during episode and guided patient to the floor. Dr Myna Hidalgo paged to the bedside to see patein. Order for statt cbc xray of left ankle 250cc bolus and  pain meds ordered. Patein's daughter [name] called and updated. Patiet is currently resting I bed. Will continue to monitor.

## 2020-04-30 NOTE — Progress Notes (Signed)
Orthopedic Tech Progress Note Patient Details:  Denise Parrish [birth year] [id number]  Fitted patient for cam walker boot then removed it as she was in bed. I left the device in the patients room for reapplication once she is mobile. Ortho Devices Type of Ortho Device: CAM walker Ortho Device/Splint Location: Left Lower Extremity Ortho Device/Splint Interventions: Ordered, Application, Adjustment  Post Interventions Patient Tolerated: Well Instructions Provided: Adjustment of device, Care of device, Poper ambulation with device   Denise Parrish P Denise Parrish 04/30/2020, 12:51 PM

## 2020-04-30 NOTE — Progress Notes (Signed)
Orthopedic Tech Progress Note Patient Details:  Denise Parrish Jul 18, 1923 [id number]  Ortho Devices Type of Ortho Device: Ankle Air splint Ortho Device/Splint Location: Left Ankle Ortho Device/Splint Interventions: Application, Adjustment   Post Interventions Patient Tolerated: Well Instructions Provided: Adjustment of device   Denise Parrish Denise Parrish 04/30/2020, 7:05 AM

## 2020-05-01 DIAGNOSIS — I248 Other forms of acute ischemic heart disease: Secondary | ICD-10-CM | POA: Diagnosis not present

## 2020-05-01 DIAGNOSIS — E785 Hyperlipidemia, unspecified: Secondary | ICD-10-CM

## 2020-05-01 DIAGNOSIS — K922 Gastrointestinal hemorrhage, unspecified: Secondary | ICD-10-CM | POA: Diagnosis not present

## 2020-05-01 DIAGNOSIS — I10 Essential (primary) hypertension: Secondary | ICD-10-CM | POA: Diagnosis not present

## 2020-05-01 LAB — BASIC METABOLIC PANEL
Anion gap: 10 (ref 5–15)
BUN: 12 mg/dL (ref 8–23)
CO2: 26 mmol/L (ref 22–32)
Calcium: 8.2 mg/dL — ABNORMAL LOW (ref 8.9–10.3)
Chloride: 104 mmol/L (ref 98–111)
Creatinine, Ser: 1.1 mg/dL — ABNORMAL HIGH (ref 0.44–1.00)
GFR calc Af Amer: 49 mL/min — ABNORMAL LOW (ref >60)
GFR calc non Af Amer: 43 mL/min — ABNORMAL LOW (ref >60)
Glucose, Bld: 142 mg/dL — ABNORMAL HIGH (ref 70–99)
Potassium: 3.5 mmol/L (ref 3.5–5.1)
Sodium: 140 mmol/L (ref 135–145)

## 2020-05-01 LAB — GLUCOSE, CAPILLARY
Glucose-Capillary: 142 mg/dL — ABNORMAL HIGH (ref 70–99)
Glucose-Capillary: 155 mg/dL — ABNORMAL HIGH (ref 70–99)
Glucose-Capillary: 184 mg/dL — ABNORMAL HIGH (ref 70–99)

## 2020-05-01 LAB — CBC WITH DIFFERENTIAL/PLATELET
Abs Immature Granulocytes: 0.03 10*3/uL (ref 0.00–0.07)
Basophils Absolute: 0 10*3/uL (ref 0.0–0.1)
Basophils Relative: 0 %
Eosinophils Absolute: 0.2 10*3/uL (ref 0.0–0.5)
Eosinophils Relative: 3 %
HCT: 28.9 % — ABNORMAL LOW (ref 36.0–46.0)
Hemoglobin: 9.2 g/dL — ABNORMAL LOW (ref 12.0–15.0)
Immature Granulocytes: 0 %
Lymphocytes Relative: 22 %
Lymphs Abs: 1.6 10*3/uL (ref 0.7–4.0)
MCH: 28.2 pg (ref 26.0–34.0)
MCHC: 31.8 g/dL (ref 30.0–36.0)
MCV: 88.7 fL (ref 80.0–100.0)
Monocytes Absolute: 0.9 10*3/uL (ref 0.1–1.0)
Monocytes Relative: 12 %
Neutro Abs: 4.8 10*3/uL (ref 1.7–7.7)
Neutrophils Relative %: 63 %
Platelets: 208 10*3/uL (ref 150–400)
RBC: 3.26 MIL/uL — ABNORMAL LOW (ref 3.87–5.11)
RDW: 17.2 % — ABNORMAL HIGH (ref 11.5–15.5)
WBC: 7.6 10*3/uL (ref 4.0–10.5)
nRBC: 0 % (ref 0.0–0.2)

## 2020-05-01 LAB — TYPE AND SCREEN
ABO/RH(D): AB POS
Antibody Screen: NEGATIVE
Unit division: 0

## 2020-05-01 LAB — BPAM RBC
Blood Product Expiration Date: 202108302359
ISSUE DATE / TIME: 202108131314
Unit Type and Rh: 8400

## 2020-05-01 LAB — MAGNESIUM: Magnesium: 2.3 mg/dL (ref 1.7–2.4)

## 2020-05-01 MED ORDER — ATORVASTATIN CALCIUM 40 MG PO TABS
40.0000 mg | ORAL_TABLET | Freq: Every day | ORAL | Status: DC
  Administered 2020-05-01 – 2020-05-07 (×7): 40 mg via ORAL
  Filled 2020-05-01 (×7): qty 1

## 2020-05-01 NOTE — Progress Notes (Signed)
[facility] Progress Note  Denise Parrish 86 y.o. 10-13-1922  CC: GI bleed   Subjective: Patient seen and examined at bedside.  Denies any further bleeding episodes.  Tolerating diet.  Capsule endoscopy completed yesterday.  Capsule retained in the stomach for most of the time.  Limited evaluation of duodenum did not showed any active bleeding.  ROS : Afebrile.  Negative for chest pain.   Objective: Vital signs in last 24 hours: Vitals:   04/30/20 1937 05/01/20 0404  BP: 117/68 137/70  Pulse: 98 95  Resp: 20 20  Temp: 99.2 F (37.3 C) 98.4 F (36.9 C)  SpO2: 95% 95%    Physical Exam:  General:   Elderly patient.  Not in acute distress  Head:  Normocephalic, without obvious abnormality, atraumatic  Eyes:  , EOM's intact,   Lungs:    No visible respiratory distress noted  Heart:  Regular rate and rhythm, murmur noted  Abdomen:   Soft, non-tender, nondistended, bowel sounds present.  No peritoneal signs  Extremities: Extremities normal, atraumatic, no  edema       Lab Results: Recent Labs    04/30/20 0429 05/01/20 0504  NA 140 140  K 3.5 3.5  CL 103 104  CO2 29 26  GLUCOSE 120* 142*  BUN 9 12  CREATININE 1.08* 1.10*  CALCIUM 8.4* 8.2*  MG 2.0 2.3   No results for input(s): AST, ALT, ALKPHOS, BILITOT, PROT, ALBUMIN in the last 72 hours. Recent Labs    04/30/20 0429 04/30/20 0429 04/30/20 2027 05/01/20 0504  WBC 7.2  --   --  7.6  NEUTROABS 3.9  --   --  4.8  HGB 8.0*   < > 9.8* 9.2*  HCT 25.3*   < > 31.0* 28.9*  MCV 90.4  --   --  88.7  PLT 234  --   --  208   < > = values in this interval not displayed.   No results for input(s): LABPROT, INR in the last 72 hours.    Assessment/Plan: -GI bleed with melanotic stool.  Resolved now.  Appropriate increase in hemoglobin from 8 to 9.2 with 1 unit of blood transfusion.  EGD 04/28/2020  negative for active bleeding .   GI bleeding scan negative.   CT abdomen pelvis showed severe  diverticulosis.. -History of CVA.  Aspirin and Plavix on hold -Demand ischemia.  Recommendations -----------------------  Appropriate increase in hemoglobin from 8 to 9.2 with 1 unit of blood transfusion. -Capsule endoscopy study was limited as capsule remained in the stomach for more than 11 hours.  Limited evaluation of the duodenum showed no active bleeding. -Discussed with patient and family.  They do not want any further investigation or work-up. -Okay to resume Plavix from GI standpoint because of history of CVA.  If no further bleeding, okay to add low-dose aspirin next week. -Advance diet as tolerated -GI will sign off.  Call us back if needed   Otis Brace MD, [place] 05/01/2020, 11:08 AM  Contact #  [phone]

## 2020-05-01 NOTE — Progress Notes (Signed)
Ortho Trauma Note  Patient seen and examined.  Please see attestation to consult note performed yesterday.  Left ankle fracture status post fall.  We will plan for nonoperative treatment for now however if there is any displacement we will have to discuss possibility of surgical intervention.  However I would like to treat nonoperatively due to her diabetes, peripheral artery disease and peripheral neuropathy.  She is at high risk for perioperative and post surgical complications.  Shona Needles, MD [facility] [phone] (office) [email or web address]

## 2020-05-01 NOTE — Progress Notes (Signed)
Cardiology Progress Note  Patient ID: Denise Parrish MRN: [id number] DOB: 1922-09-22 Date of Encounter: 05/01/2020  Primary Cardiologist: Dorris Carnes, MD  Subjective   Chief Complaint: L leg pain  HPI: Started capsule endoscopy. Now with L ankle fracture. Reports SOB with movement.   ROS:  All other ROS reviewed and negative. Pertinent positives noted in the HPI.     Inpatient Medications  Scheduled Meds: . atorvastatin  40 mg Oral Daily  . insulin aspart  0-6 Units Subcutaneous TID WC  . pantoprazole (PROTONIX) IV  40 mg Intravenous Q12H   Continuous Infusions:  PRN Meds: acetaminophen **OR** acetaminophen, LORazepam, ondansetron **OR** ondansetron (ZOFRAN) IV   Vital Signs   Vitals:   04/30/20 1619 04/30/20 1937 05/01/20 0404 05/01/20 0408  BP: 132/72 117/68 137/70   Pulse: 94 98 95   Resp: 16 20 20    Temp: 99.4 F (37.4 C) 99.2 F (37.3 C) 98.4 F (36.9 C)   TempSrc: Axillary Oral Oral   SpO2: 97% 95% 95%   Weight:    70.4 kg  Height:        Intake/Output Summary (Last 24 hours) at 05/01/2020 0918 Last data filed at 05/01/2020 0600 Gross per 24 hour  Intake 783.33 ml  Output 200 ml  Net 583.33 ml   Last 3 Weights 05/01/2020 04/29/2020 04/28/2020  Weight (lbs) 155 lb 3.3 oz 165 lb 11.2 oz 166 lb 6.4 oz  Weight (kg) 70.4 kg 75.161 kg 75.479 kg      Telemetry  Overnight telemetry shows SR 80-90 bpm, which I personally reviewed.   ECG  The most recent ECG shows SR 90 bpm, anterolateral ST depressions, which I personally reviewed.   Physical Exam   Vitals:   04/30/20 1619 04/30/20 1937 05/01/20 0404 05/01/20 0408  BP: 132/72 117/68 137/70   Pulse: 94 98 95   Resp: 16 20 20    Temp: 99.4 F (37.4 C) 99.2 F (37.3 C) 98.4 F (36.9 C)   TempSrc: Axillary Oral Oral   SpO2: 97% 95% 95%   Weight:    70.4 kg  Height:         Intake/Output Summary (Last 24 hours) at 05/01/2020 0918 Last data filed at 05/01/2020 0600 Gross per 24 hour  Intake 783.33 ml   Output 200 ml  Net 583.33 ml    Last 3 Weights 05/01/2020 04/29/2020 04/28/2020  Weight (lbs) 155 lb 3.3 oz 165 lb 11.2 oz 166 lb 6.4 oz  Weight (kg) 70.4 kg 75.161 kg 75.479 kg    Body mass index is 25.83 kg/m.   General: Well nourished, well developed, in no acute distress Head: Atraumatic, normal size  Eyes: PEERLA, EOMI  Neck: Supple, no JVD Endocrine: No thryomegaly Cardiac: Normal S1, S2; RRR; 3/6 SEM early peaking  Lungs: Clear to auscultation bilaterally, no wheezing, rhonchi or rales  Abd: Soft, nontender, no hepatomegaly  Ext: L ankle in soft cast, no edema  Musculoskeletal: No deformities, BUE and BLE strength normal and equal Skin: Warm and dry, no rashes   Neuro: Alert and oriented to person, place, time, and situation, CNII-XII grossly intact, no focal deficits  Psych: Normal mood and affect   Labs  High Sensitivity Troponin:   Recent Labs  Lab 04/26/20 0236 04/26/20 0346 04/26/20 0853 04/26/20 1143  TROPONINIHS 380* 431* 1,314* 1,891*     Cardiac EnzymesNo results for input(s): TROPONINI in the last 168 hours. No results for input(s): TROPIPOC in the last 168 hours.  Chemistry Recent  Labs  Lab 04/26/20 0236 04/26/20 0236 04/26/20 0354 04/26/20 6568 04/27/20 0528 04/28/20 0810 04/29/20 0434 04/30/20 0429 05/01/20 0504  NA 139   < > 139   < > 140   < > 139 140 140  K 4.0   < > 3.8   < > 4.2   < > 3.8 3.5 3.5  CL 109   < > 109   < > 108   < > 103 103 104  CO2 22   < > 22   < > 25   < > 27 29 26   GLUCOSE 167*   < > 110*   < > 111*   < > 112* 120* 142*  BUN 28*   < > 23   < > 16   < > 9 9 12   CREATININE 0.98   < > 0.81   < > 0.82   < > 1.08* 1.08* 1.10*  CALCIUM 8.3*   < > 8.3*   < > 8.2*   < > 8.7* 8.4* 8.2*  PROT 6.0*  --  5.8*  --  5.6*  --   --   --   --   ALBUMIN 3.3*  --  3.2*  --  3.1*  --   --   --   --   AST 21  --  25  --  24  --   --   --   --   ALT 16  --  14  --  15  --   --   --   --   ALKPHOS 57  --  57  --  60  --   --   --   --     BILITOT 0.7  --  2.7*  --  1.6*  --   --   --   --   GFRNONAA 49*   < > >60   < > >60   < > 44* 44* 43*  GFRAA 57*   < > >60   < > >60   < > 51* 51* 49*  ANIONGAP 8   < > 8   < > 7   < > 9 8 10    < > = values in this interval not displayed.    Hematology Recent Labs  Lab 04/29/20 0434 04/29/20 0434 04/30/20 0429 04/30/20 2027 05/01/20 0504  WBC 7.4  --  7.2  --  7.6  RBC 2.96*  --  2.80*  --  3.26*  HGB 8.6*   < > 8.0* 9.8* 9.2*  HCT 26.4*   < > 25.3* 31.0* 28.9*  MCV 89.2  --  90.4  --  88.7  MCH 29.1  --  28.6  --  28.2  MCHC 32.6  --  31.6  --  31.8  RDW 17.6*  --  17.4*  --  17.2*  PLT 235  --  234  --  208   < > = values in this interval not displayed.   BNPNo results for input(s): BNP, PROBNP in the last 168 hours.  DDimer No results for input(s): DDIMER in the last 168 hours.   Radiology  DG Ankle 2 Views Left  Result Date: 04/30/2020 CLINICAL DATA:  [place] [address]. Injured left ankle. EXAM: LEFT ANKLE - 2 VIEW COMPARISON:  None. FINDINGS: There is an oblique coursing relatively nondisplaced fracture involving the distal fibular shaft well above the ankle mortise. No fracture of the tibia is identified. The mid  and hindfoot bony structures are intact. Calcaneal spurring changes are noted. IMPRESSION: Relatively nondisplaced distal fibular shaft fracture. Electronically Signed   By: Marijo Sanes M.D.   On: 04/30/2020 08:12   NM GI Blood Loss  Result Date: 04/29/2020 CLINICAL DATA:  GI bleed EXAM: NUCLEAR MEDICINE GASTROINTESTINAL BLEEDING SCAN TECHNIQUE: Sequential abdominal images were obtained following intravenous administration of Tc-43mlabeled red blood cells. RADIOPHARMACEUTICALS:  24.6 mCi Tc-9106mertechnetate in-vitro labeled red cells. COMPARISON:  CT 04/28/2020 FINDINGS: Imaging was performed for 2 hours. There is no radiotracer accumulation to suggest or localize active GI bleed. IMPRESSION: No visible active GI bleed. Electronically Signed   By: KeRolm Baptise.D.   On:  04/29/2020 16:01    Cardiac Studies   TTE 04/27/2020 1. Left ventricular ejection fraction, by estimation, is 60 to 65%. The  left ventricle has normal function. The left ventricle has no regional  wall motion abnormalities. There is mild left ventricular hypertrophy.  Left ventricular diastolic parameters  are consistent with Grade II diastolic dysfunction (pseudonormalization).  Elevated left atrial pressure.  2. Right ventricular systolic function is normal. The right ventricular  size is normal. There is moderately elevated pulmonary artery systolic  pressure.  3. The mitral valve is abnormal. Mild mitral valve regurgitation.  4. AV is thickened, calcified with restricted motion. Peak and mean  gradients through the vlave are 49 and 28 mm respectively consistent with  moderate AS. Dimensionless index is 0.37. Compareed to echo from Jan ,  gradients are decreased.. The aortic  valve is abnormal. Aortic valve regurgitation is not visualized. Mild  aortic valve sclerosis is present, with no evidence of aortic valve  stenosis.  5. The inferior vena cava is normal in size with greater than 50%  respiratory variability, suggesting right atrial pressure of 3 mmHg.   Patient Profile  Denise Parrish a [age].o. female with moderate AS, HTN, CVA, CEA, HLD admitted to [facility] 04/26/2020 for GIB and elevated troponin. Transferred to [facility] and found to have gastric polyp and severe diverticulosis. Undergoing capsule endoscopy. 8/12 sustained fall and has L ankle fracture. Cardiology asked to evaluate elevated troponin.   Assessment & Plan   1. Elevated Troponin/Demand Ischemia in setting of GIB -no CP. Echo with normal LVEF and no WMA. Moderate AS.  -Hgb still dropping and s/p 1 unit PRBC 8/13 -hold ASA/heparin for now -suspect this is demand in setting of GIB -awaiting pill endoscopy results. Diverticular bleed also possible that resolved. No PUD on EGD.  -will await GI evaluation to  start ASA -continue statin  -favor outpatient evaluation of elevated troponin such as stress and no LHC planned this admission  2. Moderate AS -MG and Vmax consistent with moderate AS -murmur on exam also moderate (e.g., early peaking)  3. Fall/Syncope -weakness upon standing in setting of anemia. Non-cardiac. No arrhythmia on tele -with L ankle fracture   For questions or updates, please contact [facility] consult [email or web address] for contact info under   Time Spent with Patient: I have spent a total of 25 minutes with patient reviewing hospital notes, telemetry, EKGs, labs and examining the patient as well as establishing an assessment and plan that was discussed with the patient.  > 50% of time was spent in direct patient care.    Signed, WeAddison NaegeliO'Audie BoxMDSan Rafael8/14/2021 9:18 AM

## 2020-05-01 NOTE — Progress Notes (Signed)
Called and ordered breakfast

## 2020-05-01 NOTE — Progress Notes (Signed)
Patient ID: Denise Parrish, female   DOB: [birth year], 86 y.o.   MRN: [id number]  PROGRESS NOTE    JAMICE CARRENO  EUM:[id number] DOB: 05-Jul-1923 DOA: 04/26/2020 PCP: [name], MD   Brief Narrative:  86 year old female with history of palpitations, minimal CAD on cath in 2005, moderate to severe AS on echo in 09/27/2019 with normal LVEF 60 to 65% and grade 1 diastolic dysfunction, hypertension, diabetes mellitus type 2, hyperlipidemia, unspecified CVA in January 2021 and TIA in July 2021 for which she was on aspirin and Plavix, status post CEA on 10/21/2019 was admitted on 04/26/2020 with weakness and melena.  Hemoglobin was 7.2 on admission for which she required 1 unit of packed red cell transfusion.  GI and cardiology were consulted.  She was transferred to [facility] for further GI work-up as anesthesia at [facility] did not feel comfortable with providing anesthesia at that facility.  Assessment & Plan:  Acute blood loss anemia Probable upper GI bleeding -Patient presented with hemoglobin of 7.2, hemoglobin was 11.9 on 04/07/2020.  Status post 2units of packed red cell transfusion during the hospitalization, last transfusion was on 04/30/2020.  Hemoglobin 9.2 today.  Monitor H&H. -Currently on Protonix 40 mg IV every 12 hours.   -GI following.  Status post EGD on 04/28/2020 which showed no evidence of active bleeding; 1 medium size gastric polyp removed with biopsy forceps.  CT of the abdomen and pelvis done subsequently showed severe sigmoid diverticulosis and cholelithiasis but no acute intra-abdominal or pelvic pathology.  Tagged red cell scan on 04/29/2020 as per GI did not show any evidence of active bleeding -Capsule endoscopy done on 04/30/2020, report pending.  Follow further GI recommendations.  Unspecified CVA in January 2021 and TIA in July 2021 Recent right CEA on 2-21 for symptomatic right carotid artery stenosis -Patient was on aspirin 325 mg and Plavix 75 mg  prior to presentation.  Patient has been resumed on aspirin 81 mg daily. -Lipitor has been resumed.  Elevated troponin -Most likely due to demand ischemia.  Cardiology following and recommending medical management.  Echo with preserved EF of 60 to 65% with grade 2 diastolic dysfunction with moderately elevated pulmonary artery systolic pressure.  Syncope -Patient had a syncopal episode at assisted fall while ambulating from bathroom with nurse tech.  She got really hot and weak and went down to her knees similar did not hit her head.  She was given 250 cc normal saline because her blood pressure was in the 90s.   -DC'd scheduled metoprolol IV.  Monitor on telemetry  Nondisplaced left distal fibular shaft fracture -Patient started having left ankle pain after syncopal episode/fall.  X-ray shows findings as above. -Orthopedics following and patient being managed conservatively at this time with weightbearing as tolerated in  CAM boot and repeat x-rays on Sunday after ambulation.  If fracture displaces, patient may need surgery otherwise follow-up with Dr. Doreatha Martin next week.  Aortic stenosis -Moderate as per cardiology documentation.  Chronic diastolic heart failure -Compensated.  Echo as above.  Strict input output.  Daily weights.  Cardiology following.  Hypertension -Oral meds on hold.  Monitor blood pressure.  Blood pressure stable at this time.  Diabetes mellitus type 2 -A1c 7.  Amaryl on hold -Continue CBGs with SSI  Anxiety disorder -Lorazepam as needed  Generalized conditioning -PT recommends home health PT.  Palliative care following.  Patient remains full code.   DVT prophylaxis: SCDs Code Status: Full Family Communication: Spoke to  daughter at bedside on 04/30/2020 Disposition Plan: Status is: Inpatient  Remains inpatient appropriate because:IV treatments appropriate due to intensity of illness or inability to take PO   Dispo:  Patient From: Home  Planned  Disposition: Home  expected discharge date: 05/03/2020  Medically stable for discharge: No    Consultants: GI/cardiology/palliative care  Procedures:  Echo 1. Left ventricular ejection fraction, by estimation, is 60 to 65%. The  left ventricle has normal function. The left ventricle has no regional  wall motion abnormalities. There is mild left ventricular hypertrophy.  Left ventricular diastolic parameters  are consistent with Grade II diastolic dysfunction (pseudonormalization).  Elevated left atrial pressure.  2. Right ventricular systolic function is normal. The right ventricular  size is normal. There is moderately elevated pulmonary artery systolic  pressure.  3. The mitral valve is abnormal. Mild mitral valve regurgitation.  4. AV is thickened, calcified with restricted motion. Peak and mean  gradients through the vlave are 49 and 28 mm respectively consistent with  moderate AS. Dimensionless index is 0.37. Compareed to echo from Jan ,  gradients are decreased.. The aortic  valve is abnormal. Aortic valve regurgitation is not visualized. Mild  aortic valve sclerosis is present, with no evidence of aortic valve  stenosis.  5. The inferior vena cava is normal in size with greater than 50%  respiratory variability, suggesting right atrial pressure of 3 mmHg.   EGD on 04/28/2020 Findings:      The Z-line was irregular and was found 35 cm from the incisors.      A 4 cm hiatal hernia was present.      A few small sessile polyps were found in the cardia and in the gastric       body.      A single 7 mm sessile polyp was found in the gastric body. The polyp was       removed with a piecemeal technique using a cold biopsy forceps.       Resection and retrieval were complete.      The cardia and gastric fundus were normal on retroflexion.      The duodenal bulb, first portion of the duodenum and second portion of       the duodenum were normal. Impression:               -  Z-line irregular, 35 cm from the incisors.                           - 4 cm hiatal hernia.                           - A few gastric polyps.                           - A single gastric polyp. Resected and retrieved.                           - Normal duodenal bulb, first portion of the                            duodenum and second portion of the duodenum. Recommendation:           - Return patient to hospital ward for ongoing care.                           -  Clear liquid diet.                           - Continue present medications.                           - Perform a colonoscopy if ongoing bleeding.  Antimicrobials: None   Subjective: Patient seen and examined at bedside.  More awake this morning.  Complains of left lower extremity pain.  No overnight fever, vomiting, black or bloody bowel movements reported.  Objective: Vitals:   04/30/20 1619 04/30/20 1937 05/01/20 0404 05/01/20 0408  BP: 132/72 117/68 137/70   Pulse: 94 98 95   Resp: 16 20 20    Temp: 99.4 F (37.4 C) 99.2 F (37.3 C) 98.4 F (36.9 C)   TempSrc: Axillary Oral Oral   SpO2: 97% 95% 95%   Weight:    70.4 kg  Height:        Intake/Output Summary (Last 24 hours) at 05/01/2020 0737 Last data filed at 05/01/2020 0600 Gross per 24 hour  Intake 783.33 ml  Output 200 ml  Net 583.33 ml   Filed Weights   04/28/20 0422 04/29/20 0447 05/01/20 0408  Weight: 75.5 kg 75.2 kg 70.4 kg    Examination:  General exam:  No acute distress.  More awake this morning.  Slightly hard of hearing.  Elderly female lying in bed.   Respiratory system: Bilateral decreased breath sounds at bases with some crackles, no wheezing  cardiovascular system: Rate controlled, S1-S2 heard Gastrointestinal system: Abdomen is nondistended, soft and nontender.  Bowel sounds are heard  extremities: Trace bilateral lower extremity edema present.  No clubbing.  Left ankle has a CAM boot    Data Reviewed: I have personally reviewed following  labs and imaging studies  CBC: Recent Labs  Lab 04/26/20 0236 04/26/20 0853 04/27/20 0528 04/27/20 0528 04/28/20 0810 04/29/20 0434 04/30/20 0429 04/30/20 2027 05/01/20 0504  WBC 9.9   < > 9.2  --  8.5 7.4 7.2  --  7.6  NEUTROABS 7.4  --   --   --  5.5 4.0 3.9  --  4.8  HGB 7.2*   < > 8.5*   < > 8.8* 8.6* 8.0* 9.8* 9.2*  HCT 23.4*   < > 26.9*   < > 27.5* 26.4* 25.3* 31.0* 28.9*  MCV 91.8   < > 90.3  --  88.1 89.2 90.4  --  88.7  PLT 231   < > 206  --  243 235 234  --  208   < > = values in this interval not displayed.   Basic Metabolic Panel: Recent Labs  Lab 04/27/20 0528 04/28/20 0810 04/29/20 0434 04/30/20 0429 05/01/20 0504  NA 140 136 139 140 140  K 4.2 3.6 3.8 3.5 3.5  CL 108 101 103 103 104  CO2 25 24 27 29 26   GLUCOSE 111* 187* 112* 120* 142*  BUN 16 12 9 9 12   CREATININE 0.82 1.09* 1.08* 1.08* 1.10*  CALCIUM 8.2* 8.8* 8.7* 8.4* 8.2*  MG  --  1.9 2.0 2.0 2.3   GFR: Estimated Creatinine Clearance: 30.1 mL/min (A) (by C-G formula based on SCr of 1.1 mg/dL (H)). Liver Function Tests: Recent Labs  Lab 04/26/20 0236 04/26/20 0853 04/27/20 0528  AST 21 25 24   ALT 16 14 15   ALKPHOS 57 57 60  BILITOT 0.7 2.7* 1.6*  PROT  6.0* 5.8* 5.6*  ALBUMIN 3.3* 3.2* 3.1*   No results for input(s): LIPASE, AMYLASE in the last 168 hours. No results for input(s): AMMONIA in the last 168 hours. Coagulation Profile: Recent Labs  Lab 04/26/20 0236  INR 1.0   Cardiac Enzymes: No results for input(s): CKTOTAL, CKMB, CKMBINDEX, TROPONINI in the last 168 hours. BNP (last 3 results) No results for input(s): PROBNP in the last 8760 hours. HbA1C: No results for input(s): HGBA1C in the last 72 hours. CBG: Recent Labs  Lab 04/30/20 0641 04/30/20 1101 04/30/20 1609 04/30/20 2122 05/01/20 0636  GLUCAP 129* 148* 125* 182* 142*   Lipid Profile: No results for input(s): CHOL, HDL, LDLCALC, TRIG, CHOLHDL, LDLDIRECT in the last 72 hours. Thyroid Function Tests: No  results for input(s): TSH, T4TOTAL, FREET4, T3FREE, THYROIDAB in the last 72 hours. Anemia Panel: No results for input(s): VITAMINB12, FOLATE, FERRITIN, TIBC, IRON, RETICCTPCT in the last 72 hours. Sepsis Labs: No results for input(s): PROCALCITON, LATICACIDVEN in the last 168 hours.  Recent Results (from the past 240 hour(s))  SARS Coronavirus 2 by RT PCR (hospital order, performed in [facility] hospital lab) Nasopharyngeal Nasopharyngeal Swab     Status: None   Collection Time: 04/26/20  1:54 AM   Specimen: Nasopharyngeal Swab  Result Value Ref Range Status   SARS Coronavirus 2 NEGATIVE NEGATIVE Final    Comment: (NOTE) SARS-CoV-2 target nucleic acids are NOT DETECTED.  The SARS-CoV-2 RNA is generally detectable in upper and lower respiratory specimens during the acute phase of infection. The lowest concentration of SARS-CoV-2 viral copies this assay can detect is 250 copies / mL. A negative result does not preclude SARS-CoV-2 infection and should not be used as the sole basis for treatment or other patient management decisions.  A negative result may occur with improper specimen collection / handling, submission of specimen other than nasopharyngeal swab, presence of viral mutation(s) within the areas targeted by this assay, and inadequate number of viral copies (<250 copies / mL). A negative result must be combined with clinical observations, patient history, and epidemiological information.  Fact Sheet for Patients:   StrictlyIdeas.no  Fact Sheet for Healthcare Providers: BankingDealers.co.za  This test is not yet approved or  cleared by the [place] FDA and has been authorized for detection and/or diagnosis of SARS-CoV-2 by FDA under an Emergency Use Authorization (EUA).  This EUA will remain in effect (meaning this test can be used) for the duration of the COVID-19 declaration under Section 564(b)(1) of the Act, 21  U.S.C. section 360bbb-3(b)(1), unless the authorization is terminated or revoked sooner.  Performed at [facility], [address] [place], [place] [address]          Radiology Studies: DG Ankle 2 Views Left  Result Date: 04/30/2020 CLINICAL DATA:  [place] [address]. Injured left ankle. EXAM: LEFT ANKLE - 2 VIEW COMPARISON:  None. FINDINGS: There is an oblique coursing relatively nondisplaced fracture involving the distal fibular shaft well above the ankle mortise. No fracture of the tibia is identified. The mid and hindfoot bony structures are intact. Calcaneal spurring changes are noted. IMPRESSION: Relatively nondisplaced distal fibular shaft fracture. Electronically Signed   By: Marijo Sanes M.D.   On: 04/30/2020 08:12   NM GI Blood Loss  Result Date: 04/29/2020 CLINICAL DATA:  GI bleed EXAM: NUCLEAR MEDICINE GASTROINTESTINAL BLEEDING SCAN TECHNIQUE: Sequential abdominal images were obtained following intravenous administration of Tc-59mlabeled red blood cells. RADIOPHARMACEUTICALS:  24.6 mCi Tc-978mertechnetate in-vitro labeled red cells. COMPARISON:  CT 04/28/2020 FINDINGS: Imaging was performed for 2 hours. There is no radiotracer accumulation to suggest or localize active GI bleed. IMPRESSION: No visible active GI bleed. Electronically Signed   By: Rolm Baptise M.D.   On: 04/29/2020 16:01        Scheduled Meds: . atorvastatin  40 mg Oral Daily  . insulin aspart  0-6 Units Subcutaneous TID WC  . pantoprazole (PROTONIX) IV  40 mg Intravenous Q12H   Continuous Infusions:        Aline August, MD [facility] 05/01/2020, 7:37 AM \

## 2020-05-02 ENCOUNTER — Inpatient Hospital Stay (HOSPITAL_COMMUNITY): Payer: Medicare Other

## 2020-05-02 ENCOUNTER — Encounter (HOSPITAL_COMMUNITY): Payer: Self-pay | Admitting: Gastroenterology

## 2020-05-02 DIAGNOSIS — S82832A Other fracture of upper and lower end of left fibula, initial encounter for closed fracture: Secondary | ICD-10-CM | POA: Diagnosis not present

## 2020-05-02 DIAGNOSIS — I10 Essential (primary) hypertension: Secondary | ICD-10-CM | POA: Diagnosis not present

## 2020-05-02 DIAGNOSIS — K922 Gastrointestinal hemorrhage, unspecified: Secondary | ICD-10-CM | POA: Diagnosis not present

## 2020-05-02 DIAGNOSIS — I248 Other forms of acute ischemic heart disease: Secondary | ICD-10-CM | POA: Diagnosis not present

## 2020-05-02 LAB — CBC WITH DIFFERENTIAL/PLATELET
Abs Immature Granulocytes: 0.04 10*3/uL (ref 0.00–0.07)
Basophils Absolute: 0 10*3/uL (ref 0.0–0.1)
Basophils Relative: 0 %
Eosinophils Absolute: 0.2 10*3/uL (ref 0.0–0.5)
Eosinophils Relative: 2 %
HCT: 31 % — ABNORMAL LOW (ref 36.0–46.0)
Hemoglobin: 9.8 g/dL — ABNORMAL LOW (ref 12.0–15.0)
Immature Granulocytes: 1 %
Lymphocytes Relative: 20 %
Lymphs Abs: 1.7 10*3/uL (ref 0.7–4.0)
MCH: 28.5 pg (ref 26.0–34.0)
MCHC: 31.6 g/dL (ref 30.0–36.0)
MCV: 90.1 fL (ref 80.0–100.0)
Monocytes Absolute: 0.9 10*3/uL (ref 0.1–1.0)
Monocytes Relative: 10 %
Neutro Abs: 5.7 10*3/uL (ref 1.7–7.7)
Neutrophils Relative %: 67 %
Platelets: 207 10*3/uL (ref 150–400)
RBC: 3.44 MIL/uL — ABNORMAL LOW (ref 3.87–5.11)
RDW: 17 % — ABNORMAL HIGH (ref 11.5–15.5)
WBC: 8.5 10*3/uL (ref 4.0–10.5)
nRBC: 0 % (ref 0.0–0.2)

## 2020-05-02 LAB — BASIC METABOLIC PANEL
Anion gap: 10 (ref 5–15)
BUN: 13 mg/dL (ref 8–23)
CO2: 25 mmol/L (ref 22–32)
Calcium: 8.1 mg/dL — ABNORMAL LOW (ref 8.9–10.3)
Chloride: 104 mmol/L (ref 98–111)
Creatinine, Ser: 1.07 mg/dL — ABNORMAL HIGH (ref 0.44–1.00)
GFR calc Af Amer: 51 mL/min — ABNORMAL LOW (ref >60)
GFR calc non Af Amer: 44 mL/min — ABNORMAL LOW (ref >60)
Glucose, Bld: 158 mg/dL — ABNORMAL HIGH (ref 70–99)
Potassium: 3.6 mmol/L (ref 3.5–5.1)
Sodium: 139 mmol/L (ref 135–145)

## 2020-05-02 LAB — GLUCOSE, CAPILLARY
Glucose-Capillary: 145 mg/dL — ABNORMAL HIGH (ref 70–99)
Glucose-Capillary: 176 mg/dL — ABNORMAL HIGH (ref 70–99)
Glucose-Capillary: 146 mg/dL — ABNORMAL HIGH (ref 70–99)
Glucose-Capillary: 159 mg/dL — ABNORMAL HIGH (ref 70–99)

## 2020-05-02 LAB — MAGNESIUM: Magnesium: 2.2 mg/dL (ref 1.7–2.4)

## 2020-05-02 MED ORDER — PANTOPRAZOLE SODIUM 40 MG PO TBEC: 40.0000 mg | DELAYED_RELEASE_TABLET | Freq: Two times a day (BID) | ORAL | Status: DC

## 2020-05-02 MED ORDER — ASPIRIN 81 MG PO CHEW
81.0000 mg | CHEWABLE_TABLET | Freq: Every day | ORAL | Status: DC
  Administered 2020-05-02 – 2020-05-07 (×6): 81 mg via ORAL
  Filled 2020-05-02 (×6): qty 1

## 2020-05-02 MED ORDER — METOPROLOL TARTRATE 12.5 MG HALF TABLET
12.5000 mg | ORAL_TABLET | Freq: Two times a day (BID) | ORAL | Status: DC
  Administered 2020-05-02 – 2020-05-07 (×11): 12.5 mg via ORAL
  Filled 2020-05-02 (×11): qty 1

## 2020-05-02 MED ORDER — PANTOPRAZOLE SODIUM 40 MG PO TBEC
40.0000 mg | DELAYED_RELEASE_TABLET | Freq: Every day | ORAL | Status: DC
  Administered 2020-05-03 – 2020-05-07 (×5): 40 mg via ORAL
  Filled 2020-05-02 (×5): qty 1

## 2020-05-02 NOTE — Progress Notes (Signed)
Patient ID: Denise Parrish, female   DOB: 14-Oct-1922, 86 y.o.   MRN: [id number]  PROGRESS NOTE    Denise Parrish  DXA:[id number] DOB: 1922-11-24 DOA: 04/26/2020 PCP: [name], MD   Brief Narrative:  86 year old female with history of palpitations, minimal CAD on cath in 2005, moderate to severe AS on echo in 09/27/2019 with normal LVEF 60 to 65% and grade 1 diastolic dysfunction, hypertension, diabetes mellitus type 2, hyperlipidemia, unspecified CVA in January 2021 and TIA in July 2021 for which she was on aspirin and Plavix, status post CEA on 10/21/2019 was admitted on 04/26/2020 with weakness and melena.  Hemoglobin was 7.2 on admission for which she required 1 unit of packed red cell transfusion.  GI and cardiology were consulted.  She was transferred to [facility] for further GI work-up as anesthesia at [facility] did not feel comfortable with providing anesthesia at that facility.  Assessment & Plan:  Acute blood loss anemia Probable upper GI bleeding -Patient presented with hemoglobin of 7.2, hemoglobin was 11.9 on 04/07/2020.  Status post 2units of packed red cell transfusion during the hospitalization, last transfusion was on 04/30/2020.  Hemoglobin 9.8 today.  Monitor H&H. -Currently on Protonix 40 mg IV every 12 hours.   -GI following.  Status post EGD on 04/28/2020 which showed no evidence of active bleeding; 1 medium size gastric polyp removed with biopsy forceps.  CT of the abdomen and pelvis done subsequently showed severe sigmoid diverticulosis and cholelithiasis but no acute intra-abdominal or pelvic pathology.  Tagged red cell scan on 04/29/2020 as per GI did not show any evidence of active bleeding -Capsule endoscopy done on 04/30/2020 was a limited study as capsule remained in the stomach for more than 11 hours Limited evaluation of the duodenum showed no active bleeding: GI discussed this with the family who did not want any further investigation or work-up.   GI has signed off and cleared the patient to be started on antiplatelets. -Switch IV Protonix to oral once a day.  Unspecified CVA in January 2021 and TIA in July 2021 Recent right CEA on 2-21 for symptomatic right carotid artery stenosis -Patient was on aspirin 325 mg and Plavix 75 mg prior to presentation.  Patient has been resumed on aspirin 81 mg daily.  Cardiology recommends only continue aspirin 81 mg for now and hold off on Plavix till reevaluation as an outpatient. -Lipitor has been resumed.  Elevated troponin -Most likely due to demand ischemia.  Cardiology following and recommending medical management.  Echo with preserved EF of 60 to 65% with grade 2 diastolic dysfunction with moderately elevated pulmonary artery systolic pressure. -Cardiology has signed off today.  Outpatient follow-up.  She has been restarted on metoprolol 12.5 mg twice a day by cardiology today.  Syncope -Patient had a syncopal episode at assisted fall while ambulating from bathroom with nurse tech.  She got really hot and weak and went down to her knees similar did not hit her head.  She was given 250 cc normal saline because her blood pressure was in the 90s.   -Metoprolol plan as above.  Nondisplaced left distal fibular shaft fracture -Patient started having left ankle pain after syncopal episode/fall.  X-ray shows findings as above. -Orthopedics following and patient being managed conservatively at this time with weightbearing as tolerated in  CAM boot.  Repeat x-ray this morning does not show any displacement.  Orthopedic/Dr. Doreatha Martin following. - will get PT reevaluation as patient probably can be  discharged home tomorrow.    Aortic stenosis -Moderate as per cardiology documentation.  Chronic diastolic heart failure -Compensated.  Echo as above.  Strict input output.  Daily weights.  Cardiology following.  Hypertension -Blood pressure stable.  She has been resumed on oral metoprolol by  cardiology.  Diabetes mellitus type 2 -A1c 7.  Amaryl on hold -Continue CBGs with SSI  Anxiety disorder -Lorazepam as needed  Generalized conditioning -PT recommends home health PT.  Palliative care evaluated the patient during this hospitalization.  Patient remains full code.   DVT prophylaxis: SCDs Code Status: Full Family Communication: Spoke to daughter at bedside on 04/30/2020 Disposition Plan: Status is: Inpatient  Remains inpatient appropriate because:IV treatments appropriate due to intensity of illness or inability to take PO   Dispo:  Patient From: Home  Planned Disposition: Home  expected discharge date: 05/03/2020  Medically stable for discharge: No    Consultants: GI/cardiology/palliative care  Procedures:  Echo 1. Left ventricular ejection fraction, by estimation, is 60 to 65%. The  left ventricle has normal function. The left ventricle has no regional  wall motion abnormalities. There is mild left ventricular hypertrophy.  Left ventricular diastolic parameters  are consistent with Grade II diastolic dysfunction (pseudonormalization).  Elevated left atrial pressure.  2. Right ventricular systolic function is normal. The right ventricular  size is normal. There is moderately elevated pulmonary artery systolic  pressure.  3. The mitral valve is abnormal. Mild mitral valve regurgitation.  4. AV is thickened, calcified with restricted motion. Peak and mean  gradients through the vlave are 49 and 28 mm respectively consistent with  moderate AS. Dimensionless index is 0.37. Compareed to echo from Jan ,  gradients are decreased.. The aortic  valve is abnormal. Aortic valve regurgitation is not visualized. Mild  aortic valve sclerosis is present, with no evidence of aortic valve  stenosis.  5. The inferior vena cava is normal in size with greater than 50%  respiratory variability, suggesting right atrial pressure of 3 mmHg.   EGD on 04/28/2020 Findings:       The Z-line was irregular and was found 35 cm from the incisors.      A 4 cm hiatal hernia was present.      A few small sessile polyps were found in the cardia and in the gastric       body.      A single 7 mm sessile polyp was found in the gastric body. The polyp was       removed with a piecemeal technique using a cold biopsy forceps.       Resection and retrieval were complete.      The cardia and gastric fundus were normal on retroflexion.      The duodenal bulb, first portion of the duodenum and second portion of       the duodenum were normal. Impression:               - Z-line irregular, 35 cm from the incisors.                           - 4 cm hiatal hernia.                           - A few gastric polyps.                           -  A single gastric polyp. Resected and retrieved.                           - Normal duodenal bulb, first portion of the                            duodenum and second portion of the duodenum. Recommendation:           - Return patient to hospital ward for ongoing care.                           - Clear liquid diet.                           - Continue present medications.                           - Perform a colonoscopy if ongoing bleeding.  Antimicrobials: None   Subjective: Patient seen and examined at bedside.  Patient denies any worsening left foot pain.  Denies worsening shortness of breath, chest pain, black or bloody bowel movements.  Feels that she will be ready to go home tomorrow if she does okay with physical therapy today.   Objective: Vitals:   05/01/20 1228 05/01/20 1926 05/02/20 0357 05/02/20 1006  BP: 131/63 (!) 112/47 120/60 (!) 130/57  Pulse: 80 76 73 78  Resp: 18 20 20    Temp: 98.5 F (36.9 C) 98.3 F (36.8 C) 97.9 F (36.6 C)   TempSrc: Oral Oral Oral   SpO2: 97% 99% 100%   Weight:   77.9 kg   Height:        Intake/Output Summary (Last 24 hours) at 05/02/2020 1032 Last data filed at 05/02/2020 0900 Gross per 24  hour  Intake 720 ml  Output 501 ml  Net 219 ml   Filed Weights   04/29/20 0447 05/01/20 0408 05/02/20 0357  Weight: 75.2 kg 70.4 kg 77.9 kg    Examination:  General exam: No distress.  Slightly hard of hearing.  Elderly female lying in bed.   Respiratory system: Bilateral decreased breath sounds at bases, no wheezing cardiovascular system: S1-S2 heard, rate controlled Gastrointestinal system: Abdomen is nondistended, soft and nontender.  Normal bowel sounds heard  extremities: No cyanosis.  Mild lower extremity edema present.  Left ankle has a CAM boot    Data Reviewed: I have personally reviewed following labs and imaging studies  CBC: Recent Labs  Lab 04/28/20 0810 04/28/20 0810 04/29/20 0434 04/30/20 0429 04/30/20 2027 05/01/20 0504 05/02/20 0703  WBC 8.5  --  7.4 7.2  --  7.6 8.5  NEUTROABS 5.5  --  4.0 3.9  --  4.8 5.7  HGB 8.8*   < > 8.6* 8.0* 9.8* 9.2* 9.8*  HCT 27.5*   < > 26.4* 25.3* 31.0* 28.9* 31.0*  MCV 88.1  --  89.2 90.4  --  88.7 90.1  PLT 243  --  235 234  --  208 207   < > = values in this interval not displayed.   Basic Metabolic Panel: Recent Labs  Lab 04/28/20 0810 04/29/20 0434 04/30/20 0429 05/01/20 0504 05/02/20 0703  NA 136 139 140 140 139  K 3.6 3.8 3.5 3.5 3.6  CL 101 103 103 104 104  CO2 24  27 29 26 25   GLUCOSE 187* 112* 120* 142* 158*  BUN 12 9 9 12 13   CREATININE 1.09* 1.08* 1.08* 1.10* 1.07*  CALCIUM 8.8* 8.7* 8.4* 8.2* 8.1*  MG 1.9 2.0 2.0 2.3 2.2   GFR: Estimated Creatinine Clearance: 32.5 mL/min (A) (by C-G formula based on SCr of 1.07 mg/dL (H)). Liver Function Tests: Recent Labs  Lab 04/26/20 0236 04/26/20 0853 04/27/20 0528  AST 21 25 24   ALT 16 14 15   ALKPHOS 57 57 60  BILITOT 0.7 2.7* 1.6*  PROT 6.0* 5.8* 5.6*  ALBUMIN 3.3* 3.2* 3.1*   No results for input(s): LIPASE, AMYLASE in the last 168 hours. No results for input(s): AMMONIA in the last 168 hours. Coagulation Profile: Recent Labs  Lab  04/26/20 0236  INR 1.0   Cardiac Enzymes: No results for input(s): CKTOTAL, CKMB, CKMBINDEX, TROPONINI in the last 168 hours. BNP (last 3 results) No results for input(s): PROBNP in the last 8760 hours. HbA1C: No results for input(s): HGBA1C in the last 72 hours. CBG: Recent Labs  Lab 04/30/20 2122 05/01/20 0636 05/01/20 1652 05/01/20 2122 05/02/20 0602  GLUCAP 182* 142* 155* 184* 145*   Lipid Profile: No results for input(s): CHOL, HDL, LDLCALC, TRIG, CHOLHDL, LDLDIRECT in the last 72 hours. Thyroid Function Tests: No results for input(s): TSH, T4TOTAL, FREET4, T3FREE, THYROIDAB in the last 72 hours. Anemia Panel: No results for input(s): VITAMINB12, FOLATE, FERRITIN, TIBC, IRON, RETICCTPCT in the last 72 hours. Sepsis Labs: No results for input(s): PROCALCITON, LATICACIDVEN in the last 168 hours.  Recent Results (from the past 240 hour(s))  SARS Coronavirus 2 by RT PCR (hospital order, performed in [facility] hospital lab) Nasopharyngeal Nasopharyngeal Swab     Status: None   Collection Time: 04/26/20  1:54 AM   Specimen: Nasopharyngeal Swab  Result Value Ref Range Status   SARS Coronavirus 2 NEGATIVE NEGATIVE Final    Comment: (NOTE) SARS-CoV-2 target nucleic acids are NOT DETECTED.  The SARS-CoV-2 RNA is generally detectable in upper and lower respiratory specimens during the acute phase of infection. The lowest concentration of SARS-CoV-2 viral copies this assay can detect is 250 copies / mL. A negative result does not preclude SARS-CoV-2 infection and should not be used as the sole basis for treatment or other patient management decisions.  A negative result may occur with improper specimen collection / handling, submission of specimen other than nasopharyngeal swab, presence of viral mutation(s) within the areas targeted by this assay, and inadequate number of viral copies (<250 copies / mL). A negative result must be combined with clinical observations,  patient history, and epidemiological information.  Fact Sheet for Patients:   StrictlyIdeas.no  Fact Sheet for Healthcare Providers: BankingDealers.co.za  This test is not yet approved or  cleared by the [place] FDA and has been authorized for detection and/or diagnosis of SARS-CoV-2 by FDA under an Emergency Use Authorization (EUA).  This EUA will remain in effect (meaning this test can be used) for the duration of the COVID-19 declaration under Section 564(b)(1) of the Act, 21 U.S.C. section 360bbb-3(b)(1), unless the authorization is terminated or revoked sooner.  Performed at [facility], [address], [place], [place] [address]          Radiology Studies: DG Tibia/Fibula Left  Result Date: 05/02/2020 CLINICAL DATA:  Fibula fracture EXAM: LEFT TIBIA AND FIBULA - 2 VIEW COMPARISON:  04/30/2020 ankle FINDINGS: There is an oblique minimally displaced fracture of the distal shaft of the left fibula.  Alignment is unchanged from 04/30/2020 ankle radiograph. No additional fracture identified. IMPRESSION: Stable alignment of minimally displaced oblique fracture of the distal shaft the left fibula. Electronically Signed   By: Macy Mis M.D.   On: 05/02/2020 07:21        Scheduled Meds: . aspirin  81 mg Oral Daily  . atorvastatin  40 mg Oral Daily  . insulin aspart  0-6 Units Subcutaneous TID WC  . metoprolol tartrate  12.5 mg Oral BID  . pantoprazole (PROTONIX) IV  40 mg Intravenous Q12H   Continuous Infusions:        Aline August, MD [facility] 05/02/2020, 10:32 AM \

## 2020-05-02 NOTE — Progress Notes (Signed)
Cardiology Progress Note  Patient ID: Denise Parrish MRN: [id number] DOB: 01/06/1923 Date of Encounter: 05/02/2020  Primary Cardiologist: Dorris Carnes, MD  Subjective   Chief Complaint: Left leg pain  HPI: Capsule endoscopy negative.  GI okay to restart antiplatelet agents.  Reports left leg pain due to ankle fracture.  ROS:  All other ROS reviewed and negative. Pertinent positives noted in the HPI.     Inpatient Medications  Scheduled Meds: . aspirin  81 mg Oral Daily  . atorvastatin  40 mg Oral Daily  . insulin aspart  0-6 Units Subcutaneous TID WC  . pantoprazole (PROTONIX) IV  40 mg Intravenous Q12H   Continuous Infusions:  PRN Meds: acetaminophen **OR** acetaminophen, LORazepam, ondansetron **OR** ondansetron (ZOFRAN) IV   Vital Signs   Vitals:   05/01/20 0408 05/01/20 1228 05/01/20 1926 05/02/20 0357  BP:  131/63 (!) 112/47 120/60  Pulse:  80 76 73  Resp:  18 20 20   Temp:  98.5 F (36.9 C) 98.3 F (36.8 C) 97.9 F (36.6 C)  TempSrc:  Oral Oral Oral  SpO2:  97% 99% 100%  Weight: 70.4 kg   77.9 kg  Height:        Intake/Output Summary (Last 24 hours) at 05/02/2020 0925 Last data filed at 05/02/2020 0012 Gross per 24 hour  Intake 480 ml  Output 501 ml  Net -21 ml   Last 3 Weights 05/02/2020 05/01/2020 04/29/2020  Weight (lbs) 171 lb 11.8 oz 155 lb 3.3 oz 165 lb 11.2 oz  Weight (kg) 77.9 kg 70.4 kg 75.161 kg      Telemetry  Overnight telemetry shows sinus rhythm in the 60s with PACs, which I personally reviewed.   ECG  The most recent ECG shows sinus rhythm, heart rate 90, PACs, anterolateral ST depressions, which I personally reviewed.   Physical Exam   Vitals:   05/01/20 0408 05/01/20 1228 05/01/20 1926 05/02/20 0357  BP:  131/63 (!) 112/47 120/60  Pulse:  80 76 73  Resp:  18 20 20   Temp:  98.5 F (36.9 C) 98.3 F (36.8 C) 97.9 F (36.6 C)  TempSrc:  Oral Oral Oral  SpO2:  97% 99% 100%  Weight: 70.4 kg   77.9 kg  Height:          Intake/Output Summary (Last 24 hours) at 05/02/2020 0925 Last data filed at 05/02/2020 0012 Gross per 24 hour  Intake 480 ml  Output 501 ml  Net -21 ml    Last 3 Weights 05/02/2020 05/01/2020 04/29/2020  Weight (lbs) 171 lb 11.8 oz 155 lb 3.3 oz 165 lb 11.2 oz  Weight (kg) 77.9 kg 70.4 kg 75.161 kg    Body mass index is 28.58 kg/m.   General: Well nourished, well developed, in no acute distress Head: Atraumatic, normal size  Eyes: PEERLA, EOMI  Neck: Supple, no JVD Endocrine: No thryomegaly Cardiac: Normal S1, S2; 3-6 systolic ejection murmur, early peaking Lungs: Clear to auscultation bilaterally, no wheezing, rhonchi or rales  Abd: Soft, nontender, no hepatomegaly  Ext: No edema, pulses 2+ Musculoskeletal: No deformities, BUE and BLE strength normal and equal Skin: Warm and dry, no rashes   Neuro: Alert and oriented to person, place, time, and situation, CNII-XII grossly intact, no focal deficits  Psych: Normal mood and affect   Labs  High Sensitivity Troponin:   Recent Labs  Lab 04/26/20 0236 04/26/20 0346 04/26/20 0853 04/26/20 1143  TROPONINIHS 380* 431* 1,314* 1,891*     Cardiac EnzymesNo results  for input(s): TROPONINI in the last 168 hours. No results for input(s): TROPIPOC in the last 168 hours.  Chemistry Recent Labs  Lab 04/26/20 0236 04/26/20 0236 04/26/20 5027 04/26/20 7412 04/27/20 0528 04/28/20 0810 04/30/20 0429 05/01/20 0504 05/02/20 0703  NA 139   < > 139   < > 140   < > 140 140 139  K 4.0   < > 3.8   < > 4.2   < > 3.5 3.5 3.6  CL 109   < > 109   < > 108   < > 103 104 104  CO2 22   < > 22   < > 25   < > 29 26 25   GLUCOSE 167*   < > 110*   < > 111*   < > 120* 142* 158*  BUN 28*   < > 23   < > 16   < > 9 12 13   CREATININE 0.98   < > 0.81   < > 0.82   < > 1.08* 1.10* 1.07*  CALCIUM 8.3*   < > 8.3*   < > 8.2*   < > 8.4* 8.2* 8.1*  PROT 6.0*  --  5.8*  --  5.6*  --   --   --   --   ALBUMIN 3.3*  --  3.2*  --  3.1*  --   --   --   --   AST 21  --   25  --  24  --   --   --   --   ALT 16  --  14  --  15  --   --   --   --   ALKPHOS 57  --  57  --  60  --   --   --   --   BILITOT 0.7  --  2.7*  --  1.6*  --   --   --   --   GFRNONAA 49*   < > >60   < > >60   < > 44* 43* 44*  GFRAA 57*   < > >60   < > >60   < > 51* 49* 51*  ANIONGAP 8   < > 8   < > 7   < > 8 10 10    < > = values in this interval not displayed.    Hematology Recent Labs  Lab 04/30/20 0429 04/30/20 0429 04/30/20 2027 05/01/20 0504 05/02/20 0703  WBC 7.2  --   --  7.6 8.5  RBC 2.80*  --   --  3.26* 3.44*  HGB 8.0*   < > 9.8* 9.2* 9.8*  HCT 25.3*   < > 31.0* 28.9* 31.0*  MCV 90.4  --   --  88.7 90.1  MCH 28.6  --   --  28.2 28.5  MCHC 31.6  --   --  31.8 31.6  RDW 17.4*  --   --  17.2* 17.0*  PLT 234  --   --  208 207   < > = values in this interval not displayed.   BNPNo results for input(s): BNP, PROBNP in the last 168 hours.  DDimer No results for input(s): DDIMER in the last 168 hours.   Radiology  DG Tibia/Fibula Left  Result Date: 05/02/2020 CLINICAL DATA:  Fibula fracture EXAM: LEFT TIBIA AND FIBULA - 2 VIEW COMPARISON:  04/30/2020 ankle FINDINGS: There is an oblique minimally displaced fracture of  the distal shaft of the left fibula. Alignment is unchanged from 04/30/2020 ankle radiograph. No additional fracture identified. IMPRESSION: Stable alignment of minimally displaced oblique fracture of the distal shaft the left fibula. Electronically Signed   By: Macy Mis M.D.   On: 05/02/2020 07:21    Cardiac Studies  TTE 04/26/2020 1. Left ventricular ejection fraction, by estimation, is 60 to 65%. The  left ventricle has normal function. The left ventricle has no regional  wall motion abnormalities. There is mild left ventricular hypertrophy.  Left ventricular diastolic parameters  are consistent with Grade II diastolic dysfunction (pseudonormalization).  Elevated left atrial pressure.  2. Right ventricular systolic function is normal. The right  ventricular  size is normal. There is moderately elevated pulmonary artery systolic  pressure.  3. The mitral valve is abnormal. Mild mitral valve regurgitation.  4. AV is thickened, calcified with restricted motion. Peak and mean  gradients through the vlave are 49 and 28 mm respectively consistent with  moderate AS. Dimensionless index is 0.37. Compareed to echo from Jan ,  gradients are decreased.. The aortic  valve is abnormal. Aortic valve regurgitation is not visualized. Mild  aortic valve sclerosis is present, with no evidence of aortic valve  stenosis.  5. The inferior vena cava is normal in size with greater than 50%  respiratory variability, suggesting right atrial pressure of 3 mmHg.   Patient Profile  Denise Parrish is a 86 y.o. female with moderate aortic stenosis, hypertension, CVA status post carotid endarterectomy, hyperlipidemia who was admitted on 04/26/2020 for GI bleed [facility]..Have elevated troponin.  Transferred to [facility] for EGD.  Found to have gastric polyp and severe diverticulosis.  On 04/29/2020 she sustained a fall and left ankle fracture.  Cardiology has been asked to evaluate elevated troponin.  Assessment & Plan   1.  Elevated troponin/demand ischemia in the setting of GI bleed -No chest pain.  Hemoglobin is normalized.  Echo shows normal LV function no wall motion abnormality.  Moderate AS. -Recommend medical management.  She is 86 years old and aggressive care in the setting of a recent ankle fracture is not of benefit here.  Also we did not find a strong reason for the bleed.  Therefore I believe she is high risk for rebleeding especially if this is a diverticular bleed. -I have started aspirin 81 mg daily.  We can see how she does on this.  I would not recommend DAPT as I suspect she is high risk for bleeding.  This can be addressed in the outpatient setting. -Aortic stenosis is moderate.  This is not severe.  Not severe on exam. -She is  on a statin.  I have added metoprolol tartrate 12.5 mg twice daily.  She can follow-up with cardiology as an outpatient.  2.  Moderate aortic stenosis -Vmax mean gradient in the moderate range.  Murmur on exam consistent with moderate disease.  I do not think this is the cause of her syncope or elevated troponin.  3.  Fall -Weakness upon standing in setting of anemia.  Likely orthostatic.  No arrhythmia on telemetry.  Noncardiac.  With left ankle fracture.  4.  Left ankle fracture -Ortho with nonoperative management for now.  She is on aspirin.  If this needs to be held this can be for any procedures.  [facility] will sign off.   Medication Recommendations: Aspirin 81 mg daily.  Continue statin.  Metoprolol tartrate 12.5 mg twice daily. Other recommendations (labs, testing,  etc): None Follow up as an outpatient: Please notify us when she is closer to discharge.  We will arrange hospital follow-up with Dr. Carlyle Dolly or Dr. Dorris Carnes in the [place] office.  For questions or updates, please contact [place] Please consult [email or web address] for contact info under   Time Spent with Patient: I have spent a total of 25 minutes with patient reviewing hospital notes, telemetry, EKGs, labs and examining the patient as well as establishing an assessment and plan that was discussed with the patient.  > 50% of time was spent in direct patient care.    Signed, Addison Naegeli. Audie Box, [place]  05/02/2020 9:25 AM

## 2020-05-03 ENCOUNTER — Encounter: Payer: Self-pay | Admitting: Gastroenterology

## 2020-05-03 ENCOUNTER — Inpatient Hospital Stay (HOSPITAL_COMMUNITY): Payer: Medicare Other

## 2020-05-03 DIAGNOSIS — K922 Gastrointestinal hemorrhage, unspecified: Secondary | ICD-10-CM | POA: Diagnosis not present

## 2020-05-03 DIAGNOSIS — I248 Other forms of acute ischemic heart disease: Secondary | ICD-10-CM | POA: Diagnosis not present

## 2020-05-03 DIAGNOSIS — S82832A Other fracture of upper and lower end of left fibula, initial encounter for closed fracture: Secondary | ICD-10-CM | POA: Diagnosis not present

## 2020-05-03 DIAGNOSIS — I10 Essential (primary) hypertension: Secondary | ICD-10-CM | POA: Diagnosis not present

## 2020-05-03 LAB — CBC WITH DIFFERENTIAL/PLATELET
Abs Immature Granulocytes: 0.04 10*3/uL (ref 0.00–0.07)
Basophils Absolute: 0 10*3/uL (ref 0.0–0.1)
Basophils Relative: 0 %
Eosinophils Absolute: 0.2 10*3/uL (ref 0.0–0.5)
Eosinophils Relative: 2 %
HCT: 30.4 % — ABNORMAL LOW (ref 36.0–46.0)
Hemoglobin: 9.6 g/dL — ABNORMAL LOW (ref 12.0–15.0)
Immature Granulocytes: 1 %
Lymphocytes Relative: 17 %
Lymphs Abs: 1.5 10*3/uL (ref 0.7–4.0)
MCH: 28.3 pg (ref 26.0–34.0)
MCHC: 31.6 g/dL (ref 30.0–36.0)
MCV: 89.7 fL (ref 80.0–100.0)
Monocytes Absolute: 0.8 10*3/uL (ref 0.1–1.0)
Monocytes Relative: 9 %
Neutro Abs: 6.2 10*3/uL (ref 1.7–7.7)
Neutrophils Relative %: 71 %
Platelets: 222 10*3/uL (ref 150–400)
RBC: 3.39 MIL/uL — ABNORMAL LOW (ref 3.87–5.11)
RDW: 16.5 % — ABNORMAL HIGH (ref 11.5–15.5)
WBC: 8.7 10*3/uL (ref 4.0–10.5)
nRBC: 0 % (ref 0.0–0.2)

## 2020-05-03 LAB — GLUCOSE, CAPILLARY
Glucose-Capillary: 152 mg/dL — ABNORMAL HIGH (ref 70–99)
Glucose-Capillary: 145 mg/dL — ABNORMAL HIGH (ref 70–99)
Glucose-Capillary: 152 mg/dL — ABNORMAL HIGH (ref 70–99)
Glucose-Capillary: 162 mg/dL — ABNORMAL HIGH (ref 70–99)
Glucose-Capillary: 148 mg/dL — ABNORMAL HIGH (ref 70–99)

## 2020-05-03 LAB — BASIC METABOLIC PANEL
Anion gap: 9 (ref 5–15)
BUN: 18 mg/dL (ref 8–23)
CO2: 25 mmol/L (ref 22–32)
Calcium: 8.2 mg/dL — ABNORMAL LOW (ref 8.9–10.3)
Chloride: 104 mmol/L (ref 98–111)
Creatinine, Ser: 1.16 mg/dL — ABNORMAL HIGH (ref 0.44–1.00)
GFR calc Af Amer: 46 mL/min — ABNORMAL LOW (ref >60)
GFR calc non Af Amer: 40 mL/min — ABNORMAL LOW (ref >60)
Glucose, Bld: 171 mg/dL — ABNORMAL HIGH (ref 70–99)
Potassium: 3.9 mmol/L (ref 3.5–5.1)
Sodium: 138 mmol/L (ref 135–145)

## 2020-05-03 LAB — MAGNESIUM: Magnesium: 2.1 mg/dL (ref 1.7–2.4)

## 2020-05-03 MED ORDER — FUROSEMIDE 10 MG/ML IJ SOLN
40.0000 mg | Freq: Once | INTRAMUSCULAR | Status: AC
  Administered 2020-05-03: 40 mg via INTRAVENOUS
  Filled 2020-05-03: qty 4

## 2020-05-03 NOTE — Progress Notes (Signed)
Physical Therapy Treatment Patient Details Name: Denise Parrish MRN: [id number] DOB: [birth year] Today's Date: 05/03/2020    History of Present Illness 86 y.o. female was admitted after 5 days of weakness and SOB, demand ischemia and divertiulosis causing GI bleed.  Pt was noted to have cholelithiasis, aortic stenosis.  Fractured left ankle while in hospital but nonoperative treatment per Ortho.  PMHx:  CVA, carotid stenosis, DM, GERD, HTN, facial weakness, atherosclerosis, anxiety, chest pain, heart murmur, emphysema, PNA, osteoporosis, cognitive impairment, R THA    PT Comments    Pt admitted with above diagnosis. Pt was able to ambulate recliner to bed to get back into bed as she was tired from being up. Daughter present and was asking if a stand up walker would be good for pt. This PT discussed with pt and daughter that those are new and this PT does not have experience with that piece of equipment. If pt/daughter choose to get that, a PT in the home should train her to use it. Pt does have RW with 2 wheels and did well with that today and pt and daughter are in agreement to use that for now.  Also discussed buying a pulse oximeter. Discussed home O2 with daughter as well.  Showed daughter how to don/doff both ankle braces and showed her how to use gait belt and issued gait belt.  They had no further questions and daughter states she will be with pt or someone will for several weeks.  Pt currently with functional limitations due to the deficits listed below (see PT Problem List). Pt will benefit from skilled PT to increase their independence and safety with mobility to allow discharge to the venue listed below.     Follow Up Recommendations  Home health PT;Supervision/Assistance - 24 hour (HHOT)     Equipment Recommendations   (pulse oximeter, ? stand up walker, home O2)    Recommendations for Other Services       Precautions / Restrictions Precautions Precautions: Fall Precaution  Comments: O2 via cannula Required Braces or Orthoses: Other Brace (CAM boot left LE) Restrictions Weight Bearing Restrictions: Yes LLE Weight Bearing: Weight bearing as tolerated    Mobility  Bed Mobility Overal bed mobility: Needs Assistance Bed Mobility: Supine to Sit;Sit to Supine       Sit to supine: Min assist   General bed mobility comments: A little assist to get back into bed. daughter present  Transfers Overall transfer level: Needs assistance Equipment used: Rolling walker (2 wheeled);1 person hand held assist Transfers: Sit to/from Stand Sit to Stand: Min guard Stand pivot transfers: Min guard       General transfer comment: slightly increased time, cues for hand placement and steadying assist   Ambulation/Gait Ambulation/Gait assistance: Min guard Gait Distance (Feet): 5 Feet Assistive device: Rolling walker (2 wheeled) Gait Pattern/deviations: Decreased weight shift to left;Step-through pattern;Wide base of support;Decreased stance time - left Gait velocity: decreased Gait velocity interpretation: <1.31 ft/sec, indicative of household ambulator General Gait Details: Pt able to take pivotal steps from recliner to bed with min guard assist without LOB with pt needing cues for sequencing with new CAM boot in place. Pt desats on RA at rest and needs 3L at rest and 6L with activity to maintain sats.     Stairs             Wheelchair Mobility    Modified Rankin (Stroke Patients Only)       Balance Overall balance assessment: No apparent  balance deficits (not formally assessed)                                          Cognition Arousal/Alertness: Awake/alert Behavior During Therapy: WFL for tasks assessed/performed Overall Cognitive Status: Within Functional Limits for tasks assessed                                        Exercises      General Comments        Pertinent Vitals/Pain Pain Assessment:  Faces Faces Pain Scale: Hurts whole lot Pain Location: left foot Pain Descriptors / Indicators: Aching;Discomfort;Grimacing;Guarding Pain Intervention(s): Limited activity within patient's tolerance;Monitored during session;Repositioned    Home Living                      Prior Function            PT Goals (current goals can now be found in the care plan section) Acute Rehab PT Goals Patient Stated Goal: return home with family to assist Progress towards PT goals: Progressing toward goals    Frequency    Min 5X/week      PT Plan Current plan remains appropriate    Co-evaluation              AM-PAC PT "6 Clicks" Mobility   Outcome Measure  Help needed turning from your back to your side while in a flat bed without using bedrails?: None Help needed moving from lying on your back to sitting on the side of a flat bed without using bedrails?: A Little Help needed moving to and from a bed to a chair (including a wheelchair)?: A Little Help needed standing up from a chair using your arms (e.g., wheelchair or bedside chair)?: A Little Help needed to walk in hospital room?: A Little Help needed climbing 3-5 steps with a railing? : A Little 6 Click Score: 19    End of Session Equipment Utilized During Treatment: Gait belt;Oxygen Activity Tolerance: Patient limited by fatigue Patient left: in bed;with bed alarm set;with call bell/phone within reach;with family/visitor present Nurse Communication: Mobility status PT Visit Diagnosis: Unsteadiness on feet (R26.81);Other abnormalities of gait and mobility (R26.89);Muscle weakness (generalized) (M62.81)     Time: 4656-8127 PT Time Calculation (min) (ACUTE ONLY): 28 min  Charges:  $Gait Training: 8-22 mins $Self Care/Home Management: 8-22                     Cortasia Screws W,PT [place] Pager:  [phone]  Office:  [place] 05/03/2020, 4:16 PM

## 2020-05-03 NOTE — Progress Notes (Signed)
SATURATION QUALIFICATIONS: (This note is used to comply with regulatory documentation for home oxygen)  Patient Saturations on Room Air at Rest = 84%  Patient Saturations on Room Air while Ambulating = NT as sats on RA at rest 84%  Patient Saturations on 6 Liters of oxygen while Ambulating = 91%  Please briefly explain why patient needs home oxygen:Pt needs 3L at rest and 6L with activity to maintain sats >90%.  Ruble Buttler W,PT Acute Rehabilitation Services Pager:  [phone]  Office:  [phone]

## 2020-05-03 NOTE — Progress Notes (Signed)
Patient ID: Denise Parrish, female   DOB: 06/10/1923, 86 y.o.   MRN: [id number]  PROGRESS NOTE    Denise Parrish  UDJ:[id number] DOB: [birth year] DOA: 04/26/2020 PCP: [name], MD   Brief Narrative:  86 year old female with history of palpitations, minimal CAD on cath in 2005, moderate to severe AS on echo in 09/27/2019 with normal LVEF 60 to 65% and grade 1 diastolic dysfunction, hypertension, diabetes mellitus type 2, hyperlipidemia, unspecified CVA in January 2021 and TIA in July 2021 for which she was on aspirin and Plavix, status post CEA on 10/21/2019 was admitted on 04/26/2020 with weakness and melena.  Hemoglobin was 7.2 on admission for which she required 1 unit of packed red cell transfusion.  GI and cardiology were consulted.  She was transferred to [facility] for further GI work-up as anesthesia at [facility] did not feel comfortable with providing anesthesia at that facility.  Assessment & Plan:  Acute blood loss anemia Probable upper GI bleeding -Patient presented with hemoglobin of 7.2, hemoglobin was 11.9 on 04/07/2020.  Status post 2units of packed red cell transfusion during the hospitalization, last transfusion was on 04/30/2020.  Hemoglobin 9.8 today.  Monitor H&H. -Currently on Protonix 40 mg IV every 12 hours.   -GI following.  Status post EGD on 04/28/2020 which showed no evidence of active bleeding; 1 medium size gastric polyp removed with biopsy forceps.  CT of the abdomen and pelvis done subsequently showed severe sigmoid diverticulosis and cholelithiasis but no acute intra-abdominal or pelvic pathology.  Tagged red cell scan on 04/29/2020 as per GI did not show any evidence of active bleeding -Capsule endoscopy done on 04/30/2020 was a limited study as capsule remained in the stomach for more than 11 hours Limited evaluation of the duodenum showed no active bleeding: GI discussed this with the family who did not want any further investigation or work-up.   GI has signed off and cleared the patient to be started on antiplatelets. -Continue once a day oral Protonix.  Unspecified CVA in January 2021 and TIA in July 2021 Recent right CEA on 2-21 for symptomatic right carotid artery stenosis -Patient was on aspirin 325 mg and Plavix 75 mg prior to presentation.  Patient has been resumed on aspirin 81 mg daily.  Cardiology recommends only continue aspirin 81 mg for now and hold off on Plavix till reevaluation as an outpatient. -Lipitor has been resumed.  Elevated troponin -Most likely due to demand ischemia.  Cardiology following and recommending medical management.  Echo with preserved EF of 60 to 65% with grade 2 diastolic dysfunction with moderately elevated pulmonary artery systolic pressure. -Cardiology has signed off today.  Outpatient follow-up.  She has been restarted on metoprolol 12.5 mg twice a day by cardiology today.  Syncope -Patient had a syncopal episode at assisted fall while ambulating from bathroom with nurse tech.  She got really hot and weak and went down to her knees similar did not hit her head.  She was given 250 cc normal saline because her blood pressure was in the 90s.   -Metoprolol plan as above.  Nondisplaced left distal fibular shaft fracture -Patient started having left ankle pain after syncopal episode/fall.  X-ray shows findings as above. -Orthopedics following and patient being managed conservatively at this time with weightbearing as tolerated in  CAM boot.  Repeat x-ray this morning does not show any displacement.  Orthopedic/Dr. Doreatha Martin following. -PT evaluation pending.  Aortic stenosis -Moderate as per cardiology documentation.  Chronic diastolic heart failure -Compensated.  Echo as above.  Strict input output.  Daily weights.  Cardiology has signed off.  Outpatient follow-up with  Hypertension -Blood pressure stable.  She has been resumed on oral metoprolol by cardiology.  Diabetes mellitus type 2 -A1c 7.   Amaryl on hold -Continue CBGs with SSI  Anxiety disorder -Lorazepam as needed  Generalized conditioning -PT recommends home health PT.  Palliative care evaluated the patient during this hospitalization.  Patient remains full code.   DVT prophylaxis: SCDs Code Status: Full Family Communication: Spoke to daughter at bedside on 04/30/2020 Disposition Plan: Status is: Inpatient  Remains inpatient appropriate because:IV treatments appropriate due to intensity of illness or inability to take PO.  PT eval pending.   Dispo:  Patient From: Home  Planned Disposition: Home  expected discharge date: 05/04/2020  Medically stable for discharge: No    Consultants: GI/cardiology/palliative care  Procedures:  Echo 1. Left ventricular ejection fraction, by estimation, is 60 to 65%. The  left ventricle has normal function. The left ventricle has no regional  wall motion abnormalities. There is mild left ventricular hypertrophy.  Left ventricular diastolic parameters  are consistent with Grade II diastolic dysfunction (pseudonormalization).  Elevated left atrial pressure.  2. Right ventricular systolic function is normal. The right ventricular  size is normal. There is moderately elevated pulmonary artery systolic  pressure.  3. The mitral valve is abnormal. Mild mitral valve regurgitation.  4. AV is thickened, calcified with restricted motion. Peak and mean  gradients through the vlave are 49 and 28 mm respectively consistent with  moderate AS. Dimensionless index is 0.37. Compareed to echo from Jan ,  gradients are decreased.. The aortic  valve is abnormal. Aortic valve regurgitation is not visualized. Mild  aortic valve sclerosis is present, with no evidence of aortic valve  stenosis.  5. The inferior vena cava is normal in size with greater than 50%  respiratory variability, suggesting right atrial pressure of 3 mmHg.   EGD on 04/28/2020 Findings:      The Z-line was irregular  and was found 35 cm from the incisors.      A 4 cm hiatal hernia was present.      A few small sessile polyps were found in the cardia and in the gastric       body.      A single 7 mm sessile polyp was found in the gastric body. The polyp was       removed with a piecemeal technique using a cold biopsy forceps.       Resection and retrieval were complete.      The cardia and gastric fundus were normal on retroflexion.      The duodenal bulb, first portion of the duodenum and second portion of       the duodenum were normal. Impression:               - Z-line irregular, 35 cm from the incisors.                           - 4 cm hiatal hernia.                           - A few gastric polyps.                           -  A single gastric polyp. Resected and retrieved.                           - Normal duodenal bulb, first portion of the                            duodenum and second portion of the duodenum. Recommendation:           - Return patient to hospital ward for ongoing care.                           - Clear liquid diet.                           - Continue present medications.                           - Perform a colonoscopy if ongoing bleeding.  Antimicrobials: None   Subjective: Patient seen and examined at bedside.  Feels that she wants to go home today but has not worked with PT yet.  No overnight fever, vomiting, black or bloody stools reported.  Objective: Vitals:   05/02/20 2110 05/03/20 0402 05/03/20 0838 05/03/20 1134  BP: 118/70 100/60 (!) 133/57 112/72  Pulse: 92 94 81 84  Resp: 20 17  18   Temp: 98.1 F (36.7 C) 98.6 F (37 C)  (!) 97.5 F (36.4 C)  TempSrc: Oral Oral  Oral  SpO2: 97% 93% 99% 99%  Weight:  77 kg    Height:        Intake/Output Summary (Last 24 hours) at 05/03/2020 1150 Last data filed at 05/03/2020 0835 Gross per 24 hour  Intake 1800 ml  Output 1000 ml  Net 800 ml   Filed Weights   05/01/20 0408 05/02/20 0357 05/03/20 0402  Weight:  70.4 kg 77.9 kg 77 kg    Examination:  General exam: No acute distress.  Slightly hard of hearing.  Elderly female lying in bed.   Respiratory system: Bilateral decreased breath sounds at bases with some scattered crackles  cardiovascular system: Rate controlled, S1-S2 heard Gastrointestinal system: Abdomen is nondistended, soft and nontender.  Bowel sounds heard  extremities: Trace lower extremity edema present.  No clubbing.  Left ankle has a CAM boot    Data Reviewed: I have personally reviewed following labs and imaging studies  CBC: Recent Labs  Lab 04/29/20 0434 04/29/20 0434 04/30/20 0429 04/30/20 2027 05/01/20 0504 05/02/20 0703 05/03/20 0417  WBC 7.4  --  7.2  --  7.6 8.5 8.7  NEUTROABS 4.0  --  3.9  --  4.8 5.7 6.2  HGB 8.6*   < > 8.0* 9.8* 9.2* 9.8* 9.6*  HCT 26.4*   < > 25.3* 31.0* 28.9* 31.0* 30.4*  MCV 89.2  --  90.4  --  88.7 90.1 89.7  PLT 235  --  234  --  208 207 222   < > = values in this interval not displayed.   Basic Metabolic Panel: Recent Labs  Lab 04/29/20 0434 04/30/20 0429 05/01/20 0504 05/02/20 0703 05/03/20 0417  NA 139 140 140 139 138  K 3.8 3.5 3.5 3.6 3.9  CL 103 103 104 104 104  CO2 27 29 26 25 25   GLUCOSE 112* 120* 142* 158* 171*  BUN 9 9 12 13 18   CREATININE 1.08* 1.08* 1.10* 1.07* 1.16*  CALCIUM 8.7* 8.4* 8.2* 8.1* 8.2*  MG 2.0 2.0 2.3 2.2 2.1   GFR: Estimated Creatinine Clearance: 29.8 mL/min (A) (by C-G formula based on SCr of 1.16 mg/dL (H)). Liver Function Tests: Recent Labs  Lab 04/27/20 0528  AST 24  ALT 15  ALKPHOS 60  BILITOT 1.6*  PROT 5.6*  ALBUMIN 3.1*   No results for input(s): LIPASE, AMYLASE in the last 168 hours. No results for input(s): AMMONIA in the last 168 hours. Coagulation Profile: No results for input(s): INR, PROTIME in the last 168 hours. Cardiac Enzymes: No results for input(s): CKTOTAL, CKMB, CKMBINDEX, TROPONINI in the last 168 hours. BNP (last 3 results) No results for input(s):  PROBNP in the last 8760 hours. HbA1C: No results for input(s): HGBA1C in the last 72 hours. CBG: Recent Labs  Lab 05/02/20 1205 05/02/20 1725 05/02/20 2050 05/03/20 0604 05/03/20 1135  GLUCAP 176* 146* 159* 145* 152*   Lipid Profile: No results for input(s): CHOL, HDL, LDLCALC, TRIG, CHOLHDL, LDLDIRECT in the last 72 hours. Thyroid Function Tests: No results for input(s): TSH, T4TOTAL, FREET4, T3FREE, THYROIDAB in the last 72 hours. Anemia Panel: No results for input(s): VITAMINB12, FOLATE, FERRITIN, TIBC, IRON, RETICCTPCT in the last 72 hours. Sepsis Labs: No results for input(s): PROCALCITON, LATICACIDVEN in the last 168 hours.  Recent Results (from the past 240 hour(s))  SARS Coronavirus 2 by RT PCR (hospital order, performed in [facility] hospital lab) Nasopharyngeal Nasopharyngeal Swab     Status: None   Collection Time: 04/26/20  1:54 AM   Specimen: Nasopharyngeal Swab  Result Value Ref Range Status   SARS Coronavirus 2 NEGATIVE NEGATIVE Final    Comment: (NOTE) SARS-CoV-2 target nucleic acids are NOT DETECTED.  The SARS-CoV-2 RNA is generally detectable in upper and lower respiratory specimens during the acute phase of infection. The lowest concentration of SARS-CoV-2 viral copies this assay can detect is 250 copies / mL. A negative result does not preclude SARS-CoV-2 infection and should not be used as the sole basis for treatment or other patient management decisions.  A negative result may occur with improper specimen collection / handling, submission of specimen other than nasopharyngeal swab, presence of viral mutation(s) within the areas targeted by this assay, and inadequate number of viral copies (<250 copies / mL). A negative result must be combined with clinical observations, patient history, and epidemiological information.  Fact Sheet for Patients:   StrictlyIdeas.no  Fact Sheet for Healthcare  Providers: BankingDealers.co.za  This test is not yet approved or  cleared by the [place] FDA and has been authorized for detection and/or diagnosis of SARS-CoV-2 by FDA under an Emergency Use Authorization (EUA).  This EUA will remain in effect (meaning this test can be used) for the duration of the COVID-19 declaration under Section 564(b)(1) of the Act, 21 U.S.C. section 360bbb-3(b)(1), unless the authorization is terminated or revoked sooner.  Performed at [facility], [address] [place], [place] [address]          Radiology Studies: DG Tibia/Fibula Left  Result Date: 05/02/2020 CLINICAL DATA:  Fibula fracture EXAM: LEFT TIBIA AND FIBULA - 2 VIEW COMPARISON:  04/30/2020 ankle FINDINGS: There is an oblique minimally displaced fracture of the distal shaft of the left fibula. Alignment is unchanged from 04/30/2020 ankle radiograph. No additional fracture identified. IMPRESSION: Stable alignment of minimally displaced oblique fracture of the distal shaft the left fibula. Electronically Signed  By: Macy Mis M.D.   On: 05/02/2020 07:21   DG Ankle Left Port  Result Date: 05/02/2020 CLINICAL DATA:  Ankle pain. EXAM: PORTABLE LEFT ANKLE - 2 VIEW COMPARISON:  Same day tibia and fibula radiographs. FINDINGS: An obliquely oriented acute fracture of the distal fibula diaphysis is seen in unchanged alignment (Weber type C). An overlying sock obscures bony detail. Posterior and plantar calcaneal enthesophytes are noted. IMPRESSION: Unchanged alignment of the acute fracture of the distal fibula diaphysis (Weber type C). Electronically Signed   By: Zerita Boers M.D.   On: 05/02/2020 12:39        Scheduled Meds: . aspirin  81 mg Oral Daily  . atorvastatin  40 mg Oral Daily  . insulin aspart  0-6 Units Subcutaneous TID WC  . metoprolol tartrate  12.5 mg Oral BID  . pantoprazole  40 mg Oral Daily   Continuous Infusions:        Aline August,  MD [facility] 05/03/2020, 11:50 AM \

## 2020-05-03 NOTE — TOC Initial Note (Signed)
Transition of Care [facility]) - Initial/Assessment Note    Patient Details  Name: Denise Parrish MRN: [id number] Date of Birth: May 21, 1923  [facility]) CM/SW Contact:    Bartholomew Crews, RN Phone Number: [phone] 05/03/2020, 4:13 PM  Clinical Narrative:                  Spoke with patient and daughter at the bedside. PTA patient home alone. Daughter supportive. Family provides food and does not allow patient to cook. Daughter to provided transportation home in private vehicle.   Discussed recommendations for [facility] PT. Patient and daughter agreeable. Agency choice list provided. Referral accepted by [facility] for start of care on Friday.   TOC following for transition needs.   Expected Discharge Plan: [place] Barriers to Discharge: Continued Medical Work up   Patient Goals and CMS Choice Patient states their goals for this hospitalization and ongoing recovery are:: return home CMS Medicare.gov Compare Post Acute Care list provided to:: Patient Choice offered to / list presented to : Patient, Adult Children  Expected Discharge Plan and Services Expected Discharge Plan: [place]   Discharge Planning Services: CM Consult Post Acute Care Choice: Home Health, Durable Medical Equipment Living arrangements for the past 2 months: Single Family Home                           HH Arranged: PT [place]: [facility] [employer] Date [place]: 05/03/20 Time [place]: 1611 Representative spoke with at [place]: [place] Arrangements/Services Living arrangements for the past 2 months: [place] Lives with:: Self Patient language and need for interpreter reviewed:: Yes Do you feel safe going back to the place where you live?: Yes      Need for Family Participation in Patient Care: Yes (Comment) Care giver support system in place?: Yes (comment)   Criminal Activity/Legal  Involvement Pertinent to Current Situation/Hospitalization: No - Comment as needed  Activities of Daily Living Home Assistive Devices/Equipment: Wheelchair, Hearing aid, Dentures (specify type) ADL Screening (condition at time of admission) Patient's cognitive ability adequate to safely complete daily activities?: Yes Is the patient deaf or have difficulty hearing?: Yes Does the patient have difficulty seeing, even when wearing glasses/contacts?: No Does the patient have difficulty concentrating, remembering, or making decisions?: No Patient able to express need for assistance with ADLs?: Yes Does the patient have difficulty dressing or bathing?: No Independently performs ADLs?: Yes (appropriate for developmental age) Does the patient have difficulty walking or climbing stairs?: Yes Weakness of Legs: Both Weakness of Arms/Hands: Both  Permission Sought/Granted Permission sought to share information with : Family Supports Permission granted to share information with : Yes, Verbal Permission Granted        Permission granted to share info w Relationship: daughter     Emotional Assessment Appearance:: Appears stated age Attitude/Demeanor/Rapport: Engaged Affect (typically observed): Accepting Orientation: : Oriented to Self, Oriented to  Time, Oriented to Place, Oriented to Situation Alcohol / Substance Use: Not Applicable Psych Involvement: No (comment)  Admission diagnosis:  Acute GI bleeding [K92.2] Patient Active Problem List   Diagnosis Date Noted  . Demand ischemia ([place]) 04/28/2020  . Goals of care, counseling/discussion   . Palliative care by specialist   . Acute GI bleeding 04/26/2020  . Focal neurological deficit 04/07/2020  . CVA (cerebral vascular accident) ([place]) 10/17/2019  .  TIA (transient ischemic attack) 10/16/2019  . Slurred speech 10/16/2019  . Facial droop 10/16/2019  . Essential hypertension 10/16/2019  . Diabetes ([place]) 10/16/2019  . Stroke ([place])  10/16/2019  . RUQ pain   . Dehydration   . Ileus ([place]) 12/12/2017  . Enteritis   . Right upper lobe pneumonia 12/08/2017  . Sepsis ([place]) 12/08/2017  . Lactic acidosis 12/08/2017  . Anxiety 12/08/2017  . Dehydration fever 12/08/2017  . Segmental ileitis of small intestine ([place]) 06/16/2015  . Diverticulosis of colon without hemorrhage 05/25/2015  . SBO (small bowel obstruction) ([place]) 05/23/2015  . Nausea and vomiting in adult   . Nausea vomiting and diarrhea 09/03/2013  . Vomiting 09/03/2013  . Transaminitis 09/03/2013  . Left leg weakness 10/14/2012  . Difficulty in walking(719.7) 10/14/2012  . Small bowel lesion 07/09/2012  . Heme positive stool 03/14/2012  . GERD (gastroesophageal reflux disease) 03/14/2012  . Diarrhea 03/14/2012  . Right carotid bruit 11/01/2011  . Anemia, iron deficiency 10/28/2011  . Palpitations 10/16/2011  . Diabetes mellitus, type 2 ([place])   . Hypertension   . Hyperlipidemia   . Obesity   . Aortic sclerosis    PCP:  [name], MD Pharmacy:   [place] [place] [address] Phone: [phone] Fax: [phone]     Social Determinants of Health (SDOH) Interventions    Readmission Risk Interventions Readmission Risk Prevention Plan 10/22/2019  Post Dischage Appt Complete  Medication Screening Complete  Transportation Screening Complete  Some recent data might be hidden

## 2020-05-03 NOTE — Progress Notes (Addendum)
Physical Therapy Treatment Patient Details Name: Denise Parrish MRN: [id number] DOB: 1922/11/23 Today's Date: 05/03/2020    History of Present Illness 86 y.o. female was admitted after 5 days of weakness and SOB, demand ischemia and divertiulosis causing GI bleed.  Pt was noted to have cholelithiasis, aortic stenosis.  Fractured left ankle while in hospital but nonoperative treatment per Ortho.  PMHx:  CVA, carotid stenosis, DM, GERD, HTN, facial weakness, atherosclerosis, anxiety, chest pain, heart murmur, emphysema, PNA, osteoporosis, cognitive impairment, R THA    PT Comments    Pt admitted with above diagnosis. Pt needs min assist to come to eOB and min guard for ambulation for safety as she will need to use RW. She needed min assist to don CAM boot.  Pt HR 79-95bpm with activity.  Pt sats on 3L at rest 95%; 84% on RA at rest and needed 6L to maintain >91% with activity.  Notified nurse that pt will need 24 hour care and they are callng her daughter to verify.   Pt currently with functional limitations due to balance and endurance deficits. Pt will benefit from skilled PT to increase their independence and safety with mobility to allow discharge to the venue listed below.     Follow Up Recommendations  Home health PT;Supervision/Assistance - 24 hour     Equipment Recommendations  None recommended by PT (home O2)    Recommendations for Other Services       Precautions / Restrictions Precautions Precautions: Fall Precaution Comments: O2 via cannula Required Braces or Orthoses: Other Brace (CAM boot left LE) Restrictions Weight Bearing Restrictions: Yes LLE Weight Bearing: Weight bearing as tolerated    Mobility  Bed Mobility Overal bed mobility: Needs Assistance Bed Mobility: Supine to Sit;Sit to Supine     Supine to sit: Min assist     General bed mobility comments: A little assist to come to eOB with pt pulling up on therapist  Transfers Overall transfer level: Needs  assistance Equipment used: Rolling walker (2 wheeled);1 person hand held assist Transfers: Sit to/from Stand Sit to Stand: Min guard         General transfer comment: slightly increased time, cues for hand placement and steadying assist   Ambulation/Gait Ambulation/Gait assistance: Min guard Gait Distance (Feet): 35 Feet Assistive device: Rolling walker (2 wheeled) Gait Pattern/deviations: Decreased weight shift to left;Step-through pattern;Wide base of support;Decreased stance time - left Gait velocity: decreased Gait velocity interpretation: <1.31 ft/sec, indicative of household ambulator General Gait Details: Pt able to ambulate with RW with min guard assist without LOB with pt needing cues for sequencing with new CAM boot in place. Pt desats on RA at rest and needs 3L at rest and 6L with activity to maintain sats.     Stairs             Wheelchair Mobility    Modified Rankin (Stroke Patients Only)       Balance Overall balance assessment: No apparent balance deficits (not formally assessed)                                          Cognition Arousal/Alertness: Awake/alert Behavior During Therapy: WFL for tasks assessed/performed Overall Cognitive Status: Within Functional Limits for tasks assessed  Exercises      General Comments General comments (skin integrity, edema, etc.): Needed assist to don CAM boot      Pertinent Vitals/Pain Pain Assessment: Faces Faces Pain Scale: Hurts even more Pain Location: left foot Pain Descriptors / Indicators: Aching;Discomfort;Grimacing;Guarding Pain Intervention(s): Limited activity within patient's tolerance;Monitored during session;Repositioned    Home Living Family/patient expects to be discharged to:: Private residence Living Arrangements: Alone Available Help at Discharge: Family;Available 24 hours/day Type of Home: House Home Access:  Stairs to enter Entrance Stairs-Rails: Right;Left;Can reach both Home Layout: One level Home Equipment: Other (comment);Walker - 2 wheels;Cane - quad;Wheelchair - manual;Bedside commode Additional Comments: Pt sponge bathes at baseline    Prior Function Level of Independence: Independent with assistive device(s)      Comments: furniture walking and uses stick outside of the house   PT Goals (current goals can now be found in the care plan section) Acute Rehab PT Goals Patient Stated Goal: return home with family to assist Progress towards PT goals: Progressing toward goals    Frequency    Min 5X/week      PT Plan Current plan remains appropriate    Co-evaluation              AM-PAC PT "6 Clicks" Mobility   Outcome Measure  Help needed turning from your back to your side while in a flat bed without using bedrails?: None Help needed moving from lying on your back to sitting on the side of a flat bed without using bedrails?: A Little Help needed moving to and from a bed to a chair (including a wheelchair)?: A Little Help needed standing up from a chair using your arms (e.g., wheelchair or bedside chair)?: A Little Help needed to walk in hospital room?: A Little Help needed climbing 3-5 steps with a railing? : A Little 6 Click Score: 19    End of Session Equipment Utilized During Treatment: Gait belt;Oxygen Activity Tolerance: Patient limited by fatigue Patient left: in chair;with call bell/phone within reach;with chair alarm set Nurse Communication: Mobility status PT Visit Diagnosis: Unsteadiness on feet (R26.81);Other abnormalities of gait and mobility (R26.89);Muscle weakness (generalized) (M62.81)     Time: 9485-4627 PT Time Calculation (min) (ACUTE ONLY): 25 min  Charges:  $Gait Training: 23-37 mins                     Majed Pellegrin W,PT [place] Pager:  [phone]  Office:  [place] 05/03/2020, 12:00 PM

## 2020-05-04 DIAGNOSIS — K922 Gastrointestinal hemorrhage, unspecified: Secondary | ICD-10-CM | POA: Diagnosis not present

## 2020-05-04 DIAGNOSIS — S82892A Other fracture of left lower leg, initial encounter for closed fracture: Secondary | ICD-10-CM

## 2020-05-04 DIAGNOSIS — I248 Other forms of acute ischemic heart disease: Secondary | ICD-10-CM | POA: Diagnosis not present

## 2020-05-04 DIAGNOSIS — I5033 Acute on chronic diastolic (congestive) heart failure: Secondary | ICD-10-CM | POA: Diagnosis not present

## 2020-05-04 LAB — CBC WITH DIFFERENTIAL/PLATELET
Abs Immature Granulocytes: 0.06 10*3/uL (ref 0.00–0.07)
Basophils Absolute: 0 10*3/uL (ref 0.0–0.1)
Basophils Relative: 0 %
Eosinophils Absolute: 0.2 10*3/uL (ref 0.0–0.5)
Eosinophils Relative: 2 %
HCT: 32.8 % — ABNORMAL LOW (ref 36.0–46.0)
Hemoglobin: 10.8 g/dL — ABNORMAL LOW (ref 12.0–15.0)
Immature Granulocytes: 1 %
Lymphocytes Relative: 25 %
Lymphs Abs: 2.3 10*3/uL (ref 0.7–4.0)
MCH: 29.3 pg (ref 26.0–34.0)
MCHC: 32.9 g/dL (ref 30.0–36.0)
MCV: 89.1 fL (ref 80.0–100.0)
Monocytes Absolute: 0.9 10*3/uL (ref 0.1–1.0)
Monocytes Relative: 10 %
Neutro Abs: 5.7 10*3/uL (ref 1.7–7.7)
Neutrophils Relative %: 62 %
Platelets: 275 10*3/uL (ref 150–400)
RBC: 3.68 MIL/uL — ABNORMAL LOW (ref 3.87–5.11)
RDW: 16.3 % — ABNORMAL HIGH (ref 11.5–15.5)
WBC: 9.2 10*3/uL (ref 4.0–10.5)
nRBC: 0 % (ref 0.0–0.2)

## 2020-05-04 LAB — GLUCOSE, CAPILLARY
Glucose-Capillary: 145 mg/dL — ABNORMAL HIGH (ref 70–99)
Glucose-Capillary: 208 mg/dL — ABNORMAL HIGH (ref 70–99)
Glucose-Capillary: 145 mg/dL — ABNORMAL HIGH (ref 70–99)
Glucose-Capillary: 164 mg/dL — ABNORMAL HIGH (ref 70–99)

## 2020-05-04 LAB — BASIC METABOLIC PANEL
Anion gap: 12 (ref 5–15)
BUN: 23 mg/dL (ref 8–23)
CO2: 27 mmol/L (ref 22–32)
Calcium: 8.7 mg/dL — ABNORMAL LOW (ref 8.9–10.3)
Chloride: 99 mmol/L (ref 98–111)
Creatinine, Ser: 1.13 mg/dL — ABNORMAL HIGH (ref 0.44–1.00)
GFR calc Af Amer: 48 mL/min — ABNORMAL LOW (ref >60)
GFR calc non Af Amer: 41 mL/min — ABNORMAL LOW (ref >60)
Glucose, Bld: 150 mg/dL — ABNORMAL HIGH (ref 70–99)
Potassium: 3.7 mmol/L (ref 3.5–5.1)
Sodium: 138 mmol/L (ref 135–145)

## 2020-05-04 LAB — MAGNESIUM: Magnesium: 2 mg/dL (ref 1.7–2.4)

## 2020-05-04 MED ORDER — FUROSEMIDE 10 MG/ML IJ SOLN
40.0000 mg | Freq: Once | INTRAMUSCULAR | Status: AC
  Administered 2020-05-04: 40 mg via INTRAVENOUS
  Filled 2020-05-04: qty 4

## 2020-05-04 NOTE — Care Management Important Message (Signed)
Important Message  Patient Details  Name: Denise Parrish MRN: [id number] Date of Birth: 1923/02/13   Medicare Important Message Given:  Yes     [name] 05/04/2020, 1:48 PM

## 2020-05-04 NOTE — Progress Notes (Signed)
Physical Therapy Treatment Patient Details Name: Denise Parrish MRN: [id number] DOB: 1923/06/02 Today's Date: 05/04/2020    History of Present Illness 86 y.o. female was admitted after 5 days of weakness and SOB, demand ischemia and divertiulosis causing GI bleed.  Pt was noted to have cholelithiasis, aortic stenosis.  Fractured left ankle while in hospital but nonoperative treatment per Ortho.  PMHx:  CVA, carotid stenosis, DM, GERD, HTN, facial weakness, atherosclerosis, anxiety, chest pain, heart murmur, emphysema, PNA, osteoporosis, cognitive impairment, R THA    PT Comments    Pt agreeable to get up with therapy as she wants to see if she will be able to care for herself when she goes home. Pt request to use BSC first. Pt limited in safe mobility by dizziness with exertion of possible SaO2 desaturation (see General Comments) in presence of decreased strength and increased L ankle pain with weightbearing.  Pt is supervision for bed mobility, min A for transfers and min guard for ambulation of 8 feet with RW. Pt reports she does not think that she will be able to get up stairs to get in home and although her daughter can stay with her short term she fears she will take along time to get better. PT recommending CIR to improve strength and endurance prior to going home. PT will continue to follow acutely.    Follow Up Recommendations  CIR     Equipment Recommendations  None recommended by PT (home O2)       Precautions / Restrictions Precautions Precautions: Fall Precaution Comments: O2 via cannula Required Braces or Orthoses: Other Brace (CAM boot left LE) Restrictions Weight Bearing Restrictions: Yes LLE Weight Bearing: Weight bearing as tolerated    Mobility  Bed Mobility Overal bed mobility: Needs Assistance Bed Mobility: Supine to Sit;Sit to Supine     Supine to sit: Supervision     General bed mobility comments: supervision for safety, increased time and effort as well  as use of bed rail to come to EoB  Transfers Overall transfer level: Needs assistance Equipment used: Rolling walker (2 wheeled);1 person hand held assist Transfers: Sit to/from Stand Sit to Stand: Min assist         General transfer comment: min A for power up from bed and from [facility]  Ambulation/Gait Ambulation/Gait assistance: Min guard Gait Distance (Feet): 8 Feet (1x2', 1x8') Assistive device: Rolling walker (2 wheeled) Gait Pattern/deviations: Decreased weight shift to left;Step-through pattern;Wide base of support;Decreased stance time - left Gait velocity: decreased   General Gait Details: min guard for safety, increased cuing for proximity to RW to improve mechanical advantage of UE       Balance Overall balance assessment: Mild deficits observed, not formally tested                                          Cognition Arousal/Alertness: Awake/alert Behavior During Therapy: WFL for tasks assessed/performed Overall Cognitive Status: Within Functional Limits for tasks assessed                                        Exercises General Exercises - Lower Extremity Heel Slides: Left;5 reps;Supine Straight Leg Raises: Left;5 reps;Supine    General Comments General comments (skin integrity, edema, etc.): pt described dizziness after washing hands, unable to  determine SaO2 immediately given poor reading so increased O2 to 4 L via [place], when good pleth waveform found pt 100%SaO2 reduced back to 3L O2 and pt able to maintain SaO2 >90%O2      Pertinent Vitals/Pain Pain Assessment: Faces Faces Pain Scale: Hurts little more Pain Location: left foot Pain Descriptors / Indicators: Aching;Discomfort;Grimacing;Guarding Pain Intervention(s): Limited activity within patient's tolerance;Monitored during session;Repositioned           PT Goals (current goals can now be found in the care plan section) Acute Rehab PT Goals Patient Stated Goal: return  home with family to assist PT Goal Formulation: With patient Time For Goal Achievement: 05/13/20 Potential to Achieve Goals: Good Progress towards PT goals: Progressing toward goals    Frequency    Min 5X/week      PT Plan Current plan remains appropriate       AM-PAC PT "6 Clicks" Mobility   Outcome Measure  Help needed turning from your back to your side while in a flat bed without using bedrails?: None Help needed moving from lying on your back to sitting on the side of a flat bed without using bedrails?: A Little Help needed moving to and from a bed to a chair (including a wheelchair)?: A Little Help needed standing up from a chair using your arms (e.g., wheelchair or bedside chair)?: A Little Help needed to walk in hospital room?: A Little Help needed climbing 3-5 steps with a railing? : A Little 6 Click Score: 19    End of Session Equipment Utilized During Treatment: Gait belt;Oxygen Activity Tolerance: Patient limited by fatigue Patient left: in chair;with call bell/phone within reach;with chair alarm set Nurse Communication: Mobility status PT Visit Diagnosis: Unsteadiness on feet (R26.81);Other abnormalities of gait and mobility (R26.89);Muscle weakness (generalized) (M62.81)     Time: 8841-6606 PT Time Calculation (min) (ACUTE ONLY): 20 min  Charges:  $Gait Training: 8-22 mins                     Morna Flud B. Migdalia Dk PT, DPT Acute Rehabilitation Services Pager [phone] Office [phone]    G. L. Garcia 05/04/2020, 6:04 PM

## 2020-05-04 NOTE — Progress Notes (Signed)
Palliative:  I previously met with patient and daughter. They request full code, full aggressive care in light of good functional status and QOL. I have been shadowing chart but patient appears stable and I will sign off at this time. Please re-consult palliative as indicated. Thank you for this consult.   No charge   , NP Palliative Medicine Team Pager [phone] (Please see [email or web address] for schedule) Team Phone [phone]   

## 2020-05-04 NOTE — Progress Notes (Signed)
Patient ID: Denise Parrish, female   DOB: Feb 09, 1923, 86 y.o.   MRN: [id number]  PROGRESS NOTE    Denise Parrish  LOV:[id number] DOB: [birth year] DOA: 04/26/2020 PCP: [name], MD   Brief Narrative:  86 year old female with history of palpitations, minimal CAD on cath in 2005, moderate to severe AS on echo in 09/27/2019 with normal LVEF 60 to 65% and grade 1 diastolic dysfunction, hypertension, diabetes mellitus type 2, hyperlipidemia, unspecified CVA in January 2021 and TIA in July 2021 for which she was on aspirin and Plavix, status post CEA on 10/21/2019 was admitted on 04/26/2020 with weakness and melena.  Hemoglobin was 7.2 on admission for which she required 1 unit of packed red cell transfusion.  GI and cardiology were consulted.  She was transferred to [facility] for further GI work-up as anesthesia at [facility] did not feel comfortable with providing anesthesia at that facility.  Assessment & Plan:  Acute blood loss anemia Probable upper GI bleeding -Patient presented with hemoglobin of 7.2, hemoglobin was 11.9 on 04/07/2020.  Status post 2units of packed red cell transfusion during the hospitalization, last transfusion was on 04/30/2020.  Hemoglobin 9.8 today.  Monitor H&H. -Currently on Protonix 40 mg IV every 12 hours.   -GI following.  Status post EGD on 04/28/2020 which showed no evidence of active bleeding; 1 medium size gastric polyp removed with biopsy forceps.  CT of the abdomen and pelvis done subsequently showed severe sigmoid diverticulosis and cholelithiasis but no acute intra-abdominal or pelvic pathology.  Tagged red cell scan on 04/29/2020 as per GI did not show any evidence of active bleeding -Capsule endoscopy done on 04/30/2020 was a limited study as capsule remained in the stomach for more than 11 hours Limited evaluation of the duodenum showed no active bleeding: GI discussed this with the family who did not want any further investigation or work-up.   GI has signed off and cleared the patient to be started on antiplatelets. -Continue once a day oral Protonix.  Acute hypoxic respiratory failure Probable fluid overload/acute on chronic diastolic heart failure -Patient currently requiring 3 L oxygen with yesterday she required 6 L oxygen with ambulation and subsequently received one dose of intravenous Lasix and had good urine output.  Will give one more dose of intravenous Lasix today.  Hopefully oxygen requirement improves slightly tomorrow.  Chest x-ray on 05/03/2020 did not show any congestion.  Hesitant to put her on scheduled daily doses of Lasix given her recent syncope and moderate aortic stenosis. -Strict input output.  Daily weights.  Fluid restriction.  Unspecified CVA in January 2021 and TIA in July 2021 Recent right CEA on 2-21 for symptomatic right carotid artery stenosis -Patient was on aspirin 325 mg and Plavix 75 mg prior to presentation.  Patient has been resumed on aspirin 81 mg daily.  Cardiology recommends only continue aspirin 81 mg for now and hold off on Plavix till reevaluation as an outpatient. -Lipitor has been resumed.  Elevated troponin -Most likely due to demand ischemia.  Cardiology following and recommending medical management.  Echo with preserved EF of 60 to 65% with grade 2 diastolic dysfunction with moderately elevated pulmonary artery systolic pressure. -Cardiology has signed off.  Outpatient follow-up.  She has been restarted on metoprolol 12.5 mg twice a day by cardiology  Syncope -Patient had a syncopal episode at assisted fall while ambulating from bathroom with nurse tech.  She got really hot and weak and went down to her knees  similar did not hit her head.  She was given 250 cc normal saline because her blood pressure was in the 90s.   -Metoprolol plan as above.  Nondisplaced left distal fibular shaft fracture -Patient started having left ankle pain after syncopal episode/fall.  X-ray shows findings as  above. -Orthopedics following and patient being managed conservatively at this time with weightbearing as tolerated in  CAM boot.  Repeat x-ray did not show any displacement.  Outpatient follow-up with Dr. Doreatha Martin. -Patient will need home health PT.  Aortic stenosis -Moderate as per cardiology documentation.  Hypertension -Blood pressure stable.  She has been resumed on oral metoprolol by cardiology.  Diabetes mellitus type 2 -A1c 7.  Amaryl on hold -Continue CBGs with SSI  Anxiety disorder -Lorazepam as needed  Generalized conditioning -PT recommends home health PT.  Palliative care evaluated the patient during this hospitalization.  Patient remains full code.   DVT prophylaxis: SCDs Code Status: Full Family Communication: Spoke to daughter/Nancy on phone on 05/04/2020 Disposition Plan: Status is: Inpatient  Remains inpatient appropriate because:IV treatments appropriate due to intensity of illness or inability to take PO.  Still requiring significant supplemental oxygen.  Give one dose of IV Lasix today and probable discharge tomorrow   Dispo:  Patient From: Home  Planned Disposition: Home  expected discharge date: 05/05/2020  Medically stable for discharge: No    Consultants: GI/cardiology/palliative care  Procedures:  Echo 1. Left ventricular ejection fraction, by estimation, is 60 to 65%. The  left ventricle has normal function. The left ventricle has no regional  wall motion abnormalities. There is mild left ventricular hypertrophy.  Left ventricular diastolic parameters  are consistent with Grade II diastolic dysfunction (pseudonormalization).  Elevated left atrial pressure.  2. Right ventricular systolic function is normal. The right ventricular  size is normal. There is moderately elevated pulmonary artery systolic  pressure.  3. The mitral valve is abnormal. Mild mitral valve regurgitation.  4. AV is thickened, calcified with restricted motion. Peak and  mean  gradients through the vlave are 49 and 28 mm respectively consistent with  moderate AS. Dimensionless index is 0.37. Compareed to echo from Jan ,  gradients are decreased.. The aortic  valve is abnormal. Aortic valve regurgitation is not visualized. Mild  aortic valve sclerosis is present, with no evidence of aortic valve  stenosis.  5. The inferior vena cava is normal in size with greater than 50%  respiratory variability, suggesting right atrial pressure of 3 mmHg.   EGD on 04/28/2020 Findings:      The Z-line was irregular and was found 35 cm from the incisors.      A 4 cm hiatal hernia was present.      A few small sessile polyps were found in the cardia and in the gastric       body.      A single 7 mm sessile polyp was found in the gastric body. The polyp was       removed with a piecemeal technique using a cold biopsy forceps.       Resection and retrieval were complete.      The cardia and gastric fundus were normal on retroflexion.      The duodenal bulb, first portion of the duodenum and second portion of       the duodenum were normal. Impression:               - Z-line irregular, 35 cm from the incisors.                           -  4 cm hiatal hernia.                           - A few gastric polyps.                           - A single gastric polyp. Resected and retrieved.                           - Normal duodenal bulb, first portion of the                            duodenum and second portion of the duodenum. Recommendation:           - Return patient to hospital ward for ongoing care.                           - Clear liquid diet.                           - Continue present medications.                           - Perform a colonoscopy if ongoing bleeding.  Antimicrobials: None   Subjective: Patient seen and examined at bedside.  Feels better but only wants to go home today if she feels that her breathing is okay walking around.  Does not want to come  back to the hospital soon.  Denies any chest pain, vomiting, black or bloody stools.   Objective: Vitals:   05/03/20 0838 05/03/20 1134 05/03/20 2025 05/04/20 0329  BP: (!) 133/57 112/72 118/64 128/65  Pulse: 81 84 92 88  Resp:  18 18 18   Temp:  (!) 97.5 F (36.4 C) 98.9 F (37.2 C) 98.3 F (36.8 C)  TempSrc:  Oral Oral Oral  SpO2: 99% 99% 97% 92%  Weight:    74.6 kg  Height:        Intake/Output Summary (Last 24 hours) at 05/04/2020 1058 Last data filed at 05/04/2020 0819 Gross per 24 hour  Intake 720 ml  Output 3175 ml  Net -2455 ml   Filed Weights   05/02/20 0357 05/03/20 0402 05/04/20 0329  Weight: 77.9 kg 77 kg 74.6 kg    Examination:  General exam: No distress.  Slightly hard of hearing.  Elderly female lying in bed.  Currently on 3 L oxygen via nasal cannula. Respiratory system: Bilateral decreased breath sounds at bases with scattered crackles, no wheezing  cardiovascular system: S1-S2 heard, rate controlled Gastrointestinal system: Abdomen is nondistended, soft and nontender.  Normal bowel sounds heard  extremities: Mild lower extremity edema present.  Left ankle has a CAM boot    Data Reviewed: I have personally reviewed following labs and imaging studies  CBC: Recent Labs  Lab 04/30/20 0429 04/30/20 0429 04/30/20 2027 05/01/20 0504 05/02/20 0703 05/03/20 0417 05/04/20 0409  WBC 7.2  --   --  7.6 8.5 8.7 9.2  NEUTROABS 3.9  --   --  4.8 5.7 6.2 5.7  HGB 8.0*   < > 9.8* 9.2* 9.8* 9.6* 10.8*  HCT 25.3*   < > 31.0* 28.9* 31.0* 30.4* 32.8*  MCV 90.4  --   --  88.7 90.1 89.7 89.1  PLT 234  --   --  208 207 222 275   < > = values in this interval not displayed.   Basic Metabolic Panel: Recent Labs  Lab 04/30/20 0429 05/01/20 0504 05/02/20 0703 05/03/20 0417 05/04/20 0409  NA 140 140 139 138 138  K 3.5 3.5 3.6 3.9 3.7  CL 103 104 104 104 99  CO2 29 26 25 25 27   GLUCOSE 120* 142* 158* 171* 150*  BUN 9 12 13 18 23   CREATININE 1.08* 1.10* 1.07*  1.16* 1.13*  CALCIUM 8.4* 8.2* 8.1* 8.2* 8.7*  MG 2.0 2.3 2.2 2.1 2.0   GFR: Estimated Creatinine Clearance: 30.1 mL/min (A) (by C-G formula based on SCr of 1.13 mg/dL (H)). Liver Function Tests: No results for input(s): AST, ALT, ALKPHOS, BILITOT, PROT, ALBUMIN in the last 168 hours. No results for input(s): LIPASE, AMYLASE in the last 168 hours. No results for input(s): AMMONIA in the last 168 hours. Coagulation Profile: No results for input(s): INR, PROTIME in the last 168 hours. Cardiac Enzymes: No results for input(s): CKTOTAL, CKMB, CKMBINDEX, TROPONINI in the last 168 hours. BNP (last 3 results) No results for input(s): PROBNP in the last 8760 hours. HbA1C: No results for input(s): HGBA1C in the last 72 hours. CBG: Recent Labs  Lab 05/03/20 0604 05/03/20 1135 05/03/20 1636 05/03/20 2109 05/04/20 0556  GLUCAP 145* 152* 162* 148* 145*   Lipid Profile: No results for input(s): CHOL, HDL, LDLCALC, TRIG, CHOLHDL, LDLDIRECT in the last 72 hours. Thyroid Function Tests: No results for input(s): TSH, T4TOTAL, FREET4, T3FREE, THYROIDAB in the last 72 hours. Anemia Panel: No results for input(s): VITAMINB12, FOLATE, FERRITIN, TIBC, IRON, RETICCTPCT in the last 72 hours. Sepsis Labs: No results for input(s): PROCALCITON, LATICACIDVEN in the last 168 hours.  Recent Results (from the past 240 hour(s))  SARS Coronavirus 2 by RT PCR (hospital order, performed in [facility] hospital lab) Nasopharyngeal Nasopharyngeal Swab     Status: None   Collection Time: 04/26/20  1:54 AM   Specimen: Nasopharyngeal Swab  Result Value Ref Range Status   SARS Coronavirus 2 NEGATIVE NEGATIVE Final    Comment: (NOTE) SARS-CoV-2 target nucleic acids are NOT DETECTED.  The SARS-CoV-2 RNA is generally detectable in upper and lower respiratory specimens during the acute phase of infection. The lowest concentration of SARS-CoV-2 viral copies this assay can detect is 250 copies / mL. A negative  result does not preclude SARS-CoV-2 infection and should not be used as the sole basis for treatment or other patient management decisions.  A negative result may occur with improper specimen collection / handling, submission of specimen other than nasopharyngeal swab, presence of viral mutation(s) within the areas targeted by this assay, and inadequate number of viral copies (<250 copies / mL). A negative result must be combined with clinical observations, patient history, and epidemiological information.  Fact Sheet for Patients:   StrictlyIdeas.no  Fact Sheet for Healthcare Providers: BankingDealers.co.za  This test is not yet approved or  cleared by the [place] FDA and has been authorized for detection and/or diagnosis of SARS-CoV-2 by FDA under an Emergency Use Authorization (EUA).  This EUA will remain in effect (meaning this test can be used) for the duration of the COVID-19 declaration under Section 564(b)(1) of the Act, 21 U.S.C. section 360bbb-3(b)(1), unless the authorization is terminated or revoked sooner.  Performed at [facility], [address], [place], [place] [address]  Radiology Studies: DG CHEST PORT 1 VIEW  Result Date: 05/03/2020 CLINICAL DATA:  Dyspnea. EXAM: PORTABLE CHEST 1 VIEW COMPARISON:  PA and lateral chest 10/16/2019. Single-view of the chest 10/05/2019. CT chest 10/05/2019. FINDINGS: Heart size is normal. Aortic atherosclerosis is seen. No consolidative process, pneumothorax or effusion. No acute or focal bony abnormality. IMPRESSION: No acute disease. Aortic Atherosclerosis (ICD10-I70.0). Electronically Signed   By: Inge Rise M.D.   On: 05/03/2020 13:25   DG Tibia/Fibula Left Port  Result Date: 05/03/2020 CLINICAL DATA:  Fracture of distal fibula. EXAM: PORTABLE LEFT TIBIA AND FIBULA - 2 VIEW COMPARISON:  None. FINDINGS: Acute fracture involving the distal diaphysis of the fibula  is again noted and appears unchanged in appearance from previous exam. A second, nondisplaced fracture is noted involving the proximal diaphysis of the fibula. IMPRESSION: No change in appearance of acute fractures involving the distal diaphysis of the fibula and proximal diaphysis of the fibula. Electronically Signed   By: Kerby Moors M.D.   On: 05/03/2020 14:15        Scheduled Meds: . aspirin  81 mg Oral Daily  . atorvastatin  40 mg Oral Daily  . insulin aspart  0-6 Units Subcutaneous TID WC  . metoprolol tartrate  12.5 mg Oral BID  . pantoprazole  40 mg Oral Daily   Continuous Infusions:        Aline August, MD [facility] 05/04/2020, 10:58 AM \

## 2020-05-05 DIAGNOSIS — S82892A Other fracture of left lower leg, initial encounter for closed fracture: Secondary | ICD-10-CM | POA: Diagnosis not present

## 2020-05-05 DIAGNOSIS — F419 Anxiety disorder, unspecified: Secondary | ICD-10-CM | POA: Diagnosis not present

## 2020-05-05 DIAGNOSIS — K922 Gastrointestinal hemorrhage, unspecified: Secondary | ICD-10-CM | POA: Diagnosis not present

## 2020-05-05 DIAGNOSIS — I248 Other forms of acute ischemic heart disease: Secondary | ICD-10-CM | POA: Diagnosis not present

## 2020-05-05 LAB — BASIC METABOLIC PANEL
Anion gap: 12 (ref 5–15)
BUN: 23 mg/dL (ref 8–23)
CO2: 26 mmol/L (ref 22–32)
Calcium: 8.9 mg/dL (ref 8.9–10.3)
Chloride: 96 mmol/L — ABNORMAL LOW (ref 98–111)
Creatinine, Ser: 1.16 mg/dL — ABNORMAL HIGH (ref 0.44–1.00)
GFR calc Af Amer: 46 mL/min — ABNORMAL LOW (ref >60)
GFR calc non Af Amer: 40 mL/min — ABNORMAL LOW (ref >60)
Glucose, Bld: 219 mg/dL — ABNORMAL HIGH (ref 70–99)
Potassium: 3.6 mmol/L (ref 3.5–5.1)
Sodium: 134 mmol/L — ABNORMAL LOW (ref 135–145)

## 2020-05-05 LAB — CBC WITH DIFFERENTIAL/PLATELET
Abs Immature Granulocytes: 0.04 10*3/uL (ref 0.00–0.07)
Basophils Absolute: 0 10*3/uL (ref 0.0–0.1)
Basophils Relative: 0 %
Eosinophils Absolute: 0.2 10*3/uL (ref 0.0–0.5)
Eosinophils Relative: 2 %
HCT: 35.4 % — ABNORMAL LOW (ref 36.0–46.0)
Hemoglobin: 11.4 g/dL — ABNORMAL LOW (ref 12.0–15.0)
Immature Granulocytes: 1 %
Lymphocytes Relative: 16 %
Lymphs Abs: 1.4 10*3/uL (ref 0.7–4.0)
MCH: 29.1 pg (ref 26.0–34.0)
MCHC: 32.2 g/dL (ref 30.0–36.0)
MCV: 90.3 fL (ref 80.0–100.0)
Monocytes Absolute: 0.9 10*3/uL (ref 0.1–1.0)
Monocytes Relative: 10 %
Neutro Abs: 6.2 10*3/uL (ref 1.7–7.7)
Neutrophils Relative %: 71 %
Platelets: 290 10*3/uL (ref 150–400)
RBC: 3.92 MIL/uL (ref 3.87–5.11)
RDW: 15.9 % — ABNORMAL HIGH (ref 11.5–15.5)
WBC: 8.7 10*3/uL (ref 4.0–10.5)
nRBC: 0 % (ref 0.0–0.2)

## 2020-05-05 LAB — GLUCOSE, CAPILLARY
Glucose-Capillary: 153 mg/dL — ABNORMAL HIGH (ref 70–99)
Glucose-Capillary: 200 mg/dL — ABNORMAL HIGH (ref 70–99)
Glucose-Capillary: 143 mg/dL — ABNORMAL HIGH (ref 70–99)
Glucose-Capillary: 215 mg/dL — ABNORMAL HIGH (ref 70–99)

## 2020-05-05 LAB — MAGNESIUM: Magnesium: 1.9 mg/dL (ref 1.7–2.4)

## 2020-05-05 NOTE — Progress Notes (Signed)
Inpatient Rehab Admissions Coordinator Note:   Per therapy recommendations, pt was screened for CIR candidacy by [name], PT, DPT.  At this time we are recommending an inpatient rehab consult and I will place an order per our protocol.  Please contact me with questions.   [name], PT, DPT [phone] 05/05/20 11:45 AM

## 2020-05-05 NOTE — Progress Notes (Signed)
Physical Therapy Treatment Patient Details Name: Denise Parrish MRN: [id number] DOB: Aug 02, 1923 Today's Date: 05/05/2020    History of Present Illness 86 y.o. female was admitted after 5 days of weakness and SOB, demand ischemia and divertiulosis causing GI bleed.  Pt was noted to have cholelithiasis, aortic stenosis.  Fractured left ankle while in hospital but nonoperative treatment per Ortho.  PMHx:  CVA, carotid stenosis, DM, GERD, HTN, facial weakness, atherosclerosis, anxiety, chest pain, heart murmur, emphysema, PNA, osteoporosis, cognitive impairment, R THA    PT Comments    Pt admitted with above diagnosis. Pt was able to ambulate with min guard assist with cues 45 feet with RW with need for chair follow as she had DOE3/4 and had to rest in chair.  Very fatigued after that and did a few more exercises.  Hopeful for REhab soon.   Pt currently with functional limitations due to balance and endurance deficits. Pt will benefit from skilled PT to increase their independence and safety with mobility to allow discharge to the venue listed below.    Follow Up Recommendations  CIR     Equipment Recommendations  None recommended by PT (home O2)    Recommendations for Other Services Rehab consult     Precautions / Restrictions Precautions Precautions: Fall Precaution Comments: O2 via cannula Required Braces or Orthoses: Other Brace (CAM boot left LE) Restrictions LLE Weight Bearing: Weight bearing as tolerated    Mobility  Bed Mobility               General bed mobility comments: in chair on arrival  Transfers Overall transfer level: Needs assistance Equipment used: Rolling walker (2 wheeled);1 person hand held assist Transfers: Sit to/from Stand Sit to Stand: Min guard         General transfer comment: min guard A for power up with cues for hand placement  Ambulation/Gait Ambulation/Gait assistance: Min guard Gait Distance (Feet): 45 Feet Assistive device:  Rolling walker (2 wheeled) Gait Pattern/deviations: Decreased weight shift to left;Step-through pattern;Wide base of support;Decreased stance time - left Gait velocity: decreased Gait velocity interpretation: <1.31 ft/sec, indicative of household ambulator General Gait Details: min guard for safety, increased cuing for proximity to RW to improve mechanical advantage of UE.  Followed with chair and pt did need to sit after 45 feet due to fatigue and DOE3/4 with O2 3L at rest and 6L to keep sats >88% with ambulation.    Stairs             Wheelchair Mobility    Modified Rankin (Stroke Patients Only)       Balance Overall balance assessment: Mild deficits observed, not formally tested                                          Cognition Arousal/Alertness: Awake/alert Behavior During Therapy: WFL for tasks assessed/performed Overall Cognitive Status: Within Functional Limits for tasks assessed                                        Exercises General Exercises - Lower Extremity Long Arc Quad: AROM;Both;15 reps;Seated Hip Flexion/Marching: AROM;Both;15 reps;Seated    General Comments        Pertinent Vitals/Pain Pain Assessment: Faces Faces Pain Scale: Hurts little more Pain Location: left foot Pain  Descriptors / Indicators: Aching;Discomfort;Grimacing;Guarding Pain Intervention(s): Limited activity within patient's tolerance;Monitored during session;Repositioned    Home Living                      Prior Function            PT Goals (current goals can now be found in the care plan section) Acute Rehab PT Goals Patient Stated Goal: return home with family to assist Progress towards PT goals: Progressing toward goals    Frequency    Min 5X/week      PT Plan Current plan remains appropriate    Co-evaluation              AM-PAC PT "6 Clicks" Mobility   Outcome Measure  Help needed turning from your back to  your side while in a flat bed without using bedrails?: None Help needed moving from lying on your back to sitting on the side of a flat bed without using bedrails?: A Little Help needed moving to and from a bed to a chair (including a wheelchair)?: A Little Help needed standing up from a chair using your arms (e.g., wheelchair or bedside chair)?: A Little Help needed to walk in hospital room?: A Little Help needed climbing 3-5 steps with a railing? : A Little 6 Click Score: 19    End of Session Equipment Utilized During Treatment: Gait belt;Oxygen Activity Tolerance: Patient limited by fatigue Patient left: in chair;with call bell/phone within reach;with chair alarm set Nurse Communication: Mobility status PT Visit Diagnosis: Unsteadiness on feet (R26.81);Other abnormalities of gait and mobility (R26.89);Muscle weakness (generalized) (M62.81)     Time: 5686-1683 PT Time Calculation (min) (ACUTE ONLY): 14 min  Charges:  $Gait Training: 8-22 mins                     Runette Scifres W,PT [place] Pager:  [phone]  Office:  [place] 05/05/2020, 12:58 PM

## 2020-05-05 NOTE — Progress Notes (Signed)
IP rehab admissions - I met with patient and her daughter at the bedside.  They would like CIR admission.  I have called and requested insurance authorization for CIR from [place].  I will follow up once I hear back from insurance case manager.  Call me for questions.  [phone]

## 2020-05-05 NOTE — Progress Notes (Addendum)
PROGRESS NOTE    Denise Parrish  HFW:[id number] DOB: Mar 16, 1923 DOA: 04/26/2020 PCP: [name], MD   Brief Narrative:  HPI on 04/26/2020 by Dr. Tennis Must Denise Parrish is a 86 y.o. female with medical history significant of anxiety, aortic sclerosis, cardiac stenosis, history of chest pain palpitations, cholelithiasis, DJD, type 2 diabetes with neuropathy, emphysema, GERD,/hiatal hernia, hyperlipidemia, hypertension, iron deficiency anemia, mild cognitive impairment, overweight with BMI of 27.79 kg/m, osteoporosis, osteoporosis, pneumonia, sigmoid diverticulosis, history of other nonhemorrhagic CVA who is brought to the emergency department by her daughter due to 5 days associated with pallor and weakness.  The patient has been lightheaded at times with fatigue with minimal exertion.  She has had some dyspnea, chest pressure and palpitations.  She has had some mild epigastric discomfort.  She denies nausea, emesis, constipation, but has had dark loose stools.  She denies headache, fever, chills, sore throat, rhinorrhea, PND, orthopnea or recent pitting edema of the lower extremities.  No dysuria, frequency or hematuria.  No polyuria, polydipsia, polyphagia or blurred vision.  Assessment & Plan   Acute blood loss anemia/ Probable upper GI bleeding -Patient presented with hemoglobin of 7.2, hemoglobin was 11.9 on 04/07/2020.   -She was transfused 2u PRBC, hemoglobin currently 11.4 -Initially placed on Protonix 40 mg IV every 12 hours, now oral dosing daily  -Gastroenterology consulted and appreciated -Status post EGD on 04/28/2020, no evidence of active bleeding.  One medium size gastric polyp removed with biopsy forceps -CT abdomen pelvis done subsequently showed severe sigmoid diverticulosis and cholelithiasis but no intra-abdominal or pelvic pathology -Tagged red cell scan on 04/29/2020 did not show any evidence of active bleeding -Status post capsule endoscopy on 04/30/2020 which  showed limited study as capsule remained in the stomach for more than 11 hours.  Limited evaluation of the duodenum showed no active bleeding. -Gastroenterology discussed with family and no further investigation wanted at this time  Acute hypoxic respiratory failure/ Acute on chronic diastolic heart failure -Patient currently requiring 3 L oxygen with yesterday she required 6 L oxygen with ambulation -Placed on IV Lasix with good urine output -Continue to wean supplemental oxygen as possible -Chest x-ray on 05/03/2020 did not show any congestion -Given her age, history of syncope and moderate aortic stenosis, hesitant on putting patient on scheduled Lasix -Monitor intake and output, daily weights, continue fluid restriction   Unspecified CVA in January 2021 and TIA in July 2021 -Recent right CEA on 2-21 for symptomatic right carotid artery stenosis -Patient was on aspirin 325 mg and Plavix 75 mg prior to presentation.  Patient has been resumed on aspirin 81 mg daily.  Cardiology recommends only continue aspirin 81 mg for now and hold off on Plavix till reevaluation as an outpatient. -Continue statin  Elevated troponin -Most likely due to demand ischemia.  Cardiology following and recommending medical management.  Echo with preserved EF of 60 to 65% with grade 2 diastolic dysfunction with moderately elevated pulmonary artery systolic pressure. -Cardiology consulted and appreciated and has now signed off.  Outpatient follow-up.  She has been restarted on metoprolol 12.5 mg twice a day by cardiology  Syncope -Patient had a syncopal episode at assisted fall while ambulating from bathroom with nurse tech.  She got really hot and weak and went down to her knees similar did not hit her head.  She was given 250 cc normal saline because her blood pressure was in the 90s.   -Continue metoprolol as above  Nondisplaced left distal  fibular shaft fracture -Patient started having left ankle pain after  syncopal episode/fall.  X-ray shows findings as above. -Orthopedics following and patient being managed conservatively at this time with weightbearing as tolerated in  CAM boot.  Repeat x-ray did not show any displacement.  Outpatient follow-up with Dr. Doreatha Martin. -PT initially recommended home health however patient states that she is too weak and does not feel that her family can take care of her.  PT now recommending inpatient rehab -CIR consulted and appreciated  Chronic kidney disease, stage IIIb -Stable, continue to monitor BMP  Aortic stenosis -Moderate as per cardiology documentation.  Hypertension -Stable, continue metoprolol  Diabetes mellitus type 2 -Hemoglobin A1c A1c 7.  Amaryl held -Continue insulin sliding scale CBG monitoring  Anxiety disorder -Lorazepam as needed  Generalized deconditioning -PT recommends home health PT however now recommending CIR   Goals of care -Palliative care evaluated the patient during this hospitalization.  Patient remains full code.  DVT Prophylaxis  SCDs  Code Status: Full  Family Communication: None at bedside  Disposition Plan:  Status is: Inpatient  Remains inpatient appropriate because:Inpatient level of care appropriate due to severity of illness. Pending CIR consult.    Dispo:  Patient From: Home  Planned Disposition: CIR  Expected discharge date: 2 days  Medically stable for discharge: No  Consultants Gastroenterology Cardiology Palliative care Inpatient rehab  Procedures  Echocardiogram EGD  Antibiotics   Anti-infectives (From admission, onward)   None      Subjective:   Denise Parrish seen and examined today.  She states she feels very weak and does not feel that she can go home as she does not feel her family will be able to take care of her.  Is considering inpatient rehab.  Does not want to go to skilled nursing facility.  Denies current chest pain or shortness of breath, abdominal pain, nausea  or vomiting, diarrhea or constipation.  Objective:   Vitals:   05/04/20 2122 05/05/20 0520 05/05/20 0830 05/05/20 1128  BP: 113/70 117/74 105/61 114/73  Pulse: 99 97 (!) 103 93  Resp: 18 19 18 18   Temp: 99 F (37.2 C) 98.8 F (37.1 C) 97.8 F (36.6 C) 98.7 F (37.1 C)  TempSrc:  Oral Oral Oral  SpO2: 94% 98% 92% 99%  Weight:  73.5 kg    Height:        Intake/Output Summary (Last 24 hours) at 05/05/2020 1620 Last data filed at 05/05/2020 1508 Gross per 24 hour  Intake 960 ml  Output 1900 ml  Net -940 ml   Filed Weights   05/03/20 0402 05/04/20 0329 05/05/20 0520  Weight: 77 kg 74.6 kg 73.5 kg    Exam  General: Well developed, well nourished, elderly, NAD, appears stated age  HEENT: NCAT, mucous membranes moist.   Cardiovascular: S1 S2 auscultated, SEM, RRR  Respiratory: Clear to auscultation bilaterally with equal chest rise  Abdomen: Soft, nontender, nondistended, + bowel sounds  Extremities: warm dry without cyanosis clubbing. Mild LLE edema  Neuro: AAOx3, nonfocal  Psych: Pleasant, appropriate mood and affect   Data Reviewed: I have personally reviewed following labs and imaging studies  CBC: Recent Labs  Lab 05/01/20 0504 05/02/20 0703 05/03/20 0417 05/04/20 0409 05/05/20 0901  WBC 7.6 8.5 8.7 9.2 8.7  NEUTROABS 4.8 5.7 6.2 5.7 6.2  HGB 9.2* 9.8* 9.6* 10.8* 11.4*  HCT 28.9* 31.0* 30.4* 32.8* 35.4*  MCV 88.7 90.1 89.7 89.1 90.3  PLT 208 207 222 275 290  Basic Metabolic Panel: Recent Labs  Lab 05/01/20 0504 05/02/20 0703 05/03/20 0417 05/04/20 0409 05/05/20 0901  NA 140 139 138 138 134*  K 3.5 3.6 3.9 3.7 3.6  CL 104 104 104 99 96*  CO2 26 25 25 27 26   GLUCOSE 142* 158* 171* 150* 219*  BUN 12 13 18 23 23   CREATININE 1.10* 1.07* 1.16* 1.13* 1.16*  CALCIUM 8.2* 8.1* 8.2* 8.7* 8.9  MG 2.3 2.2 2.1 2.0 1.9   GFR: Estimated Creatinine Clearance: 29.1 mL/min (A) (by C-G formula based on SCr of 1.16 mg/dL (H)). Liver Function Tests: No  results for input(s): AST, ALT, ALKPHOS, BILITOT, PROT, ALBUMIN in the last 168 hours. No results for input(s): LIPASE, AMYLASE in the last 168 hours. No results for input(s): AMMONIA in the last 168 hours. Coagulation Profile: No results for input(s): INR, PROTIME in the last 168 hours. Cardiac Enzymes: No results for input(s): CKTOTAL, CKMB, CKMBINDEX, TROPONINI in the last 168 hours. BNP (last 3 results) No results for input(s): PROBNP in the last 8760 hours. HbA1C: No results for input(s): HGBA1C in the last 72 hours. CBG: Recent Labs  Lab 05/04/20 1121 05/04/20 1626 05/04/20 2122 05/05/20 0612 05/05/20 1124  GLUCAP 208* 145* 164* 153* 200*   Lipid Profile: No results for input(s): CHOL, HDL, LDLCALC, TRIG, CHOLHDL, LDLDIRECT in the last 72 hours. Thyroid Function Tests: No results for input(s): TSH, T4TOTAL, FREET4, T3FREE, THYROIDAB in the last 72 hours. Anemia Panel: No results for input(s): VITAMINB12, FOLATE, FERRITIN, TIBC, IRON, RETICCTPCT in the last 72 hours. Urine analysis:    Component Value Date/Time   COLORURINE STRAW (A) 04/08/2020 1100   APPEARANCEUR CLEAR 04/08/2020 1100   LABSPEC 1.011 04/08/2020 1100   PHURINE 5.0 04/08/2020 1100   GLUCOSEU 150 (A) 04/08/2020 1100   HGBUR NEGATIVE 04/08/2020 1100   BILIRUBINUR NEGATIVE 04/08/2020 1100   KETONESUR NEGATIVE 04/08/2020 1100   PROTEINUR NEGATIVE 04/08/2020 1100   UROBILINOGEN 1.0 05/23/2015 1956   NITRITE NEGATIVE 04/08/2020 1100   LEUKOCYTESUR NEGATIVE 04/08/2020 1100   Sepsis Labs: @LABRCNTIP (procalcitonin:4,lacticidven:4)  ) Recent Results (from the past 240 hour(s))  SARS Coronavirus 2 by RT PCR (hospital order, performed in [place] hospital lab) Nasopharyngeal Nasopharyngeal Swab     Status: None   Collection Time: 04/26/20  1:54 AM   Specimen: Nasopharyngeal Swab  Result Value Ref Range Status   SARS Coronavirus 2 NEGATIVE NEGATIVE Final    Comment: (NOTE) SARS-CoV-2 target nucleic  acids are NOT DETECTED.  The SARS-CoV-2 RNA is generally detectable in upper and lower respiratory specimens during the acute phase of infection. The lowest concentration of SARS-CoV-2 viral copies this assay can detect is 250 copies / mL. A negative result does not preclude SARS-CoV-2 infection and should not be used as the sole basis for treatment or other patient management decisions.  A negative result may occur with improper specimen collection / handling, submission of specimen other than nasopharyngeal swab, presence of viral mutation(s) within the areas targeted by this assay, and inadequate number of viral copies (<250 copies / mL). A negative result must be combined with clinical observations, patient history, and epidemiological information.  Fact Sheet for Patients:   StrictlyIdeas.no  Fact Sheet for Healthcare Providers: BankingDealers.co.za  This test is not yet approved or  cleared by the [place] FDA and has been authorized for detection and/or diagnosis of SARS-CoV-2 by FDA under an Emergency Use Authorization (EUA).  This EUA will remain in effect (meaning this test can be  used) for the duration of the COVID-19 declaration under Section 564(b)(1) of the Act, 21 U.S.C. section 360bbb-3(b)(1), unless the authorization is terminated or revoked sooner.  Performed at [facility], [address], [place], [place] [address]       Radiology Studies: No results found.   Scheduled Meds: . aspirin  81 mg Oral Daily  . atorvastatin  40 mg Oral Daily  . insulin aspart  0-6 Units Subcutaneous TID WC  . metoprolol tartrate  12.5 mg Oral BID  . pantoprazole  40 mg Oral Daily   Continuous Infusions:   LOS: 9 days   Time Spent in minutes   45 minutes  Irie Dowson D.O. on 05/05/2020 at 4:20 PM  Between 7am to 7pm - Please see pager noted on [email or web address]  After 7pm go to [email or web address]  And look for the night  coverage person covering for me after hours  [facility] Office  [phone]

## 2020-05-06 DIAGNOSIS — S82892A Other fracture of left lower leg, initial encounter for closed fracture: Secondary | ICD-10-CM | POA: Diagnosis not present

## 2020-05-06 DIAGNOSIS — F419 Anxiety disorder, unspecified: Secondary | ICD-10-CM | POA: Diagnosis not present

## 2020-05-06 DIAGNOSIS — I248 Other forms of acute ischemic heart disease: Secondary | ICD-10-CM | POA: Diagnosis not present

## 2020-05-06 DIAGNOSIS — K922 Gastrointestinal hemorrhage, unspecified: Secondary | ICD-10-CM | POA: Diagnosis not present

## 2020-05-06 LAB — GLUCOSE, CAPILLARY
Glucose-Capillary: 166 mg/dL — ABNORMAL HIGH (ref 70–99)
Glucose-Capillary: 144 mg/dL — ABNORMAL HIGH (ref 70–99)
Glucose-Capillary: 161 mg/dL — ABNORMAL HIGH (ref 70–99)
Glucose-Capillary: 194 mg/dL — ABNORMAL HIGH (ref 70–99)

## 2020-05-06 NOTE — Progress Notes (Signed)
IP rehab admissions:  I met with patient and gave her rehab booklets today.  Patient tells me that she might be okay to go home Saturday if insurance does not approve CIR.  She says she did well with therapies today.  I notified attending MD of need for insurance peer to peer prior to a final decision regarding possible inpatient rehab admission.  I will follow up once I hear back from insurance case manager with decision.  Call me for questions.  #[phone] 

## 2020-05-06 NOTE — Progress Notes (Signed)
Physical Therapy Treatment Patient Details Name: Denise Parrish MRN: [id number] DOB: 06-Jul-1923 Today's Date: 05/06/2020    History of Present Illness 86 y.o. female was admitted after 5 days of weakness and SOB, demand ischemia and divertiulosis causing GI bleed.  Pt was noted to have cholelithiasis, aortic stenosis.  Fractured left ankle while in hospital but nonoperative treatment per Ortho.  PMHx:  CVA, carotid stenosis, DM, GERD, HTN, facial weakness, atherosclerosis, anxiety, chest pain, heart murmur, emphysema, PNA, osteoporosis, cognitive impairment, R THA    PT Comments    Pt admitted with above diagnosis. Pt was able to ambulate in hallway increasing distance with RW. Still incr O2 need from 2-4L with activity to maintain sats >88% but pt with DOE 2/4 today which was better than last attempt with walking.  Pt frequency updated to 3x week given that her d/c plan is CIR.  Will follow acutely and progress pt as able.  Pt currently with functional limitations due to the deficits listed below (see PT Problem List). Pt will benefit from skilled PT to increase their independence and safety with mobility to allow discharge to the venue listed below.     Follow Up Recommendations  CIR     Equipment Recommendations  None recommended by PT (home O2)    Recommendations for Other Services Rehab consult     Precautions / Restrictions Precautions Precautions: Fall Precaution Comments: O2 via cannula Required Braces or Orthoses: Other Brace (CAM boot left LE) Restrictions LLE Weight Bearing: Weight bearing as tolerated    Mobility  Bed Mobility Overal bed mobility: Needs Assistance Bed Mobility: Supine to Sit;Sit to Supine     Supine to sit: Supervision     General bed mobility comments: incr time to come to EOB but did not need physical assist  Transfers Overall transfer level: Needs assistance Equipment used: Rolling walker (2 wheeled);1 person hand held assist Transfers:  Sit to/from Stand Sit to Stand: Min guard         General transfer comment: min guard A for power up with cues for hand placement  Ambulation/Gait Ambulation/Gait assistance: Min guard Gait Distance (Feet): 80 Feet Assistive device: Rolling walker (2 wheeled) Gait Pattern/deviations: Decreased weight shift to left;Step-through pattern;Wide base of support;Decreased stance time - left Gait velocity: decreased Gait velocity interpretation: <1.31 ft/sec, indicative of household ambulator General Gait Details: min guard for safety, increased cuing for proximity to RW to improve mechanical advantage of UE.  Sats on 2L to 85% therefore incr to 4L keeping sats >88% on 4L with activity.    Stairs             Wheelchair Mobility    Modified Rankin (Stroke Patients Only)       Balance Overall balance assessment: Mild deficits observed, not formally tested                                          Cognition Arousal/Alertness: Awake/alert Behavior During Therapy: WFL for tasks assessed/performed Overall Cognitive Status: Within Functional Limits for tasks assessed                                        Exercises General Exercises - Lower Extremity Long Arc Quad: AROM;Both;15 reps;Seated Hip Flexion/Marching: AROM;Both;15 reps;Seated    General Comments  Pertinent Vitals/Pain Pain Assessment: Faces Faces Pain Scale: Hurts little more Pain Location: left foot Pain Descriptors / Indicators: Aching;Discomfort;Grimacing;Guarding Pain Intervention(s): Limited activity within patient's tolerance;Monitored during session;Repositioned    Home Living                      Prior Function            PT Goals (current goals can now be found in the care plan section) Acute Rehab PT Goals Patient Stated Goal: return home with family to assist Progress towards PT goals: Progressing toward goals    Frequency    Min  3X/week      PT Plan Frequency needs to be updated    Co-evaluation              AM-PAC PT "6 Clicks" Mobility   Outcome Measure  Help needed turning from your back to your side while in a flat bed without using bedrails?: None Help needed moving from lying on your back to sitting on the side of a flat bed without using bedrails?: A Little Help needed moving to and from a bed to a chair (including a wheelchair)?: A Little Help needed standing up from a chair using your arms (e.g., wheelchair or bedside chair)?: A Little Help needed to walk in hospital room?: A Little Help needed climbing 3-5 steps with a railing? : A Little 6 Click Score: 19    End of Session Equipment Utilized During Treatment: Gait belt;Oxygen Activity Tolerance: Patient limited by fatigue Patient left: in chair;with call bell/phone within reach;with chair alarm set Nurse Communication: Mobility status PT Visit Diagnosis: Unsteadiness on feet (R26.81);Other abnormalities of gait and mobility (R26.89);Muscle weakness (generalized) (M62.81)     Time: 1655-3748 PT Time Calculation (min) (ACUTE ONLY): 17 min  Charges:  $Gait Training: 8-22 mins                     Dawn W,PT [place] Pager:  [phone]  Office:  [place] 05/06/2020, 1:24 PM

## 2020-05-06 NOTE — TOC Progression Note (Signed)
Transition of Care [facility]) - Progression Note    Patient Details  Name: Denise Parrish MRN: [id number] Date of Birth: 06-25-1923  Transition of Care [facility]) CM/SW Contact  Zenon Mayo, RN Phone Number: 05/06/2020, 4:47 PM  Clinical Narrative:    NCM spoke with patient and daughter at bedside, they plan to go home with HHPT with [facility], she has a w/chair, walker and bsc at home. Daugther is [place], she will transport her home tomorrow, she may need some oxygen.   Expected Discharge Plan: [place] Barriers to Discharge: Continued Medical Work up  Expected Discharge Plan and Services Expected Discharge Plan: [place]   Discharge Planning Services: CM Consult Post Acute Care Choice: Home Health, Durable Medical Equipment Living arrangements for the past 2 months: Single Family Home                           HH Arranged: PT [place]: [facility] [employer] Date [place]: 05/03/20 Time [place]: 1611 Representative spoke with at [place]: [place] ([place]) Interventions    Readmission Risk Interventions Readmission Risk Prevention Plan 10/22/2019  Post Dischage Appt Complete  Medication Screening Complete  Transportation Screening Complete  Some recent data might be hidden

## 2020-05-06 NOTE — Progress Notes (Signed)
IP rehab admissions - I received a call from insurance carrier that peer to peer was denied.  Patient wound be approved for SNF or HH therapies.  I spoke with patient's daughter.  If daughter wishes to file an appeal with [facility] medicare, she has 72 hours to file.  The phone number is [phone] for the denial appeal and the fax is [phone].  Please feel free to share with patient and daughter as requested.  If patient does well tomorrow, I think the daughter will be open to a discharge home with [facility] therapies.  Call me for questions.  [phone]

## 2020-05-06 NOTE — Progress Notes (Signed)
PROGRESS NOTE    Denise Parrish  TDD:[id number] DOB: 05/19/1923 DOA: 04/26/2020 PCP: [name], MD   Brief Narrative:  HPI on 04/26/2020 by Dr. Tennis Must Denise Parrish is a 86 y.o. female with medical history significant of anxiety, aortic sclerosis, cardiac stenosis, history of chest pain palpitations, cholelithiasis, DJD, type 2 diabetes with neuropathy, emphysema, GERD,/hiatal hernia, hyperlipidemia, hypertension, iron deficiency anemia, mild cognitive impairment, overweight with BMI of 27.79 kg/m, osteoporosis, osteoporosis, pneumonia, sigmoid diverticulosis, history of other nonhemorrhagic CVA who is brought to the emergency department by her daughter due to 5 days associated with pallor and weakness.  The patient has been lightheaded at times with fatigue with minimal exertion.  She has had some dyspnea, chest pressure and palpitations.  She has had some mild epigastric discomfort.  She denies nausea, emesis, constipation, but has had dark loose stools.  She denies headache, fever, chills, sore throat, rhinorrhea, PND, orthopnea or recent pitting edema of the lower extremities.  No dysuria, frequency or hematuria.  No polyuria, polydipsia, polyphagia or blurred vision.  Assessment & Plan   Acute blood loss anemia/ Probable upper GI bleeding -Patient presented with hemoglobin of 7.2, hemoglobin was 11.9 on 04/07/2020.   -She was transfused 2u PRBC, hemoglobin currently 11.4 -Initially placed on Protonix 40 mg IV every 12 hours, now oral dosing daily  -Gastroenterology consulted and appreciated -Status post EGD on 04/28/2020, no evidence of active bleeding.  One medium size gastric polyp removed with biopsy forceps -CT abdomen pelvis done subsequently showed severe sigmoid diverticulosis and cholelithiasis but no intra-abdominal or pelvic pathology -Tagged red cell scan on 04/29/2020 did not show any evidence of active bleeding -Status post capsule endoscopy on 04/30/2020 which  showed limited study as capsule remained in the stomach for more than 11 hours.  Limited evaluation of the duodenum showed no active bleeding. -Gastroenterology discussed with family and no further investigation wanted at this time -Continue to monitor H/H  Acute hypoxic respiratory failure/ Acute on chronic diastolic heart failure -Patient currently requiring 3 L oxygen with yesterday she required 6 L oxygen with ambulation -Placed on IV Lasix with good urine output -Continue to wean supplemental oxygen as possible -Chest x-ray on 05/03/2020 did not show any congestion -Given her age, history of syncope and moderate aortic stenosis, hesitant on putting patient on scheduled Lasix -Monitor intake and output, daily weights, continue fluid restriction   Unspecified CVA in January 2021 and TIA in July 2021 -Recent right CEA on 2-21 for symptomatic right carotid artery stenosis -Patient was on aspirin 325 mg and Plavix 75 mg prior to presentation.  Patient has been resumed on aspirin 81 mg daily.  Cardiology recommends only continue aspirin 81 mg for now and hold off on Plavix till reevaluation as an outpatient. -Continue statin  Elevated troponin -Most likely due to demand ischemia.  Cardiology following and recommending medical management.  Echo with preserved EF of 60 to 65% with grade 2 diastolic dysfunction with moderately elevated pulmonary artery systolic pressure. -Cardiology consulted and appreciated and has now signed off.  Outpatient follow-up.  She has been restarted on metoprolol 12.5 mg twice a day by cardiology  Syncope -Patient had a syncopal episode at assisted fall while ambulating from bathroom with nurse tech.  She got really hot and weak and went down to her knees similar did not hit her head.  She was given 250 cc normal saline because her blood pressure was in the 90s.   -Continue metoprolol as above  Nondisplaced left distal fibular shaft fracture -Patient started  having left ankle pain after syncopal episode/fall.  X-ray shows findings as above. -Orthopedics following and patient being managed conservatively at this time with weightbearing as tolerated in  CAM boot.  Repeat x-ray did not show any displacement.  Outpatient follow-up with Dr. Doreatha Martin. -PT initially recommended home health however patient states that she is too weak and does not feel that her family can take care of her.  PT now recommending inpatient rehab -CIR consulted and appreciated  Chronic kidney disease, stage IIIb -Stable, continue to monitor BMP  Aortic stenosis -Moderate as per cardiology documentation.  Hypertension -Stable, continue metoprolol  Diabetes mellitus type 2 -Hemoglobin A1c A1c 7.  Amaryl held -Continue insulin sliding scale CBG monitoring  Anxiety disorder -Lorazepam as needed  Generalized deconditioning -PT recommends home health PT however now recommending CIR  -Inpatient rehab consulted and pending insurance auth  Goals of care -Palliative care evaluated the patient during this hospitalization.  Patient remains full code.  DVT Prophylaxis  SCDs  Code Status: Full  Family Communication: None at bedside  Disposition Plan:  Status is: Inpatient  Remains inpatient appropriate because:Inpatient level of care appropriate due to severity of illness. Pending CIR insurance auth.   Dispo:  Patient From: Home  Planned Disposition: CIR  Expected discharge date: 2 days  Medically stable for discharge: No  Consultants Gastroenterology Cardiology Palliative care Inpatient rehab  Procedures  Echocardiogram EGD  Antibiotics   Anti-infectives (From admission, onward)   None      Subjective:   [name] seen and examined today.  Patient states she is feeling okay but continues to feel weak.  Does not feel she can go home due to lack of help.  She denies any current chest pain, shortness of breath, abdominal pain, nausea or  vomiting, diarrhea or constipation, dizziness or headache.   Objective:   Vitals:   05/05/20 1128 05/05/20 1941 05/06/20 0435 05/06/20 0806  BP: 114/73 132/74 (!) 122/55 135/74  Pulse: 93 85 73 100  Resp: 18 18 17    Temp: 98.7 F (37.1 C) 98.5 F (36.9 C) 98.7 F (37.1 C)   TempSrc: Oral Oral Oral   SpO2: 99% 100% 100%   Weight:   74.6 kg   Height:        Intake/Output Summary (Last 24 hours) at 05/06/2020 1139 Last data filed at 05/06/2020 1030 Gross per 24 hour  Intake 720 ml  Output 1651 ml  Net -931 ml   Filed Weights   05/04/20 0329 05/05/20 0520 05/06/20 0435  Weight: 74.6 kg 73.5 kg 74.6 kg   Exam  General: Well developed, well nourished, elderly, NAD, appears stated age  HEENT: NCAT, mucous membranes moist.   Cardiovascular: S1 S2 auscultated, SEM, RRR  Respiratory: Clear to auscultation bilaterally with equal chest rise  Abdomen: Soft, nontender, nondistended, + bowel sounds  Extremities: warm dry without cyanosis clubbing.Mild LLE edema  Neuro: AAOx3, nonfocal  Psych: Pleasant, appropriate mood and affect  Data Reviewed: I have personally reviewed following labs and imaging studies  CBC: Recent Labs  Lab 05/01/20 0504 05/02/20 0703 05/03/20 0417 05/04/20 0409 05/05/20 0901  WBC 7.6 8.5 8.7 9.2 8.7  NEUTROABS 4.8 5.7 6.2 5.7 6.2  HGB 9.2* 9.8* 9.6* 10.8* 11.4*  HCT 28.9* 31.0* 30.4* 32.8* 35.4*  MCV 88.7 90.1 89.7 89.1 90.3  PLT 208 207 222 275 325   Basic Metabolic Panel: Recent Labs  Lab 05/01/20 0504 05/02/20 0703 05/03/20  2297 05/04/20 0409 05/05/20 0901  NA 140 139 138 138 134*  K 3.5 3.6 3.9 3.7 3.6  CL 104 104 104 99 96*  CO2 26 25 25 27 26   GLUCOSE 142* 158* 171* 150* 219*  BUN 12 13 18 23 23   CREATININE 1.10* 1.07* 1.16* 1.13* 1.16*  CALCIUM 8.2* 8.1* 8.2* 8.7* 8.9  MG 2.3 2.2 2.1 2.0 1.9   GFR: Estimated Creatinine Clearance: 29.3 mL/min (A) (by C-G formula based on SCr of 1.16 mg/dL (H)). Liver Function Tests: No  results for input(s): AST, ALT, ALKPHOS, BILITOT, PROT, ALBUMIN in the last 168 hours. No results for input(s): LIPASE, AMYLASE in the last 168 hours. No results for input(s): AMMONIA in the last 168 hours. Coagulation Profile: No results for input(s): INR, PROTIME in the last 168 hours. Cardiac Enzymes: No results for input(s): CKTOTAL, CKMB, CKMBINDEX, TROPONINI in the last 168 hours. BNP (last 3 results) No results for input(s): PROBNP in the last 8760 hours. HbA1C: No results for input(s): HGBA1C in the last 72 hours. CBG: Recent Labs  Lab 05/05/20 0612 05/05/20 1124 05/05/20 1704 05/05/20 2109 05/06/20 0609  GLUCAP 153* 200* 143* 215* 166*   Lipid Profile: No results for input(s): CHOL, HDL, LDLCALC, TRIG, CHOLHDL, LDLDIRECT in the last 72 hours. Thyroid Function Tests: No results for input(s): TSH, T4TOTAL, FREET4, T3FREE, THYROIDAB in the last 72 hours. Anemia Panel: No results for input(s): VITAMINB12, FOLATE, FERRITIN, TIBC, IRON, RETICCTPCT in the last 72 hours. Urine analysis:    Component Value Date/Time   COLORURINE STRAW (A) 04/08/2020 1100   APPEARANCEUR CLEAR 04/08/2020 1100   LABSPEC 1.011 04/08/2020 1100   PHURINE 5.0 04/08/2020 1100   GLUCOSEU 150 (A) 04/08/2020 1100   HGBUR NEGATIVE 04/08/2020 1100   BILIRUBINUR NEGATIVE 04/08/2020 1100   KETONESUR NEGATIVE 04/08/2020 1100   PROTEINUR NEGATIVE 04/08/2020 1100   UROBILINOGEN 1.0 05/23/2015 1956   NITRITE NEGATIVE 04/08/2020 1100   LEUKOCYTESUR NEGATIVE 04/08/2020 1100   Sepsis Labs: @LABRCNTIP (procalcitonin:4,lacticidven:4)  ) No results found for this or any previous visit (from the past 240 hour(s)).    Radiology Studies: No results found.   Scheduled Meds: . aspirin  81 mg Oral Daily  . atorvastatin  40 mg Oral Daily  . insulin aspart  0-6 Units Subcutaneous TID WC  . metoprolol tartrate  12.5 mg Oral BID  . pantoprazole  40 mg Oral Daily   Continuous Infusions:   LOS: 10 days    Time Spent in minutes   30 minutes  Lonzie Simmer D.O. on 05/06/2020 at 11:39 AM  Between 7am to 7pm - Please see pager noted on [email or web address]  After 7pm go to [email or web address]  And look for the night coverage person covering for me after hours  [facility] Office  [phone]

## 2020-05-06 NOTE — Social Work (Signed)
SW spoke to pt and pt's dtr, [name], re [facility] denial of CIR after peer-to-peer. Discussed SNF and HH. Pt and dtr refusing SNF at this time but agreeable to [facility]. MD and CM updated.   Wandra Feinstein, MSW, LCSW [phone] (coverage)

## 2020-05-06 NOTE — Progress Notes (Signed)
Was contacted by inpatient rehab admissions that peer-to-peer needed to be conducted for final decision regarding inpatient rehab admission.  Discussed with medical director at [facility], and was told that patient based on her progress and her ability, but need of minimal assistance would be approved for SNF placement and not inpatient rehab.  This was relayed to inpatient rehab admissions.  I have also discussed with TOC possibility of SNF versus home with home health.

## 2020-05-07 ENCOUNTER — Telehealth: Payer: Self-pay

## 2020-05-07 LAB — BASIC METABOLIC PANEL
Anion gap: 12 (ref 5–15)
BUN: 18 mg/dL (ref 8–23)
CO2: 28 mmol/L (ref 22–32)
Calcium: 9.1 mg/dL (ref 8.9–10.3)
Chloride: 98 mmol/L (ref 98–111)
Creatinine, Ser: 1.15 mg/dL — ABNORMAL HIGH (ref 0.44–1.00)
GFR calc Af Amer: 47 mL/min — ABNORMAL LOW (ref >60)
GFR calc non Af Amer: 40 mL/min — ABNORMAL LOW (ref >60)
Glucose, Bld: 169 mg/dL — ABNORMAL HIGH (ref 70–99)
Potassium: 3.6 mmol/L (ref 3.5–5.1)
Sodium: 138 mmol/L (ref 135–145)

## 2020-05-07 LAB — HEMOGLOBIN AND HEMATOCRIT, BLOOD
HCT: 34 % — ABNORMAL LOW (ref 36.0–46.0)
Hemoglobin: 10.9 g/dL — ABNORMAL LOW (ref 12.0–15.0)

## 2020-05-07 LAB — GLUCOSE, CAPILLARY: Glucose-Capillary: 167 mg/dL — ABNORMAL HIGH (ref 70–99)

## 2020-05-07 MED ORDER — METOPROLOL TARTRATE 25 MG PO TABS: 12.5000 mg | ORAL_TABLET | Freq: Two times a day (BID) | ORAL | 0 refills | Status: AC

## 2020-05-07 MED ORDER — ASPIRIN 81 MG PO CHEW: 81.0000 mg | CHEWABLE_TABLET | Freq: Every day | ORAL | Status: DC

## 2020-05-07 NOTE — Progress Notes (Signed)
SATURATION QUALIFICATIONS: (This note is used to comply with regulatory documentation for home oxygen)  Patient Saturations on Room Air at Rest = 95%  Patient Saturations on Room Air while Ambulating = 90%   

## 2020-05-07 NOTE — Discharge Summary (Signed)
Physician Discharge Summary  [name] BDZ:[id number] DOB: 06/27/1923 DOA: 04/26/2020  PCP: [name], MD  Admit date: 04/26/2020 Discharge date: 05/07/2020  Time spent: 45 minutes  Recommendations for Outpatient Follow-up:  Patient will be discharged to home with home health services.  Patient will need to follow up with primary care provider within one week of discharge, repeat CBC and BMP. Follow up with orthopedics, Dr. Doreatha Martin. Patient should continue medications as prescribed.  Patient should follow a heart healthy/carb modified diet.   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 Tennis Must Ronica P Williamsis a 86 y.o.femalewith medical history significant ofanxiety, aortic sclerosis, cardiac stenosis, history of chest pain palpitations, cholelithiasis, DJD, type 2 diabetes with neuropathy, emphysema, GERD,/hiatal hernia, hyperlipidemia, hypertension, iron deficiency anemia, mild cognitive impairment, overweight with BMI of 27.79 kg/m, osteoporosis, osteoporosis, pneumonia, sigmoid diverticulosis, history of other nonhemorrhagic CVA who is brought to the emergency department by her daughter due to 5 days associated with pallor and weakness. The patient has been lightheaded at times with fatigue with minimal exertion. She has had some dyspnea, chest pressure and  palpitations. She has had some mild epigastric discomfort. She denies nausea, emesis, constipation, but has had dark loose stools. She denies headache, fever, chills, sore throat, rhinorrhea, PND, orthopnea or recent pitting edema of the lower extremities. No dysuria, frequency or hematuria. No polyuria, polydipsia, polyphagia or blurred vision.  Hospital Course:  Acute blood loss anemia/ Probable upper GI bleeding -Patient presented with hemoglobin of 7.2, hemoglobin was 11.9 on 04/07/2020.  -She was transfused 2u PRBC, hemoglobin currently 10.4 -Initially placed on Protonix 40 mg IV every 12 hours, now oral dosing daily  -Gastroenterology consulted and appreciated -Status post EGD on 04/28/2020, no evidence of active bleeding.  One medium size gastric polyp removed with biopsy forceps -CT abdomen pelvis done subsequently showed severe sigmoid diverticulosis and cholelithiasis but no intra-abdominal or pelvic pathology -Tagged red cell scan on 04/29/2020 did not show any evidence of active bleeding -Status post capsule endoscopy on 04/30/2020 which showed limited study as capsule remained in the stomach for more than 11 hours.  Limited evaluation of the duodenum showed no active bleeding. -Gastroenterology discussed with family and no further investigation wanted at this time -repeat CBC in one week  Acute hypoxic respiratory failure/ Acute on chronic diastolic heart failure -Patient currently requiring 3 L oxygen with yesterday she required 6 L oxygen with ambulation -Placed on IV Lasix with good urine output -Continue to wean supplemental oxygen as possible -Chest x-ray on 05/03/2020 did not show any congestion -Given her age, history of syncope and moderate aortic stenosis, hesitant on putting patient on scheduled Lasix -Monitor intake and output, daily weights, continue fluid restriction   Unspecified CVA in January 2021 and TIA in July 2021 -Recent right CEA on 2-21 for symptomatic  right carotid artery stenosis -Patient was on aspirin 325 mg and Plavix 75 mg prior to presentation. Patient has been resumed  on aspirin 81 mg daily. Cardiology recommends only continue aspirin 81 mg for now and hold off on Plavix till reevaluation as an outpatient. -Continue statin  Elevated troponin -Most likely due to demand ischemia. Cardiology following and recommending medical management. Echo with preserved EF of 60 to 65% with grade 2 diastolic dysfunction with moderately elevated pulmonary artery systolic pressure. -Cardiology consulted and appreciated and has now signed off. Outpatient follow-up. She has been restarted on metoprolol 12.5 mg twice a day by cardiology  Syncope -Patient had a syncopal episode at assisted fall while ambulating from bathroom with nurse tech. She got really hot and weak and went down to her knees similar did not hit her head. She was given 250 cc normal saline because her blood pressure was in the 90s.  -Continue metoprolol as above  Nondisplaced left distal fibular shaft fracture -Patient started having left ankle pain after syncopal episode/fall. X-ray shows findings as above. -Orthopedics following and patient being managed conservatively at this time with weightbearing as tolerated in CAM boot. Repeat x-ray didnot show any displacement. Outpatient follow-up with Dr. Doreatha Martin. -PT initially recommended home health however patient states that she is too weak and does not feel that her family can take care of her.  PT now recommending inpatient rehab -CIR consulted and appreciated-after doing a peer-to-peer with the Psychologist, occupational, denial for CIR was upheld. Discussed with patient and family. SNF would be approved by insurance however they would like to take the patient home with home health.  Chronic kidney disease, stage IIIb -Stable  Aortic stenosis -Moderate as per cardiology documentation.  Hypertension -Stable,  continue metoprolol  Diabetes mellitus type 2 -Hemoglobin A1c A1c 7. Amaryl held-May restart on discharge  Anxiety disorder -Lorazepam as needed  Generalized deconditioning -PT recommends home health PT however now recommending CIR -Inpatient rehab consulted and pending insurance auth  Goals of care -Palliative care evaluated the patient during this hospitalization. Patient remains full code.  Consultants Gastroenterology Cardiology Palliative care Inpatient rehab  Procedures  Echocardiogram EGD  Discharge Exam: Vitals:   05/07/20 0443 05/07/20 0826  BP: 131/73 129/71  Pulse: 71 92  Resp: 19 18  Temp: 98.4 F (36.9 C) 98.1 F (36.7 C)  SpO2: 100% 99%     General: Well developed, well nourished, elderly, NAD, appears stated age  HEENT: NCAT, mucous membranes moist.  Cardiovascular: S1 S2 auscultated, RRR, SEM  Respiratory: Clear to auscultation bilaterally  Abdomen: Soft, nontender, nondistended, + bowel sounds  Extremities: warm dry without cyanosis clubbing. Mild LLE edema  Neuro: AAOx3,nonfocal   Psych: Pleasant, appropriate mood and affect  Discharge Instructions Discharge Instructions    Discharge instructions   Complete by: As directed    Patient will be discharged to home with home health services.  Patient will need to follow up with primary care provider within one week of discharge, repeat CBC and BMP. Follow up with orthopedics, Dr. Doreatha Martin. Patient should continue medications as prescribed.  Patient should follow a heart healthy/carb modified diet.     Allergies as of 05/07/2020   No Known Allergies     Medication List    STOP taking these medications   aspirin 325 MG tablet Replaced by: aspirin 81 MG chewable tablet   clopidogrel 75 MG tablet Commonly known as: PLAVIX     TAKE these medications   ALPRAZolam 0.5 MG tablet Commonly known as: XANAX Take 1 tablet (0.5 mg total) by mouth 3 (three) times daily. What changed:  when to take this  additional instructions   aspirin 81 MG chewable tablet Chew 1 tablet (81 mg total) by mouth daily. Start taking on: May 08, 2020 Replaces: aspirin 325 MG tablet   atorvastatin 40 MG tablet Commonly known as: LIPITOR Take 1 tablet (40 mg total) by mouth daily at 6 PM.   glimepiride 1 MG tablet Commonly known as: AMARYL Take 1 mg by mouth daily with breakfast.   meclizine 12.5 MG tablet Commonly known as: ANTIVERT Take 1 tablet (12.5 mg total) by mouth 3 (three) times daily as needed for dizziness.   metoprolol tartrate 25 MG tablet Commonly known as: LOPRESSOR Take 0.5 tablets (12.5 mg total) by mouth 2 (two) times daily. What changed: how much to take   multivitamin-lutein Caps capsule Take 1 capsule by mouth daily.   omeprazole 20 MG capsule Commonly known as: PRILOSEC Take 20 mg by mouth Daily.   VISINE DRY EYE OP Apply 1 drop to eye 3 (three) times daily as needed (for dry eye relief).   vitamin C with rose hips 500 MG tablet Take 500 mg by mouth daily.   VITAMIN E PO Take 1 tablet by mouth daily.      No Known Allergies  Follow-up Information    [facility] Follow up on 05/27/2020.   Specialty: Cardiology Why: @ 9AM Contact information: [place]       [name], MD. Daphane Shepherd on 05/21/2020.   Specialty: Family Medicine Why: with repeat cbc/bmp @11 :20am Contact information: [address] [place] [address] [phone]        Fay Records, MD .   Specialty: Cardiology Contact information: [address] [address] [phone]        Shona Needles, MD. Schedule an appointment as soon as possible for a visit in 1 week(s).   Specialty: Orthopedic Surgery Contact information: [place] [address] [phone]        [facility] Follow up.   Specialty: [place] Why: the office will call to schedule physical therapy visits  [facility], [place] information: [place] [address] [phone]                The results of significant diagnostics from this hospitalization (including imaging, microbiology, ancillary and laboratory) are listed below for reference.    Significant Diagnostic Studies: CT Angio Head W or Wo Contrast  Result Date: 04/07/2020 CLINICAL DATA:  Acute neuro deficit.  Left facial droop. EXAM: CT ANGIOGRAPHY HEAD AND NECK TECHNIQUE: Multidetector CT imaging of the head and neck was performed using the standard protocol during bolus administration of intravenous contrast. Multiplanar CT image reconstructions and MIPs were obtained to evaluate the vascular anatomy. Carotid stenosis measurements (when applicable) are obtained utilizing NASCET criteria, using the distal internal carotid diameter as the denominator. CONTRAST:  63m OMNIPAQUE IOHEXOL 350 MG/ML SOLN COMPARISON:  CT head 04/07/2020.  MRI 12/10/2019.  CTA 12/14/2019 FINDINGS: CTA NECK FINDINGS Aortic arch: Atherosclerotic calcification in the aortic arch. Proximal great vessels widely patent without stenosis. Right carotid system: Postop right carotid endarterectomy. No significant stenosis, pseudoaneurysm or thrombus. No significant stenosis. Left carotid system: Atherosclerotic calcification left carotid bifurcation without significant stenosis. Vertebral arteries: Both vertebral arteries widely patent to the basilar. Left vertebral artery is dominant. Skeleton: ACDF with solid fusion C4-5. No acute skeletal abnormality. Multilevel  degenerative change. Other neck: No mass, adenopathy, or fluid collection in the neck. Upper chest: Apical scarring bilaterally. Apical pleural calcifications. No interval change. Review of the MIP images confirms the above findings CTA HEAD FINDINGS Anterior circulation: Right cavernous carotid widely patent.  Atherosclerotic calcification left cavernous carotid with mild stenosis. Anterior and middle cerebral arteries widely patent without stenosis or large vessel occlusion. Posterior circulation: Both vertebral arteries patent to the basilar. PICA patent bilaterally. Basilar widely patent. Superior cerebellar and posterior cerebral arteries patent bilaterally without stenosis or large vessel occlusion. Fetal origin right posterior cerebral artery. Venous sinuses: Normal venous enhancement. Anatomic variants: None Review of the MIP images confirms the above findings IMPRESSION: 1. Right carotid  endarterectomy is widely patent. 2. Mild atherosclerotic calcification left carotid bifurcation without significant stenosis. Mild stenosis left cavernous carotid due to atherosclerotic calcification. 3. No intracranial large vessel occlusion or significant  stenosis. Electronically Signed   By: Franchot Gallo M.D.   On: 04/07/2020 16:32   DG Tibia/Fibula Left  Result Date: 05/02/2020 CLINICAL DATA:  Fibula fracture EXAM: LEFT TIBIA AND FIBULA - 2 VIEW COMPARISON:  04/30/2020 ankle FINDINGS: There is an oblique minimally displaced fracture of the distal shaft of the left fibula. Alignment is unchanged from 04/30/2020 ankle radiograph. No additional fracture identified. IMPRESSION: Stable alignment of minimally displaced oblique fracture of the distal shaft the left fibula. Electronically Signed   By: Macy Mis M.D.   On: 05/02/2020 07:21   DG Ankle 2 Views Left  Result Date: 04/30/2020 CLINICAL DATA:  [place]. Injured left ankle. EXAM: LEFT ANKLE - 2 VIEW COMPARISON:  None. FINDINGS: There is an oblique coursing relatively nondisplaced fracture involving the distal fibular shaft well above the ankle mortise. No fracture of the tibia is identified. The mid and hindfoot bony structures are intact. Calcaneal spurring changes are noted. IMPRESSION: Relatively nondisplaced distal fibular shaft fracture. Electronically Signed    By: Marijo Sanes M.D.   On: 04/30/2020 08:12   CT Angio Neck W and/or Wo Contrast  Result Date: 04/07/2020 CLINICAL DATA:  Acute neuro deficit.  Left facial droop. EXAM: CT ANGIOGRAPHY HEAD AND NECK TECHNIQUE: Multidetector CT imaging of the head and neck was performed using the standard protocol during bolus administration of intravenous contrast. Multiplanar CT image reconstructions and MIPs were obtained to evaluate the vascular anatomy. Carotid stenosis measurements (when applicable) are obtained utilizing NASCET criteria, using the distal internal carotid diameter as the denominator. CONTRAST:  64m OMNIPAQUE IOHEXOL 350 MG/ML SOLN COMPARISON:  CT head 04/07/2020.  MRI 12/10/2019.  CTA 12/14/2019 FINDINGS: CTA NECK FINDINGS Aortic arch: Atherosclerotic calcification in the aortic arch. Proximal great vessels widely patent without stenosis. Right carotid system: Postop right carotid endarterectomy. No significant stenosis, pseudoaneurysm or thrombus. No significant stenosis. Left carotid system: Atherosclerotic calcification left carotid bifurcation without significant stenosis. Vertebral arteries: Both vertebral arteries widely patent to the basilar. Left vertebral artery is dominant. Skeleton: ACDF with solid fusion C4-5. No acute skeletal abnormality. Multilevel degenerative change. Other neck: No mass, adenopathy, or fluid collection in the neck. Upper chest: Apical scarring bilaterally. Apical pleural calcifications. No interval change. Review of the MIP images confirms the above findings CTA HEAD FINDINGS Anterior circulation: Right cavernous carotid widely patent. Atherosclerotic calcification left cavernous carotid with mild stenosis. Anterior and middle cerebral arteries widely patent without stenosis or large vessel occlusion. Posterior circulation: Both vertebral arteries patent to the basilar. PICA patent bilaterally. Basilar widely patent. Superior cerebellar and posterior cerebral arteries  patent bilaterally  without stenosis or large vessel occlusion. Fetal origin right posterior cerebral artery. Venous sinuses: Normal venous enhancement. Anatomic variants: None Review of the MIP images confirms the above findings IMPRESSION: 1. Right carotid  endarterectomy is widely patent. 2. Mild atherosclerotic calcification left carotid bifurcation without significant stenosis. Mild stenosis left cavernous carotid due to atherosclerotic calcification. 3. No intracranial large vessel occlusion or significant  stenosis. Electronically Signed   By: Franchot Gallo M.D.   On: 04/07/2020 16:32   MR BRAIN WO CONTRAST  Result Date: 04/07/2020 CLINICAL DATA:  Transient ischemic attack. Left facial weakness and numbness beginning 1500 hours today. History of right carotid endarterectomy. EXAM: MRI HEAD WITHOUT CONTRAST TECHNIQUE: Multiplanar, multiecho pulse sequences of the brain and surrounding structures were obtained without intravenous contrast. COMPARISON:  CT angiography same day.  MRI 12/14/2019 FINDINGS: Brain: Diffusion imaging does not show any acute or subacute infarction. Old small vessel infarction in the left cerebellum. Chronic small-vessel ischemic changes throughout the pons. Marked chronic small-vessel ischemic changes throughout the cerebral hemispheric white matter. Old right frontoparietal cortical and subcortical infarction. No mass lesion, hemorrhage, hydrocephalus or extra-axial collection. Vascular: Major vessels at the base of the brain show flow. Skull and upper cervical spine: Negative Sinuses/Orbits: Clear/normal Other: None IMPRESSION: No acute finding by MRI. Chronic small-vessel ischemic changes throughout the brain as outlined above. Old right frontoparietal cortical infarction. Electronically Signed   By: Nelson Chimes M.D.   On: 04/07/2020 18:46   NM GI Blood Loss  Result Date: 04/29/2020 CLINICAL DATA:  GI bleed EXAM: NUCLEAR MEDICINE GASTROINTESTINAL BLEEDING SCAN TECHNIQUE:  Sequential abdominal images were obtained following intravenous administration of Tc-45mlabeled red blood cells. RADIOPHARMACEUTICALS:  24.6 mCi Tc-948mertechnetate in-vitro labeled red cells. COMPARISON:  CT 04/28/2020 FINDINGS: Imaging was performed for 2 hours. There is no radiotracer accumulation to suggest or localize active GI bleed. IMPRESSION: No visible active GI bleed. Electronically Signed   By: KeRolm Baptise.D.   On: 04/29/2020 16:01   CT ABDOMEN PELVIS W CONTRAST  Result Date: 04/28/2020 CLINICAL DATA:  [age]ear old female with GI bleed. EXAM: CT ABDOMEN AND PELVIS WITH CONTRAST TECHNIQUE: Multidetector CT imaging of the abdomen and pelvis was performed using the standard protocol following bolus administration of intravenous contrast. CONTRAST:  8082mMNIPAQUE IOHEXOL 300 MG/ML  SOLN COMPARISON:  CT abdomen pelvis dated 10/05/2019. FINDINGS: Lower chest: The visualized lung bases are clear. There is calcification of the mitral annulus. No intra-abdominal free air or free fluid. Hepatobiliary: The liver is unremarkable. No intrahepatic biliary ductal dilatation. There is a 2.5 cm noncalcified stone in the gallbladder. No pericholecystic fluid or evidence of acute cholecystitis by CT. Pancreas: Unremarkable. No pancreatic ductal dilatation or surrounding inflammatory changes. Spleen: Normal in size without focal abnormality. Adrenals/Urinary Tract: Mild left adrenal nodularity. The right adrenal gland is unremarkable. There is no hydronephrosis on either side. There is an extrarenal pelvis with mild pelviectasis on the right. There is symmetric enhancement and excretion of contrast by both kidneys. Moderate bilateral renal parenchyma atrophy with cortical scarring and irregularity. There is a 3 mm nonobstructing left renal upper pole calculus. Multiple bilateral renal cysts as well as several subcentimeter hypodense lesions which are too small to characterize. The visualized ureters and urinary  bladder appear unremarkable. Stomach/Bowel: There is severe sigmoid diverticulosis without active inflammatory changes. Evaluation for GI bleed is very limited, almost nondiagnostic due to presence of oral contrast. There is no bowel obstruction or active inflammation. The appendix is normal. Vascular/Lymphatic: Moderate  aortoiliac atherosclerotic disease. The IVC is unremarkable. No portal venous gas. There is no adenopathy. Reproductive: The uterus and ovaries are grossly unremarkable. Other: None Musculoskeletal: Osteopenia with degenerative changes of the spine. Total right hip arthroplasty. No acute osseous pathology. IMPRESSION: 1. No acute intra-abdominal or pelvic pathology. 2. Severe sigmoid diverticulosis. No bowel obstruction. Normal appendix. 3. Cholelithiasis. 4. Aortic Atherosclerosis (ICD10-I70.0). Electronically Signed   By: Anner Crete M.D.   On: 04/28/2020 19:22   DG CHEST PORT 1 VIEW  Result Date: 05/03/2020 CLINICAL DATA:  Dyspnea. EXAM: PORTABLE CHEST 1 VIEW COMPARISON:  PA and lateral chest 10/16/2019. Single-view of the chest 10/05/2019. CT chest 10/05/2019. FINDINGS: Heart size is normal. Aortic atherosclerosis is seen. No consolidative process, pneumothorax or effusion. No acute or focal bony abnormality. IMPRESSION: No acute disease. Aortic Atherosclerosis (ICD10-I70.0). Electronically Signed   By: Inge Rise M.D.   On: 05/03/2020 13:25   DG Tibia/Fibula Left Port  Result Date: 05/03/2020 CLINICAL DATA:  Fracture of distal fibula. EXAM: PORTABLE LEFT TIBIA AND FIBULA - 2 VIEW COMPARISON:  None. FINDINGS: Acute fracture involving the distal diaphysis of the fibula is again noted and appears unchanged in appearance from previous exam. A second, nondisplaced fracture is noted involving the proximal diaphysis of the fibula. IMPRESSION: No change in appearance of acute fractures involving the distal diaphysis of the fibula and proximal diaphysis of the fibula. Electronically  Signed   By: Kerby Moors M.D.   On: 05/03/2020 14:15   DG Ankle Left Port  Result Date: 05/02/2020 CLINICAL DATA:  Ankle pain. EXAM: PORTABLE LEFT ANKLE - 2 VIEW COMPARISON:  Same day tibia and fibula radiographs. FINDINGS: An obliquely oriented acute fracture of the distal fibula diaphysis is seen in unchanged alignment (Weber type C). An overlying sock obscures bony detail. Posterior and plantar calcaneal enthesophytes are noted. IMPRESSION: Unchanged alignment of the acute fracture of the distal fibula diaphysis (Weber type C). Electronically Signed   By: Zerita Boers M.D.   On: 05/02/2020 12:39   ECHOCARDIOGRAM COMPLETE  Result Date: 04/27/2020    ECHOCARDIOGRAM REPORT   Patient Name:   CHALEE HIROTA Date of Exam: 04/26/2020 Medical Rec #:  [id number]          Height:       65.0 in Accession #:    [id number]         Weight:       167.0 lb Date of Birth:  1923/09/13           BSA:          1.832 m Patient Age:    86 years           BP:           119/58 mmHg Patient Gender: F                  HR:           70 bpm. Exam Location:  [facility] Procedure: 2D Echo Indications:    Abnormal ECG 794.31 / R94.31  History:        Patient has prior history of Echocardiogram examinations, most                 recent 10/17/2019. TIA and Stroke; Risk Factors:Diabetes,                 Dyslipidemia, Hypertension and Non-Smoker.  Sonographer:    Leavy Cella RDCS (AE) Referring Phys: [place]  1. Left ventricular ejection fraction, by estimation, is 60 to 65%. The left ventricle has normal function. The left ventricle has no regional wall motion abnormalities. There is mild left ventricular hypertrophy. Left ventricular diastolic parameters are consistent with Grade II diastolic dysfunction (pseudonormalization). Elevated left atrial pressure.  2. Right ventricular systolic function is normal. The right ventricular size is normal. There is moderately elevated pulmonary artery systolic  pressure.  3. The mitral valve is abnormal. Mild mitral valve regurgitation.  4. AV is thickened, calcified with restricted motion. Peak and mean gradients through the vlave are 49 and 28 mm respectively consistent with moderate AS. Dimensionless index is 0.37. Compareed to echo from Jan , gradients are decreased.. The aortic valve is abnormal. Aortic valve regurgitation is not visualized. Mild aortic valve sclerosis is present, with no evidence of aortic valve stenosis.  5. The inferior vena cava is normal in size with greater than 50% respiratory variability, suggesting right atrial pressure of 3 mmHg. FINDINGS  Left Ventricle: Left ventricular ejection fraction, by estimation, is 60 to 65%. The left ventricle has normal function. The left ventricle has no regional wall motion abnormalities. The left ventricular internal cavity size was small. There is mild left ventricular hypertrophy. Left ventricular diastolic parameters are consistent with Grade II diastolic dysfunction (pseudonormalization). Elevated left atrial pressure. Right Ventricle: The right ventricular size is normal. Right vetricular wall thickness was not assessed. Right ventricular systolic function is normal. There is moderately elevated pulmonary artery systolic pressure. The tricuspid regurgitant velocity is  3.27 m/s, and with an assumed right atrial pressure of 10 mmHg, the estimated right ventricular systolic pressure is 23.5 mmHg. Left Atrium: Left atrial size was normal in size. Right Atrium: Right atrial size was normal in size. Pericardium: There is no evidence of pericardial effusion. Mitral Valve: The mitral valve is abnormal. There is mild thickening of the mitral valve leaflet(s). Moderate mitral annular calcification. Mild mitral valve regurgitation. MV peak gradient, 18.5 mmHg. The mean mitral valve gradient is 6.0 mmHg. Tricuspid Valve: The tricuspid valve is normal in structure. Tricuspid valve regurgitation is mild. Aortic Valve:  AV is thickened, calcified with restricted motion. Peak and mean gradients through the vlave are 49 and 28 mm respectively consistent with moderate AS. Dimensionless index is 0.37. Compareed to echo from Jan , gradients are decreased. The aortic valve is abnormal. Aortic valve regurgitation is not visualized. Mild aortic valve sclerosis is present, with no evidence of aortic valve stenosis. Aortic valve mean gradient measures 28.3 mmHg. Aortic valve peak gradient measures 48.7 mmHg. Pulmonic Valve: The pulmonic valve was not well visualized. Pulmonic valve regurgitation is not visualized. No evidence of pulmonic stenosis. Aorta: The aortic root is normal in size and structure. Venous: The inferior vena cava is normal in size with greater than 50% respiratory variability, suggesting right atrial pressure of 3 mmHg. IAS/Shunts: The interatrial septum was not assessed.  LEFT VENTRICLE PLAX 2D LVIDd:         3.33 cm Diastology LVIDs:         2.22 cm LV e' lateral:   5.70 cm/s LV PW:         1.35 cm LV E/e' lateral: 35.1 LV IVS:        1.15 cm LV e' medial:    4.88 cm/s                        LV E/e' medial:  41.0  RIGHT VENTRICLE RV S  prime:     16.20 cm/s TAPSE (M-mode): 2.4 cm LEFT ATRIUM             Index       RIGHT ATRIUM           Index LA diam:        3.90 cm 2.13 cm/m  RA Area:     13.20 cm LA Vol (A2C):   31.9 ml 17.41 ml/m RA Volume:   32.40 ml  17.68 ml/m LA Vol (A4C):   38.8 ml 21.18 ml/m LA Biplane Vol: 36.2 ml 19.76 ml/m  AORTIC VALVE AV Vmax:           349.00 cm/s AV Vmean:          251.667 cm/s AV VTI:            0.812 m AV Peak Grad:      48.7 mmHg AV Mean Grad:      28.3 mmHg LVOT Vmax:         133.33 cm/s LVOT Vmean:        93.867 cm/s LVOT VTI:          0.299 m LVOT/AV VTI ratio: 0.37  AORTA Ao Root diam: 3.40 cm MITRAL VALVE                TRICUSPID VALVE MV Area (PHT): 4.15 cm     TR Peak grad:   42.8 mmHg MV Peak grad:  18.5 mmHg    TR Vmax:        327.00 cm/s MV Mean grad:  6.0 mmHg MV  Vmax:       2.15 m/s     SHUNTS MV Vmean:      113.0 cm/s   Systemic VTI: 0.30 m MV Decel Time: 183 msec MR Peak grad: 164.9 mmHg MR Mean grad: 88.0 mmHg MR Vmax:      642.00 cm/s MR Vmean:     421.0 cm/s MV E velocity: 200.00 cm/s MV A velocity: 196.00 cm/s MV E/A ratio:  1.02 Dorris Carnes MD Electronically signed by Dorris Carnes MD Signature Date/Time: 04/27/2020/12:16:06 AM    Final    CT HEAD CODE STROKE WO CONTRAST  Result Date: 04/07/2020 CLINICAL DATA:  Code stroke. Acute onset of left-sided mouth and facial droop and numbness beginning at 3 p.m. EXAM: CT HEAD WITHOUT CONTRAST TECHNIQUE: Contiguous axial images were obtained from the base of the skull through the vertex without intravenous contrast. COMPARISON:  CT head without contrast 12/14/2019 FINDINGS: Brain: Remote right frontal infarct is stable with associated white matter disease. Diffuse white matter disease is stable. Basal ganglia are stable. Remote lacunar infarcts of the thalami are unchanged. Insular ribbon is normal bilaterally. Remote lacunar infarct of the left cerebellum is stable. Vascular: Atherosclerotic calcifications are present within the cavernous internal carotid arteries bilaterally. No hyperdense vessel is present. Skull: Calvarium is intact. No focal lytic or blastic lesions are present. No significant extracranial soft tissue lesion is present. Sinuses/Orbits: The paranasal sinuses and mastoid air cells are clear. Bilateral lens replacements are noted. Globes and orbits are otherwise unremarkable. ASPECTS [facility] Stroke Program Early CT Score) - Ganglionic level infarction (caudate, lentiform nuclei, internal capsule, insula, M1-M3 cortex): 7/7 - Supraganglionic infarction (M4-M6 cortex): 3/3 Total score (0-10 with 10 being normal): 10/10 IMPRESSION: 1. Stable remote infarcts of the right frontal lobe, bilateral thalami, and left cerebellum. 2. No acute intracranial abnormality. 3. Stable atrophy and white matter disease. 4.  ASPECTS is 10/10  These results were called by telephone at the time of interpretation on 04/07/2020 at 4:16 pm to provider [facility] , who verbally acknowledged these results. Electronically Signed   By: San Morelle M.D.   On: 04/07/2020 16:16    Microbiology: No results found for this or any previous visit (from the past 240 hour(s)).   Labs: Basic Metabolic Panel: Recent Labs  Lab 05/01/20 0504 05/01/20 0504 05/02/20 0703 05/03/20 0417 05/04/20 0409 05/05/20 0901 05/07/20 0528  NA 140   < > 139 138 138 134* 138  K 3.5   < > 3.6 3.9 3.7 3.6 3.6  CL 104   < > 104 104 99 96* 98  CO2 26   < > 25 25 27 26 28   GLUCOSE 142*   < > 158* 171* 150* 219* 169*  BUN 12   < > 13 18 23 23 18   CREATININE 1.10*   < > 1.07* 1.16* 1.13* 1.16* 1.15*  CALCIUM 8.2*   < > 8.1* 8.2* 8.7* 8.9 9.1  MG 2.3  --  2.2 2.1 2.0 1.9  --    < > = values in this interval not displayed.   Liver Function Tests: No results for input(s): AST, ALT, ALKPHOS, BILITOT, PROT, ALBUMIN in the last 168 hours. No results for input(s): LIPASE, AMYLASE in the last 168 hours. No results for input(s): AMMONIA in the last 168 hours. CBC: Recent Labs  Lab 05/01/20 0504 05/01/20 0504 05/02/20 0703 05/03/20 0417 05/04/20 0409 05/05/20 0901 05/07/20 0528  WBC 7.6  --  8.5 8.7 9.2 8.7  --   NEUTROABS 4.8  --  5.7 6.2 5.7 6.2  --   HGB 9.2*   < > 9.8* 9.6* 10.8* 11.4* 10.9*  HCT 28.9*   < > 31.0* 30.4* 32.8* 35.4* 34.0*  MCV 88.7  --  90.1 89.7 89.1 90.3  --   PLT 208  --  207 222 275 290  --    < > = values in this interval not displayed.   Cardiac Enzymes: No results for input(s): CKTOTAL, CKMB, CKMBINDEX, TROPONINI in the last 168 hours. BNP: BNP (last 3 results) No results for input(s): BNP in the last 8760 hours.  ProBNP (last 3 results) No results for input(s): PROBNP in the last 8760 hours.  CBG: Recent Labs  Lab 05/06/20 0609 05/06/20 1152 05/06/20 1641 05/06/20 2120 05/07/20 0625  GLUCAP  166* 144* 161* 194* 167*       Signed:  Joseth Weigel  [facility] 05/07/2020, 10:12 AM

## 2020-05-07 NOTE — TOC Transition Note (Addendum)
Transition of Care [facility]) - CM/SW Discharge Note   Patient Details  Name: Denise Parrish MRN: [id number] Date of Birth: March 25, 1923  Transition of Care [facility]) CM/SW Contact:  Zenon Mayo, RN Phone Number: 05/07/2020, 9:34 AM   Clinical Narrative:    Patient is for possible dc today, she is set up with [facility] for [place], [place] notified Tiffany with [facility].  Daughter will transport her home today at dc.  She does not need home oxygen per ambulatory sats.   Final next level of care: [place] Barriers to Discharge: No Barriers Identified   Patient Goals and CMS Choice Patient states their goals for this hospitalization and ongoing recovery are:: get better CMS Medicare.gov Compare Post Acute Care list provided to:: Patient Choice offered to / list presented to : Patient, Adult Children  Discharge Placement                       Discharge Plan and Services   Discharge Planning Services: CM Consult Post Acute Care Choice: Home Health, Durable Medical Equipment                    HH Arranged: PT,AIDE [place]: [facility] [employer] Date [place]: 05/03/20 Time [place]: 1611 Representative spoke with at [place]: [place] ([place]) Interventions     Readmission Risk Interventions Readmission Risk Prevention Plan 10/22/2019  Post Dischage Appt Complete  Medication Screening Complete  Transportation Screening Complete  Some recent data might be hidden

## 2020-05-07 NOTE — Progress Notes (Signed)
Pt ambulated in the hallway at room air. Pt denied shortness of breath. Pt desatted to 90% with rapid recovery to 92%.  Pt is currently lying in bed at room air with current oxygen saturation 95% and denies SOB. Call bell within reach.

## 2020-05-07 NOTE — Progress Notes (Signed)
Discharge and medication education given to pt and pt's daughter with teach back. Questions answered.  Peripheral iv removed and dressing applied.  Pt's belongings with pt: clothes, shoes, bilateral hearing aid, upper and lower dentures. Pt was transported  to main entrance with wheelchair by nurse tech.

## 2020-05-18 ENCOUNTER — Encounter: Payer: Self-pay | Admitting: Neurology

## 2020-05-18 ENCOUNTER — Ambulatory Visit (INDEPENDENT_AMBULATORY_CARE_PROVIDER_SITE_OTHER): Payer: Medicare Other | Admitting: Neurology

## 2020-05-18 VITALS — BP 105/60 | HR 87

## 2020-05-18 DIAGNOSIS — I639 Cerebral infarction, unspecified: Secondary | ICD-10-CM

## 2020-05-18 NOTE — Progress Notes (Signed)
Chief Complaint  Patient presents with  . New Patient (Initial Visit)    She is here with her daughter, Izora Parrish. Since January 2021, reports having one heart attack, TIA and stroke. She also suffered a broken left leg due to a fall. She still has some residual, mild right arm weakness. She is working with home PT weekly. She is taking aspirin 74m, one tablet daily.  .Denise KitchenPCP     KLeslie Andrea MD     HISTORICAL  MTalmage CoinWStrainis a 86year old female, seen in request by her primary care physician Dr. KLeslie Andreato follow-up stroke, she is accompanied by her daughter Denise Galaat today's visit on May 18, 2020.  I reviewed and summarized the referring note. Hypertension Hyperlipidemia  She had a sudden onset of slurred speech on October 16 2019, was diagnosed with acute stroke at right MCA, I personally reviewed MRI of the brain on October 16, 2019, patchy acute infarction in the right MCA territory, involving right frontal orbicular, mid frontal gyri, underlying moderate to severe supratentorium small vessel disease  CT angiogram head and neck showed right carotid bifurcation stenosis up to 75%, intracranial atherosclerotic disease,  She underwent right carotid endarterectomy with bovine pericardial patch angioplasty on October 21, 2019 by Dr. CMarsa Aris she was treated with aspirin 325 mg + Plavix 75 mg  She was readmitted to the hospital in August for weakness, chest pressure, dark loose stool, was diagnosed with acute blood loss anemia, probable upper GI bleeding, hemoglobin dropped from baseline 11.9 to 7.2, require blood transfusion, treated with Protonix 40 mg IV, CT of abdomen showed severe sigmoid diverticulosis, cholelithiasis, Capsule endoscopy showed no active GI bleeding, Plavix was stopped, she was put on aspirin 81 mg along  Also suffered acute MI on April 26, 2020, with elevated troponin up to 1891  Echocardiogram in August 2021 ejection fraction 60  to 65%, normal left ventricular function, no regional wall motion abnormality, mild left ventricular hypertrophy, grade 2 diastolic dysfunction   REVIEW OF SYSTEMS: Full 14 system review of systems performed and notable only for as above All other review of systems were negative.  ALLERGIES: No Known Allergies  HOME MEDICATIONS: Current Outpatient Medications  Medication Sig Dispense Refill  . ALPRAZolam (XANAX) 0.5 MG tablet Take 1 tablet (0.5 mg total) by mouth 3 (three) times daily. (Patient taking differently: Take 0.5 mg by mouth at bedtime. Occasionally 1 tablet during the day if needed.) 15 tablet 0  . aspirin 81 MG chewable tablet Chew 1 tablet (81 mg total) by mouth daily.    .Denise Kitchenatorvastatin (LIPITOR) 40 MG tablet Take 1 tablet (40 mg total) by mouth daily at 6 PM. 30 tablet 2  . glimepiride (AMARYL) 1 MG tablet Take 1 mg by mouth daily with breakfast.    . Glycerin-Hypromellose-PEG 400 (VISINE DRY EYE OP) Apply 1 drop to eye 3 (three) times daily as needed (for dry eye relief).     . meclizine (ANTIVERT) 12.5 MG tablet Take 1 tablet (12.5 mg total) by mouth 3 (three) times daily as needed for dizziness. 20 tablet 0  . metoprolol tartrate (LOPRESSOR) 25 MG tablet Take 0.5 tablets (12.5 mg total) by mouth 2 (two) times daily. 30 tablet 0  . multivitamin-lutein (OCUVITE-LUTEIN) CAPS capsule Take 1 capsule by mouth daily.    .Denise Kitchenomeprazole (PRILOSEC) 20 MG capsule Take 20 mg by mouth Daily.     No current facility-administered medications for this visit.    PAST  MEDICAL HISTORY: Past Medical History:  Diagnosis Date  . Anxiety   . Aortic sclerosis    Long-standing heart murmur  . Carotid stenosis, right 10/16/2019   75% on CTA neck  . Chest pain 2006   +palpitations; minimal CAD at cath in 2005; normal EF; negative stress echo in 09/2008  . Cholelithiasis    on u/s 09/2011  . Degenerative joint disease    Right THA in 1995; cervical discectomy and fusion-2001  . Diabetes ([place])    . Diabetes mellitus, type 2 (HCC)    + neuropathy  . Emphysema   . Gastroesophageal reflux disease   . Heart attack ([place])   . Hiatal hernia   . Hyperlipidemia   . Hypertension   . IDA (iron deficiency anemia)    labs 09/2011  . Mild cognitive impairment   . Obesity   . Osteoporosis   . Pneumonia   . Sigmoid diverticulosis   . Small bowel lesion    On Given's capsule; Dr Kathleene Hazel 07/2012, no further w/u needed  . Stroke ([place])   . TIA (transient ischemic attack)     PAST SURGICAL HISTORY: Past Surgical History:  Procedure Laterality Date  . ANTERIOR CERVICAL DISCECTOMY  2001   C4-5, allograft, fixation  . BREAST LUMPECTOMY     benign  . CATARACT EXTRACTION W/ INTRAOCULAR LENS IMPLANT  2007   Left  . COLONOSCOPY  2006  . COLONOSCOPY  04/03/12   Dr. Ok Edwards diverticulosis, negative microscopic  colitis   . ENDARTERECTOMY Right 10/21/2019   Procedure: ENDARTERECTOMY CAROTID RIGHT;  Surgeon: Waynetta Sandy, MD;  Location: [place];  Service: Vascular;  Laterality: Right;  . ESOPHAGOGASTRODUODENOSCOPY  04/03/12   Dr. Jennet Maduro hernia, chronic gastritis on bx  . ESOPHAGOGASTRODUODENOSCOPY (EGD) WITH PROPOFOL N/A 04/28/2020   Procedure: ESOPHAGOGASTRODUODENOSCOPY (EGD) WITH PROPOFOL;  Surgeon: Otis Brace, MD;  Location: [place];  Service: Gastroenterology;  Laterality: N/A;  . GIVENS CAPSULE STUDY  05/09/2012   RMR: an unclear raised area of small bowel was noted, with features almost  characteristic of very small polyp. This was without villous  blunting or any evidence of active bleeding; yet, the area of  concern appeared to be erythematous. However, this could simply  be a light reflection on a normal variation of the small bowel. REFERRED TO DR. GILLIAM, APPT FOR NOVEMBER 2013.   Denise Parrish GIVENS CAPSULE STUDY N/A 04/30/2020   Procedure: GIVENS CAPSULE STUDY;  Surgeon: Otis Brace, MD;  Location: [place];  Service: Gastroenterology;  Laterality:  N/A;  . HIP ARTHROPLASTY Right   . PATCH ANGIOPLASTY Right 10/21/2019   Procedure: PATCH ANGIOPLASTY USING Rueben Bash BIOLOGIC PATCH;  Surgeon: Waynetta Sandy, MD;  Location: [place];  Service: Vascular;  Laterality: Right;  . POLYPECTOMY  04/28/2020   Procedure: POLYPECTOMY;  Surgeon: Otis Brace, MD;  Location: [facility];  Service: Gastroenterology;;  . TONSILLECTOMY    . TOTAL HIP ARTHROPLASTY  1995   Right    FAMILY HISTORY: Family History  Problem Relation Age of Onset  . Cirrhosis Mother        no etoh use  . CAD Father   . Diabetes Mother        + brother  . Heart failure Father   . Hypertension Father        + brother  . Cancer Brother        lung, age 41  . Uterine cancer Sister   . Colon cancer Neg Hx  SOCIAL HISTORY: Social History   Socioeconomic History  . Marital status: Divorced    Spouse name: Not on file  . Number of children: 4  . Years of education: 6th grade  . Highest education level: Not on file  Occupational History  . Occupation: Retired    Comment: [employer]  Tobacco Use  . Smoking status: Never Smoker  . Smokeless tobacco: Never Used  . Tobacco comment: Quit x 50 years  Vaping Use  . Vaping Use: Never used  Substance and Sexual Activity  . Alcohol use: Never  . Drug use: Never  . Sexual activity: Not Currently  Other Topics Concern  . Not on file  Social History Narrative   Lives alone. Every other week she has family with her. Daughter and son take care of her during the days.    Right-handed.   No daily use of caffeine.             Social Determinants of Health   Financial Resource Strain:   . Difficulty of Paying Living Expenses: Not on file  Food Insecurity:   . Worried About Charity fundraiser in the Last Year: Not on file  . Ran Out of Food in the Last Year: Not on file  Transportation Needs:   . Lack of Transportation (Medical): Not on file  . Lack of Transportation (Non-Medical): Not on file  Physical  Activity:   . Days of Exercise per Week: Not on file  . Minutes of Exercise per Session: Not on file  Stress:   . Feeling of Stress : Not on file  Social Connections:   . Frequency of Communication with Friends and Family: Not on file  . Frequency of Social Gatherings with Friends and Family: Not on file  . Attends Religious Services: Not on file  . Active Member of Clubs or Organizations: Not on file  . Attends Archivist Meetings: Not on file  . Marital Status: Not on file  Intimate Partner Violence:   . Fear of Current or Ex-Partner: Not on file  . Emotionally Abused: Not on file  . Physically Abused: Not on file  . Sexually Abused: Not on file     PHYSICAL EXAM   Vitals:   05/18/20 0819  BP: 105/60  Pulse: 87   Not recorded     There is no height or weight on file to calculate BMI.  PHYSICAL EXAMNIATION:  Gen: NAD, conversant, well nourised, well groomed                     Cardiovascular: Regular rate rhythm, no peripheral edema, warm, nontender. Eyes: Conjunctivae clear without exudates or hemorrhage Neck: Supple, no carotid bruits. Pulmonary: Clear to auscultation bilaterally   NEUROLOGICAL EXAM:  MENTAL STATUS: Speech:    Speech is normal; fluent and spontaneous with normal comprehension.  Cognition:     Orientation to time, place and person     Normal recent and remote memory     Normal Attention span and concentration     Normal Language, naming, repeating,spontaneous speech     Fund of knowledge   CRANIAL NERVES: CN II: Visual fields are full to confrontation. Pupils are round equal and briskly reactive to light. CN III, IV, VI: extraocular movement are normal. No ptosis. CN V: Facial sensation is intact to light touch CN VII: Face is symmetric with normal eye closure  CN VIII: Hearing is normal to causal conversation. CN IX, X: Phonation  is normal. CN XI: Head turning and shoulder shrug are intact  MOTOR: There is no pronator drift  of out-stretched arms. Muscle bulk and tone are normal. Muscle strength is normal.  REFLEXES: Reflexes are 1  and symmetric at the biceps, triceps, knees, and ankles. Plantar responses are flexor.  SENSORY: Intact to light touch, pinprick and vibratory sensation are intact in fingers and toes.  COORDINATION: There is no trunk or limb dysmetria noted.  GAIT/STANCE: Deferred, wearing left boot, recent distal left fibular fracture  DIAGNOSTIC DATA (LABS, IMAGING, TESTING) - I reviewed patient records, labs, notes, testing and imaging myself where available.   ASSESSMENT AND PLAN  Denise Parrish is a 86 y.o. female   Stroke S/p right endarterectomy in February 2021 GI bleeding while taking aspirin and Plavix, now on aspirin 81 mg alone  Vascular risk factor of aging, hypertension, hyperlipidemia, diabetes  Continue aspirin 81 mg daily  Emphasized importance of increased water intake  Only return to clinic for new issues   Marcial Pacas, M.D. Ph.D.  [facility] [address] [place], [place] [address] Ph: [phone] Fax: [phone]  CC:  [name], [address] [place],  [place] [id number]

## 2020-05-26 NOTE — Progress Notes (Signed)
Cardiology Office Note  Date: 05/27/2020   ID: Denise, Parrish 1923/06/07, MRN [id number]  PCP:  [name], MD  Cardiologist:  Dorris Carnes, MD Electrophysiologist:  None   Chief Complaint: Follow-up cardiac murmur, aortic stenosis  History of Present Illness: Denise Parrish is a 86 y.o. female with a history of.  Murmur, aortic stenosis, palpitations, chest pain with minimal CAD at cath 2005.  DM 2, GERD, HLD, HTN, obesity, emphysema, hiatal hernia, longstanding heart murmur, CVA (unspecified CVA in January 2021 and TIA July 2021 with right CEA February 2021 for symptomatic right ICA stenosis.  Last seen by Dr. Johnsie Cancel 06/24/2019: Referred by primary care for murmur.  Had known mild aortic stenosis by echo 2013 mean gradient of 14 mmHg, peak 25 mmHg.  She had no complaints of chest pain.  Had more dyspnea in the previous year.  1 episode of dizziness in the prior couple months.  She was referred to Dr. Burt Knack for consideration of TAVR despite her age if she had critical aortic stenosis it was believed she would be a candidate on initial depression.  He mention oxylate age 19 she was not a open surgical candidate.  Her blood pressure was well controlled at that time.  Recent presentation for rectal bleeding on April 27, 2020 to [facility] ED.  She was was brought into ED with 5 days of weakness, pallor, fatigue.  Hemoglobin was 8.8, troponins were [phone] most likely from demand ischemia.  Cardiology was following and recommended medical management.  She was restarted on metoprolol 12.5 mg p.o. twice daily.  She did not complain of chest pain but had shortness of breath she had been having loose black stools for about 5 days but had not wanted to go to PCP.  She was seen by Dr. Debara Pickett at [facility] after being transferred for GI work-up.  She presented with a hemoglobin of 7.2.  She was transfused 2 units PRBCs increasing her hemoglobin to 10.4.  Status post EGD on the 04/28/2020 showed  no evidence of active bleeding.  1 medium size gastric polyp removed with biopsy forceps.  CT of abdomen pelvis showed severe sigmoid diverticulosis and cholelithiasis but no intra-abdominal or pelvic pathology.  Tagged red blood cell study on 04/29/2020 did not show any evidence of active bleeding.  Patient is here for follow-up.  She denies any recent bleeding episodes.  She states her Plavix was stopped and her full dose aspirin was decreased baby aspirin 81 mg.  Her blood pressure is within normal limits today.  She denies any anginal or exertional symptoms, palpitations or arrhythmias, orthostatic symptoms, PND, orthopnea, CVA or TIA-like symptoms, claudication-like symptoms, DVT or PE-like symptoms, or lower extremity edema.  Past Medical History:  Diagnosis Date  . Anxiety   . Aortic sclerosis    Long-standing heart murmur  . Carotid stenosis, right 10/16/2019   75% on CTA neck  . Chest pain 2006   +palpitations; minimal CAD at cath in 2005; normal EF; negative stress echo in 09/2008  . Cholelithiasis    on u/s 09/2011  . Degenerative joint disease    Right THA in 1995; cervical discectomy and fusion-2001  . Diabetes ([place])   . Diabetes mellitus, type 2 (HCC)    + neuropathy  . Emphysema   . Gastroesophageal reflux disease   . Heart attack ([place])   . Hiatal hernia   . Hyperlipidemia   . Hypertension   . IDA (iron deficiency anemia)  labs 09/2011  . Mild cognitive impairment   . Obesity   . Osteoporosis   . Pneumonia   . Sigmoid diverticulosis   . Small bowel lesion    On Given's capsule; Dr Kathleene Hazel 07/2012, no further w/u needed  . Stroke ([place])   . TIA (transient ischemic attack)     Past Surgical History:  Procedure Laterality Date  . ANTERIOR CERVICAL DISCECTOMY  2001   C4-5, allograft, fixation  . BREAST LUMPECTOMY     benign  . CATARACT EXTRACTION W/ INTRAOCULAR LENS IMPLANT  2007   Left  . COLONOSCOPY  2006  . COLONOSCOPY  04/03/12   Dr. Ok Edwards  diverticulosis, negative microscopic  colitis   . ENDARTERECTOMY Right 10/21/2019   Procedure: ENDARTERECTOMY CAROTID RIGHT;  Surgeon: Waynetta Sandy, MD;  Location: [facility];  Service: Vascular;  Laterality: Right;  . ESOPHAGOGASTRODUODENOSCOPY  04/03/12   Dr. Jennet Maduro hernia, chronic gastritis on bx  . ESOPHAGOGASTRODUODENOSCOPY (EGD) WITH PROPOFOL N/A 04/28/2020   Procedure: ESOPHAGOGASTRODUODENOSCOPY (EGD) WITH PROPOFOL;  Surgeon: Otis Brace, MD;  Location: [facility];  Service: Gastroenterology;  Laterality: N/A;  . GIVENS CAPSULE STUDY  05/09/2012   RMR: an unclear raised area of small bowel was noted, with features almost  characteristic of very small polyp. This was without villous  blunting or any evidence of active bleeding; yet, the area of  concern appeared to be erythematous. However, this could simply  be a light reflection on a normal variation of the small bowel. REFERRED TO DR. GILLIAM, APPT FOR NOVEMBER 2013.   [name] GIVENS CAPSULE STUDY N/A 04/30/2020   Procedure: GIVENS CAPSULE STUDY;  Surgeon: Otis Brace, MD;  Location: [facility];  Service: Gastroenterology;  Laterality: N/A;  . HIP ARTHROPLASTY Right   . PATCH ANGIOPLASTY Right 10/21/2019   Procedure: PATCH ANGIOPLASTY USING Rueben Bash BIOLOGIC PATCH;  Surgeon: Waynetta Sandy, MD;  Location: [facility];  Service: Vascular;  Laterality: Right;  . POLYPECTOMY  04/28/2020   Procedure: POLYPECTOMY;  Surgeon: Otis Brace, MD;  Location: [facility];  Service: Gastroenterology;;  . TONSILLECTOMY    . TOTAL HIP ARTHROPLASTY  1995   Right    Current Outpatient Medications  Medication Sig Dispense Refill  . ALPRAZolam (XANAX) 0.5 MG tablet Take 1 tablet (0.5 mg total) by mouth 3 (three) times daily. (Patient taking differently: Take 0.5 mg by mouth at bedtime. Occasionally 1 tablet during the day if needed.) 15 tablet 0  . aspirin 81 MG chewable tablet Chew 1 tablet (81 mg total) by mouth daily.    [name]  atorvastatin (LIPITOR) 40 MG tablet Take 1 tablet (40 mg total) by mouth daily at 6 PM. 30 tablet 2  . glimepiride (AMARYL) 1 MG tablet Take 1 mg by mouth daily with breakfast.    . Glycerin-Hypromellose-PEG 400 (VISINE DRY EYE OP) Apply 1 drop to eye 3 (three) times daily as needed (for dry eye relief).     . meclizine (ANTIVERT) 12.5 MG tablet Take 1 tablet (12.5 mg total) by mouth 3 (three) times daily as needed for dizziness. 20 tablet 0  . metoprolol tartrate (LOPRESSOR) 25 MG tablet Take 0.5 tablets (12.5 mg total) by mouth 2 (two) times daily. 30 tablet 0  . multivitamin-lutein (OCUVITE-LUTEIN) CAPS capsule Take 1 capsule by mouth daily.    [name] omeprazole (PRILOSEC) 20 MG capsule Take 20 mg by mouth Daily.     No current facility-administered medications for this visit.   Allergies:  Patient has no known allergies.  Social History: The patient  reports that she has never smoked. She has never used smokeless tobacco. She reports that she does not drink alcohol and does not use drugs.   Family History: The patient's family history includes CAD in her father; Cancer in her brother; Cirrhosis in her mother; Diabetes in her mother; Heart failure in her father; Hypertension in her father; Uterine cancer in her sister.   ROS:  Please see the history of present illness. Otherwise, complete review of systems is positive for none.  All other systems are reviewed and negative.   Physical Exam: VS:  BP (!) 116/58   Pulse 92   Ht 5' 5"  (1.651 m)   Wt 165 lb (74.8 kg)   SpO2 97%   BMI 27.46 kg/m , BMI Body mass index is 27.46 kg/m.  Wt Readings from Last 3 Encounters:  05/27/20 165 lb (74.8 kg)  05/07/20 162 lb 3.2 oz (73.6 kg)  04/07/20 172 lb 9.9 oz (78.3 kg)    General: Patient appears comfortable at rest. Neck: Supple, no elevated JVP or carotid bruits, no thyromegaly. Lungs: Clear to auscultation, nonlabored breathing at rest. Cardiac: Regular rate and rhythm, no S3 or significant  systolic murmur, no pericardial rub. Extremities: No pitting edema, distal pulses 2+. Skin: Warm and dry. Musculoskeletal: No kyphosis. Neuropsychiatric: Alert and oriented x3, affect grossly appropriate.  ECG:  EKG April 30, 2020 sinus rhythm with premature atrial complexes rate of 90, ST and T wave abnormality consider inferolateral ischemia, prolonged QT  Recent Labwork: 04/27/2020: ALT 15; AST 24 05/05/2020: Magnesium 1.9; Platelets 290 05/07/2020: BUN 18; Creatinine, Ser 1.15; Hemoglobin 10.9; Potassium 3.6; Sodium 138     Component Value Date/Time   CHOL 100 04/08/2020 0441   TRIG 83 04/08/2020 0441   HDL 35 (L) 04/08/2020 0441   CHOLHDL 2.9 04/08/2020 0441   VLDL 17 04/08/2020 0441   LDLCALC 48 04/08/2020 0441    Other Studies Reviewed Today:  Echo 10/17/19 IMPRESSIONS    1. Left ventricular ejection fraction, by visual estimation, is 60 to  65%. The left ventricle has normal function. There is mildly increased  left ventricular hypertrophy.  2. Elevated left ventricular end-diastolic pressure.  3. Left ventricular diastolic parameters are consistent with Grade I  diastolic dysfunction (impaired relaxation).  4. The left ventricle has no regional wall motion abnormalities.  5. Global right ventricle has normal systolic function.The right  ventricular size is normal. No increase in right ventricular wall  thickness.  6. Left atrial size was normal.  7. Right atrial size was normal.  8. Moderate mitral annular calcification.  9. The mitral valve is degenerative. Trivial mitral valve regurgitation.  Mild mitral stenosis.  10. The tricuspid valve is grossly normal.  11. The tricuspid valve is grossly normal. Tricuspid valve regurgitation  is mild.  12. The aortic valve is tricuspid. Aortic valve regurgitation is not  visualized. Moderate to severe aortic valve stenosis.  13. The pulmonic valve was grossly normal. Pulmonic valve regurgitation is  not  visualized.  14. Moderately elevated pulmonary artery systolic pressure.  15. The inferior vena cava is normal in size with greater than 50%  respiratory variability, suggesting right atrial pressure of 3 mmHg.   FINDINGS  Left Ventricle: Left ventricular ejection fraction, by visual estimation,  is 60 to 65%. The left ventricle has normal function. The left ventricle  has no regional wall motion abnormalities. There is mildly increased left  ventricular hypertrophy.  Concentric left ventricular hypertrophy. Left  ventricular diastolic  parameters are consistent with Grade I diastolic dysfunction (impaired  relaxation). Elevated left ventricular end-diastolic pressure.   Right Ventricle: The right ventricular size is normal. No increase in  right ventricular wall thickness. Global RV systolic function is has  normal systolic function. The tricuspid regurgitant velocity is 2.86 m/s,  and with an assumed right atrial pressure  of 10 mmHg, the estimated right ventricular systolic pressure is  moderately elevated at 42.7 mmHg.   Left Atrium: Left atrial size was normal in size.   Right Atrium: Right atrial size was normal in size   Pericardium: There is no evidence of pericardial effusion.   Mitral Valve: The mitral valve is degenerative in appearance. There is  mild thickening of the mitral valve leaflet(s). There is mild  calcification of the mitral valve leaflet(s). Moderate mitral annular  calcification. Trivial mitral valve regurgitation.  Mild mitral valve stenosis by observation. MV peak gradient, 11.0 mmHg.   Tricuspid Valve: The tricuspid valve is grossly normal. Tricuspid valve  regurgitation is mild.   Aortic Valve: The aortic valve is tricuspid. . There is mild thickening  and mild calcification of the aortic valve. Aortic valve regurgitation is  not visualized. Moderate to severe aortic stenosis is present. Moderate  aortic valve annular calcification.  There is mild  thickening of the aortic valve. There is mild calcification  of the aortic valve. Aortic valve mean gradient measures 34.3 mmHg. Aortic  valve peak gradient measures 57.1 mmHg.   Pulmonic Valve: The pulmonic valve was grossly normal. Pulmonic valve  regurgitation is not visualized. Pulmonic regurgitation is not visualized.   Aorta: The aortic root is normal in size and structure.   Venous: The inferior vena cava is normal in size with greater than 50%  respiratory variability, suggesting right atrial pressure of 3 mmHg.   IAS/Shunts: No atrial level shunt detected by color flow Doppler.    LEFT VENTRICLE  PLAX 2D  LVIDd:     3.77 cm Diastology  LVIDs:     2.97 cm LV e' lateral:  5.03 cm/s  LV PW:     1.32 cm LV E/e' lateral: 25.6  LV IVS:    1.04 cm LV e' medial:  4.30 cm/s  LV SV:     27 ml  LV E/e' medial: 30.0  LV SV Index:  14.31    RIGHT VENTRICLE  RV S prime:   10.90 cm/s  TAPSE (M-mode): 2.2 cm   LEFT ATRIUM       Index    RIGHT ATRIUM      Index  LA diam:    3.70 cm 2.04 cm/m RA Area:   14.30 cm  LA Vol (A2C):  68.1 ml 37.50 ml/m RA Volume:  36.50 ml 20.10 ml/m  LA Vol (A4C):  30.5 ml 16.79 ml/m  LA Biplane Vol: 50.6 ml 27.86 ml/m  AORTIC VALVE  AV Vmax:      377.67 cm/s  AV Vmean:     274.000 cm/s  AV VTI:      0.930 m  AV Peak Grad:   57.1 mmHg  AV Mean Grad:   34.3 mmHg  LVOT Vmax:     139.67 cm/s  LVOT Vmean:    107.267 cm/s  LVOT VTI:     0.342 m  LVOT/AV VTI ratio: 0.37   AORTA  Ao Root diam: 2.30 cm   MITRAL VALVE             TRICUSPID VALVE  MV Area (PHT): 1.76 cm       TR Peak grad:  32.7 mmHg  MV Peak grad: 11.0 mmHg       TR Vmax:    286.00 cm/s  MV Mean grad: 4.0 mmHg  MV Vmax:    1.66 m/s       SHUNTS  MV Vmean:   97.4 cm/s       Systemic VTI: 0.34 m  MV VTI:    0.53 m  MV PHT:     124.70 msec  MV Decel Time: 430 msec  MV E velocity: 129.00 cm/s 103 cm/s  MV A velocity: 160.00 cm/s 70.3 cm/s  MV E/A ratio: 0.81    1.5     Assessment and Plan:  1. Aortic valve stenosis, etiology of cardiac valve disease unspecified   2. Essential hypertension    1. Aortic valve stenosis, etiology of cardiac valve disease unspecified Last echocardiogram January 2021 moderate to severe aortic valve stenosis.  Patient denies any dyspnea, orthostatic symptoms such as dizziness, lightheadedness, or presyncopal episodes.  Denies any anginal symptoms.  2. Essential hypertension Blood pressures well controlled on current therapy.  Continue metoprolol 12.5 mg p.o. twice daily.  3.  History of CVA Patient recently had a GI bleed secondary to taking full dose aspirin and Plavix combination.  Plavix was discontinued and full dose aspirin was decreased to 81 mg daily.  Continue daily 81 mg aspirin.  No recent CVA or TIA-like symptoms.  Continue atorvastatin 40 mg daily.  She is status post CEA  Medication Adjustments/Labs and Tests Ordered: Current medicines are reviewed at length with the patient today.  Concerns regarding medicines are outlined above.   Disposition: Follow-up with Dr. Harrington Challenger or APP 6 months  Signed, Levell July, NP 05/27/2020 9:24 AM    [facility], [place], [place] [address] Phone: [phone]; Fax: [phone]

## 2020-05-27 ENCOUNTER — Ambulatory Visit (INDEPENDENT_AMBULATORY_CARE_PROVIDER_SITE_OTHER): Payer: Medicare Other | Admitting: Family Medicine

## 2020-05-27 ENCOUNTER — Encounter: Payer: Self-pay | Admitting: Family Medicine

## 2020-05-27 ENCOUNTER — Other Ambulatory Visit: Payer: Self-pay

## 2020-05-27 VITALS — BP 116/58 | HR 92 | Ht 65.0 in | Wt 165.0 lb

## 2020-05-27 DIAGNOSIS — I350 Nonrheumatic aortic (valve) stenosis: Secondary | ICD-10-CM

## 2020-05-27 DIAGNOSIS — I10 Essential (primary) hypertension: Secondary | ICD-10-CM | POA: Diagnosis not present

## 2020-05-27 NOTE — Patient Instructions (Addendum)

## 2020-06-25 ENCOUNTER — Ambulatory Visit (HOSPITAL_COMMUNITY)
Admission: RE | Admit: 2020-06-25 | Discharge: 2020-06-25 | Disposition: A | Discharge status: Home / Self Care | Payer: Medicare Other | Source: Ambulatory Visit | Attending: Cardiovascular Disease | Admitting: Cardiovascular Disease

## 2020-06-25 ENCOUNTER — Other Ambulatory Visit: Payer: Self-pay

## 2020-06-25 DIAGNOSIS — I350 Nonrheumatic aortic (valve) stenosis: Secondary | ICD-10-CM

## 2020-06-25 LAB — ECHOCARDIOGRAM COMPLETE
AR max vel: 0.65 cm2
AV Area VTI: 0.6 cm2
AV Area mean vel: 0.62 cm2
AV Mean grad: 32 mmHg
AV Peak grad: 51 mmHg
Ao pk vel: 3.57 m/s
Area-P 1/2: 2.65 cm2
S' Lateral: 2.66 cm

## 2020-06-25 NOTE — Progress Notes (Signed)
*  PRELIMINARY RESULTS* Echocardiogram 2D Echocardiogram has been performed.  Denise Parrish 06/25/2020, 3:02 PM

## 2020-07-02 ENCOUNTER — Telehealth: Payer: Self-pay | Admitting: Cardiovascular Disease

## 2020-07-02 NOTE — Telephone Encounter (Signed)
Fay Records, MD  06/29/2020 9:12 AM EDT     Pumping function of heart is normal  AV remains stable Mod/severe Aortic stenosis  Pumping function of the heart is normal No new recommendations    I spoke with daughter and gave her echo results. I will copy pcp.

## 2020-07-02 NOTE — Telephone Encounter (Signed)
New message      Patient is waiting on the results for the echo

## 2020-08-05 ENCOUNTER — Emergency Department (HOSPITAL_COMMUNITY): Payer: Medicare Other

## 2020-08-05 ENCOUNTER — Other Ambulatory Visit: Payer: Self-pay

## 2020-08-05 ENCOUNTER — Emergency Department (HOSPITAL_COMMUNITY)
Admission: EM | Admit: 2020-08-05 | Discharge: 2020-08-05 | Disposition: A | Discharge status: Home / Self Care | Payer: Medicare Other | Attending: Emergency Medicine | Admitting: Emergency Medicine

## 2020-08-05 ENCOUNTER — Encounter (HOSPITAL_COMMUNITY): Payer: Self-pay

## 2020-08-05 DIAGNOSIS — I10 Essential (primary) hypertension: Secondary | ICD-10-CM | POA: Insufficient documentation

## 2020-08-05 DIAGNOSIS — Z7982 Long term (current) use of aspirin: Secondary | ICD-10-CM | POA: Diagnosis not present

## 2020-08-05 DIAGNOSIS — R079 Chest pain, unspecified: Secondary | ICD-10-CM | POA: Diagnosis not present

## 2020-08-05 DIAGNOSIS — E1140 Type 2 diabetes mellitus with diabetic neuropathy, unspecified: Secondary | ICD-10-CM | POA: Diagnosis not present

## 2020-08-05 DIAGNOSIS — Z20822 Contact with and (suspected) exposure to covid-19: Secondary | ICD-10-CM | POA: Insufficient documentation

## 2020-08-05 DIAGNOSIS — Z79899 Other long term (current) drug therapy: Secondary | ICD-10-CM | POA: Diagnosis not present

## 2020-08-05 DIAGNOSIS — Z96641 Presence of right artificial hip joint: Secondary | ICD-10-CM | POA: Diagnosis not present

## 2020-08-05 LAB — CBC WITH DIFFERENTIAL/PLATELET
Abs Immature Granulocytes: 0.02 10*3/uL (ref 0.00–0.07)
Basophils Absolute: 0 10*3/uL (ref 0.0–0.1)
Basophils Relative: 0 %
Eosinophils Absolute: 0.1 10*3/uL (ref 0.0–0.5)
Eosinophils Relative: 2 %
HCT: 37.8 % (ref 36.0–46.0)
Hemoglobin: 11.9 g/dL — ABNORMAL LOW (ref 12.0–15.0)
Immature Granulocytes: 0 %
Lymphocytes Relative: 33 %
Lymphs Abs: 2.2 10*3/uL (ref 0.7–4.0)
MCH: 28.7 pg (ref 26.0–34.0)
MCHC: 31.5 g/dL (ref 30.0–36.0)
MCV: 91.1 fL (ref 80.0–100.0)
Monocytes Absolute: 0.8 10*3/uL (ref 0.1–1.0)
Monocytes Relative: 12 %
Neutro Abs: 3.6 10*3/uL (ref 1.7–7.7)
Neutrophils Relative %: 53 %
Platelets: 214 10*3/uL (ref 150–400)
RBC: 4.15 MIL/uL (ref 3.87–5.11)
RDW: 15.5 % (ref 11.5–15.5)
WBC: 6.7 10*3/uL (ref 4.0–10.5)
nRBC: 0 % (ref 0.0–0.2)

## 2020-08-05 LAB — BASIC METABOLIC PANEL
Anion gap: 7 (ref 5–15)
BUN: 18 mg/dL (ref 8–23)
CO2: 27 mmol/L (ref 22–32)
Calcium: 8.8 mg/dL — ABNORMAL LOW (ref 8.9–10.3)
Chloride: 104 mmol/L (ref 98–111)
Creatinine, Ser: 0.88 mg/dL (ref 0.44–1.00)
GFR, Estimated: >60 mL/min (ref >60)
Glucose, Bld: 113 mg/dL — ABNORMAL HIGH (ref 70–99)
Potassium: 4.1 mmol/L (ref 3.5–5.1)
Sodium: 138 mmol/L (ref 135–145)

## 2020-08-05 LAB — RESP PANEL BY RT-PCR (FLU A&B, COVID) ARPGX2
Influenza A by PCR: NEGATIVE
Influenza B by PCR: NEGATIVE
SARS Coronavirus 2 by RT PCR: NEGATIVE

## 2020-08-05 LAB — TROPONIN I (HIGH SENSITIVITY)
Troponin I (High Sensitivity): 9 ng/L (ref <18)
Troponin I (High Sensitivity): 9 ng/L (ref <18)

## 2020-08-05 NOTE — ED Provider Notes (Signed)
[facility] EMERGENCY DEPARTMENT Provider Note   CSN: [id number] Arrival date & time: 08/05/20  1348     History Chief Complaint  Patient presents with  . Chest Pain    Denise Parrish is a 86 y.o. female.  HPI     This is a 86 year old female with a history of coronary artery disease, diabetes, hypertension, hyperlipidemia who presents with chest pain.  Patient reports that she had onset of heavy-like chest pain while at home prior to arrival.  She states the pain lasted approximately 1 hour.  She states that it was left-sided and heavy.  It waxed and waned but was not necessarily exertional.  It did not radiate.  She states that she had a heart attack previously but did not know she was having a heart attack and has never had chest pain before.  She did not have any associated shortness of breath or sweating.  She is currently pain-free.  She received 324 mg of aspirin by EMS.  Past Medical History:  Diagnosis Date  . Anxiety   . Aortic sclerosis    Long-standing heart murmur  . Carotid stenosis, right 10/16/2019   75% on CTA neck  . Chest pain 2006   +palpitations; minimal CAD at cath in 2005; normal EF; negative stress echo in 09/2008  . Cholelithiasis    on u/s 09/2011  . Degenerative joint disease    Right THA in 1995; cervical discectomy and fusion-2001  . Diabetes ([place])   . Diabetes mellitus, type 2 (HCC)    + neuropathy  . Emphysema   . Gastroesophageal reflux disease   . Heart attack ([place])   . Hiatal hernia   . Hyperlipidemia   . Hypertension   . IDA (iron deficiency anemia)    labs 09/2011  . Mild cognitive impairment   . Obesity   . Osteoporosis   . Pneumonia   . Sigmoid diverticulosis   . Small bowel lesion    On Given's capsule; Dr Kathleene Hazel 07/2012, no further w/u needed  . Stroke ([place])   . TIA (transient ischemic attack)     Patient Active Problem List   Diagnosis Date Noted  . Closed left ankle fracture, initial encounter 05/04/2020    . Demand ischemia ([place]) 04/28/2020  . Goals of care, counseling/discussion   . Palliative care by specialist   . Acute GI bleeding 04/26/2020  . Focal neurological deficit 04/07/2020  . CVA (cerebral vascular accident) ([place]) 10/17/2019  . TIA (transient ischemic attack) 10/16/2019  . Slurred speech 10/16/2019  . Facial droop 10/16/2019  . Essential hypertension 10/16/2019  . Diabetes ([place]) 10/16/2019  . Stroke ([place]) 10/16/2019  . RUQ pain   . Dehydration   . Ileus ([place]) 12/12/2017  . Enteritis   . Right upper lobe pneumonia 12/08/2017  . Sepsis ([place]) 12/08/2017  . Lactic acidosis 12/08/2017  . Anxiety 12/08/2017  . Dehydration fever 12/08/2017  . Segmental ileitis of small intestine ([place]) 06/16/2015  . Diverticulosis of colon without hemorrhage 05/25/2015  . SBO (small bowel obstruction) ([place]) 05/23/2015  . Nausea and vomiting in adult   . Nausea vomiting and diarrhea 09/03/2013  . Vomiting 09/03/2013  . Transaminitis 09/03/2013  . Left leg weakness 10/14/2012  . Difficulty in walking(719.7) 10/14/2012  . Small bowel lesion 07/09/2012  . Heme positive stool 03/14/2012  . GERD (gastroesophageal reflux disease) 03/14/2012  . Diarrhea 03/14/2012  . Right carotid bruit 11/01/2011  . Anemia, iron deficiency 10/28/2011  . Palpitations 10/16/2011  .  Diabetes mellitus, type 2 ([place])   . Hypertension   . Hyperlipidemia   . Obesity   . Aortic sclerosis     Past Surgical History:  Procedure Laterality Date  . ANTERIOR CERVICAL DISCECTOMY  2001   C4-5, allograft, fixation  . BREAST LUMPECTOMY     benign  . CATARACT EXTRACTION W/ INTRAOCULAR LENS IMPLANT  2007   Left  . COLONOSCOPY  2006  . COLONOSCOPY  04/03/12   Dr. Ok Edwards diverticulosis, negative microscopic  colitis   . ENDARTERECTOMY Right 10/21/2019   Procedure: ENDARTERECTOMY CAROTID RIGHT;  Surgeon: Waynetta Sandy, MD;  Location: [facility];  Service: Vascular;  Laterality: Right;  .  ESOPHAGOGASTRODUODENOSCOPY  04/03/12   Dr. Jennet Maduro hernia, chronic gastritis on bx  . ESOPHAGOGASTRODUODENOSCOPY (EGD) WITH PROPOFOL N/A 04/28/2020   Procedure: ESOPHAGOGASTRODUODENOSCOPY (EGD) WITH PROPOFOL;  Surgeon: Otis Brace, MD;  Location: [facility];  Service: Gastroenterology;  Laterality: N/A;  . GIVENS CAPSULE STUDY  05/09/2012   RMR: an unclear raised area of small bowel was noted, with features almost  characteristic of very small polyp. This was without villous  blunting or any evidence of active bleeding; yet, the area of  concern appeared to be erythematous. However, this could simply  be a light reflection on a normal variation of the small bowel. REFERRED TO DR. GILLIAM, APPT FOR NOVEMBER 2013.   [name] GIVENS CAPSULE STUDY N/A 04/30/2020   Procedure: GIVENS CAPSULE STUDY;  Surgeon: Otis Brace, MD;  Location: [facility];  Service: Gastroenterology;  Laterality: N/A;  . HIP ARTHROPLASTY Right   . PATCH ANGIOPLASTY Right 10/21/2019   Procedure: PATCH ANGIOPLASTY USING Rueben Bash BIOLOGIC PATCH;  Surgeon: Waynetta Sandy, MD;  Location: [facility];  Service: Vascular;  Laterality: Right;  . POLYPECTOMY  04/28/2020   Procedure: POLYPECTOMY;  Surgeon: Otis Brace, MD;  Location: [facility];  Service: Gastroenterology;;  . TONSILLECTOMY    . TOTAL HIP ARTHROPLASTY  1995   Right     OB History   No obstetric history on file.     Family History  Problem Relation Age of Onset  . Cirrhosis Mother        no etoh use  . CAD Father   . Diabetes Mother        + brother  . Heart failure Father   . Hypertension Father        + brother  . Cancer Brother        lung, age 38  . Uterine cancer Sister   . Colon cancer Neg Hx     Social History   Tobacco Use  . Smoking status: Never Smoker  . Smokeless tobacco: Never Used  . Tobacco comment: Quit x 50 years  Vaping Use  . Vaping Use: Never used  Substance Use Topics  . Alcohol use: Never  . Drug use:  Never    Home Medications Prior to Admission medications   Medication Sig Start Date End Date Taking? Authorizing Provider  ALPRAZolam Duanne Moron) 0.5 MG tablet Take 1 tablet (0.5 mg total) by mouth 3 (three) times daily. Patient taking differently: Take 0.5 mg by mouth at bedtime. Occasionally 1 tablet during the day if needed. 12/11/17  Yes Barton Dubois, MD  Ascorbic Acid (VITAMIN C PO) Take 1 tablet by mouth daily.   Yes [provider]  aspirin 81 MG chewable tablet Chew 1 tablet (81 mg total) by mouth daily. 05/08/20  Yes Mikhail, Maryann, DO  atorvastatin (LIPITOR) 40 MG tablet Take  1 tablet (40 mg total) by mouth daily at 6 PM. 10/22/19  Yes Georgette Shell, MD  cholecalciferol (VITAMIN D) 25 MCG (1000 UNIT) tablet Take 1,000 Units by mouth daily.   Yes [provider]  glimepiride (AMARYL) 1 MG tablet Take 1 mg by mouth daily with breakfast.   Yes [provider]  Glycerin-Hypromellose-PEG 400 (VISINE DRY EYE OP) Apply 1 drop to eye 3 (three) times daily as needed (for dry eye relief).    Yes [provider]  meclizine (ANTIVERT) 12.5 MG tablet Take 1 tablet (12.5 mg total) by mouth 3 (three) times daily as needed for dizziness. 12/14/19  Yes Virgel Manifold, MD  metoprolol tartrate (LOPRESSOR) 25 MG tablet Take 0.5 tablets (12.5 mg total) by mouth 2 (two) times daily. 05/07/20  Yes Mikhail, [place], DO  Multiple Vitamins-Minerals (PRESERVISION AREDS 2 PO) Take 1 tablet by mouth See admin instructions. Takes 1 tablet in the morning and 1 tablet at night for eyes.   Yes [provider]  multivitamin-lutein (OCUVITE-LUTEIN) CAPS capsule Take 1 capsule by mouth daily.   Yes [provider]  omeprazole (PRILOSEC) 20 MG capsule Take 20 mg by mouth Daily. 08/05/12  Yes [provider]  vitamin E 180 MG (400 UNITS) capsule Take 400 Units by mouth daily.   Yes [provider]    Allergies    Patient has no known  allergies.  Review of Systems   Review of Systems  Constitutional: Negative for diaphoresis and fever.  Respiratory: Negative for shortness of breath.   Cardiovascular: Positive for chest pain. Negative for leg swelling.  Gastrointestinal: Negative for abdominal pain, nausea and vomiting.  Genitourinary: Negative for dysuria.  All other systems reviewed and are negative.   Physical Exam Updated Vital Signs BP (!) 148/77 (BP Location: Right Arm)   Pulse 74   Temp 97.7 F (36.5 C) (Oral)   Resp 15   Ht 1.651 m (5' 5" )   Wt 73.9 kg   SpO2 99%   BMI 27.12 kg/m   Physical Exam Vitals and nursing note reviewed.  Constitutional:      Appearance: She is well-developed.     Comments: Elderly, nontoxic-appearing  HENT:     Head: Normocephalic and atraumatic.  Eyes:     Pupils: Pupils are equal, round, and reactive to light.  Cardiovascular:     Rate and Rhythm: Normal rate. Rhythm irregular.     Heart sounds: Normal heart sounds.  Pulmonary:     Effort: Pulmonary effort is normal. No respiratory distress.     Breath sounds: No wheezing.  Abdominal:     General: Bowel sounds are normal.     Palpations: Abdomen is soft.  Musculoskeletal:     Cervical back: Neck supple.     Right lower leg: No edema.     Left lower leg: No edema.  Skin:    General: Skin is warm and dry.  Neurological:     Mental Status: She is alert and oriented to person, place, and time.  Psychiatric:        Mood and Affect: Mood normal.     ED Results / Procedures / Treatments   Labs (all labs ordered are listed, but only abnormal results are displayed) Labs Reviewed  CBC WITH DIFFERENTIAL/PLATELET - Abnormal; Notable for the following components:      Result Value   Hemoglobin 11.9 (*)    All other components within normal limits  BASIC METABOLIC PANEL - Abnormal;  Notable for the following components:   Glucose, Bld 113 (*)    Calcium 8.8 (*)    All other components within normal limits   RESP PANEL BY RT-PCR (FLU A&B, COVID) ARPGX2  TROPONIN I (HIGH SENSITIVITY)  TROPONIN I (HIGH SENSITIVITY)    EKG EKG Interpretation  Date/Time:  Thursday August 05 2020 14:10:23 EST Ventricular Rate:  71 PR Interval:    QRS Duration: 104 QT Interval:  408 QTC Calculation: 444 R Axis:   42 Text Interpretation: Sinus arrhythmia Prolonged PR interval Nonspecific repol abnormality, lateral leads Confirmed by Thayer Jew [phone]) on 08/05/2020 3:04:54 PM   Radiology DG Chest 2 View  Result Date: 08/05/2020 CLINICAL DATA:  86 year old female with chest pain EXAM: CHEST - 2 VIEW COMPARISON:  Chest radiograph dated 05/03/2020. FINDINGS: No focal consolidation, pleural effusion, pneumothorax. The cardiac silhouette is within limits. Atherosclerotic calcification of the aortic arch. No acute osseous pathology. Degenerative changes the spine. IMPRESSION: No active cardiopulmonary disease. Electronically Signed   By: Anner Crete M.D.   On: 08/05/2020 17:24    Procedures Procedures (including critical care time)  Medications Ordered in ED Medications - No data to display  ED Course  I have reviewed the triage vital signs and the nursing notes.  Pertinent labs & imaging results that were available during my care of the patient were reviewed by me and considered in my medical decision making (see chart for details).    MDM Rules/Calculators/A&P HEAR Score: 6                        Patient presents with chest pain.  She is overall nontoxic and vital signs are reassuring.  She has a history of aortic stenosis and essential hypertension.  Reports a history of coronary artery disease but I cannot find evidence of this in her chart.  Last echocardiogram was reassuring and stable.  She is nontoxic and currently symptom-free.  Considerations include but not limited to, cardiac etiology, pulmonary pathology.  Chest x-ray without evidence of pneumothorax pneumonia.  Covid testing is  negative.  EKG shows no acute ischemic changes.  Troponin x2 is negative.  Heart score is 6 but with reassuring troponin profile.  She has close cardiology follow-up.  Given her age, feel risk of admission outweigh benefits and she can follow-up closely as an outpatient as she has not had any ongoing symptoms while in the emergency department.  She is agreeable to this plan and wishes to go home.  Recommend that she follow-up with Dr. Johnsie Cancel as an outpatient.  After history, exam, and medical workup I feel the patient has been appropriately medically screened and is safe for discharge home. Pertinent diagnoses were discussed with the patient. Patient was given return precautions.  Final Clinical Impression(s) / ED Diagnoses Final diagnoses:  Nonspecific chest pain    Rx / DC Orders ED Discharge Orders    None       Merryl Hacker, MD 08/05/20 2124

## 2020-08-05 NOTE — Discharge Instructions (Addendum)
You were seen today for chest pain.  Your work-up is largely reassuring.  Follow-up closely with your cardiologist.

## 2020-08-05 NOTE — ED Notes (Signed)
Received care of patient resting in bed.  0 s/s acute distress.  Call bell in reach.

## 2020-08-05 NOTE — ED Notes (Signed)
Patient to be discharged home.  Spoke to patient's daughter Denise Parrish who states someone will be on the way to pick up the patient.

## 2020-08-05 NOTE — ED Notes (Signed)
Pt from home, called out for SOB. Per EMS pt was wearing oxygen [place] with a very long extension. When EMS got there, oxygen was 33%.

## 2020-08-05 NOTE — ED Triage Notes (Signed)
Left sided chest pain that is dull. Pain has subsided since arriving to ED. 324 mg Diona Fanti given by EMS. 22G right hand.

## 2020-08-08 NOTE — Progress Notes (Signed)
Cardiology Office Note  Date: 08/09/2020   ID: Denise Parrish, Denise Parrish Mar 15, 1923, MRN [id number]  PCP:  [name], MD  Cardiologist:  Dorris Carnes, MD Electrophysiologist:  None   Chief Complaint: Follow-up cardiac murmur, aortic stenosis  History of Present Illness: Denise Parrish is a 86 y.o. female with a history of.  Murmur, aortic stenosis, palpitations, chest pain with minimal CAD at cath 2005.  DM 2, GERD, HLD, HTN, obesity, emphysema, hiatal hernia, longstanding heart murmur, CVA (unspecified CVA in January 2021 and TIA July 2021 with right CEA February 2021 for symptomatic right ICA stenosis.  Last seen by Dr. Johnsie Cancel 06/24/2019: Referred by primary care for murmur.  Had known mild aortic stenosis by echo 2013 mean gradient of 14 mmHg, peak 25 mmHg.  She had no complaints of chest pain.  Had more dyspnea in the previous year.  1 episode of dizziness in the prior couple months.  She was referred to Dr. Burt Knack for consideration of TAVR despite her age if she had critical aortic stenosis it was believed she would be a candidate on initial impression.  He mention oxylate age 17 she was not a open surgical candidate.  Her blood pressure was well controlled at that time.  Recent presentation for rectal bleeding on April 27, 2020 to [facility] ED.  She was was brought into ED with 5 days of weakness, pallor, fatigue.  Hemoglobin was 8.8, troponins were [phone] most likely from demand ischemia.  Cardiology was following and recommended medical management.  She was restarted on metoprolol 12.5 mg p.o. twice daily.  She did not complain of chest pain but had shortness of breath she had been having loose black stools for about 5 days but had not wanted to go to PCP.  She was seen by Dr. Debara Pickett at [facility] after being transferred for GI work-up.  She presented with a hemoglobin of 7.2.  She was transfused 2 units PRBCs increasing her hemoglobin to 10.4.  Status post EGD on the 04/28/2020 showed  no evidence of active bleeding.  1 medium size gastric polyp removed with biopsy forceps.  CT of abdomen pelvis showed severe sigmoid diverticulosis and cholelithiasis but no intra-abdominal or pelvic pathology.  Tagged red blood cell study on 04/29/2020 did not show any evidence of active bleeding.  Patient was here 05/27/2020 for follow-up.  She denied any recent bleeding episodes.  Her Plavix was stopped and her full dose aspirin was decreased to baby aspirin 81 mg.  Her blood pressure was within normal limits.  She denied any anginal or exertional symptoms, palpitations or arrhythmias, orthostatic symptoms, PND, orthopnea, CVA or TIA-like symptoms, claudication-like symptoms, DVT or PE-like symptoms, or lower extremity edema.  Recent presentation to [facility] emergency department 08/05/2020 for complaints of chest pain.  She noted onset of heavy-like chest pain at home prior to her arrival.  Stated the pain lasted approximately an hour.  Described as left-sided and heavy.  Waxed and waned but not exertional.  No radiation.  She received 324 mg of aspirin by EMS prior to arrival.  She stated the pain resolved prior to arrival to the emergency room after taking the baby aspirin given by EMS.  Chest x-ray was negative.  Covid test was negative.  EKG without acute hemic changes.  Troponins x2 were negative.  States she does continue to have some occasional twinges of chest pain located over the left precordial area.  States that sometimes radiates up and down to  the left mid clavicular area and down to the lower anterior rib cage without associated nausea, vomiting, diaphoresis.  States she occasionally notices an uneasy feeling in her chest described as a fluttering sensation.  States she is noticing more activity intolerance and has to stop and rest frequently due to shortness of breath and the uneasy feeling in her chest.   Past Medical History:  Diagnosis Date  . Anxiety   . Aortic sclerosis     Long-standing heart murmur  . Carotid stenosis, right 10/16/2019   75% on CTA neck  . Chest pain 2006   +palpitations; minimal CAD at cath in 2005; normal EF; negative stress echo in 09/2008  . Cholelithiasis    on u/s 09/2011  . Degenerative joint disease    Right THA in 1995; cervical discectomy and fusion-2001  . Diabetes ([place])   . Diabetes mellitus, type 2 (HCC)    + neuropathy  . Emphysema   . Gastroesophageal reflux disease   . Heart attack ([place])   . Hiatal hernia   . Hyperlipidemia   . Hypertension   . IDA (iron deficiency anemia)    labs 09/2011  . Mild cognitive impairment   . Obesity   . Osteoporosis   . Pneumonia   . Sigmoid diverticulosis   . Small bowel lesion    On Given's capsule; Dr Kathleene Hazel 07/2012, no further w/u needed  . Stroke ([place])   . TIA (transient ischemic attack)     Past Surgical History:  Procedure Laterality Date  . ANTERIOR CERVICAL DISCECTOMY  2001   C4-5, allograft, fixation  . BREAST LUMPECTOMY     benign  . CATARACT EXTRACTION W/ INTRAOCULAR LENS IMPLANT  2007   Left  . COLONOSCOPY  2006  . COLONOSCOPY  04/03/12   Dr. Ok Edwards diverticulosis, negative microscopic  colitis   . ENDARTERECTOMY Right 10/21/2019   Procedure: ENDARTERECTOMY CAROTID RIGHT;  Surgeon: Waynetta Sandy, MD;  Location: [place];  Service: Vascular;  Laterality: Right;  . ESOPHAGOGASTRODUODENOSCOPY  04/03/12   Dr. Jennet Maduro hernia, chronic gastritis on bx  . ESOPHAGOGASTRODUODENOSCOPY (EGD) WITH PROPOFOL N/A 04/28/2020   Procedure: ESOPHAGOGASTRODUODENOSCOPY (EGD) WITH PROPOFOL;  Surgeon: Otis Brace, MD;  Location: [place];  Service: Gastroenterology;  Laterality: N/A;  . GIVENS CAPSULE STUDY  05/09/2012   RMR: an unclear raised area of small bowel was noted, with features almost  characteristic of very small polyp. This was without villous  blunting or any evidence of active bleeding; yet, the area of  concern appeared to be erythematous.  However, this could simply  be a light reflection on a normal variation of the small bowel. REFERRED TO DR. GILLIAM, APPT FOR NOVEMBER 2013.   [name] GIVENS CAPSULE STUDY N/A 04/30/2020   Procedure: GIVENS CAPSULE STUDY;  Surgeon: Otis Brace, MD;  Location: [place];  Service: Gastroenterology;  Laterality: N/A;  . HIP ARTHROPLASTY Right   . PATCH ANGIOPLASTY Right 10/21/2019   Procedure: PATCH ANGIOPLASTY USING Rueben Bash BIOLOGIC PATCH;  Surgeon: Waynetta Sandy, MD;  Location: [place];  Service: Vascular;  Laterality: Right;  . POLYPECTOMY  04/28/2020   Procedure: POLYPECTOMY;  Surgeon: Otis Brace, MD;  Location: [facility];  Service: Gastroenterology;;  . TONSILLECTOMY    . TOTAL HIP ARTHROPLASTY  1995   Right    Current Outpatient Medications  Medication Sig Dispense Refill  . ALPRAZolam (XANAX) 0.5 MG tablet Take 0.5 mg by mouth at bedtime as needed for anxiety.    . Ascorbic  Acid (VITAMIN C PO) Take 1 tablet by mouth daily.    [name] aspirin 81 MG chewable tablet Chew 1 tablet (81 mg total) by mouth daily.    [name] atorvastatin (LIPITOR) 40 MG tablet Take 1 tablet (40 mg total) by mouth daily at 6 PM. 30 tablet 2  . cholecalciferol (VITAMIN D) 25 MCG (1000 UNIT) tablet Take 1,000 Units by mouth daily.    [name] glimepiride (AMARYL) 1 MG tablet Take 1 mg by mouth daily with breakfast.    . Glycerin-Hypromellose-PEG 400 (VISINE DRY EYE OP) Apply 1 drop to eye 3 (three) times daily as needed (for dry eye relief).     . meclizine (ANTIVERT) 12.5 MG tablet Take 1 tablet (12.5 mg total) by mouth 3 (three) times daily as needed for dizziness. 20 tablet 0  . metoprolol tartrate (LOPRESSOR) 25 MG tablet Take 0.5 tablets (12.5 mg total) by mouth 2 (two) times daily. 30 tablet 0  . Multiple Vitamins-Minerals (PRESERVISION AREDS 2 PO) Take 1 tablet by mouth See admin instructions. Takes 1 tablet in the morning and 1 tablet at night for eyes.    [name] omeprazole (PRILOSEC) 20 MG capsule Take 20 mg  by mouth Daily.    . vitamin E 180 MG (400 UNITS) capsule Take 400 Units by mouth daily.     No current facility-administered medications for this visit.   Allergies:  Patient has no known allergies.   Social History: The patient  reports that she has never smoked. She has never used smokeless tobacco. She reports that she does not drink alcohol and does not use drugs.   Family History: The patient's family history includes CAD in her father; Cancer in her brother; Cirrhosis in her mother; Diabetes in her mother; Heart failure in her father; Hypertension in her father; Uterine cancer in her sister.   ROS:  Please see the history of present illness. Otherwise, complete review of systems is positive for none.  All other systems are reviewed and negative.   Physical Exam: VS:  BP (!) 148/82   Pulse 87   Ht 5' 5"  (1.651 m)   Wt 169 lb 6.4 oz (76.8 kg)   SpO2 96%   BMI 28.19 kg/m , BMI Body mass index is 28.19 kg/m.  Wt Readings from Last 3 Encounters:  08/09/20 169 lb 6.4 oz (76.8 kg)  08/05/20 163 lb (73.9 kg)  05/27/20 165 lb (74.8 kg)    General: Patient appears comfortable at rest. Neck: Supple, no elevated JVP or carotid bruits, no thyromegaly. Lungs: Clear to auscultation, nonlabored breathing at rest. Cardiac: Regular rate and rhythm, no S3 or significant systolic murmur, no pericardial rub. Extremities: No pitting edema, distal pulses 2+. Skin: Warm and dry. Musculoskeletal: No kyphosis. Neuropsychiatric: Alert and oriented x3, affect grossly appropriate.  ECG:  EKG April 30, 2020 sinus rhythm with premature atrial complexes rate of 90, ST and T wave abnormality consider inferolateral ischemia, prolonged QT  Recent Labwork: 04/27/2020: ALT 15; AST 24 05/05/2020: Magnesium 1.9 08/05/2020: BUN 18; Creatinine, Ser 0.88; Hemoglobin 11.9; Platelets 214; Potassium 4.1; Sodium 138     Component Value Date/Time   CHOL 100 04/08/2020 0441   TRIG 83 04/08/2020 0441   HDL 35  (L) 04/08/2020 0441   CHOLHDL 2.9 04/08/2020 0441   VLDL 17 04/08/2020 0441   LDLCALC 48 04/08/2020 0441    Other Studies Reviewed Today:  Echocardiogram 06/25/2020 1. Left ventricular ejection fraction, by estimation, is 60 to 65%. The left ventricle has  normal function. The left ventricle has no regional wall motion abnormalities. There is mild left ventricular hypertrophy. Left ventricular diastolic parameters are consistent with Grade I diastolic dysfunction (impaired relaxation). 2. Right ventricular systolic function is normal. The right ventricular size is normal. There is normal pulmonary artery systolic pressure. The estimated right ventricular systolic pressure is 88.8 mmHg. 3. Left atrial size was mildly dilated. 4. Right atrial size was mildly dilated. 5. The mitral valve is degenerative. Mild mitral valve regurgitation. Moderate mitral annular calcification. 6. The aortic valve is tricuspid. There is severe calcifcation of the aortic valve. Aortic valve regurgitation is trivial. Moderate to severe aortic valve stenosis. Aortic valve mean gradient measures 32.0 mmHg. Aortic valve Vmax measures 3.57 m/s. Dimentionless index 0.34. 7. The inferior vena cava is normal in size with   Echo 10/17/19 IMPRESSIONS  1. Left ventricular ejection fraction, by visual estimation, is 60 to  65%. The left ventricle has normal function. There is mildly increased  left ventricular hypertrophy.  2. Elevated left ventricular end-diastolic pressure.  3. Left ventricular diastolic parameters are consistent with Grade I  diastolic dysfunction (impaired relaxation).  4. The left ventricle has no regional wall motion abnormalities.  5. Global right ventricle has normal systolic function.The right  ventricular size is normal. No increase in right ventricular wall  thickness.  6. Left atrial size was normal.  7. Right atrial size was normal.  8. Moderate mitral annular calcification.    9. The mitral valve is degenerative. Trivial mitral valve regurgitation.  Mild mitral stenosis.  10. The tricuspid valve is grossly normal.  11. The tricuspid valve is grossly normal. Tricuspid valve regurgitation  is mild.  12. The aortic valve is tricuspid. Aortic valve regurgitation is not  visualized. Moderate to severe aortic valve stenosis.  13. The pulmonic valve was grossly normal. Pulmonic valve regurgitation is  not visualized.  14. Moderately elevated pulmonary artery systolic pressure.  15. The inferior vena cava is normal in size with greater than 50%  respiratory variability, suggesting right atrial pressure of 3 mmHg.   FINDINGS  Left Ventricle: Left ventricular ejection fraction, by visual estimation,  is 60 to 65%. The left ventricle has normal function. The left ventricle  has no regional wall motion abnormalities. There is mildly increased left  ventricular hypertrophy.  Concentric left ventricular hypertrophy. Left ventricular diastolic  parameters are consistent with Grade I diastolic dysfunction (impaired  relaxation). Elevated left ventricular end-diastolic pressure.   Right Ventricle: The right ventricular size is normal. No increase in  right ventricular wall thickness. Global RV systolic function is has  normal systolic function. The tricuspid regurgitant velocity is 2.86 m/s,  and with an assumed right atrial pressure  of 10 mmHg, the estimated right ventricular systolic pressure is  moderately elevated at 42.7 mmHg.   Left Atrium: Left atrial size was normal in size.   Right Atrium: Right atrial size was normal in size   Pericardium: There is no evidence of pericardial effusion.   Mitral Valve: The mitral valve is degenerative in appearance. There is  mild thickening of the mitral valve leaflet(s). There is mild  calcification of the mitral valve leaflet(s). Moderate mitral annular  calcification. Trivial mitral valve regurgitation.  Mild mitral  valve stenosis by observation. MV peak gradient, 11.0 mmHg.   Tricuspid Valve: The tricuspid valve is grossly normal. Tricuspid valve  regurgitation is mild.   Aortic Valve: The aortic valve is tricuspid. . There is mild thickening  and mild calcification  of the aortic valve. Aortic valve regurgitation is  not visualized. Moderate to severe aortic stenosis is present. Moderate  aortic valve annular calcification.  There is mild thickening of the aortic valve. There is mild calcification  of the aortic valve. Aortic valve mean gradient measures 34.3 mmHg. Aortic  valve peak gradient measures 57.1 mmHg.   Pulmonic Valve: The pulmonic valve was grossly normal. Pulmonic valve  regurgitation is not visualized. Pulmonic regurgitation is not visualized.   Aorta: The aortic root is normal in size and structure.   Venous: The inferior vena cava is normal in size with greater than 50%  respiratory variability, suggesting right atrial pressure of 3 mmHg.   IAS/Shunts: No atrial level shunt detected by color flow Doppler.    LEFT VENTRICLE  PLAX 2D  LVIDd:     3.77 cm Diastology  LVIDs:     2.97 cm LV e' lateral:  5.03 cm/s  LV PW:     1.32 cm LV E/e' lateral: 25.6  LV IVS:    1.04 cm LV e' medial:  4.30 cm/s  LV SV:     27 ml  LV E/e' medial: 30.0  LV SV Index:  14.31    RIGHT VENTRICLE  RV S prime:   10.90 cm/s  TAPSE (M-mode): 2.2 cm   LEFT ATRIUM       Index    RIGHT ATRIUM      Index  LA diam:    3.70 cm 2.04 cm/m RA Area:   14.30 cm  LA Vol (A2C):  68.1 ml 37.50 ml/m RA Volume:  36.50 ml 20.10 ml/m  LA Vol (A4C):  30.5 ml 16.79 ml/m  LA Biplane Vol: 50.6 ml 27.86 ml/m  AORTIC VALVE  AV Vmax:      377.67 cm/s  AV Vmean:     274.000 cm/s  AV VTI:      0.930 m  AV Peak Grad:   57.1 mmHg  AV Mean Grad:   34.3 mmHg  LVOT Vmax:     139.67 cm/s  LVOT Vmean:    107.267 cm/s  LVOT VTI:      0.342 m  LVOT/AV VTI ratio: 0.37   AORTA  Ao Root diam: 2.30 cm   MITRAL VALVE             TRICUSPID VALVE  MV Area (PHT): 1.76 cm       TR Peak grad:  32.7 mmHg  MV Peak grad: 11.0 mmHg       TR Vmax:    286.00 cm/s  MV Mean grad: 4.0 mmHg  MV Vmax:    1.66 m/s       SHUNTS  MV Vmean:   97.4 cm/s       Systemic VTI: 0.34 m  MV VTI:    0.53 m  MV PHT:    124.70 msec  MV Decel Time: 430 msec  MV E velocity: 129.00 cm/s 103 cm/s  MV A velocity: 160.00 cm/s 70.3 cm/s  MV E/A ratio: 0.81    1.5     Assessment and Plan:   1.  Chest pain unspecified Patient had a recent visit to the emergency room for chest pain and was worked up for ACS.  EKG, lab work including troponins x2, chest x-ray were all negative.  States she continues with some twinges of chest pain sometimes radiating up to the mid clavicular superior area of her left chest down today left lower rib cage.  Denies any  radiation to neck, arm, back or jaw.  No associated nausea, vomiting, diaphoresis.  Please get a Lexiscan stress test.   2.  Palpitations. Patient states she has intermittent feelings of uneasy sensation in her chest with funny/skipped heartbeats at intervals.  Please get 7-day ZIO monitor.   2. Aortic valve stenosis, etiology of cardiac valve disease unspecified Last echocardiogram January 2021 moderate to severe aortic valve stenosis.  Recent echocardiogram June 25, 2020: EF 60 to 65%.  G1 DD.  Mild MR, moderate to severe aortic valve stenosis.  Patient states she is noticing more activity intolerance with some dyspnea and exertional fatigue and has to rest frequently.  Please refer to valve clinic to see Dr. Burt Knack.  3. Essential hypertension Continue metoprolol 12.5 mg p.o. twice daily.  Blood pressure is elevated today but is usually normal.  Last 2 visits in epic show blood pressures well controlled prior to today.  4.  History of  CVA Patient recently had a GI bleed secondary to taking full dose aspirin and Plavix combination.  Plavix was discontinued and full dose aspirin was decreased to 81 mg daily.  Continue daily 81 mg aspirin.  No recent CVA or TIA-like symptoms.  Continue atorvastatin 40 mg daily.  She is status post R CEA  Medication Adjustments/Labs and Tests Ordered: Current medicines are reviewed at length with the patient today.  Concerns regarding medicines are outlined above.   Disposition: Follow-up with Dr. Domenic Polite or APP in 2 months  Signed, Levell July, NP 08/09/2020 3:33 PM    [facility] [place], [place], [place] [address] Phone: [phone]; Fax: [phone]

## 2020-08-09 ENCOUNTER — Encounter: Payer: Self-pay | Admitting: Family Medicine

## 2020-08-09 ENCOUNTER — Ambulatory Visit (INDEPENDENT_AMBULATORY_CARE_PROVIDER_SITE_OTHER): Payer: Medicare Other | Admitting: Family Medicine

## 2020-08-09 ENCOUNTER — Encounter: Payer: Self-pay | Admitting: *Deleted

## 2020-08-09 VITALS — BP 148/82 | HR 87 | Ht 65.0 in | Wt 169.4 lb

## 2020-08-09 DIAGNOSIS — I10 Essential (primary) hypertension: Secondary | ICD-10-CM | POA: Diagnosis not present

## 2020-08-09 DIAGNOSIS — Z8673 Personal history of transient ischemic attack (TIA), and cerebral infarction without residual deficits: Secondary | ICD-10-CM | POA: Diagnosis not present

## 2020-08-09 DIAGNOSIS — I350 Nonrheumatic aortic (valve) stenosis: Secondary | ICD-10-CM | POA: Diagnosis not present

## 2020-08-09 DIAGNOSIS — R002 Palpitations: Secondary | ICD-10-CM

## 2020-08-09 DIAGNOSIS — R079 Chest pain, unspecified: Secondary | ICD-10-CM | POA: Diagnosis not present

## 2020-08-09 NOTE — Patient Instructions (Signed)
Medication Instructions:  Continue all current medications.  Labwork: none  Testing/Procedures:  Your physician has recommended that you wear a 7 day event monitor. Event monitors are medical devices that record the heart's electrical activity. Doctors most often [place] these monitors to diagnose arrhythmias. Arrhythmias are problems with the speed or rhythm of the heartbeat. The monitor is a small, portable device. You can wear one while you do your normal daily activities. This is usually used to diagnose what is causing palpitations/syncope (passing out).  Your physician has requested that you have a lexiscan myoview. For further information please visit [email or web address]. Please follow instruction sheet, as given.  Office will contact with results via phone or letter.    Follow-Up: 2 months   Any Other Special Instructions Will Be Listed Below (If Applicable). You have been referred to:  TAVR clinic  If you need a refill on your cardiac medications before your next appointment, please call your pharmacy.

## 2020-08-16 ENCOUNTER — Ambulatory Visit (INDEPENDENT_AMBULATORY_CARE_PROVIDER_SITE_OTHER): Payer: Medicare Other

## 2020-08-16 DIAGNOSIS — R002 Palpitations: Secondary | ICD-10-CM | POA: Diagnosis not present

## 2020-08-19 ENCOUNTER — Ambulatory Visit (INDEPENDENT_AMBULATORY_CARE_PROVIDER_SITE_OTHER): Payer: Medicare Other | Admitting: Cardiovascular Disease

## 2020-08-19 ENCOUNTER — Other Ambulatory Visit: Payer: Self-pay

## 2020-08-19 ENCOUNTER — Encounter: Payer: Self-pay | Admitting: Cardiovascular Disease

## 2020-08-19 VITALS — BP 114/60 | HR 72 | Ht 65.0 in | Wt 170.6 lb

## 2020-08-19 DIAGNOSIS — I350 Nonrheumatic aortic (valve) stenosis: Secondary | ICD-10-CM | POA: Diagnosis not present

## 2020-08-19 LAB — BASIC METABOLIC PANEL
BUN/Creatinine Ratio: 18 (ref 12–28)
BUN: 18 mg/dL (ref 10–36)
CO2: 22 mmol/L (ref 20–29)
Calcium: 9.2 mg/dL (ref 8.7–10.3)
Chloride: 103 mmol/L (ref 96–106)
Creatinine, Ser: 1.01 mg/dL — ABNORMAL HIGH (ref 0.57–1.00)
GFR calc Af Amer: 55 mL/min/{1.73_m2} — ABNORMAL LOW (ref >59)
GFR calc non Af Amer: 47 mL/min/{1.73_m2} — ABNORMAL LOW (ref >59)
Glucose: 117 mg/dL — ABNORMAL HIGH (ref 65–99)
Potassium: 4.5 mmol/L (ref 3.5–5.2)
Sodium: 139 mmol/L (ref 134–144)

## 2020-08-19 LAB — CBC WITH DIFFERENTIAL/PLATELET
Basophils Absolute: 0 10*3/uL (ref 0.0–0.2)
Basos: 0 %
EOS (ABSOLUTE): 0.1 10*3/uL (ref 0.0–0.4)
Eos: 2 %
Hematocrit: 36.6 % (ref 34.0–46.6)
Hemoglobin: 11.8 g/dL (ref 11.1–15.9)
Immature Grans (Abs): 0 10*3/uL (ref 0.0–0.1)
Immature Granulocytes: 0 %
Lymphocytes Absolute: 2.1 10*3/uL (ref 0.7–3.1)
Lymphs: 28 %
MCH: 28 pg (ref 26.6–33.0)
MCHC: 32.2 g/dL (ref 31.5–35.7)
MCV: 87 fL (ref 79–97)
Monocytes Absolute: 0.8 10*3/uL (ref 0.1–0.9)
Monocytes: 11 %
Neutrophils Absolute: 4.5 10*3/uL (ref 1.4–7.0)
Neutrophils: 59 %
Platelets: 248 10*3/uL (ref 150–450)
RBC: 4.21 x10E6/uL (ref 3.77–5.28)
RDW: 14.8 % (ref 11.7–15.4)
WBC: 7.6 10*3/uL (ref 3.4–10.8)

## 2020-08-19 NOTE — H&P (View-Only) (Signed)
[facility]   MULTIDISCIPLINARY HEART VALVE TEAM  Date:  08/19/2020   ID:  Denise Parrish, DOB 1923/06/16, MRN [id number]  PCP:  [name], MD   Chief Complaint  Patient presents with  . Shortness of Breath     HISTORY OF PRESENT ILLNESS: Denise Parrish is a 86 y.o. female who presents for evaluation of aortic stenosis, referred by Dr Johnsie Cancel and Levell July, NP.  The patient is here with her daughter today. The patient has known of a heart murmur for many decades. She has been followed by Dr Johnsie Cancel regularly and has been noted to have moderate-severe aortic stenosis over most recent echo studies.   She has tried to stay active as much as she can. She loves going to [employer], and went to 6 games this year. Her kids bought her the MLB channel this year because she loves watching baseball. The year prior she was able to walk into the stadium, but this year she had to use a wheelchair. 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   She had a complex hospitalization in August of this year with GI bleed, demand ischemia, and mechanical fall in the hospital with a fracture of the left fibula.  She had a slow recovery from this, but is essentially back to her baseline now.  The patient had a stroke in January of this year and was found to have symptomatic right carotid stenosis.  She was treated with right carotid endarterectomy at that time.  She is now primarily limited by shortness of breath. This has been present for at least a few years, but it has worsened over the past 6 months. She is now short of breath with low-level activity such as walking short distances on level ground.  She denies chest pain or pressure with physical activity.  She denies orthopnea or PND.  She denies leg swelling.  No lightheadedness or syncope.  The patient has full upper and lower dentures.   Past Medical  History:  Diagnosis Date  . Anxiety   . Aortic sclerosis    Long-standing heart murmur  . Carotid stenosis, right 10/16/2019   75% on CTA neck  . Chest pain 2006   +palpitations; minimal CAD at cath in 2005; normal EF; negative stress echo in 09/2008  . Cholelithiasis    on u/s 09/2011  . Degenerative joint disease    Right THA in 1995; cervical discectomy and fusion-2001  . Diabetes ([place])   . Diabetes mellitus, type 2 (HCC)    + neuropathy  . Emphysema   . Gastroesophageal reflux disease   . Heart attack ([place])   . Hiatal hernia   . Hyperlipidemia   . Hypertension   . IDA (iron deficiency anemia)    labs 09/2011  . Mild cognitive impairment   . Obesity   . Osteoporosis   . Pneumonia   . Sigmoid diverticulosis   . Small bowel lesion    On Given's capsule; Dr Kathleene Hazel 07/2012, no further w/u needed  . Stroke ([place])   . TIA (transient ischemic attack)     Current Outpatient Medications  Medication Sig Dispense Refill  . ALPRAZolam (XANAX) 0.5 MG tablet Take 0.5 mg by mouth at bedtime as needed for anxiety.    . Ascorbic Acid (VITAMIN C PO) Take 1 tablet by mouth daily.    [name] aspirin 81 MG chewable  tablet Chew 1 tablet (81 mg total) by mouth daily.    [name] atorvastatin (LIPITOR) 40 MG tablet Take 1 tablet (40 mg total) by mouth daily at 6 PM. 30 tablet 2  . cholecalciferol (VITAMIN D) 25 MCG (1000 UNIT) tablet Take 1,000 Units by mouth daily.    [name] glimepiride (AMARYL) 1 MG tablet Take 1 mg by mouth daily with breakfast.    . Glycerin-Hypromellose-PEG 400 (VISINE DRY EYE OP) Apply 1 drop to eye 3 (three) times daily as needed (for dry eye relief).     . LUTEIN PO Take by mouth daily.    . meclizine (ANTIVERT) 12.5 MG tablet Take 1 tablet (12.5 mg total) by mouth 3 (three) times daily as needed for dizziness. 20 tablet 0  . metoprolol tartrate (LOPRESSOR) 25 MG tablet Take 0.5 tablets (12.5 mg total) by mouth 2 (two) times daily. 30 tablet 0  . Multiple Vitamins-Minerals  (PRESERVISION AREDS 2 PO) Take 1 tablet by mouth See admin instructions. Takes 1 tablet in the morning and 1 tablet at night for eyes.    [name] omeprazole (PRILOSEC) 20 MG capsule Take 20 mg by mouth Daily.    . vitamin E 180 MG (400 UNITS) capsule Take 400 Units by mouth daily.     No current facility-administered medications for this visit.    ALLERGIES:   Patient has no known allergies.   SOCIAL HISTORY:  The patient  reports that she has never smoked. She has never used smokeless tobacco. She reports that she does not drink alcohol and does not use drugs.   FAMILY HISTORY:  The patient's family history includes CAD in her father; Cancer in her brother; Cirrhosis in her mother; Diabetes in her mother; Heart failure in her father; Hypertension in her father; Uterine cancer in her sister.   REVIEW OF SYSTEMS:  Positive for leg pain, back pain, fatigue.   All other systems are reviewed and negative.   PHYSICAL EXAM: VS:  BP 114/60   Pulse 72   Ht 5' 5"  (1.651 m)   Wt 170 lb 9.6 oz (77.4 kg)   SpO2 97%   BMI 28.39 kg/m  , BMI Body mass index is 28.39 kg/m. GEN: Well nourished, well developed, pleasant elderly woman in no acute distress HEENT: normal Neck: No JVD. carotids 2+ with bilateral bruits Cardiac: The heart is RRR with 3/6 harsh late peaking systolic murmur at the right upper sternal border, A2 present. No edema.  Respiratory:  clear to auscultation bilaterally GI: soft, nontender, nondistended, + BS MS: no deformity or atrophy Skin: warm and dry, no rash Neuro:  Strength and sensation are intact Psych: euthymic mood, full affect   RECENT LABS: 04/27/2020: ALT 15 05/05/2020: Magnesium 1.9 08/05/2020: BUN 18; Creatinine, Ser 0.88; Hemoglobin 11.9; Platelets 214; Potassium 4.1; Sodium 138  04/08/2020: Cholesterol 100; HDL 35; LDL Cholesterol 48; Total CHOL/HDL Ratio 2.9; Triglycerides 83; VLDL 17   Estimated Creatinine Clearance: 39.4 mL/min (by C-G formula based on SCr of  0.88 mg/dL).   Wt Readings from Last 3 Encounters:  08/19/20 170 lb 9.6 oz (77.4 kg)  08/09/20 169 lb 6.4 oz (76.8 kg)  08/05/20 163 lb (73.9 kg)     CARDIAC STUDIES:  Echo:  1. Left ventricular ejection fraction, by estimation, is 60 to 65%. The  left ventricle has normal function. The left ventricle has no regional  wall motion abnormalities. There is mild left ventricular hypertrophy.  Left ventricular diastolic parameters  are consistent with Grade  I diastolic dysfunction (impaired relaxation).  2. Right ventricular systolic function is normal. The right ventricular  size is normal. There is normal pulmonary artery systolic pressure. The  estimated right ventricular systolic pressure is 27.7 mmHg.  3. Left atrial size was mildly dilated.  4. Right atrial size was mildly dilated.  5. The mitral valve is degenerative. Mild mitral valve regurgitation.  Moderate mitral annular calcification.  6. The aortic valve is tricuspid. There is severe calcifcation of the  aortic valve. Aortic valve regurgitation is trivial. Moderate to severe  aortic valve stenosis. Aortic valve mean gradient measures 32.0 mmHg.  Aortic valve Vmax measures 3.57 m/s.  Dimentionless index 0.34.  7. The inferior vena cava is normal in size with greater than 50%  respiratory variability, suggesting right atrial pressure of 3 mmHg.   LEFT VENTRICLE  PLAX 2D  LVIDd:     4.19 cm  LVIDs:     2.66 cm  LV PW:     1.30 cm  LV IVS:    1.22 cm  LVOT diam:   1.50 cm  LV SV:     56  LV SV Index:  31  LVOT Area:   1.77 cm     RIGHT VENTRICLE  RV S prime:   10.20 cm/s  TAPSE (M-mode): 1.9 cm   LEFT ATRIUM      Index    RIGHT ATRIUM      Index  LA diam:   3.70 cm 2.03 cm/m RA Area:   21.20 cm  LA Vol (A4C): 66.9 ml 36.70 ml/m RA Volume:  66.50 ml 36.48 ml/m  AORTIC VALVE  AV Area (Vmax):  0.65 cm  AV Area (Vmean):  0.62 cm  AV Area (VTI):    0.60 cm  AV Vmax:      357.00 cm/s  AV Vmean:     269.000 cm/s  AV VTI:      0.942 m  AV Peak Grad:   51.0 mmHg  AV Mean Grad:   32.0 mmHg  LVOT Vmax:     130.50 cm/s  LVOT Vmean:    93.950 cm/s  LVOT VTI:     0.318 m  LVOT/AV VTI ratio: 0.34    AORTA  Ao Root diam: 2.80 cm   MITRAL VALVE        TRICUSPID VALVE  MV Area (PHT): 2.65 cm   TR Peak grad:  30.5 mmHg  MV Decel Time: 286 msec   TR Vmax:    276.00 cm/s  MV E velocity: 172.67 cm/s  MV A velocity: 186.00 cm/s SHUNTS  MV E/A ratio: 0.93     Systemic VTI: 0.32 m               Systemic Diam: 1.50 cm   ASSESSMENT AND PLAN: 1.  This is a 86 year old woman with progressive dyspnea, New York Heart Association functional class III symptoms who has at least moderately severe aortic stenosis.  I have had a long discussion with the patient and her daughter who is present today.  I have reviewed the natural history of aortic stenosis with the patient and discussed the limitations of medical therapy and the poor prognosis associated with symptomatic aortic stenosis. We have reviewed potential treatment options, including palliative medical therapy, conventional surgical aortic valve replacement, and transcatheter aortic valve replacement. We discussed treatment options in the context of the patient's specific comorbid medical conditions.  I have personally reviewed the patient's echo images which demonstrate normal LV and  RV function, moderate to severe calcification and restriction of the aortic valve leaflets with peak and mean pressure gradients of 51 and 32 mmHg, respectively.  The dimensionless index is 0.36.  Her exam and echo findings are consistent with moderately severe aortic stenosis.  What is concerning is that she clearly has progressive symptoms and significant functional limitation from exertional dyspnea.  She did have demand ischemia event when she was in  the hospital with gastrointestinal bleeding in August of this year.  She is at risk of obstructive coronary artery disease.  Her last heart catheterization was 16 years ago at which time she had mild nonobstructive CAD.  We talked about the idea of continued surveillance and medical therapy in the setting of her advanced age and comorbid conditions.  We also discussed proceeding with diagnostic right and left heart catheterization to assess her hemodynamics and evaluate for obstructive coronary artery disease.  Because of her progressive symptoms and desire to be more active, she would like to proceed with further evaluation.  I have reviewed the risks, indications, and alternatives to cardiac catheterization, possible angioplasty, and stenting with the patient. Risks include but are not limited to bleeding, infection, vascular injury, stroke, myocardial infection, arrhythmia, kidney injury, radiation-related injury in the case of prolonged fluoroscopy use, emergency cardiac surgery, and death. The patient understands the risks of serious complication is 1-2 in 2481 with diagnostic cardiac cath and 1-2% or less with angioplasty/stenting. I discussed the fact that normal cath risks are increased because of her age and comorbid conditions (stroke within past year), but risks are certainly not prohibitive. She understands and would like to proceed.  We will plan further evaluation pending her cardiac catheterization results.  If moderate aortic stenosis is confirmed, will obviously continue with surveillance and medical therapy.  However, if she has findings consistent with severe aortic stenosis, would proceed with TAVR evaluation in the context of her class III symptoms.   Deatra James 08/19/2020 10:53 AM     [facility] [address] [place] [address]  [phone] (office) [phone] (fax)

## 2020-08-19 NOTE — Progress Notes (Signed)
[facility]   MULTIDISCIPLINARY HEART VALVE TEAM  Date:  08/19/2020   ID:  Denise Parrish, DOB 19-Aug-1923, MRN [id number]  PCP:  [name], MD   Chief Complaint  Patient presents with  . Shortness of Breath     HISTORY OF PRESENT ILLNESS: Denise Parrish is a 86 y.o. female who presents for evaluation of aortic stenosis, referred by Dr Johnsie Cancel and Levell July, NP.  The patient is here with her daughter today. The patient has known of a heart murmur for many decades. She has been followed by Dr Johnsie Cancel regularly and has been noted to have moderate-severe aortic stenosis over most recent echo studies.   She has tried to stay active as much as she can. She loves going to [employer], and went to 6 games this year. Her kids bought her the MLB channel this year because she loves watching baseball. The year prior she was able to walk into the stadium, but this year she had to use a wheelchair. 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   She had a complex hospitalization in August of this year with GI bleed, demand ischemia, and mechanical fall in the hospital with a fracture of the left fibula.  She had a slow recovery from this, but is essentially back to her baseline now.  The patient had a stroke in January of this year and was found to have symptomatic right carotid stenosis.  She was treated with right carotid endarterectomy at that time.  She is now primarily limited by shortness of breath. This has been present for at least a few years, but it has worsened over the past 6 months. She is now short of breath with low-level activity such as walking short distances on level ground.  She denies chest pain or pressure with physical activity.  She denies orthopnea or PND.  She denies leg swelling.  No lightheadedness or syncope.  The patient has full upper and lower dentures.   Past Medical  History:  Diagnosis Date  . Anxiety   . Aortic sclerosis    Long-standing heart murmur  . Carotid stenosis, right 10/16/2019   75% on CTA neck  . Chest pain 2006   +palpitations; minimal CAD at cath in 2005; normal EF; negative stress echo in 09/2008  . Cholelithiasis    on u/s 09/2011  . Degenerative joint disease    Right THA in 1995; cervical discectomy and fusion-2001  . Diabetes ([place])   . Diabetes mellitus, type 2 (HCC)    + neuropathy  . Emphysema   . Gastroesophageal reflux disease   . Heart attack ([place])   . Hiatal hernia   . Hyperlipidemia   . Hypertension   . IDA (iron deficiency anemia)    labs 09/2011  . Mild cognitive impairment   . Obesity   . Osteoporosis   . Pneumonia   . Sigmoid diverticulosis   . Small bowel lesion    On Given's capsule; Dr Kathleene Hazel 07/2012, no further w/u needed  . Stroke ([place])   . TIA (transient ischemic attack)     Current Outpatient Medications  Medication Sig Dispense Refill  . ALPRAZolam (XANAX) 0.5 MG tablet Take 0.5 mg by mouth at bedtime as needed for anxiety.    . Ascorbic Acid (VITAMIN C PO) Take 1 tablet by mouth daily.    [name] aspirin 81 MG chewable  tablet Chew 1 tablet (81 mg total) by mouth daily.    [name] atorvastatin (LIPITOR) 40 MG tablet Take 1 tablet (40 mg total) by mouth daily at 6 PM. 30 tablet 2  . cholecalciferol (VITAMIN D) 25 MCG (1000 UNIT) tablet Take 1,000 Units by mouth daily.    [name] glimepiride (AMARYL) 1 MG tablet Take 1 mg by mouth daily with breakfast.    . Glycerin-Hypromellose-PEG 400 (VISINE DRY EYE OP) Apply 1 drop to eye 3 (three) times daily as needed (for dry eye relief).     . LUTEIN PO Take by mouth daily.    . meclizine (ANTIVERT) 12.5 MG tablet Take 1 tablet (12.5 mg total) by mouth 3 (three) times daily as needed for dizziness. 20 tablet 0  . metoprolol tartrate (LOPRESSOR) 25 MG tablet Take 0.5 tablets (12.5 mg total) by mouth 2 (two) times daily. 30 tablet 0  . Multiple Vitamins-Minerals  (PRESERVISION AREDS 2 PO) Take 1 tablet by mouth See admin instructions. Takes 1 tablet in the morning and 1 tablet at night for eyes.    [name] omeprazole (PRILOSEC) 20 MG capsule Take 20 mg by mouth Daily.    . vitamin E 180 MG (400 UNITS) capsule Take 400 Units by mouth daily.     No current facility-administered medications for this visit.    ALLERGIES:   Patient has no known allergies.   SOCIAL HISTORY:  The patient  reports that she has never smoked. She has never used smokeless tobacco. She reports that she does not drink alcohol and does not use drugs.   FAMILY HISTORY:  The patient's family history includes CAD in her father; Cancer in her brother; Cirrhosis in her mother; Diabetes in her mother; Heart failure in her father; Hypertension in her father; Uterine cancer in her sister.   REVIEW OF SYSTEMS:  Positive for leg pain, back pain, fatigue.   All other systems are reviewed and negative.   PHYSICAL EXAM: VS:  BP 114/60   Pulse 72   Ht 5' 5"  (1.651 m)   Wt 170 lb 9.6 oz (77.4 kg)   SpO2 97%   BMI 28.39 kg/m  , BMI Body mass index is 28.39 kg/m. GEN: Well nourished, well developed, pleasant elderly woman in no acute distress HEENT: normal Neck: No JVD. carotids 2+ with bilateral bruits Cardiac: The heart is RRR with 3/6 harsh late peaking systolic murmur at the right upper sternal border, A2 present. No edema.  Respiratory:  clear to auscultation bilaterally GI: soft, nontender, nondistended, + BS MS: no deformity or atrophy Skin: warm and dry, no rash Neuro:  Strength and sensation are intact Psych: euthymic mood, full affect   RECENT LABS: 04/27/2020: ALT 15 05/05/2020: Magnesium 1.9 08/05/2020: BUN 18; Creatinine, Ser 0.88; Hemoglobin 11.9; Platelets 214; Potassium 4.1; Sodium 138  04/08/2020: Cholesterol 100; HDL 35; LDL Cholesterol 48; Total CHOL/HDL Ratio 2.9; Triglycerides 83; VLDL 17   Estimated Creatinine Clearance: 39.4 mL/min (by C-G formula based on SCr of  0.88 mg/dL).   Wt Readings from Last 3 Encounters:  08/19/20 170 lb 9.6 oz (77.4 kg)  08/09/20 169 lb 6.4 oz (76.8 kg)  08/05/20 163 lb (73.9 kg)     CARDIAC STUDIES:  Echo:  1. Left ventricular ejection fraction, by estimation, is 60 to 65%. The  left ventricle has normal function. The left ventricle has no regional  wall motion abnormalities. There is mild left ventricular hypertrophy.  Left ventricular diastolic parameters  are consistent with Grade  I diastolic dysfunction (impaired relaxation).  2. Right ventricular systolic function is normal. The right ventricular  size is normal. There is normal pulmonary artery systolic pressure. The  estimated right ventricular systolic pressure is 14.4 mmHg.  3. Left atrial size was mildly dilated.  4. Right atrial size was mildly dilated.  5. The mitral valve is degenerative. Mild mitral valve regurgitation.  Moderate mitral annular calcification.  6. The aortic valve is tricuspid. There is severe calcifcation of the  aortic valve. Aortic valve regurgitation is trivial. Moderate to severe  aortic valve stenosis. Aortic valve mean gradient measures 32.0 mmHg.  Aortic valve Vmax measures 3.57 m/s.  Dimentionless index 0.34.  7. The inferior vena cava is normal in size with greater than 50%  respiratory variability, suggesting right atrial pressure of 3 mmHg.   LEFT VENTRICLE  PLAX 2D  LVIDd:     4.19 cm  LVIDs:     2.66 cm  LV PW:     1.30 cm  LV IVS:    1.22 cm  LVOT diam:   1.50 cm  LV SV:     56  LV SV Index:  31  LVOT Area:   1.77 cm     RIGHT VENTRICLE  RV S prime:   10.20 cm/s  TAPSE (M-mode): 1.9 cm   LEFT ATRIUM      Index    RIGHT ATRIUM      Index  LA diam:   3.70 cm 2.03 cm/m RA Area:   21.20 cm  LA Vol (A4C): 66.9 ml 36.70 ml/m RA Volume:  66.50 ml 36.48 ml/m  AORTIC VALVE  AV Area (Vmax):  0.65 cm  AV Area (Vmean):  0.62 cm  AV Area (VTI):    0.60 cm  AV Vmax:      357.00 cm/s  AV Vmean:     269.000 cm/s  AV VTI:      0.942 m  AV Peak Grad:   51.0 mmHg  AV Mean Grad:   32.0 mmHg  LVOT Vmax:     130.50 cm/s  LVOT Vmean:    93.950 cm/s  LVOT VTI:     0.318 m  LVOT/AV VTI ratio: 0.34    AORTA  Ao Root diam: 2.80 cm   MITRAL VALVE        TRICUSPID VALVE  MV Area (PHT): 2.65 cm   TR Peak grad:  30.5 mmHg  MV Decel Time: 286 msec   TR Vmax:    276.00 cm/s  MV E velocity: 172.67 cm/s  MV A velocity: 186.00 cm/s SHUNTS  MV E/A ratio: 0.93     Systemic VTI: 0.32 m               Systemic Diam: 1.50 cm   ASSESSMENT AND PLAN: 1.  This is a 86 year old woman with progressive dyspnea, New York Heart Association functional class III symptoms who has at least moderately severe aortic stenosis.  I have had a long discussion with the patient and her daughter who is present today.  I have reviewed the natural history of aortic stenosis with the patient and discussed the limitations of medical therapy and the poor prognosis associated with symptomatic aortic stenosis. We have reviewed potential treatment options, including palliative medical therapy, conventional surgical aortic valve replacement, and transcatheter aortic valve replacement. We discussed treatment options in the context of the patient's specific comorbid medical conditions.  I have personally reviewed the patient's echo images which demonstrate normal LV and  RV function, moderate to severe calcification and restriction of the aortic valve leaflets with peak and mean pressure gradients of 51 and 32 mmHg, respectively.  The dimensionless index is 0.36.  Her exam and echo findings are consistent with moderately severe aortic stenosis.  What is concerning is that she clearly has progressive symptoms and significant functional limitation from exertional dyspnea.  She did have demand ischemia event when she was in  the hospital with gastrointestinal bleeding in August of this year.  She is at risk of obstructive coronary artery disease.  Her last heart catheterization was 16 years ago at which time she had mild nonobstructive CAD.  We talked about the idea of continued surveillance and medical therapy in the setting of her advanced age and comorbid conditions.  We also discussed proceeding with diagnostic right and left heart catheterization to assess her hemodynamics and evaluate for obstructive coronary artery disease.  Because of her progressive symptoms and desire to be more active, she would like to proceed with further evaluation.  I have reviewed the risks, indications, and alternatives to cardiac catheterization, possible angioplasty, and stenting with the patient. Risks include but are not limited to bleeding, infection, vascular injury, stroke, myocardial infection, arrhythmia, kidney injury, radiation-related injury in the case of prolonged fluoroscopy use, emergency cardiac surgery, and death. The patient understands the risks of serious complication is 1-2 in 5537 with diagnostic cardiac cath and 1-2% or less with angioplasty/stenting. I discussed the fact that normal cath risks are increased because of her age and comorbid conditions (stroke within past year), but risks are certainly not prohibitive. She understands and would like to proceed.  We will plan further evaluation pending her cardiac catheterization results.  If moderate aortic stenosis is confirmed, will obviously continue with surveillance and medical therapy.  However, if she has findings consistent with severe aortic stenosis, would proceed with TAVR evaluation in the context of her class III symptoms.   Deatra James 08/19/2020 10:53 AM     [facility] [address] [place] [address]  [phone] (office) [phone] (fax)

## 2020-08-19 NOTE — Patient Instructions (Addendum)
COVID SCREENING INFORMATION (12/20): You are scheduled for your drive-thru COVID screening on 09/06/20 between 10AM and 11AM. Pre-Procedural COVID-19 Testing Site [address] [place], [place] [address] You will need to go home after your screening and quarantine until your procedure.   CATHETERIZATION INSTRUCTIONS (12/22):  You are scheduled for a Cardiac Catheterization on Wednesday, December 22 with Dr. Sherren Mocha.  1. Please arrive at the [facility] (Main Entrance A) at [facility]: [address] [place], [place] [address] at 9:30AM (This time is two hours before your procedure to ensure your preparation). Free valet parking service is available. You are allowed ONE visitor in the waiting room during your procedure. Both you and your guest must wear masks. Special note: Every effort is made to have your procedure done on time. Please understand that emergencies sometimes delay scheduled procedures.  2. Diet: Do not eat solid foods after midnight.  You may have clear liquids until 5am upon the day of the procedure.  3. Labs: TODAY! BMET, CBC  4. Medication instructions in preparation for your procedure:  1) HOLD AMARYL the morning of your cath  2) MAKE SURE TO TAKE YOUR ASPIRIN the morning of your procedure  3) You may take your other medications as directed with sips of water   5. Plan for one night stay--bring personal belongings. 6. Bring a current list of your medications and current insurance cards. 7. You MUST have a responsible person to drive you home. 8. Someone MUST be with you the first 24 hours after you arrive home or your discharge will be delayed. 9. Please wear clothes that are easy to get on and off and wear slip-on shoes.  Thank you for allowing [place] to care for you!   -- [facility]

## 2020-08-20 ENCOUNTER — Encounter (HOSPITAL_COMMUNITY): Payer: Medicare Other

## 2020-09-03 ENCOUNTER — Other Ambulatory Visit: Payer: Self-pay

## 2020-09-03 DIAGNOSIS — I350 Nonrheumatic aortic (valve) stenosis: Secondary | ICD-10-CM

## 2020-09-03 NOTE — Addendum Note (Signed)
Addended by: Merlene Laughter on: 09/03/2020 12:29 PM   Modules accepted: Orders

## 2020-09-06 ENCOUNTER — Other Ambulatory Visit: Payer: Self-pay

## 2020-09-06 ENCOUNTER — Other Ambulatory Visit: Payer: Medicare Other | Admitting: *Deleted

## 2020-09-06 ENCOUNTER — Other Ambulatory Visit (HOSPITAL_COMMUNITY)
Admission: RE | Admit: 2020-09-06 | Discharge: 2020-09-06 | Disposition: A | Discharge status: Home / Self Care | Payer: Medicare Other | Source: Ambulatory Visit | Attending: Cardiovascular Disease | Admitting: Cardiovascular Disease

## 2020-09-06 ENCOUNTER — Telehealth: Payer: Self-pay | Admitting: *Deleted

## 2020-09-06 ENCOUNTER — Encounter (INDEPENDENT_AMBULATORY_CARE_PROVIDER_SITE_OTHER): Payer: Self-pay

## 2020-09-06 DIAGNOSIS — Z20822 Contact with and (suspected) exposure to covid-19: Secondary | ICD-10-CM | POA: Insufficient documentation

## 2020-09-06 DIAGNOSIS — Z01812 Encounter for preprocedural laboratory examination: Secondary | ICD-10-CM | POA: Insufficient documentation

## 2020-09-06 DIAGNOSIS — I350 Nonrheumatic aortic (valve) stenosis: Secondary | ICD-10-CM

## 2020-09-06 LAB — BASIC METABOLIC PANEL
BUN/Creatinine Ratio: 18 (ref 12–28)
BUN: 16 mg/dL (ref 10–36)
CO2: 23 mmol/L (ref 20–29)
Calcium: 9.1 mg/dL (ref 8.7–10.3)
Chloride: 107 mmol/L — ABNORMAL HIGH (ref 96–106)
Creatinine, Ser: 0.91 mg/dL (ref 0.57–1.00)
GFR calc Af Amer: 62 mL/min/{1.73_m2} (ref >59)
GFR calc non Af Amer: 54 mL/min/{1.73_m2} — ABNORMAL LOW (ref >59)
Glucose: 147 mg/dL — ABNORMAL HIGH (ref 65–99)
Potassium: 4.3 mmol/L (ref 3.5–5.2)
Sodium: 142 mmol/L (ref 134–144)

## 2020-09-06 LAB — SARS CORONAVIRUS 2 (TAT 6-24 HRS): SARS Coronavirus 2: NEGATIVE

## 2020-09-06 NOTE — Telephone Encounter (Signed)
-----   Message from Verta Ellen., NP sent at 09/06/2020 11:54 AM EST ----- Regarding: ZIO monitor results This monitor report was read by Dr. Johnsie Cancel.  She had no significant arrhythmias per his report.  See results below   Study Highlights  NSR Periods of wenckebach Short period 9 beats atrial tachycardia No sustained arrhythmias

## 2020-09-06 NOTE — Telephone Encounter (Signed)
[name] informed. Copy sent to PCP

## 2020-09-07 ENCOUNTER — Ambulatory Visit: Payer: Medicare Other

## 2020-09-07 ENCOUNTER — Encounter (HOSPITAL_COMMUNITY): Payer: Medicare Other

## 2020-09-07 ENCOUNTER — Telehealth: Payer: Self-pay | Admitting: *Deleted

## 2020-09-07 NOTE — Telephone Encounter (Signed)
Pt contacted pre-catheterization scheduled at [facility] for: Wednesday September 08, 2020 11:30 AM Verified arrival time and place: [facility]) at: 9:30 AM   No solid food after midnight prior to cath, clear liquids until 5 AM day of procedure.  Hold: Glimepiride-AM of procedure   Except hold medications AM meds can be  taken pre-cath with sips of water including: ASA 81 mg   Confirmed patient has responsible adult to drive home post procedure and be with patient first 24 hours after arriving home: yes  You are allowed ONE visitor in the waiting room during the time you are at the hospital for your procedure. Both you and your visitor must wear a mask once you enter the hospital.       COVID-19 Pre-Screening Questions:  . In the past 14 days have you had any symptoms concerning for COVID-19 infection (fever, chills, cough, or new shortness of breath)? no . In the past 14 days have you been around anyone with known Covid 19? no   Reviewed procedure/mask/visitor instructions, COVID-19 questions reviewed with patient's daughter (DPR), [place].

## 2020-09-08 ENCOUNTER — Encounter (HOSPITAL_COMMUNITY): Payer: Self-pay | Admitting: Cardiovascular Disease

## 2020-09-08 ENCOUNTER — Ambulatory Visit (HOSPITAL_COMMUNITY)
Admission: RE | Admit: 2020-09-08 | Discharge: 2020-09-09 | Disposition: A | Discharge status: Home / Self Care | Payer: Medicare Other | Attending: Cardiovascular Disease | Admitting: Cardiovascular Disease

## 2020-09-08 ENCOUNTER — Other Ambulatory Visit: Payer: Self-pay

## 2020-09-08 ENCOUNTER — Ambulatory Visit (HOSPITAL_COMMUNITY)
Admission: RE | Disposition: A | Discharge status: Home / Self Care | Payer: Medicare Other | Source: Home / Self Care | Attending: Cardiovascular Disease

## 2020-09-08 DIAGNOSIS — I2510 Atherosclerotic heart disease of native coronary artery without angina pectoris: Secondary | ICD-10-CM | POA: Insufficient documentation

## 2020-09-08 DIAGNOSIS — Z79899 Other long term (current) drug therapy: Secondary | ICD-10-CM | POA: Insufficient documentation

## 2020-09-08 DIAGNOSIS — Z7982 Long term (current) use of aspirin: Secondary | ICD-10-CM | POA: Insufficient documentation

## 2020-09-08 DIAGNOSIS — Z955 Presence of coronary angioplasty implant and graft: Secondary | ICD-10-CM

## 2020-09-08 DIAGNOSIS — I350 Nonrheumatic aortic (valve) stenosis: Secondary | ICD-10-CM

## 2020-09-08 DIAGNOSIS — R0609 Other forms of dyspnea: Secondary | ICD-10-CM | POA: Diagnosis not present

## 2020-09-08 DIAGNOSIS — Z7984 Long term (current) use of oral hypoglycemic drugs: Secondary | ICD-10-CM | POA: Insufficient documentation

## 2020-09-08 HISTORY — PX: RIGHT/LEFT HEART CATH AND CORONARY ANGIOGRAPHY: CATH118266

## 2020-09-08 HISTORY — PX: CORONARY STENT INTERVENTION: CATH118234

## 2020-09-08 LAB — POCT I-STAT EG7
Acid-Base Excess: 1 mmol/L (ref 0.0–2.0)
Bicarbonate: 27 mmol/L (ref 20.0–28.0)
Calcium, Ion: 1.21 mmol/L (ref 1.15–1.40)
HCT: 31 % — ABNORMAL LOW (ref 36.0–46.0)
Hemoglobin: 10.5 g/dL — ABNORMAL LOW (ref 12.0–15.0)
O2 Saturation: 68 %
Potassium: 4.1 mmol/L (ref 3.5–5.1)
Sodium: 144 mmol/L (ref 135–145)
TCO2: 29 mmol/L (ref 22–32)
pCO2, Ven: 50.3 mmHg (ref 44.0–60.0)
pH, Ven: 7.338 (ref 7.250–7.430)
pO2, Ven: 38 mmHg (ref 32.0–45.0)

## 2020-09-08 LAB — POCT I-STAT 7, (LYTES, BLD GAS, ICA,H+H)
Acid-Base Excess: 0 mmol/L (ref 0.0–2.0)
Bicarbonate: 26.2 mmol/L (ref 20.0–28.0)
Calcium, Ion: 1.25 mmol/L (ref 1.15–1.40)
HCT: 32 % — ABNORMAL LOW (ref 36.0–46.0)
Hemoglobin: 10.9 g/dL — ABNORMAL LOW (ref 12.0–15.0)
O2 Saturation: 97 %
Potassium: 4.1 mmol/L (ref 3.5–5.1)
Sodium: 143 mmol/L (ref 135–145)
TCO2: 28 mmol/L (ref 22–32)
pCO2 arterial: 47 mmHg (ref 32.0–48.0)
pH, Arterial: 7.355 (ref 7.350–7.450)
pO2, Arterial: 93 mmHg (ref 83.0–108.0)

## 2020-09-08 LAB — GLUCOSE, CAPILLARY
Glucose-Capillary: 119 mg/dL — ABNORMAL HIGH (ref 70–99)
Glucose-Capillary: 101 mg/dL — ABNORMAL HIGH (ref 70–99)
Glucose-Capillary: 109 mg/dL — ABNORMAL HIGH (ref 70–99)
Glucose-Capillary: 125 mg/dL — ABNORMAL HIGH (ref 70–99)

## 2020-09-08 LAB — POCT ACTIVATED CLOTTING TIME: Activated Clotting Time: 273 seconds

## 2020-09-08 SURGERY — RIGHT/LEFT HEART CATH AND CORONARY ANGIOGRAPHY
Anesthesia: LOCAL

## 2020-09-08 MED ORDER — LIDOCAINE HCL (PF) 1 % IJ SOLN
INTRAMUSCULAR | Status: AC
  Filled 2020-09-08: qty 30

## 2020-09-08 MED ORDER — IOHEXOL 350 MG/ML SOLN
INTRAVENOUS | Status: DC | PRN
  Administered 2020-09-08: 85 mL

## 2020-09-08 MED ORDER — VERAPAMIL HCL 2.5 MG/ML IV SOLN
INTRAVENOUS | Status: AC
  Filled 2020-09-08: qty 2

## 2020-09-08 MED ORDER — VERAPAMIL HCL 2.5 MG/ML IV SOLN
INTRAVENOUS | Status: DC | PRN
  Administered 2020-09-08: 10 mL via INTRA_ARTERIAL

## 2020-09-08 MED ORDER — HEPARIN (PORCINE) IN NACL 1000-0.9 UT/500ML-% IV SOLN
INTRAVENOUS | Status: AC
  Filled 2020-09-08: qty 1000

## 2020-09-08 MED ORDER — SODIUM CHLORIDE 0.9 % IV SOLN
INTRAVENOUS | Status: AC | PRN
  Administered 2020-09-08: 4 ug/kg/min via INTRAVENOUS

## 2020-09-08 MED ORDER — ACETAMINOPHEN 325 MG PO TABS
650.0000 mg | ORAL_TABLET | ORAL | Status: DC | PRN
  Administered 2020-09-08: 650 mg via ORAL
  Filled 2020-09-08: qty 2

## 2020-09-08 MED ORDER — SODIUM CHLORIDE 0.9 % WEIGHT BASED INFUSION
3.0000 mL/kg/h | INTRAVENOUS | Status: DC
  Administered 2020-09-08: 3 mL/kg/h via INTRAVENOUS

## 2020-09-08 MED ORDER — ASPIRIN 81 MG PO CHEW: 81.0000 mg | CHEWABLE_TABLET | ORAL | Status: DC

## 2020-09-08 MED ORDER — PANTOPRAZOLE SODIUM 40 MG PO TBEC
40.0000 mg | DELAYED_RELEASE_TABLET | Freq: Every day | ORAL | Status: DC
  Administered 2020-09-08 – 2020-09-09 (×2): 40 mg via ORAL
  Filled 2020-09-08 (×2): qty 1

## 2020-09-08 MED ORDER — METOPROLOL TARTRATE 12.5 MG HALF TABLET
12.5000 mg | ORAL_TABLET | Freq: Two times a day (BID) | ORAL | Status: DC
  Administered 2020-09-08 – 2020-09-09 (×2): 12.5 mg via ORAL
  Filled 2020-09-08 (×2): qty 1

## 2020-09-08 MED ORDER — MECLIZINE HCL 25 MG PO TABS: 12.5000 mg | ORAL_TABLET | Freq: Three times a day (TID) | ORAL | Status: DC | PRN

## 2020-09-08 MED ORDER — ONDANSETRON HCL 4 MG/2ML IJ SOLN
INTRAMUSCULAR | Status: AC
  Filled 2020-09-08: qty 2

## 2020-09-08 MED ORDER — ONDANSETRON HCL 4 MG/2ML IJ SOLN
4.0000 mg | Freq: Four times a day (QID) | INTRAMUSCULAR | Status: DC | PRN
  Administered 2020-09-08: 4 mg via INTRAVENOUS

## 2020-09-08 MED ORDER — CLOPIDOGREL BISULFATE 300 MG PO TABS
600.0000 mg | ORAL_TABLET | Freq: Once | ORAL | Status: AC
  Administered 2020-09-08: 600 mg via ORAL

## 2020-09-08 MED ORDER — CANGRELOR TETRASODIUM 50 MG IV SOLR
INTRAVENOUS | Status: AC
  Filled 2020-09-08: qty 50

## 2020-09-08 MED ORDER — SODIUM CHLORIDE 0.9% FLUSH
3.0000 mL | Freq: Two times a day (BID) | INTRAVENOUS | Status: DC
  Administered 2020-09-08 – 2020-09-09 (×2): 3 mL via INTRAVENOUS

## 2020-09-08 MED ORDER — SODIUM CHLORIDE 0.9 % IV SOLN: 4.0000 ug/kg/min | INTRAVENOUS | Status: DC

## 2020-09-08 MED ORDER — SODIUM CHLORIDE 0.9 % IV SOLN: 250.0000 mL | INTRAVENOUS | Status: DC | PRN

## 2020-09-08 MED ORDER — FENTANYL CITRATE (PF) 100 MCG/2ML IJ SOLN
INTRAMUSCULAR | Status: DC | PRN
  Administered 2020-09-08 (×2): 25 ug via INTRAVENOUS

## 2020-09-08 MED ORDER — LABETALOL HCL 5 MG/ML IV SOLN: 10.0000 mg | INTRAVENOUS | Status: AC | PRN

## 2020-09-08 MED ORDER — HEPARIN SODIUM (PORCINE) 1000 UNIT/ML IJ SOLN
INTRAMUSCULAR | Status: AC
  Filled 2020-09-08: qty 1

## 2020-09-08 MED ORDER — FENTANYL CITRATE (PF) 100 MCG/2ML IJ SOLN
INTRAMUSCULAR | Status: AC
  Filled 2020-09-08: qty 2

## 2020-09-08 MED ORDER — SODIUM CHLORIDE 0.9% FLUSH: 3.0000 mL | INTRAVENOUS | Status: DC | PRN

## 2020-09-08 MED ORDER — HYDRALAZINE HCL 20 MG/ML IJ SOLN: 10.0000 mg | INTRAMUSCULAR | Status: AC | PRN

## 2020-09-08 MED ORDER — MIDAZOLAM HCL 2 MG/2ML IJ SOLN
INTRAMUSCULAR | Status: AC
  Filled 2020-09-08: qty 2

## 2020-09-08 MED ORDER — ATORVASTATIN CALCIUM 40 MG PO TABS
40.0000 mg | ORAL_TABLET | Freq: Every day | ORAL | Status: DC
  Administered 2020-09-08: 40 mg via ORAL
  Filled 2020-09-08: qty 1

## 2020-09-08 MED ORDER — SODIUM CHLORIDE 0.9 % WEIGHT BASED INFUSION: 1.0000 mL/kg/h | INTRAVENOUS | Status: DC

## 2020-09-08 MED ORDER — SODIUM CHLORIDE 0.9 % IV SOLN: INTRAVENOUS | Status: AC

## 2020-09-08 MED ORDER — LIDOCAINE HCL (PF) 1 % IJ SOLN
INTRAMUSCULAR | Status: DC | PRN
  Administered 2020-09-08 (×2): 2 mL

## 2020-09-08 MED ORDER — GLIMEPIRIDE 1 MG PO TABS
1.0000 mg | ORAL_TABLET | Freq: Every day | ORAL | Status: DC
  Administered 2020-09-09: 1 mg via ORAL
  Filled 2020-09-08: qty 1

## 2020-09-08 MED ORDER — HEPARIN (PORCINE) IN NACL 1000-0.9 UT/500ML-% IV SOLN
INTRAVENOUS | Status: DC | PRN
  Administered 2020-09-08 (×2): 500 mL

## 2020-09-08 MED ORDER — HEPARIN SODIUM (PORCINE) 1000 UNIT/ML IJ SOLN
INTRAMUSCULAR | Status: DC | PRN
  Administered 2020-09-08: 3000 [IU] via INTRAVENOUS
  Administered 2020-09-08: 4000 [IU] via INTRAVENOUS

## 2020-09-08 MED ORDER — ASPIRIN 81 MG PO CHEW
81.0000 mg | CHEWABLE_TABLET | Freq: Every day | ORAL | Status: DC
  Administered 2020-09-09: 81 mg via ORAL
  Filled 2020-09-08: qty 1

## 2020-09-08 MED ORDER — NITROGLYCERIN 1 MG/10 ML FOR IR/CATH LAB
INTRA_ARTERIAL | Status: AC
  Filled 2020-09-08: qty 10

## 2020-09-08 MED ORDER — ALPRAZOLAM 0.5 MG PO TABS
0.5000 mg | ORAL_TABLET | Freq: Every evening | ORAL | Status: DC | PRN
  Administered 2020-09-08: 0.5 mg via ORAL
  Filled 2020-09-08: qty 1

## 2020-09-08 MED ORDER — CLOPIDOGREL BISULFATE 75 MG PO TABS
75.0000 mg | ORAL_TABLET | Freq: Every day | ORAL | Status: DC
  Administered 2020-09-09: 75 mg via ORAL
  Filled 2020-09-08: qty 1

## 2020-09-08 MED ORDER — SODIUM CHLORIDE 0.9% FLUSH
3.0000 mL | Freq: Two times a day (BID) | INTRAVENOUS | Status: DC
  Administered 2020-09-09: 3 mL via INTRAVENOUS

## 2020-09-08 MED ORDER — MIDAZOLAM HCL 2 MG/2ML IJ SOLN
INTRAMUSCULAR | Status: DC | PRN
  Administered 2020-09-08 (×2): 1 mg via INTRAVENOUS

## 2020-09-08 MED ORDER — CLOPIDOGREL BISULFATE 300 MG PO TABS
ORAL_TABLET | ORAL | Status: AC
  Filled 2020-09-08: qty 2

## 2020-09-08 MED ORDER — CANGRELOR BOLUS VIA INFUSION
INTRAVENOUS | Status: DC | PRN
  Administered 2020-09-08: 2313 ug via INTRAVENOUS

## 2020-09-08 SURGICAL SUPPLY — 22 items
BALLN SAPPHIRE 2.5X12 (BALLOONS) ×2
BALLN SAPPHIRE ~~LOC~~ 3.75X10 (BALLOONS) ×1 IMPLANT
BALLOON SAPPHIRE 2.5X12 (BALLOONS) IMPLANT
CATH 5FR JL3.5 JR4 ANG PIG MP (CATHETERS) ×1 IMPLANT
CATH BALLN WEDGE 5F 110CM (CATHETERS) ×1 IMPLANT
CATH INFINITI 5FR AL1 (CATHETERS) ×1 IMPLANT
CATH LAUNCHER 6FR EBU3.5 (CATHETERS) ×1 IMPLANT
DEVICE RAD COMP TR BAND LRG (VASCULAR PRODUCTS) ×1 IMPLANT
GLIDESHEATH SLEND SS 6F .021 (SHEATH) ×1 IMPLANT
GUIDEWIRE INQWIRE 1.5J.035X260 (WIRE) IMPLANT
INQWIRE 1.5J .035X260CM (WIRE) ×2
KIT ENCORE 26 ADVANTAGE (KITS) ×1 IMPLANT
KIT HEART LEFT (KITS) ×2 IMPLANT
KIT HEMO VALVE WATCHDOG (MISCELLANEOUS) ×1 IMPLANT
PACK CARDIAC CATHETERIZATION (CUSTOM PROCEDURE TRAY) ×2 IMPLANT
SHEATH GLIDE SLENDER 4/5FR (SHEATH) ×1 IMPLANT
SHEATH PROBE COVER 6X72 (BAG) ×1 IMPLANT
STENT RESOLUTE ONYX 3.5X12 (Permanent Stent) ×1 IMPLANT
TRANSDUCER W/STOPCOCK (MISCELLANEOUS) ×2 IMPLANT
TUBING CIL FLEX 10 FLL-RA (TUBING) ×2 IMPLANT
WIRE COUGAR XT STRL 190CM (WIRE) ×1 IMPLANT
WIRE EMERALD ST .035X260CM (WIRE) ×1 IMPLANT

## 2020-09-08 NOTE — Interval H&P Note (Signed)
History and Physical Interval Note:  09/08/2020 10:51 AM  Denise Parrish  has presented today for surgery, with the diagnosis of aortic stenosis.  The various methods of treatment have been discussed with the patient and family. After consideration of risks, benefits and other options for treatment, the patient has consented to  Procedure(s): RIGHT/LEFT HEART CATH AND CORONARY ANGIOGRAPHY (N/A) as a surgical intervention.  The patient's history has been reviewed, patient examined, no change in status, stable for surgery.  I have reviewed the patient's chart and labs.  Questions were answered to the patient's satisfaction.     Sherren Mocha

## 2020-09-08 NOTE — Progress Notes (Signed)
TR band 0cc for 1 hour. TR band removed from right radial and pressure dressing applied. Dressing transparent. No bleeding or s/s of infection. Will continue to monitor.

## 2020-09-08 NOTE — Progress Notes (Signed)
VAST consulted to start 2nd IV in right arm (20g) for cardiac cath. Upon arrival at bedside, patient had already been taken for procedure.

## 2020-09-08 NOTE — Plan of Care (Signed)

## 2020-09-09 ENCOUNTER — Encounter (HOSPITAL_COMMUNITY): Payer: Self-pay | Admitting: Cardiovascular Disease

## 2020-09-09 DIAGNOSIS — I350 Nonrheumatic aortic (valve) stenosis: Secondary | ICD-10-CM | POA: Diagnosis not present

## 2020-09-09 DIAGNOSIS — I2510 Atherosclerotic heart disease of native coronary artery without angina pectoris: Secondary | ICD-10-CM | POA: Diagnosis not present

## 2020-09-09 DIAGNOSIS — R0609 Other forms of dyspnea: Secondary | ICD-10-CM | POA: Diagnosis not present

## 2020-09-09 DIAGNOSIS — Z7982 Long term (current) use of aspirin: Secondary | ICD-10-CM | POA: Diagnosis not present

## 2020-09-09 LAB — CBC
HCT: 28.6 % — ABNORMAL LOW (ref 36.0–46.0)
Hemoglobin: 9.6 g/dL — ABNORMAL LOW (ref 12.0–15.0)
MCH: 29.2 pg (ref 26.0–34.0)
MCHC: 33.6 g/dL (ref 30.0–36.0)
MCV: 86.9 fL (ref 80.0–100.0)
Platelets: 189 10*3/uL (ref 150–400)
RBC: 3.29 MIL/uL — ABNORMAL LOW (ref 3.87–5.11)
RDW: 16.1 % — ABNORMAL HIGH (ref 11.5–15.5)
WBC: 6.3 10*3/uL (ref 4.0–10.5)
nRBC: 0 % (ref 0.0–0.2)

## 2020-09-09 LAB — BASIC METABOLIC PANEL
Anion gap: 9 (ref 5–15)
BUN: 13 mg/dL (ref 8–23)
CO2: 24 mmol/L (ref 22–32)
Calcium: 8.3 mg/dL — ABNORMAL LOW (ref 8.9–10.3)
Chloride: 105 mmol/L (ref 98–111)
Creatinine, Ser: 0.95 mg/dL (ref 0.44–1.00)
GFR, Estimated: 55 mL/min — ABNORMAL LOW (ref >60)
Glucose, Bld: 99 mg/dL (ref 70–99)
Potassium: 3.9 mmol/L (ref 3.5–5.1)
Sodium: 138 mmol/L (ref 135–145)

## 2020-09-09 LAB — GLUCOSE, CAPILLARY: Glucose-Capillary: 148 mg/dL — ABNORMAL HIGH (ref 70–99)

## 2020-09-09 MED ORDER — CLOPIDOGREL BISULFATE 75 MG PO TABS: 75.0000 mg | ORAL_TABLET | Freq: Every day | ORAL | 3 refills | Status: DC

## 2020-09-09 MED ORDER — PANTOPRAZOLE SODIUM 40 MG PO TBEC: 40.0000 mg | DELAYED_RELEASE_TABLET | Freq: Every day | ORAL | 3 refills | Status: DC

## 2020-09-09 MED FILL — PANTOPRAZOLE SOD DR 40 MG T: 40 | 90 days supply | Qty: 90 | Fill #0

## 2020-09-09 MED FILL — CLOPIDOGREL 75 MG TABLET: 75 | 90 days supply | Qty: 90 | Fill #0

## 2020-09-09 MED FILL — Nitroglycerin IV Soln 100 MCG/ML in D5W: INTRA_ARTERIAL | Qty: 10 | Status: AC

## 2020-09-09 NOTE — Progress Notes (Signed)
EKG completed and placed on pts chart.

## 2020-09-09 NOTE — Discharge Summary (Addendum)
Discharge Summary    Patient ID: KIAIRA POINTER MRN: [id number]; DOB: [birth year]  Admit date: 09/08/2020 Discharge date: 09/09/2020  Primary Care Provider: [name], MD  Primary Cardiologist: Jenkins Rouge, MD  Primary Electrophysiologist:  None   Discharge Diagnoses    Active Problems:   Severe aortic stenosis    Diagnostic Studies/Procedures    Cath: 09/08/20  1.  Severe 90% left circumflex stenosis (large caliber vessel) with successful PCI using a 3.5 x 12 mm resolute Onyx DES 2.  Moderate to severe distal RCA stenosis of 60 to 70%, recommend medical therapy 3.  Mild nonobstructive LAD stenosis 4.  Severe aortic stenosis with mean gradient 57 mmHg and aortic valve area 0.78 cm   Recommendations: Dual antiplatelet therapy with aspirin and clopidogrel x6 months.  Continue Cangrelor infusion x2 hours then loaded with clopidogrel 600 mg x 1.  Overnight observation in this elderly patient with severe aortic stenosis post PCI.  Continue outpatient TAVR evaluation.  Diagnostic Dominance: Right    Intervention     _____________   History of Present Illness     Aanika P Sabella is a 86 y.o. female with PMH of aortic stenosis, referred to Dr. Burt Knack by Dr Johnsie Cancel and Levell July, NP.   The patient has known of a heart murmur for many decades. She has been followed by Dr Johnsie Cancel regularly and has been noted to have moderate-severe aortic stenosis over most recent echo studies.    She has tried to stay active as much as she can. She loves going to [employer], and went to 6 games this past year. Her kids bought her the MLB channel this year because she loves watching baseball. The year prior she was able to walk into the stadium, but this year she had to use a wheelchair. 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    She had a complex hospitalization in August of this year with GI  bleed, demand ischemia, and mechanical fall in the hospital with a fracture of the left fibula.  She had a slow recovery from this, but was essentially back to her baseline now.  The patient had a stroke in January of this year and was found to have symptomatic right carotid stenosis.  She was treated with right carotid endarterectomy at that time.   She is now primarily limited by shortness of breath. This has been present for at least a few years, but it has worsened over the past 6 months. She is now short of breath with low-level activity such as walking short distances on level ground.  She denies chest pain or pressure with physical activity.  She denies orthopnea or PND.  She denies leg swelling.  No lightheadedness or syncope. She was evaluated by the structural heart team and felt to be appropriate for TAVR.   Hospital Course     Underwent cardiac cath noted above severe 90% LCx stenosis treated with PCI/DES x1. Does have residual moderate to severe dRCA stenosis of 60-70% with recommendations for medical therapy. Placed initially on DAPT with ASA/plavix, but decision made for plavix only with recent hx of upper GIB. No complications noted overnight. Worked well with cardiac rehab without recurrent chest pain. Continued on home medications without other significant changes.  Did the patient have an acute coronary syndrome (MI, NSTEMI, STEMI, etc) this admission?:  Yes  AHA/ACC Clinical Performance & Quality Measures: Aspirin prescribed? - No- per Dr. Burt Knack only planned for plavix with recent hx of UGIB ADP Receptor Inhibitor (Plavix/Clopidogrel, Brilinta/Ticagrelor or Effient/Prasugrel) prescribed (includes medically managed patients)? - Yes Beta Blocker prescribed? - Yes High Intensity Statin (Lipitor 40-68m or Crestor 20-486m prescribed? - Yes EF assessed during THIS hospitalization? - Yes For EF <40%, was ACEI/ARB prescribed? - Not Applicable (EF >/=  4011%For EF <40%, Aldosterone Antagonist (Spironolactone or Eplerenone) prescribed? - Not Applicable (EF >/= 4015%Cardiac Rehab Phase II ordered (including medically managed patients)? - Yes   _____________  Discharge Vitals Blood pressure (!) 165/67, pulse 66, temperature 98.3 F (36.8 C), temperature source Oral, resp. rate 16, height 5' 5"  (1.651 m), weight 76.8 kg, SpO2 91 %.  Filed Weights   09/08/20 0957 09/09/20 0530  Weight: 77.1 kg 76.8 kg    Labs & Radiologic Studies    CBC Recent Labs    09/08/20 1109 09/09/20 0319  WBC  --  6.3  HGB 10.9* 9.6*  HCT 32.0* 28.6*  MCV  --  86.9  PLT  --  18520 Basic Metabolic Panel Recent Labs    09/08/20 1109 09/09/20 0319  NA 143 138  K 4.1 3.9  CL  --  105  CO2  --  24  GLUCOSE  --  99  BUN  --  13  CREATININE  --  0.95  CALCIUM  --  8.3*   Liver Function Tests No results for input(s): AST, ALT, ALKPHOS, BILITOT, PROT, ALBUMIN in the last 72 hours. No results for input(s): LIPASE, AMYLASE in the last 72 hours. High Sensitivity Troponin:   No results for input(s): TROPONINIHS in the last 720 hours.  BNP Invalid input(s): POCBNP D-Dimer No results for input(s): DDIMER in the last 72 hours. Hemoglobin A1C No results for input(s): HGBA1C in the last 72 hours. Fasting Lipid Panel No results for input(s): CHOL, HDL, LDLCALC, TRIG, CHOLHDL, LDLDIRECT in the last 72 hours. Thyroid Function Tests No results for input(s): TSH, T4TOTAL, T3FREE, THYROIDAB in the last 72 hours.  Invalid input(s): FREET3 _____________  CARDIAC CATHETERIZATION  Result Date: 09/08/2020 1.  Severe 90% left circumflex stenosis (large caliber vessel) with successful PCI using a 3.5 x 12 mm resolute Onyx DES 2.  Moderate to severe distal RCA stenosis of 60 to 70%, recommend medical therapy 3.  Mild nonobstructive LAD stenosis 4.  Severe aortic stenosis with mean gradient 57 mmHg and aortic valve area 0.78 cm Recommendations: Dual antiplatelet  therapy with aspirin and clopidogrel x6 months.  Continue Cangrelor infusion x2 hours then loaded with clopidogrel 600 mg x 1.  Overnight observation in this elderly patient with severe aortic stenosis post PCI.  Continue outpatient TAVR evaluation.  LONG TERM MONITOR (3-14 DAYS)  Result Date: 09/03/2020 NSR Periods of wenckebach Short period 9 beats atrial tachycardia No sustained arrhythmias   Disposition   Pt is being discharged home today in good condition.  Follow-up Plans & Appointments      Discharge Instructions     Amb Referral to Cardiac Rehabilitation   Complete by: As directed    Diagnosis: Coronary Stents   After initial evaluation and assessments completed: Virtual Based Care may be provided alone or in conjunction with Phase 2 Cardiac Rehab based on patient barriers.: Yes   Call MD for:  difficulty breathing, headache or visual disturbances   Complete by: As directed    Call MD for:  redness, tenderness, or signs  of infection (pain, swelling, redness, odor or green/yellow discharge around incision site)   Complete by: As directed    Diet - low sodium heart healthy   Complete by: As directed    Discharge instructions   Complete by: As directed    Radial Site Care Refer to this sheet in the next few weeks. These instructions provide you with information on caring for yourself after your procedure. Your caregiver may also give you more specific instructions. Your treatment has been planned according to current medical practices, but problems sometimes occur. Call your caregiver if you have any problems or questions after your procedure. HOME CARE INSTRUCTIONS You may shower the day after the procedure. Remove the bandage (dressing) and gently wash the site with plain soap and water. Gently pat the site dry.  Do not apply powder or lotion to the site.  Do not submerge the affected site in water for 3 to 5 days.  Inspect the site at least twice daily.  Do not flex or  bend the affected arm for 24 hours.  No lifting over 5 pounds (2.3 kg) for 5 days after your procedure.  Do not drive home if you are discharged the same day of the procedure. Have someone else drive you.  You may drive 24 hours after the procedure unless otherwise instructed by your caregiver.  What to expect: Any bruising will usually fade within 1 to 2 weeks.  Blood that collects in the tissue (hematoma) may be painful to the touch. It should usually decrease in size and tenderness within 1 to 2 weeks.  SEEK IMMEDIATE MEDICAL CARE IF: You have unusual pain at the radial site.  You have redness, warmth, swelling, or pain at the radial site.  You have drainage (other than a small amount of blood on the dressing).  You have chills.  You have a fever or persistent symptoms for more than 72 hours.  You have a fever and your symptoms suddenly get worse.  Your arm becomes pale, cool, tingly, or numb.  You have heavy bleeding from the site. Hold pressure on the site.   PLEASE DO NOT MISS ANY DOSES OF YOUR PLAVIX!!!!! Also keep a log of you blood pressures and bring back to your follow up appt. Please call the office with any questions.   Patients taking blood thinners should generally stay away from medicines like ibuprofen, Advil, Motrin, naproxen, and Aleve due to risk of stomach bleeding. You may take Tylenol as directed or talk to your primary doctor about alternatives.  Some studies suggest Prilosec/Omeprazole interacts with Plavix. We changed your Prilosec/Omeprazole to the equivalent dose of Protonix for less chance of interaction.  PLEASE ENSURE THAT YOU DO NOT RUN OUT OF YOUR PLAVIX. This medication is very important to remain on for at least one year. IF you have issues obtaining this medication due to cost please CALL the office 3-5 business days prior to running out in order to prevent missing doses of this medication.   Increase activity slowly   Complete by: As directed         Discharge Medications   Allergies as of 09/09/2020   No Known Allergies      Medication List     STOP taking these medications    aspirin 81 MG chewable tablet   omeprazole 20 MG capsule Commonly known as: PRILOSEC Replaced by: pantoprazole 40 MG tablet       TAKE these medications    ALPRAZolam 0.5 MG  tablet Commonly known as: XANAX Take 0.5 mg by mouth at bedtime as needed for anxiety.   atorvastatin 40 MG tablet Commonly known as: LIPITOR Take 1 tablet (40 mg total) by mouth daily at 6 PM.   cholecalciferol 25 MCG (1000 UNIT) tablet Commonly known as: VITAMIN D Take 1,000 Units by mouth daily.   clopidogrel 75 MG tablet Commonly known as: PLAVIX Take 1 tablet (75 mg total) by mouth daily with breakfast.   glimepiride 1 MG tablet Commonly known as: AMARYL Take 1 mg by mouth daily with breakfast.   LUTEIN PO Take by mouth daily.   meclizine 12.5 MG tablet Commonly known as: ANTIVERT Take 1 tablet (12.5 mg total) by mouth 3 (three) times daily as needed for dizziness.   metoprolol tartrate 25 MG tablet Commonly known as: LOPRESSOR Take 0.5 tablets (12.5 mg total) by mouth 2 (two) times daily.   pantoprazole 40 MG tablet Commonly known as: PROTONIX Take 1 tablet (40 mg total) by mouth daily. Replaces: omeprazole 20 MG capsule   PRESERVISION AREDS 2 PO Take 1 tablet by mouth See admin instructions. Takes 1 tablet in the morning and 1 tablet at night for eyes.   VISINE DRY EYE OP Apply 1 drop to eye 3 (three) times daily as needed (for dry eye relief).   VITAMIN C PO Take 1 tablet by mouth daily.   vitamin E 180 MG (400 UNITS) capsule Take 400 Units by mouth daily.          Outstanding Labs/Studies   N/a   Duration of Discharge Encounter   Greater than 30 minutes including physician time.  Signed, Reino Bellis, NP 09/09/2020, 10:29 AM  Agree with note by Reino Bellis NP-C  86 year old female omitted for TAVR work-up.   She had right left heart cath performed by Dr. Burt Knack yesterday.  She had a moderate distal RCA lesion and high-grade mid AV groove circumflex lesion underwent PCI drug-eluting stenting uneventfully.  Her radial puncture site is stable.  She is asymptomatic this morning.  She can be discharged home and continue her TAVR work-up per Dr. Antionette Char protocol.  Lorretta Harp, M.D., [place], [facility], [place] [address] [place], [place]  [address]  [phone] 09/09/2020 11:08 AM

## 2020-09-09 NOTE — Progress Notes (Signed)
CARDIAC REHAB PHASE I   PRE:  Rate/Rhythm: 67 SR  BP:  Supine: 112/71  Sitting:   Standing:    SaO2: 98%RA  MODE:  Ambulation: 150 ft   POST:  Rate/Rhythm: 114 ST  BP:  Supine: 165/67  Sitting:   Standing:    SaO2: 97%RA [id number] Assisted pt to bathroom and then with mesh panty and pad. Took away purewick. Pt knows to call if she needs to go to bathroom. Bed alarm on after walk. Pt walked 150 ft on RA with gait belt use and holding her hand. Pt did not want to use walker. Pt encouraged to use walker at home until stronger and to have family stay with her a few days. Pt stated she has BSC, walker,cane and wheelchair at home. Discussed importance of plavix with stent. Reviewed NTG use and encouraged to call 911 if CP. Discussed watching sodium but did not give diets. Pt stated she eats what she likes. Referral to [facility] to meet protocol. Pt does not drive and is for tentative TAVR in future. Not appropriate for program at this time. Pt voiced understanding of ed. No c/o CP with walk.   Graylon Good, RN BSN  09/09/2020 9:26 AM

## 2020-09-20 ENCOUNTER — Encounter: Payer: Self-pay | Admitting: Physician Assistant

## 2020-09-21 ENCOUNTER — Ambulatory Visit: Payer: Medicare Other | Attending: Cardiovascular Disease | Admitting: Physical Therapy

## 2020-09-21 ENCOUNTER — Other Ambulatory Visit: Payer: Self-pay

## 2020-09-21 ENCOUNTER — Ambulatory Visit (HOSPITAL_COMMUNITY)
Admission: RE | Admit: 2020-09-21 | Discharge: 2020-09-21 | Disposition: A | Discharge status: Home / Self Care | Payer: Medicare Other | Source: Ambulatory Visit | Attending: Cardiovascular Disease | Admitting: Cardiovascular Disease

## 2020-09-21 ENCOUNTER — Encounter (HOSPITAL_COMMUNITY): Payer: Self-pay

## 2020-09-21 DIAGNOSIS — R2689 Other abnormalities of gait and mobility: Secondary | ICD-10-CM

## 2020-09-21 DIAGNOSIS — M6281 Muscle weakness (generalized): Secondary | ICD-10-CM | POA: Diagnosis present

## 2020-09-21 DIAGNOSIS — I350 Nonrheumatic aortic (valve) stenosis: Secondary | ICD-10-CM

## 2020-09-21 MED ORDER — IOHEXOL 350 MG/ML SOLN
100.0000 mL | Freq: Once | INTRAVENOUS | Status: AC | PRN
  Administered 2020-09-21: 100 mL via INTRAVENOUS

## 2020-09-21 NOTE — Therapy (Signed)
[place], [place], [address] Phone: [phone]   Fax:  [phone]  Physical Therapy Evaluation  Patient Details  Name: Denise Parrish MRN: [id number] Date of Birth: [birth year] Referring Provider (PT): Sherren Mocha MD   Encounter Date: 09/21/2020   PT End of Session - 09/21/20 1326    Visit Number 1    Number of Visits 1    Date for PT Re-Evaluation 09/22/20    PT Start Time 1448    PT Stop Time 1405    PT Time Calculation (min) 38 min    Activity Tolerance Patient tolerated treatment well    Behavior During Therapy [facility] for tasks assessed/performed           Past Medical History:  Diagnosis Date  . Anxiety   . Carotid stenosis, right 10/16/2019   75% on CTA neck  . Cholelithiasis    on u/s 09/2011  . Degenerative joint disease    Right THA in 1995; cervical discectomy and fusion-2001  . Diabetes mellitus, type 2 (HCC)    + neuropathy  . Emphysema   . Gastroesophageal reflux disease   . Hiatal hernia   . Hyperlipidemia   . Hypertension   . IDA (iron deficiency anemia)    labs 09/2011  . Mild cognitive impairment   . Obesity   . Osteoporosis   . Pneumonia   . Severe aortic stenosis   . Sigmoid diverticulosis   . Small bowel lesion    On Given's capsule; Dr Kathleene Hazel 07/2012, no further w/u needed  . Stroke [facility])     Past Surgical History:  Procedure Laterality Date  . ANTERIOR CERVICAL DISCECTOMY  2001   C4-5, allograft, fixation  . BREAST LUMPECTOMY     benign  . CATARACT EXTRACTION W/ INTRAOCULAR LENS IMPLANT  2007   Left  . COLONOSCOPY  2006  . COLONOSCOPY  04/03/12   Dr. Ok Edwards diverticulosis, negative microscopic  colitis   . CORONARY STENT INTERVENTION N/A 09/08/2020   Procedure: CORONARY STENT INTERVENTION;  Surgeon: Sherren Mocha, MD;  Location: [place] [facility];  Service: Cardiovascular;  Laterality: N/A;  . ENDARTERECTOMY Right 10/21/2019    Procedure: ENDARTERECTOMY CAROTID RIGHT;  Surgeon: Waynetta Sandy, MD;  Location: [place];  Service: Vascular;  Laterality: Right;  . ESOPHAGOGASTRODUODENOSCOPY  04/03/12   Dr. Jennet Maduro hernia, chronic gastritis on bx  . ESOPHAGOGASTRODUODENOSCOPY (EGD) WITH PROPOFOL N/A 04/28/2020   Procedure: ESOPHAGOGASTRODUODENOSCOPY (EGD) WITH PROPOFOL;  Surgeon: Otis Brace, MD;  Location: [place];  Service: Gastroenterology;  Laterality: N/A;  . GIVENS CAPSULE STUDY  05/09/2012   RMR: an unclear raised area of small bowel was noted, with features almost  characteristic of very small polyp. This was without villous  blunting or any evidence of active bleeding; yet, the area of  concern appeared to be erythematous. However, this could simply  be a light reflection on a normal variation of the small bowel. REFERRED TO DR. GILLIAM, APPT FOR NOVEMBER 2013.   [name] GIVENS CAPSULE STUDY N/A 04/30/2020   Procedure: GIVENS CAPSULE STUDY;  Surgeon: Otis Brace, MD;  Location: [place];  Service: Gastroenterology;  Laterality: N/A;  . HIP ARTHROPLASTY Right   . PATCH ANGIOPLASTY Right 10/21/2019   Procedure: PATCH ANGIOPLASTY USING Rueben Bash BIOLOGIC PATCH;  Surgeon: Waynetta Sandy, MD;  Location: [place];  Service: Vascular;  Laterality: Right;  . POLYPECTOMY  04/28/2020   Procedure: POLYPECTOMY;  Surgeon: Otis Brace, MD;  Location:  [facility];  Service: Gastroenterology;;  . RIGHT/LEFT HEART CATH AND CORONARY ANGIOGRAPHY N/A 09/08/2020   Procedure: RIGHT/LEFT HEART CATH AND CORONARY ANGIOGRAPHY;  Surgeon: Sherren Mocha, MD;  Location: [place] [facility];  Service: Cardiovascular;  Laterality: N/A;  . TONSILLECTOMY    . TOTAL HIP ARTHROPLASTY  1995   Right    There were no vitals filed for this visit.    Subjective Assessment - 09/21/20 1333    Subjective pt is a 87 y.o F with CC SOB, chest pain, and general fatigue for about 2 years and is worsening since onset. She  had a catheterization in December and reported she had 90% blockage. pt denies an ypain or issues    Patient Stated Goals to fix heart    Currently in Pain? No/denies              [facility] PT Assessment - 09/21/20 0001      Assessment   Medical Diagnosis Severe Aortic Stenosis    Referring Provider (PT) Sherren Mocha MD    Onset Date/Surgical Date --   2years   Hand Dominance Right      Precautions   Precautions None      Restrictions   Weight Bearing Restrictions No      Balance Screen   Has the patient fallen in the past 6 months Yes    How many times? 3    Has the patient had a decrease in activity level because of a fear of falling?  No    Is the patient reluctant to leave their home because of a fear of falling?  No      Home Environment   Living Environment Private residence    Living Arrangements Alone    Type of [place] to enter    Entrance Stairs-Number of Steps 5    Entrance Stairs-Rails Can reach both    [place] One level    [place] - single point;Bedside commode;Grab bars - toilet;Shower seat;Wheelchair - manual   rollator     ROM / Strength   AROM / PROM / Strength AROM;Strength      AROM   Overall AROM  Within functional limits for tasks performed      Strength   Overall Strength Comments mild weakness noted with hip flexion at 3+/5    Strength Assessment Site Hand    Right Hand Grip (lbs) 15    Left Hand Grip (lbs) 20      Ambulation/Gait   Ambulation/Gait Yes    Assistive device None   arrived to appointment with [facility] but reports she generally amb without any AD.   Gait Pattern Step-through pattern;Decreased stride length;Shuffle;Decreased trunk rotation;Trunk flexed    Gait Comments pt ambulated 185 ft in 1:30 requiring rest break lasting 1:45 HR 101 and O2 95%, she resumed walking 185 ft  requiring rest break for remainder of test HR 103 and O2 95%.            [facility] Pre-Surgical Assessment - 09/21/20  0001    5 Meter Walk Test- trial 1 7 sec    5 Meter Walk Test- trial 2 6 sec.     5 Meter Walk Test- trial 3 6 sec.    5 meter walk test average 6.33 sec    4 Stage Balance Test tolerated for:  5 sec.    4 Stage Balance Test Position 3    Sit To Stand  Test- trial 1 --   unable to perform   ADL/IADL Independent with: Bathing;Dressing    ADL/IADL Needs Assistance with: Meal prep;Finances;Yard work    ADL/IADL Therapist, sports Index Moderately frail    6 Minute Walk- Baseline yes    BP (mmHg) 128/56    HR (bpm) 63    02 Sat (%RA) 98 %    Modified Borg Scale for Dyspnea 0- Nothing at all    Perceived Rate of Exertion (Borg) 9- very light    6 Minute Walk Post Test yes    BP (mmHg) 159/59    HR (bpm) 103    02 Sat (%RA) 95 %    Modified Borg Scale for Dyspnea 1- Very mild shortness of breath    Perceived Rate of Exertion (Borg) 13- Somewhat hard    Aerobic Endurance Distance Walked 370    Endurance additional comments unable to calculate limitation per age related norm, but for 80-89 is 1286 ft                    Objective measurements completed on examination: See above findings.               PT Education - 09/21/20 1410    Education Details reviewed Frailty score and to take time with walking and navigating turns and to lead with feet versus turning upper body then stepping which can result in tripping.    Person(s) Educated Patient;Child(ren)    Methods Explanation    Comprehension Verbalized understanding                       Plan - 09/21/20 1411    Clinical Impression Statement see assessment in note    Stability/Clinical Decision Making Stable/Uncomplicated    Clinical Decision Making Low    PT Frequency One time visit    PT Next Visit Plan Pre-TAVR evaluation    Consulted and Agree with Plan of Care Patient;Family member/caregiver    Family Member Consulted daughter          Clinical Impression Statement: Pt is a 87 yo F presenting to  OP PT for evaluation prior to possible TAVR surgery due to severe aortic stenosis. Pt reports onset of SOB, chest pain and general fatigue approximately 2 years ago. Symptoms are limiting endurance . Pt presents with functional ROM and strength with 3+/5 hip flexor strength noted, limited balance and is assessed as moderate at high fall risk 4 stage balance test, good walking speed and limited aerobic endurance per 6 minute walk test. pt ambulated 185 ft in 1:30 requiring rest break lasting 1:45 HR 101 and O2 95%, she resumed walking 185 ft  requiring rest break for remainder of test HR 103 and O2 95% on room air. Pt reported 1/10 shortness of breath on modified scale for dyspnea.Pt ambulated a total of 370 feet in 6 minute walk. General fatigue, SOB increased significantly with 6 minute walk test. Based on the Short Physical Performance Battery, patient has a frailty rating of 7/12 with </= 5/12 considered frail.    Patient demonstrated the following deficits and impairments:     Visit Diagnosis: Muscle weakness (generalized)  Other abnormalities of gait and mobility     Problem List Patient Active Problem List   Diagnosis Date Noted  . Severe aortic stenosis 09/08/2020  . Closed left ankle fracture, initial encounter 05/04/2020  . Demand ischemia ([place]) 04/28/2020  . Goals of care, counseling/discussion   .  Palliative care by specialist   . Acute GI bleeding 04/26/2020  . Focal neurological deficit 04/07/2020  . CVA (cerebral vascular accident) ([place]) 10/17/2019  . TIA (transient ischemic attack) 10/16/2019  . Slurred speech 10/16/2019  . Facial droop 10/16/2019  . Essential hypertension 10/16/2019  . Diabetes ([place]) 10/16/2019  . Stroke ([place]) 10/16/2019  . RUQ pain   . Dehydration   . Ileus ([place]) 12/12/2017  . Enteritis   . Right upper lobe pneumonia 12/08/2017  . Sepsis ([place]) 12/08/2017  . Lactic acidosis 12/08/2017  . Anxiety 12/08/2017  . Dehydration fever 12/08/2017  .  Segmental ileitis of small intestine ([place]) 06/16/2015  . Diverticulosis of colon without hemorrhage 05/25/2015  . SBO (small bowel obstruction) ([place]) 05/23/2015  . Nausea and vomiting in adult   . Nausea vomiting and diarrhea 09/03/2013  . Vomiting 09/03/2013  . Transaminitis 09/03/2013  . Left leg weakness 10/14/2012  . Difficulty in walking(719.7) 10/14/2012  . Small bowel lesion 07/09/2012  . Heme positive stool 03/14/2012  . GERD (gastroesophageal reflux disease) 03/14/2012  . Diarrhea 03/14/2012  . Right carotid bruit 11/01/2011  . Anemia, iron deficiency 10/28/2011  . Palpitations 10/16/2011  . Diabetes mellitus, type 2 ([place])   . Hypertension   . Hyperlipidemia   . Obesity   . Aortic sclerosis     Starr Lake PT, DPT, LAT, ATC  09/21/20  2:12 PM      [facility] [address] [place], [place], [address] Phone: [phone]   Fax:  [phone]  Name: RAECHAL RABEN MRN: [id number] Date of Birth: 06/28/1923

## 2020-09-22 ENCOUNTER — Encounter: Payer: Self-pay | Admitting: Surgery

## 2020-09-22 ENCOUNTER — Other Ambulatory Visit: Payer: Self-pay

## 2020-09-22 ENCOUNTER — Institutional Professional Consult (permissible substitution) (INDEPENDENT_AMBULATORY_CARE_PROVIDER_SITE_OTHER): Payer: Medicare Other | Admitting: Surgery

## 2020-09-22 VITALS — BP 127/51 | HR 70 | Resp 20 | Ht 65.0 in | Wt 169.0 lb

## 2020-09-22 DIAGNOSIS — I350 Nonrheumatic aortic (valve) stenosis: Secondary | ICD-10-CM

## 2020-09-22 NOTE — Progress Notes (Signed)
HEART AND [place] SURGERY CONSULTATION REPORT  Referring Provider is [name], MD Primary Cardiologist is Jenkins Rouge, MD PCP is [name], MD  Chief Complaint  Patient presents with  . Aortic Stenosis    Surgical Consult, eval for TAVR    HPI:  The patient is a 87 year old woman with a history of hypertension, hyperlipidemia, type 2 diabetes, stroke in January 2021 followed by right carotid endarterectomy in February 2021, and aortic stenosis followed by Dr. Johnsie Cancel.  2D echocardiogram in October 2020 showed moderate aortic stenosis with a mean gradient of 20.7 mmHg and a valve area by VTI of 0.8 cm2.  Left ventricular ejection fraction was 60 to 65%.  Her most recent echocardiogram on 06/25/2020 showed a mean gradient of 32 mmHg with a peak gradient of 51 mmHg.  Aortic valve area by VTI was 0.6 cm2.  She had a difficult hospitalization in August 2021 when she was admitted with a GI bleed and demand ischemia and suffered a mechanical fall in the hospital with fracture of her left fibula.  She has made a slow but good recovery from this and feels that she is back at her baseline.  Prior to this she was very active going to [place] games and was able to walk into the stadium.  After her fibula fracture she had to use a wheelchair for a while but is now back walking with a cane 4-prong cane.  She now reports progressive exertional shortness of breath and fatigue which has been present for a few years but has worsened over the last 6 months and is now occurring with low-level activity.  During her work-up for aortic stenosis and consideration of TAVR she underwent cardiac catheterization on 09/08/2020 which showed a 90% left circumflex stenosis and a large caliber vessel.  This was successfully treated with drug-eluting stent.  She said that since that procedure her breathing has been much  better.  Catheterization also showed a 60 to 70% distal RCA stenosis and mild nonobstructive LAD disease.  The mean gradient across aortic valve at catheterization was 57 mmHg with a valve area of 0.78 cm2.  She was continued on dual antiplatelet therapy but said that she is only taking Plavix at this time.  She was concerned about taking aspirin in addition to Plavix due to her GI bleeding in August . She denies any dizziness or syncope.  She has had no chest pain or pressure.  She has had no orthopnea.  She denies peripheral edema.  She lives independently by herself.  She takes care of her house.  She no longer drives due to macular degeneration.  She has 4 living children that she raised on her own and the take care of her meals.  Past Medical History:  Diagnosis Date  . Anxiety   . Carotid stenosis, right 10/16/2019   75% on CTA neck  . Cholelithiasis    on u/s 09/2011  . Degenerative joint disease    Right THA in 1995; cervical discectomy and fusion-2001  . Diabetes mellitus, type 2 (HCC)    + neuropathy  . Emphysema   . Gastroesophageal reflux disease   . Hiatal hernia   . Hyperlipidemia   . Hypertension   . IDA (iron deficiency anemia)    labs 09/2011  . Mild cognitive impairment   . Obesity   . Osteoporosis   . Pneumonia   . Severe aortic stenosis   .  Sigmoid diverticulosis   . Small bowel lesion    On Given's capsule; Dr Kathleene Hazel 07/2012, no further w/u needed  . Stroke [facility])     Past Surgical History:  Procedure Laterality Date  . ANTERIOR CERVICAL DISCECTOMY  2001   C4-5, allograft, fixation  . BREAST LUMPECTOMY     benign  . CATARACT EXTRACTION W/ INTRAOCULAR LENS IMPLANT  2007   Left  . COLONOSCOPY  2006  . COLONOSCOPY  04/03/12   Dr. Ok Edwards diverticulosis, negative microscopic  colitis   . CORONARY STENT INTERVENTION N/A 09/08/2020   Procedure: CORONARY STENT INTERVENTION;  Surgeon: Sherren Mocha, MD;  Location: [place] [facility];  Service:  Cardiovascular;  Laterality: N/A;  . ENDARTERECTOMY Right 10/21/2019   Procedure: ENDARTERECTOMY CAROTID RIGHT;  Surgeon: Waynetta Sandy, MD;  Location: [place];  Service: Vascular;  Laterality: Right;  . ESOPHAGOGASTRODUODENOSCOPY  04/03/12   Dr. Jennet Maduro hernia, chronic gastritis on bx  . ESOPHAGOGASTRODUODENOSCOPY (EGD) WITH PROPOFOL N/A 04/28/2020   Procedure: ESOPHAGOGASTRODUODENOSCOPY (EGD) WITH PROPOFOL;  Surgeon: Otis Brace, MD;  Location: [place];  Service: Gastroenterology;  Laterality: N/A;  . GIVENS CAPSULE STUDY  05/09/2012   RMR: an unclear raised area of small bowel was noted, with features almost  characteristic of very small polyp. This was without villous  blunting or any evidence of active bleeding; yet, the area of  concern appeared to be erythematous. However, this could simply  be a light reflection on a normal variation of the small bowel. REFERRED TO DR. GILLIAM, APPT FOR NOVEMBER 2013.   [name] GIVENS CAPSULE STUDY N/A 04/30/2020   Procedure: GIVENS CAPSULE STUDY;  Surgeon: Otis Brace, MD;  Location: [place];  Service: Gastroenterology;  Laterality: N/A;  . HIP ARTHROPLASTY Right   . PATCH ANGIOPLASTY Right 10/21/2019   Procedure: PATCH ANGIOPLASTY USING Rueben Bash BIOLOGIC PATCH;  Surgeon: Waynetta Sandy, MD;  Location: [place];  Service: Vascular;  Laterality: Right;  . POLYPECTOMY  04/28/2020   Procedure: POLYPECTOMY;  Surgeon: Otis Brace, MD;  Location: [place] [facility];  Service: Gastroenterology;;  . RIGHT/LEFT HEART CATH AND CORONARY ANGIOGRAPHY N/A 09/08/2020   Procedure: RIGHT/LEFT HEART CATH AND CORONARY ANGIOGRAPHY;  Surgeon: Sherren Mocha, MD;  Location: [place] [facility];  Service: Cardiovascular;  Laterality: N/A;  . TONSILLECTOMY    . TOTAL HIP ARTHROPLASTY  1995   Right    Family History  Problem Relation Age of Onset  . Cirrhosis Mother        no etoh use  . CAD Father   . Diabetes Mother        + brother  .  Heart failure Father   . Hypertension Father        + brother  . Cancer Brother        lung, age 19  . Uterine cancer Sister   . Colon cancer Neg Hx     Social History   Socioeconomic History  . Marital status: Divorced    Spouse name: Not on file  . Number of children: 4  . Years of education: 6th grade  . Highest education level: Not on file  Occupational History  . Occupation: Retired    Comment: [employer]  Tobacco Use  . Smoking status: Never Smoker  . Smokeless tobacco: Never Used  . Tobacco comment: Quit x 50 years  Vaping Use  . Vaping Use: Never used  Substance and Sexual Activity  . Alcohol use: Never  . Drug use: Never  .  Sexual activity: Not Currently  Other Topics Concern  . Not on file  Social History Narrative   Lives alone. Every other week she has family with her. Daughter and son take care of her during the days.    Right-handed.   No daily use of caffeine.             Social Determinants of Health   Financial Resource Strain: Not on file  Food Insecurity: Not on file  Transportation Needs: Not on file  Physical Activity: Not on file  Stress: Not on file  Social Connections: Not on file  Intimate Partner Violence: Not on file    Current Outpatient Medications  Medication Sig Dispense Refill  . ALPRAZolam (XANAX) 0.5 MG tablet Take 0.5 mg by mouth at bedtime as needed for anxiety.    . Ascorbic Acid (VITAMIN C PO) Take 1 tablet by mouth daily.    [name] atorvastatin (LIPITOR) 40 MG tablet Take 1 tablet (40 mg total) by mouth daily at 6 PM. 30 tablet 2  . cholecalciferol (VITAMIN D) 25 MCG (1000 UNIT) tablet Take 1,000 Units by mouth daily.    . clopidogrel (PLAVIX) 75 MG tablet Take 1 tablet (75 mg total) by mouth daily with breakfast. 90 tablet 3  . glimepiride (AMARYL) 1 MG tablet Take 1 mg by mouth daily with breakfast.    . Glycerin-Hypromellose-PEG 400 (VISINE DRY EYE OP) Apply 1 drop to eye 3 (three) times daily as needed (for dry eye relief).      . LUTEIN PO Take by mouth daily.    . meclizine (ANTIVERT) 12.5 MG tablet Take 1 tablet (12.5 mg total) by mouth 3 (three) times daily as needed for dizziness. 20 tablet 0  . metoprolol tartrate (LOPRESSOR) 25 MG tablet Take 0.5 tablets (12.5 mg total) by mouth 2 (two) times daily. 30 tablet 0  . Multiple Vitamins-Minerals (PRESERVISION AREDS 2 PO) Take 1 tablet by mouth See admin instructions. Takes 1 tablet in the morning and 1 tablet at night for eyes.    . pantoprazole (PROTONIX) 40 MG tablet Take 1 tablet (40 mg total) by mouth daily. 90 tablet 3  . vitamin E 180 MG (400 UNITS) capsule Take 400 Units by mouth daily.     No current facility-administered medications for this visit.    No Known Allergies    Review of Systems:   General:  normal appetite, + decreased energy, no weight gain, no weight loss, no fever  Cardiac:  no chest pain with exertion, no chest pain at rest, +SOB with low level exertion, no resting SOB, no PND, no orthopnea, no palpitations, no arrhythmia, no atrial fibrillation, no LE edema, no dizzy spells, no syncope  Respiratory:  + exertional shortness of breath, no home oxygen, no productive cough, no dry cough, no bronchitis, no wheezing, no hemoptysis, no asthma, no pain with inspiration or cough, no sleep apnea, no CPAP at night  GI:   no difficulty swallowing, no reflux, no frequent heartburn, no hiatal hernia, no abdominal pain, no constipation, no diarrhea, no hematochezia, no hematemesis, no melena  GU:   no dysuria,  + frequency, no urinary tract infection, no hematuria, no enlarged prostate, no kidney stones, no kidney disease  Vascular:  no pain suggestive of claudication, no pain in feet, + leg cramps, no varicose veins, no DVT, no non-healing foot ulcer  Neuro:   + stroke, + TIA's, no seizures, no headaches, no temporary blindness one eye,  no slurred speech,  no peripheral neuropathy, no chronic pain, no instability of gait, no memory/cognitive  dysfunction  Musculoskeletal: + arthritis, no joint swelling, no myalgias, no difficulty walking, normal mobility for age  Skin:   no rash, no itching, no skin infections, no pressure sores or ulcerations  Psych:   no anxiety, no depression, no nervousness, no unusual recent stress  Eyes:   + blurry vision, no floaters, + recent vision changes, + wears glasses   ENT:   no hearing loss, no loose or painful teeth, + dentures Hematologic:  no easy bruising, no abnormal bleeding, no clotting disorder, no frequent epistaxis  Endocrine:  + diabetes, does check CBG's at home           Physical Exam:   BP (!) 127/51   Pulse 70   Resp 20   Ht 5' 5" (1.651 m)   Wt 169 lb (76.7 kg)   SpO2 97% Comment: RA  BMI 28.12 kg/m   General:  Elderly but spunky and  well-appearing  HEENT:  Unremarkable, NCAT, PERLA, EOMI,   Neck:   no JVD, no bruits, no adenopathy   Chest:   clear to auscultation, symmetrical breath sounds, no wheezes, no rhonchi   CV:   RRR, grade lll/VI crescendo/decrescendo murmur heard best at RSB,  no diastolic murmur  Abdomen:  soft, non-tender, no masses   Extremities:  warm, well-perfused, pulses palpable at ankle, no LE edema  Rectal/GU  Deferred  Neuro:   Grossly non-focal and symmetrical throughout  Skin:   Clean and dry, no rashes, no breakdown   Diagnostic Tests:  ECHOCARDIOGRAM REPORT       Patient Name:  Denise Parrish Date of Exam: 06/25/2020  Medical Rec #: [id number]     Height:    65.0 in  Accession #:  [id number]     Weight:    165.0 lb  Date of Birth: [birth year]      BSA:     1.823 m  Patient Age:  89 years      BP:      128/76 mmHg  Patient Gender: F         HR:      81 bpm.  Exam Location: [facility]   Procedure: 2D Echo, Cardiac Doppler and Color Doppler   Indications:  Aortic Stenosis 424.1 / 135.0    History:    Patient has prior history of Echocardiogram examinations,  most          recent 04/26/2020. TIA, Aortic Valve Disease; Risk         Factors:Hypertension, Diabetes and Dyslipidemia.    Sonographer:  Alvino Chapel RCS  Referring Phys: [place]    1. Left ventricular ejection fraction, by estimation, is 60 to 65%. The  left ventricle has normal function. The left ventricle has no regional  wall motion abnormalities. There is mild left ventricular hypertrophy.  Left ventricular diastolic parameters  are consistent with Grade I diastolic dysfunction (impaired relaxation).  2. Right ventricular systolic function is normal. The right ventricular  size is normal. There is normal pulmonary artery systolic pressure. The  estimated right ventricular systolic pressure is 04.8 mmHg.  3. Left atrial size was mildly dilated.  4. Right atrial size was mildly dilated.  5. The mitral valve is degenerative. Mild mitral valve regurgitation.  Moderate mitral annular calcification.  6. The aortic valve is tricuspid. There is severe calcifcation of the  aortic valve. Aortic valve regurgitation is  trivial. Moderate to severe  aortic valve stenosis. Aortic valve mean gradient measures 32.0 mmHg.  Aortic valve Vmax measures 3.57 m/s.  Dimentionless index 0.34.  7. The inferior vena cava is normal in size with greater than 50%  respiratory variability, suggesting right atrial pressure of 3 mmHg.   FINDINGS  Left Ventricle: Left ventricular ejection fraction, by estimation, is 60  to 65%. The left ventricle has normal function. The left ventricle has no  regional wall motion abnormalities. The left ventricular internal cavity  size was normal in size. There is  mild left ventricular hypertrophy. Left ventricular diastolic parameters  are consistent with Grade I diastolic dysfunction (impaired relaxation).   Right Ventricle: The right ventricular size is normal. No increase in  right ventricular wall thickness. Right  ventricular systolic function is  normal. There is normal pulmonary artery systolic pressure. The tricuspid  regurgitant velocity is 2.76 m/s, and  with an assumed right atrial pressure of 3 mmHg, the estimated right  ventricular systolic pressure is 96.2 mmHg.   Left Atrium: Left atrial size was mildly dilated.   Right Atrium: Right atrial size was mildly dilated.   Pericardium: There is no evidence of pericardial effusion.   Mitral Valve: The mitral valve is degenerative in appearance. Moderate  mitral annular calcification. Mild mitral valve regurgitation.   Tricuspid Valve: The tricuspid valve is grossly normal. Tricuspid valve  regurgitation is mild.   Aortic Valve: The aortic valve is tricuspid. There is severe calcifcation  of the aortic valve. Aortic valve regurgitation is trivial. Moderate to  severe aortic stenosis is present. Aortic valve mean gradient measures  32.0 mmHg. Aortic valve peak gradient  measures 51.0 mmHg. Aortic valve area, by VTI measures 0.60 cm.   Pulmonic Valve: The pulmonic valve was grossly normal. Pulmonic valve  regurgitation is trivial.   Aorta: The aortic root is normal in size and structure.   Venous: The inferior vena cava is normal in size with greater than 50%  respiratory variability, suggesting right atrial pressure of 3 mmHg.   IAS/Shunts: No atrial level shunt detected by color flow Doppler.     LEFT VENTRICLE  PLAX 2D  LVIDd:     4.19 cm  LVIDs:     2.66 cm  LV PW:     1.30 cm  LV IVS:    1.22 cm  LVOT diam:   1.50 cm  LV SV:     56  LV SV Index:  31  LVOT Area:   1.77 cm     RIGHT VENTRICLE  RV S prime:   10.20 cm/s  TAPSE (M-mode): 1.9 cm   LEFT ATRIUM      Index    RIGHT ATRIUM      Index  LA diam:   3.70 cm 2.03 cm/m RA Area:   21.20 cm  LA Vol (A4C): 66.9 ml 36.70 ml/m RA Volume:  66.50 ml 36.48 ml/m  AORTIC VALVE  AV Area (Vmax):  0.65 cm  AV Area  (Vmean):  0.62 cm  AV Area (VTI):   0.60 cm  AV Vmax:      357.00 cm/s  AV Vmean:     269.000 cm/s  AV VTI:      0.942 m  AV Peak Grad:   51.0 mmHg  AV Mean Grad:   32.0 mmHg  LVOT Vmax:     130.50 cm/s  LVOT Vmean:    93.950 cm/s  LVOT VTI:     0.318 m  LVOT/AV VTI ratio: 0.34    AORTA  Ao Root diam: 2.80 cm   MITRAL VALVE        TRICUSPID VALVE  MV Area (PHT): 2.65 cm   TR Peak grad:  30.5 mmHg  MV Decel Time: 286 msec   TR Vmax:    276.00 cm/s  MV E velocity: 172.67 cm/s  MV A velocity: 186.00 cm/s SHUNTS  MV E/A ratio: 0.93     Systemic VTI: 0.32 m               Systemic Diam: 1.50 cm   Rozann Lesches MD  Electronically signed by Rozann Lesches MD  Signature Date/Time: 06/25/2020/4:51:26 PM     Physicians  Panel Physicians Referring Physician Case Authorizing Physician  Sherren Mocha, MD (Primary)      Procedures  CORONARY STENT INTERVENTION  RIGHT/LEFT HEART CATH AND CORONARY ANGIOGRAPHY   Conclusion  1.  Severe 90% left circumflex stenosis (large caliber vessel) with successful PCI using a 3.5 x 12 mm resolute Onyx DES 2.  Moderate to severe distal RCA stenosis of 60 to 70%, recommend medical therapy 3.  Mild nonobstructive LAD stenosis 4.  Severe aortic stenosis with mean gradient 57 mmHg and aortic valve area 0.78 cm  Recommendations: Dual antiplatelet therapy with aspirin and clopidogrel x6 months.  Continue Cangrelor infusion x2 hours then loaded with clopidogrel 600 mg x 1.  Overnight observation in this elderly patient with severe aortic stenosis post PCI.  Continue outpatient TAVR evaluation.   Recommendations  Antiplatelet/Anticoag Recommend uninterrupted dual antiplatelet therapy with Aspirin 46m daily and Clopidogrel 726mdaily for a minimum of 6 months (stable ischemic heart disease-Class I recommendation).   Indications  Severe aortic stenosis [I35.0  (ICD-10-CM)]   Procedural Details  Technical Details INDICATION: Severe symptomatic aortic stenosis with class III symptoms.  PROCEDURAL DETAILS: The right antecubital fossa is prepped, draped, and anesthetized with 1% lidocaine.  Using ultrasound guidance, the right basilic vein is accessed without difficulty.  Ultrasound images are digitally captured and stored in the patient's chart.  A 4/5 French slender sheath is inserted.  Attention was then turned to the right wrist.  The right wrist was then prepped, draped, and anesthetized with 1% lidocaine. Using ultrasound guidance a 5/6 French Slender sheath was placed in the right radial artery.  Ultrasound images were again captured and stored in the patient's chart.  Intra-arterial verapamil was administered through the radial artery sheath. IV heparin was administered after a JR4 catheter was advanced into the central aorta. A Swan-Ganz catheter was used for the right heart catheterization. Standard protocol was followed for recording of right heart pressures and sampling of oxygen saturations. Fick cardiac output was calculated. Standard Judkins catheters were used for selective coronary angiography. LV pressure is recorded and an aortic valve pullback is performed.  An AL-1 catheter and a straight tip wire are required to cross the aortic valve.  PCI is performed after the diagnostic procedure.  The patient is given additional heparin and a therapeutic ACT greater than 250 seconds is achieved.  IV Cangrelor is initiated via bolus and drip protocol.  The patient will be loaded with clopidogrel at the completion of the cangrelor infusion.  An EBU 3.5 cm guide catheter is used to access the left circumflex.  See PCI note for further details.  There were no immediate procedural complications. The patient was transferred to the post catheterization recovery area for further monitoring.    Estimated blood loss <50 mL.  During this procedure medications were  administered to achieve and maintain moderate conscious sedation while the patient's heart rate, blood pressure, and oxygen saturation were continuously monitored and I was present face-to-face 100% of this time.   Medications (Filter: Administrations occurring from 1027 to 1202 on 09/08/20) (important) Continuous medications are totaled by the amount administered until 09/08/20 1202.    Heparin (Porcine) in NaCl 1000-0.9 UT/500ML-% SOLN (mL) Total volume:  1,000 mL  Date/Time Rate/Dose/Volume Action   09/08/20 1040 500 mL Given   1040 500 mL Given    fentaNYL (SUBLIMAZE) injection (mcg) Total dose:  50 mcg  Date/Time Rate/Dose/Volume Action   09/08/20 1056 25 mcg Given   1127 25 mcg Given    midazolam (VERSED) injection (mg) Total dose:  2 mg  Date/Time Rate/Dose/Volume Action   09/08/20 1056 1 mg Given   1127 1 mg Given    lidocaine (PF) (XYLOCAINE) 1 % injection (mL) Total volume:  4 mL  Date/Time Rate/Dose/Volume Action   09/08/20 1058 2 mL Given   1104 2 mL Given    Radial Cocktail/Verapamil only (mL) Total volume:  10 mL  Date/Time Rate/Dose/Volume Action   09/08/20 1104 10 mL Given    heparin sodium (porcine) injection (Units) Total dose:  7,000 Units  Date/Time Rate/Dose/Volume Action   09/08/20 1109 4,000 Units Given   1124 3,000 Units Given    cangrelor [facility]) bolus via infusion (mcg/kg) Total dose:  2,313 mcg Dosing weight:  77.1  Date/Time Rate/Dose/Volume Action   09/08/20 1134 2,313 mcg Given    cangrelor (KENGREAL) 50,000 mcg in sodium chloride 0.9 % 250 mL (200 mcg/mL) infusion (mcg/kg/min) Total dose:  Cannot be calculated* Dosing weight:  77.1  *Continuous medication not stopped within the calculation time range. Date/Time Rate/Dose/Volume Action   09/08/20 1136 4 mcg/kg/min - 92.5 mL/hr New Bag/Given    iohexol (OMNIPAQUE) 350 MG/ML injection (mL) Total volume:  85 mL  Date/Time Rate/Dose/Volume Action   09/08/20 1155 85 mL  Given    Sedation Time  Sedation Time Physician-1: 48 minutes 50 seconds   Contrast  Medication Name Total Dose  iohexol (OMNIPAQUE) 350 MG/ML injection 85 mL    Radiation/Fluoro  Fluoro time: 6.6 (min) DAP: 17965 (mGycm2) Cumulative Air Kerma: 381 (mGy)   Coronary Findings   Diagnostic Dominance: Right  Left Anterior Descending  Vessel is moderate in size. There is mild diffuse disease throughout the vessel. The LAD exhibits diffuse nonobstructive disease. The LAD and circumflex have nearly separate ostia with a very short left main stem. The diagonal branches are small in caliber but patent without significant stenosis.  Left Circumflex  Vessel is very large. The circumflex is a very large caliber vessel. The vessel has a tight 90% focal stenosis in its midportion. The vessel then goes on to supply a very large obtuse marginal branch that supplies multiple side branches to the posterior lateral wall. There are no other high-grade stenoses.  Ost Cx lesion is 30% stenosed.  Mid Cx lesion is 90% stenosed. The lesion is moderately calcified.  Right Coronary Artery  The RCA is dominant. The vessel has a moderate 60 to 70% stenosis in its mid to distal portion. There is diffuse plaquing without any critical stenoses identified. The PDA is relatively large in caliber with no stenosis.  Dist RCA lesion is 70% stenosed. The lesion is moderately calcified.   Intervention   Mid Cx lesion  Stent  CATH LAUNCHER 6FR EBU3.5 guide catheter was inserted. Lesion crossed  with guidewire using a WIRE COUGAR XT STRL 190CM. Pre-stent angioplasty was performed using a BALLOON SAPPHIRE 2.5X12. A drug-eluting stent was successfully placed using a STENT RESOLUTE ONYX 3.5X12. Maximum pressure: 16 atm. Post-stent angioplasty was performed using a BALLOON SAPPHIRE [place] 3.75X10. Maximum pressure: 16 atm.  Post-Intervention Lesion Assessment  The intervention was successful. Pre-interventional TIMI flow  is 3. Post-intervention TIMI flow is 3. No complications occurred at this lesion.  There is a 0% residual stenosis post intervention.   Left Heart  Aortic Valve There is severe aortic valve stenosis. The aortic valve is calcified. There is restricted aortic valve motion. Mean transaortic valve gradient is 57 mmHg, peak to peak gradient is 57 mmHg, peak instantaneous gradient is 87 mmmmHg, calculated aortic valve area of 0.78 cm   Coronary Diagrams   Diagnostic Dominance: Right    Intervention     Implants    Permanent Stent   Stent Resolute Onyx 3.5x12 - [id number] - Implanted  Inventory item: Loma Sousa 3.5X12 Model/Cat number: [id number]  Manufacturer: [place] Lot number: [id number]  Device identifier: [id number] Device identifier type: GS1  Area Of Implantation: Mid CFX     GUDID Information  Request status Successful    Brand name: Resolute OnyxT Version/Model: [id number]  Company name: [employer] MRI safety info as of 09/08/20: MR Conditional  Contains dry or latex rubber: No    GMDN P.T. name: Drug-eluting coronary artery stent, non-bioabsorbable-polymer-coated     As of 09/08/2020  Status: Implanted       Syngo Images  Show images for CARDIAC CATHETERIZATION  Images on Long Term Storage  Show images for [place], [employer] P  Link to Procedure Log  Procedure Log     Hemo Data  Flowsheet Row Most Recent Value  Fick Cardiac Output 5.51 L/min  Fick Cardiac Output Index 2.98 (L/min)/BSA  Aortic Mean Gradient 56.57 mmHg  Aortic Peak Gradient 57 mmHg  Aortic Valve Area 0.78  Aortic Value Area Index 0.42 cm2/BSA  RA A Wave 6 mmHg  RA V Wave 2 mmHg  RA Mean 1 mmHg  RV Systolic Pressure 40 mmHg  RV Diastolic Pressure 0 mmHg  RV EDP 6 mmHg  PA Systolic Pressure 41 mmHg  PA Diastolic Pressure 7 mmHg  PA Mean 22 mmHg  PW A Wave 15 mmHg  PW V Wave 21 mmHg  PW Mean 9 mmHg  AO Systolic Pressure 258 mmHg   AO Diastolic Pressure 57 mmHg  AO Mean 93 mmHg  LV Systolic Pressure 527 mmHg  LV Diastolic Pressure 0 mmHg  LV EDP 15 mmHg  AOp Systolic Pressure 782 mmHg  AOp Diastolic Pressure 46 mmHg  AOp Mean Pressure 79 mmHg  LVp Systolic Pressure 423 mmHg  LVp Diastolic Pressure 0 mmHg  LVp EDP Pressure 8 mmHg  QP/QS 1  TPVR Index 7.36 HRUI  TSVR Index 31.14 HRUI  PVR SVR Ratio 0.14  TPVR/TSVR Ratio 0.24   ADDENDUM REPORT: 09/21/2020 14:21  CLINICAL DATA:  Aortic stenosis  EXAM: Cardiac TAVR CT  TECHNIQUE: The patient was scanned on a Siemens Force 536 slice scanner. A 120 kV retrospective scan was triggered in the descending thoracic aorta at 111 HU's. Gantry rotation speed was 270 msecs and collimation was .9 mm. No beta blockade or nitro were given. The 3D data set was reconstructed in 5% intervals of the R-R cycle. Systolic and diastolic phases were analyzed on a dedicated work station using MPR, MIP and VRT modes. The patient received  80 cc of contrast.  FINDINGS: Aortic Valve: Moderately calcified non coronary cusp with thickened leaflet edges tri leaflet valve Aortic calcium score 1339  Aorta: Mild atherosclerosis arch and great vessels not visualized  Sinotubular Junction: 22 mm  Ascending Thoracic Aorta: 31 mm  Aortic Arch: Not visualized  Descending Thoracic Aorta: 22 mm  Sinus of Valsalva Measurements:  Non-coronary: 26.6 mm  Right - coronary: 24.2 mm  Left - coronary: 25.4 mm  Coronary Artery Height above Annulus:  Left Main: 11.8 mm above annulus  Right Coronary: 10.8 mm above annulus  Virtual Basal Annulus Measurements:  Maximum/Minimum Diameter: 20.9 mm x 16.7 mm  Perimeter: 61 mm c  Area: 279 mm 2  Coronary Arteries: Sufficient height above annulus for deployment  Optimum Fluoroscopic Angle for Delivery: LAO 26 Caudal 1 degree  IMPRESSION: 1. Tri-leaflet AV? Rheumatic with small annulus area 279 mm2 best suited  for a 23 mm Medtronic Evolut Pro valve  2.  Coronary arteries sufficient height above annulus for deployment  3.  Normal aortic root 3.1 cm  4.  Optimum angiographic angle for deployment LAO 26 Caudal 1 degree  5.  AV calcium score 1339  Jenkins Rouge   Electronically Signed   By: Jenkins Rouge M.D.   On: 09/21/2020 14:21   Addended by Josue Hector, MD on 09/21/2020 2:24 PM    Study Result  Narrative & Impression  EXAM: OVER-READ INTERPRETATION  CT CHEST  The following report is an over-read performed by radiologist Dr. Vinnie Langton of [facility], [place] on 09/21/2020. This over-read does not include interpretation of cardiac or coronary anatomy or pathology. The coronary calcium score/coronary CTA interpretation by the cardiologist is attached.  COMPARISON:  Chest CT 10/05/2019.  FINDINGS: Extracardiac findings will be described separately under dictation for contemporaneously obtained CTA chest, abdomen and pelvis.  IMPRESSION: Please see separate dictation for contemporaneously obtained CTA chest, abdomen and pelvis dated 09/21/2020 for full description of relevant extracardiac findings.  Electronically Signed: By: Vinnie Langton M.D. On: 09/21/2020 11:22     Narrative & Impression  CLINICAL DATA:  87 year old female with history of severe aortic stenosis. Preprocedural study prior to potential transcatheter aortic valve replacement (TAVR) procedure.  EXAM: CT ANGIOGRAPHY CHEST, ABDOMEN AND PELVIS  TECHNIQUE: Non-contrast CT of the chest was initially obtained.  Multidetector CT imaging through the chest, abdomen and pelvis was performed using the standard protocol during bolus administration of intravenous contrast. Multiplanar reconstructed images and MIPs were obtained and reviewed to evaluate the vascular anatomy.  CONTRAST:  127m OMNIPAQUE IOHEXOL 350 MG/ML SOLN  COMPARISON:  CT of the abdomen and pelvis  04/28/2020. Chest CT 10/05/2019.  FINDINGS: CTA CHEST FINDINGS  Cardiovascular: Heart size is normal. There is no significant pericardial fluid, thickening or pericardial calcification. There is aortic atherosclerosis, as well as atherosclerosis of the great vessels of the mediastinum and the coronary arteries, including calcified atherosclerotic plaque in the left main, left anterior descending and left circumflex coronary arteries. Coronary artery stent in the left circumflex coronary artery. Severe thickening calcification of the aortic valve. Moderate calcifications of the mitral annulus.  Mediastinum/Lymph Nodes: No pathologically enlarged mediastinal or hilar lymph nodes. Small hiatal hernia. No axillary lymphadenopathy.  Lungs/Pleura: 3 mm pulmonary nodule in the left lower lobe (axial image 67 of series 16) stable compared to prior examinations, considered definitively benign. Small calcified granuloma in the right lower lobe. No other larger more suspicious appearing pulmonary nodules or masses are noted. Bilateral apical pleuroparenchymal  thickening and nodular architectural distortion, most compatible with areas of chronic post infectious or inflammatory scarring. No acute consolidative airspace disease. No pleural effusions.  Musculoskeletal/Soft Tissues: Orthopedic fixation hardware from ACDF in the cervical spine. There are no aggressive appearing lytic or blastic lesions noted in the visualized portions of the skeleton.  CTA ABDOMEN AND PELVIS FINDINGS  Hepatobiliary: Liver has a shrunken appearance and nodular contour, of cirrhosis. Small calcified granuloma in the periphery of the right lower lobe. No other suspicious cystic or solid hepatic lesions. No intra or extrahepatic biliary ductal dilatation. Large noncalcified gallstone measuring up to 3.1 cm in diameter lying dependently in the gallbladder. No findings to suggest an acute cholecystitis at  this time.  Pancreas: No pancreatic mass. No pancreatic ductal dilatation. No pancreatic or peripancreatic fluid collections or inflammatory changes.  Spleen: Unremarkable.  Adrenals/Urinary Tract: In the posterolateral aspect of the interpolar region of the right kidney there is a 1.2 cm high attenuation lesion (79 HU), which is incompletely characterized on today's examination, but stable over numerous prior studies, presumably a small proteinaceous/hemorrhagic cyst. Other low-attenuation lesions in both kidneys are noted, largest of which are compatible with simple cysts, largest of which is exophytic in the upper pole the left kidney measuring 2.8 x 2.5 cm. Other subcentimeter low-attenuation lesions in both kidneys, too small to characterize, but statistically likely to represent tiny cysts. No hydroureteronephrosis. Urinary bladder is normal in appearance. Bilateral adrenal glands are normal in appearance.  Stomach/Bowel: Normal appearance of the stomach. No pathologic dilatation of small bowel or colon. Numerous colonic diverticulae are noted, particularly in the sigmoid colon, without surrounding inflammatory changes to suggest an acute diverticulitis at this time. Normal appendix.  Vascular/Lymphatic: Aortic atherosclerosis, without evidence of aneurysm or dissection in the abdominal or pelvic vasculature. Vascular findings and measurements pertinent to potential TAVR procedure, as detailed below. No lymphadenopathy identified in the abdomen or pelvis.  Reproductive: Uterus and ovaries are unremarkable in appearance.  Other: No significant volume of ascites.  No pneumoperitoneum.  Musculoskeletal: Status post right hip arthroplasty. There are no aggressive appearing lytic or blastic lesions noted in the visualized portions of the skeleton.  VASCULAR MEASUREMENTS PERTINENT TO TAVR:  AORTA:  Minimal Aortic Diameter-12 x 11 mm  Severity of Aortic  Calcification-moderate to severe  RIGHT PELVIS:  Right Common Iliac Artery -  Minimal Diameter-6.5 x 5.5 mm  Tortuosity-mild  Calcification-moderate  Right External Iliac Artery -  Minimal Diameter-5.6 x 5.7 mm  Tortuosity - mild  Calcification-none  Right Common Femoral Artery -  Minimal Diameter-6.1 x 6.2 mm  Tortuosity - mild  Calcification-mild  LEFT PELVIS:  Left Common Iliac Artery -  Minimal Diameter-6.7 x 6.8 mm  Tortuosity - mild  Calcification-moderate  Left External Iliac Artery -  Minimal Diameter-6.5 x 6.4 mm  Tortuosity-mild-to-moderate  Calcification-none  Left Common Femoral Artery -  Minimal Diameter-6.1 x 5.1 mm  Tortuosity - mild  Calcification-mild  Review of the MIP images confirms the above findings.  IMPRESSION: 1. Vascular findings and measurements pertinent to potential TAVR procedure, as detailed above. 2. Severe thickening and calcification of the aortic valve, compatible with reported clinical history of severe aortic stenosis. 3. Aortic atherosclerosis, in addition to left main and 2 vessel coronary artery disease. Status post PTC I in the left circumflex coronary artery. 4. Morphologic changes in the liver compatible with underlying cirrhosis. 5. Cholelithiasis without evidence of acute cholecystitis at this time. 6. Colonic diverticulosis without evidence of acute diverticulitis. 7.  Additional incidental findings, as above.   Electronically Signed   By: Vinnie Langton M.D.   On: 09/21/2020 13:13     Procedure: AVR + CAB  Risk of Mortality:  7.356%  Renal Failure:  5.840%  Permanent Stroke:  8.678%  Prolonged Ventilation:  22.992%  DSW Infection:  0.230%  Reoperation:  6.804%  Morbidity or Mortality:  30.584%  Short Length of Stay:  5.250%  Long Length of Stay:  23.973%     Impression:  This 87 year old woman presents with stage D, severe, symptomatic  aortic stenosis with New York Heart Association class III symptoms of exertional fatigue and shortness of breath consistent with chronic diastolic congestive heart failure.  I have personally reviewed her 2D echocardiogram, cardiac catheterization, and CTA studies.  Her echocardiogram shows a trileaflet aortic valve with severe calcification and restricted mobility.  The mean gradient was measured at 32 mmHg with a peak gradient of 51 mmHg.  Aortic valve area by VTI was 0.6 cm.  Left ventricular ejection fraction was 60 to 65%.  Cardiac catheterization showed a high-grade left circumflex stenosis that was successfully treated with a drug-eluting stent on 09/08/2020.  She has been maintained on Plavix since then.  Cardiac catheterization showed a mean gradient across aortic valve of 57 mmHg consistent with severe aortic stenosis.  She said that she has much improved after stenting of her left circumflex coronary artery.  She is not a candidate for open surgical AVR but I think would be a reasonable candidate for transcatheter aortic valve replacement given her activity level and independence at 87 years old.  Her gated cardiac CTA shows a relatively small aortic annulus of 279 mm that is suitable for a 23 mm Medtronic Evolut Pro valve.  Her abdominal and pelvic CTA shows adequate pelvic vascular anatomy to allow transfemoral insertion.  The patient and her daughter were counseled at length regarding treatment alternatives for management of severe symptomatic aortic stenosis. The risks and benefits of surgical intervention has been discussed in detail. Long-term prognosis with medical therapy was discussed. Alternative approaches such as conventional surgical aortic valve replacement, transcatheter aortic valve replacement, and palliative medical therapy were compared and contrasted at length. This discussion was placed in the context of the patient's own specific clinical presentation and past medical history.  All of their questions have been addressed.   Following the decision to proceed with transcatheter aortic valve replacement, a discussion was held regarding what types of management strategies would be attempted intraoperatively in the event of life-threatening complications, including whether or not the patient would be considered a candidate for the use of cardiopulmonary bypass and/or conversion to open sternotomy for attempted surgical intervention.  At 87 years old I do not think she is a candidate for emergent sternotomy to manage any intraoperative complications.  The patient is aware of the fact that transient use of cardiopulmonary bypass may be necessary. The patient has been advised of a variety of complications that might develop including but not limited to risks of death, stroke, paravalvular leak, aortic dissection or other major vascular complications, aortic annulus rupture, device embolization, cardiac rupture or perforation, mitral regurgitation, acute myocardial infarction, arrhythmia, heart block or bradycardia requiring permanent pacemaker placement, congestive heart failure, respiratory failure, renal failure, pneumonia, infection, other late complications related to structural valve deterioration or migration, or other complications that might ultimately cause a temporary or permanent loss of functional independence or other long term morbidity. The patient provides full informed consent for the  procedure as described and all questions were answered.     Plan:  She will be scheduled for transfemoral transcatheter aortic valve placement using a Medtronic Evolut Pro valve on Tuesday, 09/28/2020  I spent 60 minutes performing this consultation and > 50% of this time was spent face to face counseling and coordinating the care of this patient's severe symptomatic aortic stenosis.     Gaye Pollack, MD 09/22/2020

## 2020-09-23 ENCOUNTER — Ambulatory Visit (INDEPENDENT_AMBULATORY_CARE_PROVIDER_SITE_OTHER): Payer: Medicare Other | Admitting: Physician Assistant

## 2020-09-23 ENCOUNTER — Other Ambulatory Visit: Payer: Self-pay

## 2020-09-23 ENCOUNTER — Ambulatory Visit (HOSPITAL_COMMUNITY)
Admission: RE | Admit: 2020-09-23 | Discharge: 2020-09-23 | Disposition: A | Discharge status: Home / Self Care | Payer: Medicare Other | Source: Ambulatory Visit | Attending: Physician Assistant | Admitting: Physician Assistant

## 2020-09-23 VITALS — BP 154/70 | HR 95 | Temp 97.2°F | Resp 18 | Ht 65.0 in | Wt 170.1 lb

## 2020-09-23 DIAGNOSIS — I6521 Occlusion and stenosis of right carotid artery: Secondary | ICD-10-CM

## 2020-09-23 NOTE — Progress Notes (Signed)
[place], [place] [address] Phone: [phone] Fax: [phone]  [facility] [place], [place] [address] [place] [address] Phone: [phone] Fax: [phone]      Your procedure is scheduled on Tuesday, January 11th.  Report to [facility] Main Entrance "A" at 5:30 A.M., and check in at the Admitting office.  Call this number if you have problems the morning of surgery:  [phone]  Call [phone] if you have any questions prior to your surgery date Monday-Friday 8am-4pm    Remember:  Do not eat or drink after midnight the night before your surgery   Take these medicines the morning of surgery with A SIP OF WATER   NONE  Continue taking Plavix through the day before surgery.  Do NOT take on day of surgery.  As of today, STOP taking any Aspirin (unless otherwise instructed by your surgeon) Aleve, Naproxen, Ibuprofen, Motrin, Advil, Goody's, BC's, all herbal medications, fish oil, and all vitamins.   WHAT DO I DO ABOUT MY DIABETES MEDICATION?   [name] Do not take oral diabetes medicines (pills) the morning of surgery. - Glimepiride (Amaryl)   HOW TO MANAGE YOUR DIABETES BEFORE AND AFTER SURGERY  Why is it important to control my blood sugar before and after surgery? . Improving blood sugar levels before and after surgery helps healing and can limit problems. . A way of improving blood sugar control is eating a healthy diet by: o  Eating less sugar and carbohydrates o  Increasing activity/exercise o  Talking with your doctor about reaching your blood sugar goals . High blood sugars (greater than 180 mg/dL) can raise your risk of infections and slow your recovery, so you will need to focus on controlling your diabetes during the weeks before surgery. . Make sure that the doctor who takes care of your diabetes knows about your planned surgery  including the date and location.  How do I manage my blood sugar before surgery? . Check your blood sugar at least 4 times a day, starting 2 days before surgery, to make sure that the level is not too high or low. . Check your blood sugar the morning of your surgery when you wake up and every 2 hours until you get to the Short Stay unit. o If your blood sugar is less than 70 mg/dL, you will need to treat for low blood sugar: - Do not take insulin. - Treat a low blood sugar (less than 70 mg/dL) with  cup of clear juice (cranberry or apple), 4 glucose tablets, OR glucose gel. - Recheck blood sugar in 15 minutes after treatment (to make sure it is greater than 70 mg/dL). If your blood sugar is not greater than 70 mg/dL on recheck, call [phone] for further instructions. . Report your blood sugar to the short stay nurse when you get to Short Stay.  . If you are admitted to the hospital after surgery: o Your blood sugar will be checked by the staff and you will probably be given insulin after surgery (instead of oral diabetes medicines) to make sure you have good blood sugar levels. o The goal for blood sugar control after surgery is 80-180 mg/dL.               DAY OF SURGERY:            Do not wear jewelry, make  up, or nail polish            Do not wear lotions, powders, perfumes, or deodorant.            Do not shave 48 hours prior to surgery.              Do not bring valuables to the hospital.            [facility] is not responsible for any belongings or valuables.  Do NOT Smoke (Tobacco/Vaping) or drink Alcohol 24 hours prior to your procedure If you use a CPAP at night, you may bring all equipment for your overnight stay.   Contacts, glasses, dentures or bridgework may not be worn into surgery.      For patients admitted to the hospital, discharge time will be determined by your treatment team.   Patients discharged the day of surgery will not be allowed to drive home, and  someone needs to stay with them for 24 hours.    Special instructions:   [facility]- Preparing For Surgery  Before surgery, you can play an important role. Because skin is not sterile, your skin needs to be as free of germs as possible. You can reduce the number of germs on your skin by washing with CHG (chlorahexidine gluconate) Soap before surgery.  CHG is an antiseptic cleaner which kills germs and bonds with the skin to continue killing germs even after washing.    Oral Hygiene is also important to reduce your risk of infection.  Remember - BRUSH YOUR TEETH THE MORNING OF SURGERY WITH YOUR REGULAR TOOTHPASTE  Please do not use if you have an allergy to CHG or antibacterial soaps. If your skin becomes reddened/irritated stop using the CHG.  Do not shave (including legs and underarms) for at least 48 hours prior to first CHG shower. It is OK to shave your face.  Please follow these instructions carefully.   1. Shower the NIGHT BEFORE SURGERY and the MORNING OF SURGERY with CHG Soap.   2. If you chose to wash your hair, wash your hair first as usual with your normal shampoo.  3. After you shampoo, rinse your hair and body thoroughly to remove the shampoo.  4. Use CHG as you would any other liquid soap. You can apply CHG directly to the skin and wash gently with a scrungie or a clean washcloth.   5. Apply the CHG Soap to your body ONLY FROM THE NECK DOWN.  Do not use on open wounds or open sores. Avoid contact with your eyes, ears, mouth and genitals (private parts). Wash Face and genitals (private parts)  with your normal soap.   6. Wash thoroughly, paying special attention to the area where your surgery will be performed.  7. Thoroughly rinse your body with warm water from the neck down.  8. DO NOT shower/wash with your normal soap after using and rinsing off the CHG Soap.  9. Pat yourself dry with a CLEAN TOWEL.  10. Wear CLEAN PAJAMAS to bed the night before  surgery  11. Place CLEAN SHEETS on your bed the night of your first shower and DO NOT SLEEP WITH PETS.   Day of Surgery: Wear Clean/Comfortable clothing the morning of surgery Do not apply any deodorants/lotions.   Remember to brush your teeth WITH YOUR REGULAR TOOTHPASTE.   Please read over the following fact sheets that you were given.

## 2020-09-24 ENCOUNTER — Encounter (HOSPITAL_COMMUNITY): Payer: Self-pay

## 2020-09-24 ENCOUNTER — Other Ambulatory Visit: Payer: Self-pay

## 2020-09-24 ENCOUNTER — Other Ambulatory Visit (HOSPITAL_COMMUNITY)
Admission: RE | Admit: 2020-09-24 | Discharge: 2020-09-24 | Disposition: A | Discharge status: Home / Self Care | Payer: Medicare Other | Source: Ambulatory Visit | Attending: Cardiovascular Disease | Admitting: Cardiovascular Disease

## 2020-09-24 ENCOUNTER — Ambulatory Visit (HOSPITAL_COMMUNITY)
Admission: RE | Admit: 2020-09-24 | Discharge: 2020-09-24 | Disposition: A | Discharge status: Home / Self Care | Payer: Medicare Other | Source: Ambulatory Visit | Attending: Cardiovascular Disease | Admitting: Cardiovascular Disease

## 2020-09-24 ENCOUNTER — Encounter (HOSPITAL_COMMUNITY)
Admission: RE | Admit: 2020-09-24 | Discharge: 2020-09-24 | Disposition: A | Discharge status: Home / Self Care | Payer: Medicare Other | Source: Ambulatory Visit | Attending: Cardiovascular Disease | Admitting: Cardiovascular Disease

## 2020-09-24 DIAGNOSIS — Z20822 Contact with and (suspected) exposure to covid-19: Secondary | ICD-10-CM | POA: Diagnosis not present

## 2020-09-24 DIAGNOSIS — I350 Nonrheumatic aortic (valve) stenosis: Secondary | ICD-10-CM | POA: Insufficient documentation

## 2020-09-24 DIAGNOSIS — R918 Other nonspecific abnormal finding of lung field: Secondary | ICD-10-CM | POA: Diagnosis not present

## 2020-09-24 DIAGNOSIS — Z01818 Encounter for other preprocedural examination: Secondary | ICD-10-CM | POA: Insufficient documentation

## 2020-09-24 LAB — CBC
HCT: 32.8 % — ABNORMAL LOW (ref 36.0–46.0)
Hemoglobin: 10.6 g/dL — ABNORMAL LOW (ref 12.0–15.0)
MCH: 28.9 pg (ref 26.0–34.0)
MCHC: 32.3 g/dL (ref 30.0–36.0)
MCV: 89.4 fL (ref 80.0–100.0)
Platelets: 268 10*3/uL (ref 150–400)
RBC: 3.67 MIL/uL — ABNORMAL LOW (ref 3.87–5.11)
RDW: 15.4 % (ref 11.5–15.5)
WBC: 6.4 10*3/uL (ref 4.0–10.5)
nRBC: 0 % (ref 0.0–0.2)

## 2020-09-24 LAB — URINALYSIS, ROUTINE W REFLEX MICROSCOPIC
Bacteria, UA: NONE SEEN
Bilirubin Urine: NEGATIVE
Glucose, UA: 50 mg/dL — AB
Hgb urine dipstick: NEGATIVE
Ketones, ur: NEGATIVE mg/dL
Nitrite: NEGATIVE
Protein, ur: NEGATIVE mg/dL
Specific Gravity, Urine: 1.02 (ref 1.005–1.030)
pH: 5 (ref 5.0–8.0)

## 2020-09-24 LAB — BLOOD GAS, ARTERIAL
Acid-Base Excess: 0.1 mmol/L (ref 0.0–2.0)
Bicarbonate: 24.4 mmol/L (ref 20.0–28.0)
FIO2: 21
O2 Saturation: 97.9 %
Patient temperature: 37
pCO2 arterial: 41 mmHg (ref 32.0–48.0)
pH, Arterial: 7.392 (ref 7.350–7.450)
pO2, Arterial: 106 mmHg (ref 83.0–108.0)

## 2020-09-24 LAB — COMPREHENSIVE METABOLIC PANEL
ALT: 28 U/L (ref 0–44)
AST: 34 U/L (ref 15–41)
Albumin: 3.3 g/dL — ABNORMAL LOW (ref 3.5–5.0)
Alkaline Phosphatase: 81 U/L (ref 38–126)
Anion gap: 11 (ref 5–15)
BUN: 13 mg/dL (ref 8–23)
CO2: 22 mmol/L (ref 22–32)
Calcium: 8.9 mg/dL (ref 8.9–10.3)
Chloride: 106 mmol/L (ref 98–111)
Creatinine, Ser: 1.08 mg/dL — ABNORMAL HIGH (ref 0.44–1.00)
GFR, Estimated: 47 mL/min — ABNORMAL LOW (ref >60)
Glucose, Bld: 173 mg/dL — ABNORMAL HIGH (ref 70–99)
Potassium: 4.1 mmol/L (ref 3.5–5.1)
Sodium: 139 mmol/L (ref 135–145)
Total Bilirubin: 1.3 mg/dL — ABNORMAL HIGH (ref 0.3–1.2)
Total Protein: 6.5 g/dL (ref 6.5–8.1)

## 2020-09-24 LAB — TYPE AND SCREEN
ABO/RH(D): AB POS
Antibody Screen: NEGATIVE

## 2020-09-24 LAB — GLUCOSE, CAPILLARY: Glucose-Capillary: 174 mg/dL — ABNORMAL HIGH (ref 70–99)

## 2020-09-24 LAB — PROTIME-INR
INR: 1 (ref 0.8–1.2)
Prothrombin Time: 13 seconds (ref 11.4–15.2)

## 2020-09-24 LAB — SURGICAL PCR SCREEN
MRSA, PCR: NEGATIVE
Staphylococcus aureus: NEGATIVE

## 2020-09-24 NOTE — Progress Notes (Signed)
HISTORY AND PHYSICAL     CC:  follow up. Requesting Provider:  [name], MD  HPI: This is a 87 y.o. female here for follow up for carotid artery stenosis.  Pt is s/p right CEA for symptomatic carotid artery stenosis on 2.2.2021 by Dr. Donzetta Matters.    Pt was last seen 11/28/2019 for her post operative visit and at that time she was doing well with some persistent swelling in her right neck.  Otherwise no complaints.    Pt returns today for follow up.  Pt denies any amaurosis fugax, speech difficulties, weakness, numbness, paralysis or clumsiness or facial droop.  She states that she was having some shortness of breath and had a cardiac stent placed last month and that is much improved.  She tells me that she is undergoing TAVR on Tuesday.   She denies any pain in her legs with walking.  She gets around the stores and at home.  She lives independently.  She states she wears hearing aides.   The pt is on a statin for cholesterol management.  The pt is not on a daily aspirin.   Other AC:  Plavix The pt is on BB for hypertension.   The pt is diabetic.   Tobacco hx:  never   Past Medical History:  Diagnosis Date  . Anxiety   . Carotid stenosis, right 10/16/2019   75% on CTA neck  . Cholelithiasis    on u/s 09/2011  . Degenerative joint disease    Right THA in 1995; cervical discectomy and fusion-2001  . Diabetes mellitus, type 2 (HCC)    + neuropathy  . Emphysema   . Gastroesophageal reflux disease   . Hiatal hernia   . Hyperlipidemia   . Hypertension   . IDA (iron deficiency anemia)    labs 09/2011  . Mild cognitive impairment   . Obesity   . Osteoporosis   . Pneumonia   . Severe aortic stenosis   . Sigmoid diverticulosis   . Small bowel lesion    On Given's capsule; Dr Kathleene Hazel 07/2012, no further w/u needed  . Stroke [facility])     Past Surgical History:  Procedure Laterality Date  . ANTERIOR CERVICAL DISCECTOMY  2001   C4-5, allograft, fixation  . BREAST  LUMPECTOMY     benign  . CATARACT EXTRACTION W/ INTRAOCULAR LENS IMPLANT  2007   Left  . COLONOSCOPY  2006  . COLONOSCOPY  04/03/12   Dr. Ok Edwards diverticulosis, negative microscopic  colitis   . CORONARY STENT INTERVENTION N/A 09/08/2020   Procedure: CORONARY STENT INTERVENTION;  Surgeon: Sherren Mocha, MD;  Location: [place] [facility];  Service: Cardiovascular;  Laterality: N/A;  . ENDARTERECTOMY Right 10/21/2019   Procedure: ENDARTERECTOMY CAROTID RIGHT;  Surgeon: Waynetta Sandy, MD;  Location: [place];  Service: Vascular;  Laterality: Right;  . ESOPHAGOGASTRODUODENOSCOPY  04/03/12   Dr. Jennet Maduro hernia, chronic gastritis on bx  . ESOPHAGOGASTRODUODENOSCOPY (EGD) WITH PROPOFOL N/A 04/28/2020   Procedure: ESOPHAGOGASTRODUODENOSCOPY (EGD) WITH PROPOFOL;  Surgeon: Otis Brace, MD;  Location: [place];  Service: Gastroenterology;  Laterality: N/A;  . GIVENS CAPSULE STUDY  05/09/2012   RMR: an unclear raised area of small bowel was noted, with features almost  characteristic of very small polyp. This was without villous  blunting or any evidence of active bleeding; yet, the area of  concern appeared to be erythematous. However, this could simply  be a light reflection on a normal variation of the small bowel. REFERRED  TO DR. Arsenio Loader, APPT FOR NOVEMBER 2013.   [name] GIVENS CAPSULE STUDY N/A 04/30/2020   Procedure: GIVENS CAPSULE STUDY;  Surgeon: Otis Brace, MD;  Location: [place];  Service: Gastroenterology;  Laterality: N/A;  . HIP ARTHROPLASTY Right   . PATCH ANGIOPLASTY Right 10/21/2019   Procedure: PATCH ANGIOPLASTY USING Rueben Bash BIOLOGIC PATCH;  Surgeon: Waynetta Sandy, MD;  Location: [place];  Service: Vascular;  Laterality: Right;  . POLYPECTOMY  04/28/2020   Procedure: POLYPECTOMY;  Surgeon: Otis Brace, MD;  Location: [place] [facility];  Service: Gastroenterology;;  . RIGHT/LEFT HEART CATH AND CORONARY ANGIOGRAPHY N/A 09/08/2020   Procedure:  RIGHT/LEFT HEART CATH AND CORONARY ANGIOGRAPHY;  Surgeon: Sherren Mocha, MD;  Location: [place] [facility];  Service: Cardiovascular;  Laterality: N/A;  . TONSILLECTOMY    . TOTAL HIP ARTHROPLASTY  1995   Right    No Known Allergies  Current Outpatient Medications  Medication Sig Dispense Refill  . ALPRAZolam (XANAX) 0.5 MG tablet Take 0.5 mg by mouth at bedtime as needed for anxiety.    . Ascorbic Acid (VITAMIN C PO) Take 1 tablet by mouth daily.    [name] atorvastatin (LIPITOR) 40 MG tablet Take 1 tablet (40 mg total) by mouth daily at 6 PM. 30 tablet 2  . cholecalciferol (VITAMIN D) 25 MCG (1000 UNIT) tablet Take 1,000 Units by mouth daily.    . clopidogrel (PLAVIX) 75 MG tablet Take 1 tablet (75 mg total) by mouth daily with breakfast. 90 tablet 3  . glimepiride (AMARYL) 1 MG tablet Take 1 mg by mouth daily with breakfast.    . Glycerin-Hypromellose-PEG 400 (VISINE DRY EYE OP) Apply 1 drop to eye 3 (three) times daily as needed (for dry eye relief).     . LUTEIN PO Take by mouth daily.    . meclizine (ANTIVERT) 12.5 MG tablet Take 1 tablet (12.5 mg total) by mouth 3 (three) times daily as needed for dizziness. 20 tablet 0  . metoprolol tartrate (LOPRESSOR) 25 MG tablet Take 0.5 tablets (12.5 mg total) by mouth 2 (two) times daily. 30 tablet 0  . Multiple Vitamins-Minerals (PRESERVISION AREDS 2 PO) Take 1 tablet by mouth See admin instructions. Takes 1 tablet in the morning and 1 tablet at night for eyes.    . pantoprazole (PROTONIX) 40 MG tablet Take 1 tablet (40 mg total) by mouth daily. 90 tablet 3  . vitamin E 180 MG (400 UNITS) capsule Take 400 Units by mouth daily. (Patient not taking: Reported on 09/23/2020)     No current facility-administered medications for this visit.    Family History  Problem Relation Age of Onset  . Cirrhosis Mother        no etoh use  . CAD Father   . Diabetes Mother        + brother  . Heart failure Father   . Hypertension Father        + brother  .  Cancer Brother        lung, age 24  . Uterine cancer Sister   . Colon cancer Neg Hx     Social History   Socioeconomic History  . Marital status: Divorced    Spouse name: Not on file  . Number of children: 4  . Years of education: 6th grade  . Highest education level: Not on file  Occupational History  . Occupation: Retired    Comment: [employer]  Tobacco Use  . Smoking status: Never Smoker  . Smokeless tobacco: Never  Used  . Tobacco comment: Quit x 50 years  Vaping Use  . Vaping Use: Never used  Substance and Sexual Activity  . Alcohol use: Never  . Drug use: Never  . Sexual activity: Not Currently  Other Topics Concern  . Not on file  Social History Narrative   Lives alone. Every other week she has family with her. Daughter and son take care of her during the days.    Right-handed.   No daily use of caffeine.             Social Determinants of Health   Financial Resource Strain: Not on file  Food Insecurity: Not on file  Transportation Needs: Not on file  Physical Activity: Not on file  Stress: Not on file  Social Connections: Not on file  Intimate Partner Violence: Not on file     REVIEW OF SYSTEMS:   [X]  denotes positive finding, [ ]  denotes negative finding Cardiac  Comments:  Chest pain or chest pressure:    Shortness of breath upon exertion:    Short of breath when lying flat:    Irregular heart rhythm:        Vascular    Pain in calf, thigh, or hip brought on by ambulation:    Pain in feet at night that wakes you up from your sleep:     Blood clot in your veins:    Leg swelling:         Pulmonary    Oxygen at home:    Productive cough:     Wheezing:         Neurologic    Sudden weakness in arms or legs:     Sudden numbness in arms or legs:     Sudden onset of difficulty speaking or slurred speech:    Temporary loss of vision in one eye:     Problems with dizziness:         Gastrointestinal    Blood in stool:     Vomited blood:          Genitourinary    Burning when urinating:     Blood in urine:        Psychiatric    Major depression:         Hematologic    Bleeding problems:    Problems with blood clotting too easily:        Skin    Rashes or ulcers:        Constitutional    Fever or chills:      PHYSICAL EXAMINATION:  Today's Vitals   09/23/20 1412 09/23/20 1416  BP: (!) 154/70   Pulse: 95   Resp: 18   Temp: (!) 97.2 F (36.2 C)   TempSrc: Temporal   SpO2: 95%   Weight: 170 lb 1.6 oz (77.2 kg)   Height: 5\' 5"  (1.651 m)   PainSc: 0-No pain 0-No pain   Body mass index is 28.31 kg/m.   General:  WDWN in NAD; vital signs documented above Gait: Not observed HENT: WNL, normocephalic Pulmonary: normal non-labored breathing Cardiac: regular HR, without murmur without carotid bruits Abdomen: soft, NT, no masses; aortic pulse is not palpable Skin: without rashes Vascular Exam/Pulses: Brisk doppler signals bilateral DP/PT with left better than right Palpable radial pulses bilaterally Extremities: without ischemic changes, without Gangrene , without cellulitis; without open wounds Musculoskeletal: no muscle wasting or atrophy  Neurologic: A&O X 3 Psychiatric:  The pt has Normal affect.  Non-Invasive Vascular Imaging:   Carotid Duplex on 09/23/2020 : Right:  1/39% ICA stenosis Left:  1-39% ICA stenosis    ASSESSMENT/PLAN:: 87 y.o. female here for follow up carotid artery stenosis and is s/p right CEA for symptomatic carotid artery stenosis on 2.2.2021 by Dr. Donzetta Matters.     -duplex today reveals 1-39% bilateral ICA stenosis and she has remained asymptomatic.   -she is scheduled for TAVR next week. -discussed s/s of stroke with pt and she understands should she develop any of these sx, she will go to the nearest ER. -pt will f/u in one year with carotid duplex -pt will call sooner should they have any issues. -continue statin/plavix   Leontine Locket, [facility] [phone]  Clinic MD:  Oneida Alar

## 2020-09-24 NOTE — Progress Notes (Signed)
PCP - Dr. Cardell Peach Cardiologist - Dr. Johnsie Cancel  PPM/ICD - n/a Device Orders -  Rep Notified -   Chest x-ray - 09/24/20 EKG - 09/13/20 Stress Test -  ECHO - 06/25/20 Cardiac Cath - 09/08/20  Sleep Study - n/a CPAP -   Fasting Blood Sugar - unknown Checks Blood Sugar 0 times a day  Blood Thinner Instructions: continue plavix, hold DOS Aspirin Instructions: n/a  ERAS Protcol - NPO after midnight PRE-SURGERY Ensure or G2-   COVID TEST- after PAT appointment 09/24/20    Anesthesia review: yes, recent cath and stent  Patient denies shortness of breath, fever, cough and chest pain at PAT appointment   All instructions explained to the patient, with a verbal understanding of the material. Patient agrees to go over the instructions while at home for a better understanding. Patient also instructed to self quarantine after being tested for COVID-19. The opportunity to ask questions was provided.

## 2020-09-25 LAB — SARS CORONAVIRUS 2 (TAT 6-24 HRS): SARS Coronavirus 2: NEGATIVE

## 2020-09-27 MED ORDER — POTASSIUM CHLORIDE 2 MEQ/ML IV SOLN
80.0000 meq | INTRAVENOUS | Status: DC
  Filled 2020-09-27: qty 40

## 2020-09-27 MED ORDER — MAGNESIUM SULFATE 50 % IJ SOLN
40.0000 meq | INTRAMUSCULAR | Status: DC
  Filled 2020-09-27: qty 9.85

## 2020-09-27 MED ORDER — DEXMEDETOMIDINE HCL IN NACL 400 MCG/100ML IV SOLN
0.1000 ug/kg/h | INTRAVENOUS | Status: AC
  Administered 2020-09-28: .7 ug/kg/h via INTRAVENOUS
  Administered 2020-09-28: 38.64 ug via INTRAVENOUS
  Filled 2020-09-27: qty 100

## 2020-09-27 MED ORDER — VANCOMYCIN HCL 1250 MG/250ML IV SOLN
1250.0000 mg | INTRAVENOUS | Status: AC
  Administered 2020-09-28: 1250 mg via INTRAVENOUS
  Filled 2020-09-27: qty 250

## 2020-09-27 MED ORDER — SODIUM CHLORIDE 0.9 % IV SOLN
INTRAVENOUS | Status: DC
  Filled 2020-09-27: qty 30

## 2020-09-27 MED ORDER — NOREPINEPHRINE 4 MG/250ML-% IV SOLN
0.0000 ug/min | INTRAVENOUS | Status: AC
  Administered 2020-09-28: 2 ug/min via INTRAVENOUS
  Filled 2020-09-27: qty 250

## 2020-09-27 MED ORDER — SODIUM CHLORIDE 0.9 % IV SOLN
1.5000 g | INTRAVENOUS | Status: AC
  Administered 2020-09-28: 1.5 g via INTRAVENOUS
  Filled 2020-09-27: qty 1.5

## 2020-09-27 NOTE — H&P (Signed)
[address]       [place] [address]             [phone]      Cardiothoracic Surgery Admission History and Physical   Referring Provider is [name], MD Primary Cardiologist is Jenkins Rouge, MD PCP is [name], MD      Chief Complaint  Patient presents with  . Aortic Stenosis        HPI:  The patient is a 87 year old woman with a history of hypertension, hyperlipidemia, type 2 diabetes, stroke in January 2021 followed by right carotid endarterectomy in February 2021, and aortic stenosis followed by Dr. Johnsie Cancel.  2D echocardiogram in October 2020 showed moderate aortic stenosis with a mean gradient of 20.7 mmHg and a valve area by VTI of 0.8 cm2.  Left ventricular ejection fraction was 60 to 65%.  Her most recent echocardiogram on 06/25/2020 showed a mean gradient of 32 mmHg with a peak gradient of 51 mmHg.  Aortic valve area by VTI was 0.6 cm2.  She had a difficult hospitalization in August 2021 when she was admitted with a GI bleed and demand ischemia and suffered a mechanical fall in the hospital with fracture of her left fibula.  She has made a slow but good recovery from this and feels that she is back at her baseline.  Prior to this she was very active going to [employer] games and was able to walk into the stadium.  After her fibula fracture she had to use a wheelchair for a while but is now back walking with a cane 4-prong cane.  She now reports progressive exertional shortness of breath and fatigue which has been present for a few years but has worsened over the last 6 months and is now occurring with low-level activity.  During her work-up for aortic stenosis and consideration of TAVR she underwent cardiac catheterization on 09/08/2020 which showed a 90% left circumflex stenosis and a large caliber vessel.  This was successfully treated with drug-eluting stent.  She said that since that procedure her  breathing has been much better.  Catheterization also showed a 60 to 70% distal RCA stenosis and mild nonobstructive LAD disease.  The mean gradient across aortic valve at catheterization was 57 mmHg with a valve area of 0.78 cm2.  She was continued on dual antiplatelet therapy but said that she is only taking Plavix at this time.  She was concerned about taking aspirin in addition to Plavix due to her GI bleeding in August . She denies any dizziness or syncope.  She has had no chest pain or pressure.  She has had no orthopnea.  She denies peripheral edema.  She lives independently by herself.  She takes care of her house.  She no longer drives due to macular degeneration.  She has 4 living children that she raised on her own and the take care of her meals.      Past Medical History:  Diagnosis Date  . Anxiety   . Carotid stenosis, right 10/16/2019   75% on CTA neck  . Cholelithiasis    on u/s 09/2011  . Degenerative joint disease    Right THA in 1995; cervical discectomy and fusion-2001  . Diabetes mellitus, type 2 (HCC)    + neuropathy  . Emphysema   . Gastroesophageal reflux disease   . Hiatal hernia   . Hyperlipidemia   . Hypertension   . IDA (iron deficiency  anemia)    labs 09/2011  . Mild cognitive impairment   . Obesity   . Osteoporosis   . Pneumonia   . Severe aortic stenosis   . Sigmoid diverticulosis   . Small bowel lesion    On Given's capsule; Dr Kathleene Hazel 07/2012, no further w/u needed  . Stroke [facility])          Past Surgical History:  Procedure Laterality Date  . ANTERIOR CERVICAL DISCECTOMY  2001   C4-5, allograft, fixation  . BREAST LUMPECTOMY     benign  . CATARACT EXTRACTION W/ INTRAOCULAR LENS IMPLANT  2007   Left  . COLONOSCOPY  2006  . COLONOSCOPY  04/03/12   Dr. Ok Edwards diverticulosis, negative microscopic  colitis   . CORONARY STENT INTERVENTION N/A 09/08/2020   Procedure: CORONARY STENT INTERVENTION;   Surgeon: Sherren Mocha, MD;  Location: [facility];  Service: Cardiovascular;  Laterality: N/A;  . ENDARTERECTOMY Right 10/21/2019   Procedure: ENDARTERECTOMY CAROTID RIGHT;  Surgeon: Waynetta Sandy, MD;  Location: [facility];  Service: Vascular;  Laterality: Right;  . ESOPHAGOGASTRODUODENOSCOPY  04/03/12   Dr. Jennet Maduro hernia, chronic gastritis on bx  . ESOPHAGOGASTRODUODENOSCOPY (EGD) WITH PROPOFOL N/A 04/28/2020   Procedure: ESOPHAGOGASTRODUODENOSCOPY (EGD) WITH PROPOFOL;  Surgeon: Otis Brace, MD;  Location: [facility];  Service: Gastroenterology;  Laterality: N/A;  . GIVENS CAPSULE STUDY  05/09/2012   RMR: an unclear raised area of small bowel was noted, with features almost  characteristic of very small polyp. This was without villous  blunting or any evidence of active bleeding; yet, the area of  concern appeared to be erythematous. However, this could simply  be a light reflection on a normal variation of the small bowel. REFERRED TO DR. GILLIAM, APPT FOR NOVEMBER 2013.   [name] GIVENS CAPSULE STUDY N/A 04/30/2020   Procedure: GIVENS CAPSULE STUDY;  Surgeon: Otis Brace, MD;  Location: [facility];  Service: Gastroenterology;  Laterality: N/A;  . HIP ARTHROPLASTY Right   . PATCH ANGIOPLASTY Right 10/21/2019   Procedure: PATCH ANGIOPLASTY USING Rueben Bash BIOLOGIC PATCH;  Surgeon: Waynetta Sandy, MD;  Location: [facility];  Service: Vascular;  Laterality: Right;  . POLYPECTOMY  04/28/2020   Procedure: POLYPECTOMY;  Surgeon: Otis Brace, MD;  Location: [facility];  Service: Gastroenterology;;  . RIGHT/LEFT HEART CATH AND CORONARY ANGIOGRAPHY N/A 09/08/2020   Procedure: RIGHT/LEFT HEART CATH AND CORONARY ANGIOGRAPHY;  Surgeon: Sherren Mocha, MD;  Location: [facility];  Service: Cardiovascular;  Laterality: N/A;  . TONSILLECTOMY    . TOTAL HIP ARTHROPLASTY  1995   Right         Family History  Problem Relation Age of Onset  .  Cirrhosis Mother        no etoh use  . CAD Father   . Diabetes Mother        + brother  . Heart failure Father   . Hypertension Father        + brother  . Cancer Brother        lung, age 36  . Uterine cancer Sister   . Colon cancer Neg Hx     Social History        Socioeconomic History  . Marital status: Divorced    Spouse name: Not on file  . Number of children: 4  . Years of education: 6th grade  . Highest education level: Not on file  Occupational History  . Occupation: Retired    Comment: [employer]  Tobacco  Use  . Smoking status: Never Smoker  . Smokeless tobacco: Never Used  . Tobacco comment: Quit x 50 years  Vaping Use  . Vaping Use: Never used  Substance and Sexual Activity  . Alcohol use: Never  . Drug use: Never  . Sexual activity: Not Currently  Other Topics Concern  . Not on file  Social History Narrative   Lives alone. Every other week she has family with her. Daughter and son take care of her during the days.    Right-handed.   No daily use of caffeine.             Social Determinants of Health   Financial Resource Strain: Not on file  Food Insecurity: Not on file  Transportation Needs: Not on file  Physical Activity: Not on file  Stress: Not on file  Social Connections: Not on file  Intimate Partner Violence: Not on file          Current Outpatient Medications  Medication Sig Dispense Refill  . ALPRAZolam (XANAX) 0.5 MG tablet Take 0.5 mg by mouth at bedtime as needed for anxiety.    . Ascorbic Acid (VITAMIN C PO) Take 1 tablet by mouth daily.    [name] atorvastatin (LIPITOR) 40 MG tablet Take 1 tablet (40 mg total) by mouth daily at 6 PM. 30 tablet 2  . cholecalciferol (VITAMIN D) 25 MCG (1000 UNIT) tablet Take 1,000 Units by mouth daily.    . clopidogrel (PLAVIX) 75 MG tablet Take 1 tablet (75 mg total) by mouth daily with breakfast. 90 tablet 3  . glimepiride (AMARYL) 1 MG tablet Take 1 mg by mouth daily with  breakfast.    . Glycerin-Hypromellose-PEG 400 (VISINE DRY EYE OP) Apply 1 drop to eye 3 (three) times daily as needed (for dry eye relief).     . LUTEIN PO Take by mouth daily.    . meclizine (ANTIVERT) 12.5 MG tablet Take 1 tablet (12.5 mg total) by mouth 3 (three) times daily as needed for dizziness. 20 tablet 0  . metoprolol tartrate (LOPRESSOR) 25 MG tablet Take 0.5 tablets (12.5 mg total) by mouth 2 (two) times daily. 30 tablet 0  . Multiple Vitamins-Minerals (PRESERVISION AREDS 2 PO) Take 1 tablet by mouth See admin instructions. Takes 1 tablet in the morning and 1 tablet at night for eyes.    . pantoprazole (PROTONIX) 40 MG tablet Take 1 tablet (40 mg total) by mouth daily. 90 tablet 3  . vitamin E 180 MG (400 UNITS) capsule Take 400 Units by mouth daily.     No current facility-administered medications for this visit.    No Known Allergies    Review of Systems:              General:                      normal appetite, + decreased energy, no weight gain, no weight loss, no fever             Cardiac:                       no chest pain with exertion, no chest pain at rest, +SOB with low level exertion, no resting SOB, no PND, no orthopnea, no palpitations, no arrhythmia, no atrial fibrillation, no LE edema, no dizzy spells, no syncope             Respiratory:                 +  exertional shortness of breath, no home oxygen, no productive cough, no dry cough, no bronchitis, no wheezing, no hemoptysis, no asthma, no pain with inspiration or cough, no sleep apnea, no CPAP at night             GI:                               no difficulty swallowing, no reflux, no frequent heartburn, no hiatal hernia, no abdominal pain, no constipation, no diarrhea, no hematochezia, no hematemesis, no melena             GU:                              no dysuria,  + frequency, no urinary tract infection, no hematuria, no enlarged prostate, no kidney stones, no kidney disease              Vascular:                     no pain suggestive of claudication, no pain in feet, + leg cramps, no varicose veins, no DVT, no non-healing foot ulcer             Neuro:                         + stroke, + TIA's, no seizures, no headaches, no temporary blindness one eye,  no slurred speech, no peripheral neuropathy, no chronic pain, no instability of gait, no memory/cognitive dysfunction             Musculoskeletal:         + arthritis, no joint swelling, no myalgias, no difficulty walking, normal mobility for age             Skin:                            no rash, no itching, no skin infections, no pressure sores or ulcerations             Psych:                         no anxiety, no depression, no nervousness, no unusual recent stress             Eyes:                           + blurry vision, no floaters, + recent vision changes, + wears glasses              ENT:                            no hearing loss, no loose or painful teeth, + dentures           Hematologic:                        no easy bruising, no abnormal bleeding, no clotting disorder, no frequent epistaxis             Endocrine:                   + diabetes,  does check CBG's at home                                                       Physical Exam:              BP (!) 127/51   Pulse 70   Resp 20   Ht 5' 5"  (1.651 m)   Wt 169 lb (76.7 kg)   SpO2 97% Comment: RA  BMI 28.12 kg/m              General:                      Elderly but spunky and  well-appearing             HEENT:                       Unremarkable, NCAT, PERLA, EOMI,              Neck:                           no JVD, no bruits, no adenopathy              Chest:                          clear to auscultation, symmetrical breath sounds, no wheezes, no rhonchi              CV:                              RRR, grade lll/VI crescendo/decrescendo murmur heard best at RSB,  no diastolic murmur             Abdomen:                    soft, non-tender, no  masses              Extremities:                 warm, well-perfused, pulses palpable at ankle, no LE edema             Rectal/GU                   Deferred             Neuro:                         Grossly non-focal and symmetrical throughout             Skin:                            Clean and dry, no rashes, no breakdown   Diagnostic Tests:  ECHOCARDIOGRAM REPORT       Patient Name:  NIKETA TURNER Date of Exam: 06/25/2020  Medical Rec #: [id number]     Height:    65.0 in  Accession #:  [id number]     Weight:    165.0 lb  Date of Birth: 08-27-1923  BSA:     1.823 m  Patient Age:  89 years      BP:      128/76 mmHg  Patient Gender: F         HR:      81 bpm.  Exam Location: [facility]   Procedure: 2D Echo, Cardiac Doppler and Color Doppler   Indications:  Aortic Stenosis 424.1 / 135.0    History:    Patient has prior history of Echocardiogram examinations,  most         recent 04/26/2020. TIA, Aortic Valve Disease; Risk         Factors:Hypertension, Diabetes and Dyslipidemia.    Sonographer:  Alvino Chapel RCS  Referring Phys: [place]    1. Left ventricular ejection fraction, by estimation, is 60 to 65%. The  left ventricle has normal function. The left ventricle has no regional  wall motion abnormalities. There is mild left ventricular hypertrophy.  Left ventricular diastolic parameters  are consistent with Grade I diastolic dysfunction (impaired relaxation).  2. Right ventricular systolic function is normal. The right ventricular  size is normal. There is normal pulmonary artery systolic pressure. The  estimated right ventricular systolic pressure is 70.0 mmHg.  3. Left atrial size was mildly dilated.  4. Right atrial size was mildly dilated.  5. The mitral valve is degenerative. Mild mitral valve regurgitation.  Moderate mitral annular  calcification.  6. The aortic valve is tricuspid. There is severe calcifcation of the  aortic valve. Aortic valve regurgitation is trivial. Moderate to severe  aortic valve stenosis. Aortic valve mean gradient measures 32.0 mmHg.  Aortic valve Vmax measures 3.57 m/s.  Dimentionless index 0.34.  7. The inferior vena cava is normal in size with greater than 50%  respiratory variability, suggesting right atrial pressure of 3 mmHg.   FINDINGS  Left Ventricle: Left ventricular ejection fraction, by estimation, is 60  to 65%. The left ventricle has normal function. The left ventricle has no  regional wall motion abnormalities. The left ventricular internal cavity  size was normal in size. There is  mild left ventricular hypertrophy. Left ventricular diastolic parameters  are consistent with Grade I diastolic dysfunction (impaired relaxation).   Right Ventricle: The right ventricular size is normal. No increase in  right ventricular wall thickness. Right ventricular systolic function is  normal. There is normal pulmonary artery systolic pressure. The tricuspid  regurgitant velocity is 2.76 m/s, and  with an assumed right atrial pressure of 3 mmHg, the estimated right  ventricular systolic pressure is 17.4 mmHg.   Left Atrium: Left atrial size was mildly dilated.   Right Atrium: Right atrial size was mildly dilated.   Pericardium: There is no evidence of pericardial effusion.   Mitral Valve: The mitral valve is degenerative in appearance. Moderate  mitral annular calcification. Mild mitral valve regurgitation.   Tricuspid Valve: The tricuspid valve is grossly normal. Tricuspid valve  regurgitation is mild.   Aortic Valve: The aortic valve is tricuspid. There is severe calcifcation  of the aortic valve. Aortic valve regurgitation is trivial. Moderate to  severe aortic stenosis is present. Aortic valve mean gradient measures  32.0 mmHg. Aortic valve peak gradient  measures 51.0  mmHg. Aortic valve area, by VTI measures 0.60 cm.   Pulmonic Valve: The pulmonic valve was grossly normal. Pulmonic valve  regurgitation is trivial.   Aorta: The aortic root is normal in size and structure.   Venous: The inferior vena  cava is normal in size with greater than 50%  respiratory variability, suggesting right atrial pressure of 3 mmHg.   IAS/Shunts: No atrial level shunt detected by color flow Doppler.     LEFT VENTRICLE  PLAX 2D  LVIDd:     4.19 cm  LVIDs:     2.66 cm  LV PW:     1.30 cm  LV IVS:    1.22 cm  LVOT diam:   1.50 cm  LV SV:     56  LV SV Index:  31  LVOT Area:   1.77 cm     RIGHT VENTRICLE  RV S prime:   10.20 cm/s  TAPSE (M-mode): 1.9 cm   LEFT ATRIUM      Index    RIGHT ATRIUM      Index  LA diam:   3.70 cm 2.03 cm/m RA Area:   21.20 cm  LA Vol (A4C): 66.9 ml 36.70 ml/m RA Volume:  66.50 ml 36.48 ml/m  AORTIC VALVE  AV Area (Vmax):  0.65 cm  AV Area (Vmean):  0.62 cm  AV Area (VTI):   0.60 cm  AV Vmax:      357.00 cm/s  AV Vmean:     269.000 cm/s  AV VTI:      0.942 m  AV Peak Grad:   51.0 mmHg  AV Mean Grad:   32.0 mmHg  LVOT Vmax:     130.50 cm/s  LVOT Vmean:    93.950 cm/s  LVOT VTI:     0.318 m  LVOT/AV VTI ratio: 0.34    AORTA  Ao Root diam: 2.80 cm   MITRAL VALVE        TRICUSPID VALVE  MV Area (PHT): 2.65 cm   TR Peak grad:  30.5 mmHg  MV Decel Time: 286 msec   TR Vmax:    276.00 cm/s  MV E velocity: 172.67 cm/s  MV A velocity: 186.00 cm/s SHUNTS  MV E/A ratio: 0.93     Systemic VTI: 0.32 m               Systemic Diam: 1.50 cm   Rozann Lesches MD  Electronically signed by Rozann Lesches MD  Signature Date/Time: 06/25/2020/4:51:26 PM     Physicians  Panel Physicians Referring Physician Case Authorizing Physician  Sherren Mocha, MD (Primary)       Procedures  CORONARY STENT INTERVENTION  RIGHT/LEFT HEART CATH AND CORONARY ANGIOGRAPHY   Conclusion  1. Severe 90% left circumflex stenosis (large caliber vessel) with successful PCI using a 3.5 x 12 mm resolute Onyx DES 2. Moderate to severe distal RCA stenosis of 60 to 70%, recommend medical therapy 3. Mild nonobstructive LAD stenosis 4. Severe aortic stenosis with mean gradient 57 mmHg and aortic valve area 0.78 cm  Recommendations: Dual antiplatelet therapy with aspirin and clopidogrel x6 months. Continue Cangrelor infusion x2 hours then loaded with clopidogrel 600 mg x 1. Overnight observation in this elderly patient with severe aortic stenosis post PCI. Continue outpatient TAVR evaluation.   Recommendations  Antiplatelet/Anticoag Recommend uninterrupted dual antiplatelet therapy with Aspirin 9m daily and Clopidogrel 748mdaily for a minimum of 6 months (stable ischemic heart disease-Class I recommendation).   Indications  Severe aortic stenosis [I35.0 (ICD-10-CM)]   Procedural Details  Technical Details INDICATION: Severe symptomatic aortic stenosis with class III symptoms.  PROCEDURAL DETAILS: The right antecubital fossa is prepped, draped, and anesthetized with 1% lidocaine. Using ultrasound guidance, the right basilic vein is accessed without  difficulty. Ultrasound images are digitally captured and stored in the patient's chart. A 4/5 French slender sheath is inserted. Attention was then turned to the right wrist. The right wrist was then prepped, draped, and anesthetized with 1% lidocaine. Using ultrasound guidance a 5/6 French Slender sheath was placed in the right radial artery. Ultrasound images were again captured and stored in the patient's chart. Intra-arterial verapamil was administered through the radial artery sheath. IV heparin was administered after a JR4 catheter was advanced into the central aorta. A Swan-Ganz catheter was used  for the right heart catheterization. Standard protocol was followed for recording of right heart pressures and sampling of oxygen saturations. Fick cardiac output was calculated. Standard Judkins catheters were used for selective coronary angiography. LV pressure is recorded and an aortic valve pullback is performed. An AL-1 catheter and a straight tip wire are required to cross the aortic valve. PCI is performed after the diagnostic procedure. The patient is given additional heparin and a therapeutic ACT greater than 250 seconds is achieved. IV Cangrelor is initiated via bolus and drip protocol. The patient will be loaded with clopidogrel at the completion of the cangrelor infusion. An EBU 3.5 cm guide catheter is used to access the left circumflex. See PCI note for further details. There were no immediate procedural complications. The patient was transferred to the post catheterization recovery area for further monitoring.    Estimated blood loss <50 mL.   During this procedure medications were administered to achieve and maintain moderate conscious sedation while the patient's heart rate, blood pressure, and oxygen saturation were continuously monitored and I was present face-to-face 100% of this time.   Medications (Filter: Administrations occurring from 1027 to 1202 on 09/08/20) (important) Continuous medications are totaled by the amount administered until 09/08/20 1202.    Heparin (Porcine) in NaCl 1000-0.9 UT/500ML-% SOLN (mL) Total volume:  1,000 mL  Date/Time Rate/Dose/Volume Action   09/08/20 1040 500 mL Given   1040 500 mL Given    fentaNYL (SUBLIMAZE) injection (mcg) Total dose:  50 mcg  Date/Time Rate/Dose/Volume Action   09/08/20 1056 25 mcg Given   1127 25 mcg Given    midazolam (VERSED) injection (mg) Total dose:  2 mg  Date/Time Rate/Dose/Volume Action   09/08/20 1056 1 mg Given   1127 1 mg Given    lidocaine (PF) (XYLOCAINE) 1 %  injection (mL) Total volume:  4 mL  Date/Time Rate/Dose/Volume Action   09/08/20 1058 2 mL Given   1104 2 mL Given    Radial Cocktail/Verapamil only (mL) Total volume:  10 mL  Date/Time Rate/Dose/Volume Action   09/08/20 1104 10 mL Given    heparin sodium (porcine) injection (Units) Total dose:  7,000 Units  Date/Time Rate/Dose/Volume Action   09/08/20 1109 4,000 Units Given   1124 3,000 Units Given    cangrelor [facility]) bolus via infusion (mcg/kg) Total dose:  2,313 mcg Dosing weight:  77.1  Date/Time Rate/Dose/Volume Action   09/08/20 1134 2,313 mcg Given    cangrelor (KENGREAL) 50,000 mcg in sodium chloride 0.9 % 250 mL (200 mcg/mL) infusion (mcg/kg/min) Total dose:  Cannot be calculated* Dosing weight:  77.1  *Continuous medication not stopped within the calculation time range. Date/Time Rate/Dose/Volume Action   09/08/20 1136 4 mcg/kg/min - 92.5 mL/hr New Bag/Given    iohexol (OMNIPAQUE) 350 MG/ML injection (mL) Total volume:  85 mL  Date/Time Rate/Dose/Volume Action   09/08/20 1155 85 mL Given    Sedation Time  Sedation Time Physician-1: 48  minutes 50 seconds   Contrast  Medication Name Total Dose  iohexol (OMNIPAQUE) 350 MG/ML injection 85 mL    Radiation/Fluoro  Fluoro time: 6.6 (min) DAP: 17965 (mGycm2) Cumulative Air Kerma: 381 (mGy)   Coronary Findings   Diagnostic Dominance: Right  Left Anterior Descending  Vessel is moderate in size. There is mild diffuse disease throughout the vessel. The LAD exhibits diffuse nonobstructive disease. The LAD and circumflex have nearly separate ostia with a very short left main stem. The diagonal branches are small in caliber but patent without significant stenosis.  Left Circumflex  Vessel is very large. The circumflex is a very large caliber vessel. The vessel has a tight 90% focal stenosis in its midportion. The vessel then goes on to supply a very large obtuse  marginal branch that supplies multiple side branches to the posterior lateral wall. There are no other high-grade stenoses.  Ost Cx lesion is 30% stenosed.  Mid Cx lesion is 90% stenosed. The lesion is moderately calcified.  Right Coronary Artery  The RCA is dominant. The vessel has a moderate 60 to 70% stenosis in its mid to distal portion. There is diffuse plaquing without any critical stenoses identified. The PDA is relatively large in caliber with no stenosis.  Dist RCA lesion is 70% stenosed. The lesion is moderately calcified.   Intervention   Mid Cx lesion  Stent  CATH LAUNCHER 6FR EBU3.5 guide catheter was inserted. Lesion crossed with guidewire using a WIRE COUGAR XT STRL 190CM. Pre-stent angioplasty was performed using a BALLOON SAPPHIRE 2.5X12. A drug-eluting stent was successfully placed using a STENT RESOLUTE ONYX 3.5X12. Maximum pressure: 16 atm. Post-stent angioplasty was performed using a BALLOON SAPPHIRE [place] 3.75X10. Maximum pressure: 16 atm.  Post-Intervention Lesion Assessment  The intervention was successful. Pre-interventional TIMI flow is 3. Post-intervention TIMI flow is 3. No complications occurred at this lesion.  There is a 0% residual stenosis post intervention.   Left Heart  Aortic Valve There is severe aortic valve stenosis. The aortic valve is calcified. There is restricted aortic valve motion. Mean transaortic valve gradient is 57 mmHg, peak to peak gradient is 57 mmHg, peak instantaneous gradient is 87 mmmmHg, calculated aortic valve area of 0.78 cm   Coronary Diagrams   Diagnostic Dominance: Right    Intervention     Implants       Permanent Stent   Stent Resolute Onyx 3.5x12 - [id number] - Implanted  Inventory item: Loma Sousa 3.5X12 Model/Cat number: [id number]  Manufacturer: [place] Lot number: [id number]  Device identifier: [id number] Device identifier type: GS1  Area Of Implantation:  Mid CFX     GUDID Information  Request status Successful    Brand name: Resolute OnyxT Version/Model: [id number]  Company name: [employer] MRI safety info as of 09/08/20: MR Conditional  Contains dry or latex rubber: No    GMDN P.T. name: Drug-eluting coronary artery stent, non-bioabsorbable-polymer-coated     As of 09/08/2020  Status: Implanted       Syngo Images  Show images for CARDIAC CATHETERIZATION  Images on Long Term Storage  Show images for Annaleese, Guier P  Link to Procedure Log  Procedure Log     Hemo Data  Flowsheet Row Most Recent Value  Fick Cardiac Output 5.51 L/min  Fick Cardiac Output Index 2.98 (L/min)/BSA  Aortic Mean Gradient 56.57 mmHg  Aortic Peak Gradient 57 mmHg  Aortic Valve Area 0.78  Aortic Value Area Index 0.42 cm2/BSA  RA A Wave 6 mmHg  RA V Wave 2 mmHg  RA Mean 1 mmHg  RV Systolic Pressure 40 mmHg  RV Diastolic Pressure 0 mmHg  RV EDP 6 mmHg  PA Systolic Pressure 41 mmHg  PA Diastolic Pressure 7 mmHg  PA Mean 22 mmHg  PW A Wave 15 mmHg  PW V Wave 21 mmHg  PW Mean 9 mmHg  AO Systolic Pressure 161 mmHg  AO Diastolic Pressure 57 mmHg  AO Mean 93 mmHg  LV Systolic Pressure 096 mmHg  LV Diastolic Pressure 0 mmHg  LV EDP 15 mmHg  AOp Systolic Pressure 045 mmHg  AOp Diastolic Pressure 46 mmHg  AOp Mean Pressure 79 mmHg  LVp Systolic Pressure 409 mmHg  LVp Diastolic Pressure 0 mmHg  LVp EDP Pressure 8 mmHg  QP/QS 1  TPVR Index 7.36 HRUI  TSVR Index 31.14 HRUI  PVR SVR Ratio 0.14  TPVR/TSVR Ratio 0.24   ADDENDUM REPORT: 09/21/2020 14:21  CLINICAL DATA: Aortic stenosis  EXAM: Cardiac TAVR CT  TECHNIQUE: The patient was scanned on a Siemens Force 811 slice scanner. A 120 kV retrospective scan was triggered in the descending thoracic aorta at 111 HU's. Gantry rotation speed was 270 msecs and collimation was .9 mm. No beta blockade or nitro were given. The 3D data set  was reconstructed in 5% intervals of the R-R cycle. Systolic and diastolic phases were analyzed on a dedicated work station using MPR, MIP and VRT modes. The patient received 80 cc of contrast.  FINDINGS: Aortic Valve: Moderately calcified non coronary cusp with thickened leaflet edges tri leaflet valve Aortic calcium score 1339  Aorta: Mild atherosclerosis arch and great vessels not visualized  Sinotubular Junction: 22 mm  Ascending Thoracic Aorta: 31 mm  Aortic Arch: Not visualized  Descending Thoracic Aorta: 22 mm  Sinus of Valsalva Measurements:  Non-coronary: 26.6 mm  Right - coronary: 24.2 mm  Left - coronary: 25.4 mm  Coronary Artery Height above Annulus:  Left Main: 11.8 mm above annulus  Right Coronary: 10.8 mm above annulus  Virtual Basal Annulus Measurements:  Maximum/Minimum Diameter: 20.9 mm x 16.7 mm  Perimeter: 61 mm c  Area: 279 mm 2  Coronary Arteries: Sufficient height above annulus for deployment  Optimum Fluoroscopic Angle for Delivery: LAO 26 Caudal 1 degree  IMPRESSION: 1. Tri-leaflet AV? Rheumatic with small annulus area 279 mm2 best suited for a 23 mm Medtronic Evolut Pro valve  2. Coronary arteries sufficient height above annulus for deployment  3. Normal aortic root 3.1 cm  4. Optimum angiographic angle for deployment LAO 26 Caudal 1 degree  5. AV calcium score 1339  Jenkins Rouge   Electronically Signed By: Jenkins Rouge M.D. On: 09/21/2020 14:21   Addended by Josue Hector, MD on 09/21/2020 2:24 PM    Study Result  Narrative & Impression  EXAM: OVER-READ INTERPRETATION CT CHEST  The following report is an over-read performed by radiologist Dr. Vinnie Langton of [facility], [place] on 09/21/2020. This over-read does not include interpretation of cardiac or coronary anatomy or pathology. The coronary calcium score/coronary CTA interpretation by the cardiologist is  attached.  COMPARISON: Chest CT 10/05/2019.  FINDINGS: Extracardiac findings will be described separately under dictation for contemporaneously obtained CTA chest, abdomen and pelvis.  IMPRESSION: Please see separate dictation for contemporaneously obtained CTA chest, abdomen and pelvis dated 09/21/2020 for full description of relevant extracardiac findings.  Electronically Signed: By: Vinnie Langton M.D. On: 09/21/2020 11:22     Narrative & Impression  CLINICAL DATA: 87 year old female with  history of severe aortic stenosis. Preprocedural study prior to potential transcatheter aortic valve replacement (TAVR) procedure.  EXAM: CT ANGIOGRAPHY CHEST, ABDOMEN AND PELVIS  TECHNIQUE: Non-contrast CT of the chest was initially obtained.  Multidetector CT imaging through the chest, abdomen and pelvis was performed using the standard protocol during bolus administration of intravenous contrast. Multiplanar reconstructed images and MIPs were obtained and reviewed to evaluate the vascular anatomy.  CONTRAST: 155m OMNIPAQUE IOHEXOL 350 MG/ML SOLN  COMPARISON: CT of the abdomen and pelvis 04/28/2020. Chest CT 10/05/2019.  FINDINGS: CTA CHEST FINDINGS  Cardiovascular: Heart size is normal. There is no significant pericardial fluid, thickening or pericardial calcification. There is aortic atherosclerosis, as well as atherosclerosis of the great vessels of the mediastinum and the coronary arteries, including calcified atherosclerotic plaque in the left main, left anterior descending and left circumflex coronary arteries. Coronary artery stent in the left circumflex coronary artery. Severe thickening calcification of the aortic valve. Moderate calcifications of the mitral annulus.  Mediastinum/Lymph Nodes: No pathologically enlarged mediastinal or hilar lymph nodes. Small hiatal hernia. No axillary lymphadenopathy.  Lungs/Pleura: 3 mm pulmonary nodule in  the left lower lobe (axial image 67 of series 16) stable compared to prior examinations, considered definitively benign. Small calcified granuloma in the right lower lobe. No other larger more suspicious appearing pulmonary nodules or masses are noted. Bilateral apical pleuroparenchymal thickening and nodular architectural distortion, most compatible with areas of chronic post infectious or inflammatory scarring. No acute consolidative airspace disease. No pleural effusions.  Musculoskeletal/Soft Tissues: Orthopedic fixation hardware from ACDF in the cervical spine. There are no aggressive appearing lytic or blastic lesions noted in the visualized portions of the skeleton.  CTA ABDOMEN AND PELVIS FINDINGS  Hepatobiliary: Liver has a shrunken appearance and nodular contour, of cirrhosis. Small calcified granuloma in the periphery of the right lower lobe. No other suspicious cystic or solid hepatic lesions. No intra or extrahepatic biliary ductal dilatation. Large noncalcified gallstone measuring up to 3.1 cm in diameter lying dependently in the gallbladder. No findings to suggest an acute cholecystitis at this time.  Pancreas: No pancreatic mass. No pancreatic ductal dilatation. No pancreatic or peripancreatic fluid collections or inflammatory changes.  Spleen: Unremarkable.  Adrenals/Urinary Tract: In the posterolateral aspect of the interpolar region of the right kidney there is a 1.2 cm high attenuation lesion (79 HU), which is incompletely characterized on today's examination, but stable over numerous prior studies, presumably a small proteinaceous/hemorrhagic cyst. Other low-attenuation lesions in both kidneys are noted, largest of which are compatible with simple cysts, largest of which is exophytic in the upper pole the left kidney measuring 2.8 x 2.5 cm. Other subcentimeter low-attenuation lesions in both kidneys, too small to characterize, but statistically likely  to represent tiny cysts. No hydroureteronephrosis. Urinary bladder is normal in appearance. Bilateral adrenal glands are normal in appearance.  Stomach/Bowel: Normal appearance of the stomach. No pathologic dilatation of small bowel or colon. Numerous colonic diverticulae are noted, particularly in the sigmoid colon, without surrounding inflammatory changes to suggest an acute diverticulitis at this time. Normal appendix.  Vascular/Lymphatic: Aortic atherosclerosis, without evidence of aneurysm or dissection in the abdominal or pelvic vasculature. Vascular findings and measurements pertinent to potential TAVR procedure, as detailed below. No lymphadenopathy identified in the abdomen or pelvis.  Reproductive: Uterus and ovaries are unremarkable in appearance.  Other: No significant volume of ascites. No pneumoperitoneum.  Musculoskeletal: Status post right hip arthroplasty. There are no aggressive appearing lytic or blastic lesions noted in the visualized  portions of the skeleton.  VASCULAR MEASUREMENTS PERTINENT TO TAVR:  AORTA:  Minimal Aortic Diameter-12 x 11 mm  Severity of Aortic Calcification-moderate to severe  RIGHT PELVIS:  Right Common Iliac Artery -  Minimal Diameter-6.5 x 5.5 mm  Tortuosity-mild  Calcification-moderate  Right External Iliac Artery -  Minimal Diameter-5.6 x 5.7 mm  Tortuosity - mild  Calcification-none  Right Common Femoral Artery -  Minimal Diameter-6.1 x 6.2 mm  Tortuosity - mild  Calcification-mild  LEFT PELVIS:  Left Common Iliac Artery -  Minimal Diameter-6.7 x 6.8 mm  Tortuosity - mild  Calcification-moderate  Left External Iliac Artery -  Minimal Diameter-6.5 x 6.4 mm  Tortuosity-mild-to-moderate  Calcification-none  Left Common Femoral Artery -  Minimal Diameter-6.1 x 5.1 mm  Tortuosity - mild  Calcification-mild  Review of the MIP images confirms the above  findings.  IMPRESSION: 1. Vascular findings and measurements pertinent to potential TAVR procedure, as detailed above. 2. Severe thickening and calcification of the aortic valve, compatible with reported clinical history of severe aortic stenosis. 3. Aortic atherosclerosis, in addition to left main and 2 vessel coronary artery disease. Status post PTC I in the left circumflex coronary artery. 4. Morphologic changes in the liver compatible with underlying cirrhosis. 5. Cholelithiasis without evidence of acute cholecystitis at this time. 6. Colonic diverticulosis without evidence of acute diverticulitis. 7. Additional incidental findings, as above.   Electronically Signed By: Vinnie Langton M.D. On: 09/21/2020 13:13     Procedure: AVR + CAB  Risk of Mortality:  7.356%  Renal Failure:  5.840%  Permanent Stroke:  8.678%  Prolonged Ventilation:  22.992%  DSW Infection:  0.230%  Reoperation:  6.804%  Morbidity or Mortality:  30.584%  Short Length of Stay:  5.250%  Long Length of Stay:  23.973%    Impression:  This 87 year old woman presents with stage D, severe, symptomatic aortic stenosis with New York Heart Association class III symptoms of exertional fatigue and shortness of breath consistent with chronic diastolic congestive heart failure.  I have personally reviewed her 2D echocardiogram, cardiac catheterization, and CTA studies.  Her echocardiogram shows a trileaflet aortic valve with severe calcification and restricted mobility.  The mean gradient was measured at 32 mmHg with a peak gradient of 51 mmHg.  Aortic valve area by VTI was 0.6 cm.  Left ventricular ejection fraction was 60 to 65%.  Cardiac catheterization showed a high-grade left circumflex stenosis that was successfully treated with a drug-eluting stent on 09/08/2020.  She has been maintained on Plavix since then.  Cardiac catheterization showed a mean gradient across aortic valve of 57 mmHg  consistent with severe aortic stenosis.  She said that she has much improved after stenting of her left circumflex coronary artery.  She is not a candidate for open surgical AVR but I think would be a reasonable candidate for transcatheter aortic valve replacement given her activity level and independence at 87 years old.  Her gated cardiac CTA shows a relatively small aortic annulus of 279 mm that is suitable for a 23 mm Medtronic Evolut Pro valve.  Her abdominal and pelvic CTA shows adequate pelvic vascular anatomy to allow transfemoral insertion.  The patient and her daughter were counseled at length regarding treatment alternatives for management of severe symptomatic aortic stenosis. The risks and benefits of surgical intervention has been discussed in detail. Long-term prognosis with medical therapy was discussed. Alternative approaches such as conventional surgical aortic valve replacement, transcatheter aortic valve replacement, and palliative medical therapy were compared  and contrasted at length. This discussion was placed in the context of the patient's own specific clinical presentation and past medical history. All of their questions have been addressed.   Following the decision to proceed with transcatheter aortic valve replacement, a discussion was held regarding what types of management strategies would be attempted intraoperatively in the event of life-threatening complications, including whether or not the patient would be considered a candidate for the use of cardiopulmonary bypass and/or conversion to open sternotomy for attempted surgical intervention.  At 87 years old I do not think she is a candidate for emergent sternotomy to manage any intraoperative complications.  The patient is aware of the fact that transient use of cardiopulmonary bypass may be necessary. The patient has been advised of a variety of complications that might develop including but not limited to risks of death,  stroke, paravalvular leak, aortic dissection or other major vascular complications, aortic annulus rupture, device embolization, cardiac rupture or perforation, mitral regurgitation, acute myocardial infarction, arrhythmia, heart block or bradycardia requiring permanent pacemaker placement, congestive heart failure, respiratory failure, renal failure, pneumonia, infection, other late complications related to structural valve deterioration or migration, or other complications that might ultimately cause a temporary or permanent loss of functional independence or other long term morbidity. The patient provides full informed consent for the procedure as described and all questions were answered.     Plan:  Transfemoral transcatheter aortic valve placement using a Medtronic Evolut Pro valve.   Gaye Pollack, MD

## 2020-09-27 NOTE — Anesthesia Preprocedure Evaluation (Addendum)
Anesthesia Evaluation  Patient identified by MRN, date of birth, ID band Patient awake    Reviewed: Allergy & Precautions, NPO status , Patient's Chart, lab work & pertinent test results, reviewed documented beta blocker date and time   History of Anesthesia Complications Negative for: history of anesthetic complications  Airway Mallampati: II  TM Distance: >3 FB Neck ROM: Full    Dental  (+) Dental Advisory Given   Pulmonary COPD,  09/24/2020 SARS coronavirus NEG   breath sounds clear to auscultation       Cardiovascular hypertension, Pt. on medications and Pt. on home beta blockers (-) angina+ Peripheral Vascular Disease   Rhythm:Regular Rate:Normal + Systolic murmurs 13/04/6577 cath: DES Cx, 60-70% RCA, severe AS with mean grad 65 mmhg 06/2020 ECHO: EF 60-65% with mild LVH, grade 1 DD, mild MR, AS with mean grad 32 mmHg   Neuro/Psych Anxiety TIACVA, No Residual Symptoms    GI/Hepatic Neg liver ROS, GERD  Medicated and Controlled,  Endo/Other  diabetes (glu 136), Oral Hypoglycemic Agents  Renal/GU negative Renal ROS     Musculoskeletal   Abdominal   Peds  Hematology  (+) Blood dyscrasia (Hb 10.6), anemia , plavix   Anesthesia Other Findings   Reproductive/Obstetrics                           Anesthesia Physical Anesthesia Plan  ASA: III  Anesthesia Plan: MAC   Post-op Pain Management:    Induction:   PONV Risk Score and Plan: 2 and Ondansetron and Treatment may vary due to age or medical condition  Airway Management Planned: Natural Airway and Simple Face Mask  Additional Equipment: Arterial line  Intra-op Plan:   Post-operative Plan:   Informed Consent: I have reviewed the patients History and Physical, chart, labs and discussed the procedure including the risks, benefits and alternatives for the proposed anesthesia with the patient or authorized representative who has  indicated his/her understanding and acceptance.     Dental advisory given  Plan Discussed with: CRNA and Surgeon  Anesthesia Plan Comments:        Anesthesia Quick Evaluation

## 2020-09-28 ENCOUNTER — Inpatient Hospital Stay (HOSPITAL_COMMUNITY): Payer: Medicare Other

## 2020-09-28 ENCOUNTER — Encounter (HOSPITAL_COMMUNITY): Payer: Self-pay | Admitting: Cardiovascular Disease

## 2020-09-28 ENCOUNTER — Encounter (HOSPITAL_COMMUNITY)
Admission: RE | Disposition: A | Discharge status: Home / Self Care | Payer: Medicare Other | Source: Home / Self Care | Attending: Cardiovascular Disease

## 2020-09-28 ENCOUNTER — Other Ambulatory Visit: Payer: Self-pay | Admitting: Physician Assistant

## 2020-09-28 ENCOUNTER — Inpatient Hospital Stay (HOSPITAL_COMMUNITY): Payer: Medicare Other | Admitting: Anesthesiology

## 2020-09-28 ENCOUNTER — Other Ambulatory Visit: Payer: Self-pay

## 2020-09-28 ENCOUNTER — Inpatient Hospital Stay (HOSPITAL_COMMUNITY): Payer: Medicare Other | Admitting: Certified Registered Nurse Anesthetist

## 2020-09-28 ENCOUNTER — Inpatient Hospital Stay (HOSPITAL_COMMUNITY)
Admission: RE | Admit: 2020-09-28 | Discharge: 2020-09-30 | DRG: 267 | Disposition: A | Discharge status: Home / Self Care | Payer: Medicare Other | Attending: Cardiovascular Disease | Admitting: Cardiovascular Disease

## 2020-09-28 ENCOUNTER — Encounter (HOSPITAL_COMMUNITY)
Admission: RE | Disposition: A | Discharge status: Home / Self Care | Payer: Self-pay | Source: Home / Self Care | Attending: Cardiovascular Disease

## 2020-09-28 ENCOUNTER — Inpatient Hospital Stay (HOSPITAL_COMMUNITY): Payer: Medicare Other | Admitting: Vascular Surgery

## 2020-09-28 DIAGNOSIS — E119 Type 2 diabetes mellitus without complications: Secondary | ICD-10-CM

## 2020-09-28 DIAGNOSIS — M810 Age-related osteoporosis without current pathological fracture: Secondary | ICD-10-CM | POA: Diagnosis present

## 2020-09-28 DIAGNOSIS — E669 Obesity, unspecified: Secondary | ICD-10-CM | POA: Diagnosis present

## 2020-09-28 DIAGNOSIS — Z8673 Personal history of transient ischemic attack (TIA), and cerebral infarction without residual deficits: Secondary | ICD-10-CM | POA: Diagnosis not present

## 2020-09-28 DIAGNOSIS — I97638 Postprocedural hematoma of a circulatory system organ or structure following other circulatory system procedure: Secondary | ICD-10-CM

## 2020-09-28 DIAGNOSIS — I4510 Unspecified right bundle-branch block: Secondary | ICD-10-CM | POA: Diagnosis present

## 2020-09-28 DIAGNOSIS — M79606 Pain in leg, unspecified: Secondary | ICD-10-CM

## 2020-09-28 DIAGNOSIS — F419 Anxiety disorder, unspecified: Secondary | ICD-10-CM | POA: Diagnosis present

## 2020-09-28 DIAGNOSIS — I10 Essential (primary) hypertension: Secondary | ICD-10-CM | POA: Diagnosis present

## 2020-09-28 DIAGNOSIS — Z96641 Presence of right artificial hip joint: Secondary | ICD-10-CM | POA: Diagnosis present

## 2020-09-28 DIAGNOSIS — K5730 Diverticulosis of large intestine without perforation or abscess without bleeding: Secondary | ICD-10-CM | POA: Diagnosis present

## 2020-09-28 DIAGNOSIS — E785 Hyperlipidemia, unspecified: Secondary | ICD-10-CM | POA: Diagnosis present

## 2020-09-28 DIAGNOSIS — Z8249 Family history of ischemic heart disease and other diseases of the circulatory system: Secondary | ICD-10-CM

## 2020-09-28 DIAGNOSIS — Y713 Surgical instruments, materials and cardiovascular devices (including sutures) associated with adverse incidents: Secondary | ICD-10-CM | POA: Diagnosis not present

## 2020-09-28 DIAGNOSIS — Z833 Family history of diabetes mellitus: Secondary | ICD-10-CM | POA: Diagnosis not present

## 2020-09-28 DIAGNOSIS — J439 Emphysema, unspecified: Secondary | ICD-10-CM | POA: Diagnosis present

## 2020-09-28 DIAGNOSIS — K219 Gastro-esophageal reflux disease without esophagitis: Secondary | ICD-10-CM | POA: Diagnosis present

## 2020-09-28 DIAGNOSIS — I350 Nonrheumatic aortic (valve) stenosis: Secondary | ICD-10-CM | POA: Diagnosis present

## 2020-09-28 DIAGNOSIS — Z8049 Family history of malignant neoplasm of other genital organs: Secondary | ICD-10-CM | POA: Diagnosis not present

## 2020-09-28 DIAGNOSIS — I440 Atrioventricular block, first degree: Secondary | ICD-10-CM | POA: Diagnosis present

## 2020-09-28 DIAGNOSIS — R209 Unspecified disturbances of skin sensation: Secondary | ICD-10-CM

## 2020-09-28 DIAGNOSIS — Z981 Arthrodesis status: Secondary | ICD-10-CM | POA: Diagnosis not present

## 2020-09-28 DIAGNOSIS — D509 Iron deficiency anemia, unspecified: Secondary | ICD-10-CM | POA: Diagnosis present

## 2020-09-28 DIAGNOSIS — H3530 Unspecified macular degeneration: Secondary | ICD-10-CM | POA: Diagnosis present

## 2020-09-28 DIAGNOSIS — Z6828 Body mass index (BMI) 28.0-28.9, adult: Secondary | ICD-10-CM | POA: Diagnosis not present

## 2020-09-28 DIAGNOSIS — Z952 Presence of prosthetic heart valve: Secondary | ICD-10-CM | POA: Diagnosis not present

## 2020-09-28 DIAGNOSIS — I9789 Other postprocedural complications and disorders of the circulatory system, not elsewhere classified: Secondary | ICD-10-CM | POA: Diagnosis not present

## 2020-09-28 DIAGNOSIS — Y838 Other surgical procedures as the cause of abnormal reaction of the patient, or of later complication, without mention of misadventure at the time of the procedure: Secondary | ICD-10-CM | POA: Diagnosis not present

## 2020-09-28 DIAGNOSIS — Z006 Encounter for examination for normal comparison and control in clinical research program: Secondary | ICD-10-CM

## 2020-09-28 DIAGNOSIS — Z955 Presence of coronary angioplasty implant and graft: Secondary | ICD-10-CM | POA: Diagnosis not present

## 2020-09-28 DIAGNOSIS — I639 Cerebral infarction, unspecified: Secondary | ICD-10-CM | POA: Diagnosis present

## 2020-09-28 HISTORY — DX: Personal history of transient ischemic attack (TIA), and cerebral infarction without residual deficits: Z86.73

## 2020-09-28 HISTORY — DX: Presence of prosthetic heart valve: Z95.2

## 2020-09-28 HISTORY — PX: FEMORAL ARTERY EXPLORATION: SHX5160

## 2020-09-28 HISTORY — PX: TEE WITHOUT CARDIOVERSION: SHX5443

## 2020-09-28 HISTORY — PX: TRANSCATHETER AORTIC VALVE REPLACEMENT, TRANSFEMORAL: SHX6400

## 2020-09-28 LAB — POCT I-STAT, CHEM 8
BUN: 20 mg/dL (ref 8–23)
BUN: 20 mg/dL (ref 8–23)
BUN: 19 mg/dL (ref 8–23)
Calcium, Ion: 1.23 mmol/L (ref 1.15–1.40)
Calcium, Ion: 1.25 mmol/L (ref 1.15–1.40)
Calcium, Ion: 1.21 mmol/L (ref 1.15–1.40)
Chloride: 104 mmol/L (ref 98–111)
Chloride: 102 mmol/L (ref 98–111)
Chloride: 103 mmol/L (ref 98–111)
Creatinine, Ser: 1.1 mg/dL — ABNORMAL HIGH (ref 0.44–1.00)
Creatinine, Ser: 1 mg/dL (ref 0.44–1.00)
Creatinine, Ser: 1 mg/dL (ref 0.44–1.00)
Glucose, Bld: 130 mg/dL — ABNORMAL HIGH (ref 70–99)
Glucose, Bld: 192 mg/dL — ABNORMAL HIGH (ref 70–99)
Glucose, Bld: 185 mg/dL — ABNORMAL HIGH (ref 70–99)
HCT: 30 % — ABNORMAL LOW (ref 36.0–46.0)
HCT: 28 % — ABNORMAL LOW (ref 36.0–46.0)
HCT: 29 % — ABNORMAL LOW (ref 36.0–46.0)
Hemoglobin: 10.2 g/dL — ABNORMAL LOW (ref 12.0–15.0)
Hemoglobin: 9.5 g/dL — ABNORMAL LOW (ref 12.0–15.0)
Hemoglobin: 9.9 g/dL — ABNORMAL LOW (ref 12.0–15.0)
Potassium: 4 mmol/L (ref 3.5–5.1)
Potassium: 4.3 mmol/L (ref 3.5–5.1)
Potassium: 4.5 mmol/L (ref 3.5–5.1)
Sodium: 140 mmol/L (ref 135–145)
Sodium: 140 mmol/L (ref 135–145)
Sodium: 139 mmol/L (ref 135–145)
TCO2: 25 mmol/L (ref 22–32)
TCO2: 26 mmol/L (ref 22–32)
TCO2: 24 mmol/L (ref 22–32)

## 2020-09-28 LAB — ECHOCARDIOGRAM LIMITED
AR max vel: 1.74 cm2
AV Area VTI: 1.33 cm2
AV Area mean vel: 1.44 cm2
AV Mean grad: 43 mmHg
AV Peak grad: 23.8 mmHg
Ao pk vel: 2.44 m/s

## 2020-09-28 LAB — GLUCOSE, CAPILLARY
Glucose-Capillary: 136 mg/dL — ABNORMAL HIGH (ref 70–99)
Glucose-Capillary: 161 mg/dL — ABNORMAL HIGH (ref 70–99)
Glucose-Capillary: 165 mg/dL — ABNORMAL HIGH (ref 70–99)
Glucose-Capillary: 179 mg/dL — ABNORMAL HIGH (ref 70–99)

## 2020-09-28 SURGERY — IMPLANTATION, AORTIC VALVE, TRANSCATHETER, FEMORAL APPROACH
Anesthesia: Monitor Anesthesia Care | Site: Chest

## 2020-09-28 SURGERY — EXPLORATION, ARTERY, FEMORAL
Anesthesia: General | Site: Groin | Laterality: Right

## 2020-09-28 MED ORDER — ONDANSETRON HCL 4 MG/2ML IJ SOLN
INTRAMUSCULAR | Status: AC
  Filled 2020-09-28: qty 2

## 2020-09-28 MED ORDER — PHENYLEPHRINE HCL-NACL 10-0.9 MG/250ML-% IV SOLN
INTRAVENOUS | Status: DC | PRN
  Administered 2020-09-28: 20 ug/min via INTRAVENOUS

## 2020-09-28 MED ORDER — ALBUMIN HUMAN 5 % IV SOLN: INTRAVENOUS | Status: DC | PRN

## 2020-09-28 MED ORDER — INSULIN ASPART 100 UNIT/ML ~~LOC~~ SOLN
0.0000 [IU] | Freq: Three times a day (TID) | SUBCUTANEOUS | Status: DC
  Administered 2020-09-28 (×2): 4 [IU] via SUBCUTANEOUS
  Administered 2020-09-29 (×4): 2 [IU] via SUBCUTANEOUS

## 2020-09-28 MED ORDER — CHLORHEXIDINE GLUCONATE 4 % EX LIQD: 60.0000 mL | Freq: Once | CUTANEOUS | Status: DC

## 2020-09-28 MED ORDER — PROPOFOL 500 MG/50ML IV EMUL
INTRAVENOUS | Status: DC | PRN
  Administered 2020-09-28: 10 ug/kg/min via INTRAVENOUS

## 2020-09-28 MED ORDER — SODIUM CHLORIDE 0.9 % IV SOLN
INTRAVENOUS | Status: AC
  Filled 2020-09-28: qty 1.2

## 2020-09-28 MED ORDER — 0.9 % SODIUM CHLORIDE (POUR BTL) OPTIME
TOPICAL | Status: DC | PRN
  Administered 2020-09-28: 2000 mL

## 2020-09-28 MED ORDER — VANCOMYCIN HCL IN DEXTROSE 1-5 GM/200ML-% IV SOLN: 1000.0000 mg | Freq: Once | INTRAVENOUS | Status: DC

## 2020-09-28 MED ORDER — TRAMADOL HCL 50 MG PO TABS: 50.0000 mg | ORAL_TABLET | ORAL | Status: DC | PRN

## 2020-09-28 MED ORDER — HEPARIN BOLUS VIA INFUSION
7000.0000 [IU] | Freq: Once | INTRAVENOUS | Status: AC
  Administered 2020-09-28: 7000 [IU] via INTRAVENOUS
  Filled 2020-09-28: qty 7000

## 2020-09-28 MED ORDER — SODIUM CHLORIDE 0.9% FLUSH
3.0000 mL | Freq: Two times a day (BID) | INTRAVENOUS | Status: DC
  Administered 2020-09-29 – 2020-09-30 (×3): 3 mL via INTRAVENOUS

## 2020-09-28 MED ORDER — FENTANYL CITRATE (PF) 100 MCG/2ML IJ SOLN
INTRAMUSCULAR | Status: DC | PRN
  Administered 2020-09-28 (×2): 50 ug via INTRAVENOUS

## 2020-09-28 MED ORDER — SODIUM CHLORIDE 0.9 % IV SOLN
INTRAVENOUS | Status: DC | PRN
  Administered 2020-09-28: 1000 mL via INTRAMUSCULAR

## 2020-09-28 MED ORDER — ACETAMINOPHEN 650 MG RE SUPP: 650.0000 mg | Freq: Four times a day (QID) | RECTAL | Status: DC | PRN

## 2020-09-28 MED ORDER — PROPOFOL 10 MG/ML IV BOLUS
INTRAVENOUS | Status: AC
  Filled 2020-09-28: qty 20

## 2020-09-28 MED ORDER — SODIUM CHLORIDE 0.9 % IV SOLN: INTRAVENOUS | Status: DC

## 2020-09-28 MED ORDER — ONDANSETRON HCL 4 MG/2ML IJ SOLN
4.0000 mg | Freq: Four times a day (QID) | INTRAMUSCULAR | Status: DC | PRN
  Administered 2020-09-28 (×3): 4 mg via INTRAVENOUS
  Filled 2020-09-28: qty 2

## 2020-09-28 MED ORDER — PHENYLEPHRINE HCL-NACL 10-0.9 MG/250ML-% IV SOLN
0.0000 ug/min | INTRAVENOUS | Status: DC
  Filled 2020-09-28: qty 250

## 2020-09-28 MED ORDER — PROTAMINE SULFATE 10 MG/ML IV SOLN
INTRAVENOUS | Status: DC | PRN
  Administered 2020-09-28: 120 mg via INTRAVENOUS

## 2020-09-28 MED ORDER — PANTOPRAZOLE SODIUM 40 MG PO TBEC
40.0000 mg | DELAYED_RELEASE_TABLET | Freq: Every day | ORAL | Status: DC
  Administered 2020-09-28 – 2020-09-30 (×3): 40 mg via ORAL
  Filled 2020-09-28 (×3): qty 1

## 2020-09-28 MED ORDER — SODIUM CHLORIDE 0.9 % IV SOLN
INTRAVENOUS | Status: DC | PRN
  Administered 2020-09-28: 500 mL

## 2020-09-28 MED ORDER — CEFAZOLIN SODIUM-DEXTROSE 2-3 GM-%(50ML) IV SOLR
INTRAVENOUS | Status: DC | PRN
  Administered 2020-09-28: 2 g via INTRAVENOUS

## 2020-09-28 MED ORDER — FENTANYL CITRATE (PF) 250 MCG/5ML IJ SOLN
INTRAMUSCULAR | Status: DC | PRN
  Administered 2020-09-28 (×2): 25 ug via INTRAVENOUS
  Administered 2020-09-28: 50 ug via INTRAVENOUS

## 2020-09-28 MED ORDER — LACTATED RINGERS IV SOLN: INTRAVENOUS | Status: DC | PRN

## 2020-09-28 MED ORDER — LACTATED RINGERS IV SOLN: INTRAVENOUS | Status: DC

## 2020-09-28 MED ORDER — NITROGLYCERIN IN D5W 200-5 MCG/ML-% IV SOLN: 0.0000 ug/min | INTRAVENOUS | Status: DC

## 2020-09-28 MED ORDER — LIDOCAINE HCL 1 % IJ SOLN
INTRAMUSCULAR | Status: DC | PRN
  Administered 2020-09-28: 8 mL

## 2020-09-28 MED ORDER — FENTANYL CITRATE (PF) 250 MCG/5ML IJ SOLN
INTRAMUSCULAR | Status: AC
  Filled 2020-09-28: qty 5

## 2020-09-28 MED ORDER — MORPHINE SULFATE (PF) 2 MG/ML IV SOLN: 1.0000 mg | INTRAVENOUS | Status: DC | PRN

## 2020-09-28 MED ORDER — CHLORHEXIDINE GLUCONATE CLOTH 2 % EX PADS: 6.0000 | MEDICATED_PAD | Freq: Every day | CUTANEOUS | Status: DC

## 2020-09-28 MED ORDER — IODIXANOL 320 MG/ML IV SOLN
INTRAVENOUS | Status: DC | PRN
  Administered 2020-09-28: 30 mL

## 2020-09-28 MED ORDER — NOREPINEPHRINE BITARTRATE 1 MG/ML IV SOLN
INTRAVENOUS | Status: DC | PRN
  Administered 2020-09-28 (×2): 1 mL via INTRAVENOUS

## 2020-09-28 MED ORDER — SODIUM CHLORIDE 0.9% FLUSH: 3.0000 mL | INTRAVENOUS | Status: DC | PRN

## 2020-09-28 MED ORDER — PHENYLEPHRINE 40 MCG/ML (10ML) SYRINGE FOR IV PUSH (FOR BLOOD PRESSURE SUPPORT)
PREFILLED_SYRINGE | INTRAVENOUS | Status: DC | PRN
  Administered 2020-09-28 (×2): 120 ug via INTRAVENOUS
  Administered 2020-09-28: 80 ug via INTRAVENOUS
  Administered 2020-09-28: 120 ug via INTRAVENOUS
  Administered 2020-09-28: 80 ug via INTRAVENOUS
  Administered 2020-09-28: 120 ug via INTRAVENOUS

## 2020-09-28 MED ORDER — ACETAMINOPHEN 325 MG PO TABS
650.0000 mg | ORAL_TABLET | Freq: Four times a day (QID) | ORAL | Status: DC | PRN
  Administered 2020-09-28: 650 mg via ORAL
  Filled 2020-09-28: qty 2

## 2020-09-28 MED ORDER — SODIUM CHLORIDE 0.9 % IV SOLN: 250.0000 mL | INTRAVENOUS | Status: DC | PRN

## 2020-09-28 MED ORDER — SODIUM CHLORIDE 0.9 % IV SOLN
1.5000 g | Freq: Two times a day (BID) | INTRAVENOUS | Status: AC
  Administered 2020-09-28 – 2020-09-30 (×4): 1.5 g via INTRAVENOUS
  Filled 2020-09-28 (×4): qty 1.5

## 2020-09-28 MED ORDER — NOREPINEPHRINE 4 MG/250ML-% IV SOLN
INTRAVENOUS | Status: DC | PRN
  Administered 2020-09-28: 2 ug/min via INTRAVENOUS

## 2020-09-28 MED ORDER — CHLORHEXIDINE GLUCONATE 0.12 % MT SOLN
15.0000 mL | Freq: Once | OROMUCOSAL | Status: AC
  Administered 2020-09-28: 15 mL via OROMUCOSAL
  Filled 2020-09-28: qty 15

## 2020-09-28 MED ORDER — CHLORHEXIDINE GLUCONATE 4 % EX LIQD: 30.0000 mL | CUTANEOUS | Status: DC

## 2020-09-28 MED ORDER — CHLORHEXIDINE GLUCONATE 0.12 % MT SOLN: 15.0000 mL | Freq: Once | OROMUCOSAL | Status: DC

## 2020-09-28 MED ORDER — HEPARIN SODIUM (PORCINE) 1000 UNIT/ML IJ SOLN
INTRAMUSCULAR | Status: AC
  Filled 2020-09-28: qty 1

## 2020-09-28 MED ORDER — PROPOFOL 10 MG/ML IV BOLUS
INTRAVENOUS | Status: DC | PRN
  Administered 2020-09-28: 80 mg via INTRAVENOUS

## 2020-09-28 MED ORDER — PROTAMINE SULFATE 10 MG/ML IV SOLN
INTRAVENOUS | Status: DC | PRN
  Administered 2020-09-28: 50 mg via INTRAVENOUS

## 2020-09-28 MED ORDER — ORAL CARE MOUTH RINSE: 15.0000 mL | Freq: Once | OROMUCOSAL | Status: AC

## 2020-09-28 MED ORDER — DEXAMETHASONE SODIUM PHOSPHATE 10 MG/ML IJ SOLN
INTRAMUSCULAR | Status: DC | PRN
  Administered 2020-09-28: 4 mg via INTRAVENOUS

## 2020-09-28 MED ORDER — CLOPIDOGREL BISULFATE 75 MG PO TABS
75.0000 mg | ORAL_TABLET | Freq: Every day | ORAL | Status: DC
  Administered 2020-09-29 – 2020-09-30 (×2): 75 mg via ORAL
  Filled 2020-09-28 (×2): qty 1

## 2020-09-28 MED ORDER — SUCCINYLCHOLINE CHLORIDE 200 MG/10ML IV SOSY
PREFILLED_SYRINGE | INTRAVENOUS | Status: DC | PRN
  Administered 2020-09-28: 120 mg via INTRAVENOUS

## 2020-09-28 MED ORDER — ATORVASTATIN CALCIUM 40 MG PO TABS
40.0000 mg | ORAL_TABLET | Freq: Every day | ORAL | Status: DC
  Administered 2020-09-28 – 2020-09-29 (×2): 40 mg via ORAL
  Filled 2020-09-28 (×2): qty 1

## 2020-09-28 MED ORDER — MIDAZOLAM HCL 2 MG/2ML IJ SOLN
INTRAMUSCULAR | Status: AC
  Filled 2020-09-28: qty 2

## 2020-09-28 MED ORDER — SODIUM CHLORIDE 0.9 % IV SOLN
INTRAVENOUS | Status: AC
  Filled 2020-09-28 (×2): qty 1.2

## 2020-09-28 MED ORDER — ALPRAZOLAM 0.5 MG PO TABS
0.5000 mg | ORAL_TABLET | Freq: Every evening | ORAL | Status: DC | PRN
  Administered 2020-09-28: 0.5 mg via ORAL
  Filled 2020-09-28: qty 1

## 2020-09-28 MED ORDER — PROTAMINE SULFATE 10 MG/ML IV SOLN
INTRAVENOUS | Status: AC
  Filled 2020-09-28: qty 10

## 2020-09-28 MED ORDER — HEPARIN SODIUM (PORCINE) 1000 UNIT/ML IJ SOLN
INTRAMUSCULAR | Status: DC | PRN
  Administered 2020-09-28: 12000 [IU] via INTRAVENOUS

## 2020-09-28 MED ORDER — LIDOCAINE HCL 1 % IJ SOLN
INTRAMUSCULAR | Status: AC
  Filled 2020-09-28: qty 20

## 2020-09-28 MED ORDER — OXYCODONE HCL 5 MG PO TABS: 5.0000 mg | ORAL_TABLET | ORAL | Status: DC | PRN

## 2020-09-28 SURGICAL SUPPLY — 90 items
ADH SKN CLS APL DERMABOND .7 (GAUZE/BANDAGES/DRESSINGS) ×2
APL PRP STRL LF DISP 70% ISPRP (MISCELLANEOUS) ×2
BAG DECANTER FOR FLEXI CONT (MISCELLANEOUS) IMPLANT
BAG SNAP BAND KOVER 36X36 (MISCELLANEOUS) ×3 IMPLANT
BLADE CLIPPER SURG (BLADE) IMPLANT
BLADE STERNUM SYSTEM 6 (BLADE) IMPLANT
BLADE SURG 10 STRL SS (BLADE) IMPLANT
CABLE ADAPT CONN TEMP 6FT (ADAPTER) ×3 IMPLANT
CANISTER SUCT 3000ML PPV (MISCELLANEOUS) IMPLANT
CATH DIAG EXPO 6F AL1 (CATHETERS) ×1 IMPLANT
CATH DIAG EXPO 6F VENT PIG 145 (CATHETERS) ×6 IMPLANT
CATH EXTERNAL FEMALE PUREWICK (CATHETERS) IMPLANT
CATH INFINITI 6F AL2 (CATHETERS) IMPLANT
CATH S G BIP PACING (CATHETERS) ×3 IMPLANT
CHLORAPREP W/TINT 26 (MISCELLANEOUS) ×3 IMPLANT
CLIP VESOCCLUDE MED 24/CT (CLIP) IMPLANT
CLIP VESOCCLUDE SM WIDE 24/CT (CLIP) IMPLANT
CLOSURE MYNX CONTROL 6F/7F (Vascular Products) ×1 IMPLANT
CNTNR URN SCR LID CUP LEK RST (MISCELLANEOUS) ×4 IMPLANT
CONT SPEC 4OZ STRL OR WHT (MISCELLANEOUS)
COVER BACK TABLE 80X110 HD (DRAPES) ×1 IMPLANT
COVER WAND RF STERILE (DRAPES) ×2 IMPLANT
DECANTER SPIKE VIAL GLASS SM (MISCELLANEOUS) ×3 IMPLANT
DERMABOND ADVANCED (GAUZE/BANDAGES/DRESSINGS) ×1
DERMABOND ADVANCED .7 DNX12 (GAUZE/BANDAGES/DRESSINGS) ×2 IMPLANT
DEVICE CLOSURE PERCLS PRGLD 6F (VASCULAR PRODUCTS) ×4 IMPLANT
DRAPE INCISE IOBAN 66X45 STRL (DRAPES) IMPLANT
DRSG TEGADERM 4X4.75 (GAUZE/BANDAGES/DRESSINGS) ×5 IMPLANT
DRYSEAL FLEXSHEATH 18FR 33CM (SHEATH)
ELECT CAUTERY BLADE 6.4 (BLADE) IMPLANT
ELECT REM PT RETURN 9FT ADLT (ELECTROSURGICAL) ×6
ELECTRODE REM PT RTRN 9FT ADLT (ELECTROSURGICAL) ×4 IMPLANT
FELT TEFLON 6X6 (MISCELLANEOUS) ×2 IMPLANT
GAUZE SPONGE 4X4 12PLY STRL (GAUZE/BANDAGES/DRESSINGS) ×3 IMPLANT
GLOVE BIO SURGEON STRL SZ7.5 (GLOVE) IMPLANT
GLOVE BIO SURGEON STRL SZ8 (GLOVE) ×1 IMPLANT
GLOVE EUDERMIC 7 POWDERFREE (GLOVE) ×1 IMPLANT
GLOVE ORTHO TXT STRL SZ7.5 (GLOVE) IMPLANT
GOWN STRL REUS W/ TWL LRG LVL3 (GOWN DISPOSABLE) IMPLANT
GOWN STRL REUS W/ TWL XL LVL3 (GOWN DISPOSABLE) ×2 IMPLANT
GOWN STRL REUS W/TWL LRG LVL3 (GOWN DISPOSABLE) ×6
GOWN STRL REUS W/TWL XL LVL3 (GOWN DISPOSABLE) ×12
GUIDEWIRE CNFDA BRKR CVD (WIRE) ×1 IMPLANT
GUIDEWIRE SAF TJ AMPL .035X180 (WIRE) ×3 IMPLANT
GUIDEWIRE SAFE TJ AMPLATZ EXST (WIRE) ×2 IMPLANT
INSERT FOGARTY SM (MISCELLANEOUS) IMPLANT
KIT BASIN OR (CUSTOM PROCEDURE TRAY) ×3 IMPLANT
KIT DILATOR VASC 18G NDL (KITS) ×1 IMPLANT
KIT HEART LEFT (KITS) ×3 IMPLANT
KIT SUCTION CATH 14FR (SUCTIONS) IMPLANT
KIT TURNOVER KIT B (KITS) ×3 IMPLANT
LOOP VESSEL MAXI BLUE (MISCELLANEOUS) IMPLANT
LOOP VESSEL MINI RED (MISCELLANEOUS) IMPLANT
NS IRRIG 1000ML POUR BTL (IV SOLUTION) ×3 IMPLANT
PACK ENDO MINOR (CUSTOM PROCEDURE TRAY) ×3 IMPLANT
PAD ARMBOARD 7.5X6 YLW CONV (MISCELLANEOUS) ×6 IMPLANT
PAD ELECT DEFIB RADIOL ZOLL (MISCELLANEOUS) ×3 IMPLANT
PENCIL BUTTON HOLSTER BLD 10FT (ELECTRODE) IMPLANT
PERCLOSE PROGLIDE 6F (VASCULAR PRODUCTS) ×6
POSITIONER HEAD DONUT 9IN (MISCELLANEOUS) ×3 IMPLANT
SET MICROPUNCTURE 5F STIFF (MISCELLANEOUS) ×3 IMPLANT
SHEATH BRITE TIP 7FR 35CM (SHEATH) ×3 IMPLANT
SHEATH DRYSEAL FLEX 18FR 33CM (SHEATH) IMPLANT
SHEATH PINNACLE 6F 10CM (SHEATH) ×3 IMPLANT
SHEATH PINNACLE 8F 10CM (SHEATH) ×3 IMPLANT
SLEEVE REPOSITIONING LENGTH 30 (MISCELLANEOUS) ×3 IMPLANT
STOPCOCK MORSE 400PSI 3WAY (MISCELLANEOUS) ×6 IMPLANT
SUT ETHIBOND X763 2 0 SH 1 (SUTURE) IMPLANT
SUT GORETEX CV 4 TH 22 36 (SUTURE) IMPLANT
SUT GORETEX CV4 TH-18 (SUTURE) IMPLANT
SUT MNCRL AB 3-0 PS2 18 (SUTURE) IMPLANT
SUT PROLENE 5 0 C 1 36 (SUTURE) IMPLANT
SUT PROLENE 6 0 C 1 30 (SUTURE) IMPLANT
SUT SILK  1 MH (SUTURE) ×3
SUT SILK 1 MH (SUTURE) ×2 IMPLANT
SUT VIC AB 2-0 CT1 27 (SUTURE)
SUT VIC AB 2-0 CT1 TAPERPNT 27 (SUTURE) IMPLANT
SUT VIC AB 2-0 CTX 36 (SUTURE) IMPLANT
SUT VIC AB 3-0 SH 8-18 (SUTURE) IMPLANT
SYR 30ML LL (SYRINGE) ×1 IMPLANT
SYR 50ML LL SCALE MARK (SYRINGE) ×3 IMPLANT
SYR BULB IRRIG 60ML STRL (SYRINGE) IMPLANT
SYR MEDRAD MARK V 150ML (SYRINGE) ×3 IMPLANT
TOWEL GREEN STERILE (TOWEL DISPOSABLE) ×6 IMPLANT
TRANSDUCER W/STOPCOCK (MISCELLANEOUS) ×6 IMPLANT
TRAY FOLEY SLVR 14FR TEMP STAT (SET/KITS/TRAYS/PACK) IMPLANT
TUBE SUCT INTRACARD DLP 20F (MISCELLANEOUS) IMPLANT
VALVE AORTIC EVOLT PROPLUS 23 (Valve) ×1 IMPLANT
WIRE EMERALD 3MM-J .035X150CM (WIRE) ×3 IMPLANT
WIRE EMERALD 3MM-J .035X260CM (WIRE) ×3 IMPLANT

## 2020-09-28 SURGICAL SUPPLY — 48 items
ADH SKN CLS APL DERMABOND .7 (GAUZE/BANDAGES/DRESSINGS) ×1
BANDAGE ESMARK 6X9 LF (GAUZE/BANDAGES/DRESSINGS) IMPLANT
BNDG CMPR 9X6 STRL LF SNTH (GAUZE/BANDAGES/DRESSINGS)
BNDG ESMARK 6X9 LF (GAUZE/BANDAGES/DRESSINGS)
CANISTER SUCT 3000ML PPV (MISCELLANEOUS) ×2 IMPLANT
CANNULA VESSEL 3MM 2 BLNT TIP (CANNULA) ×2 IMPLANT
CLIP LIGATING EXTRA MED SLVR (CLIP) ×2 IMPLANT
CLIP LIGATING EXTRA SM BLUE (MISCELLANEOUS) ×2 IMPLANT
COVER WAND RF STERILE (DRAPES) ×1 IMPLANT
CUFF TOURN SGL QUICK 34 (TOURNIQUET CUFF)
CUFF TOURN SGL QUICK 42 (TOURNIQUET CUFF) IMPLANT
CUFF TRNQT CYL 34X4.125X (TOURNIQUET CUFF) IMPLANT
DERMABOND ADVANCED (GAUZE/BANDAGES/DRESSINGS) ×1
DERMABOND ADVANCED .7 DNX12 (GAUZE/BANDAGES/DRESSINGS) ×1 IMPLANT
DRAIN SNY 10X20 3/4 PERF (WOUND CARE) IMPLANT
DRAPE X-RAY CASS 24X20 (DRAPES) IMPLANT
ELECT REM PT RETURN 9FT ADLT (ELECTROSURGICAL) ×2
ELECTRODE REM PT RTRN 9FT ADLT (ELECTROSURGICAL) ×1 IMPLANT
EVACUATOR SILICONE 100CC (DRAIN) IMPLANT
GAUZE SPONGE 4X4 12PLY STRL (GAUZE/BANDAGES/DRESSINGS) ×2 IMPLANT
GLOVE SS BIOGEL STRL SZ 7.5 (GLOVE) ×1 IMPLANT
GLOVE SUPERSENSE BIOGEL SZ 7.5 (GLOVE) ×1
GOWN STRL REUS W/ TWL LRG LVL3 (GOWN DISPOSABLE) ×3 IMPLANT
GOWN STRL REUS W/TWL LRG LVL3 (GOWN DISPOSABLE) ×6
INSERT FOGARTY SM (MISCELLANEOUS) IMPLANT
KIT BASIN OR (CUSTOM PROCEDURE TRAY) ×2 IMPLANT
KIT TURNOVER KIT B (KITS) ×2 IMPLANT
NS IRRIG 1000ML POUR BTL (IV SOLUTION) ×4 IMPLANT
PACK PERIPHERAL VASCULAR (CUSTOM PROCEDURE TRAY) ×2 IMPLANT
PAD ARMBOARD 7.5X6 YLW CONV (MISCELLANEOUS) ×4 IMPLANT
PADDING CAST COTTON 6X4 STRL (CAST SUPPLIES) IMPLANT
SET COLLECT BLD 21X3/4 12 (NEEDLE) IMPLANT
STAPLER VISISTAT 35W (STAPLE) IMPLANT
STOPCOCK 4 WAY LG BORE MALE ST (IV SETS) ×1 IMPLANT
SUT ETHILON 3 0 PS 1 (SUTURE) IMPLANT
SUT PROLENE 5 0 C 1 24 (SUTURE) ×2 IMPLANT
SUT PROLENE 6 0 CC (SUTURE) ×5 IMPLANT
SUT SILK 2 0 SH (SUTURE) ×1 IMPLANT
SUT VIC AB 2-0 CTX 36 (SUTURE) ×3 IMPLANT
SUT VIC AB 3-0 SH 27 (SUTURE) ×2
SUT VIC AB 3-0 SH 27X BRD (SUTURE) ×2 IMPLANT
SUT VIC AB 4-0 PS2 18 (SUTURE) ×1 IMPLANT
SYR BULB IRRIG 60ML STRL (SYRINGE) ×1 IMPLANT
TOWEL GREEN STERILE (TOWEL DISPOSABLE) ×2 IMPLANT
TRAY FOLEY MTR SLVR 16FR STAT (SET/KITS/TRAYS/PACK) ×2 IMPLANT
TUBING EXTENTION W/L.L. (IV SETS) IMPLANT
UNDERPAD 30X36 HEAVY ABSORB (UNDERPADS AND DIAPERS) ×2 IMPLANT
WATER STERILE IRR 1000ML POUR (IV SOLUTION) ×2 IMPLANT

## 2020-09-28 NOTE — Progress Notes (Signed)
Patient has arrived from OR post TAVR  omplaining of the R ankle pain. R DP and PT were not palpable, and doppler was used. There were no pulses.  Dr Doren Custard and Dr Early were notified, and both came to examine the patient. STAT ultrasound was also done .Dr Donnetta Hutching decidecdto take the patient back to OR for urgent exploration of the R groin. The patient was given 7000 units of bolus Hepari,,VO per Dr Donnetta Hutching, and 4  mg of Zofran for nausea.

## 2020-09-28 NOTE — Op Note (Signed)
[facility]   MULTIDISCIPLINARY HEART VALVE TEAM   TAVR OPERATIVE NOTE  Denise KUEHNEL [id number]  Date of Procedure:                 09/28/2020  Preoperative Diagnosis:      Severe Aortic Stenosis   Postoperative Diagnosis:    Same   Procedure:        Transcatheter Aortic Valve Replacement - Percutaneous Right Transfemoral Approach             Medtronic Evolut-PRO+  (size 23 mm, model # EVPROPLUS-23US, serial # [id number])              Co-Surgeons:            Gaye Pollack, MD and Sherren Mocha, MD   Anesthesiologist:                  Jenita Seashore, MD  Echocardiographer:              Liane Comber, MD  Pre-operative Echo Findings: ? Severe aortic stenosis  ?  Normal left ventricular systolic function  Post-operative Echo Findings: ? No paravalvular leak ? Normal left ventricular systolic function ? Normal mean prosthetic transvalvular gradient of 16 mm Hg.   BRIEF CLINICAL NOTE AND INDICATIONS FOR SURGERY  This 87 year old woman presents with stage D, severe, symptomatic aortic stenosis with New York Heart Association class III symptoms of exertional fatigue and shortness of breath consistent with chronic diastolic congestive heart failure. I have personally reviewed her 2D echocardiogram, cardiac catheterization, and CTA studies.Her echocardiogram shows a trileaflet aortic valve with severe calcification and restricted mobility. The mean gradient was measured at 32 mmHg with a peak gradient of 51 mmHg. Aortic valve area by VTI was 0.6 cm. Left ventricular ejection fraction was 60 to 65%. Cardiac catheterization showed a high-grade left circumflex stenosis that was successfully treated with a drug-eluting stent on 09/08/2020. She has been maintained on Plavix since then. Cardiac catheterization showed a mean gradient across aortic valve of 57 mmHg consistent with severe aortic stenosis. She said that she has much improved after  stenting of her left circumflex coronary artery. She is not a candidate for open surgical AVR but I think would be a reasonable candidate for transcatheter aortic valve replacement given her activity level and independence at 87 years old. Her gated cardiac CTA shows a relatively small aortic annulus of 279 mm that is suitable for a 23 mm Medtronic Evolut Provalve. Her abdominal and pelvic CTA shows adequate pelvic vascular anatomy to allow transfemoral insertion.  The patientand her daughter werecounseled at length regarding treatment alternatives for management of severe symptomatic aortic stenosis. The risks and benefits of surgical intervention has been discussed in detail. Long-term prognosis with medical therapy was discussed. Alternative approaches such as conventional surgical aortic valve replacement, transcatheter aortic valve replacement, and palliative medical therapy were compared and contrasted at length. This discussion was placed in the context of the patient's own specific clinical presentation and past medical history. All of their questions have been addressed.   Following the decision to proceed with transcatheter aortic valve replacement, a discussion was held regarding what types of management strategies would be attempted intraoperatively in the event of life-threatening complications, including whether or not the patient would be considered a candidate for the use of cardiopulmonary bypass and/or conversion to open sternotomy for attempted surgical intervention.At 87 years old I do not think she is a candidate for emergent  sternotomy to manage any intraoperative complications. The patient is aware of the fact that transient use of cardiopulmonary bypass may be necessary. The patient has been advised of a variety of complications that might develop including but not limited to risks of death, stroke, paravalvular leak, aortic dissection or other major vascular complications,  aortic annulus rupture, device embolization, cardiac rupture or perforation, mitral regurgitation, acute myocardial infarction, arrhythmia, heart block or bradycardia requiring permanent pacemaker placement, congestive heart failure, respiratory failure, renal failure, pneumonia, infection, other late complications related to structural valve deterioration or migration, or other complications that might ultimately cause a temporary or permanent loss of functional independence or other long term morbidity. The patient provides full informed consent for the procedure as described and all questions were answered.    DETAILS OF THE OPERATIVE PROCEDURE  PREPARATION:    The patient is brought to the operating room on the above mentioned date and central monitoring was established by the anesthesia team including placement of a central venous line and radial arterial line. The patient is placed in the supine position on the operating table.  Intravenous antibiotics are administered. General endotracheal anesthesia is induced uneventfully.  A Foley catheter is placed.  Baseline transesophageal echocardiogram was performed. The patient's chest, abdomen, both groins, and both lower extremities are prepared and draped in a sterile manner. A time out procedure is performed.   PERIPHERAL ACCESS:    Using the modified Seldinger technique, femoral arterial and venous access was obtained with placement of 6 Fr sheaths on the left side.  A pigtail diagnostic catheter was passed through the left arterial sheath under fluoroscopic guidance into the aortic root.  A temporary transvenous pacemaker catheter was passed through the left femoral venous sheath under fluoroscopic guidance into the right ventricle.  The pacemaker was tested to ensure stable lead placement and pacemaker capture.    TRANSFEMORAL ACCESS:   Percutaneous transfemoral access and sheath placement was performed by using ultrasound  guidance.  The right common femoral artery was cannulated using a micropuncture needle.  A pair of Abbott Perclose percutaneous closure devices were placed and a 6 French sheath replaced into the femoral artery.  The patient was heparinized systemically and ACT verified > 250 seconds.    An AL-1 catheter was used to direct a straight-tip exchange length wire across the native aortic valve into the left ventricle. This was exchanged out for a pigtail catheter and position was confirmed in the LV apex. Simultaneous LV and Ao pressures were recorded.  The pigtail catheter was exchanged for a Confida wire in the LV apex.  Echocardiography was utilized to confirm appropriate wire position and no sign of entanglement in the mitral subvalvular apparatus.   BALLOON AORTIC VALVULOPLASTY:   Not performed  TRANSCATHETER HEART VALVE DEPLOYMENT:   A Medtronic Evolut-PRO + transcatheter heart valve (size 26 mm) was prepared and loaded into an delivery catheter system per manufacturer's guidelines and the proper orientation of the valve is confirmed under fluoroscopy. The shealth was removed and the femoral artery dilated up to 39F. The delivery system and inline sheath were inserted into the right common femoral artery over the Confida wire and the inline sheath advanced into the abdominal aorta under fluoroscopic guidance. The delivery catheter was advanced around the aortic arch and the valve was carefully positioned across the aortic valve annulus. An aortic root injection was performed to confirm position and the valve deployed using Cusp Overlap technique under fluoroscopic guidance. Intermittent pacing was used during  valve deployment. The delivery system and guidewire were retracted into the descending aorta and the nosecone re-sheathed. Valve function is assessed using echocardiography. There is felt to be no paravalvular leak and no central aortic insufficiency. Post-deployment gradients were acceptable.  The patient's hemodynamic recovery following valve deployment is good.      PROCEDURE COMPLETION:   The delivery system and in-line sheath were removed and femoral artery closure performed using the Perclose devices.   Protamine was administered once femoral arterial repair was complete. The temporary pacemaker, pigtail catheter and femoral sheaths were removed with manual pressure used for hemostasis of the left femoral vein and Mynx closure for hemostasis of the left femoral artery.    The patient tolerated the procedure well and was transported to the cath lab recovery area in stable condition. There were no immediate intraoperative complications. All sponge instrument and needle counts are verified correct at completion of the operation.   No blood products were administered during the operation.  The patient received a total of 30 mL of intravenous contrast during the procedure.   Gaye Pollack, MD

## 2020-09-28 NOTE — Op Note (Signed)
[facility]   MULTIDISCIPLINARY HEART VALVE TEAM   TAVR OPERATIVE NOTE   Date of Procedure:  08/03/2020  Preoperative Diagnosis: Severe Aortic Stenosis   Postoperative Diagnosis: Same   Procedure:    Transcatheter Aortic Valve Replacement - Percutaneous Transfemoral Approach  Medtronic Evolut Pro-Plus (size 23 mm, serial # [id number])   Co-Surgeons:  Gaye Pollack, MD and Sherren Mocha, MD  Anesthesiologist:  Midge Minium, MD  Echocardiographer:  Ena Dawley, MD  Pre-operative Echo Findings:  Severe aortic stenosis  Normal left ventricular systolic function  Post-operative Echo Findings:  No paravalvular leak  Normal/unchanged left ventricular systolic function  BRIEF CLINICAL NOTE AND INDICATIONS FOR SURGERY  87 year old woman with severe, stage D1 symptomatic aortic stenosis associated with New York Heart Association functional class III symptoms of exertional fatigue and shortness of breath.  She has undergone multidisciplinary heart team review of her case and has been deemed a suitable candidate for transfemoral TAVR.  Her preoperative heart catheterization demonstrated a mean transaortic gradient of 57 mmHg diagnostic of severe aortic stenosis.  She was also found to have severe stenosis of the left circumflex and underwent PCI with a drug-eluting stent.  She has had significant clinical improvement since the stenting procedure.  CT angiography studies demonstrated cardiac anatomy suitable for a 23 mm Medtronic evolute pro plus valve.  During the course of the patient's preoperative work up they have been evaluated comprehensively by a multidisciplinary team of specialists coordinated through the [facility] in the [place] [facility].  They have been demonstrated to suffer from symptomatic severe aortic stenosis as noted above. The patient has been counseled extensively as to the relative risks and  benefits of all options for the treatment of severe aortic stenosis including long term medical therapy, conventional surgery for aortic valve replacement, and transcatheter aortic valve replacement.  The patient has been independently evaluated in formal cardiac surgical consultation by Dr Cyndia Bent, who deemed the patient appropriate for TAVR. Based upon review of all of the patient's preoperative diagnostic tests they are felt to be candidate for transcatheter aortic valve replacement using the transfemoral approach as an alternative to conventional surgery.    Following the decision to proceed with transcatheter aortic valve replacement, a discussion has been held regarding what types of management strategies would be attempted intraoperatively in the event of life-threatening complications, including whether or not the patient would be considered a candidate for the use of cardiopulmonary bypass and/or conversion to open sternotomy for attempted surgical intervention.  The patient has been advised of a variety of complications that might develop peculiar to this approach including but not limited to risks of death, stroke, paravalvular leak, aortic dissection or other major vascular complications, aortic annulus rupture, device embolization, cardiac rupture or perforation, acute myocardial infarction, arrhythmia, heart block or bradycardia requiring permanent pacemaker placement, congestive heart failure, respiratory failure, renal failure, pneumonia, infection, other late complications related to structural valve deterioration or migration, or other complications that might ultimately cause a temporary or permanent loss of functional independence or other long term morbidity.  The patient provides full informed consent for the procedure as described and all questions were answered preoperatively.  DETAILS OF THE OPERATIVE PROCEDURE  PREPARATION:   The patient is brought to the operating room on the above  mentioned date and central monitoring was established by the anesthesia team including placement of a radial arterial line. The patient is placed in the supine position on  the operating table.  Intravenous antibiotics are administered. The patient is monitored closely throughout the procedure under conscious sedation.  Baseline transthoracic echocardiogram is performed. The patient's chest, abdomen, both groins, and both lower extremities are prepared and draped in a sterile manner. A time out procedure is performed.   PERIPHERAL ACCESS:   Using ultrasound guidance, femoral arterial and venous access is obtained with placement of 6 Fr sheaths on the left side.  [place] images are captured and digitally stored in the patient's record. A pigtail diagnostic catheter was passed through the femoral arterial sheath under fluoroscopic guidance into the aortic root (non-coronary cusp).  A temporary transvenous pacemaker catheter was passed through the femoral venous sheath under fluoroscopic guidance into the right ventricle.  The pacemaker was tested to ensure stable lead placement and pacemaker capture.   TRANSFEMORAL ACCESS:  A micropuncture technique is used to access the right femoral artery under fluoroscopic and ultrasound guidance.  [place] images are captured and digitally stored in the patient's chart. 2 Perclose devices are deployed at 10' and 2' positions to 'PreClose' the femoral artery. An 8 French sheath is placed and then an Amplatz Superstiff wire is advanced through the sheath. This is changed out for an 18 Fr Dry-Seal sheath after progressively dilating over the Superstiff wire.  An AL-1 catheter was used to direct a straight-tip exchange length wire across the native aortic valve into the left ventricle. This was exchanged out for a pigtail catheter and position was confirmed in the LV apex.  Simultaneous LV and Ao pressures were recorded.  The pigtail catheter was exchanged for a Confida wire in the LV  apex.    BALLOON AORTIC VALVULOPLASTY:  Not performed  TRANSCATHETER HEART VALVE DEPLOYMENT:  A Medtronic Evolut Pro-Plus transcatheter heart valve (size 23 mm) was prepared and crimped per manufacturer's guidelines, and the proper orientation of the valve is confirmed on the Medtronic delivery system. The valve was advanced through the introducer sheath and across the aortic arch over the Confida wire until it is positioned at the base of the pigtail catheter in the aortic valve annulus. Using the fine tuning wheel, valve deployment begins until annular contact is made. Controlled ventricular pacing is performed. Once proper position is confirmed via aortic root angiograms in the cusp overlap and LAO projections, the valve is deployed to 80% where it is fully functional. The patient's hemodynamic recovery following valve deployment is good.  Final position is confirmed and the valve is slowly deployed over 30 seconds until both paddles are released. The delivery catheter and Confida wire are removed and the valve is assessed with echocardiography. Echo demostrated acceptable post-procedural gradients, stable mitral valve function, and no aortic insufficiency.    PROCEDURE COMPLETION:  The sheath was removed and femoral artery closure is performed using the 2 previously deployed Perclose devices.  Protamine is administered once femoral arterial repair was complete. The site is clear with no evidence of bleeding or hematoma after the sutures are tightened. The temporary pacemaker and pigtail catheters are removed. Mynx closure is used for contralateral femoral arterial hemostasis for the 6 Fr sheath.  The patient tolerated the procedure well and is transported to the recovery area in stable condition. There were no immediate intraoperative complications. All sponge instrument and needle counts are verified correct at completion of the operation.   The patient received a total of 30 mL of intravenous  contrast during the procedure.   Sherren Mocha, MD 09/28/2020 3:52 PM

## 2020-09-28 NOTE — Transfer of Care (Signed)
Immediate Anesthesia Transfer of Care Note  Patient: Denise Parrish  Procedure(s) Performed: TRANSCATHETER AORTIC VALVE REPLACEMENT, TRANSFEMORAL (N/A Chest) TRANSESOPHAGEAL ECHOCARDIOGRAM (TEE) (N/A )  Patient Location: PACU  Anesthesia Type:MAC  Level of Consciousness: drowsy and patient cooperative  Airway & Oxygen Therapy: Patient Spontanous Breathing and Patient connected to nasal cannula oxygen  Post-op Assessment: Report given to RN and Post -op Vital signs reviewed and stable  Post vital signs: Reviewed and stable  Last Vitals:  Vitals Value Taken Time  BP 121/51 09/28/20 0947  Temp 36.4 C 09/28/20 0946  Pulse 69 09/28/20 0948  Resp 16 09/28/20 0948  SpO2 100 % 09/28/20 0948  Vitals shown include unvalidated device data.  Last Pain:  Vitals:   09/28/20 0946  TempSrc: Temporal  PainSc: 8       Patients Stated Pain Goal: 0 (03/13/93 8546)  Complications: No complications documented.

## 2020-09-28 NOTE — Consult Note (Signed)
Patient name: Denise Parrish MRN: [id number] DOB: Nov 14, 1922 Sex: female  REASON FOR CONSULT:   Ischemic right lower extremity.  The consult is requested by Dr. Cyndia Bent.   HPI:   Denise Parrish is a pleasant 87 y.o. female who underwent an uneventful TAVR earlier this morning.  She had a 16 French sheath on the right side and 2 Perclose devices were placed on the right with no complications noted and no bleeding noted.  In the holding area she was complaining of pain in the right foot and they were unable to obtain Doppler signals in the right foot.  There was no significant hematoma on the right.  The patient complains of nausea and persistent pain in the right foot with some paresthesias.  Current Facility-Administered Medications  Medication Dose Route Frequency Provider Last Rate Last Admin  . [START ON 09/29/2020] 0.9 %  sodium chloride infusion   Intravenous Continuous Sherren Mocha, MD      . chlorhexidine (HIBICLENS) 4 % liquid 2 application  30 mL Topical UD Sherren Mocha, MD      . chlorhexidine (HIBICLENS) 4 % liquid 4 application  60 mL Topical Once Sherren Mocha, MD       And  . Derrill Memo ON 09/29/2020] chlorhexidine (HIBICLENS) 4 % liquid 4 application  60 mL Topical Once Sherren Mocha, MD      . Derrill Memo ON 09/29/2020] chlorhexidine (PERIDEX) 0.12 % solution 15 mL  15 mL Mouth/Throat Once Sherren Mocha, MD      . heparin 30,000 units/NS 1000 mL solution for CELLSAVER   Other To OR Sherren Mocha, MD      . lactated ringers infusion   Intravenous Continuous Annye Asa, MD   Stopped at 09/28/20 [phone]  . magnesium sulfate (IV Push/IM) injection 40 mEq  40 mEq Other To OR Sherren Mocha, MD      . potassium chloride injection 80 mEq  80 mEq Other To OR Sherren Mocha, MD        REVIEW OF SYSTEMS:  [X]  denotes positive finding, [ ]  denotes negative finding Vascular    Leg swelling    Cardiac    Chest pain or chest pressure:    Shortness of breath upon  exertion:    Short of breath when lying flat:    Irregular heart rhythm:    Constitutional    Fever or chills:     PHYSICAL EXAM:   Vitals:   09/28/20 0851 09/28/20 0856 09/28/20 0901 09/28/20 0946  BP:      Pulse: (!) 109 (!) 112 63 75  Resp:      Temp:    (!) 97.5 F (36.4 C)  TempSrc:    Temporal  SpO2:      Weight:      Height:        GENERAL: The patient is a well-nourished female, in no acute distress. The vital signs are documented above. CARDIOVASCULAR: There is a regular rate and rhythm. PULMONARY: There is good air exchange bilaterally without wheezing or rales. VASCULAR: I cannot palpate a right femoral pulse. I am unable to obtain Doppler signals in the right foot.  The right foot is cool and mottled. She has good Doppler signals in the dorsalis pedis and posterior tibial positions on the left.  DATA:   Hemoglobin is 9.9.  MEDICAL ISSUES:   ISCHEMIC RIGHT LOWER EXTREMITY: This patient has developed acute ischemia of the right lower extremity status post TAVR earlier this morning.  She  had a Perclose device on the right and I suspect this is a focal dissection.  She will require urgent exploration in the operating room for ischemic right lower extremity.  I have discussed the case with Dr. Donnetta Hutching who was able to perform her surgery now.  We have discussed indications for the procedure and the potential complications the patient is agreeable to proceed.  Denise Parrish [facility] [phone]

## 2020-09-28 NOTE — Anesthesia Postprocedure Evaluation (Signed)
Anesthesia Post Note  Patient: Denise Parrish  Procedure(s) Performed: TRANSCATHETER AORTIC VALVE REPLACEMENT, TRANSFEMORAL (N/A Chest) TRANSESOPHAGEAL ECHOCARDIOGRAM (TEE) (N/A )     Patient location during evaluation: Cath Lab Anesthesia Type: MAC Level of consciousness: awake and alert, patient cooperative and oriented Pain management: pain level not controlled Vital Signs Assessment: post-procedure vital signs reviewed and stable Respiratory status: spontaneous breathing, nonlabored ventilation, respiratory function stable and patient connected to nasal cannula oxygen Cardiovascular status: blood pressure returned to baseline and stable Postop Assessment: no apparent nausea or vomiting Anesthetic complications: no Comments: Pt with leg ischemia, will return emergently to OR!   No complications documented.  Last Vitals:  Vitals:   09/28/20 0901 09/28/20 0946  BP:    Pulse: 63 75  Resp:    Temp:  (!) 36.4 C  SpO2:      Last Pain:  Vitals:   09/28/20 0946  TempSrc: Temporal  PainSc: 8                  Taysen Bushart,E. Chantavia Bazzle

## 2020-09-28 NOTE — Anesthesia Postprocedure Evaluation (Signed)
Anesthesia Post Note  Patient: Denise Parrish  Procedure(s) Performed: RIGHT GROIN EXPLORATION WITH REPAIR RIGHT COMMON FEMORAL ARTERY (Right Groin)     Patient location during evaluation: PACU Anesthesia Type: General Level of consciousness: sedated Pain management: pain level controlled Vital Signs Assessment: post-procedure vital signs reviewed and stable Respiratory status: spontaneous breathing and respiratory function stable Cardiovascular status: stable Postop Assessment: no apparent nausea or vomiting Anesthetic complications: no Comments: Pt nauseated prior to induction   No complications documented.  Last Vitals:  Vitals:   09/28/20 1245 09/28/20 1300  BP:    Pulse: (!) 58 (!) 49  Resp: 12 16  Temp:    SpO2: 93% 100%    Last Pain:  Vitals:   09/28/20 1300  TempSrc:   PainSc: 0-No pain                 Chlora Mcbain DANIEL

## 2020-09-28 NOTE — Op Note (Signed)
    OPERATIVE REPORT  DATE OF SURGERY: 09/28/2020  PATIENT: Denise Parrish, 87 y.o. 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  PHYSICIAN ASSISTANT: Rhyne PA C  The assistant was needed for exposure and to expedite the case  ANESTHESIA: General  EBL: per anesthesia record  Total I/O In: 1850 [I.V.:1200; IV Piggyback:650] Out: 110 [Urine:50; Blood:60]  BLOOD ADMINISTERED: none  DRAINS: none  SPECIMEN: none  COUNTS CORRECT:  YES  PATIENT DISPOSITION:  PACU - hemodynamically stable  PROCEDURE DETAILS: Patient underwent TAVR earlier today with a 12 French in the right groin.  Was found in the post recovery unit to have coolness in her right foot versus left and some mottling.  She had no Doppler flow to her right foot.  Vascular surgery was consulted.  Duplex showed very tight narrowing in the common femoral area at the area of the Perclose closure device.  There was patency of the superficial femoral artery distally.  I recommended exploration and repair.  Patient was taken up and placed supine position where the area of the right groin right leg prepped draped you sterile fashion.  Incision was made over the prior puncture site and continued down to the femoral artery.  The Perclose closure devices were noted in the anterior surface of the common femoral artery.  The common femoral artery had a pulse to the level of the closure device but no pulse distal.  A vessel loop was placed in the common femoral artery.  The superficial femoral and profundus arteries were dissected out individually and were controlled with blue Vesseloops.  The patient had been given 7000 units of intravenous heparin in the Cath Lab holding area.  The common superficial femoral and profundus femoris arteries were  occluded.  The Perclose devices were removed.  These were causing a severe stenosis in the artery.  There was some slight dissection in the anterior plaque on the distal portion of the artery.  This did not extend past the puncture site.  The arteriotomy was extended slightly laterally.  The common femoral, superficial femoral and profundus all had excellent backbleeding.  The incision in the artery was closed with interrupted 6-0 Prolene sutures.  Clamps were removed and excellent Doppler flow was noted in the superficial femoral and profundus femoral and the groin.  The patient had biphasic posterior tibial signal at the ankle.  The patient was given 50 mg of protamine to reverse the heparin.  Wounds irrigated with saline.  Hemostasis with cautery.  The wounds were closed with 2-0 Vicryl in several layers in the subcutaneous tissue.  Skin was closed with subcuticular 4-0 Vicryl.  Dressing was applied and the patient was transferred to the recovery room in stable condition   [name], M.D., [facility] 09/28/2020 11:55 AM

## 2020-09-28 NOTE — Anesthesia Preprocedure Evaluation (Signed)
Anesthesia Evaluation  Patient identified by MRN, date of birth, ID band Patient awake    Reviewed: Allergy & Precautions, NPO status , Patient's Chart, lab work & pertinent test results, reviewed documented beta blocker date and time   History of Anesthesia Complications Negative for: history of anesthetic complications  Airway Mallampati: II  TM Distance: >3 FB Neck ROM: Full    Dental  (+) Dental Advisory Given   Pulmonary COPD,  09/24/2020 SARS coronavirus NEG   breath sounds clear to auscultation       Cardiovascular hypertension, Pt. on medications and Pt. on home beta blockers (-) angina+ Peripheral Vascular Disease   Rhythm:Regular Rate:Normal - Systolic murmurs 57/84/6962 cath: DES Cx, 60-70% RCA, severe AS with mean grad 36 mmhg 06/2020 ECHO: EF 60-65% with mild LVH, grade 1 DD, mild MR, AS with mean grad 32 mmHg   Neuro/Psych Anxiety TIACVA, No Residual Symptoms    GI/Hepatic Neg liver ROS, GERD  Medicated and Controlled,Pt nauseated and vomited   Endo/Other  diabetes, Oral Hypoglycemic Agents  Renal/GU negative Renal ROS     Musculoskeletal   Abdominal   Peds  Hematology  (+) Blood dyscrasia (Hb 10.6), anemia , plavix   Anesthesia Other Findings   Reproductive/Obstetrics                             Anesthesia Physical  Anesthesia Plan  ASA: III and emergent  Anesthesia Plan: General   Post-op Pain Management:    Induction: Intravenous, Rapid sequence and Cricoid pressure planned  PONV Risk Score and Plan: 3 and Ondansetron and Treatment may vary due to age or medical condition  Airway Management Planned: Oral ETT  Additional Equipment: Arterial line  Intra-op Plan:   Post-operative Plan: Extubation in OR  Informed Consent: I have reviewed the patients History and Physical, chart, labs and discussed the procedure including the risks, benefits and alternatives for  the proposed anesthesia with the patient or authorized representative who has indicated his/her understanding and acceptance.     Dental advisory given  Plan Discussed with: CRNA, Surgeon and Anesthesiologist  Anesthesia Plan Comments: (Pt develop leg ischemia post TAVR.)        Anesthesia Quick Evaluation

## 2020-09-28 NOTE — Progress Notes (Signed)
Echocardiogram 2D Echocardiogram has been performed.  Oneal Deputy Daejon Lich 09/28/2020, 9:13 AM

## 2020-09-28 NOTE — Anesthesia Procedure Notes (Addendum)
Procedure Name: Intubation Date/Time: 09/28/2020 10:43 AM Performed by: Lowella Dell, CRNA Pre-anesthesia Checklist: Patient identified, Emergency Drugs available, Suction available and Patient being monitored Patient Re-evaluated:Patient Re-evaluated prior to induction Oxygen Delivery Method: Circle System Utilized Preoxygenation: Pre-oxygenation with 100% oxygen Induction Type: IV induction, Rapid sequence and Cricoid Pressure applied Laryngoscope Size: Mac and 3 Grade View: Grade I Tube type: Oral Tube size: 7.0 mm Number of attempts: 1 Airway Equipment and Method: Stylet Placement Confirmation: ETT inserted through vocal cords under direct vision,  positive ETCO2 and breath sounds checked- equal and bilateral Secured at: 21 cm Tube secured with: Tape Dental Injury: Teeth and Oropharynx as per pre-operative assessment  Comments: RSI d/t pt with N/V

## 2020-09-28 NOTE — Progress Notes (Signed)
Pt received from PACU. VSS. Telemetry applied. CHG completed. Bilateral groins level 0. Bilateral DP pulses dopplerable. Pt oriented to room and unit. Call light in reach.  Clyde Canterbury, RN

## 2020-09-28 NOTE — Interval H&P Note (Signed)
History and Physical Interval Note:  09/28/2020 6:55 AM  Denise Parrish  has presented today for surgery, with the diagnosis of Severe Aortic Stenosis.  The various methods of treatment have been discussed with the patient and family. After consideration of risks, benefits and other options for treatment, the patient has consented to  Procedure(s): TRANSCATHETER AORTIC VALVE REPLACEMENT, TRANSFEMORAL (N/A) TRANSESOPHAGEAL ECHOCARDIOGRAM (TEE) (N/A) as a surgical intervention.  The patient's history has been reviewed, patient examined, no change in status, stable for surgery.  I have reviewed the patient's chart and labs.  Questions were answered to the patient's satisfaction.     Gaye Pollack

## 2020-09-28 NOTE — Progress Notes (Signed)
  [facility] TE[facility]  Patient doing well s/p TAVR. She is hemodynamically stable. Groin sites stable. She had an ischemic right leg requiring vascular surgery. Right groin s/p femoral cut down and exploration with repair. ECG with old 1st deg AV block and IRBBB. Had some post op nausea treated with zofran. There is no high grade block. Arterial line discontinued and transferred to 4E. Plan for early ambulation after bedrest completed and hopeful discharge over the next 24-48 hours.   Angelena Form PA-C  [facility]  Pager [phone]

## 2020-09-28 NOTE — Discharge Instructions (Signed)
ACTIVITY AND EXERCISE . Daily activity and exercise are an important part of your recovery. People recover at different rates depending on their general health and type of valve procedure. . Most people recovering from TAVR feel better relatively quickly  . No lifting, pushing, pulling more than 10 pounds (examples to avoid: groceries, vacuuming, gardening, golfing):             - For one week with a procedure through the groin.             - For six weeks for procedures through the chest wall or neck. NOTE: You will typically see one of our providers 7-14 days after your procedure to discuss [place] the above activities.      DRIVING . Do not drive until you are seen for follow up and cleared by a provider. Generally, we ask patient to not drive for 1 week after their procedure. . If you have been told by your doctor in the past that you may not drive, you must talk with him/her before you begin driving again.   DRESSING . Groin site: you may leave the clear dressing over the site for up to one week or until it falls off.   HYGIENE . If you had a femoral (leg) procedure, you may take a shower when you return home. After the shower, pat the site dry. Do NOT use powder, oils or lotions in your groin area until the site has completely healed. . If you had a chest procedure, you may shower when you return home unless specifically instructed not to by your discharging practitioner.             - DO NOT scrub incision; pat dry with a towel.             - DO NOT apply any lotions, oils, powders to the incision.             - No tub baths / swimming for at least 2 weeks. . If you notice any fevers, chills, increased pain, swelling, bleeding or pus, please contact your doctor.   ADDITIONAL INFORMATION . If you are going to have an upcoming dental procedure, please contact our office as you will require antibiotics ahead of time to prevent infection on your heart valve.    If you have any  questions or concerns you can call the structural heart phone during normal business hours 8am-4pm. If you have an urgent need after hours or weekends please call [phone] to talk to the on call provider for general cardiology. If you have an emergency that requires immediate attention, please call 911.    After TAVR Checklist  Check  Test Description   Follow up appointment in 1-2 weeks  You will see our structural heart physician assistant, Nell Range. Your incision sites will be checked and you will be cleared to drive and resume all normal activities if you are doing well.     1 month echo and follow up  You will have an echo to check on your new heart valve and be seen back in the office by Nell Range. Many times the echo is not read by your appointment time, but Joellen Jersey will call you later that day or the following day to report your results.   Follow up with your primary cardiologist You will need to be seen by your primary cardiologist in the following 3-6 months after your 1 month appointment in the valve  clinic. Often times your Plavix or Aspirin will be discontinued during this time, but this is decided on a case by case basis.    1 year echo and follow up You will have another echo to check on your heart valve after 1 year and be seen back in the office by Nell Range. This your last structural heart visit.   Bacterial endocarditis prophylaxis  You will have to take antibiotics for the rest of your life before all dental procedures (even teeth cleanings) to protect your heart valve. Antibiotics are also required before some surgeries. Please check with your cardiologist before scheduling any surgeries. Also, please make sure to tell us if you have a penicillin allergy as you will require an alternative antibiotic.    Groin incision care: Wash the groin wound with soap and water daily and pat dry. (No tub bath-only shower)  Then put a dry gauze or washcloth there to keep this  area dry daily and as needed.  Do not use Vaseline or neosporin on your incisions.  Only use soap and water on your incisions and then protect and keep dry.

## 2020-09-28 NOTE — Progress Notes (Addendum)
A line d/c per order. Pressure held on left radial site. Pt tolerated well. Will continue to monitor.  Arletta Bale, RN

## 2020-09-28 NOTE — Transfer of Care (Signed)
Immediate Anesthesia Transfer of Care Note  Patient: Denise Parrish  Procedure(s) Performed: RIGHT GROIN EXPLORATION WITH REPAIR RIGHT COMMON FEMORAL ARTERY (Right Groin)  Patient Location: PACU  Anesthesia Type:General  Level of Consciousness: awake and patient cooperative  Airway & Oxygen Therapy: Patient Spontanous Breathing and Patient connected to face mask oxygen  Post-op Assessment: Report given to RN, Post -op Vital signs reviewed and stable and Patient moving all extremities X 4  Post vital signs: Reviewed and stable  Last Vitals:  Vitals Value Taken Time  BP 120/49 09/28/20 1202  Temp    Pulse 75 09/28/20 1211  Resp 16 09/28/20 1211  SpO2 99 % 09/28/20 1211  Vitals shown include unvalidated device data.  Last Pain:  Vitals:   09/28/20 0946  TempSrc: Temporal  PainSc: 8       Patients Stated Pain Goal: 0 (82/70/78 6754)  Complications: No complications documented.

## 2020-09-28 NOTE — Anesthesia Procedure Notes (Signed)
Arterial Line Insertion Start/End1/07/2021 7:00 AM Performed by: Janace Litten, CRNA, CRNA  Patient location: Pre-op. Preanesthetic checklist: patient identified, IV checked, risks and benefits discussed, surgical consent, monitors and equipment checked and pre-op evaluation Lidocaine 1% used for infiltration Left, radial was placed Catheter size: 20 G Hand hygiene performed  and maximum sterile barriers used  Allen's test indicative of satisfactory collateral circulation Attempts: 1 Procedure performed without using ultrasound guided technique. Following insertion, dressing applied and Biopatch. Post procedure assessment: normal  Patient tolerated the procedure well with no immediate complications.

## 2020-09-28 NOTE — Anesthesia Procedure Notes (Signed)
Procedure Name: MAC Date/Time: 09/28/2020 7:40 AM Performed by: Janace Litten, CRNA Pre-anesthesia Checklist: Patient identified, Emergency Drugs available, Suction available and Patient being monitored Patient Re-evaluated:Patient Re-evaluated prior to induction Oxygen Delivery Method: Simple face mask

## 2020-09-28 NOTE — Progress Notes (Signed)
Patient ID: Denise Parrish, female   DOB: 1923/01/07, 87 y.o.   MRN: [id number] Comfortable in PACU. Denies any right foot pain. Right groin without hematoma. Good posterior tibial Doppler flow

## 2020-09-28 NOTE — Addendum Note (Signed)
Addendum  created 09/28/20 1214 by Janace Litten, CRNA   Charge Capture section accepted, Visit diagnoses modified

## 2020-09-28 NOTE — Progress Notes (Signed)
  Day of Surgery Note    Subjective:  Sleepy in recovery.  Awakes and answers questions.denies pain in her foot.    Vitals:   09/28/20 1215 09/28/20 1230  BP:    Pulse: 77 72  Resp: 14 13  Temp:    SpO2: 91% 94%    Incision:  Right groin is clean and dry Extremities:  Motor in tact; calf is soft. Denies any pain. -pt with brisk right PT >DP doppler signals and brisk left DP/PT doppler signals.   Lungs:  Non labored    Assessment/Plan:  This is a 87 y.o. female who is s/p  Right femoral artery exploration and primary repair s/p TAVR with post op right leg ischemia  -pt doing well from vascular standpoint in recovery room.   -Motor in tact; calf is soft. Denies any pain. -pt with brisk right PT >DP doppler signals and brisk left DP/PT doppler signals.   -neuro checks post op -dry gauze to right groin to wick moisture to help prevent wound infection   Leontine Locket, PA-C 09/28/2020 12:49 PM [phone]

## 2020-09-29 ENCOUNTER — Encounter (HOSPITAL_COMMUNITY): Payer: Self-pay | Admitting: Vascular Surgery

## 2020-09-29 ENCOUNTER — Inpatient Hospital Stay (HOSPITAL_COMMUNITY): Payer: Medicare Other

## 2020-09-29 DIAGNOSIS — Z952 Presence of prosthetic heart valve: Secondary | ICD-10-CM

## 2020-09-29 LAB — ECHOCARDIOGRAM LIMITED
AR max vel: 1.95 cm2
AV Area VTI: 2.17 cm2
AV Area mean vel: 2.14 cm2
AV Mean grad: 11 mmHg
AV Peak grad: 19.7 mmHg
Ao pk vel: 2.22 m/s
Area-P 1/2: 1.79 cm2
Height: 65 in
MV VTI: 1.67 cm2
Weight: 2239.87 oz

## 2020-09-29 LAB — CBC
HCT: 27.1 % — ABNORMAL LOW (ref 36.0–46.0)
Hemoglobin: 8.6 g/dL — ABNORMAL LOW (ref 12.0–15.0)
MCH: 28.6 pg (ref 26.0–34.0)
MCHC: 31.7 g/dL (ref 30.0–36.0)
MCV: 90 fL (ref 80.0–100.0)
Platelets: 204 10*3/uL (ref 150–400)
RBC: 3.01 MIL/uL — ABNORMAL LOW (ref 3.87–5.11)
RDW: 15.4 % (ref 11.5–15.5)
WBC: 14.8 10*3/uL — ABNORMAL HIGH (ref 4.0–10.5)
nRBC: 0 % (ref 0.0–0.2)

## 2020-09-29 LAB — BASIC METABOLIC PANEL
Anion gap: 10 (ref 5–15)
BUN: 18 mg/dL (ref 8–23)
CO2: 23 mmol/L (ref 22–32)
Calcium: 8.4 mg/dL — ABNORMAL LOW (ref 8.9–10.3)
Chloride: 106 mmol/L (ref 98–111)
Creatinine, Ser: 1.22 mg/dL — ABNORMAL HIGH (ref 0.44–1.00)
GFR, Estimated: 41 mL/min — ABNORMAL LOW (ref >60)
Glucose, Bld: 165 mg/dL — ABNORMAL HIGH (ref 70–99)
Potassium: 3.9 mmol/L (ref 3.5–5.1)
Sodium: 139 mmol/L (ref 135–145)

## 2020-09-29 LAB — GLUCOSE, CAPILLARY
Glucose-Capillary: 140 mg/dL — ABNORMAL HIGH (ref 70–99)
Glucose-Capillary: 137 mg/dL — ABNORMAL HIGH (ref 70–99)
Glucose-Capillary: 124 mg/dL — ABNORMAL HIGH (ref 70–99)
Glucose-Capillary: 150 mg/dL — ABNORMAL HIGH (ref 70–99)

## 2020-09-29 LAB — MAGNESIUM: Magnesium: 1.8 mg/dL (ref 1.7–2.4)

## 2020-09-29 MED ORDER — METOPROLOL TARTRATE 12.5 MG HALF TABLET
12.5000 mg | ORAL_TABLET | Freq: Two times a day (BID) | ORAL | Status: DC
  Administered 2020-09-29 – 2020-09-30 (×2): 12.5 mg via ORAL
  Filled 2020-09-29 (×3): qty 1

## 2020-09-29 NOTE — Progress Notes (Addendum)
[facility]  Subjective  - States she is feeling better   Objective (!) 122/40 80 98.4 F (36.9 C) (Oral) 17 98%  Intake/Output Summary (Last 24 hours) at 09/29/2020 0728 Last data filed at 09/29/2020 0300 Gross per 24 hour  Intake 2499.84 ml  Output 960 ml  Net 1539.84 ml    Right groin soft without hematoma Doppler signals brisk DP/PT/Peroneal foot warm well perfused with intact sensation and motor. Lungs non labored breathing  Assessment/Planning: POD # 1 exploration and primary repair right femoral artery  Brisk inflow to right LE without ischemic changes Stable right LE F/U in 2-3 weeks with DR. Early she lives in [place].  Roxy Horseman 09/29/2020 7:28 AM --  Laboratory Lab Results: Recent Labs    09/28/20 0959 09/29/20 0152  WBC  --  14.8*  HGB 9.9* 8.6*  HCT 29.0* 27.1*  PLT  --  204   BMET Recent Labs    09/28/20 0959 09/29/20 0152  NA 139 139  K 4.5 3.9  CL 103 106  CO2  --  23  GLUCOSE 185* 165*  BUN 19 18  CREATININE 1.00 1.22*  CALCIUM  --  8.4*    COAG Lab Results  Component Value Date   INR 1.0 09/24/2020   INR 1.0 04/26/2020   INR 1.0 04/07/2020   No results found for: PTT  I have interviewed the patient and examined the patient. I agree with the findings by the PA.  The right foot is warm and well-perfused.  Incision looks fine.  Gae Gallop, MD [phone]

## 2020-09-29 NOTE — Progress Notes (Signed)
CARDIAC REHAB PHASE I   PRE:  Rate/Rhythm: 69 SR  BP:  Supine:   Sitting: 124/49  Standing:    SaO2: 95%RA  MODE:  Ambulation: 140 ft   POST:  Rate/Rhythm: 86 SR  BP:  Supine:   Sitting: 167/37  Standing:    SaO2: 96%RA 1000-1040 Checked groin prior to walk.  Painful to get from lying to standing position but once standing, she felt better. Pt walked 140 ft on RA with gait belt use, rolling walker and asst x 2. Pt stated groin not painful to walk and she could put weight on leg. To recliner after walk with call bell and daughter in room.    Graylon Good, RN BSN  09/29/2020 10:37 AM

## 2020-09-29 NOTE — Progress Notes (Addendum)
[place] VALVE TEAM  Patient Name: Denise Parrish Date of Encounter: 09/29/2020  Primary Cardiologist: Dr. Johnsie Cancel / Dr. Burt Knack & Dr. Cyndia Bent (TAVR)  Hospital Problem List     Principal Problem:   S/P TAVR (transcatheter aortic valve replacement) Active Problems:   Diabetes mellitus, type 2 (HCC)   Hypertension   Hyperlipidemia   Obesity   Anemia, iron deficiency   GERD (gastroesophageal reflux disease)   Essential hypertension   Severe aortic stenosis   History of CVA (cerebrovascular accident)     Subjective   Has tenderness at groin site. No other complaints. Feeling okay.   Inpatient Medications    Scheduled Meds: . atorvastatin  40 mg Oral q1800  . Chlorhexidine Gluconate Cloth  6 each Topical Daily  . clopidogrel  75 mg Oral Q breakfast  . insulin aspart  0-24 Units Subcutaneous TID AC & HS  . pantoprazole  40 mg Oral Daily  . sodium chloride flush  3 mL Intravenous Q12H   Continuous Infusions: . sodium chloride    . cefUROXime (ZINACEF)  IV 1.5 g (09/29/20 0945)  . nitroGLYCERIN    . phenylephrine (NEO-SYNEPHRINE) Adult infusion     PRN Meds: sodium chloride, acetaminophen **OR** acetaminophen, ALPRAZolam, morphine injection, ondansetron (ZOFRAN) IV, oxyCODONE, sodium chloride flush, traMADol   Vital Signs    Vitals:   09/29/20 0100 09/29/20 0200 09/29/20 0300 09/29/20 0500  BP: (!) 130/51 (!) 124/53 (!) 122/40   Pulse: 69 84 80   Resp: 17 20 17    Temp:   98.4 F (36.9 C)   TempSrc:   Oral   SpO2: 98% 99% 98%   Weight:    63.5 kg  Height:        Intake/Output Summary (Last 24 hours) at 09/29/2020 1020 Last data filed at 09/29/2020 0300 Gross per 24 hour  Intake 1549.84 ml  Output 930 ml  Net 619.84 ml   Filed Weights   09/28/20 0602 09/29/20 0500  Weight: 77.3 kg 63.5 kg    Physical Exam   GEN: Well nourished, well developed, in no acute distress. obese HEENT: Grossly normal.  Neck:  Supple, no JVD, carotid bruits, or masses. Cardiac: RRR, soft flow murmur. no rubs, or gallops. No clubbing, cyanosis, edema.   Respiratory:  Respirations regular and unlabored, clear to auscultation bilaterally. GI: Soft, nontender, nondistended, BS + x 4. MS: no deformity or atrophy. Skin: warm and dry, no rash.  Groin sites clear without hematoma or ecchymosis. extremely tender to palpation  Neuro:  Strength and sensation are intact. Psych: AAOx3.  Normal affect.  Labs    CBC Recent Labs    09/28/20 0959 09/29/20 0152  WBC  --  14.8*  HGB 9.9* 8.6*  HCT 29.0* 27.1*  MCV  --  90.0  PLT  --  546   Basic Metabolic Panel Recent Labs    09/28/20 0959 09/29/20 0152  NA 139 139  K 4.5 3.9  CL 103 106  CO2  --  23  GLUCOSE 185* 165*  BUN 19 18  CREATININE 1.00 1.22*  CALCIUM  --  8.4*  MG  --  1.8   Liver Function Tests No results for input(s): AST, ALT, ALKPHOS, BILITOT, PROT, ALBUMIN in the last 72 hours. No results for input(s): LIPASE, AMYLASE in the last 72 hours. Cardiac Enzymes No results for input(s): CKTOTAL, CKMB, CKMBINDEX, TROPONINI in the last 72 hours. BNP Invalid input(s): POCBNP D-Dimer No results  for input(s): DDIMER in the last 72 hours. Hemoglobin A1C No results for input(s): HGBA1C in the last 72 hours. Fasting Lipid Panel No results for input(s): CHOL, HDL, LDLCALC, TRIG, CHOLHDL, LDLDIRECT in the last 72 hours. Thyroid Function Tests No results for input(s): TSH, T4TOTAL, T3FREE, THYROIDAB in the last 72 hours.  Invalid input(s): FREET3  Telemetry    Sinus with 1st deg AV block - Personally Reviewed  ECG    Sinus with NS ST/TW abnormalities. - Personally Reviewed  Radiology    VAS [place] LOWER EXTREMITY ARTERIAL DUPLEX  Result Date: 09/28/2020 LOWER EXTREMITY ARTERIAL DUPLEX STUDY Indications: Cold, dusky lower extremity s/p transfemoral TAVR on 09-28-2020.  Current ABI: Not available Comparison Study: No prior studies. Performing  Technologist: Darlin Coco RDMS  Examination Guidelines: A complete evaluation includes B-mode imaging, spectral Doppler, color Doppler, and power Doppler as needed of all accessible portions of each vessel. Bilateral testing is considered an integral part of a complete examination. Limited examinations for reoccurring indications may be performed as noted.  +----------+--------+-----+---------------+-------------------+--------+ RIGHT     PSV cm/sRatioStenosis       Waveform           Comments +----------+--------+-----+---------------+-------------------+--------+ CFA Prox  58      15                                              +----------+--------+-----+---------------+-------------------+--------+ CFA Mid   375     130  75-99% stenosismonophasic                  +----------+--------+-----+---------------+-------------------+--------+ CFA Distal221     91                  monophasic                  +----------+--------+-----+---------------+-------------------+--------+ DFA                                                               +----------+--------+-----+---------------+-------------------+--------+ SFA Prox  96      31                  dampened monophasic         +----------+--------+-----+---------------+-------------------+--------+ SFA Mid   51      17                  dampened monophasic         +----------+--------+-----+---------------+-------------------+--------+ SFA Distal30      14                  dampened monophasic         +----------+--------+-----+---------------+-------------------+--------+ POP Prox  36      16                  dampened monophasic         +----------+--------+-----+---------------+-------------------+--------+ POP Distal27      13                  dampened monophasic         +----------+--------+-----+---------------+-------------------+--------+ ATA Distal23      8  dampened  monophasic         +----------+--------+-----+---------------+-------------------+--------+ PTA Distal             occluded                                   +----------+--------+-----+---------------+-------------------+--------+  Summary: Right: 75-99% stenosis noted in the common femoral artery by velocity ratio as compared to the promixal SFA.  See table(s) above for measurements and observations. Electronically signed by Deitra Mayo MD on 09/28/2020 at 2:20:59 PM.    Final    ECHOCARDIOGRAM LIMITED  Result Date: 09/28/2020    ECHOCARDIOGRAM LIMITED REPORT   Patient Name:   SOLIEL CLAYPOOL Date of Exam: 09/28/2020 Medical Rec #:  [id number]          Height:       65.0 in Accession #:    [id number]         Weight:       170.4 lb Date of Birth:  1923-05-20           BSA:          1.848 m Patient Age:    87 years           BP:           117/66 mmHg Patient Gender: F                  HR:           72 bpm. Exam Location:  Inpatient Procedure: Limited Echo Indications:     Aortic Stenosis i35.0  History:         Patient has prior history of Echocardiogram examinations.                  Aortic Valve Disease.  Sonographer:     Raquel Sarna Senior RDCS Referring Phys:  [place] Diagnosing Phys: Ena Dawley MD IMPRESSIONS  1. 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  2. This was a periprocedural TTE during a TAVR procedure. A 23 mm Medtronic Evolut Pro + valve was successfully deployed in the aortic position with improvement of peak/mean transaortic gradients from 64/43 to 24/16 mmHg, no paravalvular leak and no pericardial effusion was seen post procedure.  3. Left ventricular ejection fraction, by estimation, is 60 to 65%. The left ventricle has normal function. The left ventricle has no regional wall motion abnormalities. There is mild concentric left ventricular hypertrophy.  4. Right ventricular systolic function is normal. The right  ventricular size is normal.  5. Left atrial size was moderately dilated.  6. The mitral valve is rheumatic. Mild mitral valve regurgitation. Moderate mitral stenosis. Moderate mitral annular calcification.  7. The aortic valve is normal in structure. There is severe calcifcation of the aortic valve. There is severe thickening of the aortic valve. Aortic valve regurgitation is mild. Severe aortic valve stenosis.  8. The inferior vena cava is normal in size with greater than 50% respiratory variability, suggesting right atrial pressure of 3 mmHg. FINDINGS  Left Ventricle: Left ventricular ejection fraction, by estimation, is 60 to 65%. The left ventricle has normal function. The left ventricle has no regional wall motion abnormalities. The left ventricular internal cavity size was normal in size. There is  mild concentric left ventricular hypertrophy. Right Ventricle: The right ventricular size is normal. No increase in right ventricular wall thickness. Right  ventricular systolic function is normal. Left Atrium: Left atrial size was moderately dilated. Right Atrium: Right atrial size was normal in size. Pericardium: There is no evidence of pericardial effusion. Mitral Valve: The mitral valve is rheumatic. There is moderate thickening of the mitral valve leaflet(s). There is moderate calcification of the mitral valve leaflet(s). Moderate mitral annular calcification. Mild mitral valve regurgitation. Moderate mitral valve stenosis. MV peak gradient, 15.5 mmHg. The mean mitral valve gradient is 8.0 mmHg with average heart rate of 76 bpm. Tricuspid Valve: The tricuspid valve is not well visualized. Tricuspid valve regurgitation is not demonstrated. No evidence of tricuspid stenosis. Aortic Valve: The aortic valve is normal in structure. There is severe calcifcation of the aortic valve. There is severe thickening of the aortic valve. Aortic valve regurgitation is mild. Severe aortic stenosis is present. Aortic valve mean  gradient measures 43.0 mmHg. Aortic valve peak gradient measures 23.8 mmHg. Aortic valve area, by VTI measures 1.33 cm. Pulmonic Valve: The pulmonic valve was normal in structure. Pulmonic valve regurgitation is not visualized. No evidence of pulmonic stenosis. Aorta: The aortic root is normal in size and structure. Venous: The inferior vena cava is normal in size with greater than 50% respiratory variability, suggesting right atrial pressure of 3 mmHg. IAS/Shunts: No atrial level shunt detected by color flow Doppler. LEFT VENTRICLE PLAX 2D LVOT diam:     1.90 cm LV SV:         77 LV SV Index:   42 LVOT Area:     2.84 cm  AORTIC VALVE AV Area (Vmax):    1.74 cm AV Area (Vmean):   1.44 cm AV Area (VTI):     1.33 cm AV Vmax:           244.00 cm/s AV Vmean:          191.000 cm/s AV VTI:            0.576 m AV Peak Grad:      23.8 mmHg AV Mean Grad:      43.0 mmHg LVOT Vmax:         150.00 cm/s LVOT Vmean:        97.000 cm/s LVOT VTI:          0.271 m LVOT/AV VTI ratio: 0.47 MITRAL VALVE MV Peak grad: 15.5 mmHg SHUNTS MV Mean grad: 8.0 mmHg  Systemic VTI:  0.27 m MV Vmax:      1.97 m/s  Systemic Diam: 1.90 cm Ena Dawley MD Electronically signed by Ena Dawley MD Signature Date/Time: 09/28/2020/9:16:38 AM    Final (Updated)    Structural Heart Procedure  Result Date: 09/28/2020 See surgical note for result.   Cardiac Studies   TAVR OPERATIVE NOTE  LUCYLLE BURKETTE [id number]  Date of Procedure: 09/28/2020  Preoperative Diagnosis:Severe Aortic Stenosis   Postoperative Diagnosis:Same   Procedure:   Transcatheter Aortic Valve Replacement - Percutaneous RightTransfemoral Approach Medtronic Evolut-PRO+ (size 32mm, model # EVPROPLUS-23US, serial # [id number])  Co-Surgeons:Bryan Alveria Apley, MD and Sherren Mocha, MD   Anesthesiologist:C. Glennon Mac, MD  Echocardiographer:K. Meda Coffee,  MD  Pre-operative Echo Findings: ? Severe aortic stenosis  ? Normalleft ventricular systolic function  Post-operative Echo Findings: ? Noparavalvular leak ? Normalleft ventricular systolic function ? Normal mean prosthetic transvalvular gradient of 16 mm Hg.  ________________________   Echo 09/29/20: complete but pending formal read at the time of discharge    Patient Profile     Savannaha P Stumbaugh is a 87 y.o. female with a  history of mild dementia, DMT2, GERD, HLD, HTN, obesity, emphysema, hiatal hernia, history of TIA/CVA, carotid artery disease s/p right CEA 10/2019, complex hospitalization in 04/2020 with GI bleed requiring transfusion, demand ischemia, and mechanical fall in the hospital with a fracture of the left fibula and severe AS who presented to [facility] on 09/28/20 for planned TAVR.  Assessment & Plan    Severe AS: s/p successful TAVR with a 23 mm Medtronic Evolut Pro+ THV via the TF approach on 09/28/20. Post operative echo completed but pending formal read. Groin sites are stable and seen by vascular surgery. ECG with sinus and no high grade heart block. Continue plavix alone given recent stenting and history of GI bleeding. Plan to keep one more day for observation and plan for discharge home tomorrow with daughter.   Acute right leg ischemia: shortly after TAVR, pt developed acute right leg ischemia and required emergent exploration and primary repair right femoral artery. Groin healing well today.   CAD: pre TAVR cath showed severe 90% left circumflex stenosis (large caliber vessel) with successful PCI using a 3.5 x 12 mm resolute Onyx DES.There was moderate to severe distal RCA stenosis of 60 to 70% and mild nonobstructive LAD stenosis. Continue medical therapy. Treated with Plavix alone given recent GI bleed. She has had a big symptomatic improvement since PCI.   Hx of GI bleeding with anemia: Hg stable around 8.6. Continue to monitor.   DMT2: continue SSI     Signed, Angelena Form, PA-C  09/29/2020, 10:20 AM  Pager [phone]  Patient seen, examined. Available data reviewed. Agree with findings, assessment, and plan as outlined by Nell Range, PA-C.  The patient's daughter is at the bedside.  The patient is alert, oriented, no distress.  Lungs are clear, heart is regular rate and rhythm with a 2/6 early peaking systolic ejection murmur at the right upper sternal border, abdomen soft and nontender, bilateral groin sites are clear.  The right femoral incision is clear.  Lower extremities are warm.  Telemetry is reviewed and demonstrates sinus rhythm.  As outlined above, we will observe the patient through tomorrow with likely discharge tomorrow morning.  She seems to be progressing well.  Sherren Mocha, M.D. 09/29/2020 1:14 PM

## 2020-09-29 NOTE — Progress Notes (Signed)
Echocardiogram 2D Echocardiogram has been performed.  Oneal Deputy Jannine Abreu 09/29/2020, 9:30 AM

## 2020-09-30 ENCOUNTER — Other Ambulatory Visit: Payer: Self-pay | Admitting: *Deleted

## 2020-09-30 DIAGNOSIS — I739 Peripheral vascular disease, unspecified: Secondary | ICD-10-CM

## 2020-09-30 DIAGNOSIS — Z952 Presence of prosthetic heart valve: Secondary | ICD-10-CM | POA: Diagnosis not present

## 2020-09-30 LAB — URINALYSIS, ROUTINE W REFLEX MICROSCOPIC
Bacteria, UA: NONE SEEN
Bilirubin Urine: NEGATIVE
Glucose, UA: NEGATIVE mg/dL
Ketones, ur: NEGATIVE mg/dL
Leukocytes,Ua: NEGATIVE
Nitrite: NEGATIVE
Protein, ur: NEGATIVE mg/dL
Specific Gravity, Urine: 1.009 (ref 1.005–1.030)
pH: 5 (ref 5.0–8.0)

## 2020-09-30 LAB — BASIC METABOLIC PANEL
Anion gap: 8 (ref 5–15)
BUN: 16 mg/dL (ref 8–23)
CO2: 23 mmol/L (ref 22–32)
Calcium: 8.2 mg/dL — ABNORMAL LOW (ref 8.9–10.3)
Chloride: 107 mmol/L (ref 98–111)
Creatinine, Ser: 1.13 mg/dL — ABNORMAL HIGH (ref 0.44–1.00)
GFR, Estimated: 45 mL/min — ABNORMAL LOW (ref >60)
Glucose, Bld: 152 mg/dL — ABNORMAL HIGH (ref 70–99)
Potassium: 3.9 mmol/L (ref 3.5–5.1)
Sodium: 138 mmol/L (ref 135–145)

## 2020-09-30 LAB — CBC
HCT: 26.5 % — ABNORMAL LOW (ref 36.0–46.0)
Hemoglobin: 8.5 g/dL — ABNORMAL LOW (ref 12.0–15.0)
MCH: 29 pg (ref 26.0–34.0)
MCHC: 32.1 g/dL (ref 30.0–36.0)
MCV: 90.4 fL (ref 80.0–100.0)
Platelets: 164 10*3/uL (ref 150–400)
RBC: 2.93 MIL/uL — ABNORMAL LOW (ref 3.87–5.11)
RDW: 15.9 % — ABNORMAL HIGH (ref 11.5–15.5)
WBC: 11.8 10*3/uL — ABNORMAL HIGH (ref 4.0–10.5)
nRBC: 0 % (ref 0.0–0.2)

## 2020-09-30 LAB — GLUCOSE, CAPILLARY: Glucose-Capillary: 114 mg/dL — ABNORMAL HIGH (ref 70–99)

## 2020-09-30 MED FILL — Heparin Sodium (Porcine) Inj 1000 Unit/ML: INTRAMUSCULAR | Qty: 30 | Status: AC

## 2020-09-30 MED FILL — Magnesium Sulfate Inj 50%: INTRAMUSCULAR | Qty: 10 | Status: AC

## 2020-09-30 MED FILL — Potassium Chloride Inj 2 mEq/ML: INTRAVENOUS | Qty: 40 | Status: AC

## 2020-09-30 NOTE — Progress Notes (Signed)
Order received to discharge patient.  Telemetry monitor removed and CCMD notified.  PIV access removed without difficulty.  Discharge instructions, follow up, medications and instructions for their use discussed with patient. °

## 2020-09-30 NOTE — Discharge Summary (Addendum)
[place] VALVE TEAM  Discharge Summary    Patient ID: Denise Parrish MRN: [id number]; DOB: March 20, 1923  Admit date: 09/28/2020 Discharge date: 09/30/2020  Primary Care Provider: [name], MD  Primary Cardiologist: Jenkins Rouge, MD / Dr. Burt Knack & Dr. Cyndia Bent (TAVR)  Discharge Diagnoses    Principal Problem:   S/P TAVR (transcatheter aortic valve replacement) Active Problems:   Diabetes mellitus, type 2 (HCC)   Hypertension   Hyperlipidemia   Obesity   Anemia, iron deficiency   GERD (gastroesophageal reflux disease)   Essential hypertension   Severe aortic stenosis   History of CVA (cerebrovascular accident)   Allergies No Known Allergies  Diagnostic Studies/Procedures    TAVR OPERATIVE NOTE  Denise Parrish [id number]  Date of Procedure:09/28/2020  Preoperative Diagnosis:Severe Aortic Stenosis   Postoperative Diagnosis:Same   Procedure:   Transcatheter Aortic Valve Replacement - Percutaneous RightTransfemoral Approach Medtronic Evolut-PRO+(size71m, model # EVPROPLUS-23US, serial ##[id number]  Co-Surgeons:Bryan KAlveria Apley MD aDominga Ferry MD   Anesthesiologist:C. JGlennon Mac MD  EAnne Hahn MD  Pre-operative Echo Findings: ? Severe aortic stenosis  ? Normalleft ventricular systolic function  Post-operative Echo Findings: ? Noparavalvular leak ? Normalleft ventricular systolic function ? Normalmeanprosthetic transvalvular gradient of146mHg.  ________________________   Echo 09/29/20:  IMPRESSIONS  1. The mitral valve is degenerative. Mild mitral valve regurgitation.  Moderate mitral stenosis. Severe mitral annular calcification.  2. Post TAVR with 23 mm supra annular Medtronic Evolut valve No  significant PVL mean gradient 11 peak 19 mmHg lower  than implant DVI 0.69  and AVA 2.1 cm2. The aortic valve has been repaired/replaced.  3. There is severely elevated pulmonary artery systolic pressure.  4. The inferior vena cava is dilated in size with >50% respiratory  variability, suggesting right atrial pressure of 8 mmHg.    History of Present Illness     Denise Parrish a 87.o. female with a history of mild dementia, DMT2, GERD, HLD, HTN, obesity, emphysema, hiatal hernia, history of TIA/CVA, carotid artery disease s/p right CEA 10/2019, complex hospitalization in 04/2020 with GI bleed requiring transfusion, demand ischemia, and mechanical fall in the hospital with a fracture of the left fibula and severe AS who [facility] planned [facility].  She has a history of aortic stenosis followed by Dr. NiJohnsie CancelHer most recent echocardiogram on 06/25/2020 showed a mean gradient of 32 mmHg with a peak gradient of 51 mmHg.  Aortic valve area by VTI was 0.6 cm2.  She had a difficult hospitalization in August 2021 when she was admitted with a GI bleed and demand ischemia and suffered a mechanical fall in the hospital with fracture of her left fibula.  She has made a slow but good recovery from this and feels that she is back at her baseline.  Prior to this [place] and [place] able to walk into the stadium. After her fibula fracture she had to use a wheelchair for a while but is now back walking with a cane 4-prong cane. She now reports progressive exertional shortness of breath and fatigue which has been present for a few years but has worsened over the last 6 months and is now occurring with low-level activity.  During her work-up for aortic stenosis and consideration of TAVR she underwent cardiac catheterization on 09/08/2020 which showed a 90% left circumflex stenosis and a large caliber vessel.  This was successfully treated with  drug-eluting stent.  She said that since that  procedure her breathing has been much better.  Catheterization also showed a 60 to 70% distal RCA stenosis and mild nonobstructive LAD disease.  The mean gradient across aortic valve at catheterization was 57 mmHg with a valve area of 0.78 cm2.  She was continued on dual antiplatelet therapy but only took plavix given recent GI bleed.   The patient has been evaluated by the multidisciplinary valve team and felt to have severe, symptomatic aortic stenosis and to be a suitable candidate for TAVR, which was set up for 09/28/20.   Hospital Course     Consultants: vascular  Severe AS:s/p successful TAVR with a 23 mm Medtronic Evolut Pro+ THV via the TF approach on 09/28/20. Post operative echo showed normally functioning TAVR with a mean gradient of 11 mm hg and no PVL. Groin sites are stable and seen by vascular surgery. ECG with sinus and no high grade heart block. Continue plavix alone given recent stenting and history of GI bleeding. Discharge home today with close outpatient follow up.   Acute right leg ischemia: shortly after TAVR, pt developed acute right leg ischemia and required emergent exploration and primary repair right femoral artery. Groin healing well today. Pain has improved  CAD: pre TAVR cath showed severe 90% left circumflex stenosis (large caliber vessel) with successful PCI using a 3.5 x 12 mm resolute Onyx DES.There was moderate to severe distal RCA stenosis of 60 to 70% and mild nonobstructive LAD stenosis. Continue medical therapy. Treated with Plavix alone given recent GI bleed. She has had a big symptomatic improvement since PCI.   Hx of GI bleeding with anemia: Hg stable around 8.5. Continue to monitor.   DMT2: continue SSI  _____________  Discharge Vitals Blood pressure (!) 133/55, pulse 85, temperature 98.7 F (37.1 C), temperature source Oral, resp. rate 17, height 5' 5" (1.651 m), weight 62.4 kg, SpO2 92 %.  Filed Weights   09/28/20 0602 09/29/20 0500 09/30/20  0334  Weight: 77.3 kg 63.5 kg 62.4 kg    GEN: Well nourished, well developed, in no acute distress HEENT: normal Neck: no JVD or masses Cardiac: RRR; soft flow murmur. No rubs, or gallops,no edema  Respiratory:  clear to auscultation bilaterally, normal work of breathing GI: soft, nontender, nondistended, + BS MS: no deformity or atrophy Skin: warm and dry, no rash.  Groin sites clear without hematoma or ecchymosis  Neuro:  Alert and Oriented x 3, Strength and sensation are intact Psych: euthymic mood, full affect   Labs & Radiologic Studies    CBC Recent Labs    09/29/20 0152 09/30/20 0124  WBC 14.8* 11.8*  HGB 8.6* 8.5*  HCT 27.1* 26.5*  MCV 90.0 90.4  PLT 204 161   Basic Metabolic Panel Recent Labs    09/29/20 0152 09/30/20 0124  NA 139 138  K 3.9 3.9  CL 106 107  CO2 23 23  GLUCOSE 165* 152*  BUN 18 16  CREATININE 1.22* 1.13*  CALCIUM 8.4* 8.2*  MG 1.8  --    Liver Function Tests No results for input(s): AST, ALT, ALKPHOS, BILITOT, PROT, ALBUMIN in the last 72 hours. No results for input(s): LIPASE, AMYLASE in the last 72 hours. Cardiac Enzymes No results for input(s): CKTOTAL, CKMB, CKMBINDEX, TROPONINI in the last 72 hours. BNP Invalid input(s): POCBNP D-Dimer No results for input(s): DDIMER in the last 72 hours. Hemoglobin A1C No results for input(s): HGBA1C in the last 72 hours. Fasting  Lipid Panel No results for input(s): CHOL, HDL, LDLCALC, TRIG, CHOLHDL, LDLDIRECT in the last 72 hours. Thyroid Function Tests No results for input(s): TSH, T4TOTAL, T3FREE, THYROIDAB in the last 72 hours.  Invalid input(s): FREET3 _____________  DG Chest 2 View  Result Date: 09/24/2020 CLINICAL DATA:  87 year old female with aortic stenosis, preoperative TAVR EXAM: CHEST - 2 VIEW COMPARISON:  08/05/2020 FINDINGS: Cardiomediastinal silhouette unchanged in size and contour. No pneumothorax. No pleural effusion. No confluent airspace disease. Coarsened interstitial  markings, similar to the prior. No displaced fracture.  Degenerative changes of the spine. IMPRESSION: Chronic lung changes without evidence of acute cardiopulmonary disease Electronically Signed   By: Corrie Mckusick D.O.   On: 09/24/2020 14:37   CARDIAC CATHETERIZATION  Result Date: 09/08/2020 1.  Severe 90% left circumflex stenosis (large caliber vessel) with successful PCI using a 3.5 x 12 mm resolute Onyx DES 2.  Moderate to severe distal RCA stenosis of 60 to 70%, recommend medical therapy 3.  Mild nonobstructive LAD stenosis 4.  Severe aortic stenosis with mean gradient 57 mmHg and aortic valve area 0.78 cm Recommendations: Dual antiplatelet therapy with aspirin and clopidogrel x6 months.  Continue Cangrelor infusion x2 hours then loaded with clopidogrel 600 mg x 1.  Overnight observation in this elderly patient with severe aortic stenosis post PCI.  Continue outpatient TAVR evaluation.  CT CORONARY MORPH W/CTA COR W/SCORE W/CA W/CM &/OR WO/CM  Addendum Date: 09/21/2020   ADDENDUM REPORT: 09/21/2020 14:21 CLINICAL DATA:  Aortic stenosis EXAM: Cardiac TAVR CT TECHNIQUE: The patient was scanned on a Siemens Force 175 slice scanner. A 120 kV retrospective scan was triggered in the descending thoracic aorta at 111 HU's. Gantry rotation speed was 270 msecs and collimation was .9 mm. No beta blockade or nitro were given. The 3D data set was reconstructed in 5% intervals of the R-R cycle. Systolic and diastolic phases were analyzed on a dedicated work station using MPR, MIP and VRT modes. The patient received 80 cc of contrast. FINDINGS: Aortic Valve: Moderately calcified non coronary cusp with thickened leaflet edges tri leaflet valve Aortic calcium score 1339 Aorta: Mild atherosclerosis arch and great vessels not visualized Sinotubular Junction: 22 mm Ascending Thoracic Aorta: 31 mm Aortic Arch: Not visualized Descending Thoracic Aorta: 22 mm Sinus of Valsalva Measurements: Non-coronary: 26.6 mm Right -  coronary: 24.2 mm Left - coronary: 25.4 mm Coronary Artery Height above Annulus: Left Main: 11.8 mm above annulus Right Coronary: 10.8 mm above annulus Virtual Basal Annulus Measurements: Maximum/Minimum Diameter: 20.9 mm x 16.7 mm Perimeter: 61 mm c Area: 279 mm 2 Coronary Arteries: Sufficient height above annulus for deployment Optimum Fluoroscopic Angle for Delivery: LAO 26 Caudal 1 degree IMPRESSION: 1. Tri-leaflet AV? Rheumatic with small annulus area 279 mm2 best suited for a 23 mm Medtronic Evolut Pro valve 2.  Coronary arteries sufficient height above annulus for deployment 3.  Normal aortic root 3.1 cm 4.  Optimum angiographic angle for deployment LAO 26 Caudal 1 degree 5.  AV calcium score 1339 Jenkins Rouge Electronically Signed   By: Jenkins Rouge M.D.   On: 09/21/2020 14:21   Result Date: 09/21/2020 EXAM: OVER-READ INTERPRETATION  CT CHEST The following report is an over-read performed by radiologist Dr. Vinnie Langton of [facility], [place] on 09/21/2020. This over-read does not include interpretation of cardiac or coronary anatomy or pathology. The coronary calcium score/coronary CTA interpretation by the cardiologist is attached. COMPARISON:  Chest CT 10/05/2019. FINDINGS: Extracardiac findings will be described separately under  dictation for contemporaneously obtained CTA chest, abdomen and pelvis. IMPRESSION: Please see separate dictation for contemporaneously obtained CTA chest, abdomen and pelvis dated 09/21/2020 for full description of relevant extracardiac findings. Electronically Signed: By: Vinnie Langton M.D. On: 09/21/2020 11:22   CT ANGIO CHEST AORTA W/CM & OR WO/CM  Result Date: 09/21/2020 CLINICAL DATA:  87 year old female with history of severe aortic stenosis. Preprocedural study prior to potential transcatheter aortic valve replacement (TAVR) procedure. EXAM: CT ANGIOGRAPHY CHEST, ABDOMEN AND PELVIS TECHNIQUE: Non-contrast CT of the chest was initially obtained. Multidetector  CT imaging through the chest, abdomen and pelvis was performed using the standard protocol during bolus administration of intravenous contrast. Multiplanar reconstructed images and MIPs were obtained and reviewed to evaluate the vascular anatomy. CONTRAST:  153m OMNIPAQUE IOHEXOL 350 MG/ML SOLN COMPARISON:  CT of the abdomen and pelvis 04/28/2020. Chest CT 10/05/2019. FINDINGS: CTA CHEST FINDINGS Cardiovascular: Heart size is normal. There is no significant pericardial fluid, thickening or pericardial calcification. There is aortic atherosclerosis, as well as atherosclerosis of the great vessels of the mediastinum and the coronary arteries, including calcified atherosclerotic plaque in the left main, left anterior descending and left circumflex coronary arteries. Coronary artery stent in the left circumflex coronary artery. Severe thickening calcification of the aortic valve. Moderate calcifications of the mitral annulus. Mediastinum/Lymph Nodes: No pathologically enlarged mediastinal or hilar lymph nodes. Small hiatal hernia. No axillary lymphadenopathy. Lungs/Pleura: 3 mm pulmonary nodule in the left lower lobe (axial image 67 of series 16) stable compared to prior examinations, considered definitively benign. Small calcified granuloma in the right lower lobe. No other larger more suspicious appearing pulmonary nodules or masses are noted. Bilateral apical pleuroparenchymal thickening and nodular architectural distortion, most compatible with areas of chronic post infectious or inflammatory scarring. No acute consolidative airspace disease. No pleural effusions. Musculoskeletal/Soft Tissues: Orthopedic fixation hardware from ACDF in the cervical spine. There are no aggressive appearing lytic or blastic lesions noted in the visualized portions of the skeleton. CTA ABDOMEN AND PELVIS FINDINGS Hepatobiliary: Liver has a shrunken appearance and nodular contour, of cirrhosis. Small calcified granuloma in the periphery  of the right lower lobe. No other suspicious cystic or solid hepatic lesions. No intra or extrahepatic biliary ductal dilatation. Large noncalcified gallstone measuring up to 3.1 cm in diameter lying dependently in the gallbladder. No findings to suggest an acute cholecystitis at this time. Pancreas: No pancreatic mass. No pancreatic ductal dilatation. No pancreatic or peripancreatic fluid collections or inflammatory changes. Spleen: Unremarkable. Adrenals/Urinary Tract: In the posterolateral aspect of the interpolar region of the right kidney there is a 1.2 cm high attenuation lesion (79 HU), which is incompletely characterized on today's examination, but stable over numerous prior studies, presumably a small proteinaceous/hemorrhagic cyst. Other low-attenuation lesions in both kidneys are noted, largest of which are compatible with simple cysts, largest of which is exophytic in the upper pole the left kidney measuring 2.8 x 2.5 cm. Other subcentimeter low-attenuation lesions in both kidneys, too small to characterize, but statistically likely to represent tiny cysts. No hydroureteronephrosis. Urinary bladder is normal in appearance. Bilateral adrenal glands are normal in appearance. Stomach/Bowel: Normal appearance of the stomach. No pathologic dilatation of small bowel or colon. Numerous colonic diverticulae are noted, particularly in the sigmoid colon, without surrounding inflammatory changes to suggest an acute diverticulitis at this time. Normal appendix. Vascular/Lymphatic: Aortic atherosclerosis, without evidence of aneurysm or dissection in the abdominal or pelvic vasculature. Vascular findings and measurements pertinent to potential TAVR procedure, as detailed below.  No lymphadenopathy identified in the abdomen or pelvis. Reproductive: Uterus and ovaries are unremarkable in appearance. Other: No significant volume of ascites.  No pneumoperitoneum. Musculoskeletal: Status post right hip arthroplasty. There  are no aggressive appearing lytic or blastic lesions noted in the visualized portions of the skeleton. VASCULAR MEASUREMENTS PERTINENT TO TAVR: AORTA: Minimal Aortic Diameter-12 x 11 mm Severity of Aortic Calcification-moderate to severe RIGHT PELVIS: Right Common Iliac Artery - Minimal Diameter-6.5 x 5.5 mm Tortuosity-mild Calcification-moderate Right External Iliac Artery - Minimal Diameter-5.6 x 5.7 mm Tortuosity - mild Calcification-none Right Common Femoral Artery - Minimal Diameter-6.1 x 6.2 mm Tortuosity - mild Calcification-mild LEFT PELVIS: Left Common Iliac Artery - Minimal Diameter-6.7 x 6.8 mm Tortuosity - mild Calcification-moderate Left External Iliac Artery - Minimal Diameter-6.5 x 6.4 mm Tortuosity-mild-to-moderate Calcification-none Left Common Femoral Artery - Minimal Diameter-6.1 x 5.1 mm Tortuosity - mild Calcification-mild Review of the MIP images confirms the above findings. IMPRESSION: 1. Vascular findings and measurements pertinent to potential TAVR procedure, as detailed above. 2. Severe thickening and calcification of the aortic valve, compatible with reported clinical history of severe aortic stenosis. 3. Aortic atherosclerosis, in addition to left main and 2 vessel coronary artery disease. Status post PTC I in the left circumflex coronary artery. 4. Morphologic changes in the liver compatible with underlying cirrhosis. 5. Cholelithiasis without evidence of acute cholecystitis at this time. 6. Colonic diverticulosis without evidence of acute diverticulitis. 7. Additional incidental findings, as above. Electronically Signed   By: Vinnie Langton M.D.   On: 09/21/2020 13:13   LONG TERM MONITOR (3-14 DAYS)  Result Date: 09/03/2020 NSR Periods of wenckebach Short period 9 beats atrial tachycardia No sustained arrhythmias   VAS US CAROTID  Result Date: 09/23/2020 Carotid Arterial Duplex Study Indications:       10/21/19 right carotid endarterectomy. Risk Factors:      Diabetes, past  history of smoking. Other Factors:     10/16/19 CTA neck showing 70% stenosis right ICA. Comparison Study:  11/02/11 Bilateral carotid bifurcation and proximal ICA                    plaque,                    resulting in less than 50% diameter stenosis Performing Technologist: June Leap RDMS, RVT  Examination Guidelines: A complete evaluation includes B-mode imaging, spectral Doppler, color Doppler, and power Doppler as needed of all accessible portions of each vessel. Bilateral testing is considered an integral part of a complete examination. Limited examinations for reoccurring indications may be performed as noted.  Right Carotid Findings: +----------+--------+--------+--------+------------------+--------+           PSV cm/sEDV cm/sStenosisPlaque DescriptionComments +----------+--------+--------+--------+------------------+--------+ CCA Prox  102     17                                         +----------+--------+--------+--------+------------------+--------+ CCA Distal85      15              homogeneous                +----------+--------+--------+--------+------------------+--------+ ICA Prox  141     34      1-39%   homogeneous                +----------+--------+--------+--------+------------------+--------+ ICA Mid   162     32                                         +----------+--------+--------+--------+------------------+--------+  ICA Distal136     40                                         +----------+--------+--------+--------+------------------+--------+ ECA       90      8                                          +----------+--------+--------+--------+------------------+--------+ +----------+--------+-------+----------------+-------------------+           PSV cm/sEDV cmsDescribe        Arm Pressure (mmHG) +----------+--------+-------+----------------+-------------------+ [id number]            Multiphasic, WNL                     +----------+--------+-------+----------------+-------------------+ +---------+--------+--+--------+-+---------+ VertebralPSV cm/s64EDV cm/s6Antegrade +---------+--------+--+--------+-+---------+  Left Carotid Findings: +----------+--------+--------+--------+------------------+--------+           PSV cm/sEDV cm/sStenosisPlaque DescriptionComments +----------+--------+--------+--------+------------------+--------+ CCA Prox  107     18                                         +----------+--------+--------+--------+------------------+--------+ CCA Distal84      17                                         +----------+--------+--------+--------+------------------+--------+ ICA Prox  137     31      1-39%   heterogenous               +----------+--------+--------+--------+------------------+--------+ ICA Mid   87      21                                         +----------+--------+--------+--------+------------------+--------+ ICA Distal86      20                                         +----------+--------+--------+--------+------------------+--------+ ECA       149     13                                         +----------+--------+--------+--------+------------------+--------+ +----------+--------+--------+----------------+-------------------+           PSV cm/sEDV cm/sDescribe        Arm Pressure (mmHG) +----------+--------+--------+----------------+-------------------+ [id number]             Multiphasic, WNL                    +----------+--------+--------+----------------+-------------------+ +---------+--------+--+--------+--+---------+ VertebralPSV cm/s82EDV cm/s24Antegrade +---------+--------+--+--------+--+---------+   Summary: Right Carotid: Velocities in the right ICA are consistent with a 1-39% stenosis. Left Carotid: Velocities in the left ICA are consistent with a 1-39% stenosis.  *See table(s) above for measurements and  observations.  Electronically signed by Deitra Mayo MD on 09/23/2020 at 2:09:55 PM.    Final    VAS [place]  LOWER EXTREMITY ARTERIAL DUPLEX  Result Date: 09/28/2020 LOWER EXTREMITY ARTERIAL DUPLEX STUDY Indications: Cold, dusky lower extremity s/p transfemoral TAVR on 09-28-2020.  Current ABI: Not available Comparison Study: No prior studies. Performing Technologist: Darlin Coco RDMS  Examination Guidelines: A complete evaluation includes B-mode imaging, spectral Doppler, color Doppler, and power Doppler as needed of all accessible portions of each vessel. Bilateral testing is considered an integral part of a complete examination. Limited examinations for reoccurring indications may be performed as noted.  +----------+--------+-----+---------------+-------------------+--------+ RIGHT     PSV cm/sRatioStenosis       Waveform           Comments +----------+--------+-----+---------------+-------------------+--------+ CFA Prox  58      15                                              +----------+--------+-----+---------------+-------------------+--------+ CFA Mid   375     130  75-99% stenosismonophasic                  +----------+--------+-----+---------------+-------------------+--------+ CFA Distal221     91                  monophasic                  +----------+--------+-----+---------------+-------------------+--------+ DFA                                                               +----------+--------+-----+---------------+-------------------+--------+ SFA Prox  96      31                  dampened monophasic         +----------+--------+-----+---------------+-------------------+--------+ SFA Mid   51      17                  dampened monophasic         +----------+--------+-----+---------------+-------------------+--------+ SFA Distal30      14                  dampened monophasic          +----------+--------+-----+---------------+-------------------+--------+ POP Prox  36      16                  dampened monophasic         +----------+--------+-----+---------------+-------------------+--------+ POP Distal27      13                  dampened monophasic         +----------+--------+-----+---------------+-------------------+--------+ ATA Distal23      8                   dampened monophasic         +----------+--------+-----+---------------+-------------------+--------+ PTA Distal             occluded                                   +----------+--------+-----+---------------+-------------------+--------+  Summary: Right: 75-99% stenosis noted in the common femoral artery by velocity ratio as compared to the promixal  SFA.  See table(s) above for measurements and observations. Electronically signed by Deitra Mayo MD on 09/28/2020 at 2:20:59 PM.    Final    ECHOCARDIOGRAM LIMITED  Result Date: 09/29/2020    ECHOCARDIOGRAM LIMITED REPORT   Patient Name:   Denise Parrish Date of Exam: 09/29/2020 Medical Rec #:  [id number]          Height:       65.0 in Accession #:    [id number]         Weight:       140.0 lb Date of Birth:  [birth year]           BSA:          1.700 m Patient Age:    87 years           BP:           122/40 mmHg Patient Gender: F                  HR:           63 bpm. Exam Location:  Inpatient Procedure: Limited Echo, Color Doppler and Cardiac Doppler Indications:    Post-TAVR Evaluation z95.2  History:        Patient has prior history of Echocardiogram examinations, most                 recent 09/28/2020. Risk Factors:Hypertension, Diabetes and                 Dyslipidemia.  Sonographer:    Raquel Sarna Senior RDCS Referring Phys: [id number] Eileen Stanford  Sonographer Comments: 09/28/20 41m Medtronic Evolut Pro Plus TAVR IMPRESSIONS  1. The mitral valve is degenerative. Mild mitral valve regurgitation. Moderate mitral stenosis. Severe mitral annular  calcification.  2. Post TAVR with 23 mm supra annular Medtronic Evolut valve No significant PVL mean gradient 11 peak 19 mmHg lower than implant DVI 0.69 and AVA 2.1 cm2. The aortic valve has been repaired/replaced.  3. There is severely elevated pulmonary artery systolic pressure.  4. The inferior vena cava is dilated in size with >50% respiratory variability, suggesting right atrial pressure of 8 mmHg. FINDINGS  Right Ventricle: There is severely elevated pulmonary artery systolic pressure. The tricuspid regurgitant velocity is 3.37 m/s, and with an assumed right atrial pressure of 15 mmHg, the estimated right ventricular systolic pressure is 633.0mmHg. Mitral Valve: The mitral valve is degenerative in appearance. There is moderate thickening of the mitral valve leaflet(s). There is moderate calcification of the mitral valve leaflet(s). Severe mitral annular calcification. Mild mitral valve regurgitation. Moderate mitral valve stenosis. MV peak gradient, 11.0 mmHg. The mean mitral valve gradient is 5.0 mmHg. Tricuspid Valve: Tricuspid valve regurgitation is mild. Aortic Valve: Post TAVR with 23 mm supra annular Medtronic Evolut valve No significant PVL mean gradient 11 peak 19 mmHg lower than implant DVI 0.69 and AVA 2.1 cm2. The aortic valve has been repaired/replaced. Aortic valve mean gradient measures 11.0 mmHg. Aortic valve peak gradient measures 19.7 mmHg. Aortic valve area, by VTI measures 2.17 cm. Venous: The inferior vena cava is dilated in size with greater than 50% respiratory variability, suggesting right atrial pressure of 8 mmHg. LEFT VENTRICLE PLAX 2D LVOT diam:     2.00 cm LV SV:         99 LV SV Index:   58 LVOT Area:     3.14 cm  AORTIC VALVE AV Area (Vmax):    1.95 cm AV  Area (Vmean):   2.14 cm AV Area (VTI):     2.17 cm AV Vmax:           222.00 cm/s AV Vmean:          154.000 cm/s AV VTI:            0.454 m AV Peak Grad:      19.7 mmHg AV Mean Grad:      11.0 mmHg LVOT Vmax:          138.00 cm/s LVOT Vmean:        105.000 cm/s LVOT VTI:          0.314 m LVOT/AV VTI ratio: 0.69 MITRAL VALVE              TRICUSPID VALVE MV Area (PHT): 1.79 cm   TR Peak grad:   45.4 mmHg MV Area VTI:   1.67 cm   TR Vmax:        337.00 cm/s MV Peak grad:  11.0 mmHg MV Mean grad:  5.0 mmHg   SHUNTS MV Vmax:       1.66 m/s   Systemic VTI:  0.31 m MV Vmean:      100.0 cm/s Systemic Diam: 2.00 cm Jenkins Rouge MD Electronically signed by Jenkins Rouge MD Signature Date/Time: 09/29/2020/6:29:09 PM    Final    ECHOCARDIOGRAM LIMITED  Result Date: 09/28/2020    ECHOCARDIOGRAM LIMITED REPORT   Patient Name:   Denise Parrish Date of Exam: 09/28/2020 Medical Rec #:  [id number]          Height:       65.0 in Accession #:    [id number]         Weight:       170.4 lb Date of Birth:  Jan 16, 1923           BSA:          1.848 m Patient Age:    87 years           BP:           117/66 mmHg Patient Gender: F                  HR:           72 bpm. Exam Location:  Inpatient Procedure: Limited Echo Indications:     Aortic Stenosis i35.0  History:         Patient has prior history of Echocardiogram examinations.                  Aortic Valve Disease.  Sonographer:     Raquel Sarna Senior RDCS Referring Phys:  Louisa Diagnosing Phys: Ena Dawley MD IMPRESSIONS  1. 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  2. This was a periprocedural TTE during a TAVR procedure. A 23 mm Medtronic Evolut Pro + valve was successfully deployed in the aortic position with improvement of peak/mean transaortic gradients from 64/43 to 24/16 mmHg, no paravalvular leak and no pericardial effusion was seen post procedure.  3. Left ventricular ejection fraction, by estimation, is 60 to 65%. The left ventricle has normal function. The left ventricle has no regional wall motion abnormalities. There is mild concentric left ventricular hypertrophy.  4. Right ventricular systolic function is normal. The right  ventricular size is normal.  5. Left atrial size was moderately dilated.  6. The mitral valve is rheumatic. Mild mitral valve regurgitation. Moderate  mitral stenosis. Moderate mitral annular calcification.  7. The aortic valve is normal in structure. There is severe calcifcation of the aortic valve. There is severe thickening of the aortic valve. Aortic valve regurgitation is mild. Severe aortic valve stenosis.  8. The inferior vena cava is normal in size with greater than 50% respiratory variability, suggesting right atrial pressure of 3 mmHg. FINDINGS  Left Ventricle: Left ventricular ejection fraction, by estimation, is 60 to 65%. The left ventricle has normal function. The left ventricle has no regional wall motion abnormalities. The left ventricular internal cavity size was normal in size. There is  mild concentric left ventricular hypertrophy. Right Ventricle: The right ventricular size is normal. No increase in right ventricular wall thickness. Right ventricular systolic function is normal. Left Atrium: Left atrial size was moderately dilated. Right Atrium: Right atrial size was normal in size. Pericardium: There is no evidence of pericardial effusion. Mitral Valve: The mitral valve is rheumatic. There is moderate thickening of the mitral valve leaflet(s). There is moderate calcification of the mitral valve leaflet(s). Moderate mitral annular calcification. Mild mitral valve regurgitation. Moderate mitral valve stenosis. MV peak gradient, 15.5 mmHg. The mean mitral valve gradient is 8.0 mmHg with average heart rate of 76 bpm. Tricuspid Valve: The tricuspid valve is not well visualized. Tricuspid valve regurgitation is not demonstrated. No evidence of tricuspid stenosis. Aortic Valve: The aortic valve is normal in structure. There is severe calcifcation of the aortic valve. There is severe thickening of the aortic valve. Aortic valve regurgitation is mild. Severe aortic stenosis is present. Aortic valve mean  gradient measures 43.0 mmHg. Aortic valve peak gradient measures 23.8 mmHg. Aortic valve area, by VTI measures 1.33 cm. Pulmonic Valve: The pulmonic valve was normal in structure. Pulmonic valve regurgitation is not visualized. No evidence of pulmonic stenosis. Aorta: The aortic root is normal in size and structure. Venous: The inferior vena cava is normal in size with greater than 50% respiratory variability, suggesting right atrial pressure of 3 mmHg. IAS/Shunts: No atrial level shunt detected by color flow Doppler. LEFT VENTRICLE PLAX 2D LVOT diam:     1.90 cm LV SV:         77 LV SV Index:   42 LVOT Area:     2.84 cm  AORTIC VALVE AV Area (Vmax):    1.74 cm AV Area (Vmean):   1.44 cm AV Area (VTI):     1.33 cm AV Vmax:           244.00 cm/s AV Vmean:          191.000 cm/s AV VTI:            0.576 m AV Peak Grad:      23.8 mmHg AV Mean Grad:      43.0 mmHg LVOT Vmax:         150.00 cm/s LVOT Vmean:        97.000 cm/s LVOT VTI:          0.271 m LVOT/AV VTI ratio: 0.47 MITRAL VALVE MV Peak grad: 15.5 mmHg SHUNTS MV Mean grad: 8.0 mmHg  Systemic VTI:  0.27 m MV Vmax:      1.97 m/s  Systemic Diam: 1.90 cm Ena Dawley MD Electronically signed by Ena Dawley MD Signature Date/Time: 09/28/2020/9:16:38 AM    Final (Updated)    Structural Heart Procedure  Result Date: 09/28/2020 See surgical note for result.  CT Angio Abd/Pel w/ and/or w/o  Result Date: 09/21/2020 CLINICAL DATA:  87 year old  female with history of severe aortic stenosis. Preprocedural study prior to potential transcatheter aortic valve replacement (TAVR) procedure. EXAM: CT ANGIOGRAPHY CHEST, ABDOMEN AND PELVIS TECHNIQUE: Non-contrast CT of the chest was initially obtained. Multidetector CT imaging through the chest, abdomen and pelvis was performed using the standard protocol during bolus administration of intravenous contrast. Multiplanar reconstructed images and MIPs were obtained and reviewed to evaluate the vascular anatomy.  CONTRAST:  132m OMNIPAQUE IOHEXOL 350 MG/ML SOLN COMPARISON:  CT of the abdomen and pelvis 04/28/2020. Chest CT 10/05/2019. FINDINGS: CTA CHEST FINDINGS Cardiovascular: Heart size is normal. There is no significant pericardial fluid, thickening or pericardial calcification. There is aortic atherosclerosis, as well as atherosclerosis of the great vessels of the mediastinum and the coronary arteries, including calcified atherosclerotic plaque in the left main, left anterior descending and left circumflex coronary arteries. Coronary artery stent in the left circumflex coronary artery. Severe thickening calcification of the aortic valve. Moderate calcifications of the mitral annulus. Mediastinum/Lymph Nodes: No pathologically enlarged mediastinal or hilar lymph nodes. Small hiatal hernia. No axillary lymphadenopathy. Lungs/Pleura: 3 mm pulmonary nodule in the left lower lobe (axial image 67 of series 16) stable compared to prior examinations, considered definitively benign. Small calcified granuloma in the right lower lobe. No other larger more suspicious appearing pulmonary nodules or masses are noted. Bilateral apical pleuroparenchymal thickening and nodular architectural distortion, most compatible with areas of chronic post infectious or inflammatory scarring. No acute consolidative airspace disease. No pleural effusions. Musculoskeletal/Soft Tissues: Orthopedic fixation hardware from ACDF in the cervical spine. There are no aggressive appearing lytic or blastic lesions noted in the visualized portions of the skeleton. CTA ABDOMEN AND PELVIS FINDINGS Hepatobiliary: Liver has a shrunken appearance and nodular contour, of cirrhosis. Small calcified granuloma in the periphery of the right lower lobe. No other suspicious cystic or solid hepatic lesions. No intra or extrahepatic biliary ductal dilatation. Large noncalcified gallstone measuring up to 3.1 cm in diameter lying dependently in the gallbladder. No findings to  suggest an acute cholecystitis at this time. Pancreas: No pancreatic mass. No pancreatic ductal dilatation. No pancreatic or peripancreatic fluid collections or inflammatory changes. Spleen: Unremarkable. Adrenals/Urinary Tract: In the posterolateral aspect of the interpolar region of the right kidney there is a 1.2 cm high attenuation lesion (79 HU), which is incompletely characterized on today's examination, but stable over numerous prior studies, presumably a small proteinaceous/hemorrhagic cyst. Other low-attenuation lesions in both kidneys are noted, largest of which are compatible with simple cysts, largest of which is exophytic in the upper pole the left kidney measuring 2.8 x 2.5 cm. Other subcentimeter low-attenuation lesions in both kidneys, too small to characterize, but statistically likely to represent tiny cysts. No hydroureteronephrosis. Urinary bladder is normal in appearance. Bilateral adrenal glands are normal in appearance. Stomach/Bowel: Normal appearance of the stomach. No pathologic dilatation of small bowel or colon. Numerous colonic diverticulae are noted, particularly in the sigmoid colon, without surrounding inflammatory changes to suggest an acute diverticulitis at this time. Normal appendix. Vascular/Lymphatic: Aortic atherosclerosis, without evidence of aneurysm or dissection in the abdominal or pelvic vasculature. Vascular findings and measurements pertinent to potential TAVR procedure, as detailed below. No lymphadenopathy identified in the abdomen or pelvis. Reproductive: Uterus and ovaries are unremarkable in appearance. Other: No significant volume of ascites.  No pneumoperitoneum. Musculoskeletal: Status post right hip arthroplasty. There are no aggressive appearing lytic or blastic lesions noted in the visualized portions of the skeleton. VASCULAR MEASUREMENTS PERTINENT TO TAVR: AORTA: Minimal Aortic Diameter-12 x 11  mm Severity of Aortic Calcification-moderate to severe RIGHT  PELVIS: Right Common Iliac Artery - Minimal Diameter-6.5 x 5.5 mm Tortuosity-mild Calcification-moderate Right External Iliac Artery - Minimal Diameter-5.6 x 5.7 mm Tortuosity - mild Calcification-none Right Common Femoral Artery - Minimal Diameter-6.1 x 6.2 mm Tortuosity - mild Calcification-mild LEFT PELVIS: Left Common Iliac Artery - Minimal Diameter-6.7 x 6.8 mm Tortuosity - mild Calcification-moderate Left External Iliac Artery - Minimal Diameter-6.5 x 6.4 mm Tortuosity-mild-to-moderate Calcification-none Left Common Femoral Artery - Minimal Diameter-6.1 x 5.1 mm Tortuosity - mild Calcification-mild Review of the MIP images confirms the above findings. IMPRESSION: 1. Vascular findings and measurements pertinent to potential TAVR procedure, as detailed above. 2. Severe thickening and calcification of the aortic valve, compatible with reported clinical history of severe aortic stenosis. 3. Aortic atherosclerosis, in addition to left main and 2 vessel coronary artery disease. Status post PTC I in the left circumflex coronary artery. 4. Morphologic changes in the liver compatible with underlying cirrhosis. 5. Cholelithiasis without evidence of acute cholecystitis at this time. 6. Colonic diverticulosis without evidence of acute diverticulitis. 7. Additional incidental findings, as above. Electronically Signed   By: Vinnie Langton M.D.   On: 09/21/2020 13:13   Disposition   Pt is being discharged home today in good condition.  Follow-up Plans & Appointments     Follow-up Information    Eileen Stanford, PA-C. Go on 10/06/2020.   Specialties: Cardiology, Radiology Why: @ 2:30pm, please arrive at least 10 minutes early.  Contact information: [place] [id number] [phone]        [name], MD Follow up in 2 week(s).   Specialties: Vascular Surgery, Cardiology Contact information: [address] [place] [address] [phone]                 Discharge Medications   Allergies as of 09/30/2020   No Known Allergies     Medication List    TAKE these medications   ALPRAZolam 0.5 MG tablet Commonly known as: XANAX Take 0.5 mg by mouth at bedtime as needed for anxiety.   atorvastatin 40 MG tablet Commonly known as: LIPITOR Take 1 tablet (40 mg total) by mouth daily at 6 PM.   cholecalciferol 25 MCG (1000 UNIT) tablet Commonly known as: VITAMIN D Take 1,000 Units by mouth daily.   clopidogrel 75 MG tablet Commonly known as: PLAVIX Take 1 tablet (75 mg total) by mouth daily with breakfast.   glimepiride 1 MG tablet Commonly known as: AMARYL Take 1 mg by mouth daily with breakfast.   LUTEIN PO Take by mouth daily.   meclizine 12.5 MG tablet Commonly known as: ANTIVERT Take 1 tablet (12.5 mg total) by mouth 3 (three) times daily as needed for dizziness.   metoprolol tartrate 25 MG tablet Commonly known as: LOPRESSOR Take 0.5 tablets (12.5 mg total) by mouth 2 (two) times daily.   pantoprazole 40 MG tablet Commonly known as: PROTONIX Take 1 tablet (40 mg total) by mouth daily.   PRESERVISION AREDS 2 PO Take 1 tablet by mouth See admin instructions. Takes 1 tablet in the morning and 1 tablet at night for eyes.   VISINE DRY EYE OP Apply 1 drop to eye 3 (three) times daily as needed (for dry eye relief).   VITAMIN C PO Take 1 tablet by mouth daily.   vitamin E 180 MG (400 UNITS) capsule Take 400 Units by mouth daily.  Outstanding Labs/Studies   none  Duration of Discharge Encounter   Greater than 30 minutes including physician time.  Signed, Angelena Form, PA-C 09/30/2020, 10:13 AM [phone]  Agree with findings outlined above. Patient discharged before I saw her. Echo reviewed showing normal TAVR valve function. Agree with plavix monotherapy because of GI bleed earlier this year. Follow-up as outlined above.   Sherren Mocha, M.D. 09/30/2020 1:29 PM

## 2020-09-30 NOTE — Progress Notes (Signed)
CARDIAC REHAB PHASE I   PRE:  Rate/Rhythm: 80 SR  BP:  Supine:   Sitting: 130/51  Standing:    SaO2: 94%RA  MODE:  Ambulation: 200 ft   POST:  Rate/Rhythm: 101 ST  BP:  Supine:   Sitting: 141/51  Standing:    SaO2: 96%RA 0901-0930 Pt needs asst x 1 to stand due to groin soreness. Once up, pt able to walk with rolling walker with little assistance. Used gait belt for safety precaution. To BSC prior to walk. Pt walked 200 ft on RA with walker. To recliner after walk with call bell. Pt knows to call if assistance needed. Discussed with pt that she needs someone to walk with her and help stand until stronger and groin less painful.   Graylon Good, RN BSN  09/30/2020 9:25 AM

## 2020-09-30 NOTE — Progress Notes (Addendum)
[facility]  Subjective  - right LE fells better   Objective (!) 133/55 85 98.7 F (37.1 C) (Oral) 17 92%  Intake/Output Summary (Last 24 hours) at 09/30/2020 0746 Last data filed at 09/30/2020 0335 Gross per 24 hour  Intake 480 ml  Output 650 ml  Net -170 ml   Right groin soft without hematoma Doppler signals brisk DP/PT/Peroneal foot warm well perfused with intact sensation and motor. Lungs non labored breathing   Assessment/Planning: POD # 2 exploration and primary repair right femoral artery  Brisk tibial signals on doppler Groin stable F/U with Dr. Donnetta Hutching in [place].  Roxy Horseman 09/30/2020 7:46 AM --  Laboratory Lab Results: Recent Labs    09/29/20 0152 09/30/20 0124  WBC 14.8* 11.8*  HGB 8.6* 8.5*  HCT 27.1* 26.5*  PLT 204 164   BMET Recent Labs    09/29/20 0152 09/30/20 0124  NA 139 138  K 3.9 3.9  CL 106 107  CO2 23 23  GLUCOSE 165* 152*  BUN 18 16  CREATININE 1.22* 1.13*  CALCIUM 8.4* 8.2*    COAG Lab Results  Component Value Date   INR 1.0 09/24/2020   INR 1.0 04/26/2020   INR 1.0 04/07/2020   No results found for: PTT  I have examined the patient, reviewed and agree with above.  Curt Jews, MD 09/30/2020 9:02 AM

## 2020-10-01 ENCOUNTER — Telehealth: Payer: Self-pay | Admitting: Physician Assistant

## 2020-10-01 NOTE — Telephone Encounter (Signed)
  [facility]   MULTIDISCIPLINARY HEART VALVE TEAM  Attempted TOC call. No answer, left VM.    Angelena Form PA-C  [facility]

## 2020-10-04 NOTE — Telephone Encounter (Signed)
[name] [name]) contacted regarding discharge from [facility] on 09/30/2020.  Patient understands to follow up with provider Nell Range PA-C on 10/06/2020 at 2:30 PM at [facility] location. Patient understands discharge instructions? yes Patient understands medications and regiment? yes Patient understands to bring all medications to this visit? Yes  Overall the pt is feeling well but does remain tired.  The pt's groin site is healing with no issues per the pt's daughter.

## 2020-10-05 NOTE — Progress Notes (Signed)
[facility] [place]                                     Cardiology Office Note:    Date:  10/06/2020   ID:  Denise Parrish, DOB 1923/02/02, MRN [id number]  PCP:  Denise Andrea, MD  [facility] Cardiologist:  Denise Rouge, MD / Denise Parrish & Denise Parrish (TAVR) Referring MD: Denise Andrea, MD   [facility] s/p TAVR   History of Present Illness:    Denise Parrish is a 87 y.o. female with a hx of mild dementia, DMT2, GERD, HLD, HTN, obesity, emphysema, hiatal hernia,history of TIA/CVA, carotid artery disease s/p rightCEA 10/2019, complex hospitalization in 04/2020 with GI bleed requiring transfusion, demand ischemia, and mechanical fall in the hospital with a fracture of the left fibula and severe AS s/p TAVR (09/28/20) who presents to clinic for follow up.   She has a history of aortic stenosis followed by Dr. Johnsie Parrish.Her most recent echocardiogram on 06/25/2020 showed a mean gradient of 32 mmHg with a peak gradient of 51 mmHg. Aortic valve area by VTI was 0.6 cm2. She had a difficult hospitalization in August 2021 when she was admitted with a GI bleedand demand ischemia and suffered a mechanical fall in the hospital with fracture of her left fibula. She has made a slow but good recovery from this and feels that she is back at her baseline. Prior to this she was very active going to [place] was able to walk into the stadium.After her fibula fracture she had to use a wheelchair for a while but is now back walking with a cane 4-prong cane. She now reports progressive exertional shortness of breath and fatigue which has been present for a few years but has worsened over the last 6 months and is now occurring with low-level activity. During her work-up for aortic stenosis and consideration of TAVR she underwent cardiac catheterization on 09/08/2020 which showed a 90% left circumflex stenosis and a large caliber  vessel. This was successfully treated with drug-eluting stent. She said that since that procedure her breathing has been much better. Catheterization also showed a 60 to 70% distal RCA stenosis and mild nonobstructive LAD disease. The mean gradient across aortic valve at catheterization was 57 mmHg with a valve area of 0.78 cm2. She was placed on plavix monotherapy given recent GI bleed.   She was evaluated by the multidisciplinary valve team and underwent successful TAVR with a40m Medtronic Evolut Pro+THV via the TF approach on 09/28/20. Post operative echo showed normally functioning TAVR with a mean gradient of 11 mm hg and no PVL. Shortly after TAVR she developed acute right limb ischemia that required emergentexploration and primary repair right femoral artery. She was discharged on POD 2 on monotherapy with plavix.   Today she presents to clinic for follow up. Here with daughter.  Doing well. Hasn't been able to tell a big difference since TAVR. She had the most symptom improvement from PCI. No CP although he does have some "soreness." she does report some mild dyspnea with exertion. No LE edema, orthopnea or PND. No dizziness or syncope. No blood in stool or urine. No palpitations.     Past Medical History:  Diagnosis Date  . Anxiety   . Carotid stenosis, right 10/16/2019   75% on CTA neck  . Cholelithiasis  on u/s 09/2011  . Degenerative joint disease    Right THA in 1995; cervical discectomy and fusion-2001  . Diabetes mellitus, type 2 (HCC)    + neuropathy  . Emphysema   . Gastroesophageal reflux disease   . Hiatal hernia   . History of CVA (cerebrovascular accident)   . Hyperlipidemia   . Hypertension   . IDA (iron deficiency anemia)    labs 09/2011  . Mild cognitive impairment   . Obesity   . Osteoporosis   . Pneumonia   . 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   s/p TAVR with a 69m Medtronic Evolut Pro+ via the TF approach   . Severe  aortic stenosis   . Sigmoid diverticulosis   . Small bowel lesion    On Given's capsule; Dr GKathleene Hazel11/2013, no further w/u needed    Past Surgical History:  Procedure Laterality Date  . ANTERIOR CERVICAL DISCECTOMY  2001   C4-5, allograft, fixation  . BREAST LUMPECTOMY     benign  . CATARACT EXTRACTION W/ INTRAOCULAR LENS IMPLANT  2007   Left  . COLONOSCOPY  2006  . COLONOSCOPY  04/03/12   Dr. ROk Edwardsdiverticulosis, negative microscopic  colitis   . CORONARY STENT INTERVENTION N/A 09/08/2020   Procedure: CORONARY STENT INTERVENTION;  Surgeon: Denise Mocha MD;  [place];  Service: Cardiovascular;  Laterality: N/A;  . ENDARTERECTOMY Right 10/21/2019   Procedure: ENDARTERECTOMY CAROTID RIGHT;  Surgeon: Denise Sandy MD;  [place]  Service: Vascular;  Laterality: Right;  . ESOPHAGOGASTRODUODENOSCOPY  04/03/12   Dr. RJennet Madurohernia, chronic gastritis on bx  . ESOPHAGOGASTRODUODENOSCOPY (EGD) WITH PROPOFOL N/A 04/28/2020   Procedure: ESOPHAGOGASTRODUODENOSCOPY (EGD) WITH PROPOFOL;  Surgeon: Denise Brace MD;  [place]  Service: Gastroenterology;  Laterality: N/A;  . FEMORAL ARTERY EXPLORATION Right 09/28/2020   Procedure: RIGHT GROIN EXPLORATION WITH REPAIR RIGHT COMMON FEMORAL ARTERY;  Surgeon: Denise Posner MD;  [place]  Service: Vascular;  Laterality: Right;  . GIVENS CAPSULE STUDY  05/09/2012   RMR: an unclear raised area of small bowel was noted, with features almost  characteristic of very small polyp. This was without villous  blunting or any evidence of active bleeding; yet, the area of  concern appeared to be erythematous. However, this could simply  be a light reflection on a normal variation of the small bowel. REFERRED TO Denise Parrish, APPT FOR NOVEMBER 2013.   .Marland KitchenGIVENS CAPSULE STUDY N/A 04/30/2020   Procedure: GIVENS CAPSULE STUDY;  Surgeon: Denise Brace MD;  [place] [facility]   Service: Gastroenterology;  Laterality: N/A;  . HIP ARTHROPLASTY Right   . PATCH ANGIOPLASTY Right 10/21/2019   Procedure: PATCH ANGIOPLASTY USING XRueben BashBIOLOGIC PATCH;  Surgeon: Denise Sandy MD;  [place]  Service: Vascular;  Laterality: Right;  . POLYPECTOMY  04/28/2020   Procedure: POLYPECTOMY;  Surgeon: Denise Brace MD;  [facility];  Service: Gastroenterology;;  . RIGHT/LEFT HEART CATH AND CORONARY ANGIOGRAPHY N/A 09/08/2020   Procedure: RIGHT/LEFT HEART CATH AND CORONARY ANGIOGRAPHY;  Surgeon: Denise Mocha MD;  [place];  Service: Cardiovascular;  Laterality: N/A;  . TEE WITHOUT CARDIOVERSION N/A 09/28/2020   Procedure: TRANSESOPHAGEAL ECHOCARDIOGRAM (TEE);  Surgeon: Denise Mocha MD;  [place]  Service: Open Heart Surgery;  Laterality: N/A;  . TONSILLECTOMY    . TOTAL HIP ARTHROPLASTY  1995   Right  . TRANSCATHETER AORTIC VALVE REPLACEMENT, TRANSFEMORAL N/A 09/28/2020  Procedure: TRANSCATHETER AORTIC VALVE REPLACEMENT, TRANSFEMORAL;  Surgeon: Sherren Mocha, MD;  Location: [place];  Service: Open Heart Surgery;  Laterality: N/A;    Current Medications: Current Meds  Medication Sig  . ALPRAZolam (XANAX) 0.5 MG tablet Take 0.5 mg by mouth at bedtime as needed for anxiety.  . Ascorbic Acid (VITAMIN C PO) Take 1 tablet by mouth daily.  [name] atorvastatin (LIPITOR) 40 MG tablet Take 1 tablet (40 mg total) by mouth daily at 6 PM.  . cholecalciferol (VITAMIN D) 25 MCG (1000 UNIT) tablet Take 1,000 Units by mouth daily.  [name] glimepiride (AMARYL) 1 MG tablet Take 1 mg by mouth daily with breakfast.  . Glycerin-Hypromellose-PEG 400 (VISINE DRY EYE OP) Apply 1 drop to eye 3 (three) times daily as needed (for dry eye relief).   . LUTEIN PO Take by mouth daily.  . meclizine (ANTIVERT) 12.5 MG tablet Take 1 tablet (12.5 mg total) by mouth 3 (three) times daily as needed for dizziness.  . metoprolol tartrate (LOPRESSOR) 25 MG  tablet Take 0.5 tablets (12.5 mg total) by mouth 2 (two) times daily.  . Multiple Vitamins-Minerals (PRESERVISION AREDS 2 PO) Take 1 tablet by mouth See admin instructions. Takes 1 tablet in the morning and 1 tablet at night for eyes.  . pantoprazole (PROTONIX) 40 MG tablet Take 1 tablet (40 mg total) by mouth daily.  . vitamin E 180 MG (400 UNITS) capsule Take 400 Units by mouth daily.  . [DISCONTINUED] clopidogrel (PLAVIX) 75 MG tablet Take 1 tablet (75 mg total) by mouth daily with breakfast.     Allergies:   Patient has no known allergies.   Social History   Socioeconomic History  . Marital status: Divorced    Spouse name: Not on file  . Number of children: 4  . Years of education: 6th grade  . Highest education level: Not on file  Occupational History  . Occupation: Retired    Comment: [employer]  Tobacco Use  . Smoking status: Never Smoker  . Smokeless tobacco: Never Used  . Tobacco comment: Quit x 50 years  Vaping Use  . Vaping Use: Never used  Substance and Sexual Activity  . Alcohol use: Never  . Drug use: Never  . Sexual activity: Not Currently  Other Topics Concern  . Not on file  Social History Narrative   Lives alone. Every other week she has family with her. Daughter and son take care of her during the days.    Right-handed.   No daily use of caffeine.             Social Determinants of Health   Financial Resource Strain: Not on file  Food Insecurity: Not on file  Transportation Needs: Not on file  Physical Activity: Not on file  Stress: Not on file  Social Connections: Not on file     Family History: The patient's family history includes CAD in her father; Cancer in her brother; Cirrhosis in her mother; Diabetes in her mother; Heart failure in her father; Hypertension in her father; Uterine cancer in her sister. There is no history of Colon cancer.  ROS:   Please see the history of present illness.    All other systems reviewed and are  negative.  EKGs/Labs/Other Studies Reviewed:    The following studies were reviewed today: Operateive NOTE   Date of Procedure:09/28/2020  Preoperative Diagnosis:Severe Aortic Stenosis   Postoperative Diagnosis:Same   Procedure:   Transcatheter Aortic Valve Replacement - Percutaneous RightTransfemoral Approach Medtronic  Evolut-PRO+(size42m, model # EVPROPLUS-23US, serial #[id number]  Co-Surgeons:Bryan KAlveria Apley MD aDominga Ferry MD   Anesthesiologist:C. JGlennon Mac MD  EAnne Hahn MD  Pre-operative Echo Findings: ? Severe aortic stenosis  ? Normalleft ventricular systolic function  Post-operative Echo Findings: ? Noparavalvular leak ? Normalleft ventricular systolic function ? Normalmeanprosthetic transvalvular gradient of111mHg.  ________________________   Echo1/12/22:  IMPRESSIONS  1. The mitral valve is degenerative. Mild mitral valve regurgitation.  Moderate mitral stenosis. Severe mitral annular calcification.  2. Post TAVR with 23 mm supra annular Medtronic Evolut valve No  significant PVL mean gradient 11 peak 19 mmHg lower than implant DVI 0.69  and AVA 2.1 cm2. The aortic valve has been repaired/replaced.  3. There is severely elevated pulmonary artery systolic pressure.  4. The inferior vena cava is dilated in size with >50% respiratory  variability, suggesting right atrial pressure of 8 mmHg.      EKG:  EKG is ordered today.  The ekg ordered today demonstrates sinus with HR 74  Recent Labs: 09/24/2020: ALT 28 09/29/2020: Magnesium 1.8 09/30/2020: BUN 16; Creatinine, Ser 1.13; Hemoglobin 8.5; Platelets 164; Potassium 3.9; Sodium 138  Recent Lipid Panel    Component Value Date/Time   CHOL 100 04/08/2020 0441   TRIG 83 04/08/2020 0441   HDL 35 (L) 04/08/2020 0441   CHOLHDL 2.9 04/08/2020 0441   VLDL  17 04/08/2020 0441   LDLCALC 48 04/08/2020 0441     Risk Assessment/Calculations:       Physical Exam:    VS:  BP 118/64   Pulse 74   Ht 5' 5"  (1.651 m)   Wt 169 lb 9.6 oz (76.9 kg)   SpO2 96%   BMI 28.22 kg/m     Wt Readings from Last 3 Encounters:  10/06/20 169 lb 9.6 oz (76.9 kg)  09/30/20 137 lb 9.4 oz (62.4 kg)  09/24/20 170 lb 8 oz (77.3 kg)     GEN: Well nourished, well developed in no acute distress, obese HEENT: Normal NECK: No JVD; No carotid bruits LYMPHATICS: No lymphadenopathy CARDIAC: RRR, soft flow murmur @ RUSB. No rubs, gallops RESPIRATORY:  Clear to auscultation without rales, wheezing or rhonchi  ABDOMEN: Soft, non-tender, non-distended MUSCULOSKELETAL:  No edema; No deformity  SKIN: Warm and dry.  Groin sites clear without hematoma or ecchymosis . Right groin with femoral cut down healing well.  NEUROLOGIC:  Alert and oriented x 3 PSYCHIATRIC:  Normal affect   ASSESSMENT:    1. S/P TAVR (transcatheter aortic valve replacement)   2. Ischemic leg   3. Coronary artery disease involving nonautologous biological coronary bypass graft without angina pectoris   4. History of GI bleed    PLAN:    In order of problems listed above:  Severe AS s/p TAVR:groin sites healing well. ECG with no HAVB. Continue monotherapy with  plavix x 6 months then she can switch to a baby aspirin. SBE prophylaxis discussed; the patient is edentulous and does not go to the dentist. She had a previously made apt with Dr. NiJohnsie Canceln ReBlendeext month which is more convenient for her, so that will count as her 1 month appt.   Acute right leg ischemia: shortly after TAVR, pt developed acute right leg ischemia and required emergentexploration and primary repair right femoral artery. Leg back to normal now. Cut down healing well.   CAD: pre TAVR cath showedsevere 90% left circumflex stenosis (large caliber vessel) with successful PCI using a 3.5 x 12 mm resolute Onyx  DES.There  was moderate to severe distal RCA stenosis of 60 to 70%and mild nonobstructive LAD stenosis. Continue medical therapy. Treated with Plavix alone given recent GI bleed. She has had a big symptomatic improvement since PCI.    Hx of GI bleeding with anemia: Hg stable around 8.5. no s/s bleeding.    Medication Adjustments/Labs and Tests Ordered: Current medicines are reviewed at length with the patient today.  Concerns regarding medicines are outlined above.  Orders Placed This Encounter  Procedures  . EKG 12-Lead   Meds ordered this encounter  Medications  . clopidogrel (PLAVIX) 75 MG tablet    Sig: Take 1 tablet (75 mg total) by mouth daily with breakfast.    Dispense:  90 tablet    Refill:  1    Patient Instructions  Medication Instructions:  1) You may STOP PLAVIX 02/26/2021 *If you need a refill on your cardiac medications before your next appointment, please call your pharmacy*  Follow-Up: Please keep your upcoming appointment with Dr. Johnsie Parrish!    Signed, Angelena Form, PA-C  10/06/2020 2:55 PM    [place]

## 2020-10-06 ENCOUNTER — Encounter: Payer: Self-pay | Admitting: Physician Assistant

## 2020-10-06 ENCOUNTER — Encounter: Payer: Medicare Other | Admitting: Surgery

## 2020-10-06 ENCOUNTER — Ambulatory Visit (INDEPENDENT_AMBULATORY_CARE_PROVIDER_SITE_OTHER): Payer: Medicare Other | Admitting: Physician Assistant

## 2020-10-06 ENCOUNTER — Other Ambulatory Visit: Payer: Self-pay

## 2020-10-06 VITALS — BP 118/64 | HR 74 | Ht 65.0 in | Wt 169.6 lb

## 2020-10-06 DIAGNOSIS — I998 Other disorder of circulatory system: Secondary | ICD-10-CM

## 2020-10-06 DIAGNOSIS — Z8719 Personal history of other diseases of the digestive system: Secondary | ICD-10-CM | POA: Diagnosis not present

## 2020-10-06 DIAGNOSIS — Z952 Presence of prosthetic heart valve: Secondary | ICD-10-CM

## 2020-10-06 DIAGNOSIS — I25810 Atherosclerosis of coronary artery bypass graft(s) without angina pectoris: Secondary | ICD-10-CM | POA: Diagnosis not present

## 2020-10-06 MED ORDER — CLOPIDOGREL BISULFATE 75 MG PO TABS: 75.0000 mg | ORAL_TABLET | Freq: Every day | ORAL | 1 refills | Status: DC

## 2020-10-06 NOTE — Patient Instructions (Signed)
Medication Instructions:  1) You may STOP PLAVIX 02/26/2021 *If you need a refill on your cardiac medications before your next appointment, please call your pharmacy*  Follow-Up: Please keep your upcoming appointment with Dr. Johnsie Cancel!

## 2020-10-15 ENCOUNTER — Encounter: Payer: Self-pay | Admitting: Physician Assistant

## 2020-10-15 ENCOUNTER — Ambulatory Visit (INDEPENDENT_AMBULATORY_CARE_PROVIDER_SITE_OTHER): Payer: Medicare Other | Admitting: Physician Assistant

## 2020-10-15 ENCOUNTER — Ambulatory Visit (HOSPITAL_COMMUNITY)
Admission: RE | Admit: 2020-10-15 | Discharge: 2020-10-15 | Disposition: A | Discharge status: Home / Self Care | Payer: Medicare Other | Source: Ambulatory Visit | Attending: Physician Assistant | Admitting: Physician Assistant

## 2020-10-15 ENCOUNTER — Other Ambulatory Visit: Payer: Self-pay

## 2020-10-15 VITALS — BP 127/62 | HR 68 | Temp 98.7°F | Resp 20 | Ht 65.0 in | Wt 165.5 lb

## 2020-10-15 DIAGNOSIS — Z9889 Other specified postprocedural states: Secondary | ICD-10-CM

## 2020-10-15 DIAGNOSIS — I739 Peripheral vascular disease, unspecified: Secondary | ICD-10-CM | POA: Insufficient documentation

## 2020-10-15 NOTE — Progress Notes (Signed)
POST OPERATIVE OFFICE NOTE    CC:  F/u for surgery  HPI:  This is a 87 y.o. female who is s/p TAVR on 09/28/20 by Dr. Cyndia Bent. Following the procedure she began having pain in the right foot and the PACU nurses were unable to obtain doppler signals in her foot. She did not have any signs of hematoma at the femoral access site. On exam Dr. Scot Dock was unable to feel a right femoral pulse or obtain doppler signals in the right foot. A Perclose device was used on the right femoral artery and he suspected a focal dissection. Subsequently she had to undergo urgent exploration. She underwent right femoral exploration and primary repair of her right common femoral artery by Dr. Donnetta Hutching.   Today she presents for follow up and to review non invasive studies. She is not having any surgical site pain. She says she only really notices it when she twists to the right or if she bends over to pick up something. She has been caring for the incision by herself. She says she cant really see it but she has been cleaning with soap and water and applying dry gauze. She denies any pain in her legs or feet. She is not having any pain on ambulation. She does explain that sometimes at night she gets a burning sensation in her feet and has to throw the sheets off for a while. She is concerned she is developing neuropathy. She has known arthritis all over her body so she knows she has pains from time to time.   She otherwise continues to do well following her right CEA by Dr. Donzetta Matters on 11/10/19 for symptomatic carotid artery stenosis. She does report on Tuesday that she has what she thinks is a " mini stroke". She says she got a tingling feeling on the left side of her face, she felt like her left cheek was swollen and that her left lower lip was sticking out. She said this lasted about 24 hours and then resolved. She said this is very similar to when she had a mini stroke in the past. She denies any amaurosis or other visual changes, no  slurred speech or upper or lower extremity weakness or numbness  No Known Allergies  Current Outpatient Medications  Medication Sig Dispense Refill  . ALPRAZolam (XANAX) 0.5 MG tablet Take 0.5 mg by mouth at bedtime as needed for anxiety.    . Ascorbic Acid (VITAMIN C PO) Take 1 tablet by mouth daily.    [name] atorvastatin (LIPITOR) 40 MG tablet Take 1 tablet (40 mg total) by mouth daily at 6 PM. 30 tablet 2  . cholecalciferol (VITAMIN D) 25 MCG (1000 UNIT) tablet Take 1,000 Units by mouth daily.    . clopidogrel (PLAVIX) 75 MG tablet Take 1 tablet (75 mg total) by mouth daily with breakfast. 90 tablet 1  . glimepiride (AMARYL) 1 MG tablet Take 1 mg by mouth daily with breakfast.    . Glycerin-Hypromellose-PEG 400 (VISINE DRY EYE OP) Apply 1 drop to eye 3 (three) times daily as needed (for dry eye relief).     . LUTEIN PO Take by mouth daily.    . meclizine (ANTIVERT) 12.5 MG tablet Take 1 tablet (12.5 mg total) by mouth 3 (three) times daily as needed for dizziness. 20 tablet 0  . metoprolol tartrate (LOPRESSOR) 25 MG tablet Take 0.5 tablets (12.5 mg total) by mouth 2 (two) times daily. 30 tablet 0  . Multiple Vitamins-Minerals (PRESERVISION AREDS 2  PO) Take 1 tablet by mouth See admin instructions. Takes 1 tablet in the morning and 1 tablet at night for eyes.    . pantoprazole (PROTONIX) 40 MG tablet Take 1 tablet (40 mg total) by mouth daily. 90 tablet 3  . vitamin E 180 MG (400 UNITS) capsule Take 400 Units by mouth daily.     No current facility-administered medications for this visit.     ROS:  See HPI  Physical Exam:  Vitals:   10/15/20 1520  BP: 127/62  Pulse: 68  Resp: 20  Temp: 98.7 F (37.1 C)  TempSrc: Temporal  SpO2: 98%  Weight: 165 lb 8 oz (75.1 kg)  Height: 5' 5"  (1.651 m)    Incision: right groin incision is clean, dry and intact. Mild erythema of the proximal aspect of incisions likely just irritated from clothing rubbing and gauze. No drainage. No swelling or  hematoma Extremities: well perfused and warm. Motor and sensation intact. Monophasic DP signals bilaterally. Brisk PT signals bilaterally Neuro: alert and oriented Abdomen:  Obese, soft, non distended, non tender  Non Invasive Vascular lab studies:  09/28/20 Duplex prior to repair +----------+--------+-----+---------------+-------------------+--------+  RIGHT   PSV cm/sRatioStenosis    Waveform      Comments  +----------+--------+-----+---------------+-------------------+--------+  CFA Prox 58   15                          +----------+--------+-----+---------------+-------------------+--------+  CFA Mid  375   130 75-99% stenosismonophasic           +----------+--------+-----+---------------+-------------------+--------+  CFA Distal221   91          monophasic           +----------+--------+-----+---------------+-------------------+--------+  DFA                                   +----------+--------+-----+---------------+-------------------+--------+  SFA Prox 96   31          dampened monophasic      +----------+--------+-----+---------------+-------------------+--------+  SFA Mid  51   17          dampened monophasic      +----------+--------+-----+---------------+-------------------+--------+  SFA Distal30   14          dampened monophasic      +----------+--------+-----+---------------+-------------------+--------+  POP Prox 36   16          dampened monophasic      +----------+--------+-----+---------------+-------------------+--------+  POP Distal27   13          dampened monophasic      +----------+--------+-----+---------------+-------------------+--------+  ATA Distal23   8          dampened monophasic       +----------+--------+-----+---------------+-------------------+--------+  PTA Distal       occluded                    +----------+--------+-----+---------------+-------------------+--------+   10/15/20 +-------+-----------+-----------+------------+------------+  ABI/TBIToday's ABIToday's TBIPrevious ABIPrevious TBI  +-------+-----------+-----------+------------+------------+  Right 0.75    0.30                  +-------+-----------+-----------+------------+------------+  Left  0.92    0.22                  +-------+-----------+-----------+------------+------------+    Assessment/Plan:  This is a 87 y.o. female who is s/p exploration and primary repair of her right common femoral artery following TAVR. She is doing well  post op. Her right groin incision is healing well. I have recommended she continue to clean with soap and water and apply a dry gauze. She is not having any lower extremity rest pain or claudication.She likely has some underlying tibial artery disease. We have no prior non invasive studies to compare to today. She has a toe pressure of 54 mmHg on the right and 40 mm Hg on the left. Her ABI shows moderate disease on the right and mild on the left. Her lower extremities are well perfused and warm with doppler signals - She likely had a recent TIA. Her last carotid duplex showed patent right ICA following CEA and the left with 1-49% stenosis. She has history of CVA and TIAs in the past -Advised her to go to the ER if she has recurrence of TIA/ Stroke like symptoms - She will be due for repeat carotid duplex in 1 year - She will continue her Plavix and Statin - I will have her follow up in 3 months with ABI but advised patient and her daughter to call for earlier follow up if she has concerns about her incision healing or if she has new or concerning lower extremity symptoms   Karoline Caldwell,  PA-C [facility] [phone]  Clinic MD: Dr. Donzetta Matters

## 2020-10-18 ENCOUNTER — Other Ambulatory Visit: Payer: Self-pay

## 2020-10-18 DIAGNOSIS — I739 Peripheral vascular disease, unspecified: Secondary | ICD-10-CM

## 2020-10-20 ENCOUNTER — Ambulatory Visit: Payer: Medicare Other | Admitting: Physician Assistant

## 2020-10-21 NOTE — Progress Notes (Signed)
Cardiology Office Note:    Date:  10/29/2020   ID:  Denise Parrish, Denise Parrish 06/10/1923, MRN [id number]  PCP:  Denise Andrea, MD  [facility] Cardiologist:  Denise Rouge, MD / Denise Parrish & Dr. Cyndia Parrish (TAVR) Referring MD: Denise Andrea, MD    History of Present Illness:    87 y.o. with history of dementia, DM-2, GERD, HLD, HTN , emphysema, CVA with right CEA 05/9241 Complicated hospitalization August 2021 with GI bleed fall fractured left fibular. Developed progressive symptomatic severe AS .   She underwent cardiac catheterization on 09/08/2020 which showed a 90% left circumflex stenosis and a large caliber vessel. This was successfully treated with drug-eluting stent. She said that since that procedure her breathing has been much better. Catheterization also showed a 60 to 70% distal RCA stenosis and mild nonobstructive LAD disease. The mean gradient across aortic valve at catheterization was 57 mmHg with a valve area of 0.78 cm2. She was placed on plavix monotherapy given recent GI bleed.   Had successful TAVR with a55m Medtronic Evolut Pro+THV via the TF approach on 09/28/20. Post operative echo showed normally functioning TAVR with a mean gradient of 11 mm hg and no PVL. Shortly after TAVR she developed acute right limb ischemia that required emergentexploration and primary repair right femoral artery. She was discharged on POD 2 on monotherapy with plavix.   Recovering her strength NYHA class 2 now .     Past Medical History:  Diagnosis Date  . Anxiety   . Carotid stenosis, right 10/16/2019   75% on CTA neck  . Cholelithiasis    on u/s 09/2011  . Degenerative joint disease    Right THA in 1995; cervical discectomy and fusion-2001  . Diabetes mellitus, type 2 (HCC)    + neuropathy  . Emphysema   . Gastroesophageal reflux disease   . Hiatal hernia   . History of CVA (cerebrovascular accident)   . Hyperlipidemia   .  Hypertension   . IDA (iron deficiency anemia)    labs 09/2011  . Mild cognitive impairment   . Obesity   . Osteoporosis   . Pneumonia   . 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 Severe aortic stenosis   . Sigmoid diverticulosis   . Small bowel lesion    On Given's capsule; Dr Denise Hazel1/2013, no further w/u needed    Past Surgical History:  Procedure Laterality Date  . ANTERIOR CERVICAL DISCECTOMY  2001   C4-5, allograft, fixation  . BREAST LUMPECTOMY     benign  . CATARACT EXTRACTION W/ INTRAOCULAR LENS IMPLANT  2007   Left  . COLONOSCOPY  2006  . COLONOSCOPY  04/03/12   Dr. RoOk Parrish, negative microscopic  colitis   . CORONARY STENT INTERVENTION N/A 09/08/2020   Procedure: CORONARY STENT INTERVENTION;  Surgeon: CoSherren MochaMD;  [facility];  Service: Cardiovascular;  Laterality: N/A;  . ENDARTERECTOMY Right 10/21/2019   Procedure: ENDARTERECTOMY CAROTID RIGHT;  Surgeon: Denise SandyMD;  [facility];  Laterality: Right;  . ESOPHAGOGASTRODUODENOSCOPY  04/03/12   Dr. RoJennet Maduroernia, chronic gastritis on bx  . ESOPHAGOGASTRODUODENOSCOPY (EGD) WITH  PROPOFOL N/A 04/28/2020   Procedure: ESOPHAGOGASTRODUODENOSCOPY (EGD) WITH PROPOFOL;  Surgeon: Denise Brace, MD;  Location: [place];  Service: Gastroenterology;  Laterality: N/A;  . FEMORAL ARTERY EXPLORATION Right 09/28/2020   Procedure: RIGHT GROIN EXPLORATION WITH REPAIR RIGHT COMMON FEMORAL ARTERY;  Surgeon: Denise Posner, MD;  Location: [place];  Service: Vascular;  Laterality: Right;  . GIVENS CAPSULE STUDY  05/09/2012   RMR: an unclear raised area of small bowel was noted, with features almost  characteristic of very small polyp. This was without villous  blunting or any evidence of active bleeding; yet, the area of  concern appeared to be  erythematous. However, this could simply  be a light reflection on a normal variation of the small bowel. REFERRED TO DR. GILLIAM, APPT FOR NOVEMBER 2013.   [name] GIVENS CAPSULE STUDY N/A 04/30/2020   Procedure: GIVENS CAPSULE STUDY;  Surgeon: Denise Brace, MD;  Location: [place];  Service: Gastroenterology;  Laterality: N/A;  . HIP ARTHROPLASTY Right   . PATCH ANGIOPLASTY Right 10/21/2019   Procedure: PATCH ANGIOPLASTY USING Rueben Bash BIOLOGIC PATCH;  Surgeon: Denise Sandy, MD;  Location: [place];  Service: Vascular;  Laterality: Right;  . POLYPECTOMY  04/28/2020   Procedure: POLYPECTOMY;  Surgeon: Denise Brace, MD;  Location: [place] [facility];  Service: Gastroenterology;;  . RIGHT/LEFT HEART CATH AND CORONARY ANGIOGRAPHY N/A 09/08/2020   Procedure: RIGHT/LEFT HEART CATH AND CORONARY ANGIOGRAPHY;  Surgeon: Denise Mocha, MD;  Location: [place] [facility];  Service: Cardiovascular;  Laterality: N/A;  . TEE WITHOUT CARDIOVERSION N/A 09/28/2020   Procedure: TRANSESOPHAGEAL ECHOCARDIOGRAM (TEE);  Surgeon: Denise Mocha, MD;  Location: [place];  Service: Open Heart Surgery;  Laterality: N/A;  . TONSILLECTOMY    . TOTAL HIP ARTHROPLASTY  1995   Right  . TRANSCATHETER AORTIC VALVE REPLACEMENT, TRANSFEMORAL N/A 09/28/2020   Procedure: TRANSCATHETER AORTIC VALVE REPLACEMENT, TRANSFEMORAL;  Surgeon: Denise Mocha, MD;  Location: [place];  Service: Open Heart Surgery;  Laterality: N/A;    Current Medications: Current Meds  Medication Sig  . ALPRAZolam (XANAX) 0.5 MG tablet Take 0.5 mg by mouth at bedtime as needed for anxiety.  . Ascorbic Acid (VITAMIN C PO) Take 1 tablet by mouth daily.  [name] atorvastatin (LIPITOR) 40 MG tablet Take 1 tablet (40 mg total) by mouth daily at 6 PM.  . cholecalciferol (VITAMIN D) 25 MCG (1000 UNIT) tablet Take 1,000 Units by mouth daily.  . clopidogrel (PLAVIX) 75 MG tablet Take 1 tablet (75 mg total) by mouth daily with breakfast.  . glimepiride (AMARYL) 1  MG tablet Take 1 mg by mouth daily with breakfast.  . Glycerin-Hypromellose-PEG 400 (VISINE DRY EYE OP) Apply 1 drop to eye 3 (three) times daily as needed (for dry eye relief).   . LUTEIN PO Take by mouth daily.  . meclizine (ANTIVERT) 12.5 MG tablet Take 1 tablet (12.5 mg total) by mouth 3 (three) times daily as needed for dizziness.  . metoprolol tartrate (LOPRESSOR) 25 MG tablet Take 0.5 tablets (12.5 mg total) by mouth 2 (two) times daily.  . Multiple Vitamins-Minerals (PRESERVISION AREDS 2 PO) Take 1 tablet by mouth See admin instructions. Takes 1 tablet in the morning and 1 tablet at night for eyes.  . pantoprazole (PROTONIX) 40 MG tablet Take 1 tablet (40 mg total) by mouth daily.  . vitamin E 180 MG (400 UNITS) capsule Take 400 Units by mouth daily.     Allergies:   Patient has no known allergies.   Social History  Socioeconomic History  . Marital status: Divorced    Spouse name: Not on file  . Number of children: 4  . Years of education: 6th grade  . Highest education level: Not on file  Occupational History  . Occupation: Retired    Comment: [employer]  Tobacco Use  . Smoking status: Never Smoker  . Smokeless tobacco: Never Used  . Tobacco comment: Quit x 50 years  Vaping Use  . Vaping Use: Never used  Substance and Sexual Activity  . Alcohol use: Never  . Drug use: Never  . Sexual activity: Not Currently  Other Topics Concern  . Not on file  Social History Narrative   Lives alone. Every other week she has family with her. Daughter and son take care of her during the days.    Right-handed.   No daily use of caffeine.             Social Determinants of Health   Financial Resource Strain: Not on file  Food Insecurity: Not on file  Transportation Needs: Not on file  Physical Activity: Not on file  Stress: Not on file  Social Connections: Not on file     Family History: The patient's family history includes CAD in her father; Cancer in her brother; Cirrhosis in  her mother; Diabetes in her mother; Heart failure in her father; Hypertension in her father; Uterine cancer in her sister. There is no history of Colon cancer.  ROS:   Please see the history of present illness.    All other systems reviewed and are negative.  EKGs/Labs/Other Studies Reviewed:    The following studies were reviewed today: Operateive NOTE   Date of Procedure:09/28/2020  Preoperative Diagnosis:Severe Aortic Stenosis   Postoperative Diagnosis:Same   Procedure:   Transcatheter Aortic Valve Replacement - Percutaneous RightTransfemoral Approach Medtronic Evolut-PRO+(size44m, model # EVPROPLUS-23US, serial ##[id number]  Co-Surgeons:Bryan KAlveria Apley MD aDominga Ferry MD   Anesthesiologist:C. JGlennon Mac MD  EAnne Hahn MD  Pre-operative Echo Findings: ? Severe aortic stenosis  ? Normalleft ventricular systolic function  Post-operative Echo Findings: ? Noparavalvular leak ? Normalleft ventricular systolic function ? Normalmeanprosthetic transvalvular gradient of172mHg.  ________________________   Echo1/12/22:  IMPRESSIONS  1. The mitral valve is degenerative. Mild mitral valve regurgitation.  Moderate mitral stenosis. Severe mitral annular calcification.  2. Post TAVR with 23 mm supra annular Medtronic Evolut valve No  significant PVL mean gradient 11 peak 19 mmHg lower than implant DVI 0.69  and AVA 2.1 cm2. The aortic valve has been repaired/replaced.  3. There is severely elevated pulmonary artery systolic pressure.  4. The inferior vena cava is dilated in size with >50% respiratory  variability, suggesting right atrial pressure of 8 mmHg.      EKG:   sinus with HR 74  Recent Labs: 09/24/2020: ALT 28 09/29/2020: Magnesium 1.8 09/30/2020: BUN 16; Creatinine, Ser 1.13; Hemoglobin 8.5; Platelets 164;  Potassium 3.9; Sodium 138  Recent Lipid Panel    Component Value Date/Time   CHOL 100 04/08/2020 0441   TRIG 83 04/08/2020 0441   HDL 35 (L) 04/08/2020 0441   CHOLHDL 2.9 04/08/2020 0441   VLDL 17 04/08/2020 0441   LDLCALC 48 04/08/2020 0441     Risk Assessment/Calculations:       Physical Exam:    VS:  BP (!) 126/52   Pulse 85   Ht 5' 5"  (1.651 m)   Wt 76.7 kg   SpO2 98%   BMI 28.12 kg/m     Wt Readings  from Last 3 Encounters:  10/29/20 76.7 kg  10/15/20 75.1 kg  10/06/20 76.9 kg    Affect appropriate Overweight black female  HEENT: normal Neck supple with no adenopathy JVP normal post right CEA  no thyromegaly Lungs clear with no wheezing and good diaphragmatic motion Heart:  S1/S2 SEM no AR  murmur, no rub, gallop or click PMI normal Abdomen: benighn, BS positve, no tenderness, no AAA Post RFA cut down  Distal pulses intact with no bruits No edema Neuro non-focal Skin warm and dry No muscular weakness  ASSESSMENT:    No diagnosis found. PLAN:    In order of problems listed above:  Severe AS s/p TAVR:good function on TTE 09/29/20  Mean gradient 11 peak 19 mmHg DVI 0.69 no PVL 23 mm Medtronic Evolut Pro valve SBE prophylaxis Transitioned to plavix only monoRx due to anemia  Acute right leg ischemia: shortly after TAVR, post surgical repair healed With good distal pulses ? Dissection FA from Perclose device F/U Dr Early  Carotid:  Post right CEA patent by duplex 09/23/20   CAD: pre TAVR cath showedsevere 90% left circumflex stenosis (large caliber vessel) with successful PCI using a 3.5 x 12 mm resolute Onyx DES.There was moderate to severe distal RCA stenosis of 60 to 70%and mild nonobstructive LAD stenosis. Continue medical therapy. Treated with Plavix alone given recent GI bleed. She has had a big symptomatic improvement since PCI.    Hx of GI bleeding with anemia: Hg stable around 8.5. no s/s bleeding.Last transfusion 10/01/20   Plavix monoRx   F/U VVS for carotid disease and RFA repair F/U with Nell Range Structural PA 6 months and me in a year     Medication Adjustments/Labs and Tests Ordered: Current medicines are reviewed at length with the patient today.  Concerns regarding medicines are outlined above.  No orders of the defined types were placed in this encounter.  No orders of the defined types were placed in this encounter.   There are no Patient Instructions on file for this visit.   Signed, Denise Rouge, MD  10/29/2020 1:55 PM    [place]

## 2020-10-25 ENCOUNTER — Ambulatory Visit (HOSPITAL_COMMUNITY)
Admission: RE | Admit: 2020-10-25 | Discharge: 2020-10-25 | Disposition: A | Discharge status: Home / Self Care | Payer: Medicare Other | Source: Ambulatory Visit | Attending: Physician Assistant | Admitting: Physician Assistant

## 2020-10-25 ENCOUNTER — Other Ambulatory Visit: Payer: Self-pay

## 2020-10-25 DIAGNOSIS — Z952 Presence of prosthetic heart valve: Secondary | ICD-10-CM | POA: Diagnosis not present

## 2020-10-25 LAB — ECHOCARDIOGRAM COMPLETE
AR max vel: 2.07 cm2
AV Area VTI: 1.95 cm2
AV Area mean vel: 1.98 cm2
AV Mean grad: 8.3 mmHg
AV Peak grad: 14.1 mmHg
Ao pk vel: 1.88 m/s
Area-P 1/2: 1.72 cm2
MV VTI: 1.27 cm2
S' Lateral: 2 cm

## 2020-10-25 NOTE — Progress Notes (Signed)
*  PRELIMINARY RESULTS* Echocardiogram 2D Echocardiogram has been performed.  Samuel Germany 10/25/2020, 2:34 PM

## 2020-10-29 ENCOUNTER — Encounter: Payer: Self-pay | Admitting: Cardiovascular Disease

## 2020-10-29 ENCOUNTER — Other Ambulatory Visit: Payer: Self-pay

## 2020-10-29 ENCOUNTER — Ambulatory Visit (INDEPENDENT_AMBULATORY_CARE_PROVIDER_SITE_OTHER): Payer: Medicare Other | Admitting: Cardiovascular Disease

## 2020-10-29 VITALS — BP 126/52 | HR 85 | Ht 65.0 in | Wt 169.0 lb

## 2020-10-29 DIAGNOSIS — Z952 Presence of prosthetic heart valve: Secondary | ICD-10-CM | POA: Diagnosis not present

## 2020-10-29 NOTE — Patient Instructions (Signed)
Medication Instructions:  Your physician recommends that you continue on your current medications as directed. Please refer to the Current Medication list given to you today.  *If you need a refill on your cardiac medications before your next appointment, please call your pharmacy*   Lab Work: NONE   If you have labs (blood work) drawn today and your tests are completely normal, you will receive your results only by: [name] MyChart Message (if you have MyChart) OR . A paper copy in the mail If you have any lab test that is abnormal or we need to change your treatment, we will call you to review the results.   Testing/Procedures: NONE    Follow-Up: At [facility], you and your health needs are our priority.  As part of our continuing mission to provide you with exceptional heart care, we have created designated Provider Care Teams.  These Care Teams include your primary Cardiologist (physician) and Advanced Practice Providers (APPs -  Physician Assistants and Nurse Practitioners) who all work together to provide you with the care you need, when you need it.  We recommend signing up for the patient portal called "MyChart".  Sign up information is provided on this After Visit Summary.  MyChart is used to connect with patients for Virtual Visits (Telemedicine).  Patients are able to view lab/test results, encounter notes, upcoming appointments, etc.  Non-urgent messages can be sent to your provider as well.   To learn more about what you can do with MyChart, go to [email or web address].    Your next appointment:   1 year(s)  The format for your next appointment:   In Person  Provider:   Jenkins Rouge, MD   Other Instructions Thank you for choosing [facility]!

## 2020-11-11 ENCOUNTER — Encounter (HOSPITAL_COMMUNITY): Payer: Self-pay

## 2020-11-11 ENCOUNTER — Encounter (HOSPITAL_COMMUNITY)
Admission: RE | Admit: 2020-11-11 | Discharge: 2020-11-11 | Disposition: A | Discharge status: Home / Self Care | Payer: Medicare Other | Source: Ambulatory Visit | Attending: Cardiovascular Disease | Admitting: Cardiovascular Disease

## 2020-11-11 ENCOUNTER — Other Ambulatory Visit: Payer: Self-pay

## 2020-11-11 VITALS — BP 140/58 | HR 100 | Ht 65.0 in | Wt 167.3 lb

## 2020-11-11 DIAGNOSIS — Z955 Presence of coronary angioplasty implant and graft: Secondary | ICD-10-CM

## 2020-11-11 DIAGNOSIS — Z952 Presence of prosthetic heart valve: Secondary | ICD-10-CM | POA: Diagnosis present

## 2020-11-11 DIAGNOSIS — Z79899 Other long term (current) drug therapy: Secondary | ICD-10-CM | POA: Insufficient documentation

## 2020-11-11 LAB — GLUCOSE, CAPILLARY
Glucose-Capillary: 101 mg/dL — ABNORMAL HIGH (ref 70–99)
Glucose-Capillary: 91 mg/dL (ref 70–99)
Glucose-Capillary: 139 mg/dL — ABNORMAL HIGH (ref 70–99)

## 2020-11-11 NOTE — Progress Notes (Signed)
Cardiac Individual Treatment Plan  Patient Details  Name: Denise Parrish MRN: [id number] Date of Birth: 1923/09/11 Referring Provider:   Flowsheet Row CARDIAC REHAB PHASE II ORIENTATION from 11/11/2020 in [place]  Referring Provider Dr. Johnsie Cancel      Initial Encounter Date:  Flowsheet Row CARDIAC REHAB PHASE II ORIENTATION from 11/11/2020 in [place]  Date 11/11/20      Visit Diagnosis: S/P coronary artery stent placement  S/P TAVR (transcatheter aortic valve replacement)  Patient's Home Medications on Admission:  Current Outpatient Medications:  .  ALPRAZolam (XANAX) 0.5 MG tablet, Take 0.5 mg by mouth at bedtime as needed for anxiety., Disp: , Rfl:  .  Ascorbic Acid (VITAMIN C PO), Take 1 tablet by mouth daily., Disp: , Rfl:  .  atorvastatin (LIPITOR) 40 MG tablet, Take 1 tablet (40 mg total) by mouth daily at 6 PM., Disp: 30 tablet, Rfl: 2 .  cholecalciferol (VITAMIN D) 25 MCG (1000 UNIT) tablet, Take 1,000 Units by mouth daily., Disp: , Rfl:  .  clopidogrel (PLAVIX) 75 MG tablet, Take 1 tablet (75 mg total) by mouth daily with breakfast., Disp: 90 tablet, Rfl: 1 .  glimepiride (AMARYL) 1 MG tablet, Take 1 mg by mouth daily with breakfast., Disp: , Rfl:  .  Glycerin-Hypromellose-PEG 400 (VISINE DRY EYE OP), Apply 1 drop to eye 3 (three) times daily as needed (for dry eye relief). , Disp: , Rfl:  .  LUTEIN PO, Take 1 capsule by mouth daily., Disp: , Rfl:  .  meclizine (ANTIVERT) 12.5 MG tablet, Take 1 tablet (12.5 mg total) by mouth 3 (three) times daily as needed for dizziness., Disp: 20 tablet, Rfl: 0 .  metoprolol tartrate (LOPRESSOR) 25 MG tablet, Take 0.5 tablets (12.5 mg total) by mouth 2 (two) times daily., Disp: 30 tablet, Rfl: 0 .  Multiple Vitamins-Minerals (PRESERVISION AREDS 2 PO), Take 1 tablet by mouth 2 (two) times daily. Takes 1 tablet in the morning and 1 tablet at night for eyes., Disp: , Rfl:  .  pantoprazole  (PROTONIX) 40 MG tablet, Take 1 tablet (40 mg total) by mouth daily., Disp: 90 tablet, Rfl: 3  Past Medical History: Past Medical History:  Diagnosis Date  . Anxiety   . Carotid stenosis, right 10/16/2019   75% on CTA neck  . Cholelithiasis    on u/s 09/2011  . Degenerative joint disease    Right THA in 1995; cervical discectomy and fusion-2001  . Diabetes mellitus, type 2 (HCC)    + neuropathy  . Emphysema   . Gastroesophageal reflux disease   . Hiatal hernia   . History of CVA (cerebrovascular accident)   . Hyperlipidemia   . Hypertension   . IDA (iron deficiency anemia)    labs 09/2011  . Mild cognitive impairment   . Obesity   . Osteoporosis   . Pneumonia   . 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 Severe aortic stenosis   . Sigmoid diverticulosis   . Small bowel lesion    On Given's capsule; Dr GKathleene Hazel11/2013, no further w/u needed    Tobacco Use: Social History   Tobacco Use  Smoking Status Never Smoker  Smokeless Tobacco Never Used  Tobacco Comment   Quit x 50 years    Labs: Recent Review [employer], physiological   Labs for ITP Cardiac and Pulmonary Rehab Latest Ref  Rng & Units 09/08/2020 09/24/2020 09/28/2020 09/28/2020 09/28/2020   Cholestrol 0 - 200 mg/dL - - - - -   LDLCALC 0 - 99 mg/dL - - - - -   HDL >40 mg/dL - - - - -   Trlycerides <150 mg/dL - - - - -   Hemoglobin A1c 4.8 - 5.6 % - - - - -   PHART 7.350 - 7.450 7.355 7.392 - - -   PCO2ART 32.0 - 48.0 mmHg 47.0 41.0 - - -   HCO3 20.0 - 28.0 mmol/L 26.2 24.4 - - -   TCO2 22 - 32 mmol/L 28 - 25 26 24    ACIDBASEDEF 0.0 - 2.0 mmol/L - - - - -   O2SAT % 97.0 97.9 - - -      Capillary Blood Glucose: Lab Results  Component Value Date   GLUCAP 139 (H) 11/11/2020   GLUCAP 101 (H) 11/11/2020   GLUCAP 91 11/11/2020   GLUCAP 114 (H) 09/30/2020   GLUCAP 150 (H) 09/29/2020     Exercise Target Goals: Exercise  Program Goal: Individual exercise prescription set using results from initial 6 min walk test and THRR while considering  patient's activity barriers and safety.   Exercise Prescription Goal: Starting with aerobic activity 30 plus minutes a day, 3 days per week for initial exercise prescription. Provide home exercise prescription and guidelines that participant acknowledges understanding prior to discharge.  Activity Barriers & Risk Stratification:   6 Minute Walk:  6 Minute Walk    Row Name 11/11/20 1510         6 Minute Walk   Phase Initial     Distance 400 feet     Walk Time 6 minutes     # of Rest Breaks 3     MPH 0.8     METS 0.17     RPE 15     VO2 Peak 0.59     Symptoms Yes (comment)     Comments shortness of breath and leg fatigue     Resting HR 80 bpm     Resting BP 140/58     Resting Oxygen Saturation  99 %     Exercise Oxygen Saturation  during 6 min walk 98 %     Max Ex. HR 101 bpm     Max Ex. BP 164/52     2 Minute Post BP 138/54            Oxygen Initial Assessment:   Oxygen Re-Evaluation:   Oxygen Discharge (Final Oxygen Re-Evaluation):   Initial Exercise Prescription:  Initial Exercise Prescription - 11/11/20 1500      Date of Initial Exercise RX and Referring Provider   Date 11/11/20    Referring Provider Dr. Johnsie Cancel    Expected Discharge Date 02/02/21      NuStep   Level 1    SPM 60    Minutes 17      Arm Ergometer   Level 1    RPM 40    Minutes 22      Prescription Details   Frequency (times per week) 3      Intensity   THRR 40-80% of Max Heartrate 50-100    Ratings of Perceived Exertion 11-13      Progression   Progression Continue to progress workloads to maintain intensity without signs/symptoms of physical distress.      Resistance Training   Training Prescription Yes    Weight 1    Reps  10-15           Perform Capillary Blood Glucose checks as needed.  Exercise Prescription Changes:   Exercise  Comments:   Exercise Goals and Review:   Exercise Goals    Row Name 11/11/20 1514             Exercise Goals   Increase Physical Activity Yes       Intervention Provide advice, education, support and counseling about physical activity/exercise needs.;Develop an individualized exercise prescription for aerobic and resistive training based on initial evaluation findings, risk stratification, comorbidities and participant's personal goals.       Expected Outcomes Short Term: Attend rehab on a regular basis to increase amount of physical activity.;Long Term: Add in home exercise to make exercise part of routine and to increase amount of physical activity.;Long Term: Exercising regularly at least 3-5 days a week.       Increase Strength and Stamina Yes       Intervention Provide advice, education, support and counseling about physical activity/exercise needs.;Develop an individualized exercise prescription for aerobic and resistive training based on initial evaluation findings, risk stratification, comorbidities and participant's personal goals.       Expected Outcomes Short Term: Increase workloads from initial exercise prescription for resistance, speed, and METs.;Short Term: Perform resistance training exercises routinely during rehab and add in resistance training at home;Long Term: Improve cardiorespiratory fitness, muscular endurance and strength as measured by increased METs and functional capacity (6MWT)       Able to understand and use rate of perceived exertion (RPE) scale Yes       Intervention Provide education and explanation on how to use RPE scale       Expected Outcomes Short Term: Able to use RPE daily in rehab to express subjective intensity level;Long Term:  Able to use RPE to guide intensity level when exercising independently       Knowledge and understanding of Target Heart Rate Range (THRR) Yes       Intervention Provide education and explanation of THRR including how the  numbers were predicted and where they are located for reference       Expected Outcomes Short Term: Able to state/look up THRR;Long Term: Able to use THRR to govern intensity when exercising independently;Short Term: Able to use daily as guideline for intensity in rehab       Able to check pulse independently Yes       Intervention Provide education and demonstration on how to check pulse in carotid and radial arteries.;Review the importance of being able to check your own pulse for safety during independent exercise       Expected Outcomes Short Term: Able to explain why pulse checking is important during independent exercise;Long Term: Able to check pulse independently and accurately       Understanding of Exercise Prescription Yes       Intervention Provide education, explanation, and written materials on patient's individual exercise prescription       Expected Outcomes Long Term: Able to explain home exercise prescription to exercise independently;Short Term: Able to explain program exercise prescription              Exercise Goals Re-Evaluation :    Discharge Exercise Prescription (Final Exercise Prescription Changes):   Nutrition:  Target Goals: Understanding of nutrition guidelines, daily intake of sodium <1521m, cholesterol <2057m calories 30% from fat and 7% or less from saturated fats, daily to have 5 or more servings of fruits and  vegetables.  Biometrics:  Pre Biometrics - 11/11/20 1514      Pre Biometrics   Height 5' 5"  (1.651 m)    Weight 75.9 kg    Waist Circumference 42 inches    Hip Circumference 44.5 inches    Waist to Hip Ratio 0.94 %    BMI (Calculated) 27.85    Triceps Skinfold 18 mm    % Body Fat 41.4 %    Grip Strength 14.3 kg    Flexibility 0 in    Single Leg Stand 1.57 seconds            Nutrition Therapy Plan and Nutrition Goals:  Nutrition Therapy & Goals - 11/11/20 1445      Personal Nutrition Goals   Comments Patient scored 23 on her diet  assessment. She says she follows a low sugar diet at times but mainly eats what she wants and is not interested in changing her diet at this point in her life. Score reviewed with patient and her daughter who provides her meals. Also provided and discussed handouts regarding making healthier choices and diabetic information.      Intervention Plan   Intervention Nutrition handout(s) given to patient.           Nutrition Assessments:  Nutrition Assessments - 11/11/20 1446      MEDFICTS Scores   Pre Score 23          MEDIFICTS Score Key:  ?70 Need to make dietary changes   40-70 Heart Healthy Diet  ? 40 Therapeutic Level Cholesterol Diet   Picture Your Plate Scores:  <38 Unhealthy dietary pattern with much room for improvement.  41-50 Dietary pattern unlikely to meet recommendations for good health and room for improvement.  51-60 More healthful dietary pattern, with some room for improvement.   >60 Healthy dietary pattern, although there may be some specific behaviors that could be improved.    Nutrition Goals Re-Evaluation:   Nutrition Goals Discharge (Final Nutrition Goals Re-Evaluation):   Psychosocial: Target Goals: Acknowledge presence or absence of significant depression and/or stress, maximize coping skills, provide positive support system. Participant is able to verbalize types and ability to use techniques and skills needed for reducing stress and depression.  Initial Review & Psychosocial Screening:  Initial Psych Review & Screening - 11/11/20 1450      Initial Review   Current issues with Current Anxiety/Panic      Family Dynamics   Good Support System? Yes    Comments Patient lives alone. She has 2 daughters that share responsibility to check on her everyday and provide her meals. She has several grandchildren that she stays in touch with and a lot of friends. She is still able to be independent with her ADL's including her medications. She has a very  positive outlook and is looking forward to the program. She does have anxiety which she says has worsened over the past few years. She does take Alprazolam 0.5 mg at bedtime to help her sleep but says she is going to talk to her PCP about getting on something during the day as needed for her "nervous feelings'.      Barriers   Psychosocial barriers to participate in program There are no identifiable barriers or psychosocial needs.;The patient should benefit from training in stress management and relaxation.      Screening Interventions   Expected Outcomes Short Term goal: Identification and review with participant of any Quality of Life or Depression concerns found by scoring  the questionnaire.           Quality of Life Scores:  Quality of Life - 11/11/20 1516      Quality of Life   Select Quality of Life      Quality of Life Scores   Health/Function Pre 22.18 %    Socioeconomic Pre 27.25 %    Psych/Spiritual Pre 24.21 %    Family Pre 25.5 %    GLOBAL Pre 24.05 %          Scores of 19 and below usually indicate a poorer quality of life in these areas.  A difference of  2-3 points is a clinically meaningful difference.  A difference of 2-3 points in the total score of the Quality of Life Index has been associated with significant improvement in overall quality of life, self-image, physical symptoms, and general health in studies assessing change in quality of life.  PHQ-9: Recent Review Flowsheet Data    Depression screen [facility] 2/9 11/11/2020   Decreased Interest 0   Down, Depressed, Hopeless 0   PHQ - 2 Score 0   Altered sleeping 3    Tired, decreased energy 2   Change in appetite 0   Feeling bad or failure about yourself  0   Trouble concentrating 0   Moving slowly or fidgety/restless 0   Suicidal thoughts 0   PHQ-9 Score 5   Difficult doing work/chores Somewhat difficult     Interpretation of Total Score  Total Score Depression Severity:  1-4 = Minimal depression, 5-9  = Mild depression, 10-14 = Moderate depression, 15-19 = Moderately severe depression, 20-27 = Severe depression   Psychosocial Evaluation and Intervention:  Psychosocial Evaluation - 11/11/20 1454      Psychosocial Evaluation & Interventions   Interventions Stress management education;Relaxation education;Encouraged to exercise with the program and follow exercise prescription    Comments Patient's initial QOL score was 24.05 overall and her PHQ-9 score was 5. She does have anxiety which is managed at night with Alprazolam. She plans to speak with her PCP about prescribing something during the day as needed when she feels anxious. Will continue to monitor.    Expected Outcomes Patient's anxiety will be managed at discharge with no additional psychosocial issued identified.    Continue Psychosocial Services  No Follow up required           Psychosocial Re-Evaluation:   Psychosocial Discharge (Final Psychosocial Re-Evaluation):   Vocational Rehabilitation: Provide vocational rehab assistance to qualifying candidates.   Vocational Rehab Evaluation & Intervention:  Vocational Rehab - 11/11/20 1447      Initial Vocational Rehab Evaluation & Intervention   Assessment shows need for Vocational Rehabilitation No      Vocational Rehab Re-Evaulation   Comments Patient is retired and is not interested in returning to work and does not need vocational rehab.           Education: Education Goals: Education classes will be provided on a weekly basis, covering required topics. Participant will state understanding/return demonstration of topics presented.  Learning Barriers/Preferences:  Learning Barriers/Preferences - 11/11/20 1446      Learning Barriers/Preferences   Learning Barriers Sight    Learning Preferences Audio           Education Topics: Hypertension, Hypertension Reduction -Define heart disease and high blood pressure. Discus how high blood pressure affects the body  and ways to reduce high blood pressure.   Exercise and Your Heart -Discuss why it is important to  exercise, the FITT principles of exercise, normal and abnormal responses to exercise, and how to exercise safely.   Angina -Discuss definition of angina, causes of angina, treatment of angina, and how to decrease risk of having angina.   Cardiac Medications -Review what the following cardiac medications are used for, how they affect the body, and side effects that may occur when taking the medications.  Medications include Aspirin, Beta blockers, calcium channel blockers, ACE Inhibitors, angiotensin receptor blockers, diuretics, digoxin, and antihyperlipidemics.   Congestive Heart Failure -Discuss the definition of CHF, how to live with CHF, the signs and symptoms of CHF, and how keep track of weight and sodium intake.   Heart Disease and Intimacy -Discus the effect sexual activity has on the heart, how changes occur during intimacy as we age, and safety during sexual activity.   Smoking Cessation / COPD -Discuss different methods to quit smoking, the health benefits of quitting smoking, and the definition of COPD.   Nutrition I: Fats -Discuss the types of cholesterol, what cholesterol does to the heart, and how cholesterol levels can be controlled.   Nutrition II: Labels -Discuss the different components of food labels and how to read food label   Heart Parts/Heart Disease and PAD -Discuss the anatomy of the heart, the pathway of blood circulation through the heart, and these are affected by heart disease.   Stress I: Signs and Symptoms -Discuss the causes of stress, how stress may lead to anxiety and depression, and ways to limit stress.   Stress II: Relaxation -Discuss different types of relaxation techniques to limit stress.   Warning Signs of Stroke / TIA -Discuss definition of a stroke, what the signs and symptoms are of a stroke, and how to identify when someone is  having stroke.   Knowledge Questionnaire Score:  Knowledge Questionnaire Score - 11/11/20 1447      Knowledge Questionnaire Score   Pre Score 20/24           Core Components/Risk Factors/Patient Goals at Admission:  Personal Goals and Risk Factors at Admission - 11/11/20 1447      Core Components/Risk Factors/Patient Goals on Admission    Weight Management Weight Maintenance    Diabetes Yes    Intervention Provide education about signs/symptoms and action to take for hypo/hyperglycemia.;Provide education about proper nutrition, including hydration, and aerobic/resistive exercise prescription along with prescribed medications to achieve blood glucose in normal ranges: Fasting glucose 65-99 mg/dL    Expected Outcomes Short Term: Participant verbalizes understanding of the signs/symptoms and immediate care of hyper/hypoglycemia, proper foot care and importance of medication, aerobic/resistive exercise and nutrition plan for blood glucose control.    Personal Goal Other Yes    Personal Goal Improve strength and energy; improve stamina.    Intervention Provide monitored exercise and education for risk modification.    Expected Outcomes Patient will complete the program and  meet both program and personal goals.           Core Components/Risk Factors/Patient Goals Review:    Core Components/Risk Factors/Patient Goals at Discharge (Final Review):    ITP Comments:   Comments: Patient arrived for 1st visit/orientation/education at 1230. Patient was referred to CR by Sherren Mocha due to S/P Stent placement (Z95.2) and TVAR (Z95.5). During orientation advised patient on arrival and appointment times what to wear, what to do before, during and after exercise. Reviewed attendance and class policy.  Pt is scheduled to return Cardiac Rehab on 11/15/20 at 2:45. Pt was advised to  come to class 15 minutes before class starts.  Discussed RPE/Dpysnea scales. Patient participated in warm up  stretches. Patient was able to complete 6 minute walk test.  Telemetry:NSR with some pauses. Patient was measured for the equipment. Discussed equipment safety with patient. Took patient pre-anthropometric measurements. Patient finished visit at 1415.

## 2020-11-11 NOTE — Progress Notes (Signed)
Cardiac/Pulmonary Rehab Medication Review by a Pharmacist  Does the patient  feel that his/her medications are working for him/her?  yes  Has the patient been experiencing any side effects to the medications prescribed?  no  Does the patient measure his/her own blood pressure or blood glucose at home?  yes - running WNL  Does the patient have any problems obtaining medications due to transportation or finances?   no  Understanding of regimen: good Understanding of indications: good Potential of compliance: good  Questions asked to Determine Patient Understanding of Medication Regimen:  1. What is the name of the medication?  2. What is the medication used for?  3. When should it be taken?  4. How much should be taken?  5. How will you take it?  6. What side effects should you report?  Understanding Defined as: Excellent: All questions above are correct Good: Questions 1-4 are correct Fair: Questions 1-2 are correct  Poor: 1 or none of the above questions are correct   Pharmacist comments: [name] with her and helps with medications.  Good understanding of medications and regimen.    Ramond Craver 11/11/2020 1:35 PM

## 2020-11-15 ENCOUNTER — Other Ambulatory Visit: Payer: Self-pay

## 2020-11-15 ENCOUNTER — Encounter (HOSPITAL_COMMUNITY)
Admission: RE | Admit: 2020-11-15 | Discharge: 2020-11-15 | Disposition: A | Discharge status: Home / Self Care | Payer: Medicare Other | Source: Ambulatory Visit | Attending: Cardiovascular Disease | Admitting: Cardiovascular Disease

## 2020-11-15 DIAGNOSIS — Z952 Presence of prosthetic heart valve: Secondary | ICD-10-CM

## 2020-11-15 DIAGNOSIS — Z955 Presence of coronary angioplasty implant and graft: Secondary | ICD-10-CM

## 2020-11-15 NOTE — Progress Notes (Signed)
Daily Session Note  Patient Details  Name: Denise Parrish MRN: [id number] Date of Birth: October 26, 1922 Referring Provider:   Flowsheet Row CARDIAC REHAB PHASE II ORIENTATION from 11/11/2020 in [place]  Referring Provider Dr. Johnsie Cancel      Encounter Date: 11/15/2020  Check In:  Session Check In - 11/15/20 1445      Check-In   Supervising physician immediately available to respond to emergencies [facility] MD immediately available    Physician(s) Dr. Harl Bowie    Location [facility]    Staff Present Cathren Harsh, MS, Exercise Physiologist;Debra Wynetta Emery, RN, BSN    Virtual Visit No    Medication changes reported     No    Fall or balance concerns reported    No    Tobacco Cessation No Change    Warm-up and Cool-down Performed as group-led instruction    Resistance Training Performed Yes    VAD Patient? No    PAD/SET Patient? No      Pain Assessment   Currently in Pain? No/denies    Pain Score 0-No pain    Multiple Pain Sites No           Capillary Blood Glucose: No results found for this or any previous visit (from the past 24 hour(s)).    Social History   Tobacco Use  Smoking Status Never Smoker  Smokeless Tobacco Never Used  Tobacco Comment   Quit x 50 years    Goals Met:  Independence with exercise equipment Exercise tolerated well No report of cardiac concerns or symptoms Strength training completed today  Goals Unmet:  Not Applicable  Comments: check out 1545   Dr. Kathie Dike is Medical Director for [facility] Pul[facility].

## 2020-11-17 ENCOUNTER — Other Ambulatory Visit: Payer: Self-pay

## 2020-11-17 ENCOUNTER — Encounter (HOSPITAL_COMMUNITY)
Admission: RE | Admit: 2020-11-17 | Discharge: 2020-11-17 | Disposition: A | Discharge status: Home / Self Care | Payer: Medicare Other | Source: Ambulatory Visit | Attending: Cardiovascular Disease | Admitting: Cardiovascular Disease

## 2020-11-17 DIAGNOSIS — Z952 Presence of prosthetic heart valve: Secondary | ICD-10-CM | POA: Diagnosis present

## 2020-11-17 DIAGNOSIS — Z955 Presence of coronary angioplasty implant and graft: Secondary | ICD-10-CM | POA: Diagnosis present

## 2020-11-17 NOTE — Progress Notes (Signed)
Daily Session Note  Patient Details  Name: ALAISA Parrish MRN: [id number] Date of Birth: [birth year] Referring Provider:   Flowsheet Row CARDIAC REHAB PHASE II ORIENTATION from 11/11/2020 in [place]  Referring Provider Dr. Johnsie Cancel      Encounter Date: 11/17/2020  Check In:  Session Check In - 11/17/20 1445      Check-In   Supervising physician immediately available to respond to emergencies [facility] MD immediately available    Physician(s) Dr. Harl Bowie    Location [facility]    Staff Present Aundra Dubin, RN, BSN;Phyllis Billingsley, RN;Madison Audria Nine, MS, Exercise Physiologist;Dalton Kris Mouton, MS, ACSM-CEP, Exercise Physiologist    Virtual Visit No    Medication changes reported     No    Fall or balance concerns reported    No    Tobacco Cessation No Change    Warm-up and Cool-down Performed as group-led instruction    Resistance Training Performed Yes    VAD Patient? No    PAD/SET Patient? No      Pain Assessment   Currently in Pain? No/denies    Pain Score 0-No pain    Multiple Pain Sites No           Capillary Blood Glucose: No results found for this or any previous visit (from the past 24 hour(s)).    Social History   Tobacco Use  Smoking Status Never Smoker  Smokeless Tobacco Never Used  Tobacco Comment   Quit x 50 years    Goals Met:  Independence with exercise equipment Exercise tolerated well No report of cardiac concerns or symptoms Strength training completed today  Goals Unmet:  Not Applicable  Comments: Check out 1645.   Dr. Kathie Dike is Medical Director for [facility] Pulmonary Re[facility].

## 2020-11-19 ENCOUNTER — Encounter (HOSPITAL_COMMUNITY): Payer: Medicare Other

## 2020-11-22 ENCOUNTER — Encounter (HOSPITAL_COMMUNITY)
Admission: RE | Admit: 2020-11-22 | Discharge: 2020-11-22 | Disposition: A | Discharge status: Home / Self Care | Payer: Medicare Other | Source: Ambulatory Visit | Attending: Cardiovascular Disease | Admitting: Cardiovascular Disease

## 2020-11-22 ENCOUNTER — Other Ambulatory Visit: Payer: Self-pay

## 2020-11-22 VITALS — Wt 169.8 lb

## 2020-11-22 DIAGNOSIS — Z955 Presence of coronary angioplasty implant and graft: Secondary | ICD-10-CM

## 2020-11-22 DIAGNOSIS — Z952 Presence of prosthetic heart valve: Secondary | ICD-10-CM

## 2020-11-22 NOTE — Progress Notes (Signed)
Daily Session Note  Patient Details  Name: Denise Parrish MRN: [id number] Date of Birth: [birth year] Referring Provider:   Flowsheet Row CARDIAC REHAB PHASE II ORIENTATION from 11/11/2020 in [place]  Referring Provider Dr. Johnsie Cancel      Encounter Date: 11/22/2020  Check In:  Session Check In - 11/22/20 1445      Check-In   Supervising physician immediately available to respond to emergencies [facility] MD immediately available    Physician(s) Dr. Harl Bowie    Location [facility]    Staff Present Aundra Dubin, RN, BSN;[place] Audria Nine, MS, Exercise Physiologist;[place] Kris Mouton, MS, ACSM-CEP, Exercise Physiologist    Virtual Visit No    Medication changes reported     No    Fall or balance concerns reported    No    Tobacco Cessation No Change    Warm-up and Cool-down Performed as group-led instruction    Resistance Training Performed Yes    VAD Patient? No    PAD/SET Patient? No      Pain Assessment   Currently in Pain? No/denies    Pain Score 0-No pain    Multiple Pain Sites No           Capillary Blood Glucose: No results found for this or any previous visit (from the past 24 hour(s)).    Social History   Tobacco Use  Smoking Status Never Smoker  Smokeless Tobacco Never Used  Tobacco Comment   Quit x 50 years    Goals Met:  Independence with exercise equipment Exercise tolerated well No report of cardiac concerns or symptoms Strength training completed today  Goals Unmet:  Not Applicable  Comments: Check out 1545.   Dr. Kathie Dike is Medical Director for [facility].

## 2020-11-24 ENCOUNTER — Encounter (HOSPITAL_COMMUNITY): Payer: Medicare Other

## 2020-11-24 NOTE — Progress Notes (Signed)
Cardiac Individual Treatment Plan  Patient Details  Name: Denise Parrish MRN: [id number] Date of Birth: June 07, 1923 Referring Provider:   Flowsheet Row CARDIAC REHAB PHASE II ORIENTATION from 11/11/2020 in [place]  Referring Provider Dr. Johnsie Cancel      Initial Encounter Date:  Flowsheet Row CARDIAC REHAB PHASE II ORIENTATION from 11/11/2020 in [place]  Date 11/11/20      Visit Diagnosis: S/P coronary artery stent placement  S/P TAVR (transcatheter aortic valve replacement)  Patient's Home Medications on Admission:  Current Outpatient Medications:  .  ALPRAZolam (XANAX) 0.5 MG tablet, Take 0.5 mg by mouth at bedtime as needed for anxiety., Disp: , Rfl:  .  Ascorbic Acid (VITAMIN C PO), Take 1 tablet by mouth daily., Disp: , Rfl:  .  atorvastatin (LIPITOR) 40 MG tablet, Take 1 tablet (40 mg total) by mouth daily at 6 PM., Disp: 30 tablet, Rfl: 2 .  cholecalciferol (VITAMIN D) 25 MCG (1000 UNIT) tablet, Take 1,000 Units by mouth daily., Disp: , Rfl:  .  clopidogrel (PLAVIX) 75 MG tablet, Take 1 tablet (75 mg total) by mouth daily with breakfast., Disp: 90 tablet, Rfl: 1 .  glimepiride (AMARYL) 1 MG tablet, Take 1 mg by mouth daily with breakfast., Disp: , Rfl:  .  Glycerin-Hypromellose-PEG 400 (VISINE DRY EYE OP), Apply 1 drop to eye 3 (three) times daily as needed (for dry eye relief). , Disp: , Rfl:  .  LUTEIN PO, Take 1 capsule by mouth daily., Disp: , Rfl:  .  meclizine (ANTIVERT) 12.5 MG tablet, Take 1 tablet (12.5 mg total) by mouth 3 (three) times daily as needed for dizziness., Disp: 20 tablet, Rfl: 0 .  metoprolol tartrate (LOPRESSOR) 25 MG tablet, Take 0.5 tablets (12.5 mg total) by mouth 2 (two) times daily., Disp: 30 tablet, Rfl: 0 .  Multiple Vitamins-Minerals (PRESERVISION AREDS 2 PO), Take 1 tablet by mouth 2 (two) times daily. Takes 1 tablet in the morning and 1 tablet at night for eyes., Disp: , Rfl:  .  pantoprazole  (PROTONIX) 40 MG tablet, Take 1 tablet (40 mg total) by mouth daily., Disp: 90 tablet, Rfl: 3  Past Medical History: Past Medical History:  Diagnosis Date  . Anxiety   . Carotid stenosis, right 10/16/2019   75% on CTA neck  . Cholelithiasis    on u/s 09/2011  . Degenerative joint disease    Right THA in 1995; cervical discectomy and fusion-2001  . Diabetes mellitus, type 2 (HCC)    + neuropathy  . Emphysema   . Gastroesophageal reflux disease   . Hiatal hernia   . History of CVA (cerebrovascular accident)   . Hyperlipidemia   . Hypertension   . IDA (iron deficiency anemia)    labs 09/2011  . Mild cognitive impairment   . Obesity   . Osteoporosis   . Pneumonia   . 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 Severe aortic stenosis   . Sigmoid diverticulosis   . Small bowel lesion    On Given's capsule; Dr GKathleene Hazel11/2013, no further w/u needed    Tobacco Use: Social History   Tobacco Use  Smoking Status Never Smoker  Smokeless Tobacco Never Used  Tobacco Comment   Quit x 50 years    Labs: Recent Review [employer], physiological   Labs for ITP Cardiac and Pulmonary Rehab Latest Ref  Rng & Units 09/08/2020 09/24/2020 09/28/2020 09/28/2020 09/28/2020   Cholestrol 0 - 200 mg/dL - - - - -   LDLCALC 0 - 99 mg/dL - - - - -   HDL >40 mg/dL - - - - -   Trlycerides <150 mg/dL - - - - -   Hemoglobin A1c 4.8 - 5.6 % - - - - -   PHART 7.350 - 7.450 7.355 7.392 - - -   PCO2ART 32.0 - 48.0 mmHg 47.0 41.0 - - -   HCO3 20.0 - 28.0 mmol/L 26.2 24.4 - - -   TCO2 22 - 32 mmol/L 28 - 25 26 24    ACIDBASEDEF 0.0 - 2.0 mmol/L - - - - -   O2SAT % 97.0 97.9 - - -      Capillary Blood Glucose: Lab Results  Component Value Date   GLUCAP 139 (H) 11/11/2020   GLUCAP 101 (H) 11/11/2020   GLUCAP 91 11/11/2020   GLUCAP 114 (H) 09/30/2020   GLUCAP 150 (H) 09/29/2020     Exercise Target Goals: Exercise  Program Goal: Individual exercise prescription set using results from initial 6 min walk test and THRR while considering  patient's activity barriers and safety.   Exercise Prescription Goal: Starting with aerobic activity 30 plus minutes a day, 3 days per week for initial exercise prescription. Provide home exercise prescription and guidelines that participant acknowledges understanding prior to discharge.  Activity Barriers & Risk Stratification:   6 Minute Walk:  6 Minute Walk    Row Name 11/11/20 1510         6 Minute Walk   Phase Initial     Distance 400 feet     Walk Time 6 minutes     # of Rest Breaks 3     MPH 0.8     METS 0.17     RPE 15     VO2 Peak 0.59     Symptoms Yes (comment)     Comments shortness of breath and leg fatigue     Resting HR 80 bpm     Resting BP 140/58     Resting Oxygen Saturation  99 %     Exercise Oxygen Saturation  during 6 min walk 98 %     Max Ex. HR 101 bpm     Max Ex. BP 164/52     2 Minute Post BP 138/54            Oxygen Initial Assessment:   Oxygen Re-Evaluation:   Oxygen Discharge (Final Oxygen Re-Evaluation):   Initial Exercise Prescription:  Initial Exercise Prescription - 11/11/20 1500      Date of Initial Exercise RX and Referring Provider   Date 11/11/20    Referring Provider Dr. Johnsie Cancel    Expected Discharge Date 02/02/21      NuStep   Level 1    SPM 60    Minutes 17      Arm Ergometer   Level 1    RPM 40    Minutes 22      Prescription Details   Frequency (times per week) 3      Intensity   THRR 40-80% of Max Heartrate 50-100    Ratings of Perceived Exertion 11-13      Progression   Progression Continue to progress workloads to maintain intensity without signs/symptoms of physical distress.      Resistance Training   Training Prescription Yes    Weight 1    Reps  10-15           Perform Capillary Blood Glucose checks as needed.  Exercise Prescription Changes:   Exercise Prescription  Changes    Row Name 11/22/20 1545             Response to Exercise   Blood Pressure (Admit) 120/66       Blood Pressure (Exercise) 122/46       Blood Pressure (Exit) 80/40       Heart Rate (Admit) 77 bpm       Heart Rate (Exercise) 103 bpm       Heart Rate (Exit) 86 bpm       Rating of Perceived Exertion (Exercise) 14       Duration Continue with 30 min of aerobic exercise without signs/symptoms of physical distress.       Intensity THRR unchanged               Progression   Progression Continue to progress workloads to maintain intensity without signs/symptoms of physical distress.               Resistance Training   Training Prescription Yes       Weight 1       Reps 10-15       Time 10 Minutes               NuStep   Level 1       SPM 53       Minutes 17       METs 1.7              Exercise Comments:   Exercise Comments    Row Name 11/15/20 1523           Exercise Comments Patient completed first exercise session today. She completed the strength training, and the first exercise station of aerobic exercise. She got very fatigued, lightheaded and a little dizzy. She discharged early today and was unable to complete the full session due to those symptoms.              Exercise Goals and Review:   Exercise Goals    Row Name 11/11/20 1514 11/22/20 1545           Exercise Goals   Increase Physical Activity Yes Yes      Intervention Provide advice, education, support and counseling about physical activity/exercise needs.;Develop an individualized exercise prescription for aerobic and resistive training based on initial evaluation findings, risk stratification, comorbidities and participant's personal goals. Provide advice, education, support and counseling about physical activity/exercise needs.;Develop an individualized exercise prescription for aerobic and resistive training based on initial evaluation findings, risk stratification, comorbidities and  participant's personal goals.      Expected Outcomes Short Term: Attend rehab on a regular basis to increase amount of physical activity.;Long Term: Add in home exercise to make exercise part of routine and to increase amount of physical activity.;Long Term: Exercising regularly at least 3-5 days a week. Short Term: Attend rehab on a regular basis to increase amount of physical activity.;Long Term: Add in home exercise to make exercise part of routine and to increase amount of physical activity.;Long Term: Exercising regularly at least 3-5 days a week.      Increase Strength and Stamina Yes Yes      Intervention Provide advice, education, support and counseling about physical activity/exercise needs.;Develop an individualized exercise prescription for aerobic and resistive training based on initial evaluation findings, risk stratification,  comorbidities and participant's personal goals. Provide advice, education, support and counseling about physical activity/exercise needs.;Develop an individualized exercise prescription for aerobic and resistive training based on initial evaluation findings, risk stratification, comorbidities and participant's personal goals.      Expected Outcomes Short Term: Increase workloads from initial exercise prescription for resistance, speed, and METs.;Short Term: Perform resistance training exercises routinely during rehab and add in resistance training at home;Long Term: Improve cardiorespiratory fitness, muscular endurance and strength as measured by increased METs and functional capacity (6MWT) Short Term: Increase workloads from initial exercise prescription for resistance, speed, and METs.;Short Term: Perform resistance training exercises routinely during rehab and add in resistance training at home;Long Term: Improve cardiorespiratory fitness, muscular endurance and strength as measured by increased METs and functional capacity (6MWT)      Able to understand and use rate of  perceived exertion (RPE) scale Yes Yes      Intervention Provide education and explanation on how to use RPE scale Provide education and explanation on how to use RPE scale      Expected Outcomes Short Term: Able to use RPE daily in rehab to express subjective intensity level;Long Term:  Able to use RPE to guide intensity level when exercising independently Short Term: Able to use RPE daily in rehab to express subjective intensity level;Long Term:  Able to use RPE to guide intensity level when exercising independently      Knowledge and understanding of Target Heart Rate Range (THRR) Yes Yes      Intervention Provide education and explanation of THRR including how the numbers were predicted and where they are located for reference Provide education and explanation of THRR including how the numbers were predicted and where they are located for reference      Expected Outcomes Short Term: Able to state/look up THRR;Long Term: Able to use THRR to govern intensity when exercising independently;Short Term: Able to use daily as guideline for intensity in rehab Short Term: Able to state/look up THRR;Long Term: Able to use THRR to govern intensity when exercising independently;Short Term: Able to use daily as guideline for intensity in rehab      Able to check pulse independently Yes Yes      Intervention Provide education and demonstration on how to check pulse in carotid and radial arteries.;Review the importance of being able to check your own pulse for safety during independent exercise Provide education and demonstration on how to check pulse in carotid and radial arteries.;Review the importance of being able to check your own pulse for safety during independent exercise      Expected Outcomes Short Term: Able to explain why pulse checking is important during independent exercise;Long Term: Able to check pulse independently and accurately Short Term: Able to explain why pulse checking is important during  independent exercise;Long Term: Able to check pulse independently and accurately      Understanding of Exercise Prescription Yes Yes      Intervention Provide education, explanation, and written materials on patient's individual exercise prescription Provide education, explanation, and written materials on patient's individual exercise prescription      Expected Outcomes Long Term: Able to explain home exercise prescription to exercise independently;Short Term: Able to explain program exercise prescription Long Term: Able to explain home exercise prescription to exercise independently;Short Term: Able to explain program exercise prescription             Exercise Goals Re-Evaluation :  Exercise Goals Re-Evaluation    [place] Name 11/22/20 1545  Exercise Goal Re-Evaluation   Exercise Goals Review Increase Physical Activity;Increase Strength and Stamina;Able to understand and use rate of perceived exertion (RPE) scale;Knowledge and understanding of Target Heart Rate Range (THRR);Able to check pulse independently;Understanding of Exercise Prescription       Comments Patient has completed 3 exercise sessions. She can only complete the first exercise station before she has to stop. She gets tired pretty quickly and after the first station she stops and is done for the day. She does what she can and then needs to stop. She is currently exercising at 1.7 METs on the NuStep. WIll continue to monitor and progress as able.       Expected Outcomes Throughout exercise at home and at rehab patient will achieve their goals.               Discharge Exercise Prescription (Final Exercise Prescription Changes):  Exercise Prescription Changes - 11/22/20 1545      Response to Exercise   Blood Pressure (Admit) 120/66    Blood Pressure (Exercise) 122/46    Blood Pressure (Exit) 80/40    Heart Rate (Admit) 77 bpm    Heart Rate (Exercise) 103 bpm    Heart Rate (Exit) 86 bpm    Rating of Perceived  Exertion (Exercise) 14    Duration Continue with 30 min of aerobic exercise without signs/symptoms of physical distress.    Intensity THRR unchanged      Progression   Progression Continue to progress workloads to maintain intensity without signs/symptoms of physical distress.      Resistance Training   Training Prescription Yes    Weight 1    Reps 10-15    Time 10 Minutes      NuStep   Level 1    SPM 53    Minutes 17    METs 1.7           Nutrition:  Target Goals: Understanding of nutrition guidelines, daily intake of sodium <1566m, cholesterol <2066m calories 30% from fat and 7% or less from saturated fats, daily to have 5 or more servings of fruits and vegetables.  Biometrics:  Pre Biometrics - 11/22/20 1545      Pre Biometrics   Weight 77 kg    BMI (Calculated) 28.25            Nutrition Therapy Plan and Nutrition Goals:  Nutrition Therapy & Goals - 11/17/20 1023      Personal Nutrition Goals   Comments We will continue to provide heart healthy nutritional education through handouts.      Intervention Plan   Intervention Nutrition handout(s) given to patient.           Nutrition Assessments:  Nutrition Assessments - 11/11/20 1446      MEDFICTS Scores   Pre Score 23          MEDIFICTS Score Key:  ?70 Need to make dietary changes   40-70 Heart Healthy Diet  ? 40 Therapeutic Level Cholesterol Diet   Picture Your Plate Scores:  <4<71nhealthy dietary pattern with much room for improvement.  41-50 Dietary pattern unlikely to meet recommendations for good health and room for improvement.  51-60 More healthful dietary pattern, with some room for improvement.   >60 Healthy dietary pattern, although there may be some specific behaviors that could be improved.    Nutrition Goals Re-Evaluation:   Nutrition Goals Discharge (Final Nutrition Goals Re-Evaluation):   Psychosocial: Target Goals: Acknowledge presence or absence of significant  depression and/or stress, maximize coping skills, provide positive support system. Participant is able to verbalize types and ability to use techniques and skills needed for reducing stress and depression.  Initial Review & Psychosocial Screening:  Initial Psych Review & Screening - 11/11/20 1450      Initial Review   Current issues with Current Anxiety/Panic      Family Dynamics   Good Support System? Yes    Comments Patient lives alone. She has 2 daughters that share responsibility to check on her everyday and provide her meals. She has several grandchildren that she stays in touch with and a lot of friends. She is still able to be independent with her ADL's including her medications. She has a very positive outlook and is looking forward to the program. She does have anxiety which she says has worsened over the past few years. She does take Alprazolam 0.5 mg at bedtime to help her sleep but says she is going to talk to her PCP about getting on something during the day as needed for her "nervous feelings'.      Barriers   Psychosocial barriers to participate in program There are no identifiable barriers or psychosocial needs.;The patient should benefit from training in stress management and relaxation.      Screening Interventions   Expected Outcomes Short Term goal: Identification and review with participant of any Quality of Life or Depression concerns found by scoring the questionnaire.           Quality of Life Scores:  Quality of Life - 11/11/20 1516      Quality of Life   Select Quality of Life      Quality of Life Scores   Health/Function Pre 22.18 %    Socioeconomic Pre 27.25 %    Psych/Spiritual Pre 24.21 %    Family Pre 25.5 %    GLOBAL Pre 24.05 %          Scores of 19 and below usually indicate a poorer quality of life in these areas.  A difference of  2-3 points is a clinically meaningful difference.  A difference of 2-3 points in the total score of the Quality of  Life Index has been associated with significant improvement in overall quality of life, self-image, physical symptoms, and general health in studies assessing change in quality of life.  PHQ-9: Recent Review Flowsheet Data    Depression screen [facility] 2/9 11/11/2020   Decreased Interest 0   Down, Depressed, Hopeless 0   PHQ - 2 Score 0   Altered sleeping 3    Tired, decreased energy 2   Change in appetite 0   Feeling bad or failure about yourself  0   Trouble concentrating 0   Moving slowly or fidgety/restless 0   Suicidal thoughts 0   PHQ-9 Score 5   Difficult doing work/chores Somewhat difficult     Interpretation of Total Score  Total Score Depression Severity:  1-4 = Minimal depression, 5-9 = Mild depression, 10-14 = Moderate depression, 15-19 = Moderately severe depression, 20-27 = Severe depression   Psychosocial Evaluation and Intervention:  Psychosocial Evaluation - 11/11/20 1454      Psychosocial Evaluation & Interventions   Interventions Stress management education;Relaxation education;Encouraged to exercise with the program and follow exercise prescription    Comments Patient's initial QOL score was 24.05 overall and her PHQ-9 score was 5. She does have anxiety which is managed at night with Alprazolam. She plans to speak with her PCP  about prescribing something during the day as needed when she feels anxious. Will continue to monitor.    Expected Outcomes Patient's anxiety will be managed at discharge with no additional psychosocial issued identified.    Continue Psychosocial Services  No Follow up required           Psychosocial Re-Evaluation:  Psychosocial Re-Evaluation    [place] Name 11/17/20 1023             Psychosocial Re-Evaluation   Current issues with Current Anxiety/Panic       Comments Patient is new to the program completing 1 session. She continues to take Alprazolam at bedtime. She demonstrates a positive attitude and hopes to be able to complete the  program. Will continue to monitor.       Expected Outcomes Patient's anxiety will be managed at discharge with no additional psychosocial issues identfied.       Interventions Stress management education;Encouraged to attend Cardiac Rehabilitation for the exercise;Relaxation education       Continue Psychosocial Services  No Follow up required              Psychosocial Discharge (Final Psychosocial Re-Evaluation):  Psychosocial Re-Evaluation - 11/17/20 1023      Psychosocial Re-Evaluation   Current issues with Current Anxiety/Panic    Comments Patient is new to the program completing 1 session. She continues to take Alprazolam at bedtime. She demonstrates a positive attitude and hopes to be able to complete the program. Will continue to monitor.    Expected Outcomes Patient's anxiety will be managed at discharge with no additional psychosocial issues identfied.    Interventions Stress management education;Encouraged to attend Cardiac Rehabilitation for the exercise;Relaxation education    Continue Psychosocial Services  No Follow up required           Vocational Rehabilitation: Provide vocational rehab assistance to qualifying candidates.   Vocational Rehab Evaluation & Intervention:  Vocational Rehab - 11/11/20 1447      Initial Vocational Rehab Evaluation & Intervention   Assessment shows need for Vocational Rehabilitation No      Vocational Rehab Re-Evaulation   Comments Patient is retired and is not interested in returning to work and does not need vocational rehab.           Education: Education Goals: Education classes will be provided on a weekly basis, covering required topics. Participant will state understanding/return demonstration of topics presented.  Learning Barriers/Preferences:  Learning Barriers/Preferences - 11/11/20 1446      Learning Barriers/Preferences   Learning Barriers Sight    Learning Preferences Audio           Education  Topics: Hypertension, Hypertension Reduction -Define heart disease and high blood pressure. Discus how high blood pressure affects the body and ways to reduce high blood pressure.   Exercise and Your Heart -Discuss why it is important to exercise, the FITT principles of exercise, normal and abnormal responses to exercise, and how to exercise safely.   Angina -Discuss definition of angina, causes of angina, treatment of angina, and how to decrease risk of having angina.   Cardiac Medications -Review what the following cardiac medications are used for, how they affect the body, and side effects that may occur when taking the medications.  Medications include Aspirin, Beta blockers, calcium channel blockers, ACE Inhibitors, angiotensin receptor blockers, diuretics, digoxin, and antihyperlipidemics.   Congestive Heart Failure -Discuss the definition of CHF, how to live with CHF, the signs and symptoms of CHF, and how  keep track of weight and sodium intake.   Heart Disease and Intimacy -Discus the effect sexual activity has on the heart, how changes occur during intimacy as we age, and safety during sexual activity.   Smoking Cessation / COPD -Discuss different methods to quit smoking, the health benefits of quitting smoking, and the definition of COPD.   Nutrition I: Fats -Discuss the types of cholesterol, what cholesterol does to the heart, and how cholesterol levels can be controlled.   Nutrition II: Labels -Discuss the different components of food labels and how to read food label Flowsheet Row CARDIAC REHAB PHASE II EXERCISE from 11/17/2020 in [place]  Date 11/17/20  Educator DJ  Instruction Review Code 1- Verbalizes Understanding      Heart Parts/Heart Disease and PAD -Discuss the anatomy of the heart, the pathway of blood circulation through the heart, and these are affected by heart disease.   Stress I: Signs and Symptoms -Discuss the causes  of stress, how stress may lead to anxiety and depression, and ways to limit stress.   Stress II: Relaxation -Discuss different types of relaxation techniques to limit stress.   Warning Signs of Stroke / TIA -Discuss definition of a stroke, what the signs and symptoms are of a stroke, and how to identify when someone is having stroke.   Knowledge Questionnaire Score:  Knowledge Questionnaire Score - 11/11/20 1447      Knowledge Questionnaire Score   Pre Score 20/24           Core Components/Risk Factors/Patient Goals at Admission:  Personal Goals and Risk Factors at Admission - 11/11/20 1447      Core Components/Risk Factors/Patient Goals on Admission    Weight Management Weight Maintenance    Diabetes Yes    Intervention Provide education about signs/symptoms and action to take for hypo/hyperglycemia.;Provide education about proper nutrition, including hydration, and aerobic/resistive exercise prescription along with prescribed medications to achieve blood glucose in normal ranges: Fasting glucose 65-99 mg/dL    Expected Outcomes Short Term: Participant verbalizes understanding of the signs/symptoms and immediate care of hyper/hypoglycemia, proper foot care and importance of medication, aerobic/resistive exercise and nutrition plan for blood glucose control.    Personal Goal Other Yes    Personal Goal Improve strength and energy; improve stamina.    Intervention Provide monitored exercise and education for risk modification.    Expected Outcomes Patient will complete the program and  meet both program and personal goals.           Core Components/Risk Factors/Patient Goals Review:   Goals and Risk Factor Review    Row Name 11/17/20 1025             Core Components/Risk Factors/Patient Goals Review   Personal Goals Review Diabetes;Other       Review Patient is new to the program completing 1 session. She was referred for Stent and TAVR. She has multiple risk factors for  CAD and is participating in the program for risk modification. He most recent A1C was 7% 04/07/20. Her personal goals for the program are to improve her strenght and stamina. She became lightheaded during her first sessions and her systolic b/p dropped >56 mm hg. Her lightheadness subsided after she rested. We will continue to monitor as she works toward meeting her personal goals.       Expected Outcomes Patient will complete the program meeting both program and personal goals.  Core Components/Risk Factors/Patient Goals at Discharge (Final Review):   Goals and Risk Factor Review - 11/17/20 1025      Core Components/Risk Factors/Patient Goals Review   Personal Goals Review Diabetes;Other    Review Patient is new to the program completing 1 session. She was referred for Stent and TAVR. She has multiple risk factors for CAD and is participating in the program for risk modification. He most recent A1C was 7% 04/07/20. Her personal goals for the program are to improve her strenght and stamina. She became lightheaded during her first sessions and her systolic b/p dropped >50 mm hg. Her lightheadness subsided after she rested. We will continue to monitor as she works toward meeting her personal goals.    Expected Outcomes Patient will complete the program meeting both program and personal goals.           ITP Comments:   Comments: ITP REVIEW Pt is making expected progress toward Cardiac Rehab goals after completing 4 sessions. Recommend continued exercise, life style modification, education, and increased stamina and strength.

## 2020-11-25 ENCOUNTER — Telehealth: Payer: Self-pay | Admitting: Cardiovascular Disease

## 2020-11-25 DIAGNOSIS — Z79899 Other long term (current) drug therapy: Secondary | ICD-10-CM

## 2020-11-25 NOTE — Telephone Encounter (Signed)
Spoke to patients daughter who has concerns of patient being on Plavix. Patients daughter stated that patient is bleeding rectally and this has happened to the patient before where she had to be hospitalized and have a blood transfusion.  Please advise.

## 2020-11-25 NOTE — Telephone Encounter (Signed)
Patients daughter verbalized understanding. Will bring patient in for lab work when she comes in for PT.

## 2020-11-25 NOTE — Telephone Encounter (Signed)
Patient of Dr. Johnsie Cancel.  I reviewed the chart and recent note.  Difficult situation since she is not only status post recent DES intervention in December 2021, also subsequent TAVR.  She is on monotherapy with Plavix and it would be premature to discontinue at this time from a cardiac perspective (increased risk of stent thrombosis and valve thrombosis).  Please get a CBC in follow-up from her last hemoglobin in January with results to Dr. Johnsie Cancel.

## 2020-11-25 NOTE — Telephone Encounter (Signed)
New Message     Pt c/o medication issue:  1. Name of Medication: clopidogrel (PLAVIX) 75 MG tablet  2. How are you currently taking this medication (dosage and times per day)? 1 time daily   3. Are you having a reaction (difficulty breathing--STAT)? yes  4. What is your medication issue? Patient having blood in stool, just had valve replacement in January please advise what to do

## 2020-11-26 ENCOUNTER — Encounter (HOSPITAL_COMMUNITY): Payer: Self-pay | Admitting: *Deleted

## 2020-11-26 ENCOUNTER — Other Ambulatory Visit: Payer: Self-pay

## 2020-11-26 ENCOUNTER — Inpatient Hospital Stay (HOSPITAL_COMMUNITY)
Admission: EM | Admit: 2020-11-26 | Discharge: 2020-11-30 | DRG: 378 | Disposition: A | Discharge status: Home / Self Care | Payer: Medicare Other | Attending: Internal Medicine | Admitting: Internal Medicine

## 2020-11-26 ENCOUNTER — Encounter (HOSPITAL_COMMUNITY): Payer: Medicare Other

## 2020-11-26 ENCOUNTER — Emergency Department (HOSPITAL_COMMUNITY): Payer: Medicare Other

## 2020-11-26 DIAGNOSIS — I6521 Occlusion and stenosis of right carotid artery: Secondary | ICD-10-CM | POA: Diagnosis present

## 2020-11-26 DIAGNOSIS — K5730 Diverticulosis of large intestine without perforation or abscess without bleeding: Secondary | ICD-10-CM | POA: Diagnosis present

## 2020-11-26 DIAGNOSIS — F419 Anxiety disorder, unspecified: Secondary | ICD-10-CM | POA: Diagnosis present

## 2020-11-26 DIAGNOSIS — D508 Other iron deficiency anemias: Secondary | ICD-10-CM

## 2020-11-26 DIAGNOSIS — D62 Acute posthemorrhagic anemia: Secondary | ICD-10-CM | POA: Diagnosis present

## 2020-11-26 DIAGNOSIS — I130 Hypertensive heart and chronic kidney disease with heart failure and stage 1 through stage 4 chronic kidney disease, or unspecified chronic kidney disease: Secondary | ICD-10-CM | POA: Diagnosis present

## 2020-11-26 DIAGNOSIS — Z955 Presence of coronary angioplasty implant and graft: Secondary | ICD-10-CM

## 2020-11-26 DIAGNOSIS — Z79899 Other long term (current) drug therapy: Secondary | ICD-10-CM

## 2020-11-26 DIAGNOSIS — K219 Gastro-esophageal reflux disease without esophagitis: Secondary | ICD-10-CM | POA: Diagnosis present

## 2020-11-26 DIAGNOSIS — I10 Essential (primary) hypertension: Secondary | ICD-10-CM

## 2020-11-26 DIAGNOSIS — J439 Emphysema, unspecified: Secondary | ICD-10-CM | POA: Diagnosis present

## 2020-11-26 DIAGNOSIS — Z7984 Long term (current) use of oral hypoglycemic drugs: Secondary | ICD-10-CM

## 2020-11-26 DIAGNOSIS — D509 Iron deficiency anemia, unspecified: Secondary | ICD-10-CM | POA: Diagnosis present

## 2020-11-26 DIAGNOSIS — K6381 Dieulafoy lesion of intestine: Secondary | ICD-10-CM | POA: Diagnosis present

## 2020-11-26 DIAGNOSIS — Z8673 Personal history of transient ischemic attack (TIA), and cerebral infarction without residual deficits: Secondary | ICD-10-CM

## 2020-11-26 DIAGNOSIS — M1990 Unspecified osteoarthritis, unspecified site: Secondary | ICD-10-CM | POA: Diagnosis present

## 2020-11-26 DIAGNOSIS — K2270 Barrett's esophagus without dysplasia: Secondary | ICD-10-CM | POA: Diagnosis present

## 2020-11-26 DIAGNOSIS — K921 Melena: Principal | ICD-10-CM

## 2020-11-26 DIAGNOSIS — Z20822 Contact with and (suspected) exposure to covid-19: Secondary | ICD-10-CM | POA: Diagnosis present

## 2020-11-26 DIAGNOSIS — E1122 Type 2 diabetes mellitus with diabetic chronic kidney disease: Secondary | ICD-10-CM | POA: Diagnosis present

## 2020-11-26 DIAGNOSIS — K922 Gastrointestinal hemorrhage, unspecified: Secondary | ICD-10-CM

## 2020-11-26 DIAGNOSIS — Z8049 Family history of malignant neoplasm of other genital organs: Secondary | ICD-10-CM

## 2020-11-26 DIAGNOSIS — Z961 Presence of intraocular lens: Secondary | ICD-10-CM | POA: Diagnosis present

## 2020-11-26 DIAGNOSIS — E1140 Type 2 diabetes mellitus with diabetic neuropathy, unspecified: Secondary | ICD-10-CM | POA: Diagnosis present

## 2020-11-26 DIAGNOSIS — E785 Hyperlipidemia, unspecified: Secondary | ICD-10-CM | POA: Diagnosis present

## 2020-11-26 DIAGNOSIS — Z953 Presence of xenogenic heart valve: Secondary | ICD-10-CM

## 2020-11-26 DIAGNOSIS — G3184 Mild cognitive impairment, so stated: Secondary | ICD-10-CM | POA: Diagnosis present

## 2020-11-26 DIAGNOSIS — Z833 Family history of diabetes mellitus: Secondary | ICD-10-CM

## 2020-11-26 DIAGNOSIS — I2510 Atherosclerotic heart disease of native coronary artery without angina pectoris: Secondary | ICD-10-CM | POA: Diagnosis present

## 2020-11-26 DIAGNOSIS — Z9842 Cataract extraction status, left eye: Secondary | ICD-10-CM

## 2020-11-26 DIAGNOSIS — Z952 Presence of prosthetic heart valve: Secondary | ICD-10-CM

## 2020-11-26 DIAGNOSIS — N1832 Chronic kidney disease, stage 3b: Secondary | ICD-10-CM | POA: Diagnosis present

## 2020-11-26 DIAGNOSIS — I5032 Chronic diastolic (congestive) heart failure: Secondary | ICD-10-CM | POA: Diagnosis present

## 2020-11-26 DIAGNOSIS — Z7902 Long term (current) use of antithrombotics/antiplatelets: Secondary | ICD-10-CM

## 2020-11-26 DIAGNOSIS — Z8249 Family history of ischemic heart disease and other diseases of the circulatory system: Secondary | ICD-10-CM

## 2020-11-26 DIAGNOSIS — K635 Polyp of colon: Secondary | ICD-10-CM | POA: Diagnosis present

## 2020-11-26 LAB — CBC WITH DIFFERENTIAL/PLATELET
Abs Immature Granulocytes: 0.02 10*3/uL (ref 0.00–0.07)
Basophils Absolute: 0 10*3/uL (ref 0.0–0.1)
Basophils Relative: 0 %
Eosinophils Absolute: 0.1 10*3/uL (ref 0.0–0.5)
Eosinophils Relative: 1 %
HCT: 25.6 % — ABNORMAL LOW (ref 36.0–46.0)
Hemoglobin: 7.9 g/dL — ABNORMAL LOW (ref 12.0–15.0)
Immature Granulocytes: 0 %
Lymphocytes Relative: 26 %
Lymphs Abs: 1.6 10*3/uL (ref 0.7–4.0)
MCH: 26.9 pg (ref 26.0–34.0)
MCHC: 30.9 g/dL (ref 30.0–36.0)
MCV: 87.1 fL (ref 80.0–100.0)
Monocytes Absolute: 0.5 10*3/uL (ref 0.1–1.0)
Monocytes Relative: 9 %
Neutro Abs: 4 10*3/uL (ref 1.7–7.7)
Neutrophils Relative %: 64 %
Platelets: 229 10*3/uL (ref 150–400)
RBC: 2.94 MIL/uL — ABNORMAL LOW (ref 3.87–5.11)
RDW: 15 % (ref 11.5–15.5)
WBC: 6.3 10*3/uL (ref 4.0–10.5)
nRBC: 0 % (ref 0.0–0.2)

## 2020-11-26 LAB — BASIC METABOLIC PANEL
Anion gap: 12 (ref 5–15)
BUN: 22 mg/dL (ref 8–23)
CO2: 22 mmol/L (ref 22–32)
Calcium: 8.8 mg/dL — ABNORMAL LOW (ref 8.9–10.3)
Chloride: 103 mmol/L (ref 98–111)
Creatinine, Ser: 1.15 mg/dL — ABNORMAL HIGH (ref 0.44–1.00)
GFR, Estimated: 44 mL/min — ABNORMAL LOW (ref >60)
Glucose, Bld: 209 mg/dL — ABNORMAL HIGH (ref 70–99)
Potassium: 3.7 mmol/L (ref 3.5–5.1)
Sodium: 137 mmol/L (ref 135–145)

## 2020-11-26 LAB — HEPATIC FUNCTION PANEL
ALT: 28 U/L (ref 0–44)
AST: 35 U/L (ref 15–41)
Albumin: 3.4 g/dL — ABNORMAL LOW (ref 3.5–5.0)
Alkaline Phosphatase: 65 U/L (ref 38–126)
Bilirubin, Direct: <0.1 mg/dL (ref 0.0–0.2)
Total Bilirubin: 0.9 mg/dL (ref 0.3–1.2)
Total Protein: 6.3 g/dL — ABNORMAL LOW (ref 6.5–8.1)

## 2020-11-26 LAB — PREPARE RBC (CROSSMATCH)

## 2020-11-26 LAB — RESP PANEL BY RT-PCR (FLU A&B, COVID) ARPGX2
Influenza A by PCR: NEGATIVE
Influenza B by PCR: NEGATIVE
SARS Coronavirus 2 by RT PCR: NEGATIVE

## 2020-11-26 LAB — POC OCCULT BLOOD, ED: Fecal Occult Bld: POSITIVE — AB

## 2020-11-26 MED ORDER — SODIUM CHLORIDE 0.9 % IV SOLN: INTRAVENOUS | Status: DC

## 2020-11-26 MED ORDER — LORAZEPAM 2 MG/ML IJ SOLN
0.5000 mg | Freq: Once | INTRAMUSCULAR | Status: AC
  Administered 2020-11-26: 0.5 mg via INTRAVENOUS
  Filled 2020-11-26: qty 1

## 2020-11-26 MED ORDER — PEG 3350-KCL-NA BICARB-NACL 420 G PO SOLR: 4000.0000 mL | Freq: Once | ORAL | Status: DC

## 2020-11-26 MED ORDER — SODIUM CHLORIDE 0.9 % IV SOLN: 10.0000 mL/h | Freq: Once | INTRAVENOUS | Status: DC

## 2020-11-26 MED ORDER — PEG 3350-KCL-NA BICARB-NACL 420 G PO SOLR
4000.0000 mL | Freq: Once | ORAL | Status: AC
  Administered 2020-11-26: 4000 mL via ORAL

## 2020-11-26 MED ORDER — PANTOPRAZOLE SODIUM 40 MG IV SOLR
40.0000 mg | Freq: Two times a day (BID) | INTRAVENOUS | Status: DC
  Administered 2020-11-26 – 2020-11-27 (×2): 40 mg via INTRAVENOUS
  Filled 2020-11-26 (×2): qty 40

## 2020-11-26 NOTE — ED Triage Notes (Signed)
Pt on blood thinners due to heart value surgery, pt with dark black stools for past 3 days. VSS for [facility]

## 2020-11-26 NOTE — H&P (Signed)
History and Physical  Denise Parrish TWS:[id number] DOB: 06/23/1923 DOA: 11/26/2020   PCP: [name], MD   Patient coming from: Home  Chief Complaint: hematochezia, melena  HPI:  Denise Parrish is a 87 y.o. female with medical history of aortic stenosis status post TAVR 09/28/2020, coronary artery disease status post DES to left circumflex 09/08/2020, diabetes mellitus type 2, hypertension, hyperlipidemia, iron deficiency anemia, stroke, GI bleed, mild cognitive impairment, and diastolic CHF presenting with 3-day history of maroon stools progressing to melanotic stools.  The patient denies any fevers, chills, chest pain, shortness breath, dizziness, nausea, vomiting, diarrhea, abdominal pain, dysuria, hematuria.  There is no hematemesis.   Notably, the patient was started on Plavix monotherapy after her TAVR on 09/28/2020.  She has continued on Plavix uninterrupted since that time.  Her postoperative course was complicated by dissection of her right femoral artery.  This was repaired by vascular surgery.  She has not had any problems until recently.  However, the patient had a complicated hospital admission from 04/26/2020 to 05/07/2020 for acute blood loss anemia where she had an extensive work-up.  During that hospitalization, the patient underwent EGD which showed no active bleeding and one gastric polyp which was removed.  She underwent a capsule endoscopy which was a limited study as the capsule remained in the stomach for more than 11 hours.  It did not show any active bleeding.  A tagged red blood cell scan on 04/29/2020 was negative for any active bleed.  She was transfused 2 units PRBC at that time. In the emergency department, the patient was afebrile and hemodynamically stable.  BMP showed a sodium 137, potassium 3.7, serum creatinine 1.5.  LFTs were unremarkable.  WBC 6.3, hemoglobin 7.9, platelets 229,000.  Chest x-ray was negative for any acute findings.  EKG shows sinus  rhythm with nonspecific ST-T wave changes.  Assessment/Plan: Acute blood loss anemia -GI consulted -1 unit PRBC ordered -Baseline hemoglobin~9-10 -Presented with hemoglobin 7.9 -Planning for EGD and colonoscopy 11/27/2020 -Holding Plavix--last dose on morning through 1122 -Start Protonix IV -Clear liquid diet for now  CKD stage IIIb -Baseline creatinine 0.9-1.2 -A.m. BMP  Status post TAVR -Patient has continued on Plavix uninterrupted since TAVR on 09/28/2020 -Hold Plavix temporarily  Coronary artery disease -Status post DES to the left circumflex 09/08/2020 -Holding Plavix temporarily  Diabetes mellitus type 2, controlled -04/07/2020 hemoglobin A1c 7.0 -Holding Amaryl  Essential hypertension -Holding metoprolol secondary to soft blood pressure  Hyperlipidemia -Continue statin  Chronic diastolic CHF -Clinically compensated -10/25/2020 echo EF 70-25%, grade 1 DD, mildly elevated PASP, mild MS  Unspecified CVA in January 2021 and TIA in July 2021 -Recent right CEA on 2-21 for symptomatic right carotid artery stenosis -Holding Plavix temporarily  Anxiety disorder -Continue home dose of alprazolam        Past Medical History:  Diagnosis Date  . Anxiety   . Carotid stenosis, right 10/16/2019   75% on CTA neck  . Cholelithiasis    on u/s 09/2011  . Degenerative joint disease    Right THA in 1995; cervical discectomy and fusion-2001  . Diabetes mellitus, type 2 (HCC)    + neuropathy  . Emphysema   . Gastroesophageal reflux disease   . Hiatal hernia   . History of CVA (cerebrovascular accident)   . Hyperlipidemia   . Hypertension   . IDA (iron deficiency anemia)    labs 09/2011  . Mild cognitive impairment   . Obesity   .  Osteoporosis   . Pneumonia   . 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   s/p TAVR with a 59m Medtronic Evolut Pro+ via the TF approach   . Severe aortic stenosis   . Sigmoid diverticulosis   . Small bowel lesion     On Given's capsule; Dr GKathleene Hazel11/2013, no further w/u needed   Past Surgical History:  Procedure Laterality Date  . ANTERIOR CERVICAL DISCECTOMY  2001   C4-5, allograft, fixation  . BREAST LUMPECTOMY     benign  . CATARACT EXTRACTION W/ INTRAOCULAR LENS IMPLANT  2007   Left  . COLONOSCOPY  2006  . COLONOSCOPY  04/03/12   Dr. ROk Edwardsdiverticulosis, negative microscopic  colitis   . CORONARY STENT INTERVENTION N/A 09/08/2020   Procedure: CORONARY STENT INTERVENTION;  Surgeon: CSherren Mocha MD;  [facility];  Service: Cardiovascular;  Laterality: N/A;  . ENDARTERECTOMY Right 10/21/2019   Procedure: ENDARTERECTOMY CAROTID RIGHT;  Surgeon: CWaynetta Sandy MD;  [place]  Service: Vascular;  Laterality: Right;  . ESOPHAGOGASTRODUODENOSCOPY  04/03/12   Dr. RJennet Madurohernia, chronic gastritis on bx  . ESOPHAGOGASTRODUODENOSCOPY (EGD) WITH PROPOFOL N/A 04/28/2020   Procedure: ESOPHAGOGASTRODUODENOSCOPY (EGD) WITH PROPOFOL;  Surgeon: BOtis Brace MD;  [place] [facility]  Service: Gastroenterology;  Laterality: N/A;  . FEMORAL ARTERY EXPLORATION Right 09/28/2020   Procedure: RIGHT GROIN EXPLORATION WITH REPAIR RIGHT COMMON FEMORAL ARTERY;  Surgeon: ERosetta Posner MD;  [place]  Service: Vascular;  Laterality: Right;  . GIVENS CAPSULE STUDY  05/09/2012   RMR: an unclear raised area of small bowel was noted, with features almost  characteristic of very small polyp. This was without villous  blunting or any evidence of active bleeding; yet, the area of  concern appeared to be erythematous. However, this could simply  be a light reflection on a normal variation of the small bowel. REFERRED TO DR. GILLIAM, APPT FOR NOVEMBER 2013.   .Marland KitchenGIVENS CAPSULE STUDY N/A 04/30/2020   Procedure: GIVENS CAPSULE STUDY;  Surgeon: BOtis Brace MD;  [place] [facility]  Service: Gastroenterology;  Laterality: N/A;  . HIP ARTHROPLASTY Right   .  PATCH ANGIOPLASTY Right 10/21/2019   Procedure: PATCH ANGIOPLASTY USING XRueben BashBIOLOGIC PATCH;  Surgeon: CWaynetta Sandy MD;  [place] [facility] [place]  Service: Vascular;  Laterality: Right;  . POLYPECTOMY  04/28/2020   Procedure: POLYPECTOMY;  Surgeon: BOtis Brace MD;  [facility];  Service: Gastroenterology;;  . RIGHT/LEFT HEART CATH AND CORONARY ANGIOGRAPHY N/A 09/08/2020   Procedure: RIGHT/LEFT HEART CATH AND CORONARY ANGIOGRAPHY;  Surgeon: CSherren Mocha MD;  [facility];  Service: Cardiovascular;  Laterality: N/A;  . TEE WITHOUT CARDIOVERSION N/A 09/28/2020   Procedure: TRANSESOPHAGEAL ECHOCARDIOGRAM (TEE);  Surgeon: CSherren Mocha MD;  [place] [facility]  Service: Open Heart Surgery;  Laterality: N/A;  . TONSILLECTOMY    . TOTAL HIP ARTHROPLASTY  1995   Right  . TRANSCATHETER AORTIC VALVE REPLACEMENT, TRANSFEMORAL N/A 09/28/2020   Procedure: TRANSCATHETER AORTIC VALVE REPLACEMENT, TRANSFEMORAL;  Surgeon: CSherren Mocha MD;  [place]  Service: Open Heart Surgery;  Laterality: N/A;   Social History:  reports that she has never smoked. She has never used smokeless tobacco. She reports that she does not drink alcohol and does not use drugs.   Family History  Problem Relation Age of Onset  . Cirrhosis Mother        no etoh use  . CAD Father   . Diabetes  Mother        + brother  . Heart failure Father   . Hypertension Father        + brother  . Cancer Brother        lung, age 23  . Uterine cancer Sister   . Colon cancer Neg Hx      No Known Allergies   Prior to Admission medications   Medication Sig Start Date End Date Taking? Authorizing Provider  ALPRAZolam Duanne Moron) 0.5 MG tablet Take 0.5 mg by mouth at bedtime as needed for anxiety.   Yes [provider]  Ascorbic Acid (VITAMIN C PO) Take 1 tablet by mouth daily.   Yes [provider]  atorvastatin (LIPITOR) 40 MG tablet Take 1 tablet (40 mg total) by mouth  daily at 6 PM. 10/22/19  Yes Georgette Shell, MD  cholecalciferol (VITAMIN D) 25 MCG (1000 UNIT) tablet Take 1,000 Units by mouth daily.   Yes [provider]  clopidogrel (PLAVIX) 75 MG tablet Take 1 tablet (75 mg total) by mouth daily with breakfast. 10/06/20  Yes Eileen Stanford, PA-C  glimepiride (AMARYL) 1 MG tablet Take 1 mg by mouth daily with breakfast.   Yes [provider]  Glycerin-Hypromellose-PEG 400 (VISINE DRY EYE OP) Apply 1 drop to eye 3 (three) times daily as needed (for dry eye relief).    Yes [provider]  LUTEIN PO Take 1 capsule by mouth daily.   Yes [provider]  meclizine (ANTIVERT) 12.5 MG tablet Take 1 tablet (12.5 mg total) by mouth 3 (three) times daily as needed for dizziness. 12/14/19  Yes Virgel Manifold, MD  metoprolol tartrate (LOPRESSOR) 25 MG tablet Take 0.5 tablets (12.5 mg total) by mouth 2 (two) times daily. 05/07/20  Yes Mikhail, [place], DO  Multiple Vitamins-Minerals (PRESERVISION AREDS 2 PO) Take 1 tablet by mouth 2 (two) times daily. Takes 1 tablet in the morning and 1 tablet at night for eyes.   Yes [provider]  pantoprazole (PROTONIX) 40 MG tablet Take 1 tablet (40 mg total) by mouth daily. 09/09/20  Yes Sherren Mocha, MD    Review of Systems:  Constitutional:  No weight loss, night sweats, Fevers, chills, fatigue.  Head&Eyes: No headache.  No vision loss.  No eye pain or scotoma ENT:  No Difficulty swallowing,Tooth/dental problems,Sore throat,  No ear ache, post nasal drip,  Cardio-vascular:  No chest pain, Orthopnea, PND, swelling in lower extremities,  dizziness, palpitations  GI:  No  abdominal pain, nausea, vomiting, diarrhea, loss of appetite, heartburn, indigestion, Resp:  No shortness of breath with exertion or at rest. No cough. No coughing up of blood .No wheezing.No chest wall deformity  Skin:  no rash or lesions.  GU:  no dysuria, change in color of urine, no urgency or  frequency. No flank pain.  Musculoskeletal:  No joint pain or swelling. No decreased range of motion. No back pain.  Psych:  No change in mood or affect. No depression or anxiety. Neurologic: No headache, no dysesthesia, no focal weakness, no vision loss. No syncope  Physical Exam: Vitals:   11/26/20 1445 11/26/20 1523 11/26/20 1530 11/26/20 1533  BP:   (!) 109/52   Pulse: 79 80 78   Resp: 13     Temp:    98 F (36.7 C)  TempSrc:    Oral  SpO2: 98% 100% 100%   Weight:      Height:       General:  A&O x 3, NAD, nontoxic, pleasant/cooperative Head/Eye: No conjunctival hemorrhage, no icterus, [place]/AT, No nystagmus ENT:  No icterus,  No thrush, good dentition, no pharyngeal exudate Neck:  No masses, no lymphadenpathy, no bruits CV:  RRR, no rub, no gallop, no S3 Lung:  CTAB, good air movement, no wheeze, no rhonchi Abdomen: soft/NT, +BS, nondistended, no peritoneal signs Ext: No cyanosis, No rashes, No petechiae, No lymphangitis, No edema Neuro: CNII-XII intact, strength 4/5 in bilateral upper and lower extremities, no dysmetria  Labs on Admission:  Basic Metabolic Panel: Recent Labs  Lab 11/26/20 1154  NA 137  K 3.7  CL 103  CO2 22  GLUCOSE 209*  BUN 22  CREATININE 1.15*  CALCIUM 8.8*   Liver Function Tests: Recent Labs  Lab 11/26/20 1154  AST 35  ALT 28  ALKPHOS 65  BILITOT 0.9  PROT 6.3*  ALBUMIN 3.4*   No results for input(s): LIPASE, AMYLASE in the last 168 hours. No results for input(s): AMMONIA in the last 168 hours. CBC: Recent Labs  Lab 11/26/20 1154  WBC 6.3  NEUTROABS 4.0  HGB 7.9*  HCT 25.6*  MCV 87.1  PLT 229   Coagulation Profile: No results for input(s): INR, PROTIME in the last 168 hours. Cardiac Enzymes: No results for input(s): CKTOTAL, CKMB, CKMBINDEX, TROPONINI in the last 168 hours. BNP: Invalid input(s): POCBNP CBG: No results for input(s): GLUCAP in the last 168 hours. Urine analysis:    Component Value Date/Time    COLORURINE STRAW (A) 09/30/2020 0724   APPEARANCEUR CLEAR 09/30/2020 0724   LABSPEC 1.009 09/30/2020 0724   PHURINE 5.0 09/30/2020 0724   GLUCOSEU NEGATIVE 09/30/2020 0724   HGBUR SMALL (A) 09/30/2020 0724   BILIRUBINUR NEGATIVE 09/30/2020 0724   KETONESUR NEGATIVE 09/30/2020 0724   PROTEINUR NEGATIVE 09/30/2020 0724   UROBILINOGEN 1.0 05/23/2015 1956   NITRITE NEGATIVE 09/30/2020 0724   LEUKOCYTESUR NEGATIVE 09/30/2020 0724   Sepsis Labs: @LABRCNTIP (procalcitonin:4,lacticidven:4) ) Recent Results (from the past 240 hour(s))  Resp Panel by RT-PCR (Flu A&B, Covid) Nasopharyngeal Swab     Status: None   Collection Time: 11/26/20  2:02 PM   Specimen: Nasopharyngeal Swab; Nasopharyngeal(NP) swabs in vial transport medium  Result Value Ref Range Status   SARS Coronavirus 2 by RT PCR NEGATIVE NEGATIVE Final    Comment: (NOTE) SARS-CoV-2 target nucleic acids are NOT DETECTED.  The SARS-CoV-2 RNA is generally detectable in upper respiratory specimens during the acute phase of infection. The lowest concentration of SARS-CoV-2 viral copies this assay can detect is 138 copies/mL. A negative result does not preclude SARS-Cov-2 infection and should not be used as the sole basis for treatment or other patient management decisions. A negative result may occur with  improper specimen collection/handling, submission of specimen other than nasopharyngeal swab, presence of viral mutation(s) within the areas targeted by this assay, and inadequate number of viral copies(<138 copies/mL). A negative result must be combined with clinical observations, patient history, and epidemiological information. The expected result is Negative.  Fact Sheet for Patients:  EntrepreneurPulse.com.au  Fact Sheet for Healthcare Providers:  IncredibleEmployment.be  This test is no t yet approved or cleared by the [place] FDA and  has been authorized for detection and/or  diagnosis of SARS-CoV-2 by FDA under an Emergency Use Authorization (EUA). This EUA will remain  in effect (meaning this test can be used) for the duration of the COVID-19 declaration under Section 564(b)(1) of the Act, 21 U.S.C.section 360bbb-3(b)(1), unless the authorization is terminated  or  revoked sooner.       Influenza A by PCR NEGATIVE NEGATIVE Final   Influenza B by PCR NEGATIVE NEGATIVE Final    Comment: (NOTE) The Xpert Xpress SARS-CoV-2/FLU/RSV plus assay is intended as an aid in the diagnosis of influenza from Nasopharyngeal swab specimens and should not be used as a sole basis for treatment. Nasal washings and aspirates are unacceptable for Xpert Xpress SARS-CoV-2/FLU/RSV testing.  Fact Sheet for Patients: EntrepreneurPulse.com.au  Fact Sheet for Healthcare Providers: IncredibleEmployment.be  This test is not yet approved or cleared by the [place] FDA and has been authorized for detection and/or diagnosis of SARS-CoV-2 by FDA under an Emergency Use Authorization (EUA). This EUA will remain in effect (meaning this test can be used) for the duration of the COVID-19 declaration under Section 564(b)(1) of the Act, 21 U.S.C. section 360bbb-3(b)(1), unless the authorization is terminated or revoked.  Performed at [facility], [address], [place], [place] [address]      Radiological Exams on Admission: DG [facility] 1 View  Result Date: 11/26/2020 CLINICAL DATA:  Chest pain.  Gastrointestinal bleeding EXAM: PORTABLE CHEST 1 VIEW COMPARISON:  September 24, 2020 FINDINGS: No edema or airspace opacity. Heart size and pulmonary vascular normal. No adenopathy. Aortic valve replacement present. There is aortic atherosclerosis. There is postoperative change in the lower cervical spine. Bones are osteoporotic. IMPRESSION: No edema or airspace opacity. Heart size within normal limits. Status post aortic valve replacement. Aortic  Atherosclerosis (ICD10-I70.0). Electronically Signed   By: Lowella Grip III M.D.   On: 11/26/2020 14:30    EKG: Independently reviewed. Sinus, nonspecific STT changes    Time spent:60 minutes Code Status:   FULL Family Communication:  Daughter updated bedside Disposition Plan: expect 1-2 day hospitalization Consults called: GI DVT Prophylaxis: SCDs  Orson Eva, DO  [facility] Pager [phone]  If 7PM-7AM, please contact night-coverage [email or web address] Password [facility] 11/26/2020, 3:36 PM

## 2020-11-26 NOTE — ED Notes (Signed)
Patient denies pain and is resting comfortably.  

## 2020-11-26 NOTE — ED Provider Notes (Signed)
[facility] Provider Note   CSN: [id number] Arrival date & time: 11/26/20  1116     History Chief Complaint  Patient presents with  . GI Bleeding    Denise Parrish is a 87 y.o. female.  Patient brought in for black stool maroon today.  Patient had a history of significant GI bleed back in August.  Patient without any abdominal pain no chest pain no shortness of breath no lightheadedness.  States that the stools been like this for a few days.  Patient had a heart valve replacement done since the big GI bleed in August.  And it has to be on Plavix.  Extensive work-up by GI medicine down in [facility] for the previous GI bleed without any conclusive findings.  They tried to do the capsule but it never left her stomach.  Patient is only on Plavix as a blood thinner.        Past Medical History:  Diagnosis Date  . Anxiety   . Carotid stenosis, right 10/16/2019   75% on CTA neck  . Cholelithiasis    on u/s 09/2011  . Degenerative joint disease    Right THA in 1995; cervical discectomy and fusion-2001  . Diabetes mellitus, type 2 (HCC)    + neuropathy  . Emphysema   . Gastroesophageal reflux disease   . Hiatal hernia   . History of CVA (cerebrovascular accident)   . Hyperlipidemia   . Hypertension   . IDA (iron deficiency anemia)    labs 09/2011  . Mild cognitive impairment   . Obesity   . Osteoporosis   . Pneumonia   . 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 Severe aortic stenosis   . Sigmoid diverticulosis   . Small bowel lesion    On Given's capsule; Dr GKathleene Hazel11/2013, no further w/u needed    Patient Active Problem List   Diagnosis Date Noted  . 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 History of CVA (cerebrovascular accident)   . Severe aortic stenosis 09/08/2020  . Closed left ankle fracture, initial encounter  05/04/2020  . Essential hypertension 10/16/2019  . Anxiety 12/08/2017  . GERD (gastroesophageal reflux disease) 03/14/2012  . Anemia, iron deficiency 10/28/2011  . Diabetes mellitus, type 2 ([place]   . Hypertension   . Hyperlipidemia   . Obesity     Past Surgical History:  Procedure Laterality Date  . ANTERIOR CERVICAL DISCECTOMY  2001   C4-5, allograft, fixation  . BREAST LUMPECTOMY     benign  . CATARACT EXTRACTION W/ INTRAOCULAR LENS IMPLANT  2007   Left  . COLONOSCOPY  2006  . COLONOSCOPY  04/03/12   Dr. ROk Edwardsdiverticulosis, negative microscopic  colitis   . CORONARY STENT INTERVENTION N/A 09/08/2020   Procedure: CORONARY STENT INTERVENTION;  Surgeon: CSherren Mocha MD;  [facility];  Service: Cardiovascular;  Laterality: N/A;  . ENDARTERECTOMY Right 10/21/2019   Procedure: ENDARTERECTOMY CAROTID RIGHT;  Surgeon: CWaynetta Sandy MD;  [place]  Service: Vascular;  Laterality: Right;  . ESOPHAGOGASTRODUODENOSCOPY  04/03/12   Dr. RJennet Madurohernia, chronic gastritis on bx  . ESOPHAGOGASTRODUODENOSCOPY (EGD) WITH PROPOFOL N/A 04/28/2020   Procedure: ESOPHAGOGASTRODUODENOSCOPY (EGD) WITH PROPOFOL;  Surgeon: BOtis Brace MD;  [place] [facility]  Service: Gastroenterology;  Laterality: N/A;  . FEMORAL ARTERY EXPLORATION Right 09/28/2020   Procedure: RIGHT GROIN  EXPLORATION WITH REPAIR RIGHT COMMON FEMORAL ARTERY;  Surgeon: [name], MD;  Location: [facility];  Service: Vascular;  Laterality: Right;  . GIVENS CAPSULE STUDY  05/09/2012   RMR: an unclear raised area of small bowel was noted, with features almost  characteristic of very small polyp. This was without villous  blunting or any evidence of active bleeding; yet, the area of  concern appeared to be erythematous. However, this could simply  be a light reflection on a normal variation of the small bowel. REFERRED TO DR. GILLIAM, APPT FOR NOVEMBER 2013.   [name] GIVENS CAPSULE STUDY N/A  04/30/2020   Procedure: GIVENS CAPSULE STUDY;  Surgeon: Otis Brace, MD;  Location: [facility];  Service: Gastroenterology;  Laterality: N/A;  . HIP ARTHROPLASTY Right   . PATCH ANGIOPLASTY Right 10/21/2019   Procedure: PATCH ANGIOPLASTY USING Rueben Bash BIOLOGIC PATCH;  Surgeon: Waynetta Sandy, MD;  Location: [place];  Service: Vascular;  Laterality: Right;  . POLYPECTOMY  04/28/2020   Procedure: POLYPECTOMY;  Surgeon: Otis Brace, MD;  Location: [facility];  Service: Gastroenterology;;  . RIGHT/LEFT HEART CATH AND CORONARY ANGIOGRAPHY N/A 09/08/2020   Procedure: RIGHT/LEFT HEART CATH AND CORONARY ANGIOGRAPHY;  Surgeon: Sherren Mocha, MD;  Location: [facility];  Service: Cardiovascular;  Laterality: N/A;  . TEE WITHOUT CARDIOVERSION N/A 09/28/2020   Procedure: TRANSESOPHAGEAL ECHOCARDIOGRAM (TEE);  Surgeon: Sherren Mocha, MD;  Location: [place];  Service: Open Heart Surgery;  Laterality: N/A;  . TONSILLECTOMY    . TOTAL HIP ARTHROPLASTY  1995   Right  . TRANSCATHETER AORTIC VALVE REPLACEMENT, TRANSFEMORAL N/A 09/28/2020   Procedure: TRANSCATHETER AORTIC VALVE REPLACEMENT, TRANSFEMORAL;  Surgeon: Sherren Mocha, MD;  Location: [facility];  Service: Open Heart Surgery;  Laterality: N/A;     OB History   No obstetric history on file.     Family History  Problem Relation Age of Onset  . Cirrhosis Mother        no etoh use  . CAD Father   . Diabetes Mother        + brother  . Heart failure Father   . Hypertension Father        + brother  . Cancer Brother        lung, age 40  . Uterine cancer Sister   . Colon cancer Neg Hx     Social History   Tobacco Use  . Smoking status: Never Smoker  . Smokeless tobacco: Never Used  . Tobacco comment: Quit x 50 years  Vaping Use  . Vaping Use: Never used  Substance Use Topics  . Alcohol use: Never  . Drug use: Never    Home Medications Prior to Admission medications   Medication Sig Start Date End Date  Taking? Authorizing Provider  ALPRAZolam Duanne Moron) 0.5 MG tablet Take 0.5 mg by mouth at bedtime as needed for anxiety.   Yes [provider]  Ascorbic Acid (VITAMIN C PO) Take 1 tablet by mouth daily.   Yes [provider]  atorvastatin (LIPITOR) 40 MG tablet Take 1 tablet (40 mg total) by mouth daily at 6 PM. 10/22/19  Yes Georgette Shell, MD  cholecalciferol (VITAMIN D) 25 MCG (1000 UNIT) tablet Take 1,000 Units by mouth daily.   Yes [provider]  clopidogrel (PLAVIX) 75 MG tablet Take 1 tablet (75 mg total) by mouth daily with breakfast. 10/06/20  Yes Eileen Stanford, PA-C  glimepiride (AMARYL) 1 MG tablet Take 1 mg by mouth daily  with breakfast.   Yes [provider]  Glycerin-Hypromellose-PEG 400 (VISINE DRY EYE OP) Apply 1 drop to eye 3 (three) times daily as needed (for dry eye relief).    Yes [provider]  LUTEIN PO Take 1 capsule by mouth daily.   Yes [provider]  meclizine (ANTIVERT) 12.5 MG tablet Take 1 tablet (12.5 mg total) by mouth 3 (three) times daily as needed for dizziness. 12/14/19  Yes Virgel Manifold, MD  metoprolol tartrate (LOPRESSOR) 25 MG tablet Take 0.5 tablets (12.5 mg total) by mouth 2 (two) times daily. 05/07/20  Yes Mikhail, [place], DO  Multiple Vitamins-Minerals (PRESERVISION AREDS 2 PO) Take 1 tablet by mouth 2 (two) times daily. Takes 1 tablet in the morning and 1 tablet at night for eyes.   Yes [provider]  pantoprazole (PROTONIX) 40 MG tablet Take 1 tablet (40 mg total) by mouth daily. 09/09/20  Yes Sherren Mocha, MD    Allergies    Patient has no known allergies.  Review of Systems   Review of Systems  Constitutional: Negative for chills and fever.  HENT: Negative for congestion, rhinorrhea and sore throat.   Eyes: Negative for visual disturbance.  Respiratory: Negative for cough and shortness of breath.   Cardiovascular: Negative for chest pain and leg swelling.   Gastrointestinal: Positive for blood in stool. Negative for abdominal pain, diarrhea, nausea and vomiting.  Genitourinary: Negative for dysuria.  Musculoskeletal: Negative for back pain and neck pain.  Skin: Negative for rash.  Neurological: Negative for dizziness, light-headedness and headaches.  Hematological: Does not bruise/bleed easily.  Psychiatric/Behavioral: Negative for confusion. The patient is nervous/anxious.     Physical Exam Updated Vital Signs BP (!) 133/54   Pulse 80   Temp 97.8 F (36.6 C) (Oral)   Resp 13   Ht 1.651 m (5' 5" )   Wt 75.4 kg   SpO2 100%   BMI 27.66 kg/m   Physical Exam Vitals and nursing note reviewed.  Constitutional:      General: She is not in acute distress.    Appearance: Normal appearance. She is well-developed.  HENT:     Head: Normocephalic and atraumatic.  Eyes:     Extraocular Movements: Extraocular movements intact.     Conjunctiva/sclera: Conjunctivae normal.     Pupils: Pupils are equal, round, and reactive to light.  Cardiovascular:     Rate and Rhythm: Normal rate and regular rhythm.     Heart sounds: No murmur heard.   Pulmonary:     Effort: Pulmonary effort is normal. No respiratory distress.     Breath sounds: Normal breath sounds.  Abdominal:     General: There is no distension.     Palpations: Abdomen is soft.     Tenderness: There is no abdominal tenderness. There is no guarding.  Genitourinary:    Rectum: Guaiac result positive.  Musculoskeletal:        General: Normal range of motion.     Cervical back: Normal range of motion and neck supple.  Skin:    General: Skin is warm and dry.     Capillary Refill: Capillary refill takes less than 2 seconds.  Neurological:     General: No focal deficit present.     Mental Status: She is alert and oriented to person, place, and time.     Cranial Nerves: No cranial nerve deficit.     Sensory: No sensory deficit.     Motor: No weakness.  ED Results /  Procedures / Treatments   Labs (all labs ordered are listed, but only abnormal results are displayed) Labs Reviewed  CBC WITH DIFFERENTIAL/PLATELET - Abnormal; Notable for the following components:      Result Value   RBC 2.94 (*)    Hemoglobin 7.9 (*)    HCT 25.6 (*)    All other components within normal limits  BASIC METABOLIC PANEL - Abnormal; Notable for the following components:   Glucose, Bld 209 (*)    Creatinine, Ser 1.15 (*)    Calcium 8.8 (*)    GFR, Estimated 44 (*)    All other components within normal limits  HEPATIC FUNCTION PANEL - Abnormal; Notable for the following components:   Total Protein 6.3 (*)    Albumin 3.4 (*)    All other components within normal limits  POC OCCULT BLOOD, ED - Abnormal; Notable for the following components:   Fecal Occult Bld POSITIVE (*)    All other components within normal limits  RESP PANEL BY RT-PCR (FLU A&B, COVID) ARPGX2  TYPE AND SCREEN  PREPARE RBC (CROSSMATCH)    EKG EKG Interpretation  Date/Time:  Friday November 26 2020 11:59:22 EST Ventricular Rate:  58 PR Interval:    QRS Duration: 164 QT Interval:  411 QTC Calculation: 401 R Axis:   45 Text Interpretation: Sinus rhythm Prolonged PR interval Prominent P waves, nondiagnostic Probable left ventricular hypertrophy ST depr, consider ischemia, inferior leads Abnormal T, consider ischemia, inferior leads Interpretation limited secondary to artifact Otherwise no significant change Confirmed by Fredia Sorrow [phone]) on 11/26/2020 12:09:41 PM   Radiology DG Chest Port 1 View  Result Date: 11/26/2020 CLINICAL DATA:  Chest pain.  Gastrointestinal bleeding EXAM: PORTABLE CHEST 1 VIEW COMPARISON:  September 24, 2020 FINDINGS: No edema or airspace opacity. Heart size and pulmonary vascular normal. No adenopathy. Aortic valve replacement present. There is aortic atherosclerosis. There is postoperative change in the lower cervical spine. Bones are osteoporotic. IMPRESSION: No edema or  airspace opacity. Heart size within normal limits. Status post aortic valve replacement. Aortic Atherosclerosis (ICD10-I70.0). Electronically Signed   By: Lowella Grip III M.D.   On: 11/26/2020 14:30    Procedures Procedures   CRITICAL CARE Performed by: Fredia Sorrow Total critical care time: 45 minutes Critical care time was exclusive of separately billable procedures and treating other patients. Critical care was necessary to treat or prevent imminent or life-threatening deterioration. Critical care was time spent personally by me on the following activities: development of treatment plan with patient and/or surrogate as well as nursing, discussions with consultants, evaluation of patient's response to treatment, examination of patient, obtaining history from patient or surrogate, ordering and performing treatments and interventions, ordering and review of laboratory studies, ordering and review of radiographic studies, pulse oximetry and re-evaluation of patient's condition.   Medications Ordered in ED Medications  0.9 %  sodium chloride infusion ( Intravenous Infusion Verify 11/26/20 1523)  0.9 %  sodium chloride infusion (has no administration in time range)  LORazepam (ATIVAN) injection 0.5 mg (0.5 mg Intravenous Given 11/26/20 1349)    ED Course  I have reviewed the triage vital signs and the nursing notes.  Pertinent labs & imaging results that were available during my care of the patient were reviewed by me and considered in my medical decision making (see chart for details).    MDM Rules/Calculators/A&P  Discussed with on-call GI medicine.  Dr. Daneil Dolin.  And he is recommending 1 unit of blood transfusion is planning on doing upper and lower endoscopy tomorrow.  Patient hemodynamically stable.  Patient's hemoglobin here is 7.9.  Blood transfusion ordered  Discussed with hospitalist who will admit for GI medicine.    Final Clinical  Impression(s) / ED Diagnoses Final diagnoses:  Gastrointestinal hemorrhage, unspecified gastrointestinal hemorrhage type  Melena    Rx / DC Orders ED Discharge Orders    None       Fredia Sorrow, MD 11/26/20 1530

## 2020-11-26 NOTE — Consult Note (Signed)
Referring Provider: ED Physician Primary Care Physician:  [name], MD Primary Gastroenterologist:  Dr. Gala Romney  Date of Admission: 11/26/20 Date of Consultation: 11/26/20  Reason for Consultation:  Melena, anemia  HPI:  Denise Parrish is a 87 y.o. female with a past medical history of single episode of rectal carotid stenosis, type 2 diabetes, emphysema, GERD, CVA, hypertension, IDA, mild cognitive impairment, status post TAVR, severe aortic stenosis, sigmoid diverticulosis, small bowel lesion on Givens capsule at [facility] in 2013 with no further work-up needed.  The patient was previously admitted for acute GI bleed on 04/26/2020 and discharged 05/07/2020.  At that time she presented with 5 days of pallor and weakness with some chest pressure and dyspnea as well as palpitations.  Noted epigastric discomfort but no nausea or vomiting.  Also noted dark stools at that time.  Hemoglobin was 7.2 she was given blood, placed on Protonix and underwent EGD on 08/07/2020 with no evidence of active bleeding with 1 medium size gastric polyp removed.  CT of the abdomen at that time with severe sigmoid diverticulosis and cholelithiasis but no intra-abdominal or pelvic pathology.  Tagged RBC scan during admission with no evidence of active bleeding.  Capsule endoscopy placed which was limited as capsule remained in the stomach for an 11 hours and subsequently limited evaluation of the duodenum.  After discussion with the family no further investigation was desired and she was discharged.  Interestingly, the patient was initially seen at [facility] but was transferred to [facility] for care after discussion with anesthesia who deemed that she is not a candidate for anesthesia here, likely due to severe aortic stenosis.  Last colonoscopy 04/03/2012 which found colonic diverticulosis status post segmental biopsy which was identified is benign colonic mucosa.  The patient presents today by EMS for  melena and anemia.  Hemoglobin was found to be 7.9.  Nursing staff noted dark/maroon stools as relayed by the patient.  Heme stool card positive.  HFP with normal liver function.  SARS-CoV-2 testing in process.  Today she states she's feeling better now after something for her nerves. She is actively receiving blood when I entered. States she started noticing dark/black stools on Tuesday, but it took a day or 2 for her to realize what was going on.  She is also noted some progressive weakness, fatigue.  Denies any abdominal pain, nausea, vomiting, GERD symptoms.  States she takes a pill for GERD and it works well.  Denies diarrhea or bright red blood per rectum.  No other overt GI complaints.  Past Medical History:  Diagnosis Date  . Anxiety   . Carotid stenosis, right 10/16/2019   75% on CTA neck  . Cholelithiasis    on u/s 09/2011  . Degenerative joint disease    Right THA in 1995; cervical discectomy and fusion-2001  . Diabetes mellitus, type 2 (HCC)    + neuropathy  . Emphysema   . Gastroesophageal reflux disease   . Hiatal hernia   . History of CVA (cerebrovascular accident)   . Hyperlipidemia   . Hypertension   . IDA (iron deficiency anemia)    labs 09/2011  . Mild cognitive impairment   . Obesity   . Osteoporosis   . Pneumonia   . 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   s/p TAVR with a 17mm Medtronic Evolut Pro+ via the TF approach   . Severe aortic stenosis   . Sigmoid diverticulosis   . Small bowel lesion  On Given's capsule; Dr Kathleene Hazel 07/2012, no further w/u needed    Past Surgical History:  Procedure Laterality Date  . ANTERIOR CERVICAL DISCECTOMY  2001   C4-5, allograft, fixation  . BREAST LUMPECTOMY     benign  . CATARACT EXTRACTION W/ INTRAOCULAR LENS IMPLANT  2007   Left  . COLONOSCOPY  2006  . COLONOSCOPY  04/03/12   Dr. Ok Edwards diverticulosis, negative microscopic  colitis   . CORONARY STENT INTERVENTION N/A  09/08/2020   Procedure: CORONARY STENT INTERVENTION;  Surgeon: Sherren Mocha, MD;  Location: [place] [facility];  Service: Cardiovascular;  Laterality: N/A;  . ENDARTERECTOMY Right 10/21/2019   Procedure: ENDARTERECTOMY CAROTID RIGHT;  Surgeon: Waynetta Sandy, MD;  Location: [place];  Service: Vascular;  Laterality: Right;  . ESOPHAGOGASTRODUODENOSCOPY  04/03/12   Dr. Jennet Maduro hernia, chronic gastritis on bx  . ESOPHAGOGASTRODUODENOSCOPY (EGD) WITH PROPOFOL N/A 04/28/2020   Procedure: ESOPHAGOGASTRODUODENOSCOPY (EGD) WITH PROPOFOL;  Surgeon: Otis Brace, MD;  Location: [place];  Service: Gastroenterology;  Laterality: N/A;  . FEMORAL ARTERY EXPLORATION Right 09/28/2020   Procedure: RIGHT GROIN EXPLORATION WITH REPAIR RIGHT COMMON FEMORAL ARTERY;  Surgeon: [name], MD;  Location: [place];  Service: Vascular;  Laterality: Right;  . GIVENS CAPSULE STUDY  05/09/2012   RMR: an unclear raised area of small bowel was noted, with features almost  characteristic of very small polyp. This was without villous  blunting or any evidence of active bleeding; yet, the area of  concern appeared to be erythematous. However, this could simply  be a light reflection on a normal variation of the small bowel. REFERRED TO DR. GILLIAM, APPT FOR NOVEMBER 2013.   [name] GIVENS CAPSULE STUDY N/A 04/30/2020   Procedure: GIVENS CAPSULE STUDY;  Surgeon: Otis Brace, MD;  Location: [place];  Service: Gastroenterology;  Laterality: N/A;  . HIP ARTHROPLASTY Right   . PATCH ANGIOPLASTY Right 10/21/2019   Procedure: PATCH ANGIOPLASTY USING Rueben Bash BIOLOGIC PATCH;  Surgeon: Waynetta Sandy, MD;  Location: [place];  Service: Vascular;  Laterality: Right;  . POLYPECTOMY  04/28/2020   Procedure: POLYPECTOMY;  Surgeon: Otis Brace, MD;  Location: [place] [facility];  Service: Gastroenterology;;  . RIGHT/LEFT HEART CATH AND CORONARY ANGIOGRAPHY N/A 09/08/2020   Procedure: RIGHT/LEFT HEART CATH AND  CORONARY ANGIOGRAPHY;  Surgeon: Sherren Mocha, MD;  Location: [place] [facility];  Service: Cardiovascular;  Laterality: N/A;  . TEE WITHOUT CARDIOVERSION N/A 09/28/2020   Procedure: TRANSESOPHAGEAL ECHOCARDIOGRAM (TEE);  Surgeon: Sherren Mocha, MD;  Location: [place];  Service: Open Heart Surgery;  Laterality: N/A;  . TONSILLECTOMY    . TOTAL HIP ARTHROPLASTY  1995   Right  . TRANSCATHETER AORTIC VALVE REPLACEMENT, TRANSFEMORAL N/A 09/28/2020   Procedure: TRANSCATHETER AORTIC VALVE REPLACEMENT, TRANSFEMORAL;  Surgeon: Sherren Mocha, MD;  Location: [place];  Service: Open Heart Surgery;  Laterality: N/A;    Prior to Admission medications   Medication Sig Start Date End Date Taking? Authorizing Provider  ALPRAZolam Duanne Moron) 0.5 MG tablet Take 0.5 mg by mouth at bedtime as needed for anxiety.   Yes [provider]  Ascorbic Acid (VITAMIN C PO) Take 1 tablet by mouth daily.   Yes [provider]  atorvastatin (LIPITOR) 40 MG tablet Take 1 tablet (40 mg total) by mouth daily at 6 PM. 10/22/19  Yes Georgette Shell, MD  cholecalciferol (VITAMIN D) 25 MCG (1000 UNIT) tablet Take 1,000 Units by mouth daily.   Yes [provider]  clopidogrel (PLAVIX) 75 MG  tablet Take 1 tablet (75 mg total) by mouth daily with breakfast. 10/06/20  Yes Eileen Stanford, PA-C  glimepiride (AMARYL) 1 MG tablet Take 1 mg by mouth daily with breakfast.   Yes [provider]  Glycerin-Hypromellose-PEG 400 (VISINE DRY EYE OP) Apply 1 drop to eye 3 (three) times daily as needed (for dry eye relief).    Yes [provider]  LUTEIN PO Take 1 capsule by mouth daily.   Yes [provider]  meclizine (ANTIVERT) 12.5 MG tablet Take 1 tablet (12.5 mg total) by mouth 3 (three) times daily as needed for dizziness. 12/14/19  Yes Virgel Manifold, MD  metoprolol tartrate (LOPRESSOR) 25 MG tablet Take 0.5 tablets (12.5 mg total) by mouth 2 (two) times daily. 05/07/20  Yes Mikhail,  [place], DO  Multiple Vitamins-Minerals (PRESERVISION AREDS 2 PO) Take 1 tablet by mouth 2 (two) times daily. Takes 1 tablet in the morning and 1 tablet at night for eyes.   Yes [provider]  pantoprazole (PROTONIX) 40 MG tablet Take 1 tablet (40 mg total) by mouth daily. 09/09/20  Yes Sherren Mocha, MD    Current Facility-Administered Medications  Medication Dose Route Frequency Provider Last Rate Last Admin  . 0.9 %  sodium chloride infusion   Intravenous Continuous Fredia Sorrow, MD 75 mL/hr at 11/26/20 1444 Infusion Verify at 11/26/20 1444  . 0.9 %  sodium chloride infusion  10 mL/hr Intravenous Once Fredia Sorrow, MD       Current Outpatient Medications  Medication Sig Dispense Refill  . ALPRAZolam (XANAX) 0.5 MG tablet Take 0.5 mg by mouth at bedtime as needed for anxiety.    . Ascorbic Acid (VITAMIN C PO) Take 1 tablet by mouth daily.    [name] atorvastatin (LIPITOR) 40 MG tablet Take 1 tablet (40 mg total) by mouth daily at 6 PM. 30 tablet 2  . cholecalciferol (VITAMIN D) 25 MCG (1000 UNIT) tablet Take 1,000 Units by mouth daily.    . clopidogrel (PLAVIX) 75 MG tablet Take 1 tablet (75 mg total) by mouth daily with breakfast. 90 tablet 1  . glimepiride (AMARYL) 1 MG tablet Take 1 mg by mouth daily with breakfast.    . Glycerin-Hypromellose-PEG 400 (VISINE DRY EYE OP) Apply 1 drop to eye 3 (three) times daily as needed (for dry eye relief).     . LUTEIN PO Take 1 capsule by mouth daily.    . meclizine (ANTIVERT) 12.5 MG tablet Take 1 tablet (12.5 mg total) by mouth 3 (three) times daily as needed for dizziness. 20 tablet 0  . metoprolol tartrate (LOPRESSOR) 25 MG tablet Take 0.5 tablets (12.5 mg total) by mouth 2 (two) times daily. 30 tablet 0  . Multiple Vitamins-Minerals (PRESERVISION AREDS 2 PO) Take 1 tablet by mouth 2 (two) times daily. Takes 1 tablet in the morning and 1 tablet at night for eyes.    . pantoprazole (PROTONIX) 40 MG tablet Take 1 tablet (40 mg total)  by mouth daily. 90 tablet 3    Allergies as of 11/26/2020  . (No Known Allergies)    Family History  Problem Relation Age of Onset  . Cirrhosis Mother        no etoh use  . CAD Father   . Diabetes Mother        + brother  . Heart failure Father   . Hypertension Father        + brother  . Cancer Brother  lung, age 48  . Uterine cancer Sister   . Colon cancer Neg Hx     Social History   Socioeconomic History  . Marital status: Divorced    Spouse name: Not on file  . Number of children: 4  . Years of education: 6th grade  . Highest education level: Not on file  Occupational History  . Occupation: Retired    Comment: [employer]  Tobacco Use  . Smoking status: Never Smoker  . Smokeless tobacco: Never Used  . Tobacco comment: Quit x 50 years  Vaping Use  . Vaping Use: Never used  Substance and Sexual Activity  . Alcohol use: Never  . Drug use: Never  . Sexual activity: Not Currently  Other Topics Concern  . Not on file  Social History Narrative   Lives alone. Every other week she has family with her. Daughter and son take care of her during the days.    Right-handed.   No daily use of caffeine.             Social Determinants of Health   Financial Resource Strain: Not on file  Food Insecurity: Not on file  Transportation Needs: Not on file  Physical Activity: Not on file  Stress: Not on file  Social Connections: Not on file  Intimate Partner Violence: Not on file    Review of Systems: General: Negative for anorexia, weight loss, fever, chills. Eyes: Negative for vision changes.  ENT: Negative for hoarseness, difficulty swallowing. CV: Negative for chest pain, angina, palpitations, peripheral edema.  Respiratory: Negative for dyspnea at rest, cough, sputum, wheezing.  GI: See history of present illness. Neuro: Negative for weakness, abnormal sensation, seizure, frequent headaches, memory loss, confusion.  Psych: Negative for anxiety, depression,  suicidal ideation, hallucinations.  Endo: Negative for unusual weight change.  Heme: Negative for bruising. Allergy: Negative for rash or hives.  Physical Exam: Vital signs in last 24 hours: Temp:  [97.8 F (36.6 C)] 97.8 F (36.6 C) (03/11 1122) Pulse Rate:  [60-81] 79 (03/11 1445) Resp:  [13-23] 13 (03/11 1445) BP: (109-146)/(50-65) 133/54 (03/11 1430) SpO2:  [94 %-100 %] 98 % (03/11 1445) Weight:  [75.4 kg] 75.4 kg (03/11 1118)   General:   Alert,  Well-developed, well-nourished, pleasant and cooperative in NAD Head:  Normocephalic and atraumatic. Eyes:  Sclera clear, no icterus. Conjunctiva pink. Ears:  Normal auditory acuity. Neck:  Supple; no masses or thyromegaly. Lungs:  Clear throughout to auscultation. No wheezes, crackles, or rhonchi. No acute distress. Heart:  Regular rate and rhythm; no murmurs, clicks, rubs, or gallops. Abdomen:  Rounded but soft, nontender and nondistended. No masses, hepatosplenomegaly or hernias noted. Normal bowel sounds, without guarding, and without rebound.   Rectal:  Deferred.   Msk:  Symmetrical without gross deformities. Pulses:  Normal bilateral DP pulses noted. Extremities:  Without clubbing or edema. Neurologic:  Alert and  oriented x4;  grossly normal neurologically. Psych:  Alert and cooperative. Normal mood and affect.  Intake/Output from previous day: No intake/output data recorded. Intake/Output this shift: Total I/O In: 57.6 [I.V.:57.6] Out: -   Lab Results: Recent Labs    11/26/20 1154  WBC 6.3  HGB 7.9*  HCT 25.6*  PLT 229   BMET Recent Labs    11/26/20 1154  NA 137  K 3.7  CL 103  CO2 22  GLUCOSE 209*  BUN 22  CREATININE 1.15*  CALCIUM 8.8*   LFT Recent Labs    11/26/20 1154  PROT 6.3*  ALBUMIN 3.4*  AST 35  ALT 28  ALKPHOS 65  BILITOT 0.9  BILIDIR <0.1  IBILI NOT CALCULATED   PT/INR No results for input(s): LABPROT, INR in the last 72 hours. Hepatitis Panel No results for input(s):  HEPBSAG, HCVAB, HEPAIGM, HEPBIGM in the last 72 hours. C-Diff No results for input(s): CDIFFTOX in the last 72 hours.  Studies/Results: DG Chest Port 1 View  Result Date: 11/26/2020 CLINICAL DATA:  Chest pain.  Gastrointestinal bleeding EXAM: PORTABLE CHEST 1 VIEW COMPARISON:  September 24, 2020 FINDINGS: No edema or airspace opacity. Heart size and pulmonary vascular normal. No adenopathy. Aortic valve replacement present. There is aortic atherosclerosis. There is postoperative change in the lower cervical spine. Bones are osteoporotic. IMPRESSION: No edema or airspace opacity. Heart size within normal limits. Status post aortic valve replacement. Aortic Atherosclerosis (ICD10-I70.0). Electronically Signed   By: Lowella Grip III M.D.   On: 11/26/2020 14:30    Impression: Very pleasant 87 year old female who presents for recurrent melena and GI bleed with symptomatic anemia.  As described in HPI she was previously admitted in August 2021 for similar presentation.  She underwent EGD without obvious source.  Capsule endoscopy was also performed but the capsule stayed in the stomach for 11 hours and gave a very limited study.  Tagged RBC scan at that time also negative.  She did previously have severe aortic stenosis however has undergone TAVR.  Also with a recent coronary stent about 2 to 3 months ago prior to surgery.  Carotid stent also about 2 months ago.  States she feels much better after her new valve and stenting.  Improvement in heart failure from NYHA class III to class II.  Symptomatic anemia with melena: Black stools for 2 to 3 days, no associated abdominal pain or other GI symptoms.  This is a recurrence of similar symptoms from August 2021.  Hemoglobin on presentation was 7.9.  She is currently receiving a unit of blood.  She is on Plavix and needs to remain on Plavix given her recent TAVR and coronary stent/carotid stent.  Differentials are quite broad and could include slow colonic  bleed/right-sided bleed, small bowel bleed such as from AVM or erosions/ulcerations, gastritis, esophagitis, duodenitis, erosions, peptic ulcer disease.   Plan: 1. Clear liquids today, n.p.o. after midnight 2. Golytely colon prep this evening through the night 3. Tap water enema x 2 in the morning prior to endoscopic procedures 4. Colonoscopy +/- endoscopy +/- Givens capsule placement tomorrow 5. Further recommendations to follow 6. Transfuse as needed 7. Monitor for recurrent GI bleed 8. Follow hemoglobin closely 9. Notify GI of any change in status   Thank you for allowing [place] to participate in the care of Barnes, DNP, AGNP-C Adult & Gerontological Nurse Practitioner [facility]   LOS: 0 days     11/26/2020, 2:54 PM

## 2020-11-27 ENCOUNTER — Encounter (HOSPITAL_COMMUNITY)
Admission: EM | Disposition: A | Discharge status: Home / Self Care | Payer: Self-pay | Source: Home / Self Care | Attending: Internal Medicine

## 2020-11-27 ENCOUNTER — Observation Stay (HOSPITAL_COMMUNITY): Payer: Medicare Other | Admitting: Anesthesiology

## 2020-11-27 DIAGNOSIS — D120 Benign neoplasm of cecum: Secondary | ICD-10-CM | POA: Diagnosis not present

## 2020-11-27 DIAGNOSIS — D122 Benign neoplasm of ascending colon: Secondary | ICD-10-CM

## 2020-11-27 DIAGNOSIS — K922 Gastrointestinal hemorrhage, unspecified: Secondary | ICD-10-CM | POA: Diagnosis not present

## 2020-11-27 DIAGNOSIS — I10 Essential (primary) hypertension: Secondary | ICD-10-CM | POA: Diagnosis not present

## 2020-11-27 DIAGNOSIS — K5730 Diverticulosis of large intestine without perforation or abscess without bleeding: Secondary | ICD-10-CM | POA: Diagnosis not present

## 2020-11-27 DIAGNOSIS — D62 Acute posthemorrhagic anemia: Secondary | ICD-10-CM | POA: Diagnosis not present

## 2020-11-27 DIAGNOSIS — K921 Melena: Secondary | ICD-10-CM | POA: Diagnosis not present

## 2020-11-27 HISTORY — PX: ESOPHAGOGASTRODUODENOSCOPY (EGD) WITH PROPOFOL: SHX5813

## 2020-11-27 HISTORY — PX: GIVENS CAPSULE STUDY: SHX5432

## 2020-11-27 HISTORY — PX: COLONOSCOPY WITH PROPOFOL: SHX5780

## 2020-11-27 LAB — TYPE AND SCREEN
ABO/RH(D): AB POS
Antibody Screen: NEGATIVE
Unit division: 0

## 2020-11-27 LAB — BASIC METABOLIC PANEL
Anion gap: 8 (ref 5–15)
BUN: 16 mg/dL (ref 8–23)
CO2: 22 mmol/L (ref 22–32)
Calcium: 8.4 mg/dL — ABNORMAL LOW (ref 8.9–10.3)
Chloride: 110 mmol/L (ref 98–111)
Creatinine, Ser: 1 mg/dL (ref 0.44–1.00)
GFR, Estimated: 52 mL/min — ABNORMAL LOW (ref >60)
Glucose, Bld: 108 mg/dL — ABNORMAL HIGH (ref 70–99)
Potassium: 3.6 mmol/L (ref 3.5–5.1)
Sodium: 140 mmol/L (ref 135–145)

## 2020-11-27 LAB — GLUCOSE, CAPILLARY
Glucose-Capillary: 114 mg/dL — ABNORMAL HIGH (ref 70–99)
Glucose-Capillary: 111 mg/dL — ABNORMAL HIGH (ref 70–99)
Glucose-Capillary: 112 mg/dL — ABNORMAL HIGH (ref 70–99)
Glucose-Capillary: 150 mg/dL — ABNORMAL HIGH (ref 70–99)
Glucose-Capillary: 107 mg/dL — ABNORMAL HIGH (ref 70–99)

## 2020-11-27 LAB — BPAM RBC
Blood Product Expiration Date: 202204062359
ISSUE DATE / TIME: 202203111535
Unit Type and Rh: 6200

## 2020-11-27 LAB — HEMOGLOBIN A1C
Hgb A1c MFr Bld: 6.4 % — ABNORMAL HIGH (ref 4.8–5.6)
Mean Plasma Glucose: 136.98 mg/dL

## 2020-11-27 LAB — CBC
HCT: 27.2 % — ABNORMAL LOW (ref 36.0–46.0)
Hemoglobin: 8.8 g/dL — ABNORMAL LOW (ref 12.0–15.0)
MCH: 27.9 pg (ref 26.0–34.0)
MCHC: 32.4 g/dL (ref 30.0–36.0)
MCV: 86.3 fL (ref 80.0–100.0)
Platelets: 201 10*3/uL (ref 150–400)
RBC: 3.15 MIL/uL — ABNORMAL LOW (ref 3.87–5.11)
RDW: 14.8 % (ref 11.5–15.5)
WBC: 6.2 10*3/uL (ref 4.0–10.5)
nRBC: 0 % (ref 0.0–0.2)

## 2020-11-27 SURGERY — ESOPHAGOGASTRODUODENOSCOPY (EGD) WITH PROPOFOL
Anesthesia: General

## 2020-11-27 MED ORDER — LIDOCAINE HCL (PF) 2 % IJ SOLN
INTRAMUSCULAR | Status: AC
  Filled 2020-11-27: qty 5

## 2020-11-27 MED ORDER — PANTOPRAZOLE SODIUM 40 MG PO TBEC
40.0000 mg | DELAYED_RELEASE_TABLET | Freq: Every day | ORAL | Status: DC
  Administered 2020-11-27 – 2020-11-30 (×4): 40 mg via ORAL
  Filled 2020-11-27 (×4): qty 1

## 2020-11-27 MED ORDER — VITAMIN D 25 MCG (1000 UNIT) PO TABS
1000.0000 [IU] | ORAL_TABLET | Freq: Every day | ORAL | Status: DC
  Administered 2020-11-27 – 2020-11-30 (×4): 1000 [IU] via ORAL
  Filled 2020-11-27 (×4): qty 1

## 2020-11-27 MED ORDER — ALPRAZOLAM 0.5 MG PO TABS
0.5000 mg | ORAL_TABLET | Freq: Every evening | ORAL | Status: DC | PRN
  Administered 2020-11-27 – 2020-11-29 (×4): 0.5 mg via ORAL
  Filled 2020-11-27 (×4): qty 1

## 2020-11-27 MED ORDER — ONDANSETRON HCL 4 MG PO TABS: 4.0000 mg | ORAL_TABLET | Freq: Four times a day (QID) | ORAL | Status: DC | PRN

## 2020-11-27 MED ORDER — LACTATED RINGERS IV SOLN: INTRAVENOUS | Status: DC | PRN

## 2020-11-27 MED ORDER — PHENYLEPHRINE 40 MCG/ML (10ML) SYRINGE FOR IV PUSH (FOR BLOOD PRESSURE SUPPORT)
PREFILLED_SYRINGE | INTRAVENOUS | Status: DC | PRN
  Administered 2020-11-27 (×2): 40 ug via INTRAVENOUS

## 2020-11-27 MED ORDER — PROPOFOL 500 MG/50ML IV EMUL
INTRAVENOUS | Status: DC | PRN
  Administered 2020-11-27: 150 ug/kg/min via INTRAVENOUS

## 2020-11-27 MED ORDER — MECLIZINE HCL 12.5 MG PO TABS: 12.5000 mg | ORAL_TABLET | Freq: Three times a day (TID) | ORAL | Status: DC | PRN

## 2020-11-27 MED ORDER — ACETAMINOPHEN 325 MG PO TABS: 650.0000 mg | ORAL_TABLET | Freq: Four times a day (QID) | ORAL | Status: DC | PRN

## 2020-11-27 MED ORDER — PROPOFOL 10 MG/ML IV BOLUS
INTRAVENOUS | Status: AC
  Filled 2020-11-27: qty 40

## 2020-11-27 MED ORDER — LIDOCAINE HCL (CARDIAC) PF 100 MG/5ML IV SOSY
PREFILLED_SYRINGE | INTRAVENOUS | Status: DC | PRN
  Administered 2020-11-27: 40 mg via INTRAVENOUS

## 2020-11-27 MED ORDER — PROPOFOL 10 MG/ML IV BOLUS
INTRAVENOUS | Status: DC | PRN
  Administered 2020-11-27 (×2): 30 mg via INTRAVENOUS
  Administered 2020-11-27 (×3): 20 mg via INTRAVENOUS

## 2020-11-27 MED ORDER — ACETAMINOPHEN 650 MG RE SUPP: 650.0000 mg | Freq: Four times a day (QID) | RECTAL | Status: DC | PRN

## 2020-11-27 MED ORDER — ONDANSETRON HCL 4 MG/2ML IJ SOLN: 4.0000 mg | Freq: Four times a day (QID) | INTRAMUSCULAR | Status: DC | PRN

## 2020-11-27 MED ORDER — INSULIN ASPART 100 UNIT/ML ~~LOC~~ SOLN
0.0000 [IU] | Freq: Three times a day (TID) | SUBCUTANEOUS | Status: DC
  Administered 2020-11-27: 1 [IU] via SUBCUTANEOUS
  Administered 2020-11-30: 3 [IU] via SUBCUTANEOUS

## 2020-11-27 MED ORDER — STERILE WATER FOR IRRIGATION IR SOLN
Status: DC | PRN
  Administered 2020-11-27: 200 mL

## 2020-11-27 MED ORDER — ATORVASTATIN CALCIUM 40 MG PO TABS
40.0000 mg | ORAL_TABLET | Freq: Every day | ORAL | Status: DC
  Administered 2020-11-27 – 2020-11-29 (×3): 40 mg via ORAL
  Filled 2020-11-27 (×3): qty 1

## 2020-11-27 NOTE — Brief Op Note (Addendum)
11/26/2020 - 11/27/2020  11:11 AM  PATIENT:  Denise Parrish  87 y.o. female  PRE-OPERATIVE DIAGNOSIS:  hematochezia  POST-OPERATIVE DIAGNOSIS:  LOWER:  diverticulosis;two, polyps not addressed; UPPER:  PROCEDURE:  Procedure(s): ESOPHAGOGASTRODUODENOSCOPY (EGD) WITH PROPOFOL (N/A) COLONOSCOPY WITH PROPOFOL (N/A)  SURGEON:  Surgeon(s) and Role:    * Harvel Quale, MD - Primary  Performed EGD and colonoscopy 3 under propofol sedation.  Patient tolerated the procedures well.  Colonoscopy showed presence of diverticulosis in the sigmoid and descending colon without presence of stigmata of recent bleeding or hematin.  There was only presence of 3 clots in the lumen but they were not adhered to the wall.  There were 2 polyps between 2 to 4 mm in size in the cecum and ascending colon which were sessile but they were not removed but the patient was on Plavix.  Inspection of the terminal ileum up to 25 cm from the ileocecal valve and the rest of the colon did not show any other alterations, normal retroflexion.  Esophagogastroduodenospy showed findings suggestive of short segment Barrett's esophagus (2 cm in max length).  No biopsies were taken.  Stomach and small bowel were normal.  Capsule endoscopy was deployed in the small bowel.  Recommendations: - Return patient to hospital ward for ongoing care.  - Can have crackers in 4 hours after procedure. - Return patient to hospital ward for ongoing care.  - Resume previous diet.  - Capsule endoscopy will be read tomorrow. - Check H/H every day. - Pantoprazole 20 mg qday indefinitely.  Maylon Peppers, MD Gastroenterology and Hepatology [facility]

## 2020-11-27 NOTE — Progress Notes (Signed)
PROGRESS NOTE  Denise Parrish SFK:[id number] DOB: 15-Feb-1923 DOA: 11/26/2020 PCP: [name], MD  Brief History:  87 y.o. female with medical history of aortic stenosis status post TAVR 09/28/2020, coronary artery disease status post DES to left circumflex 09/08/2020, diabetes mellitus type 2, hypertension, hyperlipidemia, iron deficiency anemia, stroke, GI bleed, mild cognitive impairment, and diastolic CHF presenting with 3-day history of maroon stools progressing to melanotic stools.  The patient denies any fevers, chills, chest pain, shortness breath, dizziness, nausea, vomiting, diarrhea, abdominal pain, dysuria, hematuria.  There is no hematemesis.   Notably, the patient was started on Plavix monotherapy after her TAVR on 09/28/2020.  She has continued on Plavix uninterrupted since that time.  Her postoperative course was complicated by dissection of her right femoral artery.  This was repaired by vascular surgery.  She has not had any problems until recently.  However, the patient had a complicated hospital admission from 04/26/2020 to 05/07/2020 for acute blood loss anemia where she had an extensive work-up.  During that hospitalization, the patient underwent EGD which showed no active bleeding and one gastric polyp which was removed.  She underwent a capsule endoscopy which was a limited study as the capsule remained in the stomach for more than 11 hours.  It did not show any active bleeding.  A tagged red blood cell scan on 04/29/2020 was negative for any active bleed.  She was transfused 2 units PRBC at that time. In the emergency department, the patient was afebrile and hemodynamically stable.  BMP showed a sodium 137, potassium 3.7, serum creatinine 1.5.  LFTs were unremarkable.  WBC 6.3, hemoglobin 7.9, platelets 229,000.  Chest x-ray was negative for any acute findings.  EKG shows sinus rhythm with nonspecific ST-T wave changes.  Assessment/Plan: Acute blood loss anemia -GI  consult appreciated -1 unit PRBC ordered -Baseline hemoglobin~9-10 -Presented with hemoglobin 7.9 -11/27/20 EGD--short segment Barrett's -11/27/20 colonoscopy--2 polyps ascending colon; sigmoid and descending colon diverticulosis -capsule placed in SB by endoscopy 3/12 -Holding Plavix--last dose on morning through 1122 -Start Protonix IV -Clear liquid diet for now -am CBC  CKD stage IIIb -Baseline creatinine 0.9-1.2 -A.m. BMP  Status post TAVR -Patient has continued on Plavix uninterrupted since TAVR on 09/28/2020 -Hold Plavix temporarily  Coronary artery disease -Status post DES to the left circumflex 09/08/2020 -Holding Plavix temporarily  Diabetes mellitus type 2, controlled -04/07/2020 hemoglobin A1c 7.0 -11/26/20 A1c--6.4 -Holding Amaryl  Essential hypertension -Holding metoprolol secondary to soft blood pressure  Hyperlipidemia -Continue statin  Chronic diastolic CHF -Clinically compensated -10/25/2020 echo EF 70-25%, grade 1 DD, mildly elevated PASP, mild MS  Unspecified CVA in January 2021 and TIA in July 2021 -Recent right CEA on 2-21 for symptomatic right carotid artery stenosis -Holding Plavix temporarily  Anxiety disorder -Continue home dose of alprazolam     Status is: Observation  The patient will require care spanning > 2 midnights and should be moved to inpatient because: Ongoing diagnostic testing needed not appropriate for outpatient work up  Dispo: The patient is from: Home              Anticipated d/c is to: Home              Patient currently is not medically stable to d/c.   Difficult to place patient No        Family Communication:   Daughter updated at bedside 3/12  Consultants:  GI  Code Status:  FULL  DVT Prophylaxis:  SCDs   Procedures: As Listed in Progress Note Above  Antibiotics: None     Subjective: No further hematochezia or melena.  Patient denies fevers, chills, headache, chest pain, dyspnea,  nausea, vomiting, diarrhea, abdominal pain, dysuria, hematuria   Objective: Vitals:   11/27/20 0955 11/27/20 1134 11/27/20 1145 11/27/20 1215  BP: (!) 161/62 (!) 156/66 (!) 157/60 (!) 152/73  Pulse: 90 82 73 60  Resp: 19 17 (!) 23 16  Temp: 98.8 F (37.1 C) 97.6 F (36.4 C)  97.6 F (36.4 C)  TempSrc: Oral     SpO2: 99% 100% 100% 98%  Weight:      Height:        Intake/Output Summary (Last 24 hours) at 11/27/2020 1713 Last data filed at 11/27/2020 1127 Gross per 24 hour  Intake 1468.01 ml  Output 652 ml  Net 816.01 ml   Weight change:  Exam:   General:  Pt is alert, follows commands appropriately, not in acute distress  HEENT: No icterus, No thrush, No neck mass, /AT  Cardiovascular: RRR, S1/S2, no rubs, no gallops  Respiratory: CTA bilaterally, no wheezing, no crackles, no rhonchi  Abdomen: Soft/+BS, non tender, non distended, no guarding  Extremities: No edema, No lymphangitis, No petechiae, No rashes, no synovitis   Data Reviewed: I have personally reviewed following labs and imaging studies Basic Metabolic Panel: Recent Labs  Lab 11/26/20 1154 11/27/20 0538  NA 137 140  K 3.7 3.6  CL 103 110  CO2 22 22  GLUCOSE 209* 108*  BUN 22 16  CREATININE 1.15* 1.00  CALCIUM 8.8* 8.4*   Liver Function Tests: Recent Labs  Lab 11/26/20 1154  AST 35  ALT 28  ALKPHOS 65  BILITOT 0.9  PROT 6.3*  ALBUMIN 3.4*   No results for input(s): LIPASE, AMYLASE in the last 168 hours. No results for input(s): AMMONIA in the last 168 hours. Coagulation Profile: No results for input(s): INR, PROTIME in the last 168 hours. CBC: Recent Labs  Lab 11/26/20 1154 11/27/20 0538  WBC 6.3 6.2  NEUTROABS 4.0  --   HGB 7.9* 8.8*  HCT 25.6* 27.2*  MCV 87.1 86.3  PLT 229 201   Cardiac Enzymes: No results for input(s): CKTOTAL, CKMB, CKMBINDEX, TROPONINI in the last 168 hours. BNP: Invalid input(s): POCBNP CBG: Recent Labs  Lab 11/27/20 0714 11/27/20 1025  11/27/20 1216 11/27/20 1545  GLUCAP 114* 111* 112* 150*   HbA1C: Recent Labs    11/27/20 0538  HGBA1C 6.4*   Urine analysis:    Component Value Date/Time   COLORURINE STRAW (A) 09/30/2020 0724   APPEARANCEUR CLEAR 09/30/2020 0724   LABSPEC 1.009 09/30/2020 0724   PHURINE 5.0 09/30/2020 0724   GLUCOSEU NEGATIVE 09/30/2020 0724   HGBUR SMALL (A) 09/30/2020 0724   BILIRUBINUR NEGATIVE 09/30/2020 0724   KETONESUR NEGATIVE 09/30/2020 0724   PROTEINUR NEGATIVE 09/30/2020 0724   UROBILINOGEN 1.0 05/23/2015 1956   NITRITE NEGATIVE 09/30/2020 0724   LEUKOCYTESUR NEGATIVE 09/30/2020 0724   Sepsis Labs: @LABRCNTIP (procalcitonin:4,lacticidven:4) ) Recent Results (from the past 240 hour(s))  Resp Panel by RT-PCR (Flu A&B, Covid) Nasopharyngeal Swab     Status: None   Collection Time: 11/26/20  2:02 PM   Specimen: Nasopharyngeal Swab; Nasopharyngeal(NP) swabs in vial transport medium  Result Value Ref Range Status   SARS Coronavirus 2 by RT PCR NEGATIVE NEGATIVE Final    Comment: (NOTE) SARS-CoV-2 target nucleic acids are NOT DETECTED.  The SARS-CoV-2 RNA is generally detectable in  upper respiratory specimens during the acute phase of infection. The lowest concentration of SARS-CoV-2 viral copies this assay can detect is 138 copies/mL. A negative result does not preclude SARS-Cov-2 infection and should not be used as the sole basis for treatment or other patient management decisions. A negative result may occur with  improper specimen collection/handling, submission of specimen other than nasopharyngeal swab, presence of viral mutation(s) within the areas targeted by this assay, and inadequate number of viral copies(<138 copies/mL). A negative result must be combined with clinical observations, patient history, and epidemiological information. The expected result is Negative.  Fact Sheet for Patients:  EntrepreneurPulse.com.au  Fact Sheet for Healthcare  Providers:  IncredibleEmployment.be  This test is no t yet approved or cleared by the [place] FDA and  has been authorized for detection and/or diagnosis of SARS-CoV-2 by FDA under an Emergency Use Authorization (EUA). This EUA will remain  in effect (meaning this test can be used) for the duration of the COVID-19 declaration under Section 564(b)(1) of the Act, 21 U.S.C.section 360bbb-3(b)(1), unless the authorization is terminated  or revoked sooner.       Influenza A by PCR NEGATIVE NEGATIVE Final   Influenza B by PCR NEGATIVE NEGATIVE Final    Comment: (NOTE) The Xpert Xpress SARS-CoV-2/FLU/RSV plus assay is intended as an aid in the diagnosis of influenza from Nasopharyngeal swab specimens and should not be used as a sole basis for treatment. Nasal washings and aspirates are unacceptable for Xpert Xpress SARS-CoV-2/FLU/RSV testing.  Fact Sheet for Patients: EntrepreneurPulse.com.au  Fact Sheet for Healthcare Providers: IncredibleEmployment.be  This test is not yet approved or cleared by the [place] FDA and has been authorized for detection and/or diagnosis of SARS-CoV-2 by FDA under an Emergency Use Authorization (EUA). This EUA will remain in effect (meaning this test can be used) for the duration of the COVID-19 declaration under Section 564(b)(1) of the Act, 21 U.S.C. section 360bbb-3(b)(1), unless the authorization is terminated or revoked.  Performed at [facility], [address] [place], [place] [address]      Scheduled Meds: . atorvastatin  40 mg Oral q1800  . cholecalciferol  1,000 Units Oral Daily  . insulin aspart  0-9 Units Subcutaneous TID WC  . pantoprazole  40 mg Oral Daily   Continuous Infusions: . sodium chloride 50 mL/hr at 11/26/20 1841  . sodium chloride      Procedures/Studies: DG Chest Port 1 View  Result Date: 11/26/2020 CLINICAL DATA:  Chest pain.  Gastrointestinal  bleeding EXAM: PORTABLE CHEST 1 VIEW COMPARISON:  September 24, 2020 FINDINGS: No edema or airspace opacity. Heart size and pulmonary vascular normal. No adenopathy. Aortic valve replacement present. There is aortic atherosclerosis. There is postoperative change in the lower cervical spine. Bones are osteoporotic. IMPRESSION: No edema or airspace opacity. Heart size within normal limits. Status post aortic valve replacement. Aortic Atherosclerosis (ICD10-I70.0). Electronically Signed   By: Lowella Grip III M.D.   On: 11/26/2020 14:30    Orson Eva, DO  [facility]  If 7PM-7AM, please contact night-coverage [email or web address] Password [facility] 11/27/2020, 5:13 PM   LOS: 0 days

## 2020-11-27 NOTE — Op Note (Addendum)
[facility] Patient Name: Denise Parrish Procedure Date: 11/27/2020 11:07 AM MRN: [id number] Date of Birth: 1922-09-20 Attending MD: Maylon Peppers ,  CSN: [id number] Age: 87 Admit Type: Outpatient Procedure:                Upper GI endoscopy Indications:              Hematochezia Providers:                Maylon Peppers, Lurline Del, RN, Casimer Bilis, Technician Referring MD:              Medicines:                Monitored Anesthesia Care Complications:            No immediate complications. Estimated Blood Loss:     Estimated blood loss: none. Procedure:                Pre-Anesthesia Assessment:                           - Prior to the procedure, a History and Physical                            was performed, and patient medications, allergies                            and sensitivities were reviewed. The patient's                            tolerance of previous anesthesia was reviewed.                           - The risks and benefits of the procedure and the                            sedation options and risks were discussed with the                            patient. All questions were answered and informed                            consent was obtained.                           - ASA Grade Assessment: III - A patient with severe                            systemic disease.                           After obtaining informed consent, the endoscope was                            passed under direct vision. Throughout the  procedure, the patient's blood pressure, pulse, and                            oxygen saturations were monitored continuously. The                            [id number]-[id number] ([id number]) scope was introduced through the                            mouth, and advanced to the third part of duodenum.                            The upper GI endoscopy was accomplished without                             difficulty. The patient tolerated the procedure                            well. Scope In: 11:12:42 AM Scope Out: 11:22:05 AM Total Procedure Duration: 0 hours 9 minutes 23 seconds  Findings:      The esophagus and gastroesophageal junction were examined with white       light and narrow band imaging (NBI). There were esophageal mucosal       changes suspicious for short-segment Barrett's esophagus, classified as       Barrett's stage C1-M2 per Prague criteria. These changes involved the       mucosa at the upper extent of the gastric folds (41 cm from the       incisors) extending to the Z-line (39 cm from the incisors).       Circumferential salmon-colored mucosa was present. The maximum       longitudinal extent of these esophageal mucosal changes was 2 cm in       length.      The entire examined stomach was normal.      The examined duodenum was normal. An endoscopic capsule was attached to       the tip of the scope and was deployed directly in the duodenum. Impression:               - Esophageal mucosal changes suspicious for                            short-segment Barrett's esophagus, classified as                            Barrett's stage C1-M2 per Prague criteria.                           - Normal stomach.                           - Normal examined duodenum. Capsule endoscopy                            deployed.                           -  No specimens collected. Moderate Sedation:      Per Anesthesia Care Recommendation:           - Return patient to hospital ward for ongoing care.                           - Resume previous diet.                           - Capsule endoscopy will be read tomorrow.                           - Check H/H every day.                           - Continue pantoprazole 20 mg qday PO. Procedure Code(s):        --- Professional ---                           [phone], Esophagogastroduodenoscopy, flexible,                            transoral;  diagnostic, including collection of                            specimen(s) by brushing or washing, when performed                            (separate procedure) Diagnosis Code(s):        --- Professional ---                           K92.1, Melena (includes Hematochezia) CPT copyright 2019 American Medical Association. All rights reserved. The codes documented in this report are preliminary and upon coder review may  be revised to meet current compliance requirements. Maylon Peppers, MD Maylon Peppers,  11/27/2020 11:31:51 AM This report has been signed electronically. Number of Addenda: 0

## 2020-11-27 NOTE — Anesthesia Preprocedure Evaluation (Signed)
Anesthesia Evaluation  Patient identified by MRN, date of birth, ID band Patient awake    Reviewed: Allergy & Precautions, NPO status , Patient's Chart, lab work & pertinent test results, reviewed documented beta blocker date and time   History of Anesthesia Complications Negative for: history of anesthetic complications  Airway Mallampati: II      Comment: Neck sx Dental  (+) Edentulous Upper, Edentulous Lower   Pulmonary pneumonia, resolved, COPD,    Pulmonary exam normal breath sounds clear to auscultation       Cardiovascular Exercise Tolerance: Good hypertension, Pt. on medications + CAD and + Cardiac Stents (09/08/20)  + Valvular Problems/Murmurs (TAVR -09/28/20) AS  Rhythm:Regular Rate:Normal + Systolic murmurs    Neuro/Psych Anxiety CVA    GI/Hepatic Neg liver ROS, hiatal hernia, GERD  Medicated and Controlled,  Endo/Other  diabetes, Well Controlled, Type 2, Oral Hypoglycemic Agents  Renal/GU negative Renal ROS     Musculoskeletal  (+) Arthritis  (neck sx), Osteoarthritis,    Abdominal   Peds  Hematology  (+) anemia ,   Anesthesia Other Findings   Reproductive/Obstetrics                             Anesthesia Physical Anesthesia Plan  ASA: IV  Anesthesia Plan: General   Post-op Pain Management:    Induction: Intravenous  PONV Risk Score and Plan: TIVA and Propofol infusion  Airway Management Planned: Nasal Cannula and Natural Airway  Additional Equipment:   Intra-op Plan:   Post-operative Plan:   Informed Consent: I have reviewed the patients History and Physical, chart, labs and discussed the procedure including the risks, benefits and alternatives for the proposed anesthesia with the patient or authorized representative who has indicated his/her understanding and acceptance.       Plan Discussed with: Surgeon  Anesthesia Plan Comments:          Anesthesia Quick Evaluation

## 2020-11-27 NOTE — Transfer of Care (Signed)
Immediate Anesthesia Transfer of Care Note  Patient: Denise Parrish  Procedure(s) Performed: ESOPHAGOGASTRODUODENOSCOPY (EGD) WITH PROPOFOL (N/A ) COLONOSCOPY WITH PROPOFOL (N/A ) GIVENS CAPSULE STUDY  Patient Location: PACU  Anesthesia Type:General  Level of Consciousness: awake, alert , oriented and sedated  Airway & Oxygen Therapy: Patient Spontanous Breathing and Patient connected to nasal cannula oxygen  Post-op Assessment: Report given to RN, Post -op Vital signs reviewed and stable and Patient moving all extremities X 4  Post vital signs: Reviewed and stable  Last Vitals:  Vitals Value Taken Time  BP 156/66 11/27/20 1136  Temp 97.6 11/27/20  Pulse 68 11/27/20 1138  Resp 13 11/27/20 1138  SpO2 100 % 11/27/20 1138  Vitals shown include unvalidated device data.  Last Pain:  Vitals:   11/27/20 0955  TempSrc: Oral  PainSc: 0-No pain         Complications: No complications documented.

## 2020-11-27 NOTE — Anesthesia Postprocedure Evaluation (Signed)
Anesthesia Post Note  Patient: Denise Parrish  Procedure(s) Performed: ESOPHAGOGASTRODUODENOSCOPY (EGD) WITH PROPOFOL (N/A ) COLONOSCOPY WITH PROPOFOL (N/A ) GIVENS CAPSULE STUDY  Patient location during evaluation: PACU Anesthesia Type: General Level of consciousness: awake and alert, oriented and sedated Pain management: pain level controlled Vital Signs Assessment: post-procedure vital signs reviewed and stable Respiratory status: spontaneous breathing, respiratory function stable and patient connected to nasal cannula oxygen Cardiovascular status: blood pressure returned to baseline and stable Postop Assessment: no apparent nausea or vomiting Anesthetic complications: no   No complications documented.   Last Vitals:  Vitals:   11/27/20 0955 11/27/20 1134  BP: (!) 161/62 (!) 156/66  Pulse: 90 82  Resp: 19 17  Temp: 37.1 C 36.4 C  SpO2: 99% 100%    Last Pain:  Vitals:   11/27/20 1134  TempSrc:   PainSc: 0-No pain                 Rajamani C Battula

## 2020-11-27 NOTE — Care Management Obs Status (Signed)
[facility] NOTIFICATION   Patient Details  Name: Denise Parrish MRN: [id number] Date of Birth: 11/02/1922   Medicare Observation Status Notification Given:  Yes    Natasha Bence, LCSW 11/27/2020, 6:04 PM

## 2020-11-27 NOTE — Op Note (Signed)
[facility] Patient Name: Denise Parrish Procedure Date: 11/27/2020 10:23 AM MRN: [id number] Date of Birth: [birth year] Attending MD: Maylon Peppers ,  CSN: [id number] Age: 87 Admit Type: Outpatient Procedure:                Colonoscopy Indications:              Hematochezia Providers:                Maylon Peppers, Lurline Del, RN, Casimer Bilis, Technician Referring MD:              Medicines:                Monitored Anesthesia Care Complications:            No immediate complications. Estimated Blood Loss:     Estimated blood loss: none. Procedure:                After obtaining informed consent, the colonoscope                            was passed under direct vision. Throughout the                            procedure, the patient's blood pressure, pulse, and                            oxygen saturations were monitored continuously. The                            [id number]-[id number] ([id number]) scope was introduced through                            the anus and advanced to the 25 cm into the ileum.                            The colonoscopy was performed without difficulty.                            The patient tolerated the procedure well. The                            quality of the bowel preparation was good. Scope                            withdrawal time was 18 minutes. Scope In: 10:42:53 AM Scope Out: 11:04:54 AM Scope Withdrawal Time: 0 hours 13 minutes 50 seconds  Total Procedure Duration: 0 hours 22 minutes 1 second  Findings:      The perianal and digital rectal examinations were normal.      The distal ileum appeared normal.      Two sessile polyps were found in the ascending colon and cecum. The       polyps were 2 to 4 mm in size. These polyps were not removed as th       patient  is on Plavix.      Multiple small and large-mouthed diverticula were found in the sigmoid       colon and descending colon. There were three clots  in the lumen of the       colon but none were attached to the diverticula. No stigmata of bleeding       or hematin was observed upon careful inspection.      The retroflexed view of the distal rectum and anal verge was normal and       showed no anal or rectal abnormalities. Impression:               - The examined portion of the ileum was normal.                           - Two 2 to 4 mm polyps in the ascending colon and                            in the cecum.                           - Diverticulosis in the sigmoid colon and in the                            descending colon.                           - The distal rectum and anal verge are normal on                            retroflexion view.                           - No specimens collected. Moderate Sedation:      Per Anesthesia Care Recommendation:           - Return patient to hospital ward for ongoing care.                           - Resume previous diet.                           - Repeat colonoscopy is not recommended due to                            current age (26 years or older) for screening                            purposes.                           - Proceed with EGD with possible deployment of                            capsule endoscopy. Procedure Code(s):        --- Professional ---  45378, Colonoscopy, flexible; diagnostic, including                            collection of specimen(s) by brushing or washing,                            when performed (separate procedure) Diagnosis Code(s):        --- Professional ---                           K63.5, Polyp of colon                           K92.1, Melena (includes Hematochezia)                           K57.30, Diverticulosis of large intestine without                            perforation or abscess without bleeding CPT copyright 2019 American Medical Association. All rights reserved. The codes documented in this report are  preliminary and upon coder review may  be revised to meet current compliance requirements. Maylon Peppers, MD Maylon Peppers,  11/27/2020 11:11:17 AM This report has been signed electronically. Number of Addenda: 0

## 2020-11-28 ENCOUNTER — Observation Stay (HOSPITAL_COMMUNITY): Payer: Medicare Other

## 2020-11-28 DIAGNOSIS — Z955 Presence of coronary angioplasty implant and graft: Secondary | ICD-10-CM | POA: Diagnosis not present

## 2020-11-28 DIAGNOSIS — N1832 Chronic kidney disease, stage 3b: Secondary | ICD-10-CM | POA: Diagnosis not present

## 2020-11-28 DIAGNOSIS — K922 Gastrointestinal hemorrhage, unspecified: Secondary | ICD-10-CM | POA: Diagnosis not present

## 2020-11-28 DIAGNOSIS — F419 Anxiety disorder, unspecified: Secondary | ICD-10-CM | POA: Diagnosis not present

## 2020-11-28 DIAGNOSIS — Z7902 Long term (current) use of antithrombotics/antiplatelets: Secondary | ICD-10-CM | POA: Diagnosis not present

## 2020-11-28 DIAGNOSIS — I5032 Chronic diastolic (congestive) heart failure: Secondary | ICD-10-CM | POA: Diagnosis not present

## 2020-11-28 DIAGNOSIS — D62 Acute posthemorrhagic anemia: Secondary | ICD-10-CM | POA: Diagnosis present

## 2020-11-28 DIAGNOSIS — E785 Hyperlipidemia, unspecified: Secondary | ICD-10-CM | POA: Diagnosis not present

## 2020-11-28 DIAGNOSIS — I2510 Atherosclerotic heart disease of native coronary artery without angina pectoris: Secondary | ICD-10-CM | POA: Diagnosis not present

## 2020-11-28 DIAGNOSIS — Z7984 Long term (current) use of oral hypoglycemic drugs: Secondary | ICD-10-CM | POA: Diagnosis not present

## 2020-11-28 DIAGNOSIS — Z952 Presence of prosthetic heart valve: Secondary | ICD-10-CM | POA: Diagnosis not present

## 2020-11-28 DIAGNOSIS — Z20822 Contact with and (suspected) exposure to covid-19: Secondary | ICD-10-CM | POA: Diagnosis not present

## 2020-11-28 DIAGNOSIS — Z8249 Family history of ischemic heart disease and other diseases of the circulatory system: Secondary | ICD-10-CM | POA: Diagnosis not present

## 2020-11-28 DIAGNOSIS — I10 Essential (primary) hypertension: Secondary | ICD-10-CM | POA: Diagnosis not present

## 2020-11-28 DIAGNOSIS — Z8673 Personal history of transient ischemic attack (TIA), and cerebral infarction without residual deficits: Secondary | ICD-10-CM | POA: Diagnosis not present

## 2020-11-28 DIAGNOSIS — Z961 Presence of intraocular lens: Secondary | ICD-10-CM | POA: Diagnosis present

## 2020-11-28 DIAGNOSIS — Z953 Presence of xenogenic heart valve: Secondary | ICD-10-CM | POA: Diagnosis not present

## 2020-11-28 DIAGNOSIS — Z8049 Family history of malignant neoplasm of other genital organs: Secondary | ICD-10-CM | POA: Diagnosis not present

## 2020-11-28 DIAGNOSIS — K6381 Dieulafoy lesion of intestine: Secondary | ICD-10-CM | POA: Diagnosis not present

## 2020-11-28 DIAGNOSIS — Z833 Family history of diabetes mellitus: Secondary | ICD-10-CM | POA: Diagnosis not present

## 2020-11-28 DIAGNOSIS — K921 Melena: Secondary | ICD-10-CM | POA: Diagnosis not present

## 2020-11-28 DIAGNOSIS — E1140 Type 2 diabetes mellitus with diabetic neuropathy, unspecified: Secondary | ICD-10-CM | POA: Diagnosis present

## 2020-11-28 DIAGNOSIS — E1122 Type 2 diabetes mellitus with diabetic chronic kidney disease: Secondary | ICD-10-CM | POA: Diagnosis not present

## 2020-11-28 DIAGNOSIS — I6521 Occlusion and stenosis of right carotid artery: Secondary | ICD-10-CM | POA: Diagnosis present

## 2020-11-28 DIAGNOSIS — I130 Hypertensive heart and chronic kidney disease with heart failure and stage 1 through stage 4 chronic kidney disease, or unspecified chronic kidney disease: Secondary | ICD-10-CM | POA: Diagnosis not present

## 2020-11-28 DIAGNOSIS — J439 Emphysema, unspecified: Secondary | ICD-10-CM | POA: Diagnosis present

## 2020-11-28 DIAGNOSIS — Z9842 Cataract extraction status, left eye: Secondary | ICD-10-CM | POA: Diagnosis not present

## 2020-11-28 LAB — CBC
HCT: 27.3 % — ABNORMAL LOW (ref 36.0–46.0)
Hemoglobin: 8.6 g/dL — ABNORMAL LOW (ref 12.0–15.0)
MCH: 27.7 pg (ref 26.0–34.0)
MCHC: 31.5 g/dL (ref 30.0–36.0)
MCV: 87.8 fL (ref 80.0–100.0)
Platelets: 208 10*3/uL (ref 150–400)
RBC: 3.11 MIL/uL — ABNORMAL LOW (ref 3.87–5.11)
RDW: 15.3 % (ref 11.5–15.5)
WBC: 7.9 10*3/uL (ref 4.0–10.5)
nRBC: 0 % (ref 0.0–0.2)

## 2020-11-28 LAB — GLUCOSE, CAPILLARY
Glucose-Capillary: 101 mg/dL — ABNORMAL HIGH (ref 70–99)
Glucose-Capillary: 118 mg/dL — ABNORMAL HIGH (ref 70–99)
Glucose-Capillary: 97 mg/dL (ref 70–99)
Glucose-Capillary: 129 mg/dL — ABNORMAL HIGH (ref 70–99)

## 2020-11-28 LAB — BASIC METABOLIC PANEL
Anion gap: 9 (ref 5–15)
BUN: 9 mg/dL (ref 8–23)
CO2: 23 mmol/L (ref 22–32)
Calcium: 8.4 mg/dL — ABNORMAL LOW (ref 8.9–10.3)
Chloride: 108 mmol/L (ref 98–111)
Creatinine, Ser: 0.92 mg/dL (ref 0.44–1.00)
GFR, Estimated: 57 mL/min — ABNORMAL LOW (ref >60)
Glucose, Bld: 110 mg/dL — ABNORMAL HIGH (ref 70–99)
Potassium: 3.7 mmol/L (ref 3.5–5.1)
Sodium: 140 mmol/L (ref 135–145)

## 2020-11-28 MED ORDER — IOHEXOL 350 MG/ML SOLN
80.0000 mL | Freq: Once | INTRAVENOUS | Status: AC | PRN
  Administered 2020-11-28: 80 mL via INTRAVENOUS

## 2020-11-28 NOTE — Progress Notes (Signed)
PROGRESS NOTE  Denise Parrish QTM:[id number] DOB: [birth year] DOA: 11/26/2020 PCP: [name], MD  Brief History:  87 y.o.femalewith medical history ofaortic stenosis status postTAVR1/07/2021,coronary artery disease status post DES to left circumflex 09/08/2020, diabetes mellitus type 2, hypertension, hyperlipidemia, iron deficiency anemia, stroke, GI bleed, mild cognitive impairment, and diastolic CHF presenting with 3-day history of maroon stools progressing to melanotic stools. The patient denies any fevers, chills, chest pain, shortness breath, dizziness, nausea, vomiting, diarrhea, abdominal pain, dysuria, hematuria. There is no hematemesis.  Notably, the patient was started on Plavix monotherapy after her TAVR on 09/28/2020. She has continued on Plavix uninterrupted since that time. Her postoperative course was complicated by dissection of her right femoral artery. This was repaired by vascular surgery. She has not had any problems until recently. However, the patient had a complicated hospital admission from 04/26/2020 to 05/07/2020 for acute blood loss anemia where she had an extensive work-up. During that hospitalization, the patient underwent EGD which showed noactive bleeding and one gastric polyp which was removed. She underwent a capsule endoscopy which was a limited study as the capsule remained in the stomach for more than 11 hours. It did not show any active bleeding. A tagged red blood cell scan on 04/29/2020 was negative for any active bleed. She was transfused 2 units PRBC at that time. In the emergency department, the patient was afebrile and hemodynamically stable. BMP showed a sodium 137, potassium 3.7, serum creatinine 1.5. LFTs were unremarkable. WBC 6.3, hemoglobin 7.9, platelets 229,000. Chest x-ray was negative for any acute findings. EKG shows sinus rhythm with nonspecific ST-T wave changes.  Assessment/Plan: Acute blood loss anemia -GI  consult appreciated -1 unit PRBC ordered -Baseline hemoglobin~9-10 -Presented with hemoglobin 7.9 -11/27/20 EGD--short segment Barrett's -11/27/20 colonoscopy--2 polyps ascending colon; sigmoid and descending colon diverticulosis -capsule placed in SB by endoscopy 3/12--> evidence of a speck of fresh blood and a questionable nodule/small mass (?Dieulafoy lesion) in the distal small bowel.  This nodule did not look ulcerated.  Also, there was presence of hematochezia throughout the colon but no fresh blood was observed -Holding Plavix--last dose on morning 11/26/20 -CTA abd ordered -Continue Protonix  -Clear liquid diet for now -am CBC -she remains very high risk of rebleed; needs further investigation to clarify source during inpatient stay due to co-morbidities and risk of adverse outcome  CKD stage IIIb -Baseline creatinine 0.9-1.2 -A.m. BMP  Status post TAVR -Patient has continued on Plavix uninterrupted since TAVR on 09/28/2020 -Hold Plavix temporarily  Coronary artery disease -Status post DES to the left circumflex 09/08/2020 -Holding Plavix temporarily  Diabetes mellitus type 2, controlled -04/07/2020 hemoglobin A1c 7.0 -11/26/20 A1c--6.4 -Holding Amaryl -CBGs controlled  Essential hypertension -Holding metoprolol secondary to soft blood pressure -soft BPs controlled  Hyperlipidemia -Continue statin  Chronic diastolic CHF -Clinically compensated -10/25/2020 echo EF 70-25%, grade 1 DD, mildly elevated PASP, mild MS  Unspecified CVA in January 2021 and TIA in July 2021 -Recent right CEA on 2-21 for symptomatic right carotid artery stenosis -Holding Plavix temporarily  Anxiety disorder -Continue home dose of alprazolam     Status is: Observation  The patient will require care spanning > 2 midnights and should be moved to inpatient because: Ongoing diagnostic testing needed not appropriate for outpatient work up  Dispo: The patient is from:  Home  Anticipated d/c is to: Home  Patient currently is not medically stable to d/c.  Difficult to place patient No        Family Communication:   Daughter updated at bedside 3/13  Consultants:  GI  Code Status:  FULL   DVT Prophylaxis:  SCDs   Procedures: As Listed in Progress Note Above  Antibiotics: None     Subjective:   Objective: Vitals:   11/27/20 1145 11/27/20 1215 11/27/20 2128 11/28/20 0603  BP: (!) 157/60 (!) 152/73 (!) 162/69 (!) 124/42  Pulse: 73 60 73 73  Resp: (!) 23 16 16 16   Temp:  97.6 F (36.4 C) 98.5 F (36.9 C) 98.6 F (37 C)  TempSrc:   Oral Oral  SpO2: 100% 98% 98% 97%  Weight:      Height:       No intake or output data in the 24 hours ending 11/28/20 1357 Weight change:  Exam:   General:  Pt is alert, follows commands appropriately, not in acute distress  HEENT: No icterus, No thrush, No neck mass, [place]/AT  Cardiovascular: RRR, S1/S2, no rubs, no gallops  Respiratory: CTA bilaterally, no wheezing, no crackles, no rhonchi  Abdomen: Soft/+BS, non tender, non distended, no guarding  Extremities: No edema, No lymphangitis, No petechiae, No rashes, no synovitis   Data Reviewed: I have personally reviewed following labs and imaging studies Basic Metabolic Panel: Recent Labs  Lab 11/26/20 1154 11/27/20 0538 11/28/20 0526  NA 137 140 140  K 3.7 3.6 3.7  CL 103 110 108  CO2 22 22 23   GLUCOSE 209* 108* 110*  BUN 22 16 9   CREATININE 1.15* 1.00 0.92  CALCIUM 8.8* 8.4* 8.4*   Liver Function Tests: Recent Labs  Lab 11/26/20 1154  AST 35  ALT 28  ALKPHOS 65  BILITOT 0.9  PROT 6.3*  ALBUMIN 3.4*   No results for input(s): LIPASE, AMYLASE in the last 168 hours. No results for input(s): AMMONIA in the last 168 hours. Coagulation Profile: No results for input(s): INR, PROTIME in the last 168 hours. CBC: Recent Labs  Lab 11/26/20 1154 11/27/20 0538 11/28/20 0526   WBC 6.3 6.2 7.9  NEUTROABS 4.0  --   --   HGB 7.9* 8.8* 8.6*  HCT 25.6* 27.2* 27.3*  MCV 87.1 86.3 87.8  PLT 229 201 208   Cardiac Enzymes: No results for input(s): CKTOTAL, CKMB, CKMBINDEX, TROPONINI in the last 168 hours. BNP: Invalid input(s): POCBNP CBG: Recent Labs  Lab 11/27/20 1216 11/27/20 1545 11/27/20 2125 11/28/20 0726 11/28/20 1135  GLUCAP 112* 150* 107* 101* 118*   HbA1C: Recent Labs    11/27/20 0538  HGBA1C 6.4*   Urine analysis:    Component Value Date/Time   COLORURINE STRAW (A) 09/30/2020 0724   APPEARANCEUR CLEAR 09/30/2020 0724   LABSPEC 1.009 09/30/2020 0724   PHURINE 5.0 09/30/2020 0724   GLUCOSEU NEGATIVE 09/30/2020 0724   HGBUR SMALL (A) 09/30/2020 0724   BILIRUBINUR NEGATIVE 09/30/2020 0724   KETONESUR NEGATIVE 09/30/2020 0724   PROTEINUR NEGATIVE 09/30/2020 0724   UROBILINOGEN 1.0 05/23/2015 1956   NITRITE NEGATIVE 09/30/2020 0724   LEUKOCYTESUR NEGATIVE 09/30/2020 0724   Sepsis Labs: @LABRCNTIP (procalcitonin:4,lacticidven:4) ) Recent Results (from the past 240 hour(s))  Resp Panel by RT-PCR (Flu A&B, Covid) Nasopharyngeal Swab     Status: None   Collection Time: 11/26/20  2:02 PM   Specimen: Nasopharyngeal Swab; Nasopharyngeal(NP) swabs in vial transport medium  Result Value Ref Range Status   SARS Coronavirus 2 by RT PCR NEGATIVE NEGATIVE Final    Comment: (NOTE) SARS-CoV-2 target nucleic  acids are NOT DETECTED.  The SARS-CoV-2 RNA is generally detectable in upper respiratory specimens during the acute phase of infection. The lowest concentration of SARS-CoV-2 viral copies this assay can detect is 138 copies/mL. A negative result does not preclude SARS-Cov-2 infection and should not be used as the sole basis for treatment or other patient management decisions. A negative result may occur with  improper specimen collection/handling, submission of specimen other than nasopharyngeal swab, presence of viral mutation(s) within  the areas targeted by this assay, and inadequate number of viral copies(<138 copies/mL). A negative result must be combined with clinical observations, patient history, and epidemiological information. The expected result is Negative.  Fact Sheet for Patients:  EntrepreneurPulse.com.au  Fact Sheet for Healthcare Providers:  IncredibleEmployment.be  This test is no t yet approved or cleared by the [place] FDA and  has been authorized for detection and/or diagnosis of SARS-CoV-2 by FDA under an Emergency Use Authorization (EUA). This EUA will remain  in effect (meaning this test can be used) for the duration of the COVID-19 declaration under Section 564(b)(1) of the Act, 21 U.S.C.section 360bbb-3(b)(1), unless the authorization is terminated  or revoked sooner.       Influenza A by PCR NEGATIVE NEGATIVE Final   Influenza B by PCR NEGATIVE NEGATIVE Final    Comment: (NOTE) The Xpert Xpress SARS-CoV-2/FLU/RSV plus assay is intended as an aid in the diagnosis of influenza from Nasopharyngeal swab specimens and should not be used as a sole basis for treatment. Nasal washings and aspirates are unacceptable for Xpert Xpress SARS-CoV-2/FLU/RSV testing.  Fact Sheet for Patients: EntrepreneurPulse.com.au  Fact Sheet for Healthcare Providers: IncredibleEmployment.be  This test is not yet approved or cleared by the [place] FDA and has been authorized for detection and/or diagnosis of SARS-CoV-2 by FDA under an Emergency Use Authorization (EUA). This EUA will remain in effect (meaning this test can be used) for the duration of the COVID-19 declaration under Section 564(b)(1) of the Act, 21 U.S.C. section 360bbb-3(b)(1), unless the authorization is terminated or revoked.  Performed at [facility], [address] [place], [place] [address]      Scheduled Meds: . atorvastatin  40 mg Oral q1800  .  cholecalciferol  1,000 Units Oral Daily  . insulin aspart  0-9 Units Subcutaneous TID WC  . pantoprazole  40 mg Oral Daily   Continuous Infusions: . sodium chloride 50 mL/hr at 11/26/20 1841  . sodium chloride      Procedures/Studies: DG Chest Port 1 View  Result Date: 11/26/2020 CLINICAL DATA:  Chest pain.  Gastrointestinal bleeding EXAM: PORTABLE CHEST 1 VIEW COMPARISON:  September 24, 2020 FINDINGS: No edema or airspace opacity. Heart size and pulmonary vascular normal. No adenopathy. Aortic valve replacement present. There is aortic atherosclerosis. There is postoperative change in the lower cervical spine. Bones are osteoporotic. IMPRESSION: No edema or airspace opacity. Heart size within normal limits. Status post aortic valve replacement. Aortic Atherosclerosis (ICD10-I70.0). Electronically Signed   By: Lowella Grip III M.D.   On: 11/26/2020 14:30    Orson Eva, DO  [facility]  If 7PM-7AM, please contact night-coverage [email or web address] Password [email or web address] 11/28/2020, 1:57 PM   LOS: 0 days

## 2020-11-28 NOTE — Procedures (Signed)
Small Bowel [place] Capsule Study Procedure date:  11/27/2020  Referring Provider:  Orson Eva, MD PCP:  Dr. [name], MD  Indication for procedure:  Hematochezia  Findings: Capsule endoscopy was deployed via esophagogastroduodenospy in the duodenum.  Capsule reach the colon adequately.  In the distal small bowel (1:15) there was evidence of a speck of fresh blood and a questionable nodule/small mass in the distal small bowel.  This nodule did not look ulcerated.  Also, there was presence of hematochezia throughout the colon but no fresh blood was observed.     First Gastric image:  00:02:38 First Duodenal image: 00:03:52 First Ileo-Cecal Valve image: 01:6:28 First Cecal image: 01:26:33 Gastric Passage time: 0h 104mSmall Bowel Passage time:  1h 259mSummary & Recommendations: Patient presenting with recurrent bleeding, likely source in the distal small bowel.  As she had recent colonoscopy with evaluation of the terminal ileum up to 25 cm from the ileocecal valve without presence of fresh blood, I consider that these findings could be explained by intermittent bleeding Dieulafoy lesion versus a questionable small bowel distal nodule (previously seen in capsule endoscopy from 2013).  I consider it important to obtain a CTA stat.  If this does not show any alterations, we may need to repeat a colonoscopy to determine if we can reach the nodule in the distal small bowel.  If the patient proves to have persistent clinical bleeding, will need to consider retrograde double-balloon enteroscopy.  - CTA abdomen pelvis STAT - Will consider repeat colonoscopy based on findings above. - Check H/H every day  I personally communicated these recommendations to the patient and daughter.  DaMaylon PeppersMD Gastroenterology and Hepatology [facility]

## 2020-11-28 NOTE — Progress Notes (Signed)
Denise Parrish, M.D. Gastroenterology & Hepatology   Interval History:  Patient is in the room with her daughter.  She reports feeling well and states her bowel movements have been "brown as usual".  Denies any abdominal pain, nausea,, vomiting, fever, chills. Labs from today showed stable hemoglobin of 8.6 MCV 87, BUN is also stable 9. Capsule endoscopy performed yesterday which showed presence of specks of fresh blood in the distal small bowel and a questionable nodule/small mass in this area.  There was presence of hematochezia throughout the colon but no fresh blood.  Inpatient Medications:  Current Facility-Administered Medications:  .  0.9 %  sodium chloride infusion, , Intravenous, Continuous, Tat, David, MD, Last Rate: 50 mL/hr at 11/26/20 1841, Rate Change at 11/26/20 1841 .  0.9 %  sodium chloride infusion, 10 mL/hr, Intravenous, Once, Fredia Sorrow, MD .  acetaminophen (TYLENOL) tablet 650 mg, 650 mg, Oral, Q6H PRN **OR** acetaminophen (TYLENOL) suppository 650 mg, 650 mg, Rectal, Q6H PRN, Tat, David, MD .  ALPRAZolam Duanne Moron) tablet 0.5 mg, 0.5 mg, Oral, QHS PRN, Tat, David, MD, 0.5 mg at 11/27/20 2144 .  atorvastatin (LIPITOR) tablet 40 mg, 40 mg, Oral, q1800, Tat, David, MD, 40 mg at 11/27/20 1644 .  cholecalciferol (VITAMIN D3) tablet 1,000 Units, 1,000 Units, Oral, Daily, Tat, David, MD, 1,000 Units at 11/28/20 0914 .  insulin aspart (novoLOG) injection 0-9 Units, 0-9 Units, Subcutaneous, TID WC, Orson Eva, MD, 1 Units at 11/27/20 1645 .  meclizine (ANTIVERT) tablet 12.5 mg, 12.5 mg, Oral, TID PRN, Tat, David, MD .  ondansetron (ZOFRAN) tablet 4 mg, 4 mg, Oral, Q6H PRN **OR** ondansetron (ZOFRAN) injection 4 mg, 4 mg, Intravenous, Q6H PRN, Tat, David, MD .  pantoprazole (PROTONIX) EC tablet 40 mg, 40 mg, Oral, Daily, Montez Morita, Daniel, MD, 40 mg at 11/28/20 0913   I/O    Intake/Output Summary (Last 24 hours) at 11/28/2020 1055 Last data filed at 11/27/2020  1127 Gross per 24 hour  Intake 700 ml  Output -  Net 700 ml     Physical Exam: Temp:  [97.6 F (36.4 C)-98.6 F (37 C)] 98.6 F (37 C) (03/13 0603) Pulse Rate:  [60-82] 73 (03/13 0603) Resp:  [16-23] 16 (03/13 0603) BP: (124-162)/(42-73) 124/42 (03/13 0603) SpO2:  [97 %-100 %] 97 % (03/13 0603)  Temp (24hrs), Avg:98.1 F (36.7 C), Min:97.6 F (36.4 C), Max:98.6 F (37 C) GENERAL: The patient is AO x3, in no acute distress. HEENT: Head is normocephalic and atraumatic. EOMI are intact. Mouth is well hydrated and without lesions. NECK: Supple. No masses LUNGS: Clear to auscultation. No presence of rhonchi/wheezing/rales. Adequate chest expansion HEART: RRR, normal s1 and s2. ABDOMEN: Soft, nontender, no guarding, no peritoneal signs, and nondistended. BS +. No masses. EXTREMITIES: Without any cyanosis, clubbing, rash, lesions or edema. NEUROLOGIC: AOx3, no focal motor deficit. SKIN: no jaundice, no rashes  Laboratory Data: CBC:     Component Value Date/Time   WBC 7.9 11/28/2020 0526   RBC 3.11 (L) 11/28/2020 0526   HGB 8.6 (L) 11/28/2020 0526   HGB 11.8 08/19/2020 1117   HCT 27.3 (L) 11/28/2020 0526   HCT 36.6 08/19/2020 1117   HCT 33 06/24/2012 1029   PLT 208 11/28/2020 0526   PLT 248 08/19/2020 1117   MCV 87.8 11/28/2020 0526   MCV 87 08/19/2020 1117   MCV 81.7 06/24/2012 1029   MCH 27.7 11/28/2020 0526   MCHC 31.5 11/28/2020 0526   RDW 15.3 11/28/2020 0526   RDW  14.8 08/19/2020 1117   LYMPHSABS 1.6 11/26/2020 1154   LYMPHSABS 2.1 08/19/2020 1117   MONOABS 0.5 11/26/2020 1154   EOSABS 0.1 11/26/2020 1154   EOSABS 0.1 08/19/2020 1117   BASOSABS 0.0 11/26/2020 1154   BASOSABS 0.0 08/19/2020 1117   COAG:  Lab Results  Component Value Date   INR 1.0 09/24/2020   INR 1.0 04/26/2020   INR 1.0 04/07/2020    BMP:  BMP Latest Ref Rng & Units 11/28/2020 11/27/2020 11/26/2020  Glucose 70 - 99 mg/dL 110(H) 108(H) 209(H)  BUN 8 - 23 mg/dL _0 Creatinine 0.44 -  1.00 mg/dL 0.92 1.00 1.15(H)  BUN/Creat Ratio 12 - 28 - - -  Sodium 135 - 145 mmol/L 140 140 137  Potassium 3.5 - 5.1 mmol/L 3.7 3.6 3.7  Chloride 98 - 111 mmol/L 108 110 103  CO2 22 - 32 mmol/L _1 Calcium 8.9 - 10.3 mg/dL 8.4(L) 8.4(L) 8.8(L)    HEPATIC:  Hepatic Function Latest Ref Rng & Units 11/26/2020 09/24/2020 04/27/2020  Total Protein 6.5 - 8.1 g/dL 6.3(L) 6.5 5.6(L)  Albumin 3.5 - 5.0 g/dL 3.4(L) 3.3(L) 3.1(L)  AST 15 - 41 U/L 35 34 24  ALT 0 - 44 U/L _2 Alk Phosphatase 38 - 126 U/L 65 81 60  Total Bilirubin 0.3 - 1.2 mg/dL 0.9 1.3(H) 1.6(H)  Bilirubin, Direct 0.0 - 0.2 mg/dL <0.1 - -    CARDIAC:  Lab Results  Component Value Date   TROPONINI <0.03 12/08/2017      Imaging: I personally reviewed and interpreted the available labs, imaging and endoscopic files.   Assessment/Plan: 87 year old female with past medical history of diabetes, carotid artery stenosis, emphysema, GERD, CVA, hypertension, mild cognitive impairment, severe aortic stenosis status post TAVR, coronary artery disease status post stent placement in December 2021 on Plavix, previous history of gastrointestinal bleeding, small bowel lesion of unknown significance found in 2013, who came to the hospital after presenting recurrent episodes of hematochezia.   The patient presented a significant drop in her hemoglobin with low was value of 7.9 for which she received transfusion of 1 unit of PRBC.  Underwent an EGD which was positive for short segment Barrett's esophagus but no other alterations, capsule endoscopy was deployed.  Colonoscopy on same day showed presence of 2 small nonbleeding polyps (these were not resected at the patient is on Plavix), diverticulosis.  Notably, the scope was advanced up to 25 cm from the ileocecal valve there was no presence of hematin in the small bowel.  The patient subsequently underwent a capsule endoscopy which  showed presence of specks of fresh blood in the distal small  bowel and a questionable nodule/small mass in this area.  Due to this, I consider it important to evaluate the area of bleeding with CTA stat today and if there is any active bleeding she will need to have evaluation by IR stat for embolization.  If this fails to show any active ongoing bleeding, evaluation of the area of bleeding with a repeat colonoscopy and an attempt to reach the area should be pursued.  It is possible that her current bleeding is related to intermittent bleeding from the Dieulafoy lesion versus bleeding from the questionable mass in the small bowel.  Ultimately, if this area cannot be reached and she presents recurrent bleeding, she may require to be transferred for a retrograde double-balloon enteroscopy.  # Distal small bowel bleeding - Dieulafoy vs small bowel mass -  Repeat CBC qday, transfuse if Hb <7 - CTA abdomen pelvis STAT - Please reach IR if ongoing bleeding in scan - 2 large bore IV lines - Active T/S - Avoid NSAIDs/AC - C/w CLD - Will proceed with repeat colonoscopy Tuesday if negative CTA  Denise Peppers, MD Gastroenterology and Hepatology [facility]

## 2020-11-29 ENCOUNTER — Encounter (HOSPITAL_COMMUNITY): Payer: Medicare Other

## 2020-11-29 ENCOUNTER — Encounter (HOSPITAL_COMMUNITY): Payer: Self-pay | Admitting: Gastroenterology

## 2020-11-29 DIAGNOSIS — Z952 Presence of prosthetic heart valve: Secondary | ICD-10-CM | POA: Diagnosis not present

## 2020-11-29 DIAGNOSIS — K922 Gastrointestinal hemorrhage, unspecified: Secondary | ICD-10-CM | POA: Diagnosis not present

## 2020-11-29 DIAGNOSIS — I10 Essential (primary) hypertension: Secondary | ICD-10-CM | POA: Diagnosis not present

## 2020-11-29 DIAGNOSIS — D62 Acute posthemorrhagic anemia: Secondary | ICD-10-CM

## 2020-11-29 LAB — CBC
HCT: 29.5 % — ABNORMAL LOW (ref 36.0–46.0)
Hemoglobin: 9 g/dL — ABNORMAL LOW (ref 12.0–15.0)
MCH: 27.3 pg (ref 26.0–34.0)
MCHC: 30.5 g/dL (ref 30.0–36.0)
MCV: 89.4 fL (ref 80.0–100.0)
Platelets: 224 10*3/uL (ref 150–400)
RBC: 3.3 MIL/uL — ABNORMAL LOW (ref 3.87–5.11)
RDW: 15 % (ref 11.5–15.5)
WBC: 5.7 10*3/uL (ref 4.0–10.5)
nRBC: 0 % (ref 0.0–0.2)

## 2020-11-29 LAB — BASIC METABOLIC PANEL
Anion gap: 9 (ref 5–15)
BUN: 7 mg/dL — ABNORMAL LOW (ref 8–23)
CO2: 24 mmol/L (ref 22–32)
Calcium: 8.7 mg/dL — ABNORMAL LOW (ref 8.9–10.3)
Chloride: 108 mmol/L (ref 98–111)
Creatinine, Ser: 0.96 mg/dL (ref 0.44–1.00)
GFR, Estimated: 54 mL/min — ABNORMAL LOW (ref >60)
Glucose, Bld: 106 mg/dL — ABNORMAL HIGH (ref 70–99)
Potassium: 4 mmol/L (ref 3.5–5.1)
Sodium: 141 mmol/L (ref 135–145)

## 2020-11-29 LAB — GLUCOSE, CAPILLARY
Glucose-Capillary: 108 mg/dL — ABNORMAL HIGH (ref 70–99)
Glucose-Capillary: 115 mg/dL — ABNORMAL HIGH (ref 70–99)
Glucose-Capillary: 108 mg/dL — ABNORMAL HIGH (ref 70–99)
Glucose-Capillary: 172 mg/dL — ABNORMAL HIGH (ref 70–99)

## 2020-11-29 MED ORDER — CLOPIDOGREL BISULFATE 75 MG PO TABS
75.0000 mg | ORAL_TABLET | Freq: Every day | ORAL | Status: DC
  Administered 2020-11-29 – 2020-11-30 (×2): 75 mg via ORAL
  Filled 2020-11-29 (×2): qty 1

## 2020-11-29 NOTE — Progress Notes (Addendum)
Subjective: No overt GI bleeding. No N/V. No abdominal pain. Tolerating clears. Wants to have colon prep much earlier in day if colonoscopy is needed.   Objective: Vital signs in last 24 hours: Temp:  [97.7 F (36.5 C)-98.4 F (36.9 C)] 98.4 F (36.9 C) (03/14 1329) Pulse Rate:  [65-89] 65 (03/14 1329) Resp:  [16-18] 18 (03/14 1329) BP: (117-150)/(50-61) 117/52 (03/14 1329) SpO2:  [98 %-100 %] 98 % (03/14 1329) Last BM Date: 11/28/20 General:   Alert and oriented, pleasant Head:  Normocephalic and atraumatic. Eyes:  No icterus, sclera clear. Conjuctiva pink.  Abdomen:  Bowel sounds present, soft, non-tender, non-distended. Limited exam with patient sitting up in chair Neurologic:  Alert and  oriented x4 Psych:  Alert and cooperative. Normal mood and affect.  Intake/Output from previous day: 03/13 0701 - 03/14 0700 In: 960 [P.O.:960] Out: 1000 [Urine:1000] Intake/Output this shift: Total I/O In: 600 [P.O.:600] Out: 700 [Urine:700]  Lab Results: Recent Labs    11/27/20 0538 11/28/20 0526 11/29/20 0553  WBC 6.2 7.9 5.7  HGB 8.8* 8.6* 9.0*  HCT 27.2* 27.3* 29.5*  PLT 201 208 224   BMET Recent Labs    11/27/20 0538 11/28/20 0526 11/29/20 0553  NA 140 140 141  K 3.6 3.7 4.0  CL 110 108 108  CO2 22 23 24   GLUCOSE 108* 110* 106*  BUN 16 9 7*  CREATININE 1.00 0.92 0.96  CALCIUM 8.4* 8.4* 8.7*    Studies/Results: CT Angio Abd/Pel w/ and/or w/o  Result Date: 11/28/2020 CLINICAL DATA:  Lower GI bleed, rectal bleeding and diarrhea for 5 days EXAM: CTA ABDOMEN AND PELVIS WITHOUT AND WITH CONTRAST TECHNIQUE: Multidetector CT imaging of the abdomen and pelvis was performed using the standard protocol during bolus administration of intravenous contrast. Multiplanar reconstructed images and MIPs were obtained and reviewed to evaluate the vascular anatomy. CONTRAST:  73mL OMNIPAQUE IOHEXOL 350 MG/ML SOLN COMPARISON:  04/28/2020 FINDINGS: VASCULAR Aorta: Normal caliber  aorta without aneurysm, dissection, vasculitis or significant stenosis. Severe aortic atherosclerosis. Celiac: Patent without evidence of aneurysm, dissection, vasculitis or significant stenosis. SMA: Patent without evidence of aneurysm, dissection, vasculitis or significant stenosis. Renals: Both renal arteries are patent without evidence of aneurysm, dissection, vasculitis, fibromuscular dysplasia or significant stenosis. IMA: Atherosclerosis at the origin.  The vessel is patent. Inflow: Patent without evidence of aneurysm, dissection, vasculitis or significant stenosis. Proximal Outflow: Bilateral common femoral and visualized portions of the superficial and profunda femoral arteries are patent without evidence of aneurysm, dissection, vasculitis or significant stenosis. Veins: No venous abnormality. Review of the MIP images confirms the above findings. NON-VASCULAR Lower chest: No acute abnormality. Hepatobiliary: No focal liver abnormality is seen. Large gallstone near the gallbladder neck. No gallbladder wall thickening, or biliary dilatation. Pancreas: Unremarkable. No pancreatic ductal dilatation or surrounding inflammatory changes. Spleen: Normal in size without focal abnormality. Adrenals/Urinary Tract: Adrenal glands are unremarkable. Kidneys are normal, without renal calculi, solid lesion, or hydronephrosis. There is a hyperdense, nonenhancing partially exophytic hemorrhagic or proteinaceous cyst of the lateral midportion of the right kidney (series 4, image 31). Bladder is unremarkable. Stomach/Bowel: Stomach is within normal limits. Appendix appears normal. Sigmoid diverticulosis. Metallic clip in the proximal sigmoid. Lymphatic: No significant vascular findings are present. No enlarged abdominal or pelvic lymph nodes. Reproductive: Uterus and bilateral adnexa are unremarkable. Other: There is an intermediate attenuation fluid collection in the right groin overlying the common iliac vessels measuring  in total approximately 4.0 x 2.2 cm (series 4, image  72). No abdominal wall hernia or abnormality. No abdominopelvic ascites. Musculoskeletal: No acute or significant osseous findings. Status post right hip total arthroplasty, streak artifact limiting evaluation of the pelvis. IMPRESSION: 1. Metallic streak artifact from right hip total arthroplasty and metallic clip within the sigmoid colon somewhat limit evaluation, however there is no evidence of intraluminal contrast or other findings to localize source of GI bleeding. 2. There is an intermediate attenuation fluid collection the right groin overlying the common iliac vessels measuring in total approximately 4.0 x 2.2 cm, which may reflect hematoma, seroma, or alternately pseudoaneurysm following femoral vascular puncture. Metallic streak artifact from adjacent hip arthroplasty substantially limits evaluation for contrast enhancement. Doppler ultrasound may be used to exclude pseudoaneurysm if there is clinical concern. 3. There is otherwise aortic and branch vessel atherosclerosis without aneurysm, dissection, or other acute pathology. 4. Sigmoid diverticulosis without evidence of diverticulitis. 5. Cholelithiasis without evidence of cholecystitis. Aortic Atherosclerosis (ICD10-I70.0). Electronically Signed   By: Eddie Candle M.D.   On: 11/28/2020 14:03    Assessment: 87 year old female with past medical history of diabetes, carotid artery stenosis, emphysema, GERD, CVA, hypertension, mild cognitive impairment, severe aortic stenosis status post TAVR, coronary artery disease status post stent placement in December 2021 on Plavix, prior history of gastrointestinal bleeding, small bowel lesion of unknown significance found in 2013, presenting with symptomatic anemia and melena. Received 1 unit PRBCs this admission.   She has had most recently GI bleeding in Aug 2021 as well with EGD at that time unrevealing, GI bleeding scan negative. This admission, she  underwent both EGD and colonoscopy, with EGD showing findings suggestive of short segment Barrett's, normal stomach and small bowel, and colonoscopy with diverticulosis but no active bleeding. 2 polyps were not removed as she was on Plavix. Capsule study then completed with questionable nodule/small mass in distal small bowel but without ulceration. Evidence of hematochezia throughout colon but no fresh blood. Query Dieulafoy lesion as culprit for obscure GI bleeding vs small bowel nodule. CTA completed without source for bleeding but did note fluid collection in right groin overlying common iliac vessels that could reflect a hematoma, seroma, or pseudoaneurysm, which was reviewed by Dr. Carles Collet with Vascular and recommended non-operative management.   Clinically, patient is without overt GI bleeding and has had improvement in Hgb from yesterday (8.6 to 9.0). Consideration had been raised for repeat colonoscopy this admission with attempt at reaching nodule in distal small bowel, but I suspect this may be low yield as active bleeding appears to have tapered. Will discuss further with attending.    Plan: Continue clear liquids for now. If no colonoscopy planned, will advance diet Continue PPI daily Follow H/H Follow-up on pathology Further recommendations to follow   Annitta Needs, PhD, ANP-BC [facility]    LOS: 1 day    11/29/2020, 1:40 PM

## 2020-11-29 NOTE — Progress Notes (Addendum)
Late Entry:  Results of CTA abd/pelvis reviewed on 3/13 -no evidence of intraluminal contrast or other findings to localize source of GI bleeding. -intermediate attenuation fluid collection the right groin overlying the common iliac vessels measuring in total approximately 4.0 x 2.2 cm -Given hx of TAVR 09/28/20 and post op femoral artery dissection with repair-->case discussed with V[facility], Dr. Trula Slade whom reviewed films -daughter and patient updated and re-examined-->no R-leg/foot pain, no right groin pain, erythema, drainage, edema;  R-foot warm with faint PT pulses, neurovascular intact -Dr. Trula Slade advised nonoperative management  Orson Eva, DO

## 2020-11-29 NOTE — Progress Notes (Signed)
PROGRESS NOTE  Denise Parrish QZR:[id number] DOB: March 19, 1923 DOA: 11/26/2020 PCP: [name], MD  Brief History: 87 y.o.femalewith medical history ofaortic stenosis status postTAVR1/07/2021,coronary artery disease status post DES to left circumflex 09/08/2020, diabetes mellitus type 2, hypertension, hyperlipidemia, iron deficiency anemia, stroke, GI bleed, mild cognitive impairment, and diastolic CHF presenting with 3-day history of maroon stools progressing to melanotic stools. The patient denies any fevers, chills, chest pain, shortness breath, dizziness, nausea, vomiting, diarrhea, abdominal pain, dysuria, hematuria. There is no hematemesis.  Notably, the patient was started on Plavix monotherapy after her TAVR on 09/28/2020. She has continued on Plavix uninterrupted since that time. Her postoperative course was complicated by dissection of her right femoral artery. This was repaired by vascular surgery. She has not had any problems until recently. However, the patient had a complicated hospital admission from 04/26/2020 to 05/07/2020 for acute blood loss anemia where she had an extensive work-up. During that hospitalization, the patient underwent EGD which showed noactive bleeding and one gastric polyp which was removed. She underwent a capsule endoscopy which was a limited study as the capsule remained in the stomach for more than 11 hours. It did not show any active bleeding. A tagged red blood cell scan on 04/29/2020 was negative for any active bleed. She was transfused 2 units PRBC at that time. In the emergency department, the patient was afebrile and hemodynamically stable. BMP showed a sodium 137, potassium 3.7, serum creatinine 1.5. LFTs were unremarkable. WBC 6.3, hemoglobin 7.9, platelets 229,000. Chest x-ray was negative for any acute findings. EKG shows sinus rhythm with nonspecific ST-T wave changes.  Assessment/Plan: Acute blood loss anemia -GI  consultappreciated -1 unit PRBC ordered -Baseline hemoglobin~9-10 -Presented with hemoglobin 7.9 -11/27/20 EGD--short segment Barrett's -11/27/20 colonoscopy--2 polyps ascending colon; sigmoid and descending colon diverticulosis -capsule placed in SB by endoscopy 3/12-->evidence of a speck of fresh blood and a questionable nodule/small mass (?Dieulafoy lesion) in the distal small bowel. This nodule did not look ulcerated. Also, there was presence of hematochezia throughout the colon but no fresh blood was observed -Holding Plavix--last dose on morning 11/26/20 -CTA abd---no evidence of intraluminal contrast or other findings to localize source of GI bleeding. -intermediate attenuation fluid collection the right groin overlying the common iliac vessels measuring in total approximately 4.0 x 2.2 cm -Continue Protonix  -Clear liquid diet for now>>soft diet -am CBC -11/29/20--discussed with GI-->ok to restart plavix  Right groin fluid collection --Given hx of TAVR 09/28/20 and post op femoral artery dissection with repair-->case discussed with VVS, Dr. Trula Slade whom reviewed films -daughter and patient updated and re-examined-->no R-leg/foot pain, no right groin pain, erythema, drainage, edema;  R-foot warm with faint PT pulses, neurovascular intact -Dr. Trula Slade advised nonoperative management  CKD stage IIIb -Baseline creatinine 0.9-1.2 -A.m. BMP  Status post TAVR -Patient has continued on Plavix uninterrupted since TAVR on 09/28/2020 -Hold Plavix temporarily>>restart 11/29/20  Coronary artery disease -Status post DES to the left circumflex 09/08/2020 -Holding Plavix temporarily>>restart 3/14  Diabetes mellitus type 2, controlled -04/07/2020 hemoglobin A1c 7.0 -11/26/20 A1c--6.4 -Holding Amaryl -CBGs controlled  Essential hypertension -Holding metoprolol secondary to soft blood pressure -soft BPs controlled  Hyperlipidemia -Continue statin  Chronic diastolic  CHF -Clinically compensated -10/25/2020 echo EF 70-25%, grade 1 DD, mildly elevated PASP, mild MS  Unspecified CVA in January 2021 and TIA in July 2021 -Recent right CEA on 2-21 for symptomatic right carotid artery stenosis -Holding Plavix temporarily>>restart 3/14  Anxiety disorder -  Continue home dose of alprazolam     Status is: Observation  The patient will require care spanning > 2 midnights and should be moved to inpatient because:Ongoing diagnostic testing needed not appropriate for outpatient work up  Dispo: The patient is from:Home Anticipated d/c is TM:HDQQ Patient currently is not medically stable to d/c. Difficult to place patient No        Family Communication:Daughter updatedat bedside 3/13  Consultants:GI  Code Status: FULL   DVT Prophylaxis: SCDs   Procedures: As Listed in Progress Note Above  Antibiotics: None   Subjective: Patient denies fevers, chills, headache, chest pain, dyspnea, nausea, vomiting, diarrhea, abdominal pain, dysuria, hematuria, hematochezia, and melena.   Objective: Vitals:   11/28/20 1357 11/28/20 2101 11/29/20 0535 11/29/20 1329  BP: (!) 136/50 (!) 150/61 (!) 145/61 (!) 117/52  Pulse: 65 89 87 65  Resp: 18 18 16 18   Temp: 97.7 F (36.5 C) 98.3 F (36.8 C) 98 F (36.7 C) 98.4 F (36.9 C)  TempSrc: Oral Oral Oral Oral  SpO2: 98% 99% 100% 98%  Weight:      Height:        Intake/Output Summary (Last 24 hours) at 11/29/2020 1520 Last data filed at 11/29/2020 1300 Gross per 24 hour  Intake 1080 ml  Output 700 ml  Net 380 ml   Weight change:  Exam:   General:  Pt is alert, follows commands appropriately, not in acute distress  HEENT: No icterus, No thrush, No neck mass, [place]/AT  Cardiovascular: RRR, S1/S2, no rubs, no gallops  Respiratory: CTA bilaterally, no wheezing, no crackles, no rhonchi  Abdomen: Soft/+BS, non tender, non distended,  no guarding  Extremities: trace LE edema, No lymphangitis, No petechiae, No rashes, no synovitis   Data Reviewed: I have personally reviewed following labs and imaging studies Basic Metabolic Panel: Recent Labs  Lab 11/26/20 1154 11/27/20 0538 11/28/20 0526 11/29/20 0553  NA 137 140 140 141  K 3.7 3.6 3.7 4.0  CL 103 110 108 108  CO2 22 22 23 24   GLUCOSE 209* 108* 110* 106*  BUN 22 16 9  7*  CREATININE 1.15* 1.00 0.92 0.96  CALCIUM 8.8* 8.4* 8.4* 8.7*   Liver Function Tests: Recent Labs  Lab 11/26/20 1154  AST 35  ALT 28  ALKPHOS 65  BILITOT 0.9  PROT 6.3*  ALBUMIN 3.4*   No results for input(s): LIPASE, AMYLASE in the last 168 hours. No results for input(s): AMMONIA in the last 168 hours. Coagulation Profile: No results for input(s): INR, PROTIME in the last 168 hours. CBC: Recent Labs  Lab 11/26/20 1154 11/27/20 0538 11/28/20 0526 11/29/20 0553  WBC 6.3 6.2 7.9 5.7  NEUTROABS 4.0  --   --   --   HGB 7.9* 8.8* 8.6* 9.0*  HCT 25.6* 27.2* 27.3* 29.5*  MCV 87.1 86.3 87.8 89.4  PLT 229 201 208 224   Cardiac Enzymes: No results for input(s): CKTOTAL, CKMB, CKMBINDEX, TROPONINI in the last 168 hours. BNP: Invalid input(s): POCBNP CBG: Recent Labs  Lab 11/28/20 1135 11/28/20 1659 11/28/20 2116 11/29/20 0736 11/29/20 1110  GLUCAP 118* 97 129* 108* 115*   HbA1C: Recent Labs    11/27/20 0538  HGBA1C 6.4*   Urine analysis:    Component Value Date/Time   COLORURINE STRAW (A) 09/30/2020 0724   APPEARANCEUR CLEAR 09/30/2020 0724   LABSPEC 1.009 09/30/2020 0724   PHURINE 5.0 09/30/2020 0724   GLUCOSEU NEGATIVE 09/30/2020 0724   HGBUR SMALL (A) 09/30/2020 0724  BILIRUBINUR NEGATIVE 09/30/2020 [place] 09/30/2020 0724   PROTEINUR NEGATIVE 09/30/2020 0724   UROBILINOGEN 1.0 05/23/2015 1956   NITRITE NEGATIVE 09/30/2020 0724   LEUKOCYTESUR NEGATIVE 09/30/2020 0724   Sepsis  Labs: @LABRCNTIP (procalcitonin:4,lacticidven:4) ) Recent Results (from the past 240 hour(s))  Resp Panel by RT-PCR (Flu A&B, Covid) Nasopharyngeal Swab     Status: None   Collection Time: 11/26/20  2:02 PM   Specimen: Nasopharyngeal Swab; Nasopharyngeal(NP) swabs in vial transport medium  Result Value Ref Range Status   SARS Coronavirus 2 by RT PCR NEGATIVE NEGATIVE Final    Comment: (NOTE) SARS-CoV-2 target nucleic acids are NOT DETECTED.  The SARS-CoV-2 RNA is generally detectable in upper respiratory specimens during the acute phase of infection. The lowest concentration of SARS-CoV-2 viral copies this assay can detect is 138 copies/mL. A negative result does not preclude SARS-Cov-2 infection and should not be used as the sole basis for treatment or other patient management decisions. A negative result may occur with  improper specimen collection/handling, submission of specimen other than nasopharyngeal swab, presence of viral mutation(s) within the areas targeted by this assay, and inadequate number of viral copies(<138 copies/mL). A negative result must be combined with clinical observations, patient history, and epidemiological information. The expected result is Negative.  Fact Sheet for Patients:  EntrepreneurPulse.com.au  Fact Sheet for Healthcare Providers:  IncredibleEmployment.be  This test is no t yet approved or cleared by the [place] FDA and  has been authorized for detection and/or diagnosis of SARS-CoV-2 by FDA under an Emergency Use Authorization (EUA). This EUA will remain  in effect (meaning this test can be used) for the duration of the COVID-19 declaration under Section 564(b)(1) of the Act, 21 U.S.C.section 360bbb-3(b)(1), unless the authorization is terminated  or revoked sooner.       Influenza A by PCR NEGATIVE NEGATIVE Final   Influenza B by PCR NEGATIVE NEGATIVE Final    Comment: (NOTE) The Xpert Xpress  SARS-CoV-2/FLU/RSV plus assay is intended as an aid in the diagnosis of influenza from Nasopharyngeal swab specimens and should not be used as a sole basis for treatment. Nasal washings and aspirates are unacceptable for Xpert Xpress SARS-CoV-2/FLU/RSV testing.  Fact Sheet for Patients: EntrepreneurPulse.com.au  Fact Sheet for Healthcare Providers: IncredibleEmployment.be  This test is not yet approved or cleared by the [place] FDA and has been authorized for detection and/or diagnosis of SARS-CoV-2 by FDA under an Emergency Use Authorization (EUA). This EUA will remain in effect (meaning this test can be used) for the duration of the COVID-19 declaration under Section 564(b)(1) of the Act, 21 U.S.C. section 360bbb-3(b)(1), unless the authorization is terminated or revoked.  Performed at [facility], [address] [place], [place] [address]      Scheduled Meds: . atorvastatin  40 mg Oral q1800  . cholecalciferol  1,000 Units Oral Daily  . clopidogrel  75 mg Oral Daily  . insulin aspart  0-9 Units Subcutaneous TID WC  . pantoprazole  40 mg Oral Daily   Continuous Infusions: . sodium chloride 50 mL/hr at 11/26/20 1841  . sodium chloride      Procedures/Studies: DG Chest Port 1 View  Result Date: 11/26/2020 CLINICAL DATA:  Chest pain.  Gastrointestinal bleeding EXAM: PORTABLE CHEST 1 VIEW COMPARISON:  September 24, 2020 FINDINGS: No edema or airspace opacity. Heart size and pulmonary vascular normal. No adenopathy. Aortic valve replacement present. There is aortic atherosclerosis. There is postoperative change in the lower cervical spine. Bones  are osteoporotic. IMPRESSION: No edema or airspace opacity. Heart size within normal limits. Status post aortic valve replacement. Aortic Atherosclerosis (ICD10-I70.0). Electronically Signed   By: Lowella Grip III M.D.   On: 11/26/2020 14:30   CT Angio Abd/Pel w/ and/or w/o  Result Date:  11/28/2020 CLINICAL DATA:  Lower GI bleed, rectal bleeding and diarrhea for 5 days EXAM: CTA ABDOMEN AND PELVIS WITHOUT AND WITH CONTRAST TECHNIQUE: Multidetector CT imaging of the abdomen and pelvis was performed using the standard protocol during bolus administration of intravenous contrast. Multiplanar reconstructed images and MIPs were obtained and reviewed to evaluate the vascular anatomy. CONTRAST:  27m OMNIPAQUE IOHEXOL 350 MG/ML SOLN COMPARISON:  04/28/2020 FINDINGS: VASCULAR Aorta: Normal caliber aorta without aneurysm, dissection, vasculitis or significant stenosis. Severe aortic atherosclerosis. Celiac: Patent without evidence of aneurysm, dissection, vasculitis or significant stenosis. SMA: Patent without evidence of aneurysm, dissection, vasculitis or significant stenosis. Renals: Both renal arteries are patent without evidence of aneurysm, dissection, vasculitis, fibromuscular dysplasia or significant stenosis. IMA: Atherosclerosis at the origin.  The vessel is patent. Inflow: Patent without evidence of aneurysm, dissection, vasculitis or significant stenosis. Proximal Outflow: Bilateral common femoral and visualized portions of the superficial and profunda femoral arteries are patent without evidence of aneurysm, dissection, vasculitis or significant stenosis. Veins: No venous abnormality. Review of the MIP images confirms the above findings. NON-VASCULAR Lower chest: No acute abnormality. Hepatobiliary: No focal liver abnormality is seen. Large gallstone near the gallbladder neck. No gallbladder wall thickening, or biliary dilatation. Pancreas: Unremarkable. No pancreatic ductal dilatation or surrounding inflammatory changes. Spleen: Normal in size without focal abnormality. Adrenals/Urinary Tract: Adrenal glands are unremarkable. Kidneys are normal, without renal calculi, solid lesion, or hydronephrosis. There is a hyperdense, nonenhancing partially exophytic hemorrhagic or proteinaceous cyst of  the lateral midportion of the right kidney (series 4, image 31). Bladder is unremarkable. Stomach/Bowel: Stomach is within normal limits. Appendix appears normal. Sigmoid diverticulosis. Metallic clip in the proximal sigmoid. Lymphatic: No significant vascular findings are present. No enlarged abdominal or pelvic lymph nodes. Reproductive: Uterus and bilateral adnexa are unremarkable. Other: There is an intermediate attenuation fluid collection in the right groin overlying the common iliac vessels measuring in total approximately 4.0 x 2.2 cm (series 4, image 72). No abdominal wall hernia or abnormality. No abdominopelvic ascites. Musculoskeletal: No acute or significant osseous findings. Status post right hip total arthroplasty, streak artifact limiting evaluation of the pelvis. IMPRESSION: 1. Metallic streak artifact from right hip total arthroplasty and metallic clip within the sigmoid colon somewhat limit evaluation, however there is no evidence of intraluminal contrast or other findings to localize source of GI bleeding. 2. There is an intermediate attenuation fluid collection the right groin overlying the common iliac vessels measuring in total approximately 4.0 x 2.2 cm, which may reflect hematoma, seroma, or alternately pseudoaneurysm following femoral vascular puncture. Metallic streak artifact from adjacent hip arthroplasty substantially limits evaluation for contrast enhancement. Doppler ultrasound may be used to exclude pseudoaneurysm if there is clinical concern. 3. There is otherwise aortic and branch vessel atherosclerosis without aneurysm, dissection, or other acute pathology. 4. Sigmoid diverticulosis without evidence of diverticulitis. 5. Cholelithiasis without evidence of cholecystitis. Aortic Atherosclerosis (ICD10-I70.0). Electronically Signed   By: AEddie CandleM.D.   On: 11/28/2020 14:03    DOrson Eva DO  [facility]  If 7PM-7AM, please contact  night-coverage [email or web address] Password [email or web address] 11/29/2020, 3:20 PM   LOS: 1 day

## 2020-11-29 NOTE — Progress Notes (Signed)
Discharge Progress Report  Patient Details  Name: Denise Parrish MRN: [id number] Date of Birth: [birth year] Referring Provider:   Flowsheet Row CARDIAC REHAB PHASE II ORIENTATION from 11/11/2020 in [facility]  Referring Provider Dr. Johnsie Cancel       Number of Visits: 4  Reason for Discharge:  Early Exit:  Personal  Smoking History:  Social History   Tobacco Use  Smoking Status Never Smoker  Smokeless Tobacco Never Used  Tobacco Comment   Quit x 50 years    Diagnosis:  S/P coronary artery stent placement  S/P TAVR (transcatheter aortic valve replacement)  ADL [facility]:   Initial Exercise Prescription:  Initial Exercise Prescription - 11/11/20 1500      Date of Initial Exercise RX and Referring Provider   Date 11/11/20    Referring Provider Dr. Johnsie Cancel    Expected Discharge Date 02/02/21      NuStep   Level 1    SPM 60    Minutes 17      Arm Ergometer   Level 1    RPM 40    Minutes 22      Prescription Details   Frequency (times per week) 3      Intensity   THRR 40-80% of Max Heartrate 50-100    Ratings of Perceived Exertion 11-13      Progression   Progression Continue to progress workloads to maintain intensity without signs/symptoms of physical distress.      Resistance Training   Training Prescription Yes    Weight 1    Reps 10-15           Discharge Exercise Prescription (Final Exercise Prescription Changes):  Exercise Prescription Changes - 11/22/20 1545      Response to Exercise   Blood Pressure (Admit) 120/66    Blood Pressure (Exercise) 122/46    Blood Pressure (Exit) 80/40    Heart Rate (Admit) 77 bpm    Heart Rate (Exercise) 103 bpm    Heart Rate (Exit) 86 bpm    Rating of Perceived Exertion (Exercise) 14    Duration Continue with 30 min of aerobic exercise without signs/symptoms of physical distress.    Intensity THRR unchanged      Progression   Progression Continue to progress workloads to maintain  intensity without signs/symptoms of physical distress.      Resistance Training   Training Prescription Yes    Weight 1    Reps 10-15    Time 10 Minutes      NuStep   Level 1    SPM 53    Minutes 17    METs 1.7           Functional Capacity:  6 Minute Walk    Row Name 11/11/20 1510         6 Minute Walk   Phase Initial     Distance 400 feet     Walk Time 6 minutes     # of Rest Breaks 3     MPH 0.8     METS 0.17     RPE 15     VO2 Peak 0.59     Symptoms Yes (comment)     Comments shortness of breath and leg fatigue     Resting HR 80 bpm     Resting BP 140/58     Resting Oxygen Saturation  99 %     Exercise Oxygen Saturation  during 6 min walk 98 %  Max Ex. HR 101 bpm     Max Ex. BP 164/52     2 Minute Post BP 138/54            Psychological, QOL, Others - Outcomes: PHQ 2/9: Depression screen PHQ 2/9 11/11/2020  Decreased Interest 0  Down, Depressed, Hopeless 0  PHQ - 2 Score 0  Altered sleeping 3  Tired, decreased energy 2  Change in appetite 0  Feeling bad or failure about yourself  0  Trouble concentrating 0  Moving slowly or fidgety/restless 0  Suicidal thoughts 0  PHQ-9 Score 5  Difficult doing work/chores Somewhat difficult    Quality of Life:  Quality of Life - 11/11/20 1516      Quality of Life   Select Quality of Life      Quality of Life Scores   Health/Function Pre 22.18 %    Socioeconomic Pre 27.25 %    Psych/Spiritual Pre 24.21 %    Family Pre 25.5 %    GLOBAL Pre 24.05 %           Personal Goals: Goals established at orientation with interventions provided to work toward goal.  Personal Goals and Risk Factors at Admission - 11/11/20 1447      Core Components/Risk Factors/Patient Goals on Admission    Weight Management Weight Maintenance    Diabetes Yes    Intervention Provide education about signs/symptoms and action to take for hypo/hyperglycemia.;Provide education about proper nutrition, including hydration, and  aerobic/resistive exercise prescription along with prescribed medications to achieve blood glucose in normal ranges: Fasting glucose 65-99 mg/dL    Expected Outcomes Short Term: Participant verbalizes understanding of the signs/symptoms and immediate care of hyper/hypoglycemia, proper foot care and importance of medication, aerobic/resistive exercise and nutrition plan for blood glucose control.    Personal Goal Other Yes    Personal Goal Improve strength and energy; improve stamina.    Intervention Provide monitored exercise and education for risk modification.    Expected Outcomes Patient will complete the program and  meet both program and personal goals.            Personal Goals Discharge:  Goals and Risk Factor Review    Row Name 11/17/20 1025             Core Components/Risk Factors/Patient Goals Review   Personal Goals Review Diabetes;Other       Review Patient is new to the program completing 1 session. She was referred for Stent and TAVR. She has multiple risk factors for CAD and is participating in the program for risk modification. He most recent A1C was 7% 04/07/20. Her personal goals for the program are to improve her strenght and stamina. She became lightheaded during her first sessions and her systolic b/p dropped >53 mm hg. Her lightheadness subsided after she rested. We will continue to monitor as she works toward meeting her personal goals.       Expected Outcomes Patient will complete the program meeting both program and personal goals.              Exercise Goals and Review:  Exercise Goals    Row Name 11/11/20 1514 11/22/20 1545           Exercise Goals   Increase Physical Activity Yes Yes      Intervention Provide advice, education, support and counseling about physical activity/exercise needs.;Develop an individualized exercise prescription for aerobic and resistive training based on initial evaluation findings, risk stratification, comorbidities and  participant's personal goals. Provide advice, education, support and counseling about physical activity/exercise needs.;Develop an individualized exercise prescription for aerobic and resistive training based on initial evaluation findings, risk stratification, comorbidities and participant's personal goals.      Expected Outcomes Short Term: Attend rehab on a regular basis to increase amount of physical activity.;Long Term: Add in home exercise to make exercise part of routine and to increase amount of physical activity.;Long Term: Exercising regularly at least 3-5 days a week. Short Term: Attend rehab on a regular basis to increase amount of physical activity.;Long Term: Add in home exercise to make exercise part of routine and to increase amount of physical activity.;Long Term: Exercising regularly at least 3-5 days a week.      Increase Strength and Stamina Yes Yes      Intervention Provide advice, education, support and counseling about physical activity/exercise needs.;Develop an individualized exercise prescription for aerobic and resistive training based on initial evaluation findings, risk stratification, comorbidities and participant's personal goals. Provide advice, education, support and counseling about physical activity/exercise needs.;Develop an individualized exercise prescription for aerobic and resistive training based on initial evaluation findings, risk stratification, comorbidities and participant's personal goals.      Expected Outcomes Short Term: Increase workloads from initial exercise prescription for resistance, speed, and METs.;Short Term: Perform resistance training exercises routinely during rehab and add in resistance training at home;Long Term: Improve cardiorespiratory fitness, muscular endurance and strength as measured by increased METs and functional capacity (6MWT) Short Term: Increase workloads from initial exercise prescription for resistance, speed, and METs.;Short Term:  Perform resistance training exercises routinely during rehab and add in resistance training at home;Long Term: Improve cardiorespiratory fitness, muscular endurance and strength as measured by increased METs and functional capacity (6MWT)      Able to understand and use rate of perceived exertion (RPE) scale Yes Yes      Intervention Provide education and explanation on how to use RPE scale Provide education and explanation on how to use RPE scale      Expected Outcomes Short Term: Able to use RPE daily in rehab to express subjective intensity level;Long Term:  Able to use RPE to guide intensity level when exercising independently Short Term: Able to use RPE daily in rehab to express subjective intensity level;Long Term:  Able to use RPE to guide intensity level when exercising independently      Knowledge and understanding of Target Heart Rate Range (THRR) Yes Yes      Intervention Provide education and explanation of THRR including how the numbers were predicted and where they are located for reference Provide education and explanation of THRR including how the numbers were predicted and where they are located for reference      Expected Outcomes Short Term: Able to state/look up THRR;Long Term: Able to use THRR to govern intensity when exercising independently;Short Term: Able to use daily as guideline for intensity in rehab Short Term: Able to state/look up THRR;Long Term: Able to use THRR to govern intensity when exercising independently;Short Term: Able to use daily as guideline for intensity in rehab      Able to check pulse independently Yes Yes      Intervention Provide education and demonstration on how to check pulse in carotid and radial arteries.;Review the importance of being able to check your own pulse for safety during independent exercise Provide education and demonstration on how to check pulse in carotid and radial arteries.;Review the importance of being able to check your own  pulse for  safety during independent exercise      Expected Outcomes Short Term: Able to explain why pulse checking is important during independent exercise;Long Term: Able to check pulse independently and accurately Short Term: Able to explain why pulse checking is important during independent exercise;Long Term: Able to check pulse independently and accurately      Understanding of Exercise Prescription Yes Yes      Intervention Provide education, explanation, and written materials on patient's individual exercise prescription Provide education, explanation, and written materials on patient's individual exercise prescription      Expected Outcomes Long Term: Able to explain home exercise prescription to exercise independently;Short Term: Able to explain program exercise prescription Long Term: Able to explain home exercise prescription to exercise independently;Short Term: Able to explain program exercise prescription             Exercise Goals Re-Evaluation:  Exercise Goals Re-Evaluation    [place] Name 11/22/20 1545             Exercise Goal Re-Evaluation   Exercise Goals Review Increase Physical Activity;Increase Strength and Stamina;Able to understand and use rate of perceived exertion (RPE) scale;Knowledge and understanding of Target Heart Rate Range (THRR);Able to check pulse independently;Understanding of Exercise Prescription       Comments Patient has completed 3 exercise sessions. She can only complete the first exercise station before she has to stop. She gets tired pretty quickly and after the first station she stops and is done for the day. She does what she can and then needs to stop. She is currently exercising at 1.7 METs on the NuStep. WIll continue to monitor and progress as able.       Expected Outcomes Throughout exercise at home and at rehab patient will achieve their goals.              Nutrition & Weight - Outcomes:  Pre Biometrics - 11/22/20 1545      Pre Biometrics   Weight  77 kg    BMI (Calculated) 28.25            Nutrition:  Nutrition Therapy & Goals - 11/17/20 1023      Personal Nutrition Goals   Comments We will continue to provide heart healthy nutritional education through handouts.      Intervention Plan   Intervention Nutrition handout(s) given to patient.           Nutrition Discharge:  Nutrition Assessments - 11/11/20 1446      MEDFICTS Scores   Pre Score 23           Education Questionnaire Score:  Knowledge Questionnaire Score - 11/11/20 1447      Knowledge Questionnaire Score   Pre Score 20/24          [name] called and said this was too much for her mother to do at this time. She completed 4 sessions. MD will be notifiied.

## 2020-11-29 NOTE — Discharge Summary (Signed)
Physician Discharge Summary  Denise Parrish ZOX:[id number] DOB: 1923-02-06 DOA: 11/26/2020  PCP: [name], MD  Admit date: 11/26/2020 Discharge date: 11/30/2020  Admitted From: Home Disposition:  Home   Recommendations for Outpatient Follow-up:  1. Follow up with PCP in 1-2 weeks 2. Please obtain BMP/CBC in one week   Discharge Condition: Stable CODE STATUS: FULL Diet recommendation: Heart Healthy    Brief/Interim Summary: 87 y.o.femalewith medical history ofaortic stenosis status postTAVR1/07/2021,coronary artery disease status post DES to left circumflex 09/08/2020, diabetes mellitus type 2, hypertension, hyperlipidemia, iron deficiency anemia, stroke, GI bleed, mild cognitive impairment, and diastolic CHF presenting with 3-day history of maroon stools progressing to melanotic stools. The patient denies any fevers, chills, chest pain, shortness breath, dizziness, nausea, vomiting, diarrhea, abdominal pain, dysuria, hematuria. There is no hematemesis.  Notably, the patient was started on Plavix monotherapy after her TAVR on 09/28/2020. She has continued on Plavix uninterrupted since that time. Her postoperative course was complicated by dissection of her right femoral artery. This was repaired by vascular surgery. She has not had any problems until recently. However, the patient had a complicated hospital admission from 04/26/2020 to 05/07/2020 for acute blood loss anemia where she had an extensive work-up. During that hospitalization, the patient underwent EGD which showed noactive bleeding and one gastric polyp which was removed. She underwent a capsule endoscopy which was a limited study as the capsule remained in the stomach for more than 11 hours. It did not show any active bleeding. A tagged red blood cell scan on 04/29/2020 was negative for any active bleed. She was transfused 2 units PRBC at that time. In the emergency department, the patient was afebrile and  hemodynamically stable. BMP showed a sodium 137, potassium 3.7, serum creatinine 1.5. LFTs were unremarkable. WBC 6.3, hemoglobin 7.9, platelets 229,000. Chest x-ray was negative for any acute findings. EKG shows sinus rhythm with nonspecific ST-T wave changes.  The patient was transfused one unit of PRBCs during this admission.  Her Hgb remained stable thereafter.  She did not have any further hematochezia or melena.  GI was consulted to assist.  EGD showed short segment Barrett's and colonoscopy showed polyps and diverticulosis.  Capsule study showed possible focus of bleed in SB with evidence of a speck of fresh blood and a questionable nodule/small mass(?Dieulafoy lesion)in the distal small bowel.  Plavix was restarted and patient was monitored without any further bleeding.  She was tentatively due to stop plavix on 02/26/21 per cardiology with whom she has follow up.  Discharge Diagnoses:  Acute blood loss anemia -GI consultappreciated -1 unit PRBC ordered -Baseline hemoglobin~9-10 -Presented with hemoglobin 7.9 -11/27/20 EGD--short segment Barrett's -11/27/20 colonoscopy--2 polyps ascending colon; sigmoid and descending colon diverticulosis -capsule placed in SB by endoscopy 3/12-->evidence of a speck of fresh blood and a questionable nodule/small mass(?Dieulafoy lesion)in the distal small bowel. This nodule did not look ulcerated. Also, there was presence of hematochezia throughout the colon but no fresh blood was observed -Holding Plavix--last dose on morning3/11/22 -CTA abd---noevidence of intraluminal contrast or other findings to localize source of GI bleeding. -intermediate attenuation fluid collection the right groin overlying the common iliac vessels measuring in total approximately 4.0 x 2.2 cm -ContinueProtonix  -Clear liquid diet for now>>soft diet which patient tolerated -am CBC -11/29/20--discussed with GI-->ok to restart plavix -11/30/20--Hgb stable, no  hematochezia/melena, Hgb 9.3 on day of discharge  Right groin fluid collection --Given hx of TAVR 09/28/20 and post op femoral artery dissection with repair-->case discussed with [facility],  Dr. Trula Slade whom reviewed films -daughter and patient updated and re-examined-->no R-leg/foot pain, no right groin pain, erythema, drainage, edema; R-foot warm with faint PT pulses, neurovascular intact -Dr. Trula Slade advised nonoperative management  CKD stage IIIb -Baseline creatinine 0.9-1.2 -serum creatinine 0.96 at time of d/c  Status post TAVR -Patient has continued on Plavix uninterrupted since TAVR on 09/28/2020 -Hold Plavix temporarily>>restart 11/29/20 -follow up with Dr. Sherren Mocha  Coronary artery disease -Status post DES to the left circumflex 09/08/2020 -Holding Plavix temporarily>>restarted 3/14  Diabetes mellitus type 2, controlled -04/07/2020 hemoglobin A1c 7.0 -11/26/20 A1c--6.4 -Holding Amaryl>>restart after d/c -CBGs controlled  Essential hypertension -Holding metoprolol secondary to soft blood pressure -soft BPs controlled  Hyperlipidemia -Continue statin  Chronic diastolic CHF -Clinically compensated -10/25/2020 echo EF 70-25%, grade 1 DD, mildly elevated PASP, mild MS  Unspecified CVA in January 2021 and TIA in July 2021 -Recent right CEA on 2-21 for symptomatic right carotid artery stenosis -Holding Plavix temporarily>>restart 3/14  Anxiety disorder -Continue home dose of alprazolam  Discharge Instructions   Allergies as of 11/30/2020   No Known Allergies     Medication List    TAKE these medications   ALPRAZolam 0.5 MG tablet Commonly known as: XANAX Take 0.5 mg by mouth at bedtime as needed for anxiety.   atorvastatin 40 MG tablet Commonly known as: LIPITOR Take 1 tablet (40 mg total) by mouth daily at 6 PM.   cholecalciferol 25 MCG (1000 UNIT) tablet Commonly known as: VITAMIN D Take 1,000 Units by mouth daily.   clopidogrel 75 MG  tablet Commonly known as: PLAVIX Take 1 tablet (75 mg total) by mouth daily with breakfast.   glimepiride 1 MG tablet Commonly known as: AMARYL Take 1 mg by mouth daily with breakfast.   LUTEIN PO Take 1 capsule by mouth daily.   meclizine 12.5 MG tablet Commonly known as: ANTIVERT Take 1 tablet (12.5 mg total) by mouth 3 (three) times daily as needed for dizziness.   metoprolol tartrate 25 MG tablet Commonly known as: LOPRESSOR Take 0.5 tablets (12.5 mg total) by mouth 2 (two) times daily.   pantoprazole 40 MG tablet Commonly known as: PROTONIX Take 1 tablet (40 mg total) by mouth daily.   PRESERVISION AREDS 2 PO Take 1 tablet by mouth 2 (two) times daily. Takes 1 tablet in the morning and 1 tablet at night for eyes.   VISINE DRY EYE OP Apply 1 drop to eye 3 (three) times daily as needed (for dry eye relief).   VITAMIN C PO Take 1 tablet by mouth daily.       No Known Allergies  Consultations:  GI   Procedures/Studies: DG Chest Port 1 View  Result Date: 11/26/2020 CLINICAL DATA:  Chest pain.  Gastrointestinal bleeding EXAM: PORTABLE CHEST 1 VIEW COMPARISON:  September 24, 2020 FINDINGS: No edema or airspace opacity. Heart size and pulmonary vascular normal. No adenopathy. Aortic valve replacement present. There is aortic atherosclerosis. There is postoperative change in the lower cervical spine. Bones are osteoporotic. IMPRESSION: No edema or airspace opacity. Heart size within normal limits. Status post aortic valve replacement. Aortic Atherosclerosis (ICD10-I70.0). Electronically Signed   By: Lowella Grip III M.D.   On: 11/26/2020 14:30   CT Angio Abd/Pel w/ and/or w/o  Result Date: 11/28/2020 CLINICAL DATA:  Lower GI bleed, rectal bleeding and diarrhea for 5 days EXAM: CTA ABDOMEN AND PELVIS WITHOUT AND WITH CONTRAST TECHNIQUE: Multidetector CT imaging of the abdomen and pelvis was performed using the standard protocol  during bolus administration of intravenous  contrast. Multiplanar reconstructed images and MIPs were obtained and reviewed to evaluate the vascular anatomy. CONTRAST:  16m OMNIPAQUE IOHEXOL 350 MG/ML SOLN COMPARISON:  04/28/2020 FINDINGS: VASCULAR Aorta: Normal caliber aorta without aneurysm, dissection, vasculitis or significant stenosis. Severe aortic atherosclerosis. Celiac: Patent without evidence of aneurysm, dissection, vasculitis or significant stenosis. SMA: Patent without evidence of aneurysm, dissection, vasculitis or significant stenosis. Renals: Both renal arteries are patent without evidence of aneurysm, dissection, vasculitis, fibromuscular dysplasia or significant stenosis. IMA: Atherosclerosis at the origin.  The vessel is patent. Inflow: Patent without evidence of aneurysm, dissection, vasculitis or significant stenosis. Proximal Outflow: Bilateral common femoral and visualized portions of the superficial and profunda femoral arteries are patent without evidence of aneurysm, dissection, vasculitis or significant stenosis. Veins: No venous abnormality. Review of the MIP images confirms the above findings. NON-VASCULAR Lower chest: No acute abnormality. Hepatobiliary: No focal liver abnormality is seen. Large gallstone near the gallbladder neck. No gallbladder wall thickening, or biliary dilatation. Pancreas: Unremarkable. No pancreatic ductal dilatation or surrounding inflammatory changes. Spleen: Normal in size without focal abnormality. Adrenals/Urinary Tract: Adrenal glands are unremarkable. Kidneys are normal, without renal calculi, solid lesion, or hydronephrosis. There is a hyperdense, nonenhancing partially exophytic hemorrhagic or proteinaceous cyst of the lateral midportion of the right kidney (series 4, image 31). Bladder is unremarkable. Stomach/Bowel: Stomach is within normal limits. Appendix appears normal. Sigmoid diverticulosis. Metallic clip in the proximal sigmoid. Lymphatic: No significant vascular findings are present. No  enlarged abdominal or pelvic lymph nodes. Reproductive: Uterus and bilateral adnexa are unremarkable. Other: There is an intermediate attenuation fluid collection in the right groin overlying the common iliac vessels measuring in total approximately 4.0 x 2.2 cm (series 4, image 72). No abdominal wall hernia or abnormality. No abdominopelvic ascites. Musculoskeletal: No acute or significant osseous findings. Status post right hip total arthroplasty, streak artifact limiting evaluation of the pelvis. IMPRESSION: 1. Metallic streak artifact from right hip total arthroplasty and metallic clip within the sigmoid colon somewhat limit evaluation, however there is no evidence of intraluminal contrast or other findings to localize source of GI bleeding. 2. There is an intermediate attenuation fluid collection the right groin overlying the common iliac vessels measuring in total approximately 4.0 x 2.2 cm, which may reflect hematoma, seroma, or alternately pseudoaneurysm following femoral vascular puncture. Metallic streak artifact from adjacent hip arthroplasty substantially limits evaluation for contrast enhancement. Doppler ultrasound may be used to exclude pseudoaneurysm if there is clinical concern. 3. There is otherwise aortic and branch vessel atherosclerosis without aneurysm, dissection, or other acute pathology. 4. Sigmoid diverticulosis without evidence of diverticulitis. 5. Cholelithiasis without evidence of cholecystitis. Aortic Atherosclerosis (ICD10-I70.0). Electronically Signed   By: AEddie CandleM.D.   On: 11/28/2020 14:03        Discharge Exam: Vitals:   11/29/20 2006 11/30/20 0605  BP: (!) 137/52 (!) 126/46  Pulse: 80 71  Resp: 16 16  Temp: 98 F (36.7 C) 98.7 F (37.1 C)  SpO2: 99% 95%   Vitals:   11/29/20 0535 11/29/20 1329 11/29/20 2006 11/30/20 0605  BP: (!) 145/61 (!) 117/52 (!) 137/52 (!) 126/46  Pulse: 87 65 80 71  Resp: 16 18 16 16   Temp: 98 F (36.7 C) 98.4 F (36.9 C) 98  F (36.7 C) 98.7 F (37.1 C)  TempSrc: Oral Oral    SpO2: 100% 98% 99% 95%  Weight:      Height:        General:  Pt is alert, awake, not in acute distress Cardiovascular: RRR, S1/S2 +, no rubs, no gallops Respiratory: CTA bilaterally, no wheezing, no rhonchi Abdominal: Soft, NT, ND, bowel sounds + Extremities: no edema, no cyanosis   The results of significant diagnostics from this hospitalization (including imaging, microbiology, ancillary and laboratory) are listed below for reference.    Significant Diagnostic Studies: DG Chest Port 1 View  Result Date: 11/26/2020 CLINICAL DATA:  Chest pain.  Gastrointestinal bleeding EXAM: PORTABLE CHEST 1 VIEW COMPARISON:  September 24, 2020 FINDINGS: No edema or airspace opacity. Heart size and pulmonary vascular normal. No adenopathy. Aortic valve replacement present. There is aortic atherosclerosis. There is postoperative change in the lower cervical spine. Bones are osteoporotic. IMPRESSION: No edema or airspace opacity. Heart size within normal limits. Status post aortic valve replacement. Aortic Atherosclerosis (ICD10-I70.0). Electronically Signed   By: Lowella Grip III M.D.   On: 11/26/2020 14:30   CT Angio Abd/Pel w/ and/or w/o  Result Date: 11/28/2020 CLINICAL DATA:  Lower GI bleed, rectal bleeding and diarrhea for 5 days EXAM: CTA ABDOMEN AND PELVIS WITHOUT AND WITH CONTRAST TECHNIQUE: Multidetector CT imaging of the abdomen and pelvis was performed using the standard protocol during bolus administration of intravenous contrast. Multiplanar reconstructed images and MIPs were obtained and reviewed to evaluate the vascular anatomy. CONTRAST:  1m OMNIPAQUE IOHEXOL 350 MG/ML SOLN COMPARISON:  04/28/2020 FINDINGS: VASCULAR Aorta: Normal caliber aorta without aneurysm, dissection, vasculitis or significant stenosis. Severe aortic atherosclerosis. Celiac: Patent without evidence of aneurysm, dissection, vasculitis or significant stenosis. SMA:  Patent without evidence of aneurysm, dissection, vasculitis or significant stenosis. Renals: Both renal arteries are patent without evidence of aneurysm, dissection, vasculitis, fibromuscular dysplasia or significant stenosis. IMA: Atherosclerosis at the origin.  The vessel is patent. Inflow: Patent without evidence of aneurysm, dissection, vasculitis or significant stenosis. Proximal Outflow: Bilateral common femoral and visualized portions of the superficial and profunda femoral arteries are patent without evidence of aneurysm, dissection, vasculitis or significant stenosis. Veins: No venous abnormality. Review of the MIP images confirms the above findings. NON-VASCULAR Lower chest: No acute abnormality. Hepatobiliary: No focal liver abnormality is seen. Large gallstone near the gallbladder neck. No gallbladder wall thickening, or biliary dilatation. Pancreas: Unremarkable. No pancreatic ductal dilatation or surrounding inflammatory changes. Spleen: Normal in size without focal abnormality. Adrenals/Urinary Tract: Adrenal glands are unremarkable. Kidneys are normal, without renal calculi, solid lesion, or hydronephrosis. There is a hyperdense, nonenhancing partially exophytic hemorrhagic or proteinaceous cyst of the lateral midportion of the right kidney (series 4, image 31). Bladder is unremarkable. Stomach/Bowel: Stomach is within normal limits. Appendix appears normal. Sigmoid diverticulosis. Metallic clip in the proximal sigmoid. Lymphatic: No significant vascular findings are present. No enlarged abdominal or pelvic lymph nodes. Reproductive: Uterus and bilateral adnexa are unremarkable. Other: There is an intermediate attenuation fluid collection in the right groin overlying the common iliac vessels measuring in total approximately 4.0 x 2.2 cm (series 4, image 72). No abdominal wall hernia or abnormality. No abdominopelvic ascites. Musculoskeletal: No acute or significant osseous findings. Status post right  hip total arthroplasty, streak artifact limiting evaluation of the pelvis. IMPRESSION: 1. Metallic streak artifact from right hip total arthroplasty and metallic clip within the sigmoid colon somewhat limit evaluation, however there is no evidence of intraluminal contrast or other findings to localize source of GI bleeding. 2. There is an intermediate attenuation fluid collection the right groin overlying the common iliac vessels measuring in total approximately 4.0 x 2.2 cm, which may reflect hematoma,  seroma, or alternately pseudoaneurysm following femoral vascular puncture. Metallic streak artifact from adjacent hip arthroplasty substantially limits evaluation for contrast enhancement. Doppler ultrasound may be used to exclude pseudoaneurysm if there is clinical concern. 3. There is otherwise aortic and branch vessel atherosclerosis without aneurysm, dissection, or other acute pathology. 4. Sigmoid diverticulosis without evidence of diverticulitis. 5. Cholelithiasis without evidence of cholecystitis. Aortic Atherosclerosis (ICD10-I70.0). Electronically Signed   By: Eddie Candle M.D.   On: 11/28/2020 14:03     Microbiology: Recent Results (from the past 240 hour(s))  Resp Panel by RT-PCR (Flu A&B, Covid) Nasopharyngeal Swab     Status: None   Collection Time: 11/26/20  2:02 PM   Specimen: Nasopharyngeal Swab; Nasopharyngeal(NP) swabs in vial transport medium  Result Value Ref Range Status   SARS Coronavirus 2 by RT PCR NEGATIVE NEGATIVE Final    Comment: (NOTE) SARS-CoV-2 target nucleic acids are NOT DETECTED.  The SARS-CoV-2 RNA is generally detectable in upper respiratory specimens during the acute phase of infection. The lowest concentration of SARS-CoV-2 viral copies this assay can detect is 138 copies/mL. A negative result does not preclude SARS-Cov-2 infection and should not be used as the sole basis for treatment or other patient management decisions. A negative result may occur with   improper specimen collection/handling, submission of specimen other than nasopharyngeal swab, presence of viral mutation(s) within the areas targeted by this assay, and inadequate number of viral copies(<138 copies/mL). A negative result must be combined with clinical observations, patient history, and epidemiological information. The expected result is Negative.  Fact Sheet for Patients:  EntrepreneurPulse.com.au  Fact Sheet for Healthcare Providers:  IncredibleEmployment.be  This test is no t yet approved or cleared by the [place] FDA and  has been authorized for detection and/or diagnosis of SARS-CoV-2 by FDA under an Emergency Use Authorization (EUA). This EUA will remain  in effect (meaning this test can be used) for the duration of the COVID-19 declaration under Section 564(b)(1) of the Act, 21 U.S.C.section 360bbb-3(b)(1), unless the authorization is terminated  or revoked sooner.       Influenza A by PCR NEGATIVE NEGATIVE Final   Influenza B by PCR NEGATIVE NEGATIVE Final    Comment: (NOTE) The Xpert Xpress SARS-CoV-2/FLU/RSV plus assay is intended as an aid in the diagnosis of influenza from Nasopharyngeal swab specimens and should not be used as a sole basis for treatment. Nasal washings and aspirates are unacceptable for Xpert Xpress SARS-CoV-2/FLU/RSV testing.  Fact Sheet for Patients: EntrepreneurPulse.com.au  Fact Sheet for Healthcare Providers: IncredibleEmployment.be  This test is not yet approved or cleared by the [place] FDA and has been authorized for detection and/or diagnosis of SARS-CoV-2 by FDA under an Emergency Use Authorization (EUA). This EUA will remain in effect (meaning this test can be used) for the duration of the COVID-19 declaration under Section 564(b)(1) of the Act, 21 U.S.C. section 360bbb-3(b)(1), unless the authorization is terminated  or revoked.  Performed at [facility], [address], [place], [place] [address]      Labs: Basic Metabolic Panel: Recent Labs  Lab 11/26/20 1154 11/27/20 0538 11/28/20 0526 11/29/20 0553  NA 137 140 140 141  K 3.7 3.6 3.7 4.0  CL 103 110 108 108  CO2 22 22 23 24   GLUCOSE 209* 108* 110* 106*  BUN 22 16 9  7*  CREATININE 1.15* 1.00 0.92 0.96  CALCIUM 8.8* 8.4* 8.4* 8.7*   Liver Function Tests: Recent Labs  Lab 11/26/20 1154  AST 35  ALT 28  ALKPHOS 65  BILITOT 0.9  PROT 6.3*  ALBUMIN 3.4*   No results for input(s): LIPASE, AMYLASE in the last 168 hours. No results for input(s): AMMONIA in the last 168 hours. CBC: Recent Labs  Lab 11/26/20 1154 11/27/20 0538 11/28/20 0526 11/29/20 0553 11/30/20 0603  WBC 6.3 6.2 7.9 5.7 5.9  NEUTROABS 4.0  --   --   --   --   HGB 7.9* 8.8* 8.6* 9.0* 9.3*  HCT 25.6* 27.2* 27.3* 29.5* 29.9*  MCV 87.1 86.3 87.8 89.4 89.0  PLT 229 201 208 224 236   Cardiac Enzymes: No results for input(s): CKTOTAL, CKMB, CKMBINDEX, TROPONINI in the last 168 hours. BNP: Invalid input(s): POCBNP CBG: Recent Labs  Lab 11/29/20 0736 11/29/20 1110 11/29/20 1605 11/29/20 2005 11/30/20 0722  GLUCAP 108* 115* 108* 172* 115*    Time coordinating discharge:  36 minutes  Signed:  Orson Eva, DO [facility] Pager: [phone] 11/30/2020, 9:46 AM

## 2020-11-30 ENCOUNTER — Telehealth: Payer: Self-pay | Admitting: Gastroenterology

## 2020-11-30 ENCOUNTER — Encounter: Payer: Self-pay | Admitting: Internal Medicine

## 2020-11-30 ENCOUNTER — Ambulatory Visit: Payer: Medicare Other | Admitting: Cardiology

## 2020-11-30 DIAGNOSIS — Z952 Presence of prosthetic heart valve: Secondary | ICD-10-CM | POA: Diagnosis not present

## 2020-11-30 DIAGNOSIS — D62 Acute posthemorrhagic anemia: Secondary | ICD-10-CM | POA: Diagnosis not present

## 2020-11-30 DIAGNOSIS — K922 Gastrointestinal hemorrhage, unspecified: Secondary | ICD-10-CM | POA: Diagnosis not present

## 2020-11-30 DIAGNOSIS — I10 Essential (primary) hypertension: Secondary | ICD-10-CM | POA: Diagnosis not present

## 2020-11-30 LAB — CBC
HCT: 29.9 % — ABNORMAL LOW (ref 36.0–46.0)
Hemoglobin: 9.3 g/dL — ABNORMAL LOW (ref 12.0–15.0)
MCH: 27.7 pg (ref 26.0–34.0)
MCHC: 31.1 g/dL (ref 30.0–36.0)
MCV: 89 fL (ref 80.0–100.0)
Platelets: 236 K/uL (ref 150–400)
RBC: 3.36 MIL/uL — ABNORMAL LOW (ref 3.87–5.11)
RDW: 14.7 % (ref 11.5–15.5)
WBC: 5.9 K/uL (ref 4.0–10.5)
nRBC: 0 % (ref 0.0–0.2)

## 2020-11-30 LAB — GLUCOSE, CAPILLARY
Glucose-Capillary: 115 mg/dL — ABNORMAL HIGH (ref 70–99)
Glucose-Capillary: 201 mg/dL — ABNORMAL HIGH (ref 70–99)

## 2020-11-30 NOTE — Plan of Care (Signed)

## 2020-11-30 NOTE — Telephone Encounter (Signed)
Denise Parrish, patient needs hospital follow-up in 2-4 weeks. Dx: Anemia, melena  [name], patient also needs repeat CBC in 1 week. Please arrange.

## 2020-11-30 NOTE — Evaluation (Signed)
Physical Therapy Evaluation Patient Details Name: Denise Parrish MRN: [id number] DOB: Oct 18, 1922 Today's Date: 11/30/2020   History of Present Illness  Denise Parrish is a 87 y.o. female with medical history of aortic stenosis status post TAVR 09/28/2020, coronary artery disease status post DES to left circumflex 09/08/2020, diabetes mellitus type 2, hypertension, hyperlipidemia, iron deficiency anemia, stroke, GI bleed, mild cognitive impairment, and diastolic CHF presenting with 3-day history of maroon stools progressing to melanotic stools.  The patient denies any fevers, chills, chest pain, shortness breath, dizziness, nausea, vomiting, diarrhea, abdominal pain, dysuria, hematuria.  There is no hematemesis.    Notably, the patient was started on Plavix monotherapy after her TAVR on 09/28/2020.  She has continued on Plavix uninterrupted since that time.  Her postoperative course was complicated by dissection of her right femoral artery.  This was repaired by vascular surgery.  She has not had any problems until recently.  However, the patient had a complicated hospital admission from 04/26/2020 to 05/07/2020 for acute blood loss anemia where she had an extensive work-up.  During that hospitalization, the patient underwent EGD which showed no active bleeding and one gastric polyp which was removed.  She underwent a capsule endoscopy which was a limited study as the capsule remained in the stomach for more than 11 hours.  It did not show any active bleeding.  A tagged red blood cell scan on 04/29/2020 was negative for any active bleed.  She was transfused 2 units PRBC at that time.  In the emergency department, the patient was afebrile and hemodynamically stable.  BMP showed a sodium 137, potassium 3.7, serum creatinine 1.5.  LFTs were unremarkable.  WBC 6.3, hemoglobin 7.9, platelets 229,000.  Chest x-ray was negative for any acute findings.  EKG shows sinus rhythm with nonspecific ST-T wave changes.     Clinical Impression  Patient functioning at baseline for functional mobility and gait.  Plan:  Patient discharged from physical therapy to care of nursing for ambulation daily as tolerated for length of stay.      Follow Up Recommendations No PT follow up    Equipment Recommendations  None recommended by PT    Recommendations for Other Services       Precautions / Restrictions Precautions Precautions: None Restrictions Weight Bearing Restrictions: No      Mobility  Bed Mobility Overal bed mobility: Modified Independent                  Transfers Overall transfer level: Modified independent                  Ambulation/Gait Ambulation/Gait assistance: Modified independent (Device/Increase time) Gait Distance (Feet): 100 Feet Assistive device: None Gait Pattern/deviations: WFL(Within Functional Limits) Gait velocity: decreased   General Gait Details: demonstrates good return for ambulation in room and hallway without loss of balance  Stairs            Wheelchair Mobility    Modified Rankin (Stroke Patients Only)       Balance Overall balance assessment: No apparent balance deficits (not formally assessed)                                           Pertinent Vitals/Pain Pain Assessment: No/denies pain    Home Living Family/patient expects to be discharged to:: Private residence Living Arrangements: Alone Available Help at Discharge: Family;Available 24  hours/day Type of Home: House Home Access: Stairs to enter Entrance Stairs-Rails: Right;Left;Can reach both Entrance Stairs-Number of Steps: 4-5 Home Layout: One level Home Equipment: Other (comment);Walker - 2 wheels;Cane - quad;Wheelchair - manual;Bedside commode;Walker - 4 wheels Additional Comments: Pt sponge bathes at baseline    Prior Function Level of Independence: Needs assistance   Gait / Transfers Assistance Needed: household ambulator without AD, uses SPC  for longer distances  ADL's / Homemaking Assistance Needed: family assist with community ADL's        Hand Dominance   Dominant Hand: Right    Extremity/Trunk Assessment   Upper Extremity Assessment Upper Extremity Assessment: Overall WFL for tasks assessed    Lower Extremity Assessment Lower Extremity Assessment: Overall WFL for tasks assessed    Cervical / Trunk Assessment Cervical / Trunk Assessment: Normal  Communication   Communication: HOH  Cognition Arousal/Alertness: Awake/alert Behavior During Therapy: WFL for tasks assessed/performed Overall Cognitive Status: Within Functional Limits for tasks assessed                                        General Comments      Exercises     Assessment/Plan    PT Assessment Patent does not need any further PT services  PT Problem List         PT Treatment Interventions      PT Goals (Current goals can be found in the Care Plan section)  Acute Rehab PT Goals Patient Stated Goal: return home with family to assist PT Goal Formulation: With patient Time For Goal Achievement: 11/30/20 Potential to Achieve Goals: Good    Frequency     Barriers to discharge        Co-evaluation               AM-PAC PT "6 Clicks" Mobility  Outcome Measure Help needed turning from your back to your side while in a flat bed without using bedrails?: None Help needed moving from lying on your back to sitting on the side of a flat bed without using bedrails?: None Help needed moving to and from a bed to a chair (including a wheelchair)?: None Help needed standing up from a chair using your arms (e.g., wheelchair or bedside chair)?: None Help needed to walk in hospital room?: None Help needed climbing 3-5 steps with a railing? : None 6 Click Score: 24    End of Session   Activity Tolerance: Patient tolerated treatment well Patient left: in chair;with call bell/phone within reach Nurse Communication:  Mobility status PT Visit Diagnosis: Unsteadiness on feet (R26.81);Other abnormalities of gait and mobility (R26.89);Muscle weakness (generalized) (M62.81)    Time: 9562-1308 PT Time Calculation (min) (ACUTE ONLY): 15 min   Charges:   PT Evaluation $PT Eval Low Complexity: 1 Low PT Treatments $Therapeutic Activity: 8-22 mins        11:33 AM, 11/30/20 [name], MPT Physical Therapist with [facility] [phone] office [phone] mobile phone

## 2020-11-30 NOTE — Progress Notes (Signed)
Subjective: Patient had her first bowel movement this morning since having her colonoscopy on Saturday.  Patient states her stool is dark.  She has toilet tissue with stool on the tissue that she shows me today.  Stool looks brown to me with a few darker specks of stool.  Patient is convinced there is blood in the stool.  She is very worried about going home today.  States she will be back before the weekend.  She is not comfortable with being discharged.   Denies nausea, vomiting, abdominal pain, lightheadedness, dizziness.  Tolerating her diet well.  Objective: Vital signs in last 24 hours: Temp:  [98 F (36.7 C)-98.7 F (37.1 C)] 98.7 F (37.1 C) (03/15 0605) Pulse Rate:  [65-80] 71 (03/15 0605) Resp:  [16-18] 16 (03/15 0605) BP: (117-137)/(46-52) 126/46 (03/15 0605) SpO2:  [95 %-99 %] 95 % (03/15 0605) Last BM Date: 11/28/20 General:   Alert and oriented, pleasant Head:  Normocephalic and atraumatic. Eyes:  No icterus, sclera clear. Conjuctiva pink.  Abdomen:  Bowel sounds present, soft, non-tender, non-distended. No HSM or hernias noted. No rebound or guarding. No masses appreciated.  Msk:  Symmetrical without gross deformities. Normal posture. Extremities:  Without edema. Neurologic:  Alert and  oriented x4;  grossly normal neurologically. Psych: Normal mood and affect.  Intake/Output from previous day: 03/14 0701 - 03/15 0700 In: 1440 [P.O.:1440] Out: 700 [Urine:700] Intake/Output this shift: Total I/O In: 240 [P.O.:240] Out: -   Lab Results: Recent Labs    11/28/20 0526 11/29/20 0553 11/30/20 0603  WBC 7.9 5.7 5.9  HGB 8.6* 9.0* 9.3*  HCT 27.3* 29.5* 29.9*  PLT 208 224 236   BMET Recent Labs    11/28/20 0526 11/29/20 0553  NA 140 141  K 3.7 4.0  CL 108 108  CO2 23 24  GLUCOSE 110* 106*  BUN 9 7*  CREATININE 0.92 0.96  CALCIUM 8.4* 8.7*    Studies/Results: CT Angio Abd/Pel w/ and/or w/o  Result Date: 11/28/2020 CLINICAL DATA:  Lower GI  bleed, rectal bleeding and diarrhea for 5 days EXAM: CTA ABDOMEN AND PELVIS WITHOUT AND WITH CONTRAST TECHNIQUE: Multidetector CT imaging of the abdomen and pelvis was performed using the standard protocol during bolus administration of intravenous contrast. Multiplanar reconstructed images and MIPs were obtained and reviewed to evaluate the vascular anatomy. CONTRAST:  33m OMNIPAQUE IOHEXOL 350 MG/ML SOLN COMPARISON:  04/28/2020 FINDINGS: VASCULAR Aorta: Normal caliber aorta without aneurysm, dissection, vasculitis or significant stenosis. Severe aortic atherosclerosis. Celiac: Patent without evidence of aneurysm, dissection, vasculitis or significant stenosis. SMA: Patent without evidence of aneurysm, dissection, vasculitis or significant stenosis. Renals: Both renal arteries are patent without evidence of aneurysm, dissection, vasculitis, fibromuscular dysplasia or significant stenosis. IMA: Atherosclerosis at the origin.  The vessel is patent. Inflow: Patent without evidence of aneurysm, dissection, vasculitis or significant stenosis. Proximal Outflow: Bilateral common femoral and visualized portions of the superficial and profunda femoral arteries are patent without evidence of aneurysm, dissection, vasculitis or significant stenosis. Veins: No venous abnormality. Review of the MIP images confirms the above findings. NON-VASCULAR Lower chest: No acute abnormality. Hepatobiliary: No focal liver abnormality is seen. Large gallstone near the gallbladder neck. No gallbladder wall thickening, or biliary dilatation. Pancreas: Unremarkable. No pancreatic ductal dilatation or surrounding inflammatory changes. Spleen: Normal in size without focal abnormality. Adrenals/Urinary Tract: Adrenal glands are unremarkable. Kidneys are normal, without renal calculi, solid lesion, or hydronephrosis. There is a hyperdense, nonenhancing partially exophytic hemorrhagic or proteinaceous cyst of  the lateral midportion of the right  kidney (series 4, image 31). Bladder is unremarkable. Stomach/Bowel: Stomach is within normal limits. Appendix appears normal. Sigmoid diverticulosis. Metallic clip in the proximal sigmoid. Lymphatic: No significant vascular findings are present. No enlarged abdominal or pelvic lymph nodes. Reproductive: Uterus and bilateral adnexa are unremarkable. Other: There is an intermediate attenuation fluid collection in the right groin overlying the common iliac vessels measuring in total approximately 4.0 x 2.2 cm (series 4, image 72). No abdominal wall hernia or abnormality. No abdominopelvic ascites. Musculoskeletal: No acute or significant osseous findings. Status post right hip total arthroplasty, streak artifact limiting evaluation of the pelvis. IMPRESSION: 1. Metallic streak artifact from right hip total arthroplasty and metallic clip within the sigmoid colon somewhat limit evaluation, however there is no evidence of intraluminal contrast or other findings to localize source of GI bleeding. 2. There is an intermediate attenuation fluid collection the right groin overlying the common iliac vessels measuring in total approximately 4.0 x 2.2 cm, which may reflect hematoma, seroma, or alternately pseudoaneurysm following femoral vascular puncture. Metallic streak artifact from adjacent hip arthroplasty substantially limits evaluation for contrast enhancement. Doppler ultrasound may be used to exclude pseudoaneurysm if there is clinical concern. 3. There is otherwise aortic and branch vessel atherosclerosis without aneurysm, dissection, or other acute pathology. 4. Sigmoid diverticulosis without evidence of diverticulitis. 5. Cholelithiasis without evidence of cholecystitis. Aortic Atherosclerosis (ICD10-I70.0). Electronically Signed   By: Eddie Candle M.D.   On: 11/28/2020 14:03    Assessment: 87 year old female with past medical history of diabetes, carotid artery stenosis, emphysema, GERD, CVA, hypertension, mild  cognitive impairment, severe aortic stenosis status post TAVR, coronary artery disease status post stent placement in December 2021 on Plavix, prior history of gastrointestinal bleeding, small bowel lesion of unknown significance found in 2013, presenting with symptomatic anemia and melena. Received 1 unit PRBCs this admission.   Most recent GI bleed prior to this admission in Aug 2021 with EGD at that time unrevealing, GI bleeding scan negative. This admission, she underwent both EGD and colonoscopy, with EGD showing findings suggestive of short segment Barrett's, normal stomach and small bowel, and colonoscopy with diverticulosis but no active bleeding. 2 polyps were not removed as she was on Plavix. Capsule study then completed with questionable nodule/small mass in distal small bowel but without ulceration. Evidence of hematochezia throughout colon but no fresh blood. Query Dieulafoy lesion as culprit for obscure GI bleeding vs small bowel nodule. CTA completed without source for bleeding but did note fluid collection in right groin overlying common iliac vessels that could reflect a hematoma, seroma, or pseudoaneurysm, which was reviewed by Dr. Carles Collet with Vascular and recommended non-operative management.   There was consideration of repeat colonoscopy to determine if small bowel nodule could be reached. Additionally, it persistent clinical bleeding, would need to consider retrograde double-balloon enteroscopy.  However, as hemoglobin remained stable and is improving, no additional procedures have been pursued. Plavix was resumed yesterday and diet advanced to soft.  Hemoglobin stable this morning at 9.3, up from 9.0 yesterday. Patient reports having a dark stool this morning which is her first BM since colonoscopy 3 days ago.  Patient shows me toilet tissue with brown stool and a couple tiny darker specks.  She is convinced she has bleeding and is uncomfortable with discharging today.  States she will be  back before the weekend.  We discussed that this is likely washout from prior bleeding as her hemoglobin is stable and improving.  Discussed the possibility of discharging late this evening or possibly staying to tomorrow morning and discharging at that time if hemoglobin remains stable. This would allow [place] to monitor for a full 24 hours since starting Plavix. She is ok with this. Would not pursue additional procedure at this time.   Plan: 1.  Advance to regular, carb modified diet.  2.  Continue PPI daily. 3.  Continue to follow H/H. 4.  If hemoglobin remains stable and no further overt GI bleeding on Plavix, anticipate discharge either late this evening or tomorrow morning.  5.  Avoid all NSAIDs.  6.  Follow-up in our office in the next 2-4 weeks.  7.  Will need CBC 1 week following discharge.  7.  Return to the hospital if any recurrent GI bleeding.     LOS: 2 days    11/30/2020, 9:20 AM   Aliene Altes, [facility]

## 2020-12-01 ENCOUNTER — Encounter (HOSPITAL_COMMUNITY): Payer: Medicare Other

## 2020-12-02 ENCOUNTER — Other Ambulatory Visit: Payer: Self-pay

## 2020-12-02 DIAGNOSIS — K921 Melena: Secondary | ICD-10-CM

## 2020-12-02 DIAGNOSIS — D649 Anemia, unspecified: Secondary | ICD-10-CM

## 2020-12-02 NOTE — Telephone Encounter (Signed)
Spoke with [name] pts daughter. Pt will complete labs next week. Lab orders placed and mailed to pt.

## 2020-12-03 ENCOUNTER — Encounter (HOSPITAL_COMMUNITY): Payer: Medicare Other

## 2020-12-06 ENCOUNTER — Encounter (HOSPITAL_COMMUNITY): Payer: Medicare Other

## 2020-12-07 ENCOUNTER — Telehealth: Payer: Self-pay | Admitting: Internal Medicine

## 2020-12-07 NOTE — Telephone Encounter (Signed)
Spoke with pts daughter. Lab orders were placed previously for [place] lab. I'm not sure why the lab couldn't see them. Lab orders were mailed per pts daughter.

## 2020-12-07 NOTE — Telephone Encounter (Signed)
Patient daughter called and said that she thought she was supposed to take her mom to get labs for this office but the lab had no orders.  Please check and give her a call [phone]

## 2020-12-08 ENCOUNTER — Encounter (HOSPITAL_COMMUNITY): Payer: Medicare Other

## 2020-12-10 ENCOUNTER — Encounter (HOSPITAL_COMMUNITY): Payer: Medicare Other

## 2020-12-10 ENCOUNTER — Ambulatory Visit: Payer: Medicare Other | Admitting: Cardiovascular Disease

## 2020-12-10 LAB — CBC WITH DIFFERENTIAL/PLATELET
Absolute Monocytes: 662 cells/uL (ref 200–950)
Basophils Absolute: 23 cells/uL (ref 0–200)
Basophils Relative: 0.3 %
Eosinophils Absolute: 92 cells/uL (ref 15–500)
Eosinophils Relative: 1.2 %
HCT: 31.3 % — ABNORMAL LOW (ref 35.0–45.0)
Hemoglobin: 9.8 g/dL — ABNORMAL LOW (ref 11.7–15.5)
Lymphs Abs: 1617 cells/uL (ref 850–3900)
MCH: 27.2 pg (ref 27.0–33.0)
MCHC: 31.3 g/dL — ABNORMAL LOW (ref 32.0–36.0)
MCV: 86.9 fL (ref 80.0–100.0)
MPV: 10.3 fL (ref 7.5–12.5)
Monocytes Relative: 8.6 %
Neutro Abs: 5305 cells/uL (ref 1500–7800)
Neutrophils Relative %: 68.9 %
Platelets: 265 10*3/uL (ref 140–400)
RBC: 3.6 10*6/uL — ABNORMAL LOW (ref 3.80–5.10)
RDW: 13.8 % (ref 11.0–15.0)
Total Lymphocyte: 21 %
WBC: 7.7 10*3/uL (ref 3.8–10.8)

## 2020-12-13 ENCOUNTER — Encounter (HOSPITAL_COMMUNITY): Payer: Medicare Other

## 2020-12-15 ENCOUNTER — Encounter (HOSPITAL_COMMUNITY): Payer: Medicare Other

## 2020-12-17 ENCOUNTER — Encounter (HOSPITAL_COMMUNITY): Payer: Medicare Other

## 2020-12-20 ENCOUNTER — Encounter (HOSPITAL_COMMUNITY): Payer: Medicare Other

## 2020-12-22 ENCOUNTER — Encounter (HOSPITAL_COMMUNITY): Payer: Medicare Other

## 2020-12-24 ENCOUNTER — Encounter (HOSPITAL_COMMUNITY): Payer: Medicare Other

## 2020-12-27 ENCOUNTER — Encounter (HOSPITAL_COMMUNITY): Payer: Medicare Other

## 2020-12-29 ENCOUNTER — Encounter (HOSPITAL_COMMUNITY): Payer: Medicare Other

## 2020-12-31 ENCOUNTER — Encounter (HOSPITAL_COMMUNITY): Payer: Medicare Other

## 2021-01-03 ENCOUNTER — Encounter (HOSPITAL_COMMUNITY): Payer: Medicare Other

## 2021-01-05 ENCOUNTER — Encounter (HOSPITAL_COMMUNITY): Payer: Medicare Other

## 2021-01-07 ENCOUNTER — Encounter (HOSPITAL_COMMUNITY): Payer: Medicare Other

## 2021-01-10 ENCOUNTER — Encounter (HOSPITAL_COMMUNITY): Payer: Medicare Other

## 2021-01-12 ENCOUNTER — Ambulatory Visit: Payer: Medicare Other | Admitting: Gastroenterology

## 2021-01-12 ENCOUNTER — Encounter (HOSPITAL_COMMUNITY): Payer: Medicare Other

## 2021-01-14 ENCOUNTER — Encounter (HOSPITAL_COMMUNITY): Payer: Medicare Other

## 2021-01-14 ENCOUNTER — Other Ambulatory Visit: Payer: Self-pay

## 2021-01-14 ENCOUNTER — Ambulatory Visit (INDEPENDENT_AMBULATORY_CARE_PROVIDER_SITE_OTHER): Payer: Medicare Other | Admitting: Physician Assistant

## 2021-01-14 ENCOUNTER — Ambulatory Visit (HOSPITAL_COMMUNITY)
Admission: RE | Admit: 2021-01-14 | Discharge: 2021-01-14 | Disposition: A | Discharge status: Home / Self Care | Payer: Medicare Other | Source: Ambulatory Visit | Attending: Vascular Surgery | Admitting: Vascular Surgery

## 2021-01-14 VITALS — BP 143/65 | HR 58 | Temp 98.2°F | Resp 20 | Ht 65.0 in | Wt 167.2 lb

## 2021-01-14 DIAGNOSIS — I739 Peripheral vascular disease, unspecified: Secondary | ICD-10-CM | POA: Diagnosis present

## 2021-01-14 NOTE — Progress Notes (Signed)
History of Present Illness:  Patient is a 87 y.o. year old female who presents for evaluation of right femoral artery.   Following the procedure she began having pain in the right foot and the PACU nurses were unable to obtain doppler signals in her foot. She did not have any signs of hematoma at the femoral access site. On exam Dr. Scot Dock was unable to feel a right femoral pulse or obtain doppler signals in the right foot. A Perclose device was used on the right femoral artery and he suspected a focal dissection. Subsequently she had to undergo urgent exploration. She underwent right femoral exploration and primary repair of her right common femoral artery by Dr. Donnetta Hutching.   She is here for follow up exam and ABI baseline.  She denise pain or edema in the right groin area.  No edema in right LE.  She denise symptoms of claudication, rest pain and non  Healing wounds.    Past Medical History:  Diagnosis Date  . Anxiety   . Carotid stenosis, right 10/16/2019   75% on CTA neck  . Cholelithiasis    on u/s 09/2011  . Degenerative joint disease    Right THA in 1995; cervical discectomy and fusion-2001  . Diabetes mellitus, type 2 (HCC)    + neuropathy  . Emphysema   . Gastroesophageal reflux disease   . Hiatal hernia   . History of CVA (cerebrovascular accident)   . Hyperlipidemia   . Hypertension   . IDA (iron deficiency anemia)    labs 09/2011  . Mild cognitive impairment   . Obesity   . Osteoporosis   . Pneumonia   . 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 Severe aortic stenosis   . Sigmoid diverticulosis   . Small bowel lesion    On Given's capsule; Dr GKathleene Hazel11/2013, no further w/u needed    Past Surgical History:  Procedure Laterality Date  . ANTERIOR CERVICAL DISCECTOMY  2001   C4-5, allograft, fixation  . BREAST LUMPECTOMY     benign  . CATARACT EXTRACTION W/ INTRAOCULAR  LENS IMPLANT  2007   Left  . COLONOSCOPY  2006  . COLONOSCOPY  04/03/12   Dr. ROk Edwardsdiverticulosis, negative microscopic  colitis   . COLONOSCOPY WITH PROPOFOL N/A 11/27/2020   Procedure: COLONOSCOPY WITH PROPOFOL;  Surgeon: CHarvel Quale MD;  [facility];  Service: Gastroenterology;  Laterality: N/A;  . CORONARY STENT INTERVENTION N/A 09/08/2020   Procedure: CORONARY STENT INTERVENTION;  Surgeon: CSherren Mocha MD;  [facility];  Service: Cardiovascular;  Laterality: N/A;  . ENDARTERECTOMY Right 10/21/2019   Procedure: ENDARTERECTOMY CAROTID RIGHT;  Surgeon: CWaynetta Sandy MD;  [facility] [facility]  Service: Vascular;  Laterality: Right;  . ESOPHAGOGASTRODUODENOSCOPY  04/03/12   Dr. RJennet Madurohernia, chronic gastritis on bx  . ESOPHAGOGASTRODUODENOSCOPY (EGD) WITH PROPOFOL N/A 04/28/2020   Procedure: ESOPHAGOGASTRODUODENOSCOPY (EGD) WITH PROPOFOL;  Surgeon: BOtis Brace MD;  [place] [facility]  Service: Gastroenterology;  Laterality: N/A;  . ESOPHAGOGASTRODUODENOSCOPY (EGD) WITH PROPOFOL N/A 11/27/2020   Procedure: ESOPHAGOGASTRODUODENOSCOPY (EGD) WITH PROPOFOL;  Surgeon: CHarvel Quale MD;  [facility];  Service: Gastroenterology;  Laterality: N/A;  . FEMORAL ARTERY EXPLORATION Right 09/28/2020   Procedure: RIGHT GROIN EXPLORATION WITH REPAIR RIGHT COMMON FEMORAL ARTERY;  Surgeon: ERosetta Posner MD;  [facility] [facility]  Service: Vascular;  Laterality: Right;  . GIVENS CAPSULE STUDY  05/09/2012   RMR: an unclear raised area of small bowel was noted, with features almost  characteristic of very small polyp. This was without villous  blunting or any evidence of active bleeding; yet, the area of  concern appeared to be erythematous. However, this could simply  be a light reflection on a normal variation of the small bowel. REFERRED TO DR. GILLIAM, APPT FOR NOVEMBER 2013.   [name] GIVENS CAPSULE STUDY N/A 04/30/2020    Procedure: GIVENS CAPSULE STUDY;  Surgeon: Otis Brace, MD;  Location: [place];  Service: Gastroenterology;  Laterality: N/A;  . GIVENS CAPSULE STUDY  11/27/2020   Procedure: GIVENS CAPSULE STUDY;  Surgeon: Harvel Quale, MD;  [facility];  Service: Gastroenterology;;  . HIP ARTHROPLASTY Right   . PATCH ANGIOPLASTY Right 10/21/2019   Procedure: PATCH ANGIOPLASTY USING Rueben Bash BIOLOGIC PATCH;  Surgeon: Waynetta Sandy, MD;  Location: [place];  Service: Vascular;  Laterality: Right;  . POLYPECTOMY  04/28/2020   Procedure: POLYPECTOMY;  Surgeon: Otis Brace, MD;  Location: [facility];  Service: Gastroenterology;;  . RIGHT/LEFT HEART CATH AND CORONARY ANGIOGRAPHY N/A 09/08/2020   Procedure: RIGHT/LEFT HEART CATH AND CORONARY ANGIOGRAPHY;  Surgeon: Sherren Mocha, MD;  Location: [facility];  Service: Cardiovascular;  Laterality: N/A;  . TEE WITHOUT CARDIOVERSION N/A 09/28/2020   Procedure: TRANSESOPHAGEAL ECHOCARDIOGRAM (TEE);  Surgeon: Sherren Mocha, MD;  Location: [place];  Service: Open Heart Surgery;  Laterality: N/A;  . TONSILLECTOMY    . TOTAL HIP ARTHROPLASTY  1995   Right  . TRANSCATHETER AORTIC VALVE REPLACEMENT, TRANSFEMORAL N/A 09/28/2020   Procedure: TRANSCATHETER AORTIC VALVE REPLACEMENT, TRANSFEMORAL;  Surgeon: Sherren Mocha, MD;  Location: [place];  Service: Open Heart Surgery;  Laterality: N/A;    ROS:   General:  No weight loss, Fever, chills  HEENT: No recent headaches, no nasal bleeding, no visual changes, no sore throat  Neurologic: No dizziness, blackouts, seizures. No recent symptoms of stroke or mini- stroke. No recent episodes of slurred speech, or temporary blindness.  Cardiac: No recent episodes of chest pain/pressure, no shortness of breath at rest.  No shortness of breath with exertion.  Denies history of atrial fibrillation or irregular heartbeat  Vascular: No history of rest pain in feet.  No history of  claudication.  No history of non-healing ulcer, No history of DVT   Pulmonary: No home oxygen, no productive cough, no hemoptysis,  No asthma or wheezing  Musculoskeletal:  [ ]  Arthritis, [ ]  Low back pain,  [ ]  Joint pain  Hematologic:No history of hypercoagulable state.  No history of easy bleeding.  No history of anemia  Gastrointestinal: No hematochezia or melena,  No gastroesophageal reflux, no trouble swallowing  Urinary: [ ]  chronic Kidney disease, [ ]  on HD - [ ]  MWF or [ ]  TTHS, [ ]  Burning with urination, [ ]  Frequent urination, [ ]  Difficulty urinating;   Skin: No rashes  Psychological: No history of anxiety,  No history of depression  Social History Social History   Tobacco Use  . Smoking status: Never Smoker  . Smokeless tobacco: Never Used  . Tobacco comment: Quit x 50 years  Vaping Use  . Vaping Use: Never used  Substance Use Topics  . Alcohol use: Never  . Drug use: Never    Family History Family History  Problem Relation Age of Onset  . Cirrhosis Mother  no etoh use  . CAD Father   . Diabetes Mother        + brother  . Heart failure Father   . Hypertension Father        + brother  . Cancer Brother        lung, age 19  . Uterine cancer Sister   . Colon cancer Neg Hx     Allergies  No Known Allergies   Current Outpatient Medications  Medication Sig Dispense Refill  . ALPRAZolam (XANAX) 0.5 MG tablet Take 0.5 mg by mouth at bedtime as needed for anxiety.    . Ascorbic Acid (VITAMIN C PO) Take 1 tablet by mouth daily.    [name] atorvastatin (LIPITOR) 40 MG tablet Take 1 tablet (40 mg total) by mouth daily at 6 PM. 30 tablet 2  . cholecalciferol (VITAMIN D) 25 MCG (1000 UNIT) tablet Take 1,000 Units by mouth daily.    . clopidogrel (PLAVIX) 75 MG tablet Take 1 tablet (75 mg total) by mouth daily with breakfast. 90 tablet 1  . glimepiride (AMARYL) 1 MG tablet Take 1 mg by mouth daily with breakfast.    . Glycerin-Hypromellose-PEG 400 (VISINE DRY  EYE OP) Apply 1 drop to eye 3 (three) times daily as needed (for dry eye relief).     . LUTEIN PO Take 1 capsule by mouth daily.    . meclizine (ANTIVERT) 12.5 MG tablet Take 1 tablet (12.5 mg total) by mouth 3 (three) times daily as needed for dizziness. 20 tablet 0  . metoprolol tartrate (LOPRESSOR) 25 MG tablet Take 0.5 tablets (12.5 mg total) by mouth 2 (two) times daily. 30 tablet 0  . Multiple Vitamins-Minerals (PRESERVISION AREDS 2 PO) Take 1 tablet by mouth 2 (two) times daily. Takes 1 tablet in the morning and 1 tablet at night for eyes.    . pantoprazole (PROTONIX) 40 MG tablet Take 1 tablet (40 mg total) by mouth daily. 90 tablet 3   No current facility-administered medications for this visit.    Physical Examination  Vitals:   01/14/21 1432  BP: (!) 143/65  Pulse: (!) 58  Resp: 20  Temp: 98.2 F (36.8 C)  TempSrc: Temporal  SpO2: 99%  Weight: 167 lb 3.2 oz (75.8 kg)  Height: 5' 5"  (1.651 m)    Body mass index is 27.82 kg/m.  General:  Alert and oriented, no acute distress HEENT: Normal Neck: No bruit or JVD Pulmonary: Clear to auscultation bilaterally Cardiac: Regular Rate and Rhythm without murmur Abdomen: Soft, non-tender, non-distended, no mass, no scars Skin: No rash, right groin is soft with well healed incision. Extremity Pulses:  2+ radial,  femoral, dorsalis pedis pulses bilaterally Musculoskeletal: No deformity or edema  Neurologic: Upper and lower extremity motor  and symmetric  DATA:     ABI Findings:  +---------+------------------+-----+---------+--------+  Right  Rt Pressure (mmHg)IndexWaveform Comment   +---------+------------------+-----+---------+--------+  Brachial 192                      +---------+------------------+-----+---------+--------+  ATA   158        0.82            +---------+------------------+-----+---------+--------+  PTA   151        0.79 triphasic       +---------+------------------+-----+---------+--------+  DP                biphasic       +---------+------------------+-----+---------+--------+  Netta Neat  0.42            +---------+------------------+-----+---------+--------+   +---------+------------------+-----+----------+-------+  Left   Lt Pressure (mmHg)IndexWaveform Comment  +---------+------------------+-----+----------+-------+  Brachial 185                      +---------+------------------+-----+----------+-------+  ATA   168        0.88            +---------+------------------+-----+----------+-------+  PTA   139        0.72 monophasic      +---------+------------------+-----+----------+-------+  DP                biphasic       +---------+------------------+-----+----------+-------+  Great Toe82        0.43            +---------+------------------+-----+----------+-------+   +-------+-----------+-----------+------------+------------+  ABI/TBIToday's ABIToday's TBIPrevious ABIPrevious TBI  +-------+-----------+-----------+------------+------------+  Right 0.82    0.42    0.75    0.30      +-------+-----------+-----------+------------+------------+  Left  0.88    0.43    0.92    0.22      +-------+-----------+-----------+------------+------------+        Bilateral ABIs appear essentially unchanged compared to prior study on  10/15/2020.    Summary:  Right: Resting right ankle-brachial index indicates mild right lower  extremity arterial disease. The right toe-brachial index is abnormal. RT  great toe pressure = 80 mmHg.   Left: Resting left ankle-brachial index indicates mild left lower  extremity arterial disease. The left toe-brachial index is abnormal. LT  Great toe pressure = 82  mmHg.   ASSESSMENT/PLAN:  S/P TVAR with right femoral artery  exploration and primary repair of her right common femoral artery by Dr. Donnetta Hutching.  Her inflow of the right LE is stable with palpable pedal pulses  Her ABI's are stable     Roxy Horseman PA-C [facility] Office: [phone]  MD on call Trula Slade

## 2021-01-17 ENCOUNTER — Encounter (HOSPITAL_COMMUNITY): Payer: Medicare Other

## 2021-01-18 NOTE — Progress Notes (Signed)
Referring Provider: [name], MD Primary Care Physician:  [name], MD Primary GI Physician: Dr. Gala Romney  Chief Complaint  Patient presents with  . Anemia    Doing ok    HPI:   Denise Parrish is a 87 y.o. female with past medical history of diabetes, carotid artery stenosis, emphysema, GERD, CVA, hypertension, mild cognitive impairment, severe aortic stenosis status post TAVR, coronary artery disease status post stent placement in December 2021 on Plavix, priorhistory of gastrointestinal bleeding, small bowel lesion of unknown significance found in 2013,and hospitalized in March 2022 with symptomatic anemia and melena presenting today for hospital follow-up.   Patient was hospitalized 11/26/2020 - 11/30/2020 with symptomatic anemia and melena.  Hemoglobin was 7.9 on admission.  Received 1 unit PRBCs with appropriate response. She underwent EGD and colonoscopy during this admission.  EGD with findings suggestive of short segment Barrett's, normal stomach, and small bowel.  Colonoscopy with diverticulosis, but no active bleeding.  Two 2-4 mm polyps were not removed as she was on Plavix.  Capsule study was completed with questionable nodule/small mass in the distal small bowel but without ulceration.  Evidence of hematochezia throughout the colon but no fresh blood.  Queried Dieulafoy lesion as culprit for obscure GI bleeding vs small bowel nodule. CTA completed without source for bleeding but did note fluid collection in right groin overlying common iliac vessels that could reflect a hematoma, seroma, or pseudoaneurysm, which was reviewed by Dr. Carles Collet with Vascular and recommended non-operative management. There was consideration of repeat colonoscopy to determine if small bowel nodule could be reached. Additionally, it persistent clinical bleeding, would need to consider retrograde double-balloon enteroscopy.  However, as hemoglobin remained stable and was improving, no additional  procedure were pursued. Hemoglobin 9.3 on day of discharge. She was discharged on PPI daily. Requested CBC in 1 week.   CBC 3/24 with hemoglobin up to 9.8.  Today she states she is doing well.  Denies BRBPR or melena.  Maintains on Protonix 40 mg daily which keeps GERD well controlled.  Denies dysphagia, nausea, vomiting, abdominal pain, constipation, or diarrhea.  Eating well.  No other concerns for me today.  Past Medical History:  Diagnosis Date  . Anxiety   . Carotid stenosis, right 10/16/2019   75% on CTA neck  . Cholelithiasis    on u/s 09/2011  . Degenerative joint disease    Right THA in 1995; cervical discectomy and fusion-2001  . Diabetes mellitus, type 2 (HCC)    + neuropathy  . Emphysema   . Gastroesophageal reflux disease   . Hiatal hernia   . History of CVA (cerebrovascular accident)   . Hyperlipidemia   . Hypertension   . IDA (iron deficiency anemia)    labs 09/2011  . Mild cognitive impairment   . Obesity   . Osteoporosis   . Pneumonia   . 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   s/p TAVR with a 59m Medtronic Evolut Pro+ via the TF approach   . Severe aortic stenosis   . Sigmoid diverticulosis   . Small bowel lesion    On Given's capsule; Dr GKathleene Hazel11/2013, no further w/u needed    Past Surgical History:  Procedure Laterality Date  . ANTERIOR CERVICAL DISCECTOMY  2001   C4-5, allograft, fixation  . BREAST LUMPECTOMY     benign  . CATARACT EXTRACTION W/ INTRAOCULAR LENS IMPLANT  2007   Left  . COLONOSCOPY  2006  . COLONOSCOPY  04/03/12  Dr. Ok Edwards diverticulosis, negative microscopic  colitis   . COLONOSCOPY WITH PROPOFOL N/A 11/27/2020   Surgeon: Montez Morita, Quillian Quince, MD;  diverticulosis, no active bleeding.  Two 2-4 mm polyps not removed in the setting of Plavix.  . CORONARY STENT INTERVENTION N/A 09/08/2020   Procedure: CORONARY STENT INTERVENTION;  Surgeon: Sherren Mocha, MD;  Location: [facility];  Service: Cardiovascular;  Laterality: N/A;  . ENDARTERECTOMY Right 10/21/2019   Procedure: ENDARTERECTOMY CAROTID RIGHT;  Surgeon: Waynetta Sandy, MD;  Location: [facility];  Service: Vascular;  Laterality: Right;  . ESOPHAGOGASTRODUODENOSCOPY  04/03/12   Dr. Jennet Maduro hernia, chronic gastritis on bx  . ESOPHAGOGASTRODUODENOSCOPY (EGD) WITH PROPOFOL N/A 04/28/2020   Procedure: ESOPHAGOGASTRODUODENOSCOPY (EGD) WITH PROPOFOL;  Surgeon: Otis Brace, MD;  Location: [facility];  Service: Gastroenterology;  Laterality: N/A;  . ESOPHAGOGASTRODUODENOSCOPY (EGD) WITH PROPOFOL N/A 11/27/2020   Surgeon: Montez Morita, Quillian Quince, MD; findings suggestive of short segment Barrett's, normal stomach, and small bowel.  . FEMORAL ARTERY EXPLORATION Right 09/28/2020   Procedure: RIGHT GROIN EXPLORATION WITH REPAIR RIGHT COMMON FEMORAL ARTERY;  Surgeon: [name], MD;  Location: [facility];  Service: Vascular;  Laterality: Right;  . GIVENS CAPSULE STUDY  05/09/2012   RMR: an unclear raised area of small bowel was noted, with features almost  characteristic of very small polyp. This was without villous  blunting or any evidence of active bleeding; yet, the area of  concern appeared to be erythematous. However, this could simply  be a light reflection on a normal variation of the small bowel. REFERRED TO DR. GILLIAM, APPT FOR NOVEMBER 2013.   [name] GIVENS CAPSULE STUDY N/A 04/30/2020   Procedure: GIVENS CAPSULE STUDY;  Surgeon: Otis Brace, MD;  Location: [facility];  Service: Gastroenterology;  Laterality: N/A;  . GIVENS CAPSULE STUDY  11/27/2020    Surgeon: Montez Morita, Quillian Quince, MD;  questionable nodule/small mass in the distal small bowel but without ulceration.  Evidence of hematochezia throughout the colon but no fresh blood.   [name] HIP ARTHROPLASTY Right   . PATCH ANGIOPLASTY Right 10/21/2019   Procedure: PATCH ANGIOPLASTY USING Rueben Bash BIOLOGIC PATCH;  Surgeon: Waynetta Sandy, MD;   Location: [facility];  Service: Vascular;  Laterality: Right;  . POLYPECTOMY  04/28/2020   Procedure: POLYPECTOMY;  Surgeon: Otis Brace, MD;  Location: [facility];  Service: Gastroenterology;;  . RIGHT/LEFT HEART CATH AND CORONARY ANGIOGRAPHY N/A 09/08/2020   Procedure: RIGHT/LEFT HEART CATH AND CORONARY ANGIOGRAPHY;  Surgeon: Sherren Mocha, MD;  Location: [facility];  Service: Cardiovascular;  Laterality: N/A;  . TEE WITHOUT CARDIOVERSION N/A 09/28/2020   Procedure: TRANSESOPHAGEAL ECHOCARDIOGRAM (TEE);  Surgeon: Sherren Mocha, MD;  Location: [facility];  Service: Open Heart Surgery;  Laterality: N/A;  . TONSILLECTOMY    . TOTAL HIP ARTHROPLASTY  1995   Right  . TRANSCATHETER AORTIC VALVE REPLACEMENT, TRANSFEMORAL N/A 09/28/2020   Procedure: TRANSCATHETER AORTIC VALVE REPLACEMENT, TRANSFEMORAL;  Surgeon: Sherren Mocha, MD;  Location: [facility];  Service: Open Heart Surgery;  Laterality: N/A;    Current Outpatient Medications  Medication Sig Dispense Refill  . ALPRAZolam (XANAX) 0.5 MG tablet Take 0.5 mg by mouth at bedtime as needed for anxiety.    . Ascorbic Acid (VITAMIN C PO) Take 1 tablet by mouth daily.    [name] atorvastatin (LIPITOR) 40 MG tablet Take 1 tablet (40 mg total) by mouth daily at 6 PM. 30 tablet 2  . cholecalciferol (VITAMIN D) 25 MCG (1000 UNIT) tablet Take 1,000 Units  by mouth daily.    . clopidogrel (PLAVIX) 75 MG tablet Take 1 tablet (75 mg total) by mouth daily with breakfast. 90 tablet 1  . glimepiride (AMARYL) 1 MG tablet Take 1 mg by mouth daily with breakfast.    . Glycerin-Hypromellose-PEG 400 (VISINE DRY EYE OP) Apply 1 drop to eye 3 (three) times daily as needed (for dry eye relief).     . LUTEIN PO Take 1 capsule by mouth daily.    . meclizine (ANTIVERT) 12.5 MG tablet Take 1 tablet (12.5 mg total) by mouth 3 (three) times daily as needed for dizziness. 20 tablet 0  . metoprolol tartrate (LOPRESSOR) 25 MG tablet Take 0.5 tablets (12.5 mg total) by mouth 2  (two) times daily. 30 tablet 0  . Multiple Vitamins-Minerals (PRESERVISION AREDS 2 PO) Take 1 tablet by mouth 2 (two) times daily. Takes 1 tablet in the morning and 1 tablet at night for eyes.    . pantoprazole (PROTONIX) 40 MG tablet Take 1 tablet (40 mg total) by mouth daily. 90 tablet 3   No current facility-administered medications for this visit.    Allergies as of 01/19/2021  . (No Known Allergies)    Family History  Problem Relation Age of Onset  . Cirrhosis Mother        no etoh use  . CAD Father   . Diabetes Mother        + brother  . Heart failure Father   . Hypertension Father        + brother  . Cancer Brother        lung, age 28  . Uterine cancer Sister   . Colon cancer Neg Hx     Social History   Socioeconomic History  . Marital status: Divorced    Spouse name: Not on file  . Number of children: 4  . Years of education: 6th grade  . Highest education level: Not on file  Occupational History  . Occupation: Retired    Comment: [employer]  Tobacco Use  . Smoking status: Never Smoker  . Smokeless tobacco: Never Used  . Tobacco comment: Quit x 50 years  Vaping Use  . Vaping Use: Never used  Substance and Sexual Activity  . Alcohol use: Never  . Drug use: Never  . Sexual activity: Not Currently  Other Topics Concern  . Not on file  Social History Narrative   Lives alone. Every other week she has family with her. Daughter and son take care of her during the days.    Right-handed.   No daily use of caffeine.             Social Determinants of Health   Financial Resource Strain: Not on file  Food Insecurity: Not on file  Transportation Needs: Not on file  Physical Activity: Not on file  Stress: Not on file  Social Connections: Not on file    Review of Systems: Gen: Denies fever, chills, cold or flulike symptoms, lightheadedness, dizziness, presyncope, syncope. CV: Denies chest pain or palpitations. Resp: Admits to shortness of breath with exertion.   No shortness of breath at rest.  Intermittent cough related to allergies. GI: See HPI Heme: See HPI  Physical Exam: BP 133/61   Pulse 84   Temp (!) 96.9 F (36.1 C) (Temporal)   Ht 5' 5"  (1.651 m)   Wt 167 lb (75.8 kg)   BMI 27.79 kg/m  General:   Alert and oriented. No distress noted. Pleasant and  cooperative.  Head:  Normocephalic and atraumatic. Eyes:  Conjuctiva clear without scleral icterus. Heart:  S1, S2 present without murmurs appreciated. Lungs:  Clear to auscultation bilaterally. No wheezes, rales, or rhonchi. No distress.  Abdomen:  +BS, soft, non-tender and non-distended. No rebound or guarding. No HSM or masses noted. Msk:  Symmetrical without gross deformities. Normal posture. Extremities:  Without edema. Neurologic:  Alert and  oriented x4 Psych:  Normal mood and affect.   Assessment: 87 year old female with history of diabetes, carotid artery stenosis, emphysema, GERD, CVA, hypertension, mild cognitive impairment, severe aortic stenosis status post TAVR, coronary artery disease status post stent placement in December 2021 on Plavix, priorhistory of gastrointestinal bleeding, small bowel lesion of unknown significance found in 2013,and hospitalized in March 2022 with symptomatic anemia and melena presenting today for hospital follow-up.   Hemoglobin 7.9 on admission.  Received 1 unit PRBCs with appropriate response.  Underwent EGD and colonoscopy during admission.  EGD with findings suggestive of short segment Barrett's, normal stomach, and small bowel.  Colonoscopy with diverticulosis, but no active bleeding.  Two 2-4 mm polyps were not removed as she was on Plavix.  Capsule study was completed with questionable nodule/small mass in the distal small bowel but without ulceration.  Evidence of hematochezia throughout the colon but no fresh blood.  Queried Dieulafoy lesion as culprit for obscure GI bleeding vs small bowel nodule. CTA completed without source for bleeding  but did note fluid collection in right groin overlying common iliac vessels that could reflect a hematoma, seroma, or pseudoaneurysm, which was reviewed by Dr. Carles Collet with Vascular and recommended non-operative management. Hemoglobin remained stable/imrpved to 9.3 by day of discharge. Repeat CBC with hemoglobin 9.8 on 3/24. She denies overt GI bleeding. No significant upper or lower GI symptoms. Maintains on Protonix daily for GERD which is well controlled.   Etiology of melena/anemia not clear. Likely occult GI bleed vs bleeding from small bowel nodule. Will plan to repeat CBC and check iron panel. If iron deficient, will supplement.  Discussed possibility of repeating a colonoscopy to remove colon polyps and discussing with Dr. Gala Romney whether the nodule in her small bowel needed further evaluation with repeat colonoscopy etc.  Patient and her granddaughter prefer to monitor for now rather than pursuing additional procedures.  I feel this is reasonable considering her age unless she were to have declining hemoglobin.   Plan:  1.  CBC and iron panel. 2.  Follow-up to be determined pending lab results.  3.  Monitor for bright red blood per rectum or black stools and let us know if this occurs.     Denise Altes, PA-C [facility] 01/19/2021

## 2021-01-19 ENCOUNTER — Other Ambulatory Visit: Payer: Self-pay

## 2021-01-19 ENCOUNTER — Encounter (HOSPITAL_COMMUNITY): Payer: Medicare Other

## 2021-01-19 ENCOUNTER — Encounter: Payer: Self-pay | Admitting: Gastroenterology

## 2021-01-19 ENCOUNTER — Ambulatory Visit (INDEPENDENT_AMBULATORY_CARE_PROVIDER_SITE_OTHER): Payer: Medicare Other | Admitting: Gastroenterology

## 2021-01-19 VITALS — BP 133/61 | HR 84 | Temp 96.9°F | Ht 65.0 in | Wt 167.0 lb

## 2021-01-19 DIAGNOSIS — D649 Anemia, unspecified: Secondary | ICD-10-CM | POA: Insufficient documentation

## 2021-01-19 DIAGNOSIS — Z9889 Other specified postprocedural states: Secondary | ICD-10-CM

## 2021-01-19 DIAGNOSIS — I739 Peripheral vascular disease, unspecified: Secondary | ICD-10-CM

## 2021-01-19 NOTE — Patient Instructions (Addendum)
Please have blood work completed at [facility].  We will call you with your results and further recommendations at that time.   Continue to monitor for bright red blood per rectum or black stools and let me know if this occurs.  It was great to see you today!  I am glad you are doing well!  Aliene Altes, PA-C [facility]

## 2021-01-20 ENCOUNTER — Other Ambulatory Visit: Payer: Self-pay | Admitting: Gastroenterology

## 2021-01-20 ENCOUNTER — Other Ambulatory Visit: Payer: Self-pay

## 2021-01-20 ENCOUNTER — Encounter: Payer: Self-pay | Admitting: Internal Medicine

## 2021-01-20 DIAGNOSIS — D509 Iron deficiency anemia, unspecified: Secondary | ICD-10-CM

## 2021-01-20 DIAGNOSIS — Z79899 Other long term (current) drug therapy: Secondary | ICD-10-CM

## 2021-01-20 DIAGNOSIS — E611 Iron deficiency: Secondary | ICD-10-CM

## 2021-01-20 LAB — CBC WITH DIFFERENTIAL/PLATELET
Absolute Monocytes: 717 cells/uL (ref 200–950)
Basophils Absolute: 20 cells/uL (ref 0–200)
Basophils Relative: 0.3 %
Eosinophils Absolute: 101 cells/uL (ref 15–500)
Eosinophils Relative: 1.5 %
HCT: 30.2 % — ABNORMAL LOW (ref 35.0–45.0)
Hemoglobin: 9.3 g/dL — ABNORMAL LOW (ref 11.7–15.5)
Lymphs Abs: 2003 cells/uL (ref 850–3900)
MCH: 26.1 pg — ABNORMAL LOW (ref 27.0–33.0)
MCHC: 30.8 g/dL — ABNORMAL LOW (ref 32.0–36.0)
MCV: 84.6 fL (ref 80.0–100.0)
MPV: 10.4 fL (ref 7.5–12.5)
Monocytes Relative: 10.7 %
Neutro Abs: 3859 cells/uL (ref 1500–7800)
Neutrophils Relative %: 57.6 %
Platelets: 255 10*3/uL (ref 140–400)
RBC: 3.57 10*6/uL — ABNORMAL LOW (ref 3.80–5.10)
RDW: 15.4 % — ABNORMAL HIGH (ref 11.0–15.0)
Total Lymphocyte: 29.9 %
WBC: 6.7 10*3/uL (ref 3.8–10.8)

## 2021-01-20 LAB — IRON,TIBC AND FERRITIN PANEL
%SAT: 9 % (calc) — ABNORMAL LOW (ref 16–45)
Ferritin: 38 ng/mL (ref 16–288)
Iron: 34 ug/dL — ABNORMAL LOW (ref 45–160)
TIBC: 380 mcg/dL (calc) (ref 250–450)

## 2021-01-20 MED ORDER — FERROUS SULFATE 325 (65 FE) MG PO TABS: 325.0000 mg | ORAL_TABLET | Freq: Every day | ORAL | 3 refills | Status: DC

## 2021-01-21 ENCOUNTER — Encounter (HOSPITAL_COMMUNITY): Payer: Medicare Other

## 2021-01-21 NOTE — Progress Notes (Signed)
Cc'ed to pcp °

## 2021-01-24 ENCOUNTER — Encounter (HOSPITAL_COMMUNITY): Payer: Medicare Other

## 2021-01-26 ENCOUNTER — Encounter (HOSPITAL_COMMUNITY): Payer: Medicare Other

## 2021-01-26 ENCOUNTER — Other Ambulatory Visit: Payer: Self-pay

## 2021-01-26 DIAGNOSIS — Z79899 Other long term (current) drug therapy: Secondary | ICD-10-CM

## 2021-01-26 DIAGNOSIS — E611 Iron deficiency: Secondary | ICD-10-CM

## 2021-01-28 ENCOUNTER — Encounter (HOSPITAL_COMMUNITY): Payer: Medicare Other

## 2021-01-31 ENCOUNTER — Encounter (HOSPITAL_COMMUNITY): Payer: Medicare Other

## 2021-02-02 ENCOUNTER — Encounter (HOSPITAL_COMMUNITY): Payer: Medicare Other

## 2021-02-04 ENCOUNTER — Encounter (HOSPITAL_COMMUNITY): Payer: Medicare Other

## 2021-02-12 LAB — CBC WITH DIFFERENTIAL/PLATELET
Absolute Monocytes: 505 cells/uL (ref 200–950)
Basophils Absolute: 20 cells/uL (ref 0–200)
Basophils Relative: 0.4 %
Eosinophils Absolute: 100 cells/uL (ref 15–500)
Eosinophils Relative: 2 %
HCT: 35.7 % (ref 35.0–45.0)
Hemoglobin: 10.8 g/dL — ABNORMAL LOW (ref 11.7–15.5)
Lymphs Abs: 1705 cells/uL (ref 850–3900)
MCH: 26.5 pg — ABNORMAL LOW (ref 27.0–33.0)
MCHC: 30.3 g/dL — ABNORMAL LOW (ref 32.0–36.0)
MCV: 87.7 fL (ref 80.0–100.0)
MPV: 10.1 fL (ref 7.5–12.5)
Monocytes Relative: 10.1 %
Neutro Abs: 2670 cells/uL (ref 1500–7800)
Neutrophils Relative %: 53.4 %
Platelets: 224 10*3/uL (ref 140–400)
RBC: 4.07 10*6/uL (ref 3.80–5.10)
RDW: 19 % — ABNORMAL HIGH (ref 11.0–15.0)
Total Lymphocyte: 34.1 %
WBC: 5 10*3/uL (ref 3.8–10.8)

## 2021-02-12 LAB — IRON,TIBC AND FERRITIN PANEL
%SAT: 30 % (calc) (ref 16–45)
Ferritin: 103 ng/mL (ref 16–288)
Iron: 104 ug/dL (ref 45–160)
TIBC: 346 mcg/dL (calc) (ref 250–450)

## 2021-02-24 ENCOUNTER — Other Ambulatory Visit: Payer: Self-pay | Admitting: *Deleted

## 2021-02-24 ENCOUNTER — Telehealth: Payer: Self-pay | Admitting: *Deleted

## 2021-02-24 NOTE — Telephone Encounter (Signed)
Lets have her continue iron for now and plan to recheck CBC and iron panel with ferritin 1 week prior to her appointment in September. Please arrange labs. Dx: IDA.

## 2021-02-24 NOTE — Telephone Encounter (Signed)
Spoke to daughter (listed on [place]).  Informed her that hemoglobin has improved to 10.8 from 9.3 on 5/4. Iron panel has also returned to normal.    Pt is still taking iron supplement but wants to know if she can stop or if she needs to stay on it.

## 2021-02-28 ENCOUNTER — Other Ambulatory Visit: Payer: Self-pay | Admitting: *Deleted

## 2021-02-28 DIAGNOSIS — D509 Iron deficiency anemia, unspecified: Secondary | ICD-10-CM

## 2021-02-28 NOTE — Telephone Encounter (Signed)
Spoke with daughter.  She was made aware that pt needs to continue iron for now.  She was made aware that we would like to check labs 1 week prior to her appt here.  She voiced understanding and requested to have drawn at [facility].  Entered into Epic.

## 2021-03-29 ENCOUNTER — Other Ambulatory Visit: Payer: Self-pay | Admitting: Family Medicine

## 2021-03-29 ENCOUNTER — Other Ambulatory Visit (HOSPITAL_COMMUNITY): Payer: Self-pay | Admitting: Family Medicine

## 2021-03-29 DIAGNOSIS — R070 Pain in throat: Secondary | ICD-10-CM

## 2021-04-12 ENCOUNTER — Other Ambulatory Visit: Payer: Self-pay

## 2021-04-12 ENCOUNTER — Ambulatory Visit (HOSPITAL_COMMUNITY)
Admission: RE | Admit: 2021-04-12 | Discharge: 2021-04-12 | Disposition: A | Discharge status: Home / Self Care | Payer: Medicare Other | Source: Ambulatory Visit | Attending: Family Medicine | Admitting: Family Medicine

## 2021-04-12 DIAGNOSIS — R070 Pain in throat: Secondary | ICD-10-CM | POA: Insufficient documentation

## 2021-04-20 ENCOUNTER — Other Ambulatory Visit (HOSPITAL_COMMUNITY): Payer: Self-pay | Admitting: Family Medicine

## 2021-04-20 ENCOUNTER — Other Ambulatory Visit: Payer: Self-pay

## 2021-04-20 ENCOUNTER — Ambulatory Visit (HOSPITAL_COMMUNITY)
Admission: RE | Admit: 2021-04-20 | Discharge: 2021-04-20 | Disposition: A | Discharge status: Home / Self Care | Payer: Medicare Other | Source: Ambulatory Visit | Attending: Family Medicine | Admitting: Family Medicine

## 2021-04-20 DIAGNOSIS — J984 Other disorders of lung: Secondary | ICD-10-CM

## 2021-04-26 ENCOUNTER — Other Ambulatory Visit: Payer: Self-pay

## 2021-04-26 DIAGNOSIS — D509 Iron deficiency anemia, unspecified: Secondary | ICD-10-CM

## 2021-04-28 ENCOUNTER — Encounter: Payer: Self-pay | Admitting: Physician Assistant

## 2021-04-28 ENCOUNTER — Other Ambulatory Visit: Payer: Self-pay

## 2021-04-28 ENCOUNTER — Ambulatory Visit (INDEPENDENT_AMBULATORY_CARE_PROVIDER_SITE_OTHER): Payer: Medicare Other | Admitting: Physician Assistant

## 2021-04-28 VITALS — BP 138/70 | HR 79 | Ht 65.0 in | Wt 166.8 lb

## 2021-04-28 DIAGNOSIS — Z8719 Personal history of other diseases of the digestive system: Secondary | ICD-10-CM | POA: Diagnosis not present

## 2021-04-28 DIAGNOSIS — I25810 Atherosclerosis of coronary artery bypass graft(s) without angina pectoris: Secondary | ICD-10-CM | POA: Diagnosis not present

## 2021-04-28 DIAGNOSIS — Z952 Presence of prosthetic heart valve: Secondary | ICD-10-CM

## 2021-04-28 NOTE — Progress Notes (Signed)
[facility]                                     Cardiology Office Note:    Date:  04/28/2021   ID:  Denise Parrish, DOB Sep 14, 1923, MRN [id number]  PCP:  Denise Andrea, MD  Denise Parrish Cardiologist:  Denise Rouge, MD / Dr. Burt Parrish & Dr. Cyndia Parrish (TAVR) Referring MD: Denise Andrea, MD   6 month follow up.   History of Present Illness:    Denise Parrish is a 87 y.o. female with a hx of mild dementia, DMT2, GERD, HLD, HTN, obesity, emphysema, hiatal hernia, history of TIA/CVA, carotid artery disease s/p right CEA 10/2019, complex hospitalization in 04/2020 with GI bleed requiring transfusion, demand ischemia, and mechanical fall in the hospital with a fracture of the left fibula and severe AS s/p TAVR (09/28/20) who presents to clinic for follow up.   She has a history of aortic stenosis followed by Dr. Johnsie Parrish. She had a difficult hospitalization in August 2021 when she was admitted with a GI bleed and demand ischemia and suffered a mechanical fall in the hospital with fracture of her left fibula. She then reported progressive exertional shortness of breath and fatigue and was diagnosed with severe AS. During her work-up for aortic stenosis and consideration of TAVR she underwent cardiac catheterization on 09/08/2020 which showed a 90% left circumflex stenosis and a large caliber vessel.  This was successfully treated with drug-eluting stent, which led to an improvement in symptoms. Catheterization also showed a 60 to 70% distal RCA stenosis and mild nonobstructive LAD disease.  The mean gradient across aortic valve at catheterization was 57 mmHg with a valve area of 0.78 cm2.  She was placed on plavix monotherapy given recent GI bleed.   She was evaluated by the multidisciplinary valve team and underwent successful TAVR with a 23 mm Medtronic Evolut Pro+ THV via the TF approach on 09/28/20. Post operative echo showed normally  functioning TAVR with a mean gradient of 11 mm hg and no PVL. Shortly after TAVR she developed acute right limb ischemia that required emergent exploration and primary repair right femoral artery. She was discharged on POD 2 on monotherapy with plavix. 1 month echo showed EF 70%, normally functioning TAVR with a mean gradient of 8.3 and no PVL.  She was admitted in 11/2020 for acute GI bleed requiring transfusion. She underwent extensive GI work up and plavix was held but ultimately restarted. Follow up Hg was 10.8 in May.  Today she presents to clinic for follow up. Here with her daughter and doing well. It took her a while to recover but finally feels better.  No CP or SOB. No LE edema, orthopnea or PND. No dizziness or syncope. No blood in stool or urine. No palpitations.    Past Medical History:  Diagnosis Date   Anxiety    Carotid stenosis, right 10/16/2019   75% on CTA neck   Cholelithiasis    on u/s 09/2011   Degenerative joint disease    Right THA in 1995; cervical discectomy and fusion-2001   Diabetes mellitus, type 2 (HCC)    + neuropathy   Emphysema    Gastroesophageal reflux disease    Hiatal hernia    History of CVA (cerebrovascular accident)    Hyperlipidemia    Hypertension    IDA (iron deficiency  anemia)    labs 09/2011   Mild cognitive impairment    Obesity    Osteoporosis    Pneumonia    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   s/p TAVR with a 80m Medtronic Evolut Pro+ via the TF approach    Severe aortic stenosis    Sigmoid diverticulosis    Small bowel lesion    On Given's capsule; Dr GKathleene Hazel11/2013, no further w/u needed    Past Surgical History:  Procedure Laterality Date   ANTERIOR CERVICAL DISCECTOMY  2001   C4-5, allograft, fixation   BREAST LUMPECTOMY     benign   CATARACT EXTRACTION W/ INTRAOCULAR LENS IMPLANT  2007   Left   COLONOSCOPY  2006   COLONOSCOPY  04/03/12   Dr. ROk Edwardsdiverticulosis, negative  microscopic  colitis    COLONOSCOPY WITH PROPOFOL N/A 11/27/2020   Surgeon: CHarvel Quale MD;  diverticulosis, no active bleeding.  Two 2-4 mm polyps not removed in the setting of Plavix.   CORONARY STENT INTERVENTION N/A 09/08/2020   Procedure: CORONARY STENT INTERVENTION;  Surgeon: CSherren Mocha MD;  [facility];  Service: Cardiovascular;  Laterality: N/A;   ENDARTERECTOMY Right 10/21/2019   Procedure: ENDARTERECTOMY CAROTID RIGHT;  Surgeon: CWaynetta Sandy MD;  [facility]  Service: Vascular;  Laterality: Right;   ESOPHAGOGASTRODUODENOSCOPY  04/03/12   Dr. RJennet Madurohernia, chronic gastritis on bx   ESOPHAGOGASTRODUODENOSCOPY (EGD) WITH PROPOFOL N/A 04/28/2020   Procedure: ESOPHAGOGASTRODUODENOSCOPY (EGD) WITH PROPOFOL;  Surgeon: BOtis Brace MD;  [facility]  Service: Gastroenterology;  Laterality: N/A;   ESOPHAGOGASTRODUODENOSCOPY (EGD) WITH PROPOFOL N/A 11/27/2020   Surgeon: CMontez Morita DQuillian Quince MD; findings suggestive of short segment Barrett's, normal stomach, and small bowel.   FEMORAL ARTERY EXPLORATION Right 09/28/2020   Procedure: RIGHT GROIN EXPLORATION WITH REPAIR RIGHT COMMON FEMORAL ARTERY;  Surgeon: ERosetta Posner MD;  [facility];  Service: Vascular;  Laterality: Right;   GIVENS CAPSULE STUDY  05/09/2012   RMR: an unclear raised area of small bowel was noted, with features almost  characteristic of very small polyp. This was without villous  blunting or any evidence of active bleeding; yet, the area of  concern appeared to be erythematous. However, this could simply  be a light reflection on a normal variation of the small bowel. REFERRED TO DR. GILLIAM, APPT FOR NOVEMBER 2013.    GIVENS CAPSULE STUDY N/A 04/30/2020   Procedure: GIVENS CAPSULE STUDY;  Surgeon: BOtis Brace MD;  [facility]  Service: Gastroenterology;  Laterality: N/A;   [place]  11/27/2020    Surgeon: CMontez Morita DQuillian Quince MD;  questionable nodule/small mass in the distal small bowel but without ulceration.  Evidence of hematochezia throughout the colon but no fresh blood.    HIP ARTHROPLASTY Right    PATCH ANGIOPLASTY Right 10/21/2019   Procedure: PATCH ANGIOPLASTY USING XRueben BashBIOLOGIC PATCH;  Surgeon: CWaynetta Sandy MD;  [facility]  Service: Vascular;  Laterality: Right;   POLYPECTOMY  04/28/2020   Procedure: POLYPECTOMY;  Surgeon: BOtis Brace MD;  [facility];  Service: Gastroenterology;;   RIGHT/LEFT HEART CATH AND CORONARY ANGIOGRAPHY N/A 09/08/2020   Procedure: RIGHT/LEFT HEART CATH AND CORONARY ANGIOGRAPHY;  Surgeon: CSherren Mocha MD;  [facility];  Service: Cardiovascular;  Laterality: N/A;   TEE WITHOUT CARDIOVERSION N/A 09/28/2020   Procedure: TRANSESOPHAGEAL ECHOCARDIOGRAM (TEE);  Surgeon: CSherren Mocha MD;  [facility]  Service: Open Heart  Surgery;  Laterality: N/A;   TONSILLECTOMY     TOTAL HIP ARTHROPLASTY  1995   Right   TRANSCATHETER AORTIC VALVE REPLACEMENT, TRANSFEMORAL N/A 09/28/2020   Procedure: TRANSCATHETER AORTIC VALVE REPLACEMENT, TRANSFEMORAL;  Surgeon: Sherren Mocha, MD;  Location: [facility];  Service: Open Heart Surgery;  Laterality: N/A;    Current Medications: Current Meds  Medication Sig   ALPRAZolam (XANAX) 0.5 MG tablet Take 0.5 mg by mouth at bedtime as needed for anxiety.   Ascorbic Acid (VITAMIN C PO) Take 1 tablet by mouth daily.   atorvastatin (LIPITOR) 40 MG tablet Take 1 tablet (40 mg total) by mouth daily at 6 PM.   cholecalciferol (VITAMIN D) 25 MCG (1000 UNIT) tablet Take 1,000 Units by mouth daily.   clopidogrel (PLAVIX) 75 MG tablet Take 1 tablet (75 mg total) by mouth daily with breakfast.   ferrous sulfate 325 (65 FE) MG tablet Take 1 tablet (325 mg total) by mouth daily.   glimepiride (AMARYL) 1 MG tablet Take 1 mg by mouth daily with breakfast.   Glycerin-Hypromellose-PEG 400 (VISINE  DRY EYE OP) Apply 1 drop to eye 3 (three) times daily as needed (for dry eye relief).    LUTEIN PO Take 1 capsule by mouth daily.   meclizine (ANTIVERT) 12.5 MG tablet Take 1 tablet (12.5 mg total) by mouth 3 (three) times daily as needed for dizziness.   metoprolol tartrate (LOPRESSOR) 25 MG tablet Take 0.5 tablets (12.5 mg total) by mouth 2 (two) times daily.   Multiple Vitamins-Minerals (PRESERVISION AREDS 2 PO) Take 1 tablet by mouth 2 (two) times daily. Takes 1 tablet in the morning and 1 tablet at night for eyes.   ondansetron (ZOFRAN) 4 MG tablet Take 4 mg by mouth every 6 (six) hours as needed.   pantoprazole (PROTONIX) 40 MG tablet Take 1 tablet (40 mg total) by mouth daily.     Allergies:   Patient has no known allergies.   Social History   Socioeconomic History   Marital status: Divorced    Spouse name: Not on file   Number of children: 4   Years of education: 6th grade   Highest education level: Not on file  Occupational History   Occupation: Retired    Comment: [employer]  Tobacco Use   Smoking status: Never   Smokeless tobacco: Never   Tobacco comments:    Quit x 50 years  Vaping Use   Vaping Use: Never used  Substance and Sexual Activity   Alcohol use: Never   Drug use: Never   Sexual activity: Not Currently  Other Topics Concern   Not on file  Social History Narrative   Lives alone. Every other week she has family with her. Daughter and son take care of her during the days.    Right-handed.   No daily use of caffeine.             Social Determinants of Health   Financial Resource Strain: Not on file  Food Insecurity: Not on file  Transportation Needs: Not on file  Physical Activity: Not on file  Stress: Not on file  Social Connections: Not on file     Family History: The patient's family history includes CAD in her father; Cancer in her brother; Cirrhosis in her mother; Diabetes in her mother; Heart failure in her father; Hypertension in her father;  Uterine cancer in her sister. There is no history of Colon cancer.  ROS:   Please see the history of present  illness.    All other systems reviewed and are negative.  EKGs/Labs/Other Studies Reviewed:    The following studies were reviewed today: Operateive NOTE     Date of Procedure:                 09/28/2020   Preoperative Diagnosis:      Severe Aortic Stenosis    Postoperative Diagnosis:    Same    Procedure:        Transcatheter Aortic Valve Replacement - Percutaneous Right Transfemoral Approach             Medtronic Evolut-PRO+  (size 23 mm, model # EVPROPLUS-23US, serial # [id number])              Co-Surgeons:            Gaye Pollack, MD and Sherren Mocha, MD     Anesthesiologist:                  Jenita Seashore, MD   Echocardiographer:              Liane Comber, MD   Pre-operative Echo Findings: Severe aortic stenosis   Normal left ventricular systolic function   Post-operative Echo Findings: No paravalvular leak Normal left ventricular systolic function Normal mean prosthetic transvalvular gradient of 16 mm Hg.   ________________________     Echo 09/29/20:  IMPRESSIONS   1. The mitral valve is degenerative. Mild mitral valve regurgitation.  Moderate mitral stenosis. Severe mitral annular calcification.   2. Post TAVR with 23 mm supra annular Medtronic Evolut valve No  significant PVL mean gradient 11 peak 19 mmHg lower than implant DVI 0.69  and AVA 2.1 cm2. The aortic valve has been repaired/replaced.   3. There is severely elevated pulmonary artery systolic pressure.   4. The inferior vena cava is dilated in size with >50% respiratory  variability, suggesting right atrial pressure of 8 mmHg.    ____________________  Echo 10/25/20 IMPRESSIONS   1. Left ventricular ejection fraction, by estimation, is 70 to 75%. The  left ventricle has hyperdynamic function. The left ventricle has no  regional wall motion abnormalities. There is mild left ventricular   hypertrophy. Left ventricular diastolic  parameters are consistent with Grade I diastolic dysfunction (impaired  relaxation). Elevated left atrial pressure.   2. Right ventricular systolic function is normal. The right ventricular  size is normal. There is mildly elevated pulmonary artery systolic  pressure.   3. Left atrial size was mildly dilated.   4. Right atrial size was mildly dilated.   5. The mitral valve is abnormal. No evidence of mitral valve  regurgitation. Mild mitral stenosis.   6. Medtronic Evolut-PRO+ size 23 mm valve is in the AV position. The  prosthetic valve has normal function. . The aortic valve has been  repaired/replaced. Aortic valve regurgitation is not visualized. No aortic  stenosis is present.   7. The inferior vena cava is normal in size with greater than 50%  respiratory variability, suggesting right atrial pressure of 3 mmHg.   EKG:  EKG is NOT ordered today.    Recent Labs: 09/29/2020: Magnesium 1.8 11/26/2020: ALT 28 11/29/2020: BUN 7; Creatinine, Ser 0.96; Potassium 4.0; Sodium 141 02/11/2021: Hemoglobin 10.8; Platelets 224  Recent Lipid Panel    Component Value Date/Time   CHOL 100 04/08/2020 0441   TRIG 83 04/08/2020 0441   HDL 35 (L) 04/08/2020 0441   CHOLHDL 2.9 04/08/2020 0441   VLDL 17 04/08/2020  0441   LDLCALC 48 04/08/2020 0441     Risk Assessment/Calculations:       Physical Exam:    VS:  BP 138/70   Pulse 79   Ht 5' 5"  (1.651 m)   Wt 166 lb 12.8 oz (75.7 kg)   SpO2 98%   BMI 27.76 kg/m     Wt Readings from Last 3 Encounters:  04/28/21 166 lb 12.8 oz (75.7 kg)  01/19/21 167 lb (75.8 kg)  01/14/21 167 lb 3.2 oz (75.8 kg)     GEN: Well nourished, well developed in no acute distress, obese HEENT: Normal NECK: No JVD LYMPHATICS: No lymphadenopathy CARDIAC: RRR, soft flow murmur @ RUSB. No rubs, gallops RESPIRATORY:  Clear to auscultation without rales, wheezing or rhonchi  ABDOMEN: Soft, non-tender,  non-distended MUSCULOSKELETAL:  No edema; No deformity  SKIN: Warm and dry.  NEUROLOGIC:  Alert and oriented x 3 PSYCHIATRIC:  Normal affect   ASSESSMENT:    1. S/P TAVR (transcatheter aortic valve replacement)   2. Coronary artery disease involving nonautologous biological coronary bypass graft without angina pectoris   3. History of GI bleed     PLAN:    In order of problems listed above:  Severe AS s/p TAVR: doing great ~ 7 months out from TAVR. Groin site totally healed and cleared by vascular. Back at [employer]. Continue plavix. I will see her back in January for 1 year follow up.    CAD: pre TAVR cath showed severe 90% left circumflex stenosis (large caliber vessel) with successful PCI using a 3.5 x 12 mm resolute Onyx DES. There was moderate to severe distal RCA stenosis of 60 to 70% and mild nonobstructive LAD stenosis. She has completed 6 months of plavix. I offered to switch this to baby aspirin now, but she is hesitant and would like to keep everything as is since she is doing so well. She will continue on monotherapy with plavix.     Hx of GI bleeding with anemia: recurrent GI bleed in March of this year but Hg stable ~ 10.8. Followed closely by PCP and GI.    Medication Adjustments/Labs and Tests Ordered: Current medicines are reviewed at length with the patient today.  Concerns regarding medicines are outlined above.  Orders Placed This Encounter  Procedures   ECHOCARDIOGRAM COMPLETE    No orders of the defined types were placed in this encounter.   Patient Instructions  Medication Instructions:  Continue on your current medications.  *If you need a refill on your cardiac medications before your next appointment, please call your pharmacy*   Lab Work: None   Testing/Procedures: Echo prior to next visit in 6 months    Follow-Up: As scheduled in 6 months with echo prior  Other Instructions     Signed, Angelena Form, PA-C  04/28/2021 3:59  PM    [place]

## 2021-04-28 NOTE — Patient Instructions (Signed)
Medication Instructions:  Continue on your current medications.  *If you need a refill on your cardiac medications before your next appointment, please call your pharmacy*   Lab Work: None   Testing/Procedures: Echo prior to next visit in 6 months    Follow-Up: As scheduled in 6 months with echo prior  Other Instructions

## 2021-05-22 ENCOUNTER — Emergency Department (HOSPITAL_COMMUNITY)
Admission: EM | Admit: 2021-05-22 | Discharge: 2021-05-22 | Disposition: A | Discharge status: Home / Self Care | Payer: Medicare Other | Attending: Emergency Medicine | Admitting: Emergency Medicine

## 2021-05-22 ENCOUNTER — Other Ambulatory Visit: Payer: Self-pay

## 2021-05-22 ENCOUNTER — Emergency Department (HOSPITAL_COMMUNITY): Payer: Medicare Other

## 2021-05-22 DIAGNOSIS — E1140 Type 2 diabetes mellitus with diabetic neuropathy, unspecified: Secondary | ICD-10-CM | POA: Diagnosis not present

## 2021-05-22 DIAGNOSIS — Z7984 Long term (current) use of oral hypoglycemic drugs: Secondary | ICD-10-CM | POA: Insufficient documentation

## 2021-05-22 DIAGNOSIS — Z79899 Other long term (current) drug therapy: Secondary | ICD-10-CM | POA: Insufficient documentation

## 2021-05-22 DIAGNOSIS — I10 Essential (primary) hypertension: Secondary | ICD-10-CM | POA: Insufficient documentation

## 2021-05-22 DIAGNOSIS — Z96641 Presence of right artificial hip joint: Secondary | ICD-10-CM | POA: Insufficient documentation

## 2021-05-22 DIAGNOSIS — Z7902 Long term (current) use of antithrombotics/antiplatelets: Secondary | ICD-10-CM | POA: Diagnosis not present

## 2021-05-22 DIAGNOSIS — R531 Weakness: Secondary | ICD-10-CM | POA: Diagnosis present

## 2021-05-22 DIAGNOSIS — U071 COVID-19: Secondary | ICD-10-CM | POA: Insufficient documentation

## 2021-05-22 LAB — COMPREHENSIVE METABOLIC PANEL
ALT: 46 U/L — ABNORMAL HIGH (ref 0–44)
AST: 59 U/L — ABNORMAL HIGH (ref 15–41)
Albumin: 3.7 g/dL (ref 3.5–5.0)
Alkaline Phosphatase: 72 U/L (ref 38–126)
Anion gap: 9 (ref 5–15)
BUN: 14 mg/dL (ref 8–23)
CO2: 25 mmol/L (ref 22–32)
Calcium: 8.7 mg/dL — ABNORMAL LOW (ref 8.9–10.3)
Chloride: 103 mmol/L (ref 98–111)
Creatinine, Ser: 1.07 mg/dL — ABNORMAL HIGH (ref 0.44–1.00)
GFR, Estimated: 48 mL/min — ABNORMAL LOW (ref >60)
Glucose, Bld: 110 mg/dL — ABNORMAL HIGH (ref 70–99)
Potassium: 3.8 mmol/L (ref 3.5–5.1)
Sodium: 137 mmol/L (ref 135–145)
Total Bilirubin: 0.8 mg/dL (ref 0.3–1.2)
Total Protein: 6.7 g/dL (ref 6.5–8.1)

## 2021-05-22 LAB — CBC WITH DIFFERENTIAL/PLATELET
Abs Immature Granulocytes: 0.01 10*3/uL (ref 0.00–0.07)
Basophils Absolute: 0 10*3/uL (ref 0.0–0.1)
Basophils Relative: 0 %
Eosinophils Absolute: 0 10*3/uL (ref 0.0–0.5)
Eosinophils Relative: 0 %
HCT: 34.3 % — ABNORMAL LOW (ref 36.0–46.0)
Hemoglobin: 11.1 g/dL — ABNORMAL LOW (ref 12.0–15.0)
Immature Granulocytes: 0 %
Lymphocytes Relative: 15 %
Lymphs Abs: 0.6 10*3/uL — ABNORMAL LOW (ref 0.7–4.0)
MCH: 29.4 pg (ref 26.0–34.0)
MCHC: 32.4 g/dL (ref 30.0–36.0)
MCV: 91 fL (ref 80.0–100.0)
Monocytes Absolute: 0.6 10*3/uL (ref 0.1–1.0)
Monocytes Relative: 13 %
Neutro Abs: 3.1 10*3/uL (ref 1.7–7.7)
Neutrophils Relative %: 72 %
Platelets: 157 10*3/uL (ref 150–400)
RBC: 3.77 MIL/uL — ABNORMAL LOW (ref 3.87–5.11)
RDW: 14.2 % (ref 11.5–15.5)
WBC: 4.4 10*3/uL (ref 4.0–10.5)
nRBC: 0 % (ref 0.0–0.2)

## 2021-05-22 LAB — URINALYSIS, ROUTINE W REFLEX MICROSCOPIC
Bilirubin Urine: NEGATIVE
Glucose, UA: NEGATIVE mg/dL
Hgb urine dipstick: NEGATIVE
Ketones, ur: 15 mg/dL — AB
Leukocytes,Ua: NEGATIVE
Nitrite: NEGATIVE
Protein, ur: NEGATIVE mg/dL
Specific Gravity, Urine: 1.01 (ref 1.005–1.030)
pH: 7 (ref 5.0–8.0)

## 2021-05-22 LAB — LACTIC ACID, PLASMA
Lactic Acid, Venous: 1.2 mmol/L (ref 0.5–1.9)
Lactic Acid, Venous: 1.4 mmol/L (ref 0.5–1.9)

## 2021-05-22 LAB — RESP PANEL BY RT-PCR (FLU A&B, COVID) ARPGX2
Influenza A by PCR: NEGATIVE
Influenza B by PCR: NEGATIVE
SARS Coronavirus 2 by RT PCR: POSITIVE — AB

## 2021-05-22 LAB — LIPASE, BLOOD: Lipase: 34 U/L (ref 11–51)

## 2021-05-22 LAB — TROPONIN I (HIGH SENSITIVITY)
Troponin I (High Sensitivity): 13 ng/L (ref <18)
Troponin I (High Sensitivity): 14 ng/L (ref <18)

## 2021-05-22 MED ORDER — SODIUM CHLORIDE 0.9 % IV BOLUS
500.0000 mL | Freq: Once | INTRAVENOUS | Status: AC
  Administered 2021-05-22: 500 mL via INTRAVENOUS

## 2021-05-22 MED ORDER — MOLNUPIRAVIR EUA 200MG CAPSULE: 4.0000 | ORAL_CAPSULE | Freq: Two times a day (BID) | ORAL | 0 refills | Status: AC

## 2021-05-22 MED ORDER — ACETAMINOPHEN 325 MG PO TABS
650.0000 mg | ORAL_TABLET | Freq: Once | ORAL | Status: AC
  Administered 2021-05-22: 650 mg via ORAL
  Filled 2021-05-22: qty 2

## 2021-05-22 MED ORDER — ONDANSETRON HCL 4 MG/2ML IJ SOLN
4.0000 mg | Freq: Once | INTRAMUSCULAR | Status: AC
  Administered 2021-05-22: 4 mg via INTRAVENOUS
  Filled 2021-05-22: qty 2

## 2021-05-22 NOTE — Discharge Instructions (Addendum)
You were given a medication called molnupirovir to help treat your COVID symptoms.  This medication can help shorten the COVID symptoms and decrease the severity of symptoms preventing you from being admitted to the hospital and potentially preventing death.  Please take as directed.  Please follow-up closely with your primary care provider in the next 3 to 5 days for reassessment and return to the emergency department for any new or worsening symptoms in the meantime.

## 2021-05-22 NOTE — ED Notes (Signed)
Patient able to independently ambulate about in room without difficulty. Eating crackers and drinking fluids at this time.

## 2021-05-22 NOTE — ED Provider Notes (Signed)
[facility] EMERGENCY DEPARTMENT Provider Note   CSN: [id number] Arrival date & time: 05/22/21  0910     History Chief Complaint  Patient presents with   Weakness    Denise Parrish is a 87 y.o. female.  HPI  87 year old female with a history of anxiety, carotid stenosis, cholelithiasis, diabetes, emphysema, GERD, hiatal hernia, CVA, hyperlipidemia, hypertension, mild cognitive impairment, obesity, osteoporosis, aortic stenosis s/p TAVR, diverticulosis, who presents to the emergency department today for evaluation of generalized weakness.  States that for the last 1 to 2 days she is had chills.  She also reports that she has had a bit of a cough, rhinorrhea, nausea and shortness of breath.  She denies any chest pain, diarrhea, urinary symptoms or vomiting.  She has had decreased p.o. intake for the last 2 days due to her symptoms.  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   s/p TAVR with a 27m Medtronic Evolut Pro+ via the TF approach    Severe aortic stenosis    Sigmoid diverticulosis    Small bowel lesion    On Given's capsule; Dr GKathleene Hazel11/2013, no further w/u needed    Patient Active Problem List   Diagnosis Date Noted   Anemia 01/19/2021   Acute blood loss anemia 11/26/2020   Melena    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   History of CVA (cerebrovascular accident)     Severe aortic stenosis 09/08/2020   Closed left ankle fracture, initial encounter 05/04/2020   Essential hypertension 10/16/2019   Anxiety 12/08/2017   GERD (gastroesophageal reflux disease) 03/14/2012   Anemia, iron deficiency 10/28/2011   Di[place] 2 [place]    Hypertension    Hyperlipidemia    Obesity     Past Surgical History:  Procedure Laterality Date   ANTERIOR CERVICAL DISCECTOMY  2001   C4-5, allograft, fixation   BREAST LUMPECTOMY     benign   CATARACT EXTRACTION W/ INTRAOCULAR LENS IMPLANT  2007   Left   COLONOSCOPY  2006   COLONOSCOPY  04/03/12   Dr. ROk Edwardsdiverticulosis, negative microscopic  colitis    COLONOSCOPY WITH PROPOFOL N/A 11/27/2020   Surgeon: CHarvel Quale MD;  diverticulosis, no active bleeding.  Two 2-4 mm polyps not removed in the setting of Plavix.   CORONARY STENT INTERVENTION N/A 09/08/2020   Procedure: CORONARY STENT INTERVENTION;  Surgeon: CSherren Mocha MD;  [facility] [place] [place];  Service: Cardiovascular;  Laterality: N/A;   ENDARTERECTOMY Right 10/21/2019   Procedure: ENDARTERECTOMY CAROTID RIGHT;  Surgeon: CWaynetta Sandy MD;  [place]  Service: Vascular;  Laterality: Right;   ESOPHAGOGASTRODUODENOSCOPY  04/03/12   Dr. RJennet Madurohernia, chronic gastritis on bx   ESOPHAGOGASTRODUODENOSCOPY (EGD) WITH PROPOFOL N/A 04/28/2020   Procedure: ESOPHAGOGASTRODUODENOSCOPY (EGD)  WITH PROPOFOL;  Surgeon: Otis Brace, MD;  Location: [facility];  Service: Gastroenterology;  Laterality: N/A;   ESOPHAGOGASTRODUODENOSCOPY (EGD) WITH PROPOFOL N/A 11/27/2020   Surgeon: Montez Morita, Quillian Quince, MD; findings suggestive of short segment Barrett's, normal stomach, and small bowel.   FEMORAL ARTERY EXPLORATION Right 09/28/2020   Procedure: RIGHT GROIN EXPLORATION WITH REPAIR RIGHT COMMON FEMORAL ARTERY;  Surgeon: [name], MD;  Location: [facility] OR;  Service: Vascular;  Laterality: Right;   GIVENS  CAPSULE STUDY  05/09/2012   RMR: an unclear raised area of small bowel was noted, with features almost  characteristic of very small polyp. This was without villous  blunting or any evidence of active bleeding; yet, the area of  concern appeared to be erythematous. However, this could simply  be a light reflection on a normal variation of the small bowel. REFERRED TO DR. GILLIAM, APPT FOR NOVEMBER 2013.    GIVENS CAPSULE STUDY N/A 04/30/2020   Procedure: GIVENS CAPSULE STUDY;  Surgeon: Otis Brace, MD;  Location: [place];  Service: Gastroenterology;  Laterality: N/A;   [place] STUDY  11/27/2020    Surgeon: Montez Morita, Quillian Quince, MD;  questionable nodule/small mass in the distal small bowel but without ulceration.  Evidence of hematochezia throughout the colon but no fresh blood.    HIP ARTHROPLASTY Right    PATCH ANGIOPLASTY Right 10/21/2019   Procedure: PATCH ANGIOPLASTY USING Rueben Bash BIOLOGIC PATCH;  Surgeon: Waynetta Sandy, MD;  Location: [place];  Service: Vascular;  Laterality: Right;   POLYPECTOMY  04/28/2020   Procedure: POLYPECTOMY;  Surgeon: Otis Brace, MD;  Location: [place] [facility];  Service: Gastroenterology;;   RIGHT/LEFT HEART CATH AND CORONARY ANGIOGRAPHY N/A 09/08/2020   Procedure: RIGHT/LEFT HEART CATH AND CORONARY ANGIOGRAPHY;  Surgeon: Sherren Mocha, MD;  Location: [place] [facility];  Service: Cardiovascular;  Laterality: N/A;   TEE WITHOUT CARDIOVERSION N/A 09/28/2020   Procedure: TRANSESOPHAGEAL ECHOCARDIOGRAM (TEE);  Surgeon: Sherren Mocha, MD;  Location: [place];  Service: Open Heart Surgery;  Laterality: N/A;   TONSILLECTOMY     TOTAL HIP ARTHROPLASTY  1995   Right   TRANSCATHETER AORTIC VALVE REPLACEMENT, TRANSFEMORAL N/A 09/28/2020   Procedure: TRANSCATHETER AORTIC VALVE REPLACEMENT, TRANSFEMORAL;  Surgeon: Sherren Mocha, MD;  Location: [place];  Service: Open Heart Surgery;  Laterality: N/A;     OB History   No obstetric history on  file.     Family History  Problem Relation Age of Onset   Cirrhosis Mother        no etoh use   CAD Father    Diabetes Mother        + brother   Heart failure Father    Hypertension Father        + brother   Cancer Brother        lung, age 31   Uterine cancer Sister    Colon cancer Neg Hx     Social History   Tobacco Use   Smoking status: Never   Smokeless tobacco: Never   Tobacco comments:    Quit x 50 years  Vaping Use   Vaping Use: Never used  Substance Use Topics   Alcohol use: Never   Drug use: Never    Home Medications Prior to Admission medications   Medication Sig Start Date End Date Taking? Authorizing Provider  molnupiravir EUA 200 mg CAPS Take 4 capsules (800 mg total) by mouth 2 (two) times daily for 5 days. 05/22/21 05/27/21 Yes Tomisha Reppucci S, PA-C  ALPRAZolam (XANAX) 0.5 MG tablet Take 0.5 mg by mouth at bedtime as needed for anxiety.    [provider]  Ascorbic Acid (VITAMIN C PO) Take 1 tablet by mouth daily.    [provider]  atorvastatin (LIPITOR) 40 MG tablet Take 1 tablet (40 mg total) by mouth daily at 6 PM. 10/22/19   Georgette Shell, MD  cholecalciferol (VITAMIN D) 25 MCG (1000 UNIT) tablet Take 1,000 Units by mouth daily.    [provider]  clopidogrel (PLAVIX) 75 MG tablet Take 1 tablet (75 mg total) by mouth daily with breakfast. 10/06/20   Eileen Stanford, PA-C  ferrous sulfate 325 (65 FE) MG tablet Take 1 tablet (325 mg total) by mouth daily. 01/20/21 01/20/22  Erenest Rasher, PA-C  glimepiride (AMARYL) 1 MG tablet Take 1 mg by mouth daily with breakfast.    [provider]  Glycerin-Hypromellose-PEG 400 (VISINE DRY EYE OP) Apply 1 drop to eye 3 (three) times daily as needed (for dry eye relief).     [provider]  LUTEIN PO Take 1 capsule by mouth daily.    [provider]  meclizine (ANTIVERT) 12.5 MG tablet Take 1 tablet (12.5 mg total) by mouth 3 (three) times daily as  needed for dizziness. 12/14/19   Virgel Manifold, MD  metoprolol tartrate (LOPRESSOR) 25 MG tablet Take 0.5 tablets (12.5 mg total) by mouth 2 (two) times daily. 05/07/20   Mikhail, Velta Addison, DO  Multiple Vitamins-Minerals (PRESERVISION AREDS 2 PO) Take 1 tablet by mouth 2 (two) times daily. Takes 1 tablet in the morning and 1 tablet at night for eyes.    [provider]  ondansetron (ZOFRAN) 4 MG tablet Take 4 mg by mouth every 6 (six) hours as needed. 04/18/21   [provider]  pantoprazole (PROTONIX) 40 MG tablet Take 1 tablet (40 mg total) by mouth daily. 09/09/20   Sherren Mocha, MD    Allergies    Patient has no known allergies.  Review of Systems   Review of Systems  Constitutional:  Positive for chills and fatigue.  HENT:  Positive for rhinorrhea. Negative for ear pain and sore throat.   Eyes:  Negative for pain and visual disturbance.  Respiratory:  Positive for cough and shortness of breath.   Cardiovascular:  Negative for chest pain and leg swelling.  Gastrointestinal:  Positive for abdominal pain (mild intermittent) and nausea. Negative for constipation, diarrhea and vomiting.  Genitourinary:  Negative for dysuria and hematuria.  Musculoskeletal:  Negative for back pain.  Skin:  Negative for rash.  Neurological:  Positive for weakness. Negative for seizures.  All other systems reviewed and are negative.  Physical Exam Updated Vital Signs BP (!) 122/46   Pulse 65   Temp 98.1 F (36.7 C) (Oral)   Resp 19   Ht 5' 5"  (1.651 m)   Wt 75.1 kg   SpO2 97%   BMI 27.54 kg/m   Physical Exam Vitals and nursing note reviewed.  Constitutional:      General: She is not in acute distress.    Appearance: She is well-developed.  HENT:     Head: Normocephalic and atraumatic.     Mouth/Throat:     Mouth: Mucous membranes are dry.  Eyes:     Conjunctiva/sclera: Conjunctivae normal.  Cardiovascular:     Rate and Rhythm: Normal rate and regular rhythm.     Pulses:           Dorsalis pedis pulses  are 2+ on the right side and 2+ on the left side.     Heart sounds: Normal heart sounds. No murmur heard. Pulmonary:     Effort: Pulmonary effort is normal. No respiratory distress.     Breath sounds: Normal breath sounds. No wheezing, rhonchi or rales.     Comments: Dry cough Abdominal:     General: Bowel sounds are normal.     Palpations: Abdomen is soft.     Tenderness: There is no abdominal tenderness. There is no guarding or rebound.  Musculoskeletal:     Cervical back: Neck supple.     Right lower leg: No edema.     Left lower leg: No edema.  Skin:    General: Skin is warm and dry.  Neurological:     Mental Status: She is alert.    ED Results / Procedures / Treatments   Labs (all labs ordered are listed, but only abnormal results are displayed) Labs Reviewed  RESP PANEL BY RT-PCR (FLU A&B, COVID) ARPGX2 - Abnormal; Notable for the following components:      Result Value   SARS Coronavirus 2 by RT PCR POSITIVE (*)    All other components within normal limits  CBC WITH DIFFERENTIAL/PLATELET - Abnormal; Notable for the following components:   RBC 3.77 (*)    Hemoglobin 11.1 (*)    HCT 34.3 (*)    Lymphs Abs 0.6 (*)    All other components within normal limits  COMPREHENSIVE METABOLIC PANEL - Abnormal; Notable for the following components:   Glucose, Bld 110 (*)    Creatinine, Ser 1.07 (*)    Calcium 8.7 (*)    AST 59 (*)    ALT 46 (*)    GFR, Estimated 48 (*)    All other components within normal limits  URINALYSIS, ROUTINE W REFLEX MICROSCOPIC - Abnormal; Notable for the following components:   APPearance HAZY (*)    Ketones, ur 15 (*)    All other components within normal limits  CULTURE, BLOOD (SINGLE)  LIPASE, BLOOD  LACTIC ACID, PLASMA  LACTIC ACID, PLASMA  TROPONIN I (HIGH SENSITIVITY)  TROPONIN I (HIGH SENSITIVITY)    EKG None  Radiology DG Chest Portable 1 View  Result Date: 05/22/2021 CLINICAL DATA:  Malaise,  weakness and fatigue. EXAM: PORTABLE CHEST 1 VIEW COMPARISON:  04/20/2021 FINDINGS: Stable heart size and appearance of transcatheter aortic valve. Stable chronic lung disease with scattered areas of parenchymal scarring, biapical scarring/pleural thickening and interstitial prominence. There is no evidence of pulmonary edema, consolidation, pneumothorax or pleural fluid. Visualized bony structures are unremarkable. IMPRESSION: Stable chronic lung disease and appearance of transcatheter aortic valve. No acute findings. Electronically Signed   By: Aletta Edouard M.D.   On: 05/22/2021 10:27    Procedures Procedures   1:00 PM Cardiac monitoring reveals NSR, HR 66 (Rate & rhythm), as reviewed and interpreted by me. Cardiac monitoring was ordered due to sob, weakness, and to monitor patient for dysrhythmia.   Medications Ordered in ED Medications  acetaminophen (TYLENOL) tablet 650 mg (650 mg Oral Given 05/22/21 1042)  sodium chloride 0.9 % bolus 500 mL (0 mLs Intravenous Stopped 05/22/21 1328)  ondansetron (ZOFRAN) injection 4 mg (4 mg Intravenous Given 05/22/21 1042)    ED Course  I have reviewed the triage vital signs and the nursing notes.  Pertinent labs & imaging results that were available during my care of the patient were reviewed by me and considered in my medical decision making (see  chart for details).    MDM Rules/Calculators/A&P                          87 y/o F presents for generalized weakness, cough, nausea, sob x2 days  Reviewed/interpreted labs CBC Without leukocytosis, mild anemia present which appears chronic and actually improved from prior CMP with normal electrolytes, kidney function is marginally elevated at 1.07 but this does not appear much off her baseline.  Her LFTs are slightly elevated which is consistent with current COVID infection.  Doubt biliary pathology and do not feel imaging indicated at this time. Lipase negative Trop negative, doubt ACS COVID positive UA  negative for UTI  EKG with NSR, minimal ST depression laterally  Reviewed/interpreted imaging CXR - Stable chronic lung disease and appearance of transcatheter aortic valve. No acute findings  Patient's work-up here is very reassuring other than she is tested positive for COVID.  I think that this explains her symptoms of weakness nausea and cough.  She otherwise looks very well.  She was given a small fluid bolus and she has been tolerating fluids and crackers while in the department.  She was able to ambulate in the department without any difficulty.  She was offered admission due to her reports of weakness but she states that she feels comfortable with being discharged as she has family that assist her and she think she would do better at home.  She understands that if she gets worse she can return to the emergency department at any time.  She will be given Rx for molnupirovir for Her for her symptoms.  Not a candidate for Paxil COVID given her need to stay on Plavix.  Have advised PCP follow-up and strict return precautions and she voices understanding.  All questions answered.  Patient stable for discharge.  Pt was seen in conjunction with Dr. Alvino Chapel who personally evaluated the patient and is in agreement with the plan.   Final Clinical Impression(s) / ED Diagnoses Final diagnoses:  COVID    Rx / DC Orders ED Discharge Orders          Ordered    molnupiravir EUA 200 mg CAPS  2 times daily        05/22/21 [address] Aleigha Gilani S, PA-C 05/22/21 1356    Davonna Belling, MD 05/22/21 1526

## 2021-05-22 NOTE — ED Triage Notes (Signed)
Pt states general malaise beginning Friday, no specific complaint, states "just feel bad"

## 2021-05-27 LAB — CULTURE, BLOOD (SINGLE): Culture: NO GROWTH

## 2021-06-06 NOTE — Progress Notes (Signed)
Referring Provider: [name], MD Primary Care Physician:  [name], MD Primary GI Physician: Dr. Gala Romney  Chief Complaint  Patient presents with   Follow-up    Did not have blood work due to having Covid 3 weeks ago. Still weak     HPI:   Denise Parrish is a 87 y.o. female with medical history of diabetes, carotid artery stenosis, emphysema, GERD, CVA, hypertension, mild cognitive impairment, severe aortic stenosis status post TAVR, coronary artery disease status post stent placement in December 2021 on Plavix, prior history of gastrointestinal bleeding, small bowel lesion of unknown significance found in 2013, and hospitalized in March 2022 with symptomatic anemia and melena s/p  EGD, colonoscopy, and capsule study.  EGD with findings suggestive of short segment Barrett's, normal stomach, and small bowel.  Colonoscopy with diverticulosis, but no active bleeding.  Two 2-4 mm polyps were not removed as she was on Plavix.  Capsule study was completed with questionable nodule/small mass in the distal small bowel but without ulceration.  Evidence of hematochezia throughout the colon but no fresh blood.  Queried Dieulafoy lesion as culprit for obscure GI bleeding vs small bowel nodule. CTA completed without source for bleeding. There was consideration of repeat colonoscopy to determine if small bowel nodule could be reached. Additionally, it persistent clinical bleeding, would need to consider retrograde double-balloon enteroscopy.  However, as hemoglobin remained stable and was improving, no additional procedure were pursued.  She is presenting today for follow-up.   Last seen in our office on 01/19/21 for hospital follow-up.  She reported she was doing well with BRBPR or melena.  Maintained on Protonix 40 mg daily which kept GERD well controlled.  No other significant GI symptoms.  Discussed possibility of repeating a colonoscopy for polypectomy and possibly discussing with Dr.  Gala Romney whether the nodule in her distal small bowel needed further evaluation with repeat colonoscopy, etc.  However, patient and her granddaughter prefer to monitor for now rather than pursuing additional procedures.  Plan to update CBC and iron panel.  Labs completed 02/11/2021: Hemoglobin 10.8, improved from 9.3.  MCH and MCHC low.  Iron panel have returned to normal.  Recommended continuing iron for now with repeat blood work 1 week prior to next office visit.  Recent labs on file 05/22/2021 with hemoglobin stable/slightly improved to 11.1 with normocytic indices.  Patient also tested positive for COVID-19 on 9/4 and LFTs were mildly elevated with AST 59, ALT 46, alk phos and total bilirubin within normal limits.  Today:  Had COVID couple weeks ago. Still weak, but improving. Plans to have blood work completed today that we had previously ordered. Request results are called to her daughter.   Denies brbpr or melena. Stools were dark when taking a medication for COVID (Molnupiravir). Denies hematuria or vaginal bleeding.  Denies nausea, vomiting, abdominal pain. GERD well controlled on Protonix 40 mg daily. Denies dysphagia. Bowels moving well.   Tylenol 500 mg 3-4 times a week for arthritis.  No NSAIDs.  Stopped iron in May.    Past Medical History:  Diagnosis Date   Anxiety    Carotid stenosis, right 10/16/2019   75% on CTA neck   Cholelithiasis    on u/s 09/2011   Degenerative joint disease    Right THA in 1995; cervical discectomy and fusion-2001   Diabetes mellitus, type 2 (HCC)    + neuropathy   Emphysema    Gastroesophageal reflux disease    Hiatal hernia  History of CVA (cerebrovascular accident)    Hyperlipidemia    Hypertension    IDA (iron deficiency anemia)    labs 09/2011   Mild cognitive impairment    Obesity    Osteoporosis    Pneumonia    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   s/p TAVR with a 54m Medtronic Evolut Pro+ via the TF approach     Severe aortic stenosis    Sigmoid diverticulosis    Small bowel lesion    On Given's capsule; Dr GKathleene Hazel11/2013, no further w/u needed    Past Surgical History:  Procedure Laterality Date   ANTERIOR CERVICAL DISCECTOMY  2001   C4-5, allograft, fixation   BREAST LUMPECTOMY     benign   CATARACT EXTRACTION W/ INTRAOCULAR LENS IMPLANT  2007   Left   COLONOSCOPY  2006   COLONOSCOPY  04/03/12   Dr. ROk Edwardsdiverticulosis, negative microscopic  colitis    COLONOSCOPY WITH PROPOFOL N/A 11/27/2020   Surgeon: CHarvel Quale MD;  diverticulosis, no active bleeding.  Two 2-4 mm polyps not removed in the setting of Plavix.   CORONARY STENT INTERVENTION N/A 09/08/2020   Procedure: CORONARY STENT INTERVENTION;  Surgeon: CSherren Mocha MD;  [facility];  Service: Cardiovascular;  Laterality: N/A;   ENDARTERECTOMY Right 10/21/2019   Procedure: ENDARTERECTOMY CAROTID RIGHT;  Surgeon: CWaynetta Sandy MD;  [place] [facility]  Service: Vascular;  Laterality: Right;   ESOPHAGOGASTRODUODENOSCOPY  04/03/12   Dr. RJennet Madurohernia, chronic gastritis on bx   ESOPHAGOGASTRODUODENOSCOPY (EGD) WITH PROPOFOL N/A 04/28/2020   Procedure: ESOPHAGOGASTRODUODENOSCOPY (EGD) WITH PROPOFOL;  Surgeon: BOtis Brace MD;  [facility]  Service: Gastroenterology;  Laterality: N/A;   ESOPHAGOGASTRODUODENOSCOPY (EGD) WITH PROPOFOL N/A 11/27/2020   Surgeon: CMontez Morita DQuillian Quince MD; findings suggestive of short segment Barrett's, normal stomach, and small bowel.   FEMORAL ARTERY EXPLORATION Right 09/28/2020   Procedure: RIGHT GROIN EXPLORATION WITH REPAIR RIGHT COMMON FEMORAL ARTERY;  Surgeon: ERosetta Posner MD;  [facility];  Service: Vascular;  Laterality: Right;   GIVENS CAPSULE STUDY  05/09/2012   RMR: an unclear raised area of small bowel was noted, with features almost  characteristic of very small polyp. This was without villous  blunting or any  evidence of active bleeding; yet, the area of  concern appeared to be erythematous. However, this could simply  be a light reflection on a normal variation of the small bowel. REFERRED TO DR. GILLIAM, APPT FOR NOVEMBER 2013.    GIVENS CAPSULE STUDY N/A 04/30/2020   Procedure: GIVENS CAPSULE STUDY;  Surgeon: BOtis Brace MD;  [facility]  Service: Gastroenterology;  Laterality: N/A;   GPrichardSTUDY  11/27/2020    Surgeon: CMontez Morita DQuillian Quince MD;  questionable nodule/small mass in the distal small bowel but without ulceration.  Evidence of hematochezia throughout the colon but no fresh blood.    HIP ARTHROPLASTY Right    PATCH ANGIOPLASTY Right 10/21/2019   Procedure: PATCH ANGIOPLASTY USING XRueben BashBIOLOGIC PATCH;  Surgeon: CWaynetta Sandy MD;  [place] [facility]  Service: Vascular;  Laterality: Right;   POLYPECTOMY  04/28/2020   Procedure: POLYPECTOMY;  Surgeon: BOtis Brace MD;  [facility];  Service: Gastroenterology;;   RIGHT/LEFT HEART CATH AND CORONARY ANGIOGRAPHY N/A 09/08/2020   Procedure: RIGHT/LEFT HEART CATH AND CORONARY ANGIOGRAPHY;  Surgeon: CSherren Mocha MD;  [facility];  Service: Cardiovascular;  Laterality: N/A;   TEE WITHOUT CARDIOVERSION N/A 09/28/2020  Procedure: TRANSESOPHAGEAL ECHOCARDIOGRAM (TEE);  Surgeon: Sherren Mocha, MD;  Location: [place];  Service: Open Heart Surgery;  Laterality: N/A;   TONSILLECTOMY     TOTAL HIP ARTHROPLASTY  1995   Right   TRANSCATHETER AORTIC VALVE REPLACEMENT, TRANSFEMORAL N/A 09/28/2020   Procedure: TRANSCATHETER AORTIC VALVE REPLACEMENT, TRANSFEMORAL;  Surgeon: Sherren Mocha, MD;  Location: [place];  Service: Open Heart Surgery;  Laterality: N/A;    Current Outpatient Medications  Medication Sig Dispense Refill   ALPRAZolam (XANAX) 0.5 MG tablet Take 0.5 mg by mouth at bedtime as needed for anxiety.     Ascorbic Acid (VITAMIN C PO) Take 1 tablet by mouth daily.      cholecalciferol (VITAMIN D) 25 MCG (1000 UNIT) tablet Take 1,000 Units by mouth daily.     clopidogrel (PLAVIX) 75 MG tablet Take 1 tablet (75 mg total) by mouth daily with breakfast. 90 tablet 1   glimepiride (AMARYL) 1 MG tablet Take 1 mg by mouth daily with breakfast.     Glycerin-Hypromellose-PEG 400 (VISINE DRY EYE OP) Apply 1 drop to eye 3 (three) times daily as needed (for dry eye relief).      LUTEIN PO Take 1 capsule by mouth daily.     meclizine (ANTIVERT) 12.5 MG tablet Take 1 tablet (12.5 mg total) by mouth 3 (three) times daily as needed for dizziness. 20 tablet 0   metoprolol tartrate (LOPRESSOR) 25 MG tablet Take 0.5 tablets (12.5 mg total) by mouth 2 (two) times daily. 30 tablet 0   Multiple Vitamins-Minerals (PRESERVISION AREDS 2 PO) Take 1 tablet by mouth 2 (two) times daily. Takes 1 tablet in the morning and 1 tablet at night for eyes.     ondansetron (ZOFRAN) 4 MG tablet Take 4 mg by mouth every 6 (six) hours as needed.     pantoprazole (PROTONIX) 40 MG tablet Take 1 tablet (40 mg total) by mouth daily. 90 tablet 3   atorvastatin (LIPITOR) 40 MG tablet Take 1 tablet (40 mg total) by mouth daily at 6 PM. (Patient not taking: Reported on 06/08/2021) 30 tablet 2   ferrous sulfate 325 (65 FE) MG tablet Take 1 tablet (325 mg total) by mouth daily. (Patient not taking: Reported on 06/08/2021) 30 tablet 3   No current facility-administered medications for this visit.    Allergies as of 06/08/2021   (No Known Allergies)    Family History  Problem Relation Age of Onset   Cirrhosis Mother        no etoh use   CAD Father    Diabetes Mother        + brother   Heart failure Father    Hypertension Father        + brother   Cancer Brother        lung, age 61   Uterine cancer Sister    Colon cancer Neg Hx     Social History   Socioeconomic History   Marital status: Divorced    Spouse name: Not on file   Number of children: 4   Years of education: 6th grade   Highest  education level: Not on file  Occupational History   Occupation: Retired    Comment: [employer]  Tobacco Use   Smoking status: Never   Smokeless tobacco: Never   Tobacco comments:    Quit x 50 years  Vaping Use   Vaping Use: Never used  Substance and Sexual Activity   Alcohol use: Never   Drug use: Never  Sexual activity: Not Currently  Other Topics Concern   Not on file  Social History Narrative   Lives alone. Every other week she has family with her. Daughter and son take care of her during the days.    Right-handed.   No daily use of caffeine.             Social Determinants of Health   Financial Resource Strain: Not on file  Food Insecurity: Not on file  Transportation Needs: Not on file  Physical Activity: Not on file  Stress: Not on file  Social Connections: Not on file    Review of Systems: Gen: Denies fever, chills, cold or flu like symptoms, pre-syncope, or syncope.  CV: Denies chest pain, palpitations Resp: Mild SOB with exertion since COVID, no cough.  GI: See HPI Heme: See HPI  Physical Exam: BP (!) 116/52   Pulse 68   Temp (!) 96.9 F (36.1 C) (Temporal)   Ht 5' 6" (1.676 m)   Wt 163 lb 6.4 oz (74.1 kg)   BMI 26.37 kg/m  General:  Alert and oriented. No distress noted. Pleasant and cooperative.  Head:  Normocephalic and atraumatic. Eyes:  Conjuctiva clear without scleral icterus. Heart:  S1, S2 present without murmurs appreciated. Lungs:  Clear to auscultation bilaterally. No wheezes, rales, or rhonchi. No distress.  Abdomen:  +BS, soft, non-tender and non-distended. No rebound or guarding. No HSM or masses noted. Msk:  Symmetrical without gross deformities. Normal posture. Extremities:  Without edema. Neurologic:  Alert and  oriented x4 Psych:  Normal mood and affect.    Assessment: 87 year old female with history of diabetes, carotid artery stenosis, emphysema, GERD, CVA, hypertension, severe aortic stenosis status post TAVR, coronary artery  disease status post stent placement in December 2021 on Plavix, IDA with history of GI bleeding presenting today for routine follow-up.  IDA with history of GI bleeding: Prior evidence of GI bleeding in 2013 with heme positive stool and reports of melena and again in March 2022 with melena and symptomatic anemia.  Complete evaluation with EGD/colonoscopy/Givens capsule in 2013 with chronic gastritis, colonic diverticulosis, and small bowel lesion of unknown significance.  Repeat EGD/colonoscopy/Givens capsule March 2022 with findings suggestive of short segment Barrett's, two 2-4 mm colon polyps not removed in the setting of Plavix, diverticulosis, and questionable nodule/small mass in the distal small bowel without ulceration, hematochezia in the colon noted on capsule study, but no fresh blood.  Hemoglobin as low as 7.9 in March.  Most recent CBC 05/22/2021 with hemoglobin improved to 11.1, fairly stable compared to 4 months ago.  Iron panel May 2022 within normal limits, and patient reports she discontinued iron at that time.  She denies overt GI bleeding or other obvious blood loss.  Recently with dark stools when taking Molnupiravir for COVID-19 two weeks ago, now resolved.  Denies NSAIDs.  Remains on Plavix. Also on PPI daily.   We had discussed repeating a colonoscopy for polypectomy and possible deep intubation of TI to see if small bowel lesion was reachable, but patient has requested to hold off on further procedures unless absolutely necessary.  I feel this is reasonable considering her age.  We will continue to monitor hemoglobin and iron panel closely.   GERD: Well-controlled on Protonix 40 mg daily.  No alarm symptoms.  Abnormal LFTs: Mildly elevated LFTs on 05/22/2021 at the time of COVID-19 diagnosis.  AST 59, ALT 46.  Alk phos and total bilirubin within normal limits.  No history of  elevated liver enzymes.  She does take Tylenol for arthritis, but only 500 mg 3-4 times a week.  Overall,  suspect mild elevation was secondary to COVID-19/acute illness.  We will recheck at this time.   Plan: CBC, iron panel, HFP. Patient will continue to hold iron for now.  She may need to resume this in the future. Further recommendations to follow lab results.   Hold off on further endoscopic evaluation unless significant decline in hemoglobin/overt GI bleeding per patient's request. Continue Protonix 40 mg daily. Continue to avoid all NSAIDs. Follow-up in 6 months or sooner if needed.   Aliene Altes, PA-C [facility] Gastroenterology 06/08/2021

## 2021-06-08 ENCOUNTER — Other Ambulatory Visit: Payer: Self-pay

## 2021-06-08 ENCOUNTER — Ambulatory Visit (INDEPENDENT_AMBULATORY_CARE_PROVIDER_SITE_OTHER): Payer: Medicare Other | Admitting: Gastroenterology

## 2021-06-08 ENCOUNTER — Encounter: Payer: Self-pay | Admitting: Gastroenterology

## 2021-06-08 VITALS — BP 116/52 | HR 68 | Temp 96.9°F | Ht 66.0 in | Wt 163.4 lb

## 2021-06-08 DIAGNOSIS — K219 Gastro-esophageal reflux disease without esophagitis: Secondary | ICD-10-CM | POA: Diagnosis not present

## 2021-06-08 DIAGNOSIS — D509 Iron deficiency anemia, unspecified: Secondary | ICD-10-CM | POA: Diagnosis not present

## 2021-06-08 DIAGNOSIS — R7989 Other specified abnormal findings of blood chemistry: Secondary | ICD-10-CM | POA: Insufficient documentation

## 2021-06-08 DIAGNOSIS — R945 Abnormal results of liver function studies: Secondary | ICD-10-CM | POA: Insufficient documentation

## 2021-06-08 NOTE — Patient Instructions (Signed)
Have blood work completed at [facility]. We will call with results.   Continue Protonix 40 mg daily.   Continue to avoid all NSAID products.   We will see you back in 6 months. Do not hesitate to call if you have questions or concerns prior to your next visit.   It was great seeing you again today!   Aliene Altes, PA-C [facility] Gastroenter[facility]

## 2021-06-09 ENCOUNTER — Other Ambulatory Visit: Payer: Self-pay | Admitting: Gastroenterology

## 2021-06-09 DIAGNOSIS — D509 Iron deficiency anemia, unspecified: Secondary | ICD-10-CM

## 2021-06-09 LAB — IRON,TIBC AND FERRITIN PANEL
%SAT: 10 % (calc) — ABNORMAL LOW (ref 16–45)
Ferritin: 23 ng/mL (ref 16–288)
Iron: 36 ug/dL — ABNORMAL LOW (ref 45–160)
TIBC: 354 mcg/dL (calc) (ref 250–450)

## 2021-06-09 LAB — CBC WITH DIFFERENTIAL/PLATELET
Absolute Monocytes: 594 cells/uL (ref 200–950)
Basophils Absolute: 28 cells/uL (ref 0–200)
Basophils Relative: 0.5 %
Eosinophils Absolute: 73 cells/uL (ref 15–500)
Eosinophils Relative: 1.3 %
HCT: 31.8 % — ABNORMAL LOW (ref 35.0–45.0)
Hemoglobin: 10.3 g/dL — ABNORMAL LOW (ref 11.7–15.5)
Lymphs Abs: 1848 cells/uL (ref 850–3900)
MCH: 29.8 pg (ref 27.0–33.0)
MCHC: 32.4 g/dL (ref 32.0–36.0)
MCV: 91.9 fL (ref 80.0–100.0)
MPV: 9.7 fL (ref 7.5–12.5)
Monocytes Relative: 10.6 %
Neutro Abs: 3058 cells/uL (ref 1500–7800)
Neutrophils Relative %: 54.6 %
Platelets: 289 10*3/uL (ref 140–400)
RBC: 3.46 10*6/uL — ABNORMAL LOW (ref 3.80–5.10)
RDW: 13.4 % (ref 11.0–15.0)
Total Lymphocyte: 33 %
WBC: 5.6 10*3/uL (ref 3.8–10.8)

## 2021-06-09 LAB — HEPATIC FUNCTION PANEL
AG Ratio: 1.5 (calc) (ref 1.0–2.5)
ALT: 18 U/L (ref 6–29)
AST: 24 U/L (ref 10–35)
Albumin: 3.8 g/dL (ref 3.6–5.1)
Alkaline phosphatase (APISO): 68 U/L (ref 37–153)
Bilirubin, Direct: 0.1 mg/dL (ref 0.0–0.2)
Globulin: 2.6 g/dL (calc) (ref 1.9–3.7)
Indirect Bilirubin: 0.6 mg/dL (calc) (ref 0.2–1.2)
Total Bilirubin: 0.7 mg/dL (ref 0.2–1.2)
Total Protein: 6.4 g/dL (ref 6.1–8.1)

## 2021-07-08 ENCOUNTER — Other Ambulatory Visit: Payer: Self-pay | Admitting: Gastroenterology

## 2021-07-09 LAB — CBC WITH DIFFERENTIAL/PLATELET
Absolute Monocytes: 817 cells/uL (ref 200–950)
Basophils Absolute: 21 cells/uL (ref 0–200)
Basophils Relative: 0.3 %
Eosinophils Absolute: 78 cells/uL (ref 15–500)
Eosinophils Relative: 1.1 %
HCT: 35.6 % (ref 35.0–45.0)
Hemoglobin: 11.5 g/dL — ABNORMAL LOW (ref 11.7–15.5)
Lymphs Abs: 2208 cells/uL (ref 850–3900)
MCH: 30 pg (ref 27.0–33.0)
MCHC: 32.3 g/dL (ref 32.0–36.0)
MCV: 93 fL (ref 80.0–100.0)
MPV: 10.2 fL (ref 7.5–12.5)
Monocytes Relative: 11.5 %
Neutro Abs: 3976 cells/uL (ref 1500–7800)
Neutrophils Relative %: 56 %
Platelets: 233 10*3/uL (ref 140–400)
RBC: 3.83 10*6/uL (ref 3.80–5.10)
RDW: 15.7 % — ABNORMAL HIGH (ref 11.0–15.0)
Total Lymphocyte: 31.1 %
WBC: 7.1 10*3/uL (ref 3.8–10.8)

## 2021-07-09 LAB — IRON,TIBC AND FERRITIN PANEL
%SAT: 30 % (calc) (ref 16–45)
Ferritin: 28 ng/mL (ref 16–288)
Iron: 107 ug/dL (ref 45–160)
TIBC: 358 mcg/dL (calc) (ref 250–450)

## 2021-07-11 ENCOUNTER — Encounter (HOSPITAL_COMMUNITY): Payer: Self-pay | Admitting: Emergency Medicine

## 2021-07-11 ENCOUNTER — Emergency Department (HOSPITAL_COMMUNITY): Payer: Medicare Other

## 2021-07-11 ENCOUNTER — Other Ambulatory Visit: Payer: Self-pay

## 2021-07-11 ENCOUNTER — Inpatient Hospital Stay (HOSPITAL_COMMUNITY)
Admission: EM | Admit: 2021-07-11 | Discharge: 2021-07-13 | DRG: 244 | Disposition: A | Discharge status: Home / Self Care | Payer: Medicare Other | Attending: Internal Medicine | Admitting: Internal Medicine

## 2021-07-11 DIAGNOSIS — Z7984 Long term (current) use of oral hypoglycemic drugs: Secondary | ICD-10-CM

## 2021-07-11 DIAGNOSIS — K219 Gastro-esophageal reflux disease without esophagitis: Secondary | ICD-10-CM | POA: Diagnosis present

## 2021-07-11 DIAGNOSIS — Z79899 Other long term (current) drug therapy: Secondary | ICD-10-CM

## 2021-07-11 DIAGNOSIS — Z8673 Personal history of transient ischemic attack (TIA), and cerebral infarction without residual deficits: Secondary | ICD-10-CM

## 2021-07-11 DIAGNOSIS — Z833 Family history of diabetes mellitus: Secondary | ICD-10-CM

## 2021-07-11 DIAGNOSIS — Z953 Presence of xenogenic heart valve: Secondary | ICD-10-CM

## 2021-07-11 DIAGNOSIS — Z8049 Family history of malignant neoplasm of other genital organs: Secondary | ICD-10-CM

## 2021-07-11 DIAGNOSIS — I10 Essential (primary) hypertension: Secondary | ICD-10-CM | POA: Diagnosis present

## 2021-07-11 DIAGNOSIS — I442 Atrioventricular block, complete: Secondary | ICD-10-CM | POA: Diagnosis not present

## 2021-07-11 DIAGNOSIS — Z950 Presence of cardiac pacemaker: Secondary | ICD-10-CM

## 2021-07-11 DIAGNOSIS — Z96641 Presence of right artificial hip joint: Secondary | ICD-10-CM | POA: Diagnosis present

## 2021-07-11 DIAGNOSIS — Z8249 Family history of ischemic heart disease and other diseases of the circulatory system: Secondary | ICD-10-CM

## 2021-07-11 DIAGNOSIS — E119 Type 2 diabetes mellitus without complications: Secondary | ICD-10-CM | POA: Diagnosis present

## 2021-07-11 DIAGNOSIS — Z7902 Long term (current) use of antithrombotics/antiplatelets: Secondary | ICD-10-CM

## 2021-07-11 DIAGNOSIS — F419 Anxiety disorder, unspecified: Secondary | ICD-10-CM | POA: Diagnosis present

## 2021-07-11 DIAGNOSIS — I959 Hypotension, unspecified: Secondary | ICD-10-CM | POA: Diagnosis present

## 2021-07-11 DIAGNOSIS — Z8616 Personal history of COVID-19: Secondary | ICD-10-CM

## 2021-07-11 DIAGNOSIS — Z955 Presence of coronary angioplasty implant and graft: Secondary | ICD-10-CM

## 2021-07-11 DIAGNOSIS — I2510 Atherosclerotic heart disease of native coronary artery without angina pectoris: Secondary | ICD-10-CM | POA: Diagnosis present

## 2021-07-11 DIAGNOSIS — M810 Age-related osteoporosis without current pathological fracture: Secondary | ICD-10-CM | POA: Diagnosis present

## 2021-07-11 DIAGNOSIS — I350 Nonrheumatic aortic (valve) stenosis: Secondary | ICD-10-CM | POA: Diagnosis present

## 2021-07-11 DIAGNOSIS — E785 Hyperlipidemia, unspecified: Secondary | ICD-10-CM | POA: Diagnosis present

## 2021-07-11 NOTE — ED Triage Notes (Signed)
Pt BIB [facility] from home, pt c/o tingling to her forehead and right arm, and feeling like her heart was skipping. Found to be in 2nd degree type 2 HB, rate of 20s by EMS, given 2 doses of atropine by EMS with no response. Hypotensive, SBP 80, currently being paced.

## 2021-07-11 NOTE — ED Provider Notes (Signed)
[facility] EMERGENCY DEPARTMENT Provider Note   CSN: [id number] Arrival date & time: 07/11/21  2231     History Chief Complaint  Patient presents with   Bradycardia    Denise Parrish is a 87 y.o. female.  The history is provided by the patient and a relative.   Patient is a 87 year old female with a PMH significant for type II DM, CVA, HTN, HLD, s/p TAVR in 2022, and others as below who presents to the ED via EMS with complaints of dizziness.  Patient reports that at approximately 2:00 PM today, she had a sensation of a "electric shock" across her head and down her extremities that started suddenly.  She had a session of dizziness and nausea associated.  EMS was called, patient was found to be bradycardic with a rate of 20 bpm.  EKG was notable for reported second-degree type II heart block.  Patient was given atropine x2 without relief, and was subsequently placed on transcutaneous pacing.  She was initially hypotensive per EMS.  On arrival, the patient is asymptomatic and currently being paced.  She denies any syncope, chest pain or palpitations, abdominal pain, nausea, visual changes, or any other concerns.  She does take Plavix, but denies any other anticoagulation.  She denies any prior episodes similar to this in the past.   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   s/p TAVR with a 30m Medtronic  Evolut Pro+ via the TF approach    Severe aortic stenosis    Sigmoid diverticulosis    Small bowel lesion    On Given's capsule; Dr GKathleene Hazel11/2013, no further w/u needed    Patient Active Problem List   Diagnosis Date Noted   Abnormal LFTs 06/08/2021   Anemia 01/19/2021   Acute blood loss anemia 11/26/2020   Melena    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   History of CVA (cerebrovascular accident)    Severe aortic stenosis 09/08/2020   Closed left ankle fracture, initial encounter 05/04/2020   Essential hypertension 10/16/2019   Anxiety 12/08/2017   Gastroesophageal reflux disease 03/14/2012   Iron deficiency anemia 10/28/2011   Diabetes mellitus, type 2 ([facility]    Hypertension    Hyperlipidemia    Obesity     Past Surgical History:  Procedure Laterality Date   ANTERIOR CERVICAL DISCECTOMY  2001   C4-5, allograft, fixation   BREAST LUMPECTOMY     benign   CATARACT EXTRACTION W/ INTRAOCULAR LENS IMPLANT  2007   Left   COLONOSCOPY  2006   COLONOSCOPY  04/03/12   Dr. ROk Edwardsdiverticulosis, negative microscopic  colitis    COLONOSCOPY WITH PROPOFOL N/A 11/27/2020   Surgeon: CHarvel Quale MD;  diverticulosis, no active bleeding.  Two 2-4 mm polyps not removed  in the setting of Plavix.   CORONARY STENT INTERVENTION N/A 09/08/2020   Procedure: CORONARY STENT INTERVENTION;  Surgeon: Sherren Mocha, MD;  Location: [facility];  Service: Cardiovascular;  Laterality: N/A;   ENDARTERECTOMY Right 10/21/2019   Procedure: ENDARTERECTOMY CAROTID RIGHT;  Surgeon: Waynetta Sandy, MD;  Location: [facility];  Service: Vascular;  Laterality: Right;   ESOPHAGOGASTRODUODENOSCOPY  04/03/12   Dr. Jennet Maduro hernia, chronic gastritis on bx   ESOPHAGOGASTRODUODENOSCOPY (EGD) WITH PROPOFOL N/A 04/28/2020   Procedure: ESOPHAGOGASTRODUODENOSCOPY (EGD) WITH PROPOFOL;  Surgeon: Otis Brace, MD;  Location: [facility];  Service:  Gastroenterology;  Laterality: N/A;   ESOPHAGOGASTRODUODENOSCOPY (EGD) WITH PROPOFOL N/A 11/27/2020   Surgeon: Montez Morita, Quillian Quince, MD; findings suggestive of short segment Barrett's, normal stomach, and small bowel.   FEMORAL ARTERY EXPLORATION Right 09/28/2020   Procedure: RIGHT GROIN EXPLORATION WITH REPAIR RIGHT COMMON FEMORAL ARTERY;  Surgeon: [name], MD;  Location: [facility];  Service: Vascular;  Laterality: Right;   GIVENS CAPSULE STUDY  05/09/2012   RMR: an unclear raised area of small bowel was noted, with features almost  characteristic of very small polyp. This was without villous  blunting or any evidence of active bleeding; yet, the area of  concern appeared to be erythematous. However, this could simply  be a light reflection on a normal variation of the small bowel. REFERRED TO DR. GILLIAM, APPT FOR NOVEMBER 2013.    GIVENS CAPSULE STUDY N/A 04/30/2020   Procedure: GIVENS CAPSULE STUDY;  Surgeon: Otis Brace, MD;  Location: [facility];  Service: Gastroenterology;  Laterality: N/A;   [facility] STUDY  11/27/2020    Surgeon: Montez Morita, Quillian Quince, MD;  questionable nodule/small mass in the distal small bowel but without ulceration.  Evidence of hematochezia throughout the colon but no fresh blood.    HIP ARTHROPLASTY Right    PATCH ANGIOPLASTY Right 10/21/2019   Procedure: PATCH ANGIOPLASTY USING Rueben Bash BIOLOGIC PATCH;  Surgeon: Waynetta Sandy, MD;  Location: [facility];  Service: Vascular;  Laterality: Right;   POLYPECTOMY  04/28/2020   Procedure: POLYPECTOMY;  Surgeon: Otis Brace, MD;  Location: [facility];  Service: Gastroenterology;;   RIGHT/LEFT HEART CATH AND CORONARY ANGIOGRAPHY N/A 09/08/2020   Procedure: RIGHT/LEFT HEART CATH AND CORONARY ANGIOGRAPHY;  Surgeon: Sherren Mocha, MD;  Location: [facility];  Service: Cardiovascular;  Laterality: N/A;   TEE WITHOUT CARDIOVERSION N/A 09/28/2020   Procedure: TRANSESOPHAGEAL ECHOCARDIOGRAM  (TEE);  Surgeon: Sherren Mocha, MD;  Location: [facility];  Service: Open Heart Surgery;  Laterality: N/A;   TONSILLECTOMY     TOTAL HIP ARTHROPLASTY  1995   Right   TRANSCATHETER AORTIC VALVE REPLACEMENT, TRANSFEMORAL N/A 09/28/2020   Procedure: TRANSCATHETER AORTIC VALVE REPLACEMENT, TRANSFEMORAL;  Surgeon: Sherren Mocha, MD;  Location: [facility];  Service: Open Heart Surgery;  Laterality: N/A;     OB History   No obstetric history on file.     Family History  Problem Relation Age of Onset   Cirrhosis Mother        no etoh use   CAD Father    Diabetes Mother        + brother   Heart failure Father    Hypertension Father        + brother   Cancer Brother        lung, age 45   Uterine cancer Sister    Colon cancer Neg Hx     Social History   Tobacco Use   Smoking  status: Never   Smokeless tobacco: Never   Tobacco comments:    Quit x 50 years  Vaping Use   Vaping Use: Never used  Substance Use Topics   Alcohol use: Never   Drug use: Never    Home Medications Prior to Admission medications   Medication Sig Start Date End Date Taking? Authorizing Provider  ALPRAZolam Duanne Moron) 0.5 MG tablet Take 0.5 mg by mouth at bedtime as needed for anxiety.    [provider]  Ascorbic Acid (VITAMIN C PO) Take 1 tablet by mouth Parrish.    [provider]  atorvastatin (LIPITOR) 40 MG tablet Take 1 tablet (40 mg total) by mouth Parrish at 6 PM. Patient not taking: Reported on 06/08/2021 10/22/19   Georgette Shell, MD  cholecalciferol (VITAMIN D) 25 MCG (1000 UNIT) tablet Take 1,000 Units by mouth Parrish.    [provider]  clopidogrel (PLAVIX) 75 MG tablet Take 1 tablet (75 mg total) by mouth Parrish with breakfast. 10/06/20   Eileen Stanford, PA-C  ferrous sulfate 325 (65 FE) MG tablet Take 1 tablet (325 mg total) by mouth Parrish. Patient not taking: Reported on 06/08/2021 01/20/21 01/20/22  Erenest Rasher, PA-C  glimepiride (AMARYL) 1 MG tablet Take 1 mg by  mouth Parrish with breakfast.    [provider]  Glycerin-Hypromellose-PEG 400 (VISINE DRY EYE OP) Apply 1 drop to eye 3 (three) times Parrish as needed (for dry eye relief).     [provider]  LUTEIN PO Take 1 capsule by mouth Parrish.    [provider]  meclizine (ANTIVERT) 12.5 MG tablet Take 1 tablet (12.5 mg total) by mouth 3 (three) times Parrish as needed for dizziness. 12/14/19   Virgel Manifold, MD  metoprolol tartrate (LOPRESSOR) 25 MG tablet Take 0.5 tablets (12.5 mg total) by mouth 2 (two) times Parrish. 05/07/20   Mikhail, Velta Addison, DO  Multiple Vitamins-Minerals (PRESERVISION AREDS 2 PO) Take 1 tablet by mouth 2 (two) times Parrish. Takes 1 tablet in the morning and 1 tablet at night for eyes.    [provider]  ondansetron (ZOFRAN) 4 MG tablet Take 4 mg by mouth every 6 (six) hours as needed. 04/18/21   [provider]  pantoprazole (PROTONIX) 40 MG tablet Take 1 tablet (40 mg total) by mouth Parrish. 09/09/20   Sherren Mocha, MD    Allergies    Patient has no known allergies.  Review of Systems   Review of Systems  Constitutional:  Negative for activity change, appetite change, chills and fever.  HENT:  Negative for trouble swallowing.   Eyes:  Negative for pain and visual disturbance.  Respiratory:  Negative for cough, chest tightness and shortness of breath.   Cardiovascular:  Negative for chest pain, palpitations and leg swelling.  Gastrointestinal:  Negative for abdominal distention, abdominal pain, constipation, diarrhea, nausea and vomiting.  Skin:  Negative for color change, pallor and rash.  Neurological:  Positive for dizziness and numbness. Negative for tremors, seizures, syncope, facial asymmetry, speech difficulty, weakness, light-headedness and headaches.  Psychiatric/Behavioral:  Negative for confusion. The patient is not nervous/anxious.   All other systems reviewed and are negative.  Physical Exam Updated Vital Signs BP (!)  136/92   Pulse 74   Temp (!) 96.8 F (36 C) (Temporal)   Resp 19   SpO2 100%   Physical Exam Vitals and nursing note reviewed.  Constitutional:      General: She is not in acute distress.  Appearance: Normal appearance. She is well-developed and normal weight. She is not ill-appearing, toxic-appearing or diaphoretic.  HENT:     Head: Normocephalic and atraumatic.     Right Ear: External ear normal.     Left Ear: External ear normal.     Nose: Nose normal.     Mouth/Throat:     Mouth: Mucous membranes are moist.     Pharynx: Oropharynx is clear.  Eyes:     General: No scleral icterus.    Extraocular Movements: Extraocular movements intact.     Conjunctiva/sclera: Conjunctivae normal.     Pupils: Pupils are equal, round, and reactive to light.  Cardiovascular:     Rate and Rhythm: Normal rate and regular rhythm.     Pulses: Normal pulses.     Heart sounds: Normal heart sounds. No murmur heard.    Comments: Warm and well-perfused Pulmonary:     Effort: Pulmonary effort is normal. No respiratory distress.     Breath sounds: Normal breath sounds.  Abdominal:     General: There is no distension.     Palpations: Abdomen is soft.     Tenderness: There is no abdominal tenderness. There is no guarding or rebound.  Musculoskeletal:        General: Normal range of motion.     Cervical back: Normal range of motion and neck supple. No rigidity or tenderness.     Right lower leg: No edema.     Left lower leg: No edema.  Lymphadenopathy:     Cervical: No cervical adenopathy.  Skin:    General: Skin is warm and dry.     Capillary Refill: Capillary refill takes less than 2 seconds.  Neurological:     General: No focal deficit present.     Mental Status: She is alert and oriented to person, place, and time. Mental status is at baseline.     GCS: GCS eye subscore is 4. GCS verbal subscore is 5. GCS motor subscore is 6.  Psychiatric:        Attention and Perception: Attention and  perception normal.        Mood and Affect: Mood normal.        Behavior: Behavior normal.    ED Results / Procedures / Treatments   Labs (all labs ordered are listed, but only abnormal results are displayed) Labs Reviewed  CBC WITH DIFFERENTIAL/PLATELET - Abnormal; Notable for the following components:      Result Value   RBC 3.81 (*)    Hemoglobin 11.4 (*)    RDW 16.8 (*)    All other components within normal limits  RESP PANEL BY RT-PCR (FLU A&B, COVID) ARPGX2  PROTIME-INR  BASIC METABOLIC PANEL  MAGNESIUM  TSH  TROPONIN I (HIGH SENSITIVITY)  TROPONIN I (HIGH SENSITIVITY)    EKG None  Radiology CT Head Wo Contrast  Result Date: 07/11/2021 CLINICAL DATA:  Head pain tingling to forehead and or EXAM: CT HEAD WITHOUT CONTRAST TECHNIQUE: Contiguous axial images were obtained from the base of the skull through the vertex without intravenous contrast. COMPARISON:  CT brain 04/07/2020, MRI 04/07/2020 FINDINGS: Brain: No acute territorial infarction, hemorrhage, or intracranial mass. Atrophy and chronic small vessel ischemic changes of the white matter. Remote right frontal and left cerebellar infarcts. Stable ventricle size. Vascular: No hyperdense vessels.  Carotid vascular calcification. Skull: Normal. Negative for fracture or focal lesion. Sinuses/Orbits: Debris in the sinuses. Other: Incomplete fusion posterior arch of C1 IMPRESSION: 1. No CT evidence for  acute intracranial abnormality. 2. Atrophy and chronic small vessel ischemic changes of the white matter. Stable small chronic infarcts in the right posterior frontal lobe and left cerebellum Electronically Signed   By: Donavan Foil M.D.   On: 07/11/2021 23:36   DG Chest Portable 1 View  Result Date: 07/11/2021 CLINICAL DATA:  Bradycardia and palpitations. EXAM: PORTABLE CHEST 1 VIEW COMPARISON:  Chest x-ray 05/22/2021. FINDINGS: The heart size and mediastinal contours are within normal limits. Both lungs are clear. Cervical  spinal fusion plate is visualized. No acute fractures are seen. IMPRESSION: No active disease. Electronically Signed   By: Ronney Asters M.D.   On: 07/11/2021 23:14    Procedures Procedures   Medications Ordered in ED Medications - No data to display  ED Course  I have reviewed the triage vital signs and the nursing notes.  Pertinent labs & imaging results that were available during my care of the patient were reviewed by me and considered in my medical decision making (see chart for details).    MDM Rules/Calculators/A&P                           Patient is a 87 year old female who is s/p TAVR presents to the ED with traumatic bradycardia.  Being paced on arrival with a rate above 60 bpm and normotension.  Not hypoxic on room air and asymptomatic on exam.  EKG interpretation: Sinus rhythm, normal intervals, no ST elevations or depressions, no T wave inversions.  Paced rhythm.  Labs: pending at shift change  Imaging: CT head negative for acute findings.  CXR negative for acute findings.  DDx considered: Electrolyte disturbance, ACS, thyroid dysfunction, sepsis, heart block.  Labs pending at shift change.  Patient denying any chest pain, no ST elevations on EKG.  No systemic infectious signs or symptoms.  No signs of trauma on exam.  Final diagnosis pending additional work-up.  Suspect heart block secondary to reported second-degree heart block by EMS.  On reassessment, patient is resting comfortably and asymptomatic.  Rate 74 bpm, not currently being paced.  Hemodynamically stable and in no acute distress.  Cardiology consulted, recommendations are pending.  Oncoming provider to follow-up on pending labs and cardiology recommendations.  Anticipate admission, final disposition pending.  Care transferred to oncoming provider at shift change.  History, work-up, and care plans thus far communicated to oncoming team to the best my ability.    Note: Presenter, broadcasting used in  creation of this note.  Attending physician participated in the evaluation and plan for care of this patien    Final Clinical Impression(s) / ED Diagnoses Final diagnoses:  None    Rx / DC Orders ED Discharge Orders     None        Cherly Hensen, DO 07/12/21 [id number]    Elnora Morrison, MD 07/12/21 [phone]

## 2021-07-12 ENCOUNTER — Encounter (HOSPITAL_COMMUNITY): Payer: Self-pay | Admitting: Internal Medicine

## 2021-07-12 ENCOUNTER — Other Ambulatory Visit: Payer: Self-pay | Admitting: *Deleted

## 2021-07-12 ENCOUNTER — Telehealth: Payer: Self-pay | Admitting: *Deleted

## 2021-07-12 ENCOUNTER — Inpatient Hospital Stay (HOSPITAL_COMMUNITY): Payer: Medicare Other

## 2021-07-12 ENCOUNTER — Inpatient Hospital Stay (HOSPITAL_COMMUNITY)
Admission: EM | Disposition: A | Discharge status: Home / Self Care | Payer: Self-pay | Source: Home / Self Care | Attending: Internal Medicine

## 2021-07-12 DIAGNOSIS — I442 Atrioventricular block, complete: Principal | ICD-10-CM

## 2021-07-12 DIAGNOSIS — Z8616 Personal history of COVID-19: Secondary | ICD-10-CM | POA: Diagnosis not present

## 2021-07-12 DIAGNOSIS — Z955 Presence of coronary angioplasty implant and graft: Secondary | ICD-10-CM | POA: Diagnosis not present

## 2021-07-12 DIAGNOSIS — Z96641 Presence of right artificial hip joint: Secondary | ICD-10-CM | POA: Diagnosis present

## 2021-07-12 DIAGNOSIS — F419 Anxiety disorder, unspecified: Secondary | ICD-10-CM | POA: Diagnosis present

## 2021-07-12 DIAGNOSIS — E785 Hyperlipidemia, unspecified: Secondary | ICD-10-CM | POA: Diagnosis present

## 2021-07-12 DIAGNOSIS — Z8249 Family history of ischemic heart disease and other diseases of the circulatory system: Secondary | ICD-10-CM | POA: Diagnosis not present

## 2021-07-12 DIAGNOSIS — E611 Iron deficiency: Secondary | ICD-10-CM

## 2021-07-12 DIAGNOSIS — K219 Gastro-esophageal reflux disease without esophagitis: Secondary | ICD-10-CM | POA: Diagnosis present

## 2021-07-12 DIAGNOSIS — Z8049 Family history of malignant neoplasm of other genital organs: Secondary | ICD-10-CM | POA: Diagnosis not present

## 2021-07-12 DIAGNOSIS — Z833 Family history of diabetes mellitus: Secondary | ICD-10-CM | POA: Diagnosis not present

## 2021-07-12 DIAGNOSIS — I959 Hypotension, unspecified: Secondary | ICD-10-CM | POA: Diagnosis present

## 2021-07-12 DIAGNOSIS — D509 Iron deficiency anemia, unspecified: Secondary | ICD-10-CM

## 2021-07-12 DIAGNOSIS — Z7902 Long term (current) use of antithrombotics/antiplatelets: Secondary | ICD-10-CM | POA: Diagnosis not present

## 2021-07-12 DIAGNOSIS — I350 Nonrheumatic aortic (valve) stenosis: Secondary | ICD-10-CM | POA: Diagnosis present

## 2021-07-12 DIAGNOSIS — I10 Essential (primary) hypertension: Secondary | ICD-10-CM | POA: Diagnosis present

## 2021-07-12 DIAGNOSIS — Z7984 Long term (current) use of oral hypoglycemic drugs: Secondary | ICD-10-CM | POA: Diagnosis not present

## 2021-07-12 DIAGNOSIS — Z8673 Personal history of transient ischemic attack (TIA), and cerebral infarction without residual deficits: Secondary | ICD-10-CM | POA: Diagnosis not present

## 2021-07-12 DIAGNOSIS — M810 Age-related osteoporosis without current pathological fracture: Secondary | ICD-10-CM | POA: Diagnosis present

## 2021-07-12 DIAGNOSIS — Z953 Presence of xenogenic heart valve: Secondary | ICD-10-CM | POA: Diagnosis not present

## 2021-07-12 DIAGNOSIS — R7989 Other specified abnormal findings of blood chemistry: Secondary | ICD-10-CM

## 2021-07-12 DIAGNOSIS — Z79899 Other long term (current) drug therapy: Secondary | ICD-10-CM | POA: Diagnosis not present

## 2021-07-12 DIAGNOSIS — I2510 Atherosclerotic heart disease of native coronary artery without angina pectoris: Secondary | ICD-10-CM | POA: Diagnosis present

## 2021-07-12 DIAGNOSIS — E119 Type 2 diabetes mellitus without complications: Secondary | ICD-10-CM | POA: Diagnosis present

## 2021-07-12 HISTORY — PX: PACEMAKER IMPLANT: EP1218

## 2021-07-12 LAB — CBC WITH DIFFERENTIAL/PLATELET
Abs Immature Granulocytes: 0.03 10*3/uL (ref 0.00–0.07)
Basophils Absolute: 0 10*3/uL (ref 0.0–0.1)
Basophils Relative: 0 %
Eosinophils Absolute: 0.1 10*3/uL (ref 0.0–0.5)
Eosinophils Relative: 2 %
HCT: 37.2 % (ref 36.0–46.0)
Hemoglobin: 11.4 g/dL — ABNORMAL LOW (ref 12.0–15.0)
Immature Granulocytes: 1 %
Lymphocytes Relative: 26 %
Lymphs Abs: 1.6 10*3/uL (ref 0.7–4.0)
MCH: 29.9 pg (ref 26.0–34.0)
MCHC: 30.6 g/dL (ref 30.0–36.0)
MCV: 97.6 fL (ref 80.0–100.0)
Monocytes Absolute: 0.7 10*3/uL (ref 0.1–1.0)
Monocytes Relative: 11 %
Neutro Abs: 3.7 10*3/uL (ref 1.7–7.7)
Neutrophils Relative %: 60 %
Platelets: 196 10*3/uL (ref 150–400)
RBC: 3.81 MIL/uL — ABNORMAL LOW (ref 3.87–5.11)
RDW: 16.8 % — ABNORMAL HIGH (ref 11.5–15.5)
WBC: 6.2 10*3/uL (ref 4.0–10.5)
nRBC: 0 % (ref 0.0–0.2)

## 2021-07-12 LAB — COMPREHENSIVE METABOLIC PANEL
ALT: 15 U/L (ref 0–44)
AST: 25 U/L (ref 15–41)
Albumin: 3 g/dL — ABNORMAL LOW (ref 3.5–5.0)
Alkaline Phosphatase: 49 U/L (ref 38–126)
Anion gap: 5 (ref 5–15)
BUN: 20 mg/dL (ref 8–23)
CO2: 28 mmol/L (ref 22–32)
Calcium: 8.9 mg/dL (ref 8.9–10.3)
Chloride: 105 mmol/L (ref 98–111)
Creatinine, Ser: 1.19 mg/dL — ABNORMAL HIGH (ref 0.44–1.00)
GFR, Estimated: 42 mL/min — ABNORMAL LOW (ref >60)
Glucose, Bld: 112 mg/dL — ABNORMAL HIGH (ref 70–99)
Potassium: 4.1 mmol/L (ref 3.5–5.1)
Sodium: 138 mmol/L (ref 135–145)
Total Bilirubin: 0.8 mg/dL (ref 0.3–1.2)
Total Protein: 5.6 g/dL — ABNORMAL LOW (ref 6.5–8.1)

## 2021-07-12 LAB — BASIC METABOLIC PANEL
Anion gap: 10 (ref 5–15)
BUN: 24 mg/dL — ABNORMAL HIGH (ref 8–23)
CO2: 24 mmol/L (ref 22–32)
Calcium: 8.9 mg/dL (ref 8.9–10.3)
Chloride: 104 mmol/L (ref 98–111)
Creatinine, Ser: 1.19 mg/dL — ABNORMAL HIGH (ref 0.44–1.00)
GFR, Estimated: 42 mL/min — ABNORMAL LOW (ref >60)
Glucose, Bld: 123 mg/dL — ABNORMAL HIGH (ref 70–99)
Potassium: 4.6 mmol/L (ref 3.5–5.1)
Sodium: 138 mmol/L (ref 135–145)

## 2021-07-12 LAB — ECHOCARDIOGRAM COMPLETE
AR max vel: 2.61 cm2
AV Area VTI: 2.57 cm2
AV Area mean vel: 2.41 cm2
AV Mean grad: 7.5 mmHg
AV Peak grad: 12.9 mmHg
Ao pk vel: 1.8 m/s
Area-P 1/2: 2.56 cm2
MV VTI: 1.64 cm2
S' Lateral: 2.8 cm

## 2021-07-12 LAB — BRAIN NATRIURETIC PEPTIDE: B Natriuretic Peptide: 200.2 pg/mL — ABNORMAL HIGH (ref 0.0–100.0)

## 2021-07-12 LAB — SURGICAL PCR SCREEN
MRSA, PCR: NEGATIVE
Staphylococcus aureus: NEGATIVE

## 2021-07-12 LAB — TROPONIN I (HIGH SENSITIVITY): Troponin I (High Sensitivity): 11 ng/L (ref <18)

## 2021-07-12 LAB — PROTIME-INR
INR: 1 (ref 0.8–1.2)
Prothrombin Time: 12.9 seconds (ref 11.4–15.2)

## 2021-07-12 LAB — TSH
TSH: 5.126 u[IU]/mL — ABNORMAL HIGH (ref 0.350–4.500)
TSH: 4.634 u[IU]/mL — ABNORMAL HIGH (ref 0.350–4.500)

## 2021-07-12 LAB — RESP PANEL BY RT-PCR (FLU A&B, COVID) ARPGX2
Influenza A by PCR: NEGATIVE
Influenza B by PCR: NEGATIVE
SARS Coronavirus 2 by RT PCR: NEGATIVE

## 2021-07-12 LAB — GLUCOSE, CAPILLARY: Glucose-Capillary: 101 mg/dL — ABNORMAL HIGH (ref 70–99)

## 2021-07-12 LAB — MAGNESIUM: Magnesium: 2.1 mg/dL (ref 1.7–2.4)

## 2021-07-12 LAB — T4, FREE: Free T4: 0.85 ng/dL (ref 0.61–1.12)

## 2021-07-12 SURGERY — PACEMAKER IMPLANT

## 2021-07-12 MED ORDER — SODIUM CHLORIDE 0.9% FLUSH: 3.0000 mL | Freq: Two times a day (BID) | INTRAVENOUS | Status: DC

## 2021-07-12 MED ORDER — CHLORHEXIDINE GLUCONATE 4 % EX LIQD: 60.0000 mL | Freq: Once | CUTANEOUS | Status: DC

## 2021-07-12 MED ORDER — LIDOCAINE HCL (PF) 1 % IJ SOLN
INTRAMUSCULAR | Status: AC
  Filled 2021-07-12: qty 30

## 2021-07-12 MED ORDER — HEPARIN SODIUM (PORCINE) 5000 UNIT/ML IJ SOLN: 5000.0000 [IU] | Freq: Three times a day (TID) | INTRAMUSCULAR | Status: DC

## 2021-07-12 MED ORDER — ONDANSETRON HCL 4 MG/2ML IJ SOLN: 4.0000 mg | Freq: Four times a day (QID) | INTRAMUSCULAR | Status: DC | PRN

## 2021-07-12 MED ORDER — SODIUM CHLORIDE 0.9% FLUSH
3.0000 mL | Freq: Two times a day (BID) | INTRAVENOUS | Status: DC
  Administered 2021-07-12 (×2): 3 mL via INTRAVENOUS

## 2021-07-12 MED ORDER — ACETAMINOPHEN 325 MG PO TABS
650.0000 mg | ORAL_TABLET | Freq: Four times a day (QID) | ORAL | Status: DC | PRN
  Administered 2021-07-13: 650 mg via ORAL
  Filled 2021-07-12: qty 2

## 2021-07-12 MED ORDER — ATORVASTATIN CALCIUM 40 MG PO TABS
40.0000 mg | ORAL_TABLET | Freq: Every day | ORAL | Status: DC
  Administered 2021-07-12: 40 mg via ORAL
  Filled 2021-07-12: qty 1

## 2021-07-12 MED ORDER — SODIUM CHLORIDE 0.9 % IV SOLN
INTRAVENOUS | Status: AC
  Filled 2021-07-12: qty 2

## 2021-07-12 MED ORDER — SODIUM CHLORIDE 0.9 % IV SOLN
INTRAVENOUS | Status: DC
Start: 2021-07-12 — End: 2021-07-12

## 2021-07-12 MED ORDER — CLOPIDOGREL BISULFATE 75 MG PO TABS: 75.0000 mg | ORAL_TABLET | Freq: Every day | ORAL | Status: DC

## 2021-07-12 MED ORDER — SODIUM CHLORIDE 0.9 % IV SOLN
80.0000 mg | INTRAVENOUS | Status: AC
  Administered 2021-07-12: 80 mg
  Filled 2021-07-12: qty 2

## 2021-07-12 MED ORDER — HEPARIN (PORCINE) IN NACL 1000-0.9 UT/500ML-% IV SOLN
INTRAVENOUS | Status: AC
  Filled 2021-07-12: qty 500

## 2021-07-12 MED ORDER — ALPRAZOLAM 0.5 MG PO TABS
0.5000 mg | ORAL_TABLET | Freq: Once | ORAL | Status: AC
  Administered 2021-07-12: 0.5 mg via ORAL
  Filled 2021-07-12: qty 1

## 2021-07-12 MED ORDER — ACETAMINOPHEN 650 MG RE SUPP: 650.0000 mg | Freq: Four times a day (QID) | RECTAL | Status: DC | PRN

## 2021-07-12 MED ORDER — CEFAZOLIN SODIUM-DEXTROSE 2-4 GM/100ML-% IV SOLN
INTRAVENOUS | Status: AC
  Filled 2021-07-12: qty 100

## 2021-07-12 MED ORDER — CEFAZOLIN SODIUM-DEXTROSE 2-4 GM/100ML-% IV SOLN
2.0000 g | INTRAVENOUS | Status: AC
  Administered 2021-07-12: 2 g via INTRAVENOUS

## 2021-07-12 MED ORDER — SODIUM CHLORIDE 0.9 % IV SOLN
250.0000 mL | INTRAVENOUS | Status: DC
Start: 2021-07-12 — End: 2021-07-12

## 2021-07-12 MED ORDER — SODIUM CHLORIDE 0.9% FLUSH: 3.0000 mL | INTRAVENOUS | Status: DC | PRN

## 2021-07-12 MED ORDER — LIDOCAINE HCL (PF) 1 % IJ SOLN
INTRAMUSCULAR | Status: DC | PRN
  Administered 2021-07-12: 60 mL

## 2021-07-12 SURGICAL SUPPLY — 15 items
CABLE SURGICAL S-101-97-12 (CABLE) ×2 IMPLANT
KIT ACCESSORY SELECTRA FIX CVD (MISCELLANEOUS) ×1 IMPLANT
LEAD SELECTRA 3D-55-42 (CATHETERS) ×1 IMPLANT
LEAD SOLIA S PRO MRI 53 (Lead) ×1 IMPLANT
LEAD SOLIA S PRO MRI 60 (Lead) ×1 IMPLANT
MAT PREVALON FULL STRYKER (MISCELLANEOUS) ×1 IMPLANT
PACEMAKER EDORA 8DR-T MRI (Pacemaker) ×1 IMPLANT
PAD PRO RADIOLUCENT 2001M-C (PAD) ×2 IMPLANT
POUCH AIGIS-R ANTIBACT PPM (Mesh General) ×2 IMPLANT
POUCH AIGIS-R ANTIBACT PPM MED (Mesh General) IMPLANT
SHEATH 7FR PRELUDE SNAP 13 (SHEATH) ×1 IMPLANT
SHEATH 9FR PRELUDE SNAP 13 (SHEATH) ×1 IMPLANT
SHEATH PROBE COVER 6X72 (BAG) ×1 IMPLANT
TRAY PACEMAKER INSERTION (PACKS) ×2 IMPLANT
WIRE HI TORQ VERSACORE-J 145CM (WIRE) ×1 IMPLANT

## 2021-07-12 NOTE — Progress Notes (Signed)
  Echocardiogram 2D Echocardiogram has been performed.  Denise Parrish F 07/12/2021, 11:37 AM

## 2021-07-12 NOTE — Telephone Encounter (Signed)
Spoke to daughter, [name] (listed on DPR).  Gave her test results.  She informed me that pt is at [facility] having pace maker placed today.  Had episode last night where she went 6 seconds without heart beat and her pulse was in the 20's.  Routing to [employer], PA-C as Juluis Rainier.

## 2021-07-12 NOTE — Telephone Encounter (Signed)
Returned call but had to leave a voice mail.

## 2021-07-12 NOTE — Telephone Encounter (Signed)
Noted  

## 2021-07-12 NOTE — Discharge Instructions (Addendum)
    Supplemental Discharge Instructions for  Pacemaker Patients   Activity No heavy lifting or vigorous activity with your leftarm for 6 to 8 weeks.  Do not raise your left arm above your head for one week.  Gradually raise your affected arm as drawn below.             07/17/21                 07/18/21                    07/19/21                   07/20/21 __  NO DRIVING for  1 week   ; you may begin driving on    90/3/83 .  WOUND CARE Keep the wound area clean and dry.  Do not get this area wet , no showers for one week; you may shower on  07/20/21   . The tape/steri-strips on your wound will fall off; do not pull them off.  No bandage is needed on the site.  DO  NOT apply any creams, oils, or ointments to the wound area. If you notice any drainage or discharge from the wound, any swelling or bruising at the site, or you develop a fever > 101? F after you are discharged home, call the office at once.  Special Instructions You are still able to use cellular telephones; use the ear opposite the side where you have your pacemaker/defibrillator.  Avoid carrying your cellular phone near your device. When traveling through airports, show security personnel your identification card to avoid being screened in the metal detectors.  Ask the security personnel to use the hand wand. Avoid arc welding equipment, MRI testing (magnetic resonance imaging), TENS units (transcutaneous nerve stimulators).  Call the office for questions about other devices. Avoid electrical appliances that are in poor condition or are not properly grounded. Microwave ovens are safe to be near or to operate.

## 2021-07-12 NOTE — Consult Note (Signed)
Cardiology Consultation:   Patient ID: KRISTIE BRACEWELL MRN: [id number]; DOB: [birth year]  Admit date: 07/11/2021 Date of Consult: 07/12/2021  PCP:  [name], MD   [facility] Providers Cardiologist:  Jenkins Rouge, MD   Structural heart: Dr. Burt Knack   Patient Profile:   Denise Parrish is a 87 y.o. female with a hx of CAD (PCI LCx Dec 2021), VHD (s/p TAVR Jan 7741 complicated by RLE ischemic requiring vascular intervention/vascular repair), stroke/TIA (s/p R CEA), DM, HTN, anxiety, recurrent GIB, mild dementia who is being seen 07/12/2021 for the evaluation of CHB at the request of Dr. Clayton Bibles.  History of Present Illness:   Ms. Bollier was in her USOH, which at baseline if despite her advanced age, mostlly independent in her ADLs yesterday developed sudden onset of a numb sensation to her face associated with weakness and near syncope the numb/tingling sensation seemed to spread to the other side of her face feeling worse, then resolved.  Initially not thinking much of it though started to happen a couple more times.  By the end of thday they seemed to get stronger and more frightening and EMS was called.  In review of record with no actual tracings or record for review apparently they found her HR 20's and she was hypotensive, atropine given without response and transcutaneously paced in route.  Reportedly in a 2nd degree II block  On arrival to the ER she was in SR, BP stable. Observed though to have periods of CHB and admitted by cardiology for further evaluation and EP consult for possible PPM Her home lopressor (12/1m BID) held.  LABSK+ 4.6 Mag 2.1 BUN/Creat 20/1.19 HS Trop 11 BNP 200 WBC 6.2 H/H 11/37 Plts 196 TSH 4.634 T4 0/85   The patient denies any kind of CP, no full syncope, but a few of her weak spells were near syncopal and at least one occurred while resting supine No palpitations No symptoms in the ER  Past Medical History:  Diagnosis  Date   Anxiety    Carotid stenosis, right 10/16/2019   75% on CTA neck   Cholelithiasis    on u/s 09/2011   Degenerative joint disease    Right THA in 1995; cervical discectomy and fusion-2001   Diabetes mellitus, type 2 (HCC)    + neuropathy   Emphysema    Gastroesophageal reflux disease    Hiatal hernia    History of CVA (cerebrovascular accident)    Hyperlipidemia    Hypertension    IDA (iron deficiency anemia)    labs 09/2011   Mild cognitive impairment    Obesity    Osteoporosis    Pneumonia    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 RoOk Edwardsiverticulosis, negative microscopic  colitis    COLONOSCOPY WITH PROPOFOL N/A 11/27/2020   Surgeon: CaHarvel QualeMD;  diverticulosis, no active bleeding.  Two 2-4 mm polyps not  removed in the setting of Plavix.   CORONARY STENT INTERVENTION N/A 09/08/2020   Procedure: CORONARY STENT INTERVENTION;  Surgeon: Sherren Mocha, MD;  Location: [facility];  Service: Cardiovascular;  Laterality: N/A;   ENDARTERECTOMY Right 10/21/2019   Procedure: ENDARTERECTOMY CAROTID RIGHT;  Surgeon: Waynetta Sandy, MD;  Location: [facility];  Service: Vascular;  Laterality: Right;   ESOPHAGOGASTRODUODENOSCOPY  04/03/12   Dr. Jennet Maduro hernia, chronic gastritis on bx   ESOPHAGOGASTRODUODENOSCOPY (EGD) WITH PROPOFOL N/A 04/28/2020   Procedure: ESOPHAGOGASTRODUODENOSCOPY (EGD) WITH PROPOFOL;  Surgeon: Otis Brace, MD;  Location:  [facility];  Service: Gastroenterology;  Laterality: N/A;   ESOPHAGOGASTRODUODENOSCOPY (EGD) WITH PROPOFOL N/A 11/27/2020   Surgeon: Montez Morita, Quillian Quince, MD; findings suggestive of short segment Barrett's, normal stomach, and small bowel.   FEMORAL ARTERY EXPLORATION Right 09/28/2020   Procedure: RIGHT GROIN EXPLORATION WITH REPAIR RIGHT COMMON FEMORAL ARTERY;  Surgeon: [name], MD;  [facility]: [facility];  Service: Vascular;  Laterality: Right;   GIVENS CAPSULE STUDY  05/09/2012   RMR: an unclear raised area of small bowel was noted, with features almost  characteristic of very small polyp. This was without villous  blunting or any evidence of active bleeding; yet, the area of  concern appeared to be erythematous. However, this could simply  be a light reflection on a normal variation of the small bowel. REFERRED TO DR. GILLIAM, APPT FOR NOVEMBER 2013.    GIVENS CAPSULE STUDY N/A 04/30/2020   Procedure: GIVENS CAPSULE STUDY;  Surgeon: Otis Brace, MD;  Location: [facility];  Service: Gastroenterology;  Laterality: N/A;   [facility] STUDY  11/27/2020    Surgeon: Montez Morita, Quillian Quince, MD;  questionable nodule/small mass in the distal small bowel but without ulceration.  Evidence of hematochezia throughout the colon but no fresh blood.    HIP ARTHROPLASTY Right    PATCH ANGIOPLASTY Right 10/21/2019   Procedure: PATCH ANGIOPLASTY USING Rueben Bash BIOLOGIC PATCH;  Surgeon: Waynetta Sandy, MD;  Location: [facility];  Service: Vascular;  Laterality: Right;   POLYPECTOMY  04/28/2020   Procedure: POLYPECTOMY;  Surgeon: Otis Brace, MD;  Location: [facility];  Service: Gastroenterology;;   RIGHT/LEFT HEART CATH AND CORONARY ANGIOGRAPHY N/A 09/08/2020   Procedure: RIGHT/LEFT HEART CATH AND CORONARY ANGIOGRAPHY;  Surgeon: Sherren Mocha, MD;  Location: [facility];  Service: Cardiovascular;  Laterality: N/A;   TEE WITHOUT CARDIOVERSION N/A 09/28/2020   Procedure:  TRANSESOPHAGEAL ECHOCARDIOGRAM (TEE);  Surgeon: Sherren Mocha, MD;  Location: [facility];  Service: Open Heart Surgery;  Laterality: N/A;   TONSILLECTOMY     TOTAL HIP ARTHROPLASTY  1995   Right   TRANSCATHETER AORTIC VALVE REPLACEMENT, TRANSFEMORAL N/A 09/28/2020   Procedure: TRANSCATHETER AORTIC VALVE REPLACEMENT, TRANSFEMORAL;  Surgeon: Sherren Mocha, MD;  Location: [facility];  Service: Open Heart Surgery;  Laterality: N/A;     Home Medications:  Prior to Admission medications   Medication Sig Start Date End Date Taking? Authorizing Provider  ALPRAZolam Duanne Moron) 0.5 MG tablet Take 0.5 mg by mouth at bedtime as needed for anxiety.    [provider]  Ascorbic Acid (VITAMIN C PO) Take 1 tablet by mouth daily.    [provider]  atorvastatin (LIPITOR) 40 MG tablet Take 1 tablet (40 mg total) by mouth daily at 6 PM. Patient not taking: Reported on 06/08/2021 10/22/19   Georgette Shell, MD  cholecalciferol (VITAMIN D) 25 MCG (1000 UNIT) tablet Take 1,000 Units by mouth daily.    [provider]  clopidogrel (PLAVIX) 75 MG tablet Take 1 tablet (75 mg total) by mouth daily with breakfast. 10/06/20   Eileen Stanford, PA-C  ferrous sulfate 325 (65 FE) MG tablet Take 1 tablet (325 mg total) by mouth daily. Patient not taking: Reported on 06/08/2021 01/20/21 01/20/22  Erenest Rasher, PA-C  glimepiride (AMARYL) 1 MG tablet Take 1 mg by mouth daily with breakfast.    [provider]  Glycerin-Hypromellose-PEG 400 (VISINE DRY EYE OP) Apply 1 drop to eye 3 (three) times daily as needed (for dry eye relief).     [provider]  LUTEIN PO Take 1 capsule by mouth daily.    [provider]  meclizine (ANTIVERT) 12.5 MG tablet Take 1 tablet (12.5 mg total) by mouth 3 (three) times daily as needed for dizziness. 12/14/19   Virgel Manifold, MD  metoprolol tartrate (LOPRESSOR) 25 MG tablet Take 0.5 tablets (12.5 mg total) by mouth 2 (two) times daily. 05/07/20    Mikhail, Velta Addison, DO  Multiple Vitamins-Minerals (PRESERVISION AREDS 2 PO) Take 1 tablet by mouth 2 (two) times daily. Takes 1 tablet in the morning and 1 tablet at night for eyes.    [provider]  ondansetron (ZOFRAN) 4 MG tablet Take 4 mg by mouth every 6 (six) hours as needed. 04/18/21   [provider]  pantoprazole (PROTONIX) 40 MG tablet Take 1 tablet (40 mg total) by mouth daily. 09/09/20   Sherren Mocha, MD    Inpatient Medications: Scheduled Meds:  atorvastatin  40 mg Oral q1800   sodium chloride flush  3 mL Intravenous Q12H   Continuous Infusions:  PRN Meds: acetaminophen **OR** acetaminophen  Allergies:   No Known Allergies  Social History:   Social History   Socioeconomic History   Marital status: Divorced    Spouse name: Not on file   Number of children: 4   Years of education: 6th grade   Highest education level: Not on file  Occupational History   Occupation: Retired    Comment: [employer]  Tobacco Use   Smoking status: Never   Smokeless tobacco: Never   Tobacco comments:    Quit x 50 years  Vaping Use   Vaping Use: Never used  Substance and Sexual Activity   Alcohol use: Never   Drug use: Never   Sexual activity: Not Currently  Other Topics Concern   Not on file  Social History Narrative   Lives alone. Every other week she has family with her. Daughter and son take care of her during the days.    Right-handed.   No daily use of caffeine.             Social Determinants of Health   Financial Resource Strain: Not on file  Food Insecurity: Not on file  Transportation Needs: Not on file  Physical Activity: Not on file  Stress: Not on file  Social Connections: Not on file  Intimate Partner Violence: Not on file    Family History:   Family History  Problem Relation Age of Onset   Cirrhosis Mother        no etoh use   CAD Father    Diabetes Mother        + brother   Heart failure Father    Hypertension Father        +  brother   Cancer Brother        lung, age 31   Uterine cancer Sister    Colon  cancer Neg Hx      ROS:  Please see the history of present illness.  All other ROS reviewed and negative.     Physical Exam/Data:   Vitals:   07/12/21 0600 07/12/21 0700 07/12/21 0715 07/12/21 0745  BP: 126/60 131/67    Pulse: 73 69 66 70  Resp: 19 17 17 16   Temp:      TempSrc:      SpO2: 99% 99% 95% 97%   No intake or output data in the 24 hours ending 07/12/21 0800 Last 3 Weights 06/08/2021 05/22/2021 04/28/2021  Weight (lbs) 163 lb 6.4 oz 165 lb 8 oz 166 lb 12.8 oz  Weight (kg) 74.118 kg 75.07 kg 75.66 kg     There is no height or weight on file to calculate BMI.  General:  Well nourished, well developed, in no acute distress HEENT: normal Neck: no JVD Vascular: No carotid bruits; Distal pulses 2+ bilaterally Cardiac:  RRR; no murmurs, gallops or rubs Lungs:  CTA b/l, no wheezing, rhonchi or rales  Abd: soft, nontender  Ext: no edema Musculoskeletal:  No deformities Skin: warm and dry  Neuro:  no focal abnormalities noted Psych:  Normal affect   EKG:  The EKG was personally reviewed and demonstrates:    SR 70bpm, 1st degree AVBlock 243m, narrow QRS  Telemetry:  Telemetry was personally reviewed and demonstrates:   SR 70's, intermittent CHB with pausing, longest 5.6seconds  Relevant CV Studies:  10/25/2020: TTE IMPRESSIONS   1. Left ventricular ejection fraction, by estimation, is 70 to 75%. The  left ventricle has hyperdynamic function. The left ventricle has no  regional wall motion abnormalities. There is mild left ventricular  hypertrophy. Left ventricular diastolic  parameters are consistent with Grade I diastolic dysfunction (impaired  relaxation). Elevated left atrial pressure.   2. Right ventricular systolic function is normal. The right ventricular  size is normal. There is mildly elevated pulmonary artery systolic  pressure.   3. Left atrial size was mildly dilated.   4.  Right atrial size was mildly dilated.   5. The mitral valve is abnormal. No evidence of mitral valve  regurgitation. Mild mitral stenosis.   6. Medtronic Evolut-PRO+ size 23 mm valve is in the AV position. The  prosthetic valve has normal function. . The aortic valve has been  repaired/replaced. Aortic valve regurgitation is not visualized. No aortic  stenosis is present.   7. The inferior vena cava is normal in size with greater than 50%  respiratory variability, suggesting right atrial pressure of 3 mmHg.    09/08/2020: LHC, PCI 1.  Severe 90% left circumflex stenosis (large caliber vessel) with successful PCI using a 3.5 x 12 mm resolute Onyx DES 2.  Moderate to severe distal RCA stenosis of 60 to 70%, recommend medical therapy 3.  Mild nonobstructive LAD stenosis 4.  Severe aortic stenosis with mean gradient 57 mmHg and aortic valve area 0.78 cm   Recommendations: Dual antiplatelet therapy with aspirin and clopidogrel x6 months.  Continue Cangrelor infusion x2 hours then loaded with clopidogrel 600 mg x 1.  Overnight observation in this elderly patient with severe aortic stenosis post PCI.  Continue outpatient TAVR evaluation   Laboratory Data:  High Sensitivity Troponin:   Recent Labs  Lab 07/11/21 2338  TROPONINIHS 11     Chemistry Recent Labs  Lab 07/11/21 2338 07/12/21 0450  NA 138 138  K 4.6 4.1  CL 104 105  CO2 24 28  GLUCOSE 123* 112*  BUN 24* 20  CREATININE 1.19* 1.19*  CALCIUM 8.9 8.9  MG 2.1  --   GFRNONAA 42* 42*  ANIONGAP 10 5    Recent Labs  Lab 07/12/21 0450  PROT 5.6*  ALBUMIN 3.0*  AST 25  ALT 15  ALKPHOS 49  BILITOT 0.8   Lipids No results for input(s): CHOL, TRIG, HDL, LABVLDL, LDLCALC, CHOLHDL in the last 168 hours.  Hematology Recent Labs  Lab 07/08/21 1551 07/11/21 2338  WBC 7.1 6.2  RBC 3.83 3.81*  HGB 11.5* 11.4*  HCT 35.6 37.2  MCV 93.0 97.6  MCH 30.0 29.9  MCHC 32.3 30.6  RDW 15.7* 16.8*  PLT 233 196   Thyroid   Recent Labs  Lab 07/12/21 0450  TSH 4.634*  FREET4 0.85    BNP Recent Labs  Lab 07/12/21 0450  BNP 200.2*    DDimer No results for input(s): DDIMER in the last 168 hours.   Radiology/Studies:  CT Head Wo Contrast  Result Date: 07/11/2021 CLINICAL DATA:  Head pain tingling to forehead and or EXAM: CT HEAD WITHOUT CONTRAST TECHNIQUE: Contiguous axial images were obtained from the base of the skull through the vertex without intravenous contrast. COMPARISON:  CT brain 04/07/2020, MRI 04/07/2020 FINDINGS: Brain: No acute territorial infarction, hemorrhage, or intracranial mass. Atrophy and chronic small vessel ischemic changes of the white matter. Remote right frontal and left cerebellar infarcts. Stable ventricle size. Vascular: No hyperdense vessels.  Carotid vascular calcification. Skull: Normal. Negative for fracture or focal lesion. Sinuses/Orbits: Debris in the sinuses. Other: Incomplete fusion posterior arch of C1 IMPRESSION: 1. No CT evidence for acute intracranial abnormality. 2. Atrophy and chronic small vessel ischemic changes of the white matter. Stable small chronic infarcts in the right posterior frontal lobe and left cerebellum Electronically Signed   By: Donavan Foil M.D.   On: 07/11/2021 23:36   DG Chest Portable 1 View  Result Date: 07/11/2021 CLINICAL DATA:  Bradycardia and palpitations. EXAM: PORTABLE CHEST 1 VIEW COMPARISON:  Chest x-ray 05/22/2021. FINDINGS: The heart size and mediastinal contours are within normal limits. Both lungs are clear. Cervical spinal fusion plate is visualized. No acute fractures are seen. IMPRESSION: No active disease. Electronically Signed   By: Ronney Asters M.D.   On: 07/11/2021 23:14     Assessment and Plan:   Recurrent near syncope CHB  Very small dose of lopressor at home and not felt to cause degree of CHB  She will need PPM  Updated echo is in progress Dr. Quentin Ore has seen and examined the patient discussed PPM recommendation  and rational Discussed the procedure, potential risks and benefits, she is agreeable to proceed I have spoken to [place] her daughter as well ata the patient's request  3. CAD No anginal symptoms  4. VHD Hx TVAR, no murmur on exam   Risk Assessment/Risk Scores:    For questions or updates, please contact [facility] Please consult [email or web address] for contact info under    Signed, Baldwin Jamaica, PA-C  07/12/2021 8:00 AM

## 2021-07-12 NOTE — Telephone Encounter (Signed)
Pt's daughter returning call. [phone]

## 2021-07-12 NOTE — Discharge Summary (Signed)
ELECTROPHYSIOLOGY PROCEDURE DISCHARGE SUMMARY    Patient ID: Denise Parrish,  MRN: [id number], DOB/AGE: 10/16/1922 87 y.o.  Admit date: 07/11/2021 Discharge date: 07/12/21  Primary Care Physician: [name], MD  Primary Cardiologist: Dr. Johnsie Cancel Electrophysiologist: new, Dr. Quentin Ore  Primary Discharge Diagnosis:  Recurrent near syncope Intermittent CHB with pauses  Secondary Discharge Diagnosis:  CAD PCI Dec 2021 VHD TAVR Jan 2022 Old stroke/TIA DM HTN GIB Recurrent by hx  No Known Allergies   Procedures This Admission:  1.  Implantation of a Biotronik dual chamber PPM on 07/12/21 by Dr Quentin Ore.  The patient received  Edora 8 DR-T, serial [id number],   Faylene Million 60, serial [id number]),  Solia S 53, serial [id number] (RA) There were no immediate post procedure complications. CXR on 07/13/21 demonstrated no pneumothorax status post device implantation.   Brief HPI: Denise Parrish is a 87 y.o. female was [facility] to [facility] with recurrent weak spells, near syncope, observed to have intermittent CHB with pauses.  Hospital Course:  The patient was in her USOH when day of admission she developed recurrent weak spells/near syncope, eventually EMS was called, with reports of HR 20's unresponsive to atropine and externally paced in route.  In the ER she had SR with intermittent CHB.  Cardiology called, she was on a very small dose of lopressor at home, this was held.  Labs largely unremarkable, no anginal c/o HS trop negative.   EP consulted her tiny lopressor dose not felt to cause CHB and recommended PPM.  TTE noted preserved LVEF, mod MS, TAVR functioning well.  She underwent implantation of a PPM with details as outlined above.  She was monitored on telemetry overnight which demonstrated SR, borderline 1st degree AVblock.  Left chest was without hematoma or ecchymosis.  The device was interrogated and found to be functioning normally.  CXR was obtained and  demonstrated no pneumothorax status post device implantation.  Wound care, arm mobility, and restrictions were reviewed with the patient.  The patient feels well, denies any CP/SOB, with minimal site discomfort.   She was examined by Dr. Quentin Ore and considered stable for discharge to home.   Hold Plavix 5 days Resume home lopressor   Physical Exam: Vitals:   07/12/21 2325 07/13/21 0255 07/13/21 0644 07/13/21 0752  BP: (!) 133/57 (!) 143/55  (!) 142/84  Pulse: 80 78    Resp: 17 20  16   Temp: 98.1 F (36.7 C) 98.4 F (36.9 C)  98.1 F (36.7 C)  TempSrc: Oral Oral    SpO2: 94% 94%    Weight:   75.3 kg   Height:        GEN- The patient is well appearing, alert and oriented x 3 today.   HEENT: normocephalic, atraumatic; sclera clear, conjunctiva pink; hearing intact; oropharynx clear; neck supple, no JVP Lungs- CTA b/l, normal work of breathing.  No wheezes, rales, rhonchi Heart- RRR, no murmurs, rubs or gallops, PMI not laterally displaced GI- soft, non-tender, non-distended Extremities- no clubbing, cyanosis, or edema MS- no significant deformity or atrophy Skin- warm and dry, no rash or lesion, left chest without hematoma/ecchymosis Psych- euthymic mood, full affect Neuro- no gross deficits   Labs:   Lab Results  Component Value Date   WBC 6.2 07/11/2021   HGB 11.4 (L) 07/11/2021   HCT 37.2 07/11/2021   MCV 97.6 07/11/2021   PLT 196 07/11/2021    Recent Labs  Lab 07/12/21 0450  NA 138  K 4.1  CL  105  CO2 28  BUN 20  CREATININE 1.19*  CALCIUM 8.9  PROT 5.6*  BILITOT 0.8  ALKPHOS 49  ALT 15  AST 25  GLUCOSE 112*    Discharge Medications:  Allergies as of 07/13/2021   No Known Allergies      Medication List     TAKE these medications    acetaminophen 500 MG tablet Commonly known as: TYLENOL Take 500 mg by mouth every 8 (eight) hours as needed for mild pain.   ALPRAZolam 0.5 MG tablet Commonly known as: XANAX Take 0.5 mg by mouth at bedtime.    atorvastatin 40 MG tablet Commonly known as: LIPITOR Take 1 tablet (40 mg total) by mouth daily at 6 PM.   clopidogrel 75 MG tablet Commonly known as: PLAVIX Take 1 tablet (75 mg total) by mouth daily with breakfast. Notes to patient: Do not resume until 07/18/21   ferrous sulfate 325 (65 FE) MG tablet Take 1 tablet (325 mg total) by mouth daily.   glimepiride 1 MG tablet Commonly known as: AMARYL Take 1 mg by mouth daily with breakfast.   LUTEIN PO Take 1 capsule by mouth daily.   meclizine 12.5 MG tablet Commonly known as: ANTIVERT Take 1 tablet (12.5 mg total) by mouth 3 (three) times daily as needed for dizziness.   metoprolol tartrate 25 MG tablet Commonly known as: LOPRESSOR Take 0.5 tablets (12.5 mg total) by mouth 2 (two) times daily.   pantoprazole 40 MG tablet Commonly known as: PROTONIX Take 1 tablet (40 mg total) by mouth daily.   PRESERVISION AREDS 2 PO Take 1 tablet by mouth daily. Takes 1 tablet in the morning and 1 tablet at night for eyes.   VISINE DRY EYE OP Place 1 drop into both eyes daily as needed (for dry eye relief).   vitamin C 500 MG tablet Commonly known as: ASCORBIC ACID Take 500 mg by mouth daily.        Disposition: Home Discharge Instructions     Diet - low sodium heart healthy   Complete by: As directed    Increase activity slowly   Complete by: As directed        Follow-up Information     [facility].   Specialty: Cardiology Why: 07/28/21 @ 2:00PM, wound check visit Contact information: [address], [address] [place]        Vickie Epley, MD Follow up.   Specialties: Cardiology, Radiology Why: 10/14/20 @ 1:45PM Contact information: [place] [address] [phone]                 Duration of Discharge Encounter: Greater than 30 minutes including physician time.  Venetia Night,  PA-C 07/13/2021 10:08 AM

## 2021-07-12 NOTE — H&P (Signed)
Cardiology Admission History and Physical:   Patient ID: HARMONEE TOZER MRN: [id number]; DOB: 04-13-1923   Admission date: 07/11/2021  PCP:  [name], MD   [facility] Providers Cardiologist:  Jenkins Rouge, MD   {  Chief Complaint:  "My head was tingling and I thought I was going to pass out"  Patient Profile:   Lerlene P Brian is a 87 y.o. female with hx of CAD s/p PCI to Lcx (08/2020), severe AS s/p TAVR (Evolut Pro-Plus 23 mm), carotid stenosis s/p R CEA, type 2 diabetes mellitus, essential hypertension, and anxiety who is being seen 07/12/2021 for the evaluation of suspected high grade AV block.  History of Present Illness:   Ms. Sharma has an extensive cardiac history as noted above.  Most recently she was admitted in January and underwent TAVR.  Her procedure was complicated by acute right leg ischemia, and she underwent successful right femoral repair.  Other than having COVID in September, Ms. Rayson has been doing quite well recently.  She lives alone and is able to do some things around the house.  Her daughter lives close to her and regularly checks in on her.  Just the other day they were up at the [place] visiting her son's grave.  While there she was able to walk short distances with no dizziness, lightheadedness, shortness of breath, or chest pain.  This evening she was sitting in her bed and suddenly felt a "tingling" on the right side of her face that radiated over to the left side of her face.  During this episode, she felt as though she was going to pass out.  Her symptoms briefly abated, but she then had to similar episodes of tingling that radiated from her face down to her chest.  She never passed out or lost consciousness.  Given these episodes continue to occur, Ms. Fiorenza' daughter called EMS.  Per report, when they arrived at her home she was bradycardic to the 20s and hypotensive to the 80s.  They attempted to administer atropine twice with  no improvement in her heart rate.  They believe she was in second-degree heart block (type II) and they began externally pacing at a rate of 60 bpm.  At some point shortly after arrival to the emergency department here external pacing was stopped.  Ms. Olivar has maintained a sinus rate in the 70s to 80s since.  She has not had any recurrence of her symptoms since arriving to the emergency department.  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   s/p TAVR with a 73m Medtronic Evolut Pro+ via the TF approach    Severe aortic stenosis    Sigmoid diverticulosis    Small bowel lesion    On Given's capsule; Dr GKathleene Hazel11/2013, no further w/u  needed    Past Surgical History:  Procedure Laterality Date   ANTERIOR CERVICAL DISCECTOMY  2001   C4-5, allograft, fixation   BREAST LUMPECTOMY     benign   CATARACT EXTRACTION W/ INTRAOCULAR LENS IMPLANT  2007   Left   COLONOSCOPY  2006   COLONOSCOPY  04/03/12   Dr. Ok Edwards diverticulosis, negative microscopic  colitis    COLONOSCOPY WITH PROPOFOL N/A 11/27/2020   Surgeon: Harvel Quale, MD;  diverticulosis, no active bleeding.  Two 2-4 mm polyps not removed in the setting of Plavix.   CORONARY STENT INTERVENTION N/A 09/08/2020   Procedure: CORONARY STENT INTERVENTION;  Surgeon: Sherren Mocha, MD;  Location: [place] [facility];  Service: Cardiovascular;   Laterality: N/A;   ENDARTERECTOMY Right 10/21/2019   Procedure: ENDARTERECTOMY CAROTID RIGHT;  Surgeon: Waynetta Sandy, MD;  Location: [place];  Service: Vascular;  Laterality: Right;   ESOPHAGOGASTRODUODENOSCOPY  04/03/12   Dr. Jennet Maduro hernia, chronic gastritis on bx   ESOPHAGOGASTRODUODENOSCOPY (EGD) WITH PROPOFOL N/A 04/28/2020   Procedure: ESOPHAGOGASTRODUODENOSCOPY (EGD) WITH PROPOFOL;  Surgeon: Otis Brace, MD;  Location: [place];  Service: Gastroenterology;  Laterality: N/A;   ESOPHAGOGASTRODUODENOSCOPY (EGD) WITH PROPOFOL N/A 11/27/2020   Surgeon: Montez Morita, Quillian Quince, MD; findings suggestive of short segment Barrett's, normal stomach, and small bowel.   FEMORAL ARTERY EXPLORATION Right 09/28/2020   Procedure: RIGHT GROIN EXPLORATION WITH REPAIR RIGHT COMMON FEMORAL ARTERY;  Surgeon: [name], MD;  Location: [facility];  Service: Vascular;  Laterality: Right;   GIVENS CAPSULE STUDY  05/09/2012   RMR: an unclear raised area of small bowel was noted, with features almost  characteristic of very small polyp. This was without villous  blunting or any evidence of active bleeding; yet, the area of  concern appeared to be erythematous. However, this could simply  be a light reflection on a normal variation of the small bowel. REFERRED TO DR. GILLIAM, APPT FOR NOVEMBER 2013.    GIVENS CAPSULE STUDY N/A 04/30/2020   Procedure: GIVENS CAPSULE STUDY;  Surgeon: Otis Brace, MD;  Location: [place];  Service: Gastroenterology;  Laterality: N/A;   [place] STUDY  11/27/2020    Surgeon: Montez Morita, Quillian Quince, MD;  questionable nodule/small mass in the distal small bowel but without ulceration.  Evidence of hematochezia throughout the colon but no fresh blood.    HIP ARTHROPLASTY Right    PATCH ANGIOPLASTY Right 10/21/2019   Procedure: PATCH ANGIOPLASTY USING Rueben Bash BIOLOGIC PATCH;  Surgeon: Waynetta Sandy, MD;  Location: [place];  Service: Vascular;   Laterality: Right;   POLYPECTOMY  04/28/2020   Procedure: POLYPECTOMY;  Surgeon: Otis Brace, MD;  Location: [facility];  Service: Gastroenterology;;   RIGHT/LEFT HEART CATH AND CORONARY ANGIOGRAPHY N/A 09/08/2020   Procedure: RIGHT/LEFT HEART CATH AND CORONARY ANGIOGRAPHY;  Surgeon: Sherren Mocha, MD;  Location: [place] [facility];  Service: Cardiovascular;  Laterality: N/A;   TEE WITHOUT CARDIOVERSION N/A 09/28/2020   Procedure: TRANSESOPHAGEAL ECHOCARDIOGRAM (TEE);  Surgeon: Sherren Mocha, MD;  Location: [place];  Service: Open Heart Surgery;  Laterality: N/A;   TONSILLECTOMY     TOTAL HIP ARTHROPLASTY  1995   Right   TRANSCATHETER AORTIC VALVE REPLACEMENT, TRANSFEMORAL N/A 09/28/2020   Procedure: TRANSCATHETER AORTIC VALVE REPLACEMENT, TRANSFEMORAL;  Surgeon: Sherren Mocha, MD;  Location: [place];  Service: Open Heart Surgery;  Laterality: N/A;     Medications Prior to Admission: Prior to Admission medications   Medication Sig Start Date End Date Taking? Authorizing Provider  ALPRAZolam (XANAX) 0.5 MG tablet Take 0.5 mg by mouth at bedtime as needed for anxiety.    [provider]  Ascorbic Acid (VITAMIN C PO) Take 1 tablet by mouth daily.    [provider]  atorvastatin (LIPITOR) 40 MG tablet Take 1 tablet (40 mg total) by mouth daily at 6 PM. Patient not taking: Reported on 06/08/2021 10/22/19   Georgette Shell, MD  cholecalciferol (VITAMIN D) 25 MCG (1000 UNIT) tablet Take 1,000 Units by mouth daily.    [provider]  clopidogrel (PLAVIX) 75 MG tablet Take 1 tablet (75 mg total) by mouth daily with breakfast. 10/06/20   Eileen Stanford, PA-C  ferrous sulfate 325 (65 FE) MG tablet Take 1 tablet (325 mg total) by mouth daily. Patient not taking: Reported on 06/08/2021 01/20/21 01/20/22  Erenest Rasher, PA-C  glimepiride (AMARYL) 1 MG tablet Take 1 mg by mouth daily with breakfast.    [provider]  Glycerin-Hypromellose-PEG 400 (VISINE  DRY EYE OP) Apply 1 drop to eye 3 (three) times daily as needed (for dry eye relief).     [provider]  LUTEIN PO Take 1 capsule by mouth daily.    [provider]  meclizine (ANTIVERT) 12.5 MG tablet Take 1 tablet (12.5 mg total) by mouth 3 (three) times daily as needed for dizziness. 12/14/19   Virgel Manifold, MD  metoprolol tartrate (LOPRESSOR) 25 MG tablet Take 0.5 tablets (12.5 mg total) by mouth 2 (two) times daily. 05/07/20   Mikhail, Velta Addison, DO  Multiple Vitamins-Minerals (PRESERVISION AREDS 2 PO) Take 1 tablet by mouth 2 (two) times daily. Takes 1 tablet in the morning and 1 tablet at night for eyes.    [provider]  ondansetron (ZOFRAN) 4 MG tablet Take 4 mg by mouth every 6 (six) hours as needed. 04/18/21   [provider]  pantoprazole (PROTONIX) 40 MG tablet Take 1 tablet (40 mg total) by mouth daily. 09/09/20   Sherren Mocha, MD     Allergies:   No Known Allergies  Social History:   Social History   Socioeconomic History   Marital status: Divorced    Spouse name: Not on file   Number of children: 4   Years of education: 6th grade   Highest education level: Not on file  Occupational History   Occupation: Retired    Comment: [employer]  Tobacco Use   Smoking status: Never   Smokeless tobacco: Never   Tobacco comments:    Quit x 50 years  Vaping Use   Vaping Use: Never used  Substance and Sexual Activity   Alcohol use: Never   Drug use: Never   Sexual activity: Not Currently  Other Topics Concern   Not on file  Social History Narrative   Lives alone. Every other week she has family with her. Daughter and son take care of her during the days.    Right-handed.   No daily use of caffeine.             Social Determinants of Health   Financial Resource Strain: Not on file  Food Insecurity: Not on file  Transportation Needs: Not on file  Physical Activity: Not on file  Stress: Not on file  Social Connections: Not on file   Intimate Partner Violence: Not on file    Family History:   The patient's family history includes CAD in her father; Cancer in her brother; Cirrhosis in her mother; Diabetes in her mother; Heart  failure in her father; Hypertension in her father; Uterine cancer in her sister. There is no history of Colon cancer.    ROS:  Please see the history of present illness.  All other ROS reviewed and negative.     Physical Exam/Data:   Vitals:   07/11/21 2248 07/11/21 2249 07/11/21 2330 07/12/21 0030  BP:  108/69 (!) 136/92 (!) 144/71  Pulse:   74 80  Resp: 18  19 16   Temp:      TempSrc:      SpO2:   100% 99%   No intake or output data in the 24 hours ending 07/12/21 0057 Last 3 Weights 06/08/2021 05/22/2021 04/28/2021  Weight (lbs) 163 lb 6.4 oz 165 lb 8 oz 166 lb 12.8 oz  Weight (kg) 74.118 kg 75.07 kg 75.66 kg     There is no height or weight on file to calculate BMI.  General:  Well nourished, well developed, in no acute distress HEENT: normal Neck: no JVD Vascular: No carotid bruits; Distal pulses 2+ bilaterally   Cardiac:  normal S1, S2; RRR; soft early peaking systolic murmur  Lungs:  clear to auscultation bilaterally, no wheezing, rhonchi or rales  Abd: soft, nontender, no hepatomegaly  Ext: no edema Musculoskeletal:  No deformities, BUE and BLE strength normal and equal Skin: warm and dry  Neuro:  CNs 2-12 intact, no focal abnormalities noted Psych:  Normal affect   EKG:  The ECG that was done 07/11/21 was personally reviewed and demonstrates NSR, 1st degree AVB, LVH  Relevant CV Studies: LHC (08/2020): 1.  Severe 90% left circumflex stenosis (large caliber vessel) with successful PCI using a 3.5 x 12 mm resolute Onyx DES 2.  Moderate to severe distal RCA stenosis of 60 to 70%, recommend medical therapy 3.  Mild nonobstructive LAD stenosis 4.  Severe aortic stenosis with mean gradient 57 mmHg and aortic valve area 0.78 cm  TTE (10/2020):  1. Left ventricular ejection  fraction, by estimation, is 70 to 75%. The  left ventricle has hyperdynamic function. The left ventricle has no  regional wall motion abnormalities. There is mild left ventricular  hypertrophy. Left ventricular diastolic  parameters are consistent with Grade I diastolic dysfunction (impaired  relaxation). Elevated left atrial pressure.   2. Right ventricular systolic function is normal. The right ventricular  size is normal. There is mildly elevated pulmonary artery systolic  pressure.   3. Left atrial size was mildly dilated.   4. Right atrial size was mildly dilated.   5. The mitral valve is abnormal. No evidence of mitral valve  regurgitation. Mild mitral stenosis.   6. Medtronic Evolut-PRO+ size 23 mm valve is in the AV position. The  prosthetic valve has normal function. . The aortic valve has been  repaired/replaced. Aortic valve regurgitation is not visualized. No aortic  stenosis is present.   7. The inferior vena cava is normal in size with greater than 50%  respiratory variability, suggesting right atrial pressure of 3 mmHg.   Laboratory Data:  High Sensitivity Troponin:  No results for input(s): TROPONINIHS in the last 720 hours.    ChemistryNo results for input(s): NA, K, CL, CO2, GLUCOSE, BUN, CREATININE, CALCIUM, MG, GFRNONAA, GFRAA, ANIONGAP in the last 168 hours.  No results for input(s): PROT, ALBUMIN, AST, ALT, ALKPHOS, BILITOT in the last 168 hours. Lipids No results for input(s): CHOL, TRIG, HDL, LABVLDL, LDLCALC, CHOLHDL in the last 168 hours. Hematology Recent Labs  Lab 07/08/21 1551 07/11/21 2338  WBC  7.1 6.2  RBC 3.83 3.81*  HGB 11.5* 11.4*  HCT 35.6 37.2  MCV 93.0 97.6  MCH 30.0 29.9  MCHC 32.3 30.6  RDW 15.7* 16.8*  PLT 233 196   Thyroid No results for input(s): TSH, FREET4 in the last 168 hours. BNPNo results for input(s): BNP, PROBNP in the last 168 hours.  DDimer No results for input(s): DDIMER in the last 168 hours.   Radiology/Studies:  CT  Head Wo Contrast  Result Date: 07/11/2021 CLINICAL DATA:  Head pain tingling to forehead and or EXAM: CT HEAD WITHOUT CONTRAST TECHNIQUE: Contiguous axial images were obtained from the base of the skull through the vertex without intravenous contrast. COMPARISON:  CT brain 04/07/2020, MRI 04/07/2020 FINDINGS: Brain: No acute territorial infarction, hemorrhage, or intracranial mass. Atrophy and chronic small vessel ischemic changes of the white matter. Remote right frontal and left cerebellar infarcts. Stable ventricle size. Vascular: No hyperdense vessels.  Carotid vascular calcification. Skull: Normal. Negative for fracture or focal lesion. Sinuses/Orbits: Debris in the sinuses. Other: Incomplete fusion posterior arch of C1 IMPRESSION: 1. No CT evidence for acute intracranial abnormality. 2. Atrophy and chronic small vessel ischemic changes of the white matter. Stable small chronic infarcts in the right posterior frontal lobe and left cerebellum Electronically Signed   By: Donavan Foil M.D.   On: 07/11/2021 23:36   DG Chest Portable 1 View  Result Date: 07/11/2021 CLINICAL DATA:  Bradycardia and palpitations. EXAM: PORTABLE CHEST 1 VIEW COMPARISON:  Chest x-ray 05/22/2021. FINDINGS: The heart size and mediastinal contours are within normal limits. Both lungs are clear. Cervical spinal fusion plate is visualized. No acute fractures are seen. IMPRESSION: No active disease. Electronically Signed   By: Ronney Asters M.D.   On: 07/11/2021 23:14     Assessment and Plan:   #High Grade AV Block: - Developed acute onset of tingling and near syncope while sitting in bed this evening. EMS noted a HR of 20 and suspected a Mobitz type 2. This was unresponsive to atropine and she was transcutaneously paced until arrival here. She has remained in sinus with 1st degree AV block since. - Difficult to know exactly what rhythm was at the time of EMS' arrival. The strip available has a few runs of possible 3:2 2nd  degree AV block (type 2) with narrow QRS complexes. I am concerned re higher degree block however, if her original HR was truly 20 at the time of presentation  - Ms. Coulston certainly is at risk for the development of this given her age, CAD, and recent TAVR (although at baseline she does not have any evidence of infrahisian block). She is on metoprolol although at a very low dose. She does not have any symptoms of prosthetic valve endocarditis - We discussed the potential need for PPM. Ms. Kochan and her daughter would both like to proceed if indicated. Although 89, she remains relatively robust  - Monitor on tele with pads on overnight  - EP consult in the AM  - Hold metoprolol  - Check TFTs - Echo in the AM  #CAD #S/p TAVR - Cont plavix  - Cont atorva  Risk Assessment/Risk Scores:   Severity of Illness: The appropriate patient status for this patient is INPATIENT. Inpatient status is judged to be reasonable and necessary in order to provide the required intensity of service to ensure the patient's safety. The patient's presenting symptoms, physical exam findings, and initial radiographic and laboratory data in the context of their chronic comorbidities  is felt to place them at high risk for further clinical deterioration. Furthermore, it is not anticipated that the patient will be medically stable for discharge from the hospital within 2 midnights of admission.   * I certify that at the point of admission it is my clinical judgment that the patient will require inpatient hospital care spanning beyond 2 midnights from the point of admission due to high intensity of service, high risk for further deterioration and high frequency of surveillance required.*   For questions or updates, please contact [facility] Please consult [email or web address] for contact info under     Signed, Ronaldo Miyamoto, MD  07/12/2021 12:57 AM

## 2021-07-13 ENCOUNTER — Encounter (HOSPITAL_COMMUNITY): Payer: Self-pay | Admitting: Cardiology

## 2021-07-13 ENCOUNTER — Inpatient Hospital Stay (HOSPITAL_COMMUNITY): Payer: Medicare Other

## 2021-07-13 LAB — GLUCOSE, CAPILLARY: Glucose-Capillary: 104 mg/dL — ABNORMAL HIGH (ref 70–99)

## 2021-07-13 MED FILL — Heparin Sod (Porcine)-NaCl IV Soln 1000 Unit/500ML-0.9%: INTRAVENOUS | Qty: 500 | Status: AC

## 2021-07-13 NOTE — Plan of Care (Signed)
  Problem: Education: Goal: Knowledge of General Education information will improve Description: Including pain rating scale, medication(s)/side effects and non-pharmacologic comfort measures 07/13/2021 1142 by Shanon Ace, RN Outcome: Adequate for Discharge 07/13/2021 1018 by Shanon Ace, RN Outcome: Progressing   Problem: Health Behavior/Discharge Planning: Goal: Ability to manage health-related needs will improve 07/13/2021 1142 by Shanon Ace, RN Outcome: Adequate for Discharge 07/13/2021 1018 by Shanon Ace, RN Outcome: Progressing   Problem: Clinical Measurements: Goal: Ability to maintain clinical measurements within normal limits will improve 07/13/2021 1142 by Shanon Ace, RN Outcome: Adequate for Discharge 07/13/2021 1018 by Shanon Ace, RN Outcome: Progressing Goal: Will remain free from infection 07/13/2021 1142 by Shanon Ace, RN Outcome: Adequate for Discharge 07/13/2021 1018 by Shanon Ace, RN Outcome: Progressing Goal: Diagnostic test results will improve 07/13/2021 1142 by Shanon Ace, RN Outcome: Adequate for Discharge 07/13/2021 1018 by Shanon Ace, RN Outcome: Progressing Goal: Respiratory complications will improve 07/13/2021 1142 by Shanon Ace, RN Outcome: Adequate for Discharge 07/13/2021 1018 by Shanon Ace, RN Outcome: Progressing Goal: Cardiovascular complication will be avoided 07/13/2021 1142 by Shanon Ace, RN Outcome: Adequate for Discharge 07/13/2021 1018 by Shanon Ace, RN Outcome: Progressing   Problem: Activity: Goal: Risk for activity intolerance will decrease 07/13/2021 1142 by Shanon Ace, RN Outcome: Adequate for Discharge 07/13/2021 1018 by Shanon Ace, RN Outcome: Progressing   Problem: Nutrition: Goal: Adequate nutrition will be maintained 07/13/2021 1142 by Shanon Ace, RN Outcome: Adequate for Discharge 07/13/2021 1018 by Shanon Ace, RN Outcome: Progressing   Problem:  Coping: Goal: Level of anxiety will decrease 07/13/2021 1142 by Shanon Ace, RN Outcome: Adequate for Discharge 07/13/2021 1018 by Shanon Ace, RN Outcome: Progressing   Problem: Elimination: Goal: Will not experience complications related to bowel motility 07/13/2021 1142 by Shanon Ace, RN Outcome: Adequate for Discharge 07/13/2021 1018 by Shanon Ace, RN Outcome: Progressing Goal: Will not experience complications related to urinary retention 07/13/2021 1142 by Shanon Ace, RN Outcome: Adequate for Discharge 07/13/2021 1018 by Shanon Ace, RN Outcome: Progressing   Problem: Pain Managment: Goal: General experience of comfort will improve 07/13/2021 1142 by Shanon Ace, RN Outcome: Adequate for Discharge 07/13/2021 1018 by Shanon Ace, RN Outcome: Progressing   Problem: Safety: Goal: Ability to remain free from injury will improve 07/13/2021 1142 by Shanon Ace, RN Outcome: Adequate for Discharge 07/13/2021 1018 by Shanon Ace, RN Outcome: Progressing   Problem: Skin Integrity: Goal: Risk for impaired skin integrity will decrease 07/13/2021 1142 by Shanon Ace, RN Outcome: Adequate for Discharge 07/13/2021 1018 by Shanon Ace, RN Outcome: Progressing   Problem: Education: Goal: Knowledge of cardiac device and self-care will improve 07/13/2021 1142 by Shanon Ace, RN Outcome: Adequate for Discharge 07/13/2021 1018 by Shanon Ace, RN Outcome: Progressing Goal: Ability to safely manage health related needs after discharge will improve 07/13/2021 1142 by Shanon Ace, RN Outcome: Adequate for Discharge 07/13/2021 1018 by Shanon Ace, RN Outcome: Progressing Goal: Individualized Educational Video(s) 07/13/2021 1142 by Shanon Ace, RN Outcome: Adequate for Discharge 07/13/2021 1018 by Shanon Ace, RN Outcome: Progressing

## 2021-07-13 NOTE — Plan of Care (Signed)

## 2021-07-28 ENCOUNTER — Other Ambulatory Visit: Payer: Self-pay

## 2021-07-28 ENCOUNTER — Ambulatory Visit (INDEPENDENT_AMBULATORY_CARE_PROVIDER_SITE_OTHER): Payer: Medicare Other

## 2021-07-28 DIAGNOSIS — I442 Atrioventricular block, complete: Secondary | ICD-10-CM

## 2021-07-28 NOTE — Patient Instructions (Signed)

## 2021-07-29 LAB — CUP PACEART INCLINIC DEVICE CHECK
Battery Remaining Longevity: 100 mo
Brady Statistic RA Percent Paced: 19 %
Brady Statistic RV Percent Paced: 91 %
Date Time Interrogation Session: 20221110151100
Implantable Lead Implant Date: 20221026
Implantable Lead Implant Date: 20221026
Implantable Lead Location: 753859
Implantable Lead Location: 753860
Implantable Lead Model: 377171
Implantable Lead Model: 377171
Implantable Lead Serial Number: 8000595070
Implantable Lead Serial Number: 8000606642
Implantable Pulse Generator Implant Date: 20221026
Lead Channel Impedance Value: 507 Ohm
Lead Channel Impedance Value: 565 Ohm
Lead Channel Pacing Threshold Amplitude: 0.7 V
Lead Channel Pacing Threshold Amplitude: 0.7 V
Lead Channel Pacing Threshold Pulse Width: 0.4 ms
Lead Channel Pacing Threshold Pulse Width: 0.4 ms
Lead Channel Sensing Intrinsic Amplitude: 10.5 mV
Lead Channel Sensing Intrinsic Amplitude: 5.2 mV
Lead Channel Setting Pacing Amplitude: 3 V
Lead Channel Setting Pacing Amplitude: 3 V
Lead Channel Setting Pacing Pulse Width: 0.4 ms
Pulse Gen Model: 407145
Pulse Gen Serial Number: 70223175

## 2021-07-29 NOTE — Progress Notes (Signed)
Wound check appointment s/p dual chamber PPM placement 10/25 for [facility]. Steri-strips removed by patient prior to today's appointment. Wound without redness or edema. Incision edges approximated, wound well healed. Normal device function. Thresholds, sensing, and impedances consistent with implant measurements. Device programmed at 3.0V/auto capture programmed on for extra safety margin until 3 month visit. Histogram distribution appropriate for patient and level of activity. No mode switches or high ventricular rates noted. 3 brief episodes of AF since implant. Longest 3 min 38 sec. No history noted. No OAC. Patient has history of Stroke with additional silent TIAs. On plavix. History of GI bleed x 2 requiring transfusions. Discussed with Dr. Lovena Le. Will alert Dr. Quentin Ore for recommendations. Patient educated about wound care, arm mobility, lifting restrictions. Enrolled in remote monitoring with next transmission scheduled 10/12/21. 91 day FU with Dr. Quentin Ore 10/14/21

## 2021-08-01 ENCOUNTER — Other Ambulatory Visit: Payer: Self-pay | Admitting: *Deleted

## 2021-08-01 DIAGNOSIS — Z9889 Other specified postprocedural states: Secondary | ICD-10-CM

## 2021-08-04 ENCOUNTER — Ambulatory Visit (INDEPENDENT_AMBULATORY_CARE_PROVIDER_SITE_OTHER): Payer: Medicare Other | Admitting: Physician Assistant

## 2021-08-04 ENCOUNTER — Ambulatory Visit (HOSPITAL_COMMUNITY)
Admission: RE | Admit: 2021-08-04 | Discharge: 2021-08-04 | Disposition: A | Discharge status: Home / Self Care | Payer: Medicare Other | Source: Ambulatory Visit | Attending: Vascular Surgery | Admitting: Vascular Surgery

## 2021-08-04 ENCOUNTER — Other Ambulatory Visit: Payer: Self-pay

## 2021-08-04 VITALS — BP 130/80 | HR 76 | Temp 98.0°F | Resp 20 | Ht 65.0 in | Wt 164.4 lb

## 2021-08-04 DIAGNOSIS — I6521 Occlusion and stenosis of right carotid artery: Secondary | ICD-10-CM

## 2021-08-04 DIAGNOSIS — Z9889 Other specified postprocedural states: Secondary | ICD-10-CM | POA: Diagnosis not present

## 2021-08-04 NOTE — Progress Notes (Signed)
History of Present Illness:  Patient is a 87 y.o. year old female who presents for evaluation of carotid stenosis.  S/P right CEA for symptomatic stenosis 10/21/19 by Dr. Donzetta Matters.   The patient denies symptoms of TIA, amaurosis, or stroke.    She was seen 6 months ago for ABI f/u after Right leg ischemia status post TAVR via right groin.  She denise rest pain, claudication or non healing wounds.      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 ROk Edwardsdiverticulosis, negative microscopic  colitis    COLONOSCOPY WITH PROPOFOL N/A 11/27/2020   Surgeon: CHarvel Quale MD;  diverticulosis, no active bleeding.  Two 2-4 mm polyps not removed in the setting of Plavix.   CORONARY STENT INTERVENTION N/A 09/08/2020   Procedure: CORONARY STENT INTERVENTION;  Surgeon: CSherren Mocha MD;  [facility];  Service: Cardiovascular;   Laterality: N/A;   ENDARTERECTOMY Right 10/21/2019   Procedure: ENDARTERECTOMY CAROTID RIGHT;  Surgeon: CWaynetta Sandy MD;  [place]  Service: Vascular;  Laterality: Right;   ESOPHAGOGASTRODUODENOSCOPY  04/03/2012   Dr. RJennet Madurohernia, chronic gastritis on bx   ESOPHAGOGASTRODUODENOSCOPY (EGD) WITH PROPOFOL N/A 04/28/2020   Procedure: ESOPHAGOGASTRODUODENOSCOPY (EGD) WITH PROPOFOL;  Surgeon: BOtis Brace MD;  [place]  Service: Gastroenterology;  Laterality: N/A;   ESOPHAGOGASTRODUODENOSCOPY (EGD) WITH PROPOFOL N/A 11/27/2020   Surgeon: CMontez Morita DQuillian Quince MD; findings suggestive of short segment Barrett's, normal stomach, and small bowel.   FEMORAL ARTERY EXPLORATION Right 09/28/2020   Procedure: RIGHT GROIN EXPLORATION WITH REPAIR RIGHT COMMON FEMORAL ARTERY;  Surgeon: ERosetta Posner MD;  [facility];  Service: Vascular;  Laterality: Right;   GIVENS CAPSULE STUDY  05/09/2012   RMR: an unclear raised area of small bowel was noted, with features almost  characteristic of very small polyp. This was without villous  blunting or any evidence of active bleeding; yet, the area of  concern appeared to be erythematous. However, this could simply  be a light reflection on a normal variation of the small bowel. REFERRED TO DR. GILLIAM, APPT FOR NOVEMBER 2013.    GIVENS CAPSULE STUDY N/A 04/30/2020   Procedure:  GIVENS CAPSULE STUDY;  Surgeon: Otis Brace, MD;  Location: [place];  Service: Gastroenterology;  Laterality: N/A;   [place] STUDY  11/27/2020    Surgeon: Montez Morita, Quillian Quince, MD;  questionable nodule/small mass in the distal small bowel but without ulceration.  Evidence of hematochezia throughout the colon but no fresh blood.    HIP ARTHROPLASTY Right    INSERT / REPLACE / REMOVE PACEMAKER     PACEMAKER IMPLANT N/A 07/12/2021   Procedure: PACEMAKER IMPLANT;  Surgeon: Vickie Epley, MD;  Location: [place] [facility];  Service: Cardiovascular;  Laterality: N/A;   PATCH ANGIOPLASTY Right 10/21/2019   Procedure: PATCH ANGIOPLASTY USING Rueben Bash BIOLOGIC PATCH;  Surgeon: Waynetta Sandy, MD;  Location: [place];  Service: Vascular;  Laterality: Right;   POLYPECTOMY  04/28/2020   Procedure: POLYPECTOMY;  Surgeon: Otis Brace, MD;  Location: [place] [facility];  Service: Gastroenterology;;   RIGHT/LEFT HEART CATH AND CORONARY ANGIOGRAPHY N/A 09/08/2020   Procedure: RIGHT/LEFT HEART CATH AND CORONARY ANGIOGRAPHY;  Surgeon: Sherren Mocha, MD;  Location: [place] [facility];  Service: Cardiovascular;  Laterality: N/A;   TEE WITHOUT CARDIOVERSION N/A 09/28/2020   Procedure: TRANSESOPHAGEAL ECHOCARDIOGRAM (TEE);  Surgeon: Sherren Mocha, MD;  Location: [place];  Service: Open Heart Surgery;  Laterality: N/A;   TONSILLECTOMY     TOTAL HIP ARTHROPLASTY  1995   Right   TRANSCATHETER AORTIC VALVE REPLACEMENT, TRANSFEMORAL N/A 09/28/2020   Procedure: TRANSCATHETER AORTIC VALVE REPLACEMENT, TRANSFEMORAL;  Surgeon: Sherren Mocha, MD;  Location: [place];  Service: Open Heart Surgery;  Laterality: N/A;     Social History Social History   Tobacco Use   Smoking status: Never   Smokeless tobacco: Never   Tobacco comments:    Quit x 50 years  Vaping Use   Vaping Use: Never used  Substance Use Topics   Alcohol use: Never   Drug use: Never    Family History Family History  Problem Relation Age of Onset   Cirrhosis Mother        no etoh use   CAD Father    Diabetes Mother        + brother   Heart failure Father    Hypertension Father        + brother   Cancer Brother        lung, age 26   Uterine cancer Sister    Colon cancer Neg Hx     Allergies  No Known Allergies   Current Outpatient Medications  Medication Sig Dispense Refill   acetaminophen (TYLENOL) 500 MG tablet Take 500 mg by mouth every 8 (eight) hours as needed for mild pain.     ALPRAZolam (XANAX) 0.5 MG tablet Take 0.5 mg by  mouth at bedtime.     atorvastatin (LIPITOR) 40 MG tablet Take 1 tablet (40 mg total) by mouth daily at 6 PM. 30 tablet 2   clopidogrel (PLAVIX) 75 MG tablet Take 1 tablet (75 mg total) by mouth daily with breakfast. 90 tablet 1   ferrous sulfate 325 (65 FE) MG tablet Take 1 tablet (325 mg total) by mouth daily. 30 tablet 3   glimepiride (AMARYL) 1 MG tablet Take 1 mg by mouth daily with breakfast.     Glycerin-Hypromellose-PEG 400 (VISINE DRY EYE OP) Place 1 drop into both eyes daily as needed (for dry eye relief).     LUTEIN PO Take 1 capsule by mouth daily.     meclizine (ANTIVERT) 12.5 MG tablet Take 1  tablet (12.5 mg total) by mouth 3 (three) times daily as needed for dizziness. 20 tablet 0   metoprolol tartrate (LOPRESSOR) 25 MG tablet Take 0.5 tablets (12.5 mg total) by mouth 2 (two) times daily. 30 tablet 0   Multiple Vitamins-Minerals (PRESERVISION AREDS 2 PO) Take 1 tablet by mouth daily. Takes 1 tablet in the morning and 1 tablet at night for eyes.     pantoprazole (PROTONIX) 40 MG tablet Take 1 tablet (40 mg total) by mouth daily. 90 tablet 3   vitamin C (ASCORBIC ACID) 500 MG tablet Take 500 mg by mouth daily.     No current facility-administered medications for this visit.    ROS:   General:  No weight loss, Fever, chills  HEENT: No recent headaches, no nasal bleeding, no visual changes, no sore throat  Neurologic: No dizziness, blackouts, seizures. No recent symptoms of stroke or mini- stroke. No recent episodes of slurred speech, or temporary blindness.  Cardiac: No recent episodes of chest pain/pressure, no shortness of breath at rest.  No shortness of breath with exertion.  positive history of atrial fibrillation or irregular heartbeat  Vascular: No history of rest pain in feet.  No history of claudication.  No history of non-healing ulcer, No history of DVT   Pulmonary: No home oxygen, no productive cough, no hemoptysis,  No asthma or wheezing  Musculoskeletal:  [ ]   Arthritis, [ ]  Low back pain,  [ x] Joint pain  Hematologic:No history of hypercoagulable state.  No history of easy bleeding.  No history of anemia  Gastrointestinal: No hematochezia or melena,  No gastroesophageal reflux, no trouble swallowing  Urinary: [ ]  chronic Kidney disease, [ ]  on HD - [ ]  MWF or [ ]  TTHS, [ ]  Burning with urination, [ ]  Frequent urination, [ ]  Difficulty urinating;   Skin: No rashes  Psychological: No history of anxiety,  No history of depression   Physical Examination  Vitals:   08/04/21 1538  BP: (!) 146/72  Pulse: 76  Resp: 20  Temp: 98 F (36.7 C)  TempSrc: Temporal  SpO2: 97%  Weight: 164 lb 6.4 oz (74.6 kg)  Height: 5' 5"  (1.651 m)    Body mass index is 27.36 kg/m.  General:  Alert and oriented, no acute distress HEENT: Normal Neck: No bruit or JVD Pulmonary: Clear to auscultation bilaterally Cardiac: Regular Rate and Rhythm without murmur Gastrointestinal: Soft, non-tender, non-distended, no mass, no scars Skin: No rash Extremity Pulses:  2+ radial Musculoskeletal: No deformity or edema  Neurologic: Upper and lower extremity motor 5/5 and symmetric  DATA:     Right Carotid Findings:  +----------+--------+--------+--------+------------------+--------+            PSV cm/sEDV cm/sStenosisPlaque DescriptionComments  +----------+--------+--------+--------+------------------+--------+  CCA Prox  80      11                                          +----------+--------+--------+--------+------------------+--------+  CCA Mid   110     12              homogeneous                 +----------+--------+--------+--------+------------------+--------+  CCA Distal88      17                                          +----------+--------+--------+--------+------------------+--------+  ICA Prox  95      20      1-39%                               +----------+--------+--------+--------+------------------+--------+   ICA Mid   118     28                                          +----------+--------+--------+--------+------------------+--------+  ICA Distal139     30                                          +----------+--------+--------+--------+------------------+--------+  ECA       100     4               heterogenous                +----------+--------+--------+--------+------------------+--------+   +----------+--------+-------+----------------+-------------------+            PSV cm/sEDV cmsDescribe        Arm Pressure (mmHG)  +----------+--------+-------+----------------+-------------------+  Subclavian142     4      Multiphasic, WNL                     +----------+--------+-------+----------------+-------------------+   +---------+--------+--+--------+--+---------+  VertebralPSV cm/s63EDV cm/s11Antegrade  +---------+--------+--+--------+--+---------+       Left Carotid Findings:  +----------+--------+--------+--------+------------------+--------+            PSV cm/sEDV cm/sStenosisPlaque DescriptionComments  +----------+--------+--------+--------+------------------+--------+  CCA Prox  138     19                                          +----------+--------+--------+--------+------------------+--------+  CCA Mid   88      16                                          +----------+--------+--------+--------+------------------+--------+  CCA Distal74      18                                          +----------+--------+--------+--------+------------------+--------+  ICA Prox  71      14      1-39%                               +----------+--------+--------+--------+------------------+--------+  ICA Mid   86      17                                          +----------+--------+--------+--------+------------------+--------+  ICA Distal126     30                                           +----------+--------+--------+--------+------------------+--------+  ECA       107     4               heterogenous                +----------+--------+--------+--------+------------------+--------+   +----------+--------+--------+----------------+-------------------+            PSV cm/sEDV cm/sDescribe        Arm Pressure (mmHG)  +----------+--------+--------+----------------+-------------------+  Subclavian108     4       Multiphasic, WNL                     +----------+--------+--------+----------------+-------------------+   +---------+--------+--+--------+--+---------+  VertebralPSV cm/s62EDV cm/s16Antegrade  +---------+--------+--+--------+--+---------+     Summary:  Right Carotid: Velocities in the right ICA are consistent with a 1-39%  stenosis.   Left Carotid: Velocities in the left ICA are consistent with a 1-39%  stenosis.   Vertebrals:  Bilateral vertebral arteries demonstrate antegrade flow.  Subclavians: Normal flow hemodynamics were seen in bilateral subclavian               arteries.   ASSESSMENT:  S/P right CEA by Dr. Donzetta Matters 10/21/19 No re stenosis and B ICA < 39 % stenosis She is asymptomatic for stroke/TIA.   PLAN: She will f/u in 1 year for repeat carotid duplex surveillance.   If she has symptoms of stroke/TIA they will call 911.   Roxy Horseman PA-C [facility] Office: [phone]  MD in office [place]

## 2021-08-09 ENCOUNTER — Encounter: Payer: Medicare Other | Admitting: Cardiology

## 2021-08-09 NOTE — Progress Notes (Deleted)
Electrophysiology Office Follow up Visit Note:    Date:  08/09/2021   ID:  Denise Parrish, Denise Parrish [birth year], MRN [id number]  PCP:  [name], MD  [facility] Cardiologist:  Jenkins Rouge, MD  [place] Electrophysiologist:  None    Interval History:    Denise Parrish is a 87 y.o. female who presents for a follow up visit.  She had a successful pacemaker implant on July 12, 2021.  On remote monitoring since implant there have been 3 episodes of atrial fibrillation with the longest lasting between 3 and 4 minutes.  She has a history of TIAs and stroke.  She also has a history of GI bleed requiring transfusions.  She presents today to discuss her atrial fibrillation and appropriate stroke protection.       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 ROk Edwardsdiverticulosis, negative microscopic  colitis    COLONOSCOPY WITH  PROPOFOL N/A 11/27/2020   Surgeon: CHarvel Quale MD;  diverticulosis, no active bleeding.  Two 2-4 mm polyps not removed in the setting of Plavix.   CORONARY STENT INTERVENTION N/A 09/08/2020   Procedure: CORONARY STENT INTERVENTION;  Surgeon: CSherren Mocha MD;  [facility];  Service: Cardiovascular;  Laterality: N/A;   ENDARTERECTOMY Right 10/21/2019   Procedure: ENDARTERECTOMY CAROTID RIGHT;  Surgeon: CWaynetta Sandy MD;  [place]  Service: Vascular;  Laterality: Right;   ESOPHAGOGASTRODUODENOSCOPY  04/03/2012   Dr. RJennet Madurohernia, chronic gastritis on bx   ESOPHAGOGASTRODUODENOSCOPY (EGD) WITH PROPOFOL N/A 04/28/2020   Procedure: ESOPHAGOGASTRODUODENOSCOPY (EGD) WITH PROPOFOL;  Surgeon: BOtis Brace MD;  [place]  Service: Gastroenterology;  Laterality: N/A;   ESOPHAGOGASTRODUODENOSCOPY (EGD) WITH PROPOFOL N/A 11/27/2020   Surgeon: CMontez Morita DQuillian Quince MD; findings suggestive of short segment Barrett's, normal stomach, and small bowel.   FEMORAL ARTERY EXPLORATION Right 09/28/2020   Procedure: RIGHT GROIN EXPLORATION WITH REPAIR RIGHT COMMON FEMORAL ARTERY;  Surgeon: ERosetta Posner MD;  [facility];  Service: Vascular;  Laterality: Right;   GIVENS CAPSULE STUDY  05/09/2012   RMR: an unclear raised area of small bowel was noted, with features almost  characteristic of very small polyp. This was  without villous  blunting or any evidence of active bleeding; yet, the area of  concern appeared to be erythematous. However, this could simply  be a light reflection on a normal variation of the small bowel. REFERRED TO DR. GILLIAM, APPT FOR NOVEMBER 2013.    GIVENS CAPSULE STUDY N/A 04/30/2020   Procedure: GIVENS CAPSULE STUDY;  Surgeon: Otis Brace, MD;  Location: [place];  Service: Gastroenterology;  Laterality: N/A;   [place] STUDY  11/27/2020    Surgeon: Montez Morita, Quillian Quince, MD;   questionable nodule/small mass in the distal small bowel but without ulceration.  Evidence of hematochezia throughout the colon but no fresh blood.    HIP ARTHROPLASTY Right    INSERT / REPLACE / REMOVE PACEMAKER     PACEMAKER IMPLANT N/A 07/12/2021   Procedure: PACEMAKER IMPLANT;  Surgeon: Vickie Epley, MD;  Location: [place] [facility];  Service: Cardiovascular;  Laterality: N/A;   PATCH ANGIOPLASTY Right 10/21/2019   Procedure: PATCH ANGIOPLASTY USING Rueben Bash BIOLOGIC PATCH;  Surgeon: Waynetta Sandy, MD;  Location: [place];  Service: Vascular;  Laterality: Right;   POLYPECTOMY  04/28/2020   Procedure: POLYPECTOMY;  Surgeon: Otis Brace, MD;  Location: [place] [facility];  Service: Gastroenterology;;   RIGHT/LEFT HEART CATH AND CORONARY ANGIOGRAPHY N/A 09/08/2020   Procedure: RIGHT/LEFT HEART CATH AND CORONARY ANGIOGRAPHY;  Surgeon: Sherren Mocha, MD;  Location: [place] [facility];  Service: Cardiovascular;  Laterality: N/A;   TEE WITHOUT CARDIOVERSION N/A 09/28/2020   Procedure: TRANSESOPHAGEAL ECHOCARDIOGRAM (TEE);  Surgeon: Sherren Mocha, MD;  Location: [place];  Service: Open Heart Surgery;  Laterality: N/A;   TONSILLECTOMY     TOTAL HIP ARTHROPLASTY  1995   Right   TRANSCATHETER AORTIC VALVE REPLACEMENT, TRANSFEMORAL N/A 09/28/2020   Procedure: TRANSCATHETER AORTIC VALVE REPLACEMENT, TRANSFEMORAL;  Surgeon: Sherren Mocha, MD;  Location: [place];  Service: Open Heart Surgery;  Laterality: N/A;    Current Medications: No outpatient medications have been marked as taking for the 08/09/21 encounter (Appointment) with Vickie Epley, MD.     Allergies:   Patient has no known allergies.   Social History   Socioeconomic History   Marital status: Divorced    Spouse name: Not on file   Number of children: 4   Years of education: 6th grade   Highest education level: Not on file  Occupational History   Occupation: Retired    Comment: [employer]  Tobacco Use   Smoking  status: Never   Smokeless tobacco: Never   Tobacco comments:    Quit x 50 years  Vaping Use   Vaping Use: Never used  Substance and Sexual Activity   Alcohol use: Never   Drug use: Never   Sexual activity: Not Currently  Other Topics Concern   Not on file  Social History Narrative   Lives alone. Every other week she has family with her. Daughter and son take care of her during the days.    Right-handed.   No daily use of caffeine.             Social Determinants of Health   Financial Resource Strain: Not on file  Food Insecurity: Not on file  Transportation Needs: Not on file  Physical Activity: Not on file  Stress: Not on file  Social Connections: Not on file     Family History: The patient's family history includes CAD in her father; Cancer in her brother; Cirrhosis in her mother; Diabetes in her mother; Heart failure in her  father; Hypertension in her father; Uterine cancer in her sister. There is no history of Colon cancer.  ROS:   Please see the history of present illness.    All other systems reviewed and are negative.  EKGs/Labs/Other Studies Reviewed:    The following studies were reviewed today:  Remote device interrogations  EKG:  The ekg ordered today demonstrates ***  Recent Labs: 07/11/2021: Hemoglobin 11.4; Magnesium 2.1; Platelets 196 07/12/2021: ALT 15; B Natriuretic Peptide 200.2; BUN 20; Creatinine, Ser 1.19; Potassium 4.1; Sodium 138; TSH 4.634  Recent Lipid Panel    Component Value Date/Time   CHOL 100 04/08/2020 0441   TRIG 83 04/08/2020 0441   HDL 35 (L) 04/08/2020 0441   CHOLHDL 2.9 04/08/2020 0441   VLDL 17 04/08/2020 0441   LDLCALC 48 04/08/2020 0441    Physical Exam:    VS:  There were no vitals taken for this visit.    Wt Readings from Last 3 Encounters:  08/04/21 164 lb 6.4 oz (74.6 kg)  07/13/21 166 lb 0.1 oz (75.3 kg)  06/08/21 163 lb 6.4 oz (74.1 kg)     GEN: *** Well nourished, well developed in no acute  distress HEENT: Normal NECK: No JVD; No carotid bruits LYMPHATICS: No lymphadenopathy CARDIAC: ***RRR, no murmurs, rubs, gallops RESPIRATORY:  Clear to auscultation without rales, wheezing or rhonchi  ABDOMEN: Soft, non-tender, non-distended MUSCULOSKELETAL:  No edema; No deformity  SKIN: Warm and dry NEUROLOGIC:  Alert and oriented x 3 PSYCHIATRIC:  Normal affect        ASSESSMENT:    No diagnosis found. PLAN:    In order of problems listed above:   Risk/benefit conversation about anticoagulation given history of bleeding        Total time spent with patient today *** minutes. This includes reviewing records, evaluating the patient and coordinating care.   Medication Adjustments/Labs and Tests Ordered: Current medicines are reviewed at length with the patient today.  Concerns regarding medicines are outlined above.  No orders of the defined types were placed in this encounter.  No orders of the defined types were placed in this encounter.    Signed, Lars Mage, MD, [facility], [facility] 08/09/2021 5:53 AM    Electrophysiology [facility]

## 2021-08-24 NOTE — Progress Notes (Signed)
Electrophysiology Office Follow up Visit Note:    Date:  08/25/2021   ID:  Denise Parrish, Denise Parrish June 20, 1923, MRN [id number]  PCP:  [name], MD  [facility] Cardiologist:  Jenkins Rouge, MD  [facility] Electrophysiologist:  Vickie Epley, MD    Interval History:    Denise Parrish is a 87 y.o. female who presents for a follow up visit. She had a pacemaker implanted 07/12/2021 with a left bundle area pacemaker lead.  Device interrogation since implant has shown 3 episodes of atrial fibrillation lasting less than 4 minutes.  Lead parameters have been stable.  She presents today for follow-up and to discuss the atrial high rate episodes.  She has a history of GI bleeding with anticoagulation and antiplatelet therapy.  We had a long discussion during today's clinic appointment about her bleeding history and her hesitancy to start an anticoagulant.       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 ROk Edwardsdiverticulosis, negative microscopic  colitis    COLONOSCOPY WITH PROPOFOL N/A 11/27/2020   Surgeon: CHarvel Quale MD;  diverticulosis, no active bleeding.  Two 2-4 mm polyps not removed in the setting of Plavix.   CORONARY STENT INTERVENTION N/A 09/08/2020   Procedure: CORONARY STENT INTERVENTION;  Surgeon: CSherren Mocha MD;  [facility]  Service: Cardiovascular;  Laterality: N/A;   ENDARTERECTOMY Right 10/21/2019   Procedure: ENDARTERECTOMY CAROTID RIGHT;  Surgeon: CWaynetta Sandy MD;  [place] [facility]  Service: Vascular;  Laterality: Right;   ESOPHAGOGASTRODUODENOSCOPY  04/03/2012   Denise. RJennet Madurohernia, chronic gastritis on bx   ESOPHAGOGASTRODUODENOSCOPY (EGD) WITH PROPOFOL N/A 04/28/2020   Procedure: ESOPHAGOGASTRODUODENOSCOPY (EGD) WITH PROPOFOL;  Surgeon: BOtis Brace MD;  [place]  Service: Gastroenterology;  Laterality: N/A;   ESOPHAGOGASTRODUODENOSCOPY (EGD) WITH PROPOFOL N/A 11/27/2020   Surgeon: CMontez Parrish DQuillian Quince MD; findings suggestive of short segment Barrett's, normal stomach, and small bowel.   FEMORAL ARTERY EXPLORATION Right 09/28/2020   Procedure: RIGHT GROIN EXPLORATION WITH REPAIR RIGHT COMMON FEMORAL ARTERY;  Surgeon: ERosetta Posner MD;  [place]  Service: Vascular;  Laterality: Right;   GIVENS CAPSULE STUDY  05/09/2012   RMR:  an unclear raised area of small bowel was noted, with features almost  characteristic of very small polyp. This was without villous  blunting or any evidence of active bleeding; yet, the area of  concern appeared to be erythematous. However, this could simply  be a light reflection on a normal variation of the small bowel. REFERRED TO Denise. GILLIAM, APPT FOR NOVEMBER 2013.    GIVENS CAPSULE STUDY N/A 04/30/2020   Procedure: GIVENS CAPSULE STUDY;  Surgeon: Otis Brace, MD;  Location:  [place];  Service: Gastroenterology;  Laterality: N/A;   [place] STUDY  11/27/2020    Surgeon: Montez Parrish, Quillian Quince, MD;  questionable nodule/small mass in the distal small bowel but without ulceration.  Evidence of hematochezia throughout the colon but no fresh blood.    HIP ARTHROPLASTY Right    INSERT / REPLACE / REMOVE PACEMAKER     PACEMAKER IMPLANT N/A 07/12/2021   Procedure: PACEMAKER IMPLANT;  Surgeon: Vickie Epley, MD;  Location: [place] [facility];  Service: Cardiovascular;  Laterality: N/A;   PATCH ANGIOPLASTY Right 10/21/2019   Procedure: PATCH ANGIOPLASTY USING Rueben Bash BIOLOGIC PATCH;  Surgeon: Waynetta Sandy, MD;  Location: [place];  Service: Vascular;  Laterality: Right;   POLYPECTOMY  04/28/2020   Procedure: POLYPECTOMY;  Surgeon: Otis Brace, MD;  Location: [place] [facility];  Service: Gastroenterology;;   RIGHT/LEFT HEART CATH AND CORONARY ANGIOGRAPHY N/A 09/08/2020   Procedure: RIGHT/LEFT HEART CATH AND CORONARY ANGIOGRAPHY;  Surgeon: Sherren Mocha, MD;  Location: [place] [facility];  Service: Cardiovascular;  Laterality: N/A;   TEE WITHOUT CARDIOVERSION N/A 09/28/2020   Procedure: TRANSESOPHAGEAL ECHOCARDIOGRAM (TEE);  Surgeon: Sherren Mocha, MD;  Location: [place];  Service: Open Heart Surgery;  Laterality: N/A;   TONSILLECTOMY     TOTAL HIP ARTHROPLASTY  1995   Right   TRANSCATHETER AORTIC VALVE REPLACEMENT, TRANSFEMORAL N/A 09/28/2020   Procedure: TRANSCATHETER AORTIC VALVE REPLACEMENT, TRANSFEMORAL;  Surgeon: Sherren Mocha, MD;  Location: [place];  Service: Open Heart Surgery;  Laterality: N/A;    Current Medications: Current Meds  Medication Sig   acetaminophen (TYLENOL) 500 MG tablet Take 500 mg by mouth every 8 (eight) hours as needed for mild pain.   ALPRAZolam (XANAX) 0.5 MG tablet Take 0.5 mg by mouth at bedtime.   atorvastatin (LIPITOR) 40 MG tablet Take 1 tablet (40 mg total) by mouth daily at 6 PM.   clopidogrel (PLAVIX)  75 MG tablet Take 1 tablet (75 mg total) by mouth daily with breakfast.   ferrous sulfate 325 (65 FE) MG tablet Take 1 tablet (325 mg total) by mouth daily.   glimepiride (AMARYL) 1 MG tablet Take 1 mg by mouth daily with breakfast.   Glycerin-Hypromellose-PEG 400 (VISINE DRY EYE OP) Place 1 drop into both eyes daily as needed (for dry eye relief).   LUTEIN PO Take 1 capsule by mouth daily.   meclizine (ANTIVERT) 12.5 MG tablet Take 1 tablet (12.5 mg total) by mouth 3 (three) times daily as needed for dizziness.   metoprolol tartrate (LOPRESSOR) 25 MG tablet Take 0.5 tablets (12.5 mg total) by mouth 2 (two) times daily.   Multiple Vitamins-Minerals (PRESERVISION AREDS 2 PO) Take 1 tablet by mouth daily. Takes 1 tablet in the morning and 1 tablet at night for eyes.   pantoprazole (PROTONIX) 40 MG tablet Take 1 tablet (40 mg total) by mouth daily.   vitamin C (ASCORBIC ACID) 500 MG tablet Take 500 mg by mouth daily.     Allergies:  Patient has no known allergies.   Social History   Socioeconomic History   Marital status: Divorced    Spouse name: Not on file   Number of children: 4   Years of education: 6th grade   Highest education level: Not on file  Occupational History   Occupation: Retired    Comment: [employer]  Tobacco Use   Smoking status: Never   Smokeless tobacco: Never   Tobacco comments:    Quit x 50 years  Vaping Use   Vaping Use: Never used  Substance and Sexual Activity   Alcohol use: Never   Drug use: Never   Sexual activity: Not Currently  Other Topics Concern   Not on file  Social History Narrative   Lives alone. Every other week she has family with her. Daughter and son take care of her during the days.    Right-handed.   No daily use of caffeine.             Social Determinants of Health   Financial Resource Strain: Not on file  Food Insecurity: Not on file  Transportation Needs: Not on file  Physical Activity: Not on file  Stress: Not on file  Social  Connections: Not on file     Family History: The patient's family history includes CAD in her father; Cancer in her brother; Cirrhosis in her mother; Diabetes in her mother; Heart failure in her father; Hypertension in her father; Uterine cancer in her sister. There is no history of Colon cancer.  ROS:   Please see the history of present illness.    All other systems reviewed and are negative.  EKGs/Labs/Other Studies Reviewed:    The following studies were reviewed today:  August 25, 2021 in clinic device interrogation personally reviewed 41% atrially pacing Since her wound check she has not had any A. fib episodes  EKG:  The ekg ordered today demonstrates atrial pacing, ventricular sensing  Recent Labs: 07/11/2021: Hemoglobin 11.4; Magnesium 2.1; Platelets 196 07/12/2021: ALT 15; B Natriuretic Peptide 200.2; BUN 20; Creatinine, Ser 1.19; Potassium 4.1; Sodium 138; TSH 4.634  Recent Lipid Panel    Component Value Date/Time   CHOL 100 04/08/2020 0441   TRIG 83 04/08/2020 0441   HDL 35 (L) 04/08/2020 0441   CHOLHDL 2.9 04/08/2020 0441   VLDL 17 04/08/2020 0441   LDLCALC 48 04/08/2020 0441    Physical Exam:    VS:  BP 124/68   Pulse 62   Ht 5' 5"  (1.651 m)   Wt 165 lb 8 oz (75.1 kg)   SpO2 97%   BMI 27.54 kg/m     Wt Readings from Last 3 Encounters:  08/25/21 165 lb 8 oz (75.1 kg)  08/04/21 164 lb 6.4 oz (74.6 kg)  07/13/21 166 lb 0.1 oz (75.3 kg)     GEN:  Well nourished, well developed in no acute distress.  Elderly HEENT: Normal NECK: No JVD; No carotid bruits LYMPHATICS: No lymphadenopathy CARDIAC: RRR, no murmurs, rubs, gallops RESPIRATORY:  Clear to auscultation without rales, wheezing or rhonchi  ABDOMEN: Soft, non-tender, non-distended MUSCULOSKELETAL:  No edema; No deformity  SKIN: Warm and dry NEUROLOGIC:  Alert and oriented x 3 PSYCHIATRIC:  Normal affect        ASSESSMENT:    1. AV block, complete (HCC)   2. Pacemaker   3. Severe aortic  stenosis   4. S/P TAVR (transcatheter aortic valve replacement)   5. Paroxysmal atrial fibrillation (HCC)    PLAN:  In order of problems listed above:  #Complete heart block post permanent pacemaker Pacemaker functioning appropriately.  Lead parameters are stable.  Has upcoming 90-day check.  #Device detected atrial fibrillation Patient does have an elevated CHA2DS2-VASc with a history of stroke.  We discussed anticoagulation during today's appointment but the patient and her family who is with her today are both very clear that they would like to avoid long-term exposure anticoagulation because of a history of significant bleeding.  I think this is actually very reasonable given her age and comorbidities.  For now, would recommend continuing Plavix monotherapy.  We did discuss how not being on an anticoagulant does yield a higher stroke risk but again, in light of her history of significant GI bleeding I think it is reasonable for her to stay off the blood thinner.  #Severe aortic stenosis post TAVR Prosthesis appears to be functioning appropriately    Medication Adjustments/Labs and Tests Ordered: Current medicines are reviewed at length with the patient today.  Concerns regarding medicines are outlined above.  Orders Placed This Encounter  Procedures   EKG 12-Lead   No orders of the defined types were placed in this encounter.    Signed, Lars Mage, MD, [facility], [facility] 08/25/2021 10:23 AM    Electrophysiology [facility]

## 2021-08-25 ENCOUNTER — Other Ambulatory Visit: Payer: Self-pay

## 2021-08-25 ENCOUNTER — Ambulatory Visit (INDEPENDENT_AMBULATORY_CARE_PROVIDER_SITE_OTHER): Payer: Medicare Other | Admitting: Cardiology

## 2021-08-25 ENCOUNTER — Encounter: Payer: Self-pay | Admitting: Cardiology

## 2021-08-25 VITALS — BP 124/68 | HR 62 | Ht 65.0 in | Wt 165.5 lb

## 2021-08-25 DIAGNOSIS — I350 Nonrheumatic aortic (valve) stenosis: Secondary | ICD-10-CM

## 2021-08-25 DIAGNOSIS — I442 Atrioventricular block, complete: Secondary | ICD-10-CM

## 2021-08-25 DIAGNOSIS — Z952 Presence of prosthetic heart valve: Secondary | ICD-10-CM | POA: Diagnosis not present

## 2021-08-25 DIAGNOSIS — Z950 Presence of cardiac pacemaker: Secondary | ICD-10-CM

## 2021-08-25 DIAGNOSIS — I480 Paroxysmal atrial fibrillation: Secondary | ICD-10-CM

## 2021-08-25 NOTE — Patient Instructions (Addendum)
Medication Instructions:  Your physician recommends that you continue on your current medications as directed. Please refer to the Current Medication list given to you today. *If you need a refill on your cardiac medications before your next appointment, please call your pharmacy*  Lab Work: None. If you have labs (blood work) drawn today and your tests are completely normal, you will receive your results only by: [place] (if you have MyChart) OR A paper copy in the mail If you have any lab test that is abnormal or we need to change your treatment, we will call you to review the results.  Testing/Procedures: None.  Follow-Up: At [facility], you and your health needs are our priority.  As part of our continuing mission to provide you with exceptional heart care, we have created designated Provider Care Teams.  These Care Teams include your primary Cardiologist (physician) and Advanced Practice Providers (APPs -  Physician Assistants and Nurse Practitioners) who all work together to provide you with the care you need, when you need it.  Your physician wants you to follow-up in: 10/14/21 at 1:45 pm with  Lars Mage, MD   Remote monitoring is used to monitor your Pacemaker from home. This monitoring reduces the number of office visits required to check your device to one time per year. It allows [place] to keep an eye on the functioning of your device to ensure it is working properly. You are scheduled for a device check from home on 10/12/21. You may send your transmission at any time that day. If you have a wireless device, the transmission will be sent automatically. After your physician reviews your transmission, you will receive a postcard with your next transmission date.  We recommend signing up for the patient portal called "MyChart".  Sign up information is provided on this After Visit Summary.  MyChart is used to connect with patients for Virtual Visits (Telemedicine).  Patients  are able to view lab/test results, encounter notes, upcoming appointments, etc.  Non-urgent messages can be sent to your provider as well.   To learn more about what you can do with MyChart, go to [email or web address].    Any Other Special Instructions Will Be Listed Below (If Applicable).

## 2021-08-30 ENCOUNTER — Other Ambulatory Visit: Payer: Self-pay

## 2021-08-30 DIAGNOSIS — R7989 Other specified abnormal findings of blood chemistry: Secondary | ICD-10-CM

## 2021-08-30 DIAGNOSIS — E611 Iron deficiency: Secondary | ICD-10-CM

## 2021-08-30 DIAGNOSIS — D509 Iron deficiency anemia, unspecified: Secondary | ICD-10-CM

## 2021-09-09 ENCOUNTER — Other Ambulatory Visit: Payer: Self-pay | Admitting: Cardiovascular Disease

## 2021-09-23 LAB — CBC WITH DIFFERENTIAL/PLATELET
Absolute Monocytes: 637 cells/uL (ref 200–950)
Basophils Absolute: 18 cells/uL (ref 0–200)
Basophils Relative: 0.3 %
Eosinophils Absolute: 100 cells/uL (ref 15–500)
Eosinophils Relative: 1.7 %
HCT: 39.5 % (ref 35.0–45.0)
Hemoglobin: 12.8 g/dL (ref 11.7–15.5)
Lymphs Abs: 2036 cells/uL (ref 850–3900)
MCH: 30.4 pg (ref 27.0–33.0)
MCHC: 32.4 g/dL (ref 32.0–36.0)
MCV: 93.8 fL (ref 80.0–100.0)
MPV: 10.2 fL (ref 7.5–12.5)
Monocytes Relative: 10.8 %
Neutro Abs: 3109 cells/uL (ref 1500–7800)
Neutrophils Relative %: 52.7 %
Platelets: 213 10*3/uL (ref 140–400)
RBC: 4.21 10*6/uL (ref 3.80–5.10)
RDW: 13.6 % (ref 11.0–15.0)
Total Lymphocyte: 34.5 %
WBC: 5.9 10*3/uL (ref 3.8–10.8)

## 2021-09-23 LAB — IRON,TIBC AND FERRITIN PANEL
%SAT: 18 % (calc) (ref 16–45)
Ferritin: 39 ng/mL (ref 16–288)
Iron: 60 ug/dL (ref 45–160)
TIBC: 338 mcg/dL (calc) (ref 250–450)

## 2021-10-11 LAB — CUP PACEART REMOTE DEVICE CHECK
Date Time Interrogation Session: 20230124081049
Implantable Lead Implant Date: 20221026
Implantable Lead Implant Date: 20221026
Implantable Lead Location: 753859
Implantable Lead Location: 753860
Implantable Lead Model: 377171
Implantable Lead Model: 377171
Implantable Lead Serial Number: 8000595070
Implantable Lead Serial Number: 8000606642
Implantable Pulse Generator Implant Date: 20221026
Pulse Gen Model: 407145
Pulse Gen Serial Number: 70223175

## 2021-10-12 ENCOUNTER — Ambulatory Visit (INDEPENDENT_AMBULATORY_CARE_PROVIDER_SITE_OTHER): Payer: Medicare Other

## 2021-10-12 DIAGNOSIS — I442 Atrioventricular block, complete: Secondary | ICD-10-CM

## 2021-10-14 ENCOUNTER — Encounter (INDEPENDENT_AMBULATORY_CARE_PROVIDER_SITE_OTHER): Payer: Self-pay

## 2021-10-14 ENCOUNTER — Ambulatory Visit (INDEPENDENT_AMBULATORY_CARE_PROVIDER_SITE_OTHER): Payer: Medicare Other | Admitting: Cardiology

## 2021-10-14 ENCOUNTER — Other Ambulatory Visit: Payer: Self-pay

## 2021-10-14 ENCOUNTER — Encounter: Payer: Self-pay | Admitting: Cardiology

## 2021-10-14 VITALS — BP 118/64 | HR 72 | Ht 65.0 in | Wt 170.0 lb

## 2021-10-14 DIAGNOSIS — I442 Atrioventricular block, complete: Secondary | ICD-10-CM

## 2021-10-14 DIAGNOSIS — Z952 Presence of prosthetic heart valve: Secondary | ICD-10-CM

## 2021-10-14 DIAGNOSIS — I350 Nonrheumatic aortic (valve) stenosis: Secondary | ICD-10-CM | POA: Diagnosis not present

## 2021-10-14 DIAGNOSIS — Z950 Presence of cardiac pacemaker: Secondary | ICD-10-CM

## 2021-10-14 LAB — PACEMAKER DEVICE OBSERVATION

## 2021-10-14 NOTE — Patient Instructions (Signed)
Medication Instructions:  Your physician recommends that you continue on your current medications as directed. Please refer to the Current Medication list given to you today. *If you need a refill on your cardiac medications before your next appointment, please call your pharmacy*  Lab Work: None. If you have labs (blood work) drawn today and your tests are completely normal, you will receive your results only by: [place] (if you have MyChart) OR A paper copy in the mail If you have any lab test that is abnormal or we need to change your treatment, we will call you to review the results.  Testing/Procedures: None.  Follow-Up: At [facility], you and your health needs are our priority.  As part of our continuing mission to provide you with exceptional heart care, we have created designated Provider Care Teams.  These Care Teams include your primary Cardiologist (physician) and Advanced Practice Providers (APPs -  Physician Assistants and Nurse Practitioners) who all work together to provide you with the care you need, when you need it.  Your physician wants you to follow-up in: 9 months with  one of the following Advanced Practice Providers on your designated Care Team:    Tommye Standard, PA-C Legrand Como "Jonni Sanger" [place], [place]   You will receive a reminder letter in the mail two months in advance. If you don't receive a letter, please call our office to schedule the follow-up appointment.  Remote monitoring is used to monitor your Pacemaker from home. This monitoring reduces the number of office visits required to check your device to one time per year. It allows [place] to keep an eye on the functioning of your device to ensure it is working properly. You are scheduled for a device check from home on 01/11/22. You may send your transmission at any time that day. If you have a wireless device, the transmission will be sent automatically. After your physician reviews your transmission, you will  receive a postcard with your next transmission date.  We recommend signing up for the patient portal called "MyChart".  Sign up information is provided on this After Visit Summary.  MyChart is used to connect with patients for Virtual Visits (Telemedicine).  Patients are able to view lab/test results, encounter notes, upcoming appointments, etc.  Non-urgent messages can be sent to your provider as well.   To learn more about what you can do with MyChart, go to [email or web address].    Any Other Special Instructions Will Be Listed Below (If Applicable).

## 2021-10-14 NOTE — Progress Notes (Signed)
Electrophysiology Office Follow up Visit Note:    Date:  10/14/2021   ID:  Alyn, Riedinger 1922-10-28, MRN [id number]  PCP:  [name], MD  [facility] Cardiologist:  Jenkins Rouge, MD  [facility] Electrophysiologist:  Vickie Epley, MD    Interval History:    Denise Parrish is a 88 y.o. female who presents for a follow up visit.  She had a permanent pacemaker implanted July 12, 2021.  Device interrogations since that time have showed stable lead parameters and device function.       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 Ok Edwards diverticulosis, negative microscopic  colitis    COLONOSCOPY WITH PROPOFOL N/A 11/27/2020   Surgeon: Harvel Quale, MD;  diverticulosis, no active bleeding.  Two 2-4 mm polyps not removed in the setting of Plavix.   CORONARY STENT INTERVENTION N/A 09/08/2020    Procedure: CORONARY STENT INTERVENTION;  Surgeon: Sherren Mocha, MD;  Location: [place] [facility];  Service: Cardiovascular;  Laterality: N/A;   ENDARTERECTOMY Right 10/21/2019   Procedure: ENDARTERECTOMY CAROTID RIGHT;  Surgeon: Waynetta Sandy, MD;  Location: [place];  Service: Vascular;  Laterality: Right;   ESOPHAGOGASTRODUODENOSCOPY  04/03/2012   Dr. Jennet Maduro hernia, chronic gastritis on bx   ESOPHAGOGASTRODUODENOSCOPY (EGD) WITH PROPOFOL N/A 04/28/2020   Procedure: ESOPHAGOGASTRODUODENOSCOPY (EGD) WITH PROPOFOL;  Surgeon: Otis Brace, MD;  Location: [place];  Service: Gastroenterology;  Laterality: N/A;   ESOPHAGOGASTRODUODENOSCOPY (EGD) WITH PROPOFOL N/A 11/27/2020   Surgeon: Montez Morita, Quillian Quince, MD; findings suggestive of short segment Barrett's, normal stomach, and small bowel.   FEMORAL ARTERY EXPLORATION Right 09/28/2020   Procedure: RIGHT GROIN EXPLORATION WITH REPAIR RIGHT COMMON FEMORAL ARTERY;  Surgeon: [name], MD;  Location: [facility];  Service: Vascular;  Laterality: Right;   GIVENS CAPSULE STUDY  05/09/2012   RMR: an unclear raised area of small bowel was noted, with features almost  characteristic of very small polyp. This was without villous  blunting or any evidence of active bleeding; yet, the area of  concern appeared to be erythematous. However, this could simply  be a light reflection on a normal variation of the small bowel. REFERRED TO DR.  GILLIAM, APPT FOR NOVEMBER 2013.    GIVENS CAPSULE STUDY N/A 04/30/2020   Procedure: GIVENS CAPSULE STUDY;  Surgeon: Otis Brace, MD;  Location: [place];  Service: Gastroenterology;  Laterality: N/A;   [place] STUDY  11/27/2020    Surgeon: Montez Morita, Quillian Quince, MD;  questionable nodule/small mass in the distal small bowel but without ulceration.  Evidence of hematochezia throughout the colon but no fresh blood.    HIP ARTHROPLASTY Right    INSERT / REPLACE / REMOVE PACEMAKER      PACEMAKER IMPLANT N/A 07/12/2021   Procedure: PACEMAKER IMPLANT;  Surgeon: Vickie Epley, MD;  Location: [place] [facility];  Service: Cardiovascular;  Laterality: N/A;   PATCH ANGIOPLASTY Right 10/21/2019   Procedure: PATCH ANGIOPLASTY USING Rueben Bash BIOLOGIC PATCH;  Surgeon: Waynetta Sandy, MD;  Location: [place];  Service: Vascular;  Laterality: Right;   POLYPECTOMY  04/28/2020   Procedure: POLYPECTOMY;  Surgeon: Otis Brace, MD;  Location: [place] [facility];  Service: Gastroenterology;;   RIGHT/LEFT HEART CATH AND CORONARY ANGIOGRAPHY N/A 09/08/2020   Procedure: RIGHT/LEFT HEART CATH AND CORONARY ANGIOGRAPHY;  Surgeon: Sherren Mocha, MD;  Location: [place] [facility];  Service: Cardiovascular;  Laterality: N/A;   TEE WITHOUT CARDIOVERSION N/A 09/28/2020   Procedure: TRANSESOPHAGEAL ECHOCARDIOGRAM (TEE);  Surgeon: Sherren Mocha, MD;  Location: [place];  Service: Open Heart Surgery;  Laterality: N/A;   TONSILLECTOMY     TOTAL HIP ARTHROPLASTY  1995   Right   TRANSCATHETER AORTIC VALVE REPLACEMENT, TRANSFEMORAL N/A 09/28/2020   Procedure: TRANSCATHETER AORTIC VALVE REPLACEMENT, TRANSFEMORAL;  Surgeon: Sherren Mocha, MD;  Location: [place];  Service: Open Heart Surgery;  Laterality: N/A;    Current Medications: Current Meds  Medication Sig   acetaminophen (TYLENOL) 500 MG tablet Take 500 mg by mouth every 8 (eight) hours as needed for mild pain.   ALPRAZolam (XANAX) 0.5 MG tablet Take 0.5 mg by mouth at bedtime.   atorvastatin (LIPITOR) 40 MG tablet Take 1 tablet (40 mg total) by mouth daily at 6 PM.   clopidogrel (PLAVIX) 75 MG tablet Take 1 tablet (75 mg total) by mouth daily with breakfast.   ferrous sulfate 325 (65 FE) MG tablet Take 1 tablet (325 mg total) by mouth daily.   glimepiride (AMARYL) 1 MG tablet Take 1 mg by mouth daily with breakfast.   Glycerin-Hypromellose-PEG 400 (VISINE DRY EYE OP) Place 1 drop into both eyes daily as needed (for dry eye relief).    LUTEIN PO Take 1 capsule by mouth daily.   meclizine (ANTIVERT) 12.5 MG tablet Take 1 tablet (12.5 mg total) by mouth 3 (three) times daily as needed for dizziness.   metoprolol tartrate (LOPRESSOR) 25 MG tablet Take 0.5 tablets (12.5 mg total) by mouth 2 (two) times daily.   Multiple Vitamins-Minerals (PRESERVISION AREDS 2 PO) Take 1 tablet by mouth daily. Takes 1 tablet in the morning and 1 tablet at night for eyes.   pantoprazole (PROTONIX) 40 MG tablet TAKE ONE TABLET (40 MG TOTAL) BY MOUTH ONCE DAILY.   vitamin C (ASCORBIC ACID) 500 MG tablet Take 500 mg by mouth daily.     Allergies:   Patient has no known allergies.   Social History   Socioeconomic History   Marital status: Divorced    Spouse name: Not on file   Number of children: 4   Years of education: 6th grade   Highest education level: Not on file  Occupational History   Occupation: Retired    Comment:  SNF  Tobacco Use   Smoking status: Never   Smokeless tobacco: Never   Tobacco comments:    Quit x 50 years  Vaping Use   Vaping Use: Never used  Substance and Sexual Activity   Alcohol use: Never   Drug use: Never   Sexual activity: Not Currently  Other Topics Concern   Not on file  Social History Narrative   Lives alone. Every other week she has family with her. Daughter and son take care of her during the days.    Right-handed.   No daily use of caffeine.             Social Determinants of Health   Financial Resource Strain: Not on file  Food Insecurity: Not on file  Transportation Needs: Not on file  Physical Activity: Not on file  Stress: Not on file  Social Connections: Not on file     Family History: The patient's family history includes CAD in her father; Cancer in her brother; Cirrhosis in her mother; Diabetes in her mother; Heart failure in her father; Hypertension in her father; Uterine cancer in her sister. There is no history of Colon cancer.  ROS:   Please see the history of present  illness.    All other systems reviewed and are negative.  EKGs/Labs/Other Studies Reviewed:    The following studies were reviewed today:  October 14, 2021 in clinic device interrogation personally reviewed Battery longevity 8 years 5 months, Lead parameter stable Atrial pacing 47% Ventricular pacing 100% Changes made to maximize battery longevity today  EKG:  The ekg ordered today demonstrates atrial sensing, ventricular pacing.  QRS duration 90 ms  Recent Labs: 07/11/2021: Magnesium 2.1 07/12/2021: ALT 15; B Natriuretic Peptide 200.2; BUN 20; Creatinine, Ser 1.19; Potassium 4.1; Sodium 138; TSH 4.634 09/22/2021: Hemoglobin 12.8; Platelets 213  Recent Lipid Panel    Component Value Date/Time   CHOL 100 04/08/2020 0441   TRIG 83 04/08/2020 0441   HDL 35 (L) 04/08/2020 0441   CHOLHDL 2.9 04/08/2020 0441   VLDL 17 04/08/2020 0441   LDLCALC 48 04/08/2020 0441    Physical Exam:    VS:  BP 118/64    Pulse 72    Ht 5\' 5"  (1.651 m)    Wt 170 lb (77.1 kg)    SpO2 93%    BMI 28.29 kg/m     Wt Readings from Last 3 Encounters:  10/14/21 170 lb (77.1 kg)  08/25/21 165 lb 8 oz (75.1 kg)  08/04/21 164 lb 6.4 oz (74.6 kg)     GEN:  Well nourished, well developed in no acute distress.  Elderly HEENT: Normal NECK: No JVD; No carotid bruits LYMPHATICS: No lymphadenopathy CARDIAC: RRR, no murmurs, rubs, gallops.  Pacemaker pocket well-healed RESPIRATORY:  Clear to auscultation without rales, wheezing or rhonchi  ABDOMEN: Soft, non-tender, non-distended MUSCULOSKELETAL:  No edema; No deformity  SKIN: Warm and dry NEUROLOGIC:  Alert and oriented x 3 PSYCHIATRIC:  Normal affect        ASSESSMENT:    1. AV block, complete (HCC)   2. Pacemaker   3. Severe aortic stenosis   4. S/P TAVR (transcatheter aortic valve replacement)   5. Paroxysmal atrial fibrillation (HCC)    PLAN:    In order of problems listed above:   #Complete heart block #Permanent pacemaker in  situ Patient doing well after pacemaker implant.  100% ventricular paced.  Very narrow QRS with a left bundle area lead (90 ms).  Continue remote monitoring.  #Severe aortic stenosis post TAVR Hemodynamically stable.  Doing well.     Medication Adjustments/Labs and Tests Ordered: Current medicines are reviewed at length with the patient today.  Concerns regarding medicines are outlined above.  No orders of the defined types were placed in this encounter.  No orders of the defined types were placed in this encounter.    Signed, Lars Mage, MD, [facility], [facility] 10/14/2021 9:56 PM    Electrophysiology [facility]

## 2021-10-21 NOTE — Progress Notes (Signed)
Remote pacemaker transmission.   

## 2021-10-26 ENCOUNTER — Other Ambulatory Visit (HOSPITAL_COMMUNITY): Payer: Self-pay | Admitting: Family Medicine

## 2021-10-26 ENCOUNTER — Ambulatory Visit (HOSPITAL_COMMUNITY)
Admission: RE | Admit: 2021-10-26 | Discharge: 2021-10-26 | Disposition: A | Discharge status: Home / Self Care | Payer: Medicare Other | Source: Ambulatory Visit | Attending: Family Medicine | Admitting: Family Medicine

## 2021-10-26 ENCOUNTER — Encounter (HOSPITAL_COMMUNITY): Payer: Self-pay

## 2021-10-26 ENCOUNTER — Other Ambulatory Visit (HOSPITAL_COMMUNITY)
Admission: RE | Admit: 2021-10-26 | Discharge: 2021-10-26 | Disposition: A | Discharge status: Home / Self Care | Payer: Medicare Other | Source: Ambulatory Visit | Attending: Family Medicine | Admitting: Family Medicine

## 2021-10-26 ENCOUNTER — Other Ambulatory Visit: Payer: Self-pay

## 2021-10-26 DIAGNOSIS — R0602 Shortness of breath: Secondary | ICD-10-CM | POA: Diagnosis present

## 2021-10-26 DIAGNOSIS — R7989 Other specified abnormal findings of blood chemistry: Secondary | ICD-10-CM | POA: Diagnosis present

## 2021-10-26 LAB — COMPREHENSIVE METABOLIC PANEL
ALT: 22 U/L (ref 0–44)
AST: 25 U/L (ref 15–41)
Albumin: 3.8 g/dL (ref 3.5–5.0)
Alkaline Phosphatase: 59 U/L (ref 38–126)
Anion gap: 7 (ref 5–15)
BUN: 22 mg/dL (ref 8–23)
CO2: 26 mmol/L (ref 22–32)
Calcium: 9.2 mg/dL (ref 8.9–10.3)
Chloride: 102 mmol/L (ref 98–111)
Creatinine, Ser: 1.02 mg/dL — ABNORMAL HIGH (ref 0.44–1.00)
GFR, Estimated: 50 mL/min — ABNORMAL LOW (ref >60)
Glucose, Bld: 100 mg/dL — ABNORMAL HIGH (ref 70–99)
Potassium: 4.5 mmol/L (ref 3.5–5.1)
Sodium: 135 mmol/L (ref 135–145)
Total Bilirubin: 0.7 mg/dL (ref 0.3–1.2)
Total Protein: 7 g/dL (ref 6.5–8.1)

## 2021-10-26 LAB — CBC
HCT: 38.4 % (ref 36.0–46.0)
Hemoglobin: 12.4 g/dL (ref 12.0–15.0)
MCH: 30.5 pg (ref 26.0–34.0)
MCHC: 32.3 g/dL (ref 30.0–36.0)
MCV: 94.3 fL (ref 80.0–100.0)
Platelets: 220 10*3/uL (ref 150–400)
RBC: 4.07 MIL/uL (ref 3.87–5.11)
RDW: 13.5 % (ref 11.5–15.5)
WBC: 6.6 10*3/uL (ref 4.0–10.5)
nRBC: 0 % (ref 0.0–0.2)

## 2021-10-26 LAB — BRAIN NATRIURETIC PEPTIDE: B Natriuretic Peptide: 116 pg/mL — ABNORMAL HIGH (ref 0.0–100.0)

## 2021-10-26 LAB — D-DIMER, QUANTITATIVE: D-Dimer, Quant: 1.14 ug/mL-FEU — ABNORMAL HIGH (ref 0.00–0.50)

## 2021-10-26 MED ORDER — IOHEXOL 350 MG/ML SOLN
100.0000 mL | Freq: Once | INTRAVENOUS | Status: AC | PRN
  Administered 2021-10-26: 80 mL via INTRAVENOUS

## 2021-10-26 MED ORDER — SODIUM CHLORIDE (PF) 0.9 % IJ SOLN
INTRAMUSCULAR | Status: AC
  Filled 2021-10-26: qty 50

## 2021-10-28 NOTE — Addendum Note (Signed)
Addended by: Ronaldo Miyamoto on: 10/28/2021 04:41 PM   Modules accepted: Orders

## 2021-11-01 NOTE — Progress Notes (Unsigned)
[facility] [place]                                     Cardiology Office Note:    Date:  11/01/2021   ID:  Denise Parrish, DOB [birth year], MRN [id number]  PCP:  [name], MD  [facility] Cardiologist:  Jenkins Rouge, MD / Dr. Burt Knack & Dr. Cyndia Bent (TAVR) Referring MD: [name], MD   1 year s/p TAVR  History of Present Illness:    Denise Parrish is a 88 y.o. female with a hx of mild dementia, DMT2, GERD, HLD, HTN, obesity, emphysema, hiatal hernia, history of TIA/CVA, carotid artery disease s/p right CEA 10/2019, history of GI bleeding, severe AS s/p TAVR (09/28/20) and symptomatic CHB s/p PPM (06/2021) who presents to clinic for follow up.   She has a history of aortic stenosis followed by Dr. Johnsie Cancel. She had a difficult hospitalization in August 2021 when she was admitted with a GI bleed and demand ischemia and suffered a mechanical fall in the hospital with fracture of her left fibula. She then reported progressive exertional shortness of breath and fatigue and was diagnosed with severe AS. During her work-up for aortic stenosis and consideration of TAVR she underwent cardiac catheterization on 09/08/2020 which showed a 90% left circumflex stenosis and a large caliber vessel.  This was successfully treated with drug-eluting stent, which led to an improvement in symptoms. Catheterization also showed a 60 to 70% distal RCA stenosis and mild nonobstructive LAD disease.  The mean gradient across aortic valve at catheterization was 57 mmHg with a valve area of 0.78 cm2.  She was placed on plavix monotherapy given recent GI bleed.   She was evaluated by the multidisciplinary valve team and underwent successful TAVR with a 23 mm Medtronic Evolut Pro+ THV via the TF approach on 09/28/20. Post operative echo showed normally functioning TAVR with a mean gradient of 11 mm hg and no PVL. Shortly after TAVR she developed acute right limb  ischemia that required emergent exploration and primary repair right femoral artery. She was discharged on POD 2 on monotherapy with plavix. 1 month echo showed EF 70%, normally functioning TAVR with a mean gradient of 8.3 and no PVL.  She was admitted in 11/2020 for acute GI bleed requiring transfusion. She underwent extensive GI work up and plavix was held but ultimately restarted.   She was readmitted in 06/2021 for recurrent weak spells, near syncope, observed to have intermittent CHB with pauses. She underwent successful implantation of a Biotronik dual chamber PPM on 07/12/21 by Dr Quentin Ore.   Today she presents to clinic for follow up.    Past Medical History:  Diagnosis Date   Anxiety    Carotid stenosis, right 10/16/2019   75% on CTA neck   Cholelithiasis    on u/s 09/2011   Degenerative joint disease    Right THA in 1995; cervical discectomy and fusion-2001   Diabetes mellitus, type 2 (HCC)    + neuropathy   Emphysema    Gastroesophageal reflux disease    Hiatal hernia    History of CVA (cerebrovascular accident)    Hyperlipidemia    Hypertension    IDA (iron deficiency anemia)    labs 09/2011   Mild cognitive impairment    Obesity    Osteoporosis    Pneumonia    S/P TAVR (  transcatheter aortic valve replacement) 09/28/2020   s/p TAVR with a 70mm Medtronic Evolut Pro+ via the TF approach    Severe aortic stenosis    Sigmoid diverticulosis    Small bowel lesion    On Given's capsule; Dr Kathleene Hazel 07/2012, no further w/u needed    Past Surgical History:  Procedure Laterality Date   ANTERIOR CERVICAL DISCECTOMY  2001   C4-5, allograft, fixation   BREAST LUMPECTOMY     benign   CATARACT EXTRACTION W/ INTRAOCULAR LENS IMPLANT  2007   Left   COLONOSCOPY  2006   COLONOSCOPY  04/03/2012   Dr. Ok Edwards diverticulosis, negative microscopic  colitis    COLONOSCOPY WITH PROPOFOL N/A 11/27/2020   Surgeon: Harvel Quale, MD;  diverticulosis, no active  bleeding.  Two 2-4 mm polyps not removed in the setting of Plavix.   CORONARY STENT INTERVENTION N/A 09/08/2020   Procedure: CORONARY STENT INTERVENTION;  Surgeon: Sherren Mocha, MD;  Location: [place] [facility];  Service: Cardiovascular;  Laterality: N/A;   ENDARTERECTOMY Right 10/21/2019   Procedure: ENDARTERECTOMY CAROTID RIGHT;  Surgeon: Waynetta Sandy, MD;  Location: [place];  Service: Vascular;  Laterality: Right;   ESOPHAGOGASTRODUODENOSCOPY  04/03/2012   Dr. Jennet Maduro hernia, chronic gastritis on bx   ESOPHAGOGASTRODUODENOSCOPY (EGD) WITH PROPOFOL N/A 04/28/2020   Procedure: ESOPHAGOGASTRODUODENOSCOPY (EGD) WITH PROPOFOL;  Surgeon: Otis Brace, MD;  Location: [place];  Service: Gastroenterology;  Laterality: N/A;   ESOPHAGOGASTRODUODENOSCOPY (EGD) WITH PROPOFOL N/A 11/27/2020   Surgeon: Montez Morita, Quillian Quince, MD; findings suggestive of short segment Barrett's, normal stomach, and small bowel.   FEMORAL ARTERY EXPLORATION Right 09/28/2020   Procedure: RIGHT GROIN EXPLORATION WITH REPAIR RIGHT COMMON FEMORAL ARTERY;  Surgeon: [name], MD;  Location: [facility];  Service: Vascular;  Laterality: Right;   GIVENS CAPSULE STUDY  05/09/2012   RMR: an unclear raised area of small bowel was noted, with features almost  characteristic of very small polyp. This was without villous  blunting or any evidence of active bleeding; yet, the area of  concern appeared to be erythematous. However, this could simply  be a light reflection on a normal variation of the small bowel. REFERRED TO DR. GILLIAM, APPT FOR NOVEMBER 2013.    GIVENS CAPSULE STUDY N/A 04/30/2020   Procedure: GIVENS CAPSULE STUDY;  Surgeon: Otis Brace, MD;  Location: [place];  Service: Gastroenterology;  Laterality: N/A;   [place] STUDY  11/27/2020    Surgeon: Montez Morita, Quillian Quince, MD;  questionable nodule/small mass in the distal small bowel but without ulceration.  Evidence of  hematochezia throughout the colon but no fresh blood.    HIP ARTHROPLASTY Right    INSERT / REPLACE / REMOVE PACEMAKER     PACEMAKER IMPLANT N/A 07/12/2021   Procedure: PACEMAKER IMPLANT;  Surgeon: Vickie Epley, MD;  Location: [place] [facility];  Service: Cardiovascular;  Laterality: N/A;   PATCH ANGIOPLASTY Right 10/21/2019   Procedure: PATCH ANGIOPLASTY USING Rueben Bash BIOLOGIC PATCH;  Surgeon: Waynetta Sandy, MD;  Location: [place];  Service: Vascular;  Laterality: Right;   POLYPECTOMY  04/28/2020   Procedure: POLYPECTOMY;  Surgeon: Otis Brace, MD;  Location: [facility] [facility];  Service: Gastroenterology;;   RIGHT/LEFT HEART CATH AND CORONARY ANGIOGRAPHY N/A 09/08/2020   Procedure: RIGHT/LEFT HEART CATH AND CORONARY ANGIOGRAPHY;  Surgeon: Sherren Mocha, MD;  Location: [place] [facility];  Service: Cardiovascular;  Laterality: N/A;   TEE WITHOUT CARDIOVERSION N/A 09/28/2020   Procedure: TRANSESOPHAGEAL ECHOCARDIOGRAM (TEE);  Surgeon:  Sherren Mocha, MD;  Location: [place];  Service: Open Heart Surgery;  Laterality: N/A;   TONSILLECTOMY     TOTAL HIP ARTHROPLASTY  1995   Right   TRANSCATHETER AORTIC VALVE REPLACEMENT, TRANSFEMORAL N/A 09/28/2020   Procedure: TRANSCATHETER AORTIC VALVE REPLACEMENT, TRANSFEMORAL;  Surgeon: Sherren Mocha, MD;  Location: [place];  Service: Open Heart Surgery;  Laterality: N/A;    Current Medications: No outpatient medications have been marked as taking for the 11/04/21 encounter (Appointment) with [facility].     Allergies:   Patient has no known allergies.   Social History   Socioeconomic History   Marital status: Divorced    Spouse name: Not on file   Number of children: 4   Years of education: 6th grade   Highest education level: Not on file  Occupational History   Occupation: Retired    Comment: [employer]  Tobacco Use   Smoking status: Never   Smokeless tobacco: Never   Tobacco comments:    Quit x 50 years   Vaping Use   Vaping Use: Never used  Substance and Sexual Activity   Alcohol use: Never   Drug use: Never   Sexual activity: Not Currently  Other Topics Concern   Not on file  Social History Narrative   Lives alone. Every other week she has family with her. Daughter and son take care of her during the days.    Right-handed.   No daily use of caffeine.             Social Determinants of Health   Financial Resource Strain: Not on file  Food Insecurity: Not on file  Transportation Needs: Not on file  Physical Activity: Not on file  Stress: Not on file  Social Connections: Not on file     Family History: The patient's family history includes CAD in her father; Cancer in her brother; Cirrhosis in her mother; Diabetes in her mother; Heart failure in her father; Hypertension in her father; Uterine cancer in her sister. There is no history of Colon cancer.  ROS:   Please see the history of present illness.    All other systems reviewed and are negative.  EKGs/Labs/Other Studies Reviewed:    The following studies were reviewed today: Operateive NOTE     Date of Procedure:                 09/28/2020   Preoperative Diagnosis:      Severe Aortic Stenosis    Postoperative Diagnosis:    Same    Procedure:        Transcatheter Aortic Valve Replacement - Percutaneous Right Transfemoral Approach             Medtronic Evolut-PRO+  (size 23 mm, model # EVPROPLUS-23US, serial # [id number])              Co-Surgeons:            Gaye Pollack, MD and Sherren Mocha, MD     Anesthesiologist:                  Jenita Seashore, MD   Echocardiographer:              Liane Comber, MD   Pre-operative Echo Findings: Severe aortic stenosis   Normal left ventricular systolic function   Post-operative Echo Findings: No paravalvular leak Normal left ventricular systolic function Normal mean prosthetic transvalvular gradient of 16 mm Hg.   ________________________  Echo 09/29/20:   IMPRESSIONS   1. The mitral valve is degenerative. Mild mitral valve regurgitation.  Moderate mitral stenosis. Severe mitral annular calcification.   2. Post TAVR with 23 mm supra annular Medtronic Evolut valve No  significant PVL mean gradient 11 peak 19 mmHg lower than implant DVI 0.69  and AVA 2.1 cm2. The aortic valve has been repaired/replaced.   3. There is severely elevated pulmonary artery systolic pressure.   4. The inferior vena cava is dilated in size with >50% respiratory  variability, suggesting right atrial pressure of 8 mmHg.    ____________________  Echo 10/25/20 IMPRESSIONS   1. Left ventricular ejection fraction, by estimation, is 70 to 75%. The  left ventricle has hyperdynamic function. The left ventricle has no  regional wall motion abnormalities. There is mild left ventricular  hypertrophy. Left ventricular diastolic  parameters are consistent with Grade I diastolic dysfunction (impaired  relaxation). Elevated left atrial pressure.   2. Right ventricular systolic function is normal. The right ventricular  size is normal. There is mildly elevated pulmonary artery systolic  pressure.   3. Left atrial size was mildly dilated.   4. Right atrial size was mildly dilated.   5. The mitral valve is abnormal. No evidence of mitral valve  regurgitation. Mild mitral stenosis.   6. Medtronic Evolut-PRO+ size 23 mm valve is in the AV position. The  prosthetic valve has normal function. . The aortic valve has been  repaired/replaced. Aortic valve regurgitation is not visualized. No aortic  stenosis is present.   7. The inferior vena cava is normal in size with greater than 50%  respiratory variability, suggesting right atrial pressure of 3 mmHg.   __________________________  Echo 11/04/21 ***  EKG:  EKG is NOT ordered today.    Recent Labs: 07/11/2021: Magnesium 2.1 07/12/2021: TSH 4.634 10/26/2021: ALT 22; B Natriuretic Peptide 116.0; BUN 22; Creatinine, Ser 1.02;  Hemoglobin 12.4; Platelets 220; Potassium 4.5; Sodium 135  Recent Lipid Panel    Component Value Date/Time   CHOL 100 04/08/2020 0441   TRIG 83 04/08/2020 0441   HDL 35 (L) 04/08/2020 0441   CHOLHDL 2.9 04/08/2020 0441   VLDL 17 04/08/2020 0441   LDLCALC 48 04/08/2020 0441     Risk Assessment/Calculations:       Physical Exam:    VS:  There were no vitals taken for this visit.    Wt Readings from Last 3 Encounters:  10/14/21 170 lb (77.1 kg)  08/25/21 165 lb 8 oz (75.1 kg)  08/04/21 164 lb 6.4 oz (74.6 kg)     GEN: Well nourished, well developed in no acute distress, obese HEENT: Normal NECK: No JVD LYMPHATICS: No lymphadenopathy CARDIAC: RRR, soft flow murmur @ RUSB. No rubs, gallops RESPIRATORY:  Clear to auscultation without rales, wheezing or rhonchi  ABDOMEN: Soft, non-tender, non-distended MUSCULOSKELETAL:  No edema; No deformity  SKIN: Warm and dry.  NEUROLOGIC:  Alert and oriented x 3 PSYCHIATRIC:  Normal affect   ASSESSMENT:    1. S/P TAVR (transcatheter aortic valve replacement)   2. Coronary artery disease involving nonautologous biological coronary bypass graft without angina pectoris   3. History of GI bleed   4. Pacemaker      PLAN:    In order of problems listed above:  Severe AS s/p TAVR:    CAD: pre TAVR cath showed severe 90% left circumflex stenosis (large caliber vessel) with successful PCI using a 3.5 x 12 mm resolute Onyx DES. There was  moderate to severe distal RCA stenosis of 60 to 70% and mild nonobstructive LAD stenosis. She has completed 6 months of plavix. I offered to switch this to baby aspirin now, but she is hesitant and would like to keep everything as is since she is doing so well. She will continue on monotherapy with plavix.     Hx of GI bleeding with anemia: Hg is in the normal range. 12.4 on most recent labs,  S/p PPM:    Medication Adjustments/Labs and Tests Ordered: Current medicines are reviewed at length with the  patient today.  Concerns regarding medicines are outlined above.  No orders of the defined types were placed in this encounter.   No orders of the defined types were placed in this encounter.    There are no Patient Instructions on file for this visit.   Signed, Angelena Form, PA-C  11/01/2021 4:11 PM    [facility]

## 2021-11-02 ENCOUNTER — Other Ambulatory Visit (HOSPITAL_COMMUNITY): Payer: Medicare Other

## 2021-11-02 ENCOUNTER — Ambulatory Visit: Payer: Medicare Other

## 2021-11-04 ENCOUNTER — Ambulatory Visit: Payer: Medicare Other

## 2021-11-04 ENCOUNTER — Ambulatory Visit (HOSPITAL_COMMUNITY): Payer: Medicare Other

## 2021-11-04 DIAGNOSIS — I25810 Atherosclerosis of coronary artery bypass graft(s) without angina pectoris: Secondary | ICD-10-CM

## 2021-11-04 DIAGNOSIS — Z8719 Personal history of other diseases of the digestive system: Secondary | ICD-10-CM

## 2021-11-04 DIAGNOSIS — Z950 Presence of cardiac pacemaker: Secondary | ICD-10-CM

## 2021-11-04 DIAGNOSIS — Z952 Presence of prosthetic heart valve: Secondary | ICD-10-CM

## 2021-11-15 NOTE — Progress Notes (Signed)
[facility] [place]                                     Cardiology Office Note:    Date:  11/22/2021   ID:  KAMBRY TAKACS, DOB 02-Nov-1922, MRN [id number]  PCP:  [name], MD  [facility] Cardiologist:  Jenkins Rouge, MD /Dr. Burt Knack & Dr. Cyndia Bent (TAVR) [facility] Electrophysiologist:  Vickie Epley, MD   Referring MD: [name], MD   Chief Complaint  Patient presents with   Follow-up    1 year s/p TAVR    History of Present Illness:    Denise Parrish is a 88 y.o. female with a hx of mild dementia, DMT2, GERD, HLD, HTN, obesity, emphysema, hiatal hernia, history of TIA/CVA, carotid artery disease s/p right CEA 10/2019, history of GI bleeding, severe AS s/p TAVR (09/28/20) and symptomatic CHB s/p PPM (06/2021) who presents to clinic for follow up.     She has a history of aortic stenosis followed by Dr. Johnsie Cancel. She had a difficult hospitalization in August 2021 when she was admitted with a GI bleed and demand ischemia and suffered a mechanical fall in the hospital with fracture of her left fibula. She then reported progressive exertional shortness of breath and fatigue and was diagnosed with severe AS. During her work-up for aortic stenosis and consideration of TAVR she underwent cardiac catheterization on 09/08/2020 which showed a 90% left circumflex stenosis and a large caliber vessel.  This was successfully treated with drug-eluting stent, which led to an improvement in symptoms. Catheterization also showed a 60 to 70% distal RCA stenosis and mild nonobstructive LAD disease.  The mean gradient across aortic valve at catheterization was 57 mmHg with a valve area of 0.78 cm2.  She was placed on plavix monotherapy given recent GI bleed.      She was evaluated by the multidisciplinary valve team and underwent successful TAVR with a 23 mm Medtronic Evolut Pro+ THV via the TF approach on 09/28/20. Post operative echo  showed normally functioning TAVR with a mean gradient of 11 mm hg and no PVL. Shortly after TAVR she developed acute right limb ischemia that required emergent exploration and primary repair right femoral artery. She was discharged on POD 2 on monotherapy with plavix. 1 month echo showed EF 70%, normally functioning TAVR with a mean gradient of 8.3 and no PVL.     She was admitted in 11/2020 for acute GI bleed requiring transfusion. She underwent extensive GI work up and Plavix was held but ultimately restarted.     She was readmitted in 06/2021 for recurrent weak spells, near syncope, observed to have intermittent CHB with pauses. She underwent successful implantation of a Biotronik dual chamber PPM on 07/12/21 by Dr Quentin Ore. Last device check with no arrhthymias and normal function.      Today she presents to clinic for follow up with her daughter. She continues to do extremely well. She lives alone although her daughter is close by to check on her. She remains very active at baseline. She denies chest pain, palpitations, LE edema, orthopnea, dizziness, or syncope. Echocardiogram today shows an LVEF at 60 to 65% with G1DD with a 23 mm CoreValve-Evolut Pro prosthetic (TAVR) valve present in the aortic position.  Past Medical History:  Diagnosis Date   Anxiety    Carotid stenosis, right 10/16/2019  75% on CTA neck   Cholelithiasis    on u/s 09/2011   Degenerative joint disease    Right THA in 1995; cervical discectomy and fusion-2001   Diabetes mellitus, type 2 (HCC)    + neuropathy   Emphysema    Gastroesophageal reflux disease    Hiatal hernia    History of CVA (cerebrovascular accident)    Hyperlipidemia    Hypertension    IDA (iron deficiency anemia)    labs 09/2011   Mild cognitive impairment    Obesity    Osteoporosis    Pneumonia    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   s/p TAVR with a 30m Medtronic Evolut Pro+ via the TF approach    Severe aortic  stenosis    Sigmoid diverticulosis    Small bowel lesion    On Given's capsule; Dr GKathleene Hazel11/2013, no further w/u needed    Past Surgical History:  Procedure Laterality Date   ANTERIOR CERVICAL DISCECTOMY  2001   C4-5, allograft, fixation   BREAST LUMPECTOMY     benign   CATARACT EXTRACTION W/ INTRAOCULAR LENS IMPLANT  2007   Left   COLONOSCOPY  2006   COLONOSCOPY  04/03/2012   Dr. ROk Edwardsdiverticulosis, negative microscopic  colitis    COLONOSCOPY WITH PROPOFOL N/A 11/27/2020   Surgeon: CHarvel Quale MD;  diverticulosis, no active bleeding.  Two 2-4 mm polyps not removed in the setting of Plavix.   CORONARY STENT INTERVENTION N/A 09/08/2020   Procedure: CORONARY STENT INTERVENTION;  Surgeon: CSherren Mocha MD;  [facility];  Service: Cardiovascular;  Laterality: N/A;   ENDARTERECTOMY Right 10/21/2019   Procedure: ENDARTERECTOMY CAROTID RIGHT;  Surgeon: CWaynetta Sandy MD;  [place] [facility]  Service: Vascular;  Laterality: Right;   ESOPHAGOGASTRODUODENOSCOPY  04/03/2012   Dr. RJennet Madurohernia, chronic gastritis on bx   ESOPHAGOGASTRODUODENOSCOPY (EGD) WITH PROPOFOL N/A 04/28/2020   Procedure: ESOPHAGOGASTRODUODENOSCOPY (EGD) WITH PROPOFOL;  Surgeon: BOtis Brace MD;  [facility] [facility]  Service: Gastroenterology;  Laterality: N/A;   ESOPHAGOGASTRODUODENOSCOPY (EGD) WITH PROPOFOL N/A 11/27/2020   Surgeon: CMontez Morita DQuillian Quince MD; findings suggestive of short segment Barrett's, normal stomach, and small bowel.   FEMORAL ARTERY EXPLORATION Right 09/28/2020   Procedure: RIGHT GROIN EXPLORATION WITH REPAIR RIGHT COMMON FEMORAL ARTERY;  Surgeon: ERosetta Posner MD;  [facility];  Service: Vascular;  Laterality: Right;   GIVENS CAPSULE STUDY  05/09/2012   RMR: an unclear raised area of small bowel was noted, with features almost  characteristic of very small polyp. This was without villous  blunting or any  evidence of active bleeding; yet, the area of  concern appeared to be erythematous. However, this could simply  be a light reflection on a normal variation of the small bowel. REFERRED TO DR. GILLIAM, APPT FOR NOVEMBER 2013.    GIVENS CAPSULE STUDY N/A 04/30/2020   Procedure: GIVENS CAPSULE STUDY;  Surgeon: BOtis Brace MD;  [place] [facility]  Service: Gastroenterology;  Laterality: N/A;   [place]  11/[place]/2020    Surgeon: CMontez Morita DQuillian Quince MD;  questionable nodule/small mass in the distal small bowel but without ulceration.  Evidence of hematochezia throughout the colon but no fresh blood.    HIP ARTHROPLASTY Right    INSERT / REPLACE / REMOVE PACEMAKER     PACEMAKER IMPLANT N/A 07/12/2021   Procedure: PACEMAKER IMPLANT;  Surgeon: LVickie Epley MD;  [facility];  Service: Cardiovascular;  Laterality: N/A;   PATCH ANGIOPLASTY Right 10/21/2019   Procedure: PATCH ANGIOPLASTY USING Rueben Bash BIOLOGIC PATCH;  Surgeon: Waynetta Sandy, MD;  Location: [place];  Service: Vascular;  Laterality: Right;   POLYPECTOMY  04/28/2020   Procedure: POLYPECTOMY;  Surgeon: Otis Brace, MD;  Location: [facility];  Service: Gastroenterology;;   RIGHT/LEFT HEART CATH AND CORONARY ANGIOGRAPHY N/A 09/08/2020   Procedure: RIGHT/LEFT HEART CATH AND CORONARY ANGIOGRAPHY;  Surgeon: Sherren Mocha, MD;  Location: [place] [facility];  Service: Cardiovascular;  Laterality: N/A;   TEE WITHOUT CARDIOVERSION N/A 09/28/2020   Procedure: TRANSESOPHAGEAL ECHOCARDIOGRAM (TEE);  Surgeon: Sherren Mocha, MD;  Location: [place];  Service: Open Heart Surgery;  Laterality: N/A;   TONSILLECTOMY     TOTAL HIP ARTHROPLASTY  1995   Right   TRANSCATHETER AORTIC VALVE REPLACEMENT, TRANSFEMORAL N/A 09/28/2020   Procedure: TRANSCATHETER AORTIC VALVE REPLACEMENT, TRANSFEMORAL;  Surgeon: Sherren Mocha, MD;  Location: [place];  Service: Open Heart Surgery;  Laterality: N/A;     Current Medications: Current Meds  Medication Sig   acetaminophen (TYLENOL) 500 MG tablet Take 500 mg by mouth every 8 (eight) hours as needed for mild pain.   ALPRAZolam (XANAX) 0.5 MG tablet Take 0.5 mg by mouth at bedtime.   clopidogrel (PLAVIX) 75 MG tablet Take 1 tablet (75 mg total) by mouth daily with breakfast.   ferrous sulfate 325 (65 FE) MG tablet Take 1 tablet (325 mg total) by mouth daily.   glimepiride (AMARYL) 1 MG tablet Take 1 mg by mouth daily with breakfast.   Glycerin-Hypromellose-PEG 400 (VISINE DRY EYE OP) Place 1 drop into both eyes daily as needed (for dry eye relief).   meclizine (ANTIVERT) 12.5 MG tablet Take 1 tablet (12.5 mg total) by mouth 3 (three) times daily as needed for dizziness.   metoprolol tartrate (LOPRESSOR) 25 MG tablet Take 0.5 tablets (12.5 mg total) by mouth 2 (two) times daily.   Multiple Vitamins-Minerals (PRESERVISION AREDS 2 PO) Take 1 tablet by mouth daily. Takes 1 tablet in the morning and 1 tablet at night for eyes.   pantoprazole (PROTONIX) 40 MG tablet TAKE ONE TABLET (40 MG TOTAL) BY MOUTH ONCE DAILY.   vitamin C (ASCORBIC ACID) 500 MG tablet Take 500 mg by mouth daily.     Allergies:   Patient has no known allergies.   Social History   Socioeconomic History   Marital status: Divorced    Spouse name: Not on file   Number of children: 4   Years of education: 6th grade   Highest education level: Not on file  Occupational History   Occupation: Retired    Comment: [employer]  Tobacco Use   Smoking status: Never   Smokeless tobacco: Never   Tobacco comments:    Quit x 50 years  Vaping Use   Vaping Use: Never used  Substance and Sexual Activity   Alcohol use: Never   Drug use: Never   Sexual activity: Not Currently  Other Topics Concern   Not on file  Social History Narrative   Lives alone. Every other week she has family with her. Daughter and son take care of her during the days.    Right-handed.   No daily use of caffeine.              Social Determinants of Health   Financial Resource Strain: Not on file  Food Insecurity: Not on file  Transportation Needs: Not on file  Physical Activity: Not on file  Stress:  Not on file  Social Connections: Not on file     Family History: The patient's family history includes CAD in her father; Cancer in her brother; Cirrhosis in her mother; Diabetes in her mother; Heart failure in her father; Hypertension in her father; Uterine cancer in her sister. There is no history of Colon cancer.  ROS:   Please see the history of present illness.    All other systems reviewed and are negative.  EKGs/Labs/Other Studies Reviewed:    The following studies were reviewed today:  Echocardiogram 11/18/21:   1. Compared to 07/12/21.   2. Left ventricular ejection fraction, by estimation, is 60 to 65%. The  left ventricle has normal function. The left ventricle has no regional  wall motion abnormalities. Left ventricular diastolic parameters are  consistent with Grade I diastolic  dysfunction (impaired relaxation). Elevated left atrial pressure.   3. Right ventricular systolic function is normal. The right ventricular  size is normal.   4. Left atrial size was mildly dilated.   5. The mitral valve is normal in structure. Mild mitral valve  regurgitation. Moderate mitral stenosis. Moderate mitral annular  calcification.   6. The aortic valve has been repaired/replaced. Aortic valve  regurgitation is not visualized. No aortic stenosis is present. There is a  23 mm CoreValve-Evolut Pro prosthetic (TAVR) valve present in the aortic  position. Procedure Date: 09/28/20. Echo  findings are consistent with normal structure and function of the aortic  valve prosthesis.   7. The inferior vena cava is normal in size with greater than 50%  respiratory variability, suggesting right atrial pressure of 3 mmHg.   Operateive NOTE     Date of Procedure:                 09/28/2020    Preoperative Diagnosis:      Severe Aortic Stenosis    Postoperative Diagnosis:    Same    Procedure:        Transcatheter Aortic Valve Replacement - Percutaneous Right Transfemoral Approach             Medtronic Evolut-PRO+  (size 23 mm, model # EVPROPLUS-23US, serial # [id number])              Co-Surgeons:            Gaye Pollack, MD and Sherren Mocha, MD     Anesthesiologist:                  Jenita Seashore, MD   Echocardiographer:              Liane Comber, MD   Pre-operative Echo Findings: Severe aortic stenosis   Normal left ventricular systolic function   Post-operative Echo Findings: No paravalvular leak Normal left ventricular systolic function Normal mean prosthetic transvalvular gradient of 16 mm Hg.   ________________________   Echo 09/29/20:  IMPRESSIONS   1. The mitral valve is degenerative. Mild mitral valve regurgitation.  Moderate mitral stenosis. Severe mitral annular calcification.   2. Post TAVR with 23 mm supra annular Medtronic Evolut valve No  significant PVL mean gradient 11 peak 19 mmHg lower than implant DVI 0.69  and AVA 2.1 cm2. The aortic valve has been repaired/replaced.   3. There is severely elevated pulmonary artery systolic pressure.   4. The inferior vena cava is dilated in size with >50% respiratory  variability, suggesting right atrial pressure of 8 mmHg.  ____________________   Echo 10/25/20 IMPRESSIONS   1. Left  ventricular ejection fraction, by estimation, is 70 to 75%. The  left ventricle has hyperdynamic function. The left ventricle has no  regional wall motion abnormalities. There is mild left ventricular  hypertrophy. Left ventricular diastolic  parameters are consistent with Grade I diastolic dysfunction (impaired  relaxation). Elevated left atrial pressure.   2. Right ventricular systolic function is normal. The right ventricular  size is normal. There is mildly elevated pulmonary artery systolic  pressure.   3. Left atrial size  was mildly dilated.   4. Right atrial size was mildly dilated.   5. The mitral valve is abnormal. No evidence of mitral valve  regurgitation. Mild mitral stenosis.   6. Medtronic Evolut-PRO+ size 23 mm valve is in the AV position. The  prosthetic valve has normal function. . The aortic valve has been  repaired/replaced. Aortic valve regurgitation is not visualized. No aortic  stenosis is present.   7. The inferior vena cava is normal in size with greater than 50%  respiratory variability, suggesting right atrial pressure of 3 mmHg.   EKG:  EKG is  ordered today.    Recent Labs: 07/11/2021: Magnesium 2.1 07/12/2021: TSH 4.634 10/26/2021: ALT 22; B Natriuretic Peptide 116.0; BUN 22; Creatinine, Ser 1.02; Hemoglobin 12.4; Platelets 220; Potassium 4.5; Sodium 135   Recent Lipid Panel    Component Value Date/Time   CHOL 100 04/08/2020 0441   TRIG 83 04/08/2020 0441   HDL 35 (L) 04/08/2020 0441   CHOLHDL 2.9 04/08/2020 0441   VLDL 17 04/08/2020 0441   LDLCALC 48 04/08/2020 0441   Physical Exam:    VS:  BP 120/70    Pulse 66    Ht 5' 5"  (1.651 m)    Wt 172 lb 13.9 oz (78.4 kg)    BMI 28.77 kg/m     Wt Readings from Last 3 Encounters:  11/18/21 172 lb 13.9 oz (78.4 kg)  10/14/21 170 lb (77.1 kg)  08/25/21 165 lb 8 oz (75.1 kg)    General: Elderly, NAD Neck: Negative for carotid bruits. No JVD Lungs:Clear to ausculation bilaterally. Breathing is unlabored. Cardiovascular: RRR with S1 S2. No murmurs Extremities: No edema.  Neuro: Alert and oriented. No focal deficits. No facial asymmetry. MAE spontaneously. Psych: Responds to questions appropriately with normal affect.    ASSESSMENT/PLAN:    Severe AS s/p TAVR: Doing very well with no complaints with NYHA Class I symptoms. Continue Plavix, previously deferred transition to ASA therefore will continue monotherapy with Plavix. No issues with bleeding. Echocardiogram today with LVEF at 60 to 65% with G1DD with a 23 mm  CoreValve-Evolut Pro prosthetic (TAVR) valve present in the aortic position. She has follow up with Dr. Johnsie Cancel 4/17.    CAD: pre TAVR cath showed severe 90% left circumflex stenosis (large caliber vessel) with successful PCI using a 3.5 x 12 mm resolute Onyx DES. There was moderate to severe distal RCA stenosis of 60 to 70% and mild nonobstructive LAD stenosis. She tolerated 6 months of Plavix therapy and was offered to transition off to ASA only however patient deferred. She has no bleeding concerns today.     Hx of GI bleeding with anemia: No recent recurrence. Last Hb 10/26/21 at 12.4.   Paroxsymal AV block: s/p PPM placement with Biotronik dual chamber PPM on 07/12/21 by Dr Quentin Ore. Last interrogation with normal device functioning.    Medication Adjustments/Labs and Tests Ordered: Current medicines are reviewed at length with the patient today.  Concerns regarding medicines are outlined above.  No orders of the defined types were placed in this encounter.  No orders of the defined types were placed in this encounter.   Patient Instructions  Medication Instructions:  Your physician recommends that you continue on your current medications as directed. Please refer to the Current Medication list given to you today.  PLEASE CALL ME WITH THE MEDICATION BOTTLES SO WE CAN GET HER LIST CORRECT, THANK YOU!  *If you need a refill on your cardiac medications before your next appointment, please call your pharmacy*   Lab Work: None ordered  If you have labs (blood work) drawn today and your tests are completely normal, you will receive your results only by: [place] (if you have MyChart) OR A paper copy in the mail If you have any lab test that is abnormal or we need to change your treatment, we will call you to review the results.   Testing/Procedures: None    Follow-Up: At [facility], you and your health needs are our priority.  As part of our continuing mission to provide  you with exceptional heart care, we have created designated Provider Care Teams.  These Care Teams include your primary Cardiologist (physician) and Advanced Practice Providers (APPs -  Physician Assistants and Nurse Practitioners) who all work together to provide you with the care you need, when you need it.  We recommend signing up for the patient portal called "MyChart".  Sign up information is provided on this After Visit Summary.  MyChart is used to connect with patients for Virtual Visits (Telemedicine).  Patients are able to view lab/test results, encounter notes, upcoming appointments, etc.  Non-urgent messages can be sent to your provider as well.   To learn more about what you can do with MyChart, go to [email or web address].    Your next appointment:   AS SCHEDULED  The format for your next appointment:   In Person  Provider:   Jenkins Rouge, MD    Other Instructions    Signed, Kathyrn Drown, NP  11/22/2021 8:43 AM    [place]

## 2021-11-18 ENCOUNTER — Other Ambulatory Visit: Payer: Self-pay

## 2021-11-18 ENCOUNTER — Ambulatory Visit (HOSPITAL_COMMUNITY): Payer: Medicare Other | Attending: Cardiology

## 2021-11-18 ENCOUNTER — Ambulatory Visit (INDEPENDENT_AMBULATORY_CARE_PROVIDER_SITE_OTHER): Payer: Medicare Other | Admitting: Cardiology

## 2021-11-18 ENCOUNTER — Telehealth: Payer: Self-pay | Admitting: Cardiovascular Disease

## 2021-11-18 VITALS — BP 120/70 | HR 66 | Ht 65.0 in | Wt 172.9 lb

## 2021-11-18 DIAGNOSIS — Z8719 Personal history of other diseases of the digestive system: Secondary | ICD-10-CM

## 2021-11-18 DIAGNOSIS — Z952 Presence of prosthetic heart valve: Secondary | ICD-10-CM

## 2021-11-18 DIAGNOSIS — I25810 Atherosclerosis of coronary artery bypass graft(s) without angina pectoris: Secondary | ICD-10-CM | POA: Diagnosis not present

## 2021-11-18 DIAGNOSIS — I442 Atrioventricular block, complete: Secondary | ICD-10-CM

## 2021-11-18 DIAGNOSIS — I350 Nonrheumatic aortic (valve) stenosis: Secondary | ICD-10-CM

## 2021-11-18 LAB — ECHOCARDIOGRAM COMPLETE
AV Mean grad: 8.6 mmHg
AV Peak grad: 15.1 mmHg
Ao pk vel: 1.94 m/s
Area-P 1/2: 2.39 cm2
S' Lateral: 2.8 cm

## 2021-11-18 NOTE — Patient Instructions (Signed)
Medication Instructions:  ?Your physician recommends that you continue on your current medications as directed. Please refer to the Current Medication list given to you today.  PLEASE CALL ME WITH THE MEDICATION BOTTLES SO WE CAN GET HER LIST CORRECT, THANK YOU! ? ?*If you need a refill on your cardiac medications before your next appointment, please call your pharmacy* ? ? ?Lab Work: ?None ordered ? ?If you have labs (blood work) drawn today and your tests are completely normal, you will receive your results only by: ?MyChart Message (if you have MyChart) OR ?A paper copy in the mail ?If you have any lab test that is abnormal or we need to change your treatment, we will call you to review the results. ? ? ?Testing/Procedures: ?None  ? ? ?Follow-Up: ?At [facility], you and your health needs are our priority.  As part of our continuing mission to provide you with exceptional heart care, we have created designated Provider Care Teams.  These Care Teams include your primary Cardiologist (physician) and Advanced Practice Providers (APPs -  Physician Assistants and Nurse Practitioners) who all work together to provide you with the care you need, when you need it. ? ?We recommend signing up for the patient portal called "MyChart".  Sign up information is provided on this After Visit Summary.  MyChart is used to connect with patients for Virtual Visits (Telemedicine).  Patients are able to view lab/test results, encounter notes, upcoming appointments, etc.  Non-urgent messages can be sent to your provider as well.   ?To learn more about what you can do with MyChart, go to [email or web address].   ? ?Your next appointment:   ?AS SCHEDULED ? ?The format for your next appointment:   ?In Person ? ?Provider:   ?Jenkins Rouge, MD  ? ? ?Other Instructions ? ?

## 2021-11-18 NOTE — Telephone Encounter (Signed)
Returned the call and med list has been adjusted according to medications taken.  ?

## 2021-11-18 NOTE — Telephone Encounter (Signed)
? ?  Pt c/o medication issue: ? ?1. Name of Medication: Lipitor, LUTEIN PO  ? ?2. How are you currently taking this medication (dosage and times per day)?  ? ?3. Are you having a reaction (difficulty breathing--STAT)?  ? ?4. What is your medication issue? Pt's daughter calling, she has questions about this meds, she is asking if Rico Junker can call her back ? ?

## 2021-11-28 ENCOUNTER — Other Ambulatory Visit: Payer: Self-pay | Admitting: Cardiovascular Disease

## 2021-12-05 NOTE — Progress Notes (Signed)
? ? ?Referring Provider: [name], MD ?Primary Care Physician:  [name], MD ?Primary GI Physician: Dr. Gala Romney ? ?Chief Complaint  ?Patient presents with  ? Follow-up  ?  Constipation sometimes  ? ? ?HPI:   ?Denise Parrish is a 88 y.o. female with medical history of diabetes, COPD, CVA, mild cognitive impairment, HTN, carotid artery disease, severe aortic stenosis s/p TAVR, coronary artery disease s/p stent placement in December 2021 on Plavix, paroxysmal AV block s/p dual-chamber pacemaker, GERD, prior history of gastrointestinal bleeding in 2013, hospitalized in March 2022 with symptomatic anemia and melena s/p  EGD, colonoscopy, and capsule study.  EGD with findings suggestive of short segment Barrett's, normal stomach, and small bowel.  Colonoscopy with diverticulosis, but no active bleeding.  Two 2-4 mm polyps were not removed as she was on Plavix.  Capsule study was completed with questionable nodule/small mass in the distal small bowel but without ulceration.  Evidence of hematochezia throughout the colon but no fresh blood.  Queried Dieulafoy lesion as culprit for obscure GI bleeding vs small bowel nodule. CTA completed without source for bleeding. There was consideration of repeat colonoscopy to determine if small bowel nodule could be reached. Additionally, if persistent clinical bleeding, would need to consider retrograde double-balloon enteroscopy.  However, as hemoglobin remained stable and was improving, no additional procedure were pursued and patient has opted to forgo further evaluation.  She is presenting today for routine follow-up. ? ?Last seen in our office 06/08/2021.  She did not complete blood work that was ordered at her last visit, but stated she was going to complete this after her office visit.  She denied overt GI bleeding, or any other obvious blood loss.  No significant GI symptoms.  GERD well controlled on Protonix 40 mg daily.  Denied NSAIDs. Remained on Plavix.  She had stopped iron in May. Also noted mildly elevated LFT when she had COVID in September 2022. Recommended continuing Protonix daily, repeat CBC and iron panel, repeat HFP, follow-up in 6 months.  ? ?Hemoglobin 10.3, iron 36, saturation 10%, ferritin 23. LFTs within normal limits. Recommended resuming iron daily, repeat labs in 4 weeks.  ? ?10/21: Hemoglobin 11.5, iron panel wnl. Recommended continue iron daily and repeat labs in 3 months.  ? ?09/22/21: Hemoglobin 12.8, iron panel wnl.  ? ? ?Today:  ? ?GERD with Barrett's Esophagus:  ?Well controlled on Protonix 40 mg daily. No nausea, vomiting, or dysphagia.  ? ?Constipation:  ?Occasional. Uses stool softeners as needed, about once a week.  ? ?Anemia:  ?Admits to dark stool with the iron. No bright red blood per rectum or any other obvious blood loss such as hematuria, vaginal bleeding, or nosebleeds. Taking iron once daily.  ? ?Avoiding NSAIDs. ? ?Past Medical History:  ?Diagnosis Date  ? Anxiety   ? Carotid stenosis, right 10/16/2019  ? 75% on CTA neck  ? Cholelithiasis   ? on u/s 09/2011  ? Degenerative joint disease   ? Right THA in 1995; cervical discectomy and fusion-2001  ? Diabetes mellitus, type 2 (HCC)   ? + neuropathy  ? Emphysema   ? Gastroesophageal reflux disease   ? Hiatal hernia   ? History of CVA (cerebrovascular accident)   ? Hyperlipidemia   ? Hypertension   ? IDA (iron deficiency anemia)   ? labs 09/2011  ? Mild cognitive impairment   ? Obesity   ? Osteoporosis   ? Pneumonia   ? 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  ?  s/p TAVR with a 2m Medtronic Evolut Pro+ via the TF approach   ? Severe aortic stenosis   ? Sigmoid diverticulosis   ? Small bowel lesion   ? On Given's capsule; Dr GKathleene Hazel11/2013, no further w/u needed  ? ? ?Past Surgical History:  ?Procedure Laterality Date  ? ANTERIOR CERVICAL DISCECTOMY  2001  ? C4-5, allograft, fixation  ? BREAST LUMPECTOMY    ? benign  ? CATARACT EXTRACTION W/ INTRAOCULAR  LENS IMPLANT  2007  ? Left  ? COLONOSCOPY  2006  ? COLONOSCOPY  04/03/2012  ? Dr. ROk Edwardsdiverticulosis, negative microscopic  colitis   ? COLONOSCOPY WITH PROPOFOL N/A 11/27/2020  ? Surgeon: CMontez Morita DQuillian Quince MD;  diverticulosis, no active bleeding.  Two 2-4 mm polyps not removed in the setting of Plavix.  ? CORONARY STENT INTERVENTION N/A 09/08/2020  ? Procedure: CORONARY STENT INTERVENTION;  Surgeon: CSherren Mocha MD;  [facility];  Service: Cardiovascular;  Laterality: N/A;  ? ENDARTERECTOMY Right 10/21/2019  ? Procedure: ENDARTERECTOMY CAROTID RIGHT;  Surgeon: CWaynetta Sandy MD;  [place] [facility]  Service: Vascular;  Laterality: Right;  ? ESOPHAGOGASTRODUODENOSCOPY  04/03/2012  ? Dr. RJennet Madurohernia, chronic gastritis on bx  ? ESOPHAGOGASTRODUODENOSCOPY (EGD) WITH PROPOFOL N/A 04/28/2020  ? Procedure: ESOPHAGOGASTRODUODENOSCOPY (EGD) WITH PROPOFOL;  Surgeon: BOtis Brace MD;  [facility];  Service: Gastroenterology;  Laterality: N/A;  ? ESOPHAGOGASTRODUODENOSCOPY (EGD) WITH PROPOFOL N/A 11/27/2020  ? Surgeon: CMontez Morita DQuillian Quince MD; findings suggestive of short segment Barrett's, normal stomach, and small bowel.  ? FEMORAL ARTERY EXPLORATION Right 09/28/2020  ? Procedure: RIGHT GROIN EXPLORATION WITH REPAIR RIGHT COMMON FEMORAL ARTERY;  Surgeon: ERosetta Posner MD;  [place]  Service: Vascular;  Laterality: Right;  ? GIVENS CAPSULE STUDY  05/09/2012  ? RMR: an unclear raised area of small bowel was noted, with features almost  characteristic of very small polyp. This was without villous  blunting or any evidence of active bleeding; yet, the area of  concern appeared to be erythematous. However, this could simply  be a light reflection on a normal variation of the small bowel. REFERRED TO DR. GILLIAM, APPT FOR NOVEMBER 2013.   ? GIVENS CAPSULE STUDY N/A 04/30/2020  ? Procedure: GIVENS CAPSULE STUDY;  Surgeon: BOtis Brace  MD;  [place]  Service: Gastroenterology;  Laterality: N/A;  ? GIVENS CAPSULE STUDY  11/27/2020  ?  Surgeon: CMontez Morita DQuillian Quince MD;  questionable nodule/small mass in the distal small bowel but without ulceration.  Evidence of hematochezia throughout the colon but no fresh blood.   ? HIP ARTHROPLASTY Right   ? INSERT / REPLACE / REMOVE PACEMAKER    ? PACEMAKER IMPLANT N/A 07/12/2021  ? Procedure: PACEMAKER IMPLANT;  Surgeon: LVickie Epley MD;  [place] [facility];  Service: Cardiovascular;  Laterality: N/A;  ? PATCH ANGIOPLASTY Right 10/21/2019  ? Procedure: PATCH ANGIOPLASTY USING XRueben BashBIOLOGIC PATCH;  Surgeon: CWaynetta Sandy MD;  [place]  Service: Vascular;  Laterality: Right;  ? POLYPECTOMY  04/28/2020  ? Procedure: POLYPECTOMY;  Surgeon: BOtis Brace MD;  [facility];  Service: Gastroenterology;;  ? RIGHT/LEFT HEART CATH AND CORONARY ANGIOGRAPHY N/A 09/08/2020  ? Procedure: RIGHT/LEFT HEART CATH AND CORONARY ANGIOGRAPHY;  Surgeon: CSherren Mocha MD;  [place] [facility];  Service: Cardiovascular;  Laterality: N/A;  ? TEE WITHOUT CARDIOVERSION N/A 09/28/2020  ? Procedure: TRANSESOPHAGEAL ECHOCARDIOGRAM (TEE);  Surgeon: CSherren Mocha MD;  [place] [facility]  Service: Open Heart Surgery;  Laterality: N/A;  ? TONSILLECTOMY    ? TOTAL HIP ARTHROPLASTY  1995  ? Right  ? TRANSCATHETER AORTIC VALVE REPLACEMENT, TRANSFEMORAL N/A 09/28/2020  ? Procedure: TRANSCATHETER AORTIC VALVE REPLACEMENT, TRANSFEMORAL;  Surgeon: Sherren Mocha, MD;  Location: [place];  Service: Open Heart Surgery;  Laterality: N/A;  ? ? ?Current Outpatient Medications  ?Medication Sig Dispense Refill  ? acetaminophen (TYLENOL) 500 MG tablet Take 500 mg by mouth every 8 (eight) hours as needed for mild pain.    ? ALPRAZolam (XANAX) 0.5 MG tablet Take 0.5 mg by mouth at bedtime.    ? clopidogrel (PLAVIX) 75 MG tablet TAKE ONE TABLET (75 MG TOTAL) BY MOUTH DAILY  WITH BREAKFAST. 90 tablet 3  ? ferrous sulfate 325 (65 FE) MG tablet Take 1 tablet (325 mg total) by mouth daily. 30 tablet 3  ? glimepiride (AMARYL) 1 MG tablet Take 1 mg by mouth daily with breakfast.

## 2021-12-07 ENCOUNTER — Ambulatory Visit (INDEPENDENT_AMBULATORY_CARE_PROVIDER_SITE_OTHER): Payer: Medicare Other | Admitting: Gastroenterology

## 2021-12-07 ENCOUNTER — Encounter: Payer: Self-pay | Admitting: Gastroenterology

## 2021-12-07 ENCOUNTER — Other Ambulatory Visit: Payer: Self-pay

## 2021-12-07 VITALS — BP 126/72 | HR 84 | Temp 97.9°F | Ht 66.0 in | Wt 171.6 lb

## 2021-12-07 DIAGNOSIS — K5900 Constipation, unspecified: Secondary | ICD-10-CM | POA: Diagnosis not present

## 2021-12-07 DIAGNOSIS — D509 Iron deficiency anemia, unspecified: Secondary | ICD-10-CM

## 2021-12-07 DIAGNOSIS — K219 Gastro-esophageal reflux disease without esophagitis: Secondary | ICD-10-CM

## 2021-12-07 NOTE — Patient Instructions (Addendum)
Please have blood work completed next week with a week thereafter at [facility] ? ?Continue taking iron daily. ? ?Continue Protonix 40 mg daily 30 minutes before breakfast for reflux. ? ?Continue to avoid all NSAID products including ibuprofen, Aleve, Advil, BC powders, Goody powders, and anything that says "NSAID" on the package. ? ?We will follow-up with you in 6 months.  Do not hesitate to call if you have questions or concerns prior to next visit. ? ?It was great to see you again today!  I am glad you are doing well overall! ? ?Aliene Altes, PA-C ?[facility] ? ?

## 2021-12-25 NOTE — Progress Notes (Signed)
?[facility]   ? ?                                    ?Cardiology Office Note:   ? ?Date:  01/02/2022  ? ?ID:  WALTER MIN, DOB [birth year], MRN [id number] ? ?PCP:  [name], MD  ?[facility] Cardiologist:  Jenkins Rouge, MD /Dr. Burt Knack & Dr. Cyndia Bent (TAVR) ?[facility] Electrophysiologist:  Vickie Epley, MD  ? ? ? ?History of Present Illness:   ? ?Denise Parrish is a 88 y.o. female with a hx of mild dementia, DMT2, GERD, HLD, HTN, obesity, emphysema, hiatal hernia, history of TIA/CVA, carotid artery disease s/p right CEA 10/2019, history of GI bleeding, severe AS s/p TAVR (09/28/20) and symptomatic CHB s/p PPM (06/2021) who presents to clinic for follow up.  ?   ?She had a difficult hospitalization in August 2021 when she was admitted with a GI bleed and demand ischemia and suffered a mechanical fall in the hospital with fracture of her left fibula. She then reported progressive exertional shortness of breath and fatigue and was diagnosed with severe AS. During her work-up for aortic stenosis and consideration of TAVR she underwent cardiac catheterization on 09/08/2020 which showed a 90% left circumflex stenosis and a large caliber vessel.  This was successfully treated with drug-eluting stent, which led to an improvement in symptoms. Catheterization also showed a 60 to 70% distal RCA stenosis and mild nonobstructive LAD disease.  The mean gradient across aortic valve at catheterization was 57 mmHg with a valve area of 0.78 cm2.  She was placed on plavix monotherapy given recent GI bleed.   ?   ?She was evaluated by the multidisciplinary valve team and underwent successful TAVR with a 23 mm Medtronic Evolut Pro+ THV via the TF approach on 09/28/20. Post operative echo showed normally functioning TAVR with a mean gradient of 11 mm hg and no PVL. Shortly after TAVR she developed acute right limb ischemia that required emergent exploration and primary repair right femoral artery.  She was discharged on POD 2 on monotherapy with plavix. 1 month echo showed EF 70%, normally functioning TAVR with a mean gradient of 8.3 and no PVL.  ?   ?She was admitted in 11/2020 for acute GI bleed requiring transfusion. She underwent extensive GI work up and Plavix was held but ultimately restarted.  ?   ?She was readmitted in 06/2021 for recurrent weak spells, near syncope, observed to have intermittent CHB with pauses. She underwent successful implantation of a Biotronik dual chamber PPM on 07/12/21 by Dr Quentin Ore. Last device check with no arrhthymias and normal function.   ?   ?She lives alone although her daughter is close by to check on her. She remains very active at baseline. She denies chest pain, palpitations, LE edema, orthopnea, dizziness, or syncope.  ? ?Echocardiogram 11/18/21  shows an LVEF at 60 to 65% with G1DD with a 23 mm CoreValve-Evolut Pro prosthetic (TAVR) valve present in the aortic position. Mean gradient 8.6 mmhg no PVL ? ?She is more active Daughter takes her to two [employer] games/week and she has [employer] package at home to watch baseball games  ?Past Medical History:  ?Diagnosis Date  ? Anxiety   ? Carotid stenosis, right 10/16/2019  ? 75% on CTA neck  ? Cholelithiasis   ? on u/s 09/2011  ? Degenerative joint disease   ?  Right THA in 1995; cervical discectomy and fusion-2001  ? Diabetes mellitus, type 2 (HCC)   ? + neuropathy  ? Emphysema   ? Gastroesophageal reflux disease   ? Hiatal hernia   ? History of CVA (cerebrovascular accident)   ? Hyperlipidemia   ? Hypertension   ? IDA (iron deficiency anemia)   ? labs 09/2011  ? Mild cognitive impairment   ? Obesity   ? Osteoporosis   ? Pneumonia   ? 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  ? s/p TAVR with a 70m Medtronic Evolut Pro+ via the TF approach   ? Severe aortic stenosis   ? Sigmoid diverticulosis   ? Small bowel lesion   ? On Given's capsule; Dr GKathleene Hazel11/2013, no further w/u needed  ? ? ?Past Surgical  History:  ?Procedure Laterality Date  ? ANTERIOR CERVICAL DISCECTOMY  2001  ? C4-5, allograft, fixation  ? BREAST LUMPECTOMY    ? benign  ? CATARACT EXTRACTION W/ INTRAOCULAR LENS IMPLANT  2007  ? Left  ? COLONOSCOPY  2006  ? COLONOSCOPY  04/03/2012  ? Dr. ROk Edwardsdiverticulosis, negative microscopic  colitis   ? COLONOSCOPY WITH PROPOFOL N/A 11/27/2020  ? Surgeon: CMontez Morita DQuillian Quince MD;  diverticulosis, no active bleeding.  Two 2-4 mm polyps not removed in the setting of Plavix.  ? CORONARY STENT INTERVENTION N/A 09/08/2020  ? Procedure: CORONARY STENT INTERVENTION;  Surgeon: CSherren Mocha MD;  [facility];  Service: Cardiovascular;  Laterality: N/A;  ? ENDARTERECTOMY Right 10/21/2019  ? Procedure: ENDARTERECTOMY CAROTID RIGHT;  Surgeon: CWaynetta Sandy MD;  [place] [facility]  Service: Vascular;  Laterality: Right;  ? ESOPHAGOGASTRODUODENOSCOPY  04/03/2012  ? Dr. RJennet Madurohernia, chronic gastritis on bx  ? ESOPHAGOGASTRODUODENOSCOPY (EGD) WITH PROPOFOL N/A 04/28/2020  ? Procedure: ESOPHAGOGASTRODUODENOSCOPY (EGD) WITH PROPOFOL;  Surgeon: BOtis Brace MD;  [facility];  Service: Gastroenterology;  Laterality: N/A;  ? ESOPHAGOGASTRODUODENOSCOPY (EGD) WITH PROPOFOL N/A 11/27/2020  ? Surgeon: CMontez Morita DQuillian Quince MD; findings suggestive of short segment Barrett's, normal stomach, and small bowel.  ? FEMORAL ARTERY EXPLORATION Right 09/28/2020  ? Procedure: RIGHT GROIN EXPLORATION WITH REPAIR RIGHT COMMON FEMORAL ARTERY;  Surgeon: ERosetta Posner MD;  [place]  Service: Vascular;  Laterality: Right;  ? GIVENS CAPSULE STUDY  05/09/2012  ? RMR: an unclear raised area of small bowel was noted, with features almost  characteristic of very small polyp. This was without villous  blunting or any evidence of active bleeding; yet, the area of  concern appeared to be erythematous. However, this could simply  be a light reflection on a normal variation  of the small bowel. REFERRED TO DR. GILLIAM, APPT FOR NOVEMBER 2013.   ? GIVENS CAPSULE STUDY N/A 04/30/2020  ? Procedure: GIVENS CAPSULE STUDY;  Surgeon: BOtis Brace MD;  [place]  Service: Gastroenterology;  Laterality: N/A;  ? GIVENS CAPSULE STUDY  11/27/2020  ?  Surgeon: CMontez Morita DQuillian Quince MD;  questionable nodule/small mass in the distal small bowel but without ulceration.  Evidence of hematochezia throughout the colon but no fresh blood.   ? HIP ARTHROPLASTY Right   ? INSERT / REPLACE / REMOVE PACEMAKER    ? PACEMAKER IMPLANT N/A 07/12/2021  ? Procedure: PACEMAKER IMPLANT;  Surgeon: LVickie Epley MD;  [facility];  Service: Cardiovascular;  Laterality: N/A;  ? PATCH ANGIOPLASTY Right 10/21/2019  ? Procedure: PATCH ANGIOPLASTY USING XRueben BashBIOLOGIC PATCH;  Surgeon: CServando Snare  Harrell Gave, MD;  Location: [place];  Service: Vascular;  Laterality: Right;  ? POLYPECTOMY  04/28/2020  ? Procedure: POLYPECTOMY;  Surgeon: Otis Brace, MD;  Location: [place] [facility];  Service: Gastroenterology;;  ? RIGHT/LEFT HEART CATH AND CORONARY ANGIOGRAPHY N/A 09/08/2020  ? Procedure: RIGHT/LEFT HEART CATH AND CORONARY ANGIOGRAPHY;  Surgeon: Sherren Mocha, MD;  [facility] [place] [facility];  Service: Cardiovascular;  Laterality: N/A;  ? TEE WITHOUT CARDIOVERSION N/A 09/28/2020  ? Procedure: TRANSESOPHAGEAL ECHOCARDIOGRAM (TEE);  Surgeon: Sherren Mocha, MD;  Location: [place];  Service: Open Heart Surgery;  Laterality: N/A;  ? TONSILLECTOMY    ? TOTAL HIP ARTHROPLASTY  1995  ? Right  ? TRANSCATHETER AORTIC VALVE REPLACEMENT, TRANSFEMORAL N/A 09/28/2020  ? Procedure: TRANSCATHETER AORTIC VALVE REPLACEMENT, TRANSFEMORAL;  Surgeon: Sherren Mocha, MD;  Location: [place];  Service: Open Heart Surgery;  Laterality: N/A;  ? ? ?Current Medications: ?Current Meds  ?Medication Sig  ? acetaminophen (TYLENOL) 500 MG tablet Take 500 mg by mouth every 8 (eight) hours as needed for mild  pain.  ? ALPRAZolam (XANAX) 0.5 MG tablet Take 0.5 mg by mouth at bedtime.  ? clopidogrel (PLAVIX) 75 MG tablet TAKE ONE TABLET (75 MG TOTAL) BY MOUTH DAILY WITH BREAKFAST.  ? ferrous sulfate 325 (65 FE)

## 2021-12-29 LAB — CBC WITH DIFFERENTIAL/PLATELET
Absolute Monocytes: 488 cells/uL (ref 200–950)
Basophils Absolute: 18 cells/uL (ref 0–200)
Basophils Relative: 0.4 %
Eosinophils Absolute: 101 cells/uL (ref 15–500)
Eosinophils Relative: 2.2 %
HCT: 38.1 % (ref 35.0–45.0)
Hemoglobin: 12.2 g/dL (ref 11.7–15.5)
Lymphs Abs: 1716 cells/uL (ref 850–3900)
MCH: 31.2 pg (ref 27.0–33.0)
MCHC: 32 g/dL (ref 32.0–36.0)
MCV: 97.4 fL (ref 80.0–100.0)
MPV: 10 fL (ref 7.5–12.5)
Monocytes Relative: 10.6 %
Neutro Abs: 2277 cells/uL (ref 1500–7800)
Neutrophils Relative %: 49.5 %
Platelets: 223 10*3/uL (ref 140–400)
RBC: 3.91 10*6/uL (ref 3.80–5.10)
RDW: 13.1 % (ref 11.0–15.0)
Total Lymphocyte: 37.3 %
WBC: 4.6 10*3/uL (ref 3.8–10.8)

## 2021-12-29 LAB — IRON,TIBC AND FERRITIN PANEL
%SAT: 19 % (calc) (ref 16–45)
Ferritin: 47 ng/mL (ref 16–288)
Iron: 68 ug/dL (ref 45–160)
TIBC: 350 mcg/dL (calc) (ref 250–450)

## 2022-01-02 ENCOUNTER — Encounter: Payer: Self-pay | Admitting: Cardiovascular Disease

## 2022-01-02 ENCOUNTER — Ambulatory Visit (INDEPENDENT_AMBULATORY_CARE_PROVIDER_SITE_OTHER): Payer: Medicare Other | Admitting: Cardiovascular Disease

## 2022-01-02 VITALS — BP 122/70 | HR 64 | Ht 65.0 in | Wt 167.8 lb

## 2022-01-02 DIAGNOSIS — I480 Paroxysmal atrial fibrillation: Secondary | ICD-10-CM

## 2022-01-02 DIAGNOSIS — Z952 Presence of prosthetic heart valve: Secondary | ICD-10-CM | POA: Diagnosis not present

## 2022-01-02 DIAGNOSIS — Z950 Presence of cardiac pacemaker: Secondary | ICD-10-CM

## 2022-01-02 DIAGNOSIS — I25810 Atherosclerosis of coronary artery bypass graft(s) without angina pectoris: Secondary | ICD-10-CM

## 2022-01-02 DIAGNOSIS — I442 Atrioventricular block, complete: Secondary | ICD-10-CM | POA: Diagnosis not present

## 2022-01-02 NOTE — Patient Instructions (Signed)
Medication Instructions:  ?Your physician recommends that you continue on your current medications as directed. Please refer to the Current Medication list given to you today. ? ?*If you need a refill on your cardiac medications before your next appointment, please call your pharmacy* ? ? ?Lab Work: ?NONE  ? ?If you have labs (blood work) drawn today and your tests are completely normal, you will receive your results only by: ?MyChart Message (if you have MyChart) OR ?A paper copy in the mail ?If you have any lab test that is abnormal or we need to change your treatment, we will call you to review the results. ? ? ?Testing/Procedures: ?NONE  ? ? ?Follow-Up: ?At [facility], you and your health needs are our priority.  As part of our continuing mission to provide you with exceptional heart care, we have created designated Provider Care Teams.  These Care Teams include your primary Cardiologist (physician) and Advanced Practice Providers (APPs -  Physician Assistants and Nurse Practitioners) who all work together to provide you with the care you need, when you need it. ? ?We recommend signing up for the patient portal called "MyChart".  Sign up information is provided on this After Visit Summary.  MyChart is used to connect with patients for Virtual Visits (Telemedicine).  Patients are able to view lab/test results, encounter notes, upcoming appointments, etc.  Non-urgent messages can be sent to your provider as well.   ?To learn more about what you can do with MyChart, go to [email or web address].   ? ?Your next appointment:   ?1 year(s) ? ?The format for your next appointment:   ?In Person ? ?Provider:   ?Jenkins Rouge, MD  ? ? ?Other Instructions ?Thank you for choosing [facility]! ? ? ? ?Important Information About Sugar ? ? ? ? ? ? ?

## 2022-01-11 ENCOUNTER — Ambulatory Visit (INDEPENDENT_AMBULATORY_CARE_PROVIDER_SITE_OTHER): Payer: Medicare Other

## 2022-01-11 DIAGNOSIS — I442 Atrioventricular block, complete: Secondary | ICD-10-CM

## 2022-01-12 LAB — CUP PACEART REMOTE DEVICE CHECK
Battery Remaining Percentage: 95 %
Brady Statistic RA Percent Paced: 31 %
Brady Statistic RV Percent Paced: 100 %
Date Time Interrogation Session: 20230425090523
Implantable Lead Implant Date: 20221026
Implantable Lead Implant Date: 20221026
Implantable Lead Location: 753859
Implantable Lead Location: 753860
Implantable Lead Model: 377171
Implantable Lead Model: 377171
Implantable Lead Serial Number: 8000595070
Implantable Lead Serial Number: 8000606642
Implantable Pulse Generator Implant Date: 20221026
Lead Channel Impedance Value: 488 Ohm
Lead Channel Impedance Value: 546 Ohm
Lead Channel Pacing Threshold Amplitude: 0.7 V
Lead Channel Pacing Threshold Pulse Width: 0.3 ms
Lead Channel Sensing Intrinsic Amplitude: 15.3 mV
Lead Channel Sensing Intrinsic Amplitude: 2.6 mV
Lead Channel Setting Pacing Amplitude: 2 V
Lead Channel Setting Pacing Amplitude: 2.6 V
Lead Channel Setting Pacing Pulse Width: 0.4 ms
Pulse Gen Model: 407145
Pulse Gen Serial Number: 70223175

## 2022-01-26 NOTE — Progress Notes (Signed)
Remote pacemaker transmission.   

## 2022-02-07 ENCOUNTER — Other Ambulatory Visit: Payer: Self-pay | Admitting: Gastroenterology

## 2022-02-07 DIAGNOSIS — D509 Iron deficiency anemia, unspecified: Secondary | ICD-10-CM

## 2022-02-16 ENCOUNTER — Encounter: Payer: Self-pay | Admitting: Cardiology

## 2022-04-12 ENCOUNTER — Ambulatory Visit (INDEPENDENT_AMBULATORY_CARE_PROVIDER_SITE_OTHER): Payer: Medicare Other

## 2022-04-12 DIAGNOSIS — I442 Atrioventricular block, complete: Secondary | ICD-10-CM

## 2022-04-13 LAB — CUP PACEART REMOTE DEVICE CHECK
Battery Remaining Percentage: 90 %
Brady Statistic RA Percent Paced: 35 %
Brady Statistic RV Percent Paced: 100 %
Date Time Interrogation Session: 20230726133323
Implantable Lead Implant Date: 20221026
Implantable Lead Implant Date: 20221026
Implantable Lead Location: 753859
Implantable Lead Location: 753860
Implantable Lead Model: 377171
Implantable Lead Model: 377171
Implantable Lead Serial Number: 8000595070
Implantable Lead Serial Number: 8000606642
Implantable Pulse Generator Implant Date: 20221026
Lead Channel Impedance Value: 488 Ohm
Lead Channel Impedance Value: 585 Ohm
Lead Channel Pacing Threshold Amplitude: 0.7 V
Lead Channel Pacing Threshold Amplitude: 0.8 V
Lead Channel Pacing Threshold Pulse Width: 0.4 ms
Lead Channel Pacing Threshold Pulse Width: 0.4 ms
Lead Channel Sensing Intrinsic Amplitude: 13.3 mV
Lead Channel Sensing Intrinsic Amplitude: 3.1 mV
Lead Channel Setting Pacing Amplitude: 2 V
Lead Channel Setting Pacing Amplitude: 2.6 V
Lead Channel Setting Pacing Pulse Width: 0.4 ms
Pulse Gen Model: 407145
Pulse Gen Serial Number: 70223175

## 2022-05-08 NOTE — Progress Notes (Signed)
Remote pacemaker transmission.   

## 2022-06-05 ENCOUNTER — Encounter: Payer: Self-pay | Admitting: *Deleted

## 2022-06-14 ENCOUNTER — Other Ambulatory Visit: Payer: Self-pay

## 2022-06-14 ENCOUNTER — Inpatient Hospital Stay (HOSPITAL_COMMUNITY)
Admission: EM | Admit: 2022-06-14 | Discharge: 2022-06-18 | DRG: 445 | Disposition: A | Discharge status: Home / Self Care | Payer: Medicare Other | Attending: Family Medicine | Admitting: Family Medicine

## 2022-06-14 ENCOUNTER — Emergency Department (HOSPITAL_COMMUNITY): Payer: Medicare Other

## 2022-06-14 ENCOUNTER — Encounter (HOSPITAL_COMMUNITY): Payer: Self-pay | Admitting: *Deleted

## 2022-06-14 DIAGNOSIS — Z955 Presence of coronary angioplasty implant and graft: Secondary | ICD-10-CM

## 2022-06-14 DIAGNOSIS — K219 Gastro-esophageal reflux disease without esophagitis: Secondary | ICD-10-CM | POA: Diagnosis present

## 2022-06-14 DIAGNOSIS — I2510 Atherosclerotic heart disease of native coronary artery without angina pectoris: Secondary | ICD-10-CM | POA: Diagnosis present

## 2022-06-14 DIAGNOSIS — Z833 Family history of diabetes mellitus: Secondary | ICD-10-CM

## 2022-06-14 DIAGNOSIS — Z953 Presence of xenogenic heart valve: Secondary | ICD-10-CM | POA: Diagnosis not present

## 2022-06-14 DIAGNOSIS — E785 Hyperlipidemia, unspecified: Secondary | ICD-10-CM | POA: Diagnosis present

## 2022-06-14 DIAGNOSIS — E1165 Type 2 diabetes mellitus with hyperglycemia: Secondary | ICD-10-CM | POA: Diagnosis present

## 2022-06-14 DIAGNOSIS — Z8249 Family history of ischemic heart disease and other diseases of the circulatory system: Secondary | ICD-10-CM

## 2022-06-14 DIAGNOSIS — R748 Abnormal levels of other serum enzymes: Secondary | ICD-10-CM | POA: Diagnosis present

## 2022-06-14 DIAGNOSIS — Z950 Presence of cardiac pacemaker: Secondary | ICD-10-CM | POA: Diagnosis not present

## 2022-06-14 DIAGNOSIS — K810 Acute cholecystitis: Secondary | ICD-10-CM | POA: Diagnosis present

## 2022-06-14 DIAGNOSIS — Z96641 Presence of right artificial hip joint: Secondary | ICD-10-CM | POA: Diagnosis present

## 2022-06-14 DIAGNOSIS — Z981 Arthrodesis status: Secondary | ICD-10-CM

## 2022-06-14 DIAGNOSIS — D649 Anemia, unspecified: Secondary | ICD-10-CM | POA: Diagnosis present

## 2022-06-14 DIAGNOSIS — Z961 Presence of intraocular lens: Secondary | ICD-10-CM | POA: Diagnosis present

## 2022-06-14 DIAGNOSIS — J439 Emphysema, unspecified: Secondary | ICD-10-CM | POA: Diagnosis present

## 2022-06-14 DIAGNOSIS — Z9842 Cataract extraction status, left eye: Secondary | ICD-10-CM

## 2022-06-14 DIAGNOSIS — R7989 Other specified abnormal findings of blood chemistry: Secondary | ICD-10-CM | POA: Diagnosis present

## 2022-06-14 DIAGNOSIS — Z8673 Personal history of transient ischemic attack (TIA), and cerebral infarction without residual deficits: Secondary | ICD-10-CM

## 2022-06-14 DIAGNOSIS — F419 Anxiety disorder, unspecified: Secondary | ICD-10-CM | POA: Diagnosis present

## 2022-06-14 DIAGNOSIS — I10 Essential (primary) hypertension: Secondary | ICD-10-CM | POA: Diagnosis present

## 2022-06-14 DIAGNOSIS — Z79899 Other long term (current) drug therapy: Secondary | ICD-10-CM

## 2022-06-14 DIAGNOSIS — K8000 Calculus of gallbladder with acute cholecystitis without obstruction: Secondary | ICD-10-CM | POA: Diagnosis present

## 2022-06-14 DIAGNOSIS — E1142 Type 2 diabetes mellitus with diabetic polyneuropathy: Secondary | ICD-10-CM | POA: Diagnosis present

## 2022-06-14 DIAGNOSIS — K82A2 Perforation of gallbladder in cholecystitis: Secondary | ICD-10-CM | POA: Diagnosis present

## 2022-06-14 DIAGNOSIS — Z7902 Long term (current) use of antithrombotics/antiplatelets: Secondary | ICD-10-CM

## 2022-06-14 DIAGNOSIS — E119 Type 2 diabetes mellitus without complications: Secondary | ICD-10-CM

## 2022-06-14 LAB — COMPREHENSIVE METABOLIC PANEL
ALT: 114 U/L — ABNORMAL HIGH (ref 0–44)
AST: 74 U/L — ABNORMAL HIGH (ref 15–41)
Albumin: 2.7 g/dL — ABNORMAL LOW (ref 3.5–5.0)
Alkaline Phosphatase: 146 U/L — ABNORMAL HIGH (ref 38–126)
Anion gap: 10 (ref 5–15)
BUN: 19 mg/dL (ref 8–23)
CO2: 27 mmol/L (ref 22–32)
Calcium: 9 mg/dL (ref 8.9–10.3)
Chloride: 100 mmol/L (ref 98–111)
Creatinine, Ser: 0.83 mg/dL (ref 0.44–1.00)
GFR, Estimated: >60 mL/min (ref >60)
Glucose, Bld: 110 mg/dL — ABNORMAL HIGH (ref 70–99)
Potassium: 5.1 mmol/L (ref 3.5–5.1)
Sodium: 137 mmol/L (ref 135–145)
Total Bilirubin: 1.5 mg/dL — ABNORMAL HIGH (ref 0.3–1.2)
Total Protein: 6.8 g/dL (ref 6.5–8.1)

## 2022-06-14 LAB — CBC WITH DIFFERENTIAL/PLATELET
Abs Immature Granulocytes: 0.55 10*3/uL — ABNORMAL HIGH (ref 0.00–0.07)
Basophils Absolute: 0.1 10*3/uL (ref 0.0–0.1)
Basophils Relative: 1 %
Eosinophils Absolute: 0.1 10*3/uL (ref 0.0–0.5)
Eosinophils Relative: 1 %
HCT: 35.8 % — ABNORMAL LOW (ref 36.0–46.0)
Hemoglobin: 11.8 g/dL — ABNORMAL LOW (ref 12.0–15.0)
Immature Granulocytes: 5 %
Lymphocytes Relative: 18 %
Lymphs Abs: 1.9 10*3/uL (ref 0.7–4.0)
MCH: 30.3 pg (ref 26.0–34.0)
MCHC: 33 g/dL (ref 30.0–36.0)
MCV: 91.8 fL (ref 80.0–100.0)
Monocytes Absolute: 0.9 10*3/uL (ref 0.1–1.0)
Monocytes Relative: 9 %
Neutro Abs: 6.9 10*3/uL (ref 1.7–7.7)
Neutrophils Relative %: 66 %
Platelets: 295 10*3/uL (ref 150–400)
RBC: 3.9 MIL/uL (ref 3.87–5.11)
RDW: 15.5 % (ref 11.5–15.5)
WBC: 10.4 10*3/uL (ref 4.0–10.5)
nRBC: 0 % (ref 0.0–0.2)

## 2022-06-14 LAB — LIPASE, BLOOD: Lipase: 66 U/L — ABNORMAL HIGH (ref 11–51)

## 2022-06-14 MED ORDER — ACETAMINOPHEN 325 MG PO TABS
650.0000 mg | ORAL_TABLET | Freq: Four times a day (QID) | ORAL | Status: DC | PRN
  Administered 2022-06-16: 650 mg via ORAL
  Filled 2022-06-14: qty 2

## 2022-06-14 MED ORDER — HEPARIN SODIUM (PORCINE) 5000 UNIT/ML IJ SOLN
5000.0000 [IU] | Freq: Three times a day (TID) | INTRAMUSCULAR | Status: DC
  Administered 2022-06-14 – 2022-06-18 (×9): 5000 [IU] via SUBCUTANEOUS
  Filled 2022-06-14 (×9): qty 1

## 2022-06-14 MED ORDER — ONDANSETRON HCL 4 MG/2ML IJ SOLN
4.0000 mg | Freq: Four times a day (QID) | INTRAMUSCULAR | Status: DC | PRN
  Administered 2022-06-16: 4 mg via INTRAVENOUS
  Filled 2022-06-14: qty 2

## 2022-06-14 MED ORDER — OXYCODONE HCL 5 MG PO TABS
5.0000 mg | ORAL_TABLET | ORAL | Status: DC | PRN
  Administered 2022-06-16 – 2022-06-17 (×2): 5 mg via ORAL
  Filled 2022-06-14 (×2): qty 1

## 2022-06-14 MED ORDER — ONDANSETRON HCL 4 MG/2ML IJ SOLN
4.0000 mg | Freq: Once | INTRAMUSCULAR | Status: AC
  Administered 2022-06-14: 4 mg via INTRAVENOUS
  Filled 2022-06-14: qty 2

## 2022-06-14 MED ORDER — HYDROMORPHONE HCL 1 MG/ML IJ SOLN
0.5000 mg | Freq: Once | INTRAMUSCULAR | Status: AC
  Administered 2022-06-14: 0.5 mg via INTRAVENOUS
  Filled 2022-06-14: qty 0.5

## 2022-06-14 MED ORDER — ONDANSETRON HCL 4 MG PO TABS: 4.0000 mg | ORAL_TABLET | Freq: Four times a day (QID) | ORAL | Status: DC | PRN

## 2022-06-14 MED ORDER — PIPERACILLIN-TAZOBACTAM 3.375 G IVPB
3.3750 g | Freq: Three times a day (TID) | INTRAVENOUS | Status: DC
  Administered 2022-06-14 – 2022-06-18 (×11): 3.375 g via INTRAVENOUS
  Filled 2022-06-14 (×12): qty 50

## 2022-06-14 MED ORDER — IOHEXOL 300 MG/ML  SOLN
100.0000 mL | Freq: Once | INTRAMUSCULAR | Status: AC | PRN
  Administered 2022-06-14: 100 mL via INTRAVENOUS

## 2022-06-14 MED ORDER — MORPHINE SULFATE (PF) 2 MG/ML IV SOLN
2.0000 mg | INTRAVENOUS | Status: DC | PRN
  Administered 2022-06-16 (×3): 2 mg via INTRAVENOUS
  Filled 2022-06-14 (×3): qty 1

## 2022-06-14 MED ORDER — ACETAMINOPHEN 650 MG RE SUPP: 650.0000 mg | Freq: Four times a day (QID) | RECTAL | Status: DC | PRN

## 2022-06-14 NOTE — ED Notes (Signed)
Pt given two containers to put dentures and hearing aids in. Pt wanting to rest and sleep at this time

## 2022-06-14 NOTE — ED Notes (Signed)
Called Denise Parrish

## 2022-06-14 NOTE — Progress Notes (Signed)
Pharmacy Antibiotic Note  Denise Parrish is a 88 y.o. female admitted on 06/14/2022 with  intra-abdominal infection .  Pharmacy has been consulted for Zosyn dosing. Patient is afebrile with WBC of 10.4. CT abdomen is concerning for gallbladder perforation.   Renal function stable, current CrCl is 39.20m/min.   Plan: Zosyn 3.375g IV q8h (4 hour infusion). Follow culture data for de-escalation.  Monitor renal function for dose adjustments as indicated.    Height: 5' 5"  (165.1 cm) Weight: 77.1 kg (170 lb) IBW/kg (Calculated) : 57  Temp (24hrs), Avg:98.6 F (37 C), Min:98.4 F (36.9 C), Max:98.7 F (37.1 C)  Recent Labs  Lab 06/14/22 1705  WBC 10.4  CREATININE 0.83    Estimated Creatinine Clearance: 39.8 mL/min (by C-G formula based on SCr of 0.83 mg/dL).    No Known Allergies   Thank you for allowing pharmacy to be a part of this patient's care.  HVentura Sellers9/27/2023 7:07 PM

## 2022-06-14 NOTE — ED Notes (Addendum)
Pt requests, next of Kin [name] be contacted by the hospital provider if any surgeon consultation is to take place. [name] can be reached at [phone].

## 2022-06-14 NOTE — ED Notes (Signed)
Provider at bedside

## 2022-06-14 NOTE — ED Provider Notes (Signed)
[facility] EMERGENCY DEPARTMENT Provider Note   CSN: [id number] Arrival date & time: 06/14/22  1348     History  No chief complaint on file.   Denise Parrish is a 88 y.o. female.  HPI   Patient with medical history including atrial valve stenosis status post TAVR currently on Plavix, CAD, paroxysmal AV block with current pacemaker in place presents with complaints of pain from her drainage site.  Patient states that she was in [facility] last week, started have right upper quadrant tenderness with nausea and vomiting Saturday  went to the ER she was diagnosed with acute cholecystitis, cholecystostomy tube was placed and was discharged home on antibiotics.  Patient states she is here today because she is having worsening pain around her incision site, associated nausea without vomiting, is passing gas having bowel movements, denies any worsening stomach distention, no fevers no chills, states that they have been draining her tube as instructed, denies any blood in it.  Reviewed patient's discharge summary patient was at [facility], admitted for acute cholecystitis, was started on IV antibiotics, cholecystostomy tube was in place, and transition to oral antibiotics and was discharged home recommend close follow-up.  Home Medications Prior to Admission medications   Medication Sig Start Date End Date Taking? Authorizing Provider  acetaminophen (TYLENOL) 500 MG tablet Take 500 mg by mouth every 8 (eight) hours as needed for mild pain.   Yes [provider]  ALPRAZolam Duanne Moron) 0.5 MG tablet Take 0.5 mg by mouth at bedtime.   Yes [provider]  amoxicillin-clavulanate (AUGMENTIN) 875-125 MG tablet Take 1 tablet by mouth 2 (two) times daily. 06/13/22  Yes [provider]  atorvastatin (LIPITOR) 40 MG tablet Take 1 tablet (40 mg total) by mouth daily at 6 PM. 10/22/19  Yes Georgette Shell, MD  Cholecalciferol (VITAMIN D3 PO) Take by  mouth.   Yes [provider]  clopidogrel (PLAVIX) 75 MG tablet TAKE ONE TABLET (75 MG TOTAL) BY MOUTH DAILY WITH BREAKFAST. Patient taking differently: Take 75 mg by mouth daily. 11/28/21  Yes Josue Hector, MD  FEROSUL 325 (65 Fe) MG tablet TAKE ONE TABLET (325MG TOTAL) BY MOUTH DAILY Patient taking differently: Take 325 mg by mouth daily. 02/07/22  Yes Jodi Mourning, Kristen S, PA-C  glimepiride (AMARYL) 1 MG tablet Take 1 mg by mouth daily with breakfast.   Yes [provider]  Glycerin-Hypromellose-PEG 400 (VISINE DRY EYE OP) Place 1 drop into both eyes daily as needed (for dry eye relief).   Yes [provider]  meclizine (ANTIVERT) 12.5 MG tablet Take 1 tablet (12.5 mg total) by mouth 3 (three) times daily as needed for dizziness. 12/14/19  Yes Virgel Manifold, MD  metoprolol tartrate (LOPRESSOR) 25 MG tablet Take 0.5 tablets (12.5 mg total) by mouth 2 (two) times daily. 05/07/20  Yes Mikhail, Velta Addison, DO  Multiple Vitamin (MULTIVITAMIN) tablet Take 1 tablet by mouth daily.   Yes [provider]  Multiple Vitamins-Minerals (PRESERVISION AREDS 2 PO) Take 1 tablet by mouth daily. Takes 1 tablet in the morning and 1 tablet at night for eyes.   Yes [provider]  pantoprazole (PROTONIX) 40 MG tablet TAKE ONE TABLET (40 MG TOTAL) BY MOUTH ONCE DAILY. Patient taking differently: Take 40 mg by mouth daily. 09/09/21  Yes Vickie Epley, MD  vitamin C (ASCORBIC ACID) 500 MG tablet Take 500 mg by mouth daily.   Yes [provider]  Allergies    Patient has no known allergies.    Review of Systems   Review of Systems  Constitutional:  Negative for chills and fever.  Respiratory:  Negative for shortness of breath.   Cardiovascular:  Negative for chest pain.  Gastrointestinal:  Positive for abdominal pain and nausea. Negative for constipation and vomiting.  Neurological:  Negative for headaches.    Physical Exam Updated Vital Signs BP  139/62 (BP Location: Left Arm)   Pulse 92   Temp 98.7 F (37.1 C) (Oral)   Resp 19   Ht _0  (1.651 m)   Wt 77.1 kg   SpO2 97%   BMI 28.29 kg/m  Physical Exam Vitals and nursing note reviewed.  Constitutional:      General: She is not in acute distress.    Appearance: She is not ill-appearing.  HENT:     Head: Normocephalic and atraumatic.     Nose: No congestion.  Eyes:     Conjunctiva/sclera: Conjunctivae normal.  Cardiovascular:     Rate and Rhythm: Normal rate and regular rhythm.     Pulses: Normal pulses.     Heart sounds: No murmur heard.    No friction rub. No gallop.  Pulmonary:     Effort: No respiratory distress.     Breath sounds: No wheezing, rhonchi or rales.  Abdominal:     General: There is distension.     Palpations: Abdomen is soft.     Tenderness: There is abdominal tenderness. There is no right CVA tenderness or left CVA tenderness.     Comments: Abdomen distended, tympanic to percussion, noted cholecystectomy tube on the right upper quadrant, with bile-drainage present, no erythema edema around the incision site no evidence of infection noted, abdomen was soft but was tender around the incision site.  No guarding rebound or peritoneal sign.  Skin:    General: Skin is warm and dry.  Neurological:     Mental Status: She is alert.  Psychiatric:        Mood and Affect: Mood normal.     ED Results / Procedures / Treatments   Labs (all labs ordered are listed, but only abnormal results are displayed) Labs Reviewed  COMPREHENSIVE METABOLIC PANEL - Abnormal; Notable for the following components:      Result Value   Glucose, Bld 110 (*)    Albumin 2.7 (*)    AST 74 (*)    ALT 114 (*)    Alkaline Phosphatase 146 (*)    Total Bilirubin 1.5 (*)    All other components within normal limits  CBC WITH DIFFERENTIAL/PLATELET - Abnormal; Notable for the following components:   Hemoglobin 11.8 (*)    HCT 35.8 (*)    Abs Immature Granulocytes 0.55 (*)     All other components within normal limits  LIPASE, BLOOD - Abnormal; Notable for the following components:   Lipase 66 (*)    All other components within normal limits    EKG None  Radiology CT ABDOMEN PELVIS W CONTRAST  Result Date: 06/14/2022 CLINICAL DATA:  Biliary obstruction suspected.  Pain. EXAM: CT ABDOMEN AND PELVIS WITH CONTRAST TECHNIQUE: Multidetector CT imaging of the abdomen and pelvis was performed using the standard protocol following bolus administration of intravenous contrast. RADIATION DOSE REDUCTION: This exam was performed according to the departmental dose-optimization program which includes automated exposure control, adjustment of the mA and/or kV according to patient size and/or use of iterative reconstruction technique. CONTRAST:  182m OMNIPAQUE IOHEXOL  300 MG/ML  SOLN COMPARISON:  CTA abdomen and pelvis 11/28/2020 FINDINGS: Lower chest: There are trace bilateral pleural effusions. There is atelectasis in the lung bases. Hepatobiliary: There is gallbladder wall thickening with surrounding inflammatory stranding. A gallstone is again seen measuring 4 cm. Percutaneous cholecystostomy tube is in place coiled within the gallbladder. There is some questionable wall discontinuity along the inferior margin of the gallbladder seen on coronal image 5/47. No discrete separate fluid collection identified. No biliary ductal dilatation. Liver is within normal limits. Pancreas: Unremarkable. No pancreatic ductal dilatation or surrounding inflammatory changes. Spleen: Normal in size without focal abnormality. Adrenals/Urinary Tract: There is no hydronephrosis or perinephric stranding. There is a punctate 2 mm calculus in the superior pole the left kidney. Left renal cysts are present measuring up to 1.6 cm. There are subcentimeter cortical hypodensities in the right kidney which are also likely cysts. Rounded mildly hyperdense area in the right kidney measuring 12 mm appears unchanged from  the prior study. The adrenal glands and bladder are within normal limits. Stomach/Bowel: Stomach is within normal limits. Appendix appears normal. No evidence of bowel wall thickening, distention, or inflammatory changes. There is sigmoid colon diverticulosis. Vascular/Lymphatic: There are atherosclerotic calcifications of the aorta. Aorta is normal in size. There are nonenlarged periportal lymph nodes. No enlarged lymph nodes are identified. Reproductive: Right adnexal cystic area measuring 2.6 by 2.7 cm is likely related to the right ovary and appears new from prior examination. Left ovary and uterus are unremarkable. Other: There is no ascites or free intraperitoneal air. No focal abdominal wall hernia. There is a small amount of subcutaneous air in the lower right anterior abdominal wall which may be related to medication injection site. Musculoskeletal: Right hip arthroplasty is present. Multilevel degenerative changes affect the spine. IMPRESSION: 1. Cholecystostomy tube is in place. Gallbladder wall thickening with surrounding inflammatory stranding compatible with cholecystitis. There is some questionable wall discontinuity along the inferior margin of the gallbladder worrisome for gallbladder perforation. No discrete fluid collection identified. 2. Trace bilateral pleural effusions. 3. Nonobstructing left renal calculus. 4. Bilateral renal cysts. There is a hyperdense lesion in the right kidney which is indeterminate. 5. 2.7 cm right ovarian simple-appearing cyst. Recommend prompt follow-up with pelvic US. Electronically Signed   By: Ronney Asters M.D.   On: 06/14/2022 18:43   DG Abdomen Acute W/Chest  Result Date: 06/14/2022 CLINICAL DATA:  Shortness of breath with abdomen pain EXAM: DG ABDOMEN ACUTE WITH 1 VIEW CHEST COMPARISON:  10/26/2021 FINDINGS: Single-view chest demonstrates left-sided pacing device. No acute airspace disease. Normal cardiac size. Supine and upright views of the abdomen  demonstrate no free air beneath the diaphragm. Nonobstructed gas pattern with moderate right colonic stool. Right upper quadrant drainage catheter. Right hip replacement. IMPRESSION: 1. No radiographic evidence for acute cardiopulmonary abnormality 2. Nonobstructed gas pattern Electronically Signed   By: Donavan Foil M.D.   On: 06/14/2022 17:37    Procedures Procedures    Medications Ordered in ED Medications  piperacillin-tazobactam (ZOSYN) IVPB 3.375 g (has no administration in time range)  ondansetron (ZOFRAN) injection 4 mg (4 mg Intravenous Given 06/14/22 1705)  HYDROmorphone (DILAUDID) injection 0.5 mg (0.5 mg Intravenous Given 06/14/22 1705)  iohexol (OMNIPAQUE) 300 MG/ML solution 100 mL (100 mLs Intravenous Contrast Given 06/14/22 1827)    ED Course/ Medical Decision Making/ A&P                           Medical  Decision Making Amount and/or Complexity of Data Reviewed Labs: ordered. Radiology: ordered.  Risk Prescription drug management. Decision regarding hospitalization.   This patient presents to the ED for concern of abdominal pain, this involves an extensive number of treatment options, and is a complaint that carries with it a high risk of complications and morbidity.  The differential diagnosis includes abscess, gallbladder perforation, choledocholithiasis Coley angitis, bowel obstruction,    Additional history obtained:  Additional history obtained from daughter at bedside External records from outside source obtained and reviewed including medical record from [facility]   Co morbidities that complicate the patient evaluation  Status post aorta valve replacement, CVA  Social Determinants of Health:  N/A    Lab Tests:  I Ordered, and personally interpreted labs.  The pertinent results include: CBC shows normocytic anemia hemoglobin 11.8, CMP shows AST of 74 ALT 114 alk phos 164 T. bili 1.5 lipase 66   Imaging Studies ordered:  I ordered  imaging studies including acute chest abdomen, CT abdomen pelvis I independently visualized and interpreted imaging which showed acute chest abdomen were negative for significant findings, CT abdomen pelvis reveals cholecystostomy tube in place gallbladder wall thickening with acute cholecystitis, possible  consistent erosion around the gallbladder concern for possible perforation I agree with the radiologist interpretation   Cardiac Monitoring:  The patient was maintained on a cardiac monitor.  I personally viewed and interpreted the cardiac monitored which showed an underlying rhythm of: N/A   Medicines ordered and prescription drug management:  I ordered medication including antiemetics, pain medications, antibiotics I have reviewed the patients home medicines and have made adjustments as needed  Critical Interventions:  N/A   Reevaluation:  Presents with right upper quadrant tenderness, concern for possible abscess will obtain basic lab work imaging and reassess  Patient CMP shows elevation liver signs alk phos, we will proceed with CT and pelvis for further evaluation  CT abdomen pelvis reveals possible gallbladder perforation will consult general surgery  Updated family and they are agreement this plan will consult medicine for admission  Consultations Obtained:  I requested consultation with the general surgeon Dr. Arnoldo Morale,  and discussed lab and imaging findings as well as pertinent plan - they recommend: Recommends medicine admission started on antibiotics he will be available in the morning for formal consultation patient may have liquid diet    Test Considered:  N/A    Rule out Low suspicion for pneumonia she denies any URI-like symptoms lung sounds are clear bilaterally.  I doubt PE denies pleuritic chest pain or shortness of breath vital signs are reassuring.  I have low suspicion for volvulus, bowel obstruction, diverticulitis, perforation, abscess is CT  imaging is negative for these findings.    Dispostion and problem list  After consideration of the diagnostic results and the patients response to treatment, I feel that the patent would benefit from admission.  Acute cholecystitis-started on IV antibiotics, patient can remain on a liquid diet, will need formal consultation by general surgery in the morning.  Due to shift change patient handoff to Almyra Free idol she will admit the patient to hospitalist team.            Final Clinical Impression(s) / ED Diagnoses Final diagnoses:  Calculus of gallbladder with acute cholecystitis without obstruction    Rx / DC Orders ED Discharge Orders     None         Aron Baba 06/14/22 1917    Dorie Rank, MD  06/16/22 3704

## 2022-06-14 NOTE — ED Triage Notes (Signed)
Pt with pain to drain site due to large gallstone. Pt had percutaneous cholecystostomy tube on 9/23

## 2022-06-15 ENCOUNTER — Inpatient Hospital Stay (HOSPITAL_COMMUNITY): Payer: Medicare Other

## 2022-06-15 DIAGNOSIS — E119 Type 2 diabetes mellitus without complications: Secondary | ICD-10-CM | POA: Diagnosis not present

## 2022-06-15 DIAGNOSIS — R7989 Other specified abnormal findings of blood chemistry: Secondary | ICD-10-CM | POA: Diagnosis not present

## 2022-06-15 DIAGNOSIS — K8000 Calculus of gallbladder with acute cholecystitis without obstruction: Secondary | ICD-10-CM | POA: Diagnosis not present

## 2022-06-15 DIAGNOSIS — K810 Acute cholecystitis: Secondary | ICD-10-CM | POA: Diagnosis not present

## 2022-06-15 DIAGNOSIS — K219 Gastro-esophageal reflux disease without esophagitis: Secondary | ICD-10-CM

## 2022-06-15 LAB — CBC WITH DIFFERENTIAL/PLATELET
Abs Immature Granulocytes: 0.51 10*3/uL — ABNORMAL HIGH (ref 0.00–0.07)
Basophils Absolute: 0 10*3/uL (ref 0.0–0.1)
Basophils Relative: 0 %
Eosinophils Absolute: 0.1 10*3/uL (ref 0.0–0.5)
Eosinophils Relative: 1 %
HCT: 32 % — ABNORMAL LOW (ref 36.0–46.0)
Hemoglobin: 10.5 g/dL — ABNORMAL LOW (ref 12.0–15.0)
Immature Granulocytes: 5 %
Lymphocytes Relative: 16 %
Lymphs Abs: 1.6 10*3/uL (ref 0.7–4.0)
MCH: 30.4 pg (ref 26.0–34.0)
MCHC: 32.8 g/dL (ref 30.0–36.0)
MCV: 92.8 fL (ref 80.0–100.0)
Monocytes Absolute: 0.8 10*3/uL (ref 0.1–1.0)
Monocytes Relative: 8 %
Neutro Abs: 6.8 10*3/uL (ref 1.7–7.7)
Neutrophils Relative %: 70 %
Platelets: 287 10*3/uL (ref 150–400)
RBC: 3.45 MIL/uL — ABNORMAL LOW (ref 3.87–5.11)
RDW: 15.4 % (ref 11.5–15.5)
WBC: 9.8 10*3/uL (ref 4.0–10.5)
nRBC: 0 % (ref 0.0–0.2)

## 2022-06-15 LAB — GLUCOSE, CAPILLARY
Glucose-Capillary: 96 mg/dL (ref 70–99)
Glucose-Capillary: 157 mg/dL — ABNORMAL HIGH (ref 70–99)
Glucose-Capillary: 145 mg/dL — ABNORMAL HIGH (ref 70–99)
Glucose-Capillary: 174 mg/dL — ABNORMAL HIGH (ref 70–99)
Glucose-Capillary: 164 mg/dL — ABNORMAL HIGH (ref 70–99)

## 2022-06-15 LAB — COMPREHENSIVE METABOLIC PANEL
ALT: 103 U/L — ABNORMAL HIGH (ref 0–44)
AST: 66 U/L — ABNORMAL HIGH (ref 15–41)
Albumin: 2.4 g/dL — ABNORMAL LOW (ref 3.5–5.0)
Alkaline Phosphatase: 122 U/L (ref 38–126)
Anion gap: 9 (ref 5–15)
BUN: 18 mg/dL (ref 8–23)
CO2: 26 mmol/L (ref 22–32)
Calcium: 8.6 mg/dL — ABNORMAL LOW (ref 8.9–10.3)
Chloride: 100 mmol/L (ref 98–111)
Creatinine, Ser: 0.84 mg/dL (ref 0.44–1.00)
GFR, Estimated: >60 mL/min (ref >60)
Glucose, Bld: 103 mg/dL — ABNORMAL HIGH (ref 70–99)
Potassium: 4.1 mmol/L (ref 3.5–5.1)
Sodium: 135 mmol/L (ref 135–145)
Total Bilirubin: 1.4 mg/dL — ABNORMAL HIGH (ref 0.3–1.2)
Total Protein: 6 g/dL — ABNORMAL LOW (ref 6.5–8.1)

## 2022-06-15 LAB — MAGNESIUM: Magnesium: 1.9 mg/dL (ref 1.7–2.4)

## 2022-06-15 MED ORDER — PANTOPRAZOLE SODIUM 40 MG PO TBEC
40.0000 mg | DELAYED_RELEASE_TABLET | Freq: Every day | ORAL | Status: DC
  Administered 2022-06-15 – 2022-06-18 (×4): 40 mg via ORAL
  Filled 2022-06-15 (×4): qty 1

## 2022-06-15 MED ORDER — METHOCARBAMOL 500 MG PO TABS
500.0000 mg | ORAL_TABLET | Freq: Three times a day (TID) | ORAL | Status: DC | PRN
  Administered 2022-06-16: 500 mg via ORAL
  Filled 2022-06-15: qty 1

## 2022-06-15 MED ORDER — INSULIN ASPART 100 UNIT/ML IJ SOLN
0.0000 [IU] | Freq: Three times a day (TID) | INTRAMUSCULAR | Status: DC
  Administered 2022-06-15: 2 [IU] via SUBCUTANEOUS
  Administered 2022-06-15 – 2022-06-16 (×3): 3 [IU] via SUBCUTANEOUS
  Administered 2022-06-17 – 2022-06-18 (×3): 2 [IU] via SUBCUTANEOUS

## 2022-06-15 MED ORDER — METOPROLOL TARTRATE 25 MG PO TABS
12.5000 mg | ORAL_TABLET | Freq: Two times a day (BID) | ORAL | Status: DC
  Administered 2022-06-15 – 2022-06-18 (×7): 12.5 mg via ORAL
  Filled 2022-06-15 (×7): qty 1

## 2022-06-15 MED ORDER — CLOPIDOGREL BISULFATE 75 MG PO TABS
75.0000 mg | ORAL_TABLET | Freq: Every day | ORAL | Status: DC
  Administered 2022-06-15 – 2022-06-18 (×4): 75 mg via ORAL
  Filled 2022-06-15 (×4): qty 1

## 2022-06-15 MED ORDER — INSULIN ASPART 100 UNIT/ML IJ SOLN: 0.0000 [IU] | Freq: Every day | INTRAMUSCULAR | Status: DC

## 2022-06-15 MED ORDER — ALPRAZOLAM 0.5 MG PO TABS
0.5000 mg | ORAL_TABLET | Freq: Every day | ORAL | Status: DC
  Administered 2022-06-15 – 2022-06-17 (×3): 0.5 mg via ORAL
  Filled 2022-06-15 (×3): qty 1

## 2022-06-15 NOTE — Assessment & Plan Note (Signed)
-   Hold glimepiride - Sliding scale coverage

## 2022-06-15 NOTE — Assessment & Plan Note (Signed)
Continue metoprolol. 

## 2022-06-15 NOTE — Progress Notes (Signed)
Brief same day note:  Patient is a 88 year old female with history of anxiety, GERD, hyperlipidemia, hypertension, status post TAVR who presents to the emergency department for evaluation of right upper quadrant drain.  She was back in [facility] where she developed nausea, vomiting, abdominal pain and was diagnosed with acute cholecystitis in the regional hospital, was placed a percutaneous drain, discharged on Augmentin and was recommended to follow-up with general surgery at home.  Patient was complaining of pain on the drain site.  General surgery consulted. Patient seen and examined the bedside this morning.  During my evaluation, she was sitting in the chair without any complaints.  Appears comfortable.   Brief assessment and plan:  Acute cholecystitis: Recently diagnosed with cholecystitis while at vacation at [facility].  Who was treated with IV antibiotics, drain placement, discharged with Augmentin.  Patient presented with increased pain in the right upper quadrant. Zosyn  started here.  General surgery consulted.  Drain functioning.  Diet has been continued.  CT abdomen shows inflammation around the gallbladder and possible perforation /necrotic changes.  Also shows a 4 cm gallstone.  Her abdomen is benign on examination.  Not septic.  General surgery not planning for surgical intervention.  General surgery recommends IR follow-up in 1 to 2 weeks for cholecystectomy tube check and the decision for cholecystectomy will be made in the future  Elevated liver enzymes: Mildly elevated, started on hold.  Continue to trend  Hypertension: Continue metoprolol  GERD: Continue PPI  Normocytic anemia: Currently hemoglobin stable in the range of 10  Diabetes type 2: Continue sliding scale insulin.  Glimepiride on hold.  Monitor blood sugars  Called and discussed with daughter [name]  on phone on 9/28

## 2022-06-15 NOTE — Progress Notes (Signed)
Pt off unit

## 2022-06-15 NOTE — H&P (Signed)
History and Physical    Patient: Denise Parrish QTM:[id number] DOB: [birth year] DOA: 06/14/2022 DOS: the patient was seen and examined on 06/15/2022 PCP: [name], MD  Patient coming from: Home  Chief Complaint: No chief complaint on file.  HPI: Denise Parrish is a 88 y.o. female with medical history significant of anxiety, GERD, hiatal hernia, hyperlipidemia, hypertension Status post TAVR, and more presents to the ER with a chief complaint of exhaustion due to the drainage from the gallbladder.  Patient was on vacation [facility] when she had nausea, vomiting, abdominal pain.  She went to the hospital was found to have acute cholecystitis.  They placed a percutaneous drain and had patient on antibiotics, first on Zosyn and then discharged with Augmentin.  They told her to follow-up if she returned home.  She presents to the hospital today because she has a strain, but her abdomen is still sore, and she is feeling fatigued.  She thinks she just started the Augmentin today.  She reports that she was not discharged with any pain medications.  She has not had any nausea or vomiting since her hospitalization.  When she was having nausea and vomiting there was no overt blood.  She seen no blood in her stool however she is on iron supplements to it would be hard to discern melanotic stool.  Patient reports that because of the drainage she has had dyspnea.  This is because when she takes deep breath it hurts her at the drain location.  Patient was in her normal state of health prior to going on vacation.  She has no other complaints.  Patient does not smoke, rarely drinks alcohol, does not use illicit drugs.  She has had her COVID shot.  Patient is full code. Review of Systems: As mentioned in the history of present illness. All other systems reviewed and are negative. 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 ROk Edwardsdiverticulosis, negative microscopic  colitis    COLONOSCOPY WITH PROPOFOL N/A 11/27/2020   Surgeon: CHarvel Quale MD;  diverticulosis, no active bleeding.  Two 2-4 mm polyps not removed in the setting of Plavix.   CORONARY STENT INTERVENTION N/A 09/08/2020   Procedure:  CORONARY STENT INTERVENTION;  Surgeon: Sherren Mocha, MD;  Location: [place] [facility];  Service: Cardiovascular;  Laterality: N/A;   ENDARTERECTOMY Right 10/21/2019   Procedure: ENDARTERECTOMY CAROTID RIGHT;  Surgeon: Waynetta Sandy, MD;  Location: [place];  Service: Vascular;  Laterality: Right;   ESOPHAGOGASTRODUODENOSCOPY  04/03/2012   Dr. Jennet Maduro hernia, chronic gastritis on bx   ESOPHAGOGASTRODUODENOSCOPY (EGD) WITH PROPOFOL N/A 04/28/2020   Procedure: ESOPHAGOGASTRODUODENOSCOPY (EGD) WITH PROPOFOL;  Surgeon: Otis Brace, MD;  Location: [place];  Service: Gastroenterology;  Laterality: N/A;    ESOPHAGOGASTRODUODENOSCOPY (EGD) WITH PROPOFOL N/A 11/27/2020   Surgeon: Montez Morita, Quillian Quince, MD; findings suggestive of short segment Barrett's, normal stomach, and small bowel.   FEMORAL ARTERY EXPLORATION Right 09/28/2020   Procedure: RIGHT GROIN EXPLORATION WITH REPAIR RIGHT COMMON FEMORAL ARTERY;  Surgeon: [name], MD;  Location[facility];  Service: Vascular;  Laterality: Right;   GIVENS CAPSULE STUDY  05/09/2012   RMR: an unclear raised area of small bowel was noted, with features almost  characteristic of very small polyp. This was without villous  blunting or any evidence of active bleeding; yet, the area of  concern appeared to be erythematous. However, this could simply  be a light reflection on a normal variation of the small bowel. REFERRED TO DR. GILLIAM, APPT FOR NOVEMBER 2013.    GIVENS CAPSULE STUDY N/A 04/30/2020   Procedure: GIVENS CAPSULE STUDY;  Surgeon: Otis Brace, MD;  Location: [place];  Service: Gastroenterology;  Laterality: N/A;   [facility] STUDY  11/27/2020    Surgeon: Montez Morita, Quillian Quince, MD;  questionable nodule/small mass in the distal small bowel but without ulceration.  Evidence of hematochezia throughout the colon but no fresh blood.    HIP ARTHROPLASTY Right    INSERT / REPLACE / REMOVE PACEMAKER     PACEMAKER IMPLANT N/A 07/12/2021   Procedure: PACEMAKER IMPLANT;  Surgeon: Vickie Epley, MD;  Location: [place] [facility];  Service: Cardiovascular;  Laterality: N/A;   PATCH ANGIOPLASTY Right 10/21/2019   Procedure: PATCH ANGIOPLASTY USING Rueben Bash BIOLOGIC PATCH;  Surgeon: Waynetta Sandy, MD;  Location: [place];  Service: Vascular;  Laterality: Right;   POLYPECTOMY  04/28/2020   Procedure: POLYPECTOMY;  Surgeon: Otis Brace, MD;  Location: [place] [facility];  Service: Gastroenterology;;   RIGHT/LEFT HEART CATH AND CORONARY ANGIOGRAPHY N/A 09/08/2020   Procedure: RIGHT/LEFT HEART CATH AND CORONARY ANGIOGRAPHY;   Surgeon: Sherren Mocha, MD;  Location: [place] [facility];  Service: Cardiovascular;  Laterality: N/A;   TEE WITHOUT CARDIOVERSION N/A 09/28/2020   Procedure: TRANSESOPHAGEAL ECHOCARDIOGRAM (TEE);  Surgeon: Sherren Mocha, MD;  Location: [place];  Service: Open Heart Surgery;  Laterality: N/A;   TONSILLECTOMY     TOTAL HIP ARTHROPLASTY  1995   Right   TRANSCATHETER AORTIC VALVE REPLACEMENT, TRANSFEMORAL N/A 09/28/2020   Procedure: TRANSCATHETER AORTIC VALVE REPLACEMENT, TRANSFEMORAL;  Surgeon: Sherren Mocha, MD;  Location: [place];  Service: Open Heart Surgery;  Laterality: N/A;   Social History:  reports that she has never smoked. She has never used smokeless tobacco. She reports that she does not drink alcohol and does not use drugs.  No Known Allergies  Family History  Problem Relation Age of Onset   Cirrhosis Mother        no etoh use   CAD Father    Diabetes Mother        + brother   Heart failure Father    Hypertension Father        +  brother   Cancer Brother        lung, age 86   Uterine cancer Sister    Colon cancer Neg Hx     Prior to Admission medications   Medication Sig Start Date End Date Taking? Authorizing Provider  acetaminophen (TYLENOL) 500 MG tablet Take 500 mg by mouth every 8 (eight) hours as needed for mild pain.   Yes [provider]  ALPRAZolam Duanne Moron) 0.5 MG tablet Take 0.5 mg by mouth at bedtime.   Yes [provider]  amoxicillin-clavulanate (AUGMENTIN) 875-125 MG tablet Take 1 tablet by mouth 2 (two) times daily. 06/13/22  Yes [provider]  atorvastatin (LIPITOR) 40 MG tablet Take 1 tablet (40 mg total) by mouth daily at 6 PM. 10/22/19  Yes Georgette Shell, MD  Cholecalciferol (VITAMIN D3 PO) Take by mouth.   Yes [provider]  clopidogrel (PLAVIX) 75 MG tablet TAKE ONE TABLET (75 MG TOTAL) BY MOUTH DAILY WITH BREAKFAST. Patient taking differently: Take 75 mg by mouth daily. 11/28/21  Yes Josue Hector, MD   FEROSUL 325 (65 Fe) MG tablet TAKE ONE TABLET (325MG TOTAL) BY MOUTH DAILY Patient taking differently: Take 325 mg by mouth daily. 02/07/22  Yes Jodi Mourning, Kristen S, PA-C  glimepiride (AMARYL) 1 MG tablet Take 1 mg by mouth daily with breakfast.   Yes [provider]  Glycerin-Hypromellose-PEG 400 (VISINE DRY EYE OP) Place 1 drop into both eyes daily as needed (for dry eye relief).   Yes [provider]  meclizine (ANTIVERT) 12.5 MG tablet Take 1 tablet (12.5 mg total) by mouth 3 (three) times daily as needed for dizziness. 12/14/19  Yes Virgel Manifold, MD  metoprolol tartrate (LOPRESSOR) 25 MG tablet Take 0.5 tablets (12.5 mg total) by mouth 2 (two) times daily. 05/07/20  Yes Mikhail, Velta Addison, DO  Multiple Vitamin (MULTIVITAMIN) tablet Take 1 tablet by mouth daily.   Yes [provider]  Multiple Vitamins-Minerals (PRESERVISION AREDS 2 PO) Take 1 tablet by mouth daily. Takes 1 tablet in the morning and 1 tablet at night for eyes.   Yes [provider]  pantoprazole (PROTONIX) 40 MG tablet TAKE ONE TABLET (40 MG TOTAL) BY MOUTH ONCE DAILY. Patient taking differently: Take 40 mg by mouth daily. 09/09/21  Yes Vickie Epley, MD  vitamin C (ASCORBIC ACID) 500 MG tablet Take 500 mg by mouth daily.   Yes [provider]    Physical Exam: Vitals:   06/15/22 0000 06/15/22 0100 06/15/22 0400 06/15/22 0429  BP: 120/62  (!) 122/59   Pulse: 73  67   Resp: 17  17   Temp:    98.3 F (36.8 C)  TempSrc:    Oral  SpO2: 90% 93% 96%   Weight:      Height:       1.  General: Patient lying supine in bed,  no acute distress   2. Psychiatric: Alert and oriented x 3, mood and behavior normal for situation, pleasant and cooperative with exam   3. Neurologic: Speech and language are normal, face is symmetric, moves all 4 extremities voluntarily, at baseline without acute deficits on limited exam   4. HEENMT:  Head is atraumatic, normocephalic, pupils reactive  to light, neck is supple, trachea is midline, mucous membranes are moist   5. Respiratory : Lungs are clear to auscultation bilaterally without wheezing, rhonchi, rales, no cyanosis, no increase in work of breathing or accessory muscle use   6. Cardiovascular : Heart  rate normal, rhythm is regular, murmur present, rubs or gallops, no peripheral edema, peripheral pulses palpated   7. Gastrointestinal:  Abdomen is soft, mildly distended, right upper quadrant percutaneous drain with light brown fluid present, tender next to the drain, but otherwise the abdomen is nontender, bowel sounds active, no masses or organomegaly palpated   8. Skin:  Skin is warm, dry and intact without rashes, acute lesions, or ulcers on limited exam   9.Musculoskeletal:  No acute deformities or trauma, no asymmetry in tone, no peripheral edema, peripheral pulses palpated, no tenderness to palpation in the extremities  Data Reviewed: In the ED Temp 98.4, heart rate 69-92, respiratory rate 18-19, blood pressure 119/55-139/62, satting 95-97% No leukocytosis with white blood cell count 10.4, hemoglobin 11.8, platelets 295 Chemistry is unremarkable aside from a very slightly elevated potassium at 5.1, alk phos 146, albumin 2.7, lipase 66, AST 74, ALT 114 Zosyn started in the ED General surgery consulted and will not plan for surgery I then personally spoke to general surgery to make sure they had seen CT scan orts that there was a possible perforation Records were requested from outside facility and should be at bedside Admission was requested for IV antibiotics for acute cholecystitis  Assessment and Plan: * Acute cholecystitis - Patient has been treated with Zosyn in outside facility and sent home with Augmentin and a percutaneous drain - Continue Zosyn - Hold Augmentin while in the hospital - Output in drain appears to be bile - General surgery consulted but does not plan on surgery at this time so patient has  been allowed to eat - CT scan shows increased inflammation around the gallbladder and possible Perf, but Dr. Arnoldo Morale reviewed and does not think there is a perforation there and if there was the drain is already in place to prevent a bile leak  Abnormal LFTs - LFTs elevated at AST 74, ALT 114, consistent with gallbladder pathology - Hold statin - Continue to trend  Essential hypertension - Continue metoprolol  Gastroesophageal reflux disease - Continue Protonix  Diabetes mellitus, type 2 ([place]) - Hold glimepiride - Sliding scale coverage      Advance Care Planning:   Code Status: Full Code   Consults: General surgery  Family Communication: No family at bedside  Severity of Illness: The appropriate patient status for this patient is INPATIENT. Inpatient status is judged to be reasonable and necessary in order to provide the required intensity of service to ensure the patient's safety. The patient's presenting symptoms, physical exam findings, and initial radiographic and laboratory data in the context of their chronic comorbidities is felt to place them at high risk for further clinical deterioration. Furthermore, it is not anticipated that the patient will be medically stable for discharge from the hospital within 2 midnights of admission.   * I certify that at the point of admission it is my clinical judgment that the patient will require inpatient hospital care spanning beyond 2 midnights from the point of admission due to high intensity of service, high risk for further deterioration and high frequency of surveillance required.*  Author: Rolla Plate, DO 06/15/2022 5:52 AM  For on call review [email or web address].

## 2022-06-15 NOTE — Assessment & Plan Note (Signed)
-   Patient has been treated with Zosyn in outside facility and sent home with Augmentin and a percutaneous drain - Continue Zosyn - Hold Augmentin while in the hospital - Output in drain appears to be bile - General surgery consulted but does not plan on surgery at this time so patient has been allowed to eat - CT scan shows increased inflammation around the gallbladder and possible Perf, but Dr. Arnoldo Morale reviewed and does not think there is a perforation there and if there was the drain is already in place to prevent a bile leak

## 2022-06-15 NOTE — Assessment & Plan Note (Signed)
Continue Protonix °

## 2022-06-15 NOTE — TOC Progression Note (Signed)
  Transition of Care [facility]) Screening Note   Patient Details  Name: Denise Parrish Date of Birth: 03-29-1923   Transition of Care [facility]) CM/SW Contact:    Shade Flood, LCSW Phone Number: 06/15/2022, 12:54 PM    Transition of Care Department [facility]) has reviewed patient and no TOC needs have been identified at this time. We will continue to monitor patient advancement through interdisciplinary progression rounds. If new patient transition needs arise, please place a TOC consult.

## 2022-06-15 NOTE — Progress Notes (Signed)
Jp drain output 92m.

## 2022-06-15 NOTE — Assessment & Plan Note (Signed)
-   LFTs elevated at AST 74, ALT 114, consistent with gallbladder pathology - Hold statin - Continue to trend

## 2022-06-15 NOTE — Consult Note (Signed)
Reason for Consult: Cholecystitis, cholelithiasis, status post percutaneous cholecystostomy tube placement Referring Physician: Dr. Jill Side Denise Parrish is an 88 y.o. female.  HPI: Patient is a 88 year old white female with a history of GERD, hiatal hernia, hypertension, status post TAVR who was on vacation in [place] when she started having upper abdominal pain.  She was diagnosed with acute cholecystitis with cholelithiasis.  Apparently had a cholecystostomy tube placed.  She was in the hospital for approximately a week and was discharged approximately 5 days ago.  She was discharged on Augmentin.  I do not have the old records from [facility].  She was told that should she require surgical intervention, it should be done in this area.  She was seen by her primary care physician yesterday as she was complaining of some upper abdominal bloating and was sent to the emergency room for further evaluation and treatment.  She was noted to have some transaminitis.  A CT scan of the abdomen pelvis was performed which revealed adequate positioning of the cholecystostomy tube, but a possible disruption of the inferior wall of the gallbladder.  A large stone is still present.  She did not have any free air or significant abscess or biloma formation in the right upper quadrant.  She was admitted to the hospital and this morning states she feels fine.  Her upper abdominal bloating has resolved.  She has been on just a clear liquid diet since her discharge.  She denies any fever or chills.  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 ROk Edwardsdiverticulosis, negative microscopic  colitis    COLONOSCOPY WITH PROPOFOL N/A 11/27/2020   Surgeon: CHarvel Quale MD;  diverticulosis, no active bleeding.  Two 2-4 mm polyps not removed in the setting of Plavix.   CORONARY STENT INTERVENTION N/A 09/08/2020   Procedure: CORONARY STENT INTERVENTION;  Surgeon: CSherren Mocha MD;  [facility];  Service: Cardiovascular;  Laterality: N/A;   ENDARTERECTOMY Right 10/21/2019   Procedure: ENDARTERECTOMY CAROTID RIGHT;  Surgeon: CWaynetta Sandy MD;  [facility]  Service: Vascular;  Laterality: Right;   ESOPHAGOGASTRODUODENOSCOPY  04/03/2012   Dr. RJennet Madurohernia, chronic gastritis on bx  ESOPHAGOGASTRODUODENOSCOPY (EGD) WITH PROPOFOL N/A 04/28/2020   Procedure: ESOPHAGOGASTRODUODENOSCOPY (EGD) WITH PROPOFOL;  Surgeon: Otis Brace, MD;  Location: [facility];  Service: Gastroenterology;  Laterality: N/A;   ESOPHAGOGASTRODUODENOSCOPY (EGD) WITH PROPOFOL N/A 11/27/2020   Surgeon: Montez Morita, Quillian Quince, MD; findings suggestive of short segment Barrett's, normal stomach, and small bowel.   FEMORAL ARTERY EXPLORATION Right 09/28/2020   Procedure: RIGHT GROIN EXPLORATION WITH REPAIR RIGHT COMMON FEMORAL ARTERY;   Surgeon: [name], MD;  [facility];  Service: Vascular;  Laterality: Right;   GIVENS CAPSULE STUDY  05/09/2012   RMR: an unclear raised area of small bowel was noted, with features almost  characteristic of very small polyp. This was without villous  blunting or any evidence of active bleeding; yet, the area of  concern appeared to be erythematous. However, this could simply  be a light reflection on a normal variation of the small bowel. REFERRED TO DR. GILLIAM, APPT FOR NOVEMBER 2013.    GIVENS CAPSULE STUDY N/A 04/30/2020   Procedure: GIVENS CAPSULE STUDY;  Surgeon: Otis Brace, MD;  Location: [place];  Service: Gastroenterology;  Laterality: N/A;   [place] STUDY  11/27/2020    Surgeon: Montez Morita, Quillian Quince, MD;  questionable nodule/small mass in the distal small bowel but without ulceration.  Evidence of hematochezia throughout the colon but no fresh blood.    HIP ARTHROPLASTY Right    INSERT / REPLACE / REMOVE PACEMAKER     PACEMAKER IMPLANT N/A 07/12/2021   Procedure: PACEMAKER IMPLANT;  Surgeon: Vickie Epley, MD;  Location: [facility];  Service: Cardiovascular;  Laterality: N/A;   PATCH ANGIOPLASTY Right 10/21/2019   Procedure: PATCH ANGIOPLASTY USING Rueben Bash BIOLOGIC PATCH;  Surgeon: Waynetta Sandy, MD;  Location: [place];  Service: Vascular;  Laterality: Right;   POLYPECTOMY  04/28/2020   Procedure: POLYPECTOMY;  Surgeon: Otis Brace, MD;  Location: [facility];  Service: Gastroenterology;;   RIGHT/LEFT HEART CATH AND CORONARY ANGIOGRAPHY N/A 09/08/2020   Procedure: RIGHT/LEFT HEART CATH AND CORONARY ANGIOGRAPHY;  Surgeon: Sherren Mocha, MD;  Location: [facility];  Service: Cardiovascular;  Laterality: N/A;   TEE WITHOUT CARDIOVERSION N/A 09/28/2020   Procedure: TRANSESOPHAGEAL ECHOCARDIOGRAM (TEE);  Surgeon: Sherren Mocha, MD;  Location: [facility];  Service: Open Heart Surgery;  Laterality: N/A;   TONSILLECTOMY      TOTAL HIP ARTHROPLASTY  1995   Right   TRANSCATHETER AORTIC VALVE REPLACEMENT, TRANSFEMORAL N/A 09/28/2020   Procedure: TRANSCATHETER AORTIC VALVE REPLACEMENT, TRANSFEMORAL;  Surgeon: Sherren Mocha, MD;  Location: [facility];  Service: Open Heart Surgery;  Laterality: N/A;    Family History  Problem Relation Age of Onset   Cirrhosis Mother        no etoh use   CAD Father    Diabetes Mother        + brother   Heart failure Father    Hypertension Father        + brother   Cancer Brother        lung, age 33   Uterine cancer Sister    Colon cancer Neg Hx     Social History:  reports that she has never smoked. She has never used smokeless tobacco. She reports that she does not drink alcohol and does not use drugs.  Allergies: No Known Allergies  Medications: I have reviewed the patient's current medications.  Results for orders placed or performed during the hospital encounter of 06/14/22 (from the past 48 hour(s))  Comprehensive  metabolic panel     Status: Abnormal   Collection Time: 06/14/22  5:05 PM  Result Value Ref Range   Sodium 137 135 - 145 mmol/L   Potassium 5.1 3.5 - 5.1 mmol/L   Chloride 100 98 - 111 mmol/L   CO2 27 22 - 32 mmol/L   Glucose, Bld 110 (H) 70 - 99 mg/dL    Comment: Glucose reference range applies only to samples taken after fasting for at least 8 hours.   BUN 19 8 - 23 mg/dL   Creatinine, Ser 0.83 0.44 - 1.00 mg/dL   Calcium 9.0 8.9 - 10.3 mg/dL   Total Protein 6.8 6.5 - 8.1 g/dL   Albumin 2.7 (L) 3.5 - 5.0 g/dL   AST 74 (H) 15 - 41 U/L   ALT 114 (H) 0 - 44 U/L   Alkaline Phosphatase 146 (H) 38 - 126 U/L   Total Bilirubin 1.5 (H) 0.3 - 1.2 mg/dL   GFR, Estimated >60 >60 mL/min    Comment: (NOTE) Calculated using the CKD-EPI Creatinine Equation (2021)    Anion gap 10 5 - 15    Comment: Performed at [facility], [address] [place], [place] [address]  CBC with Differential     Status: Abnormal   Collection Time: 06/14/22  5:05 PM  Result Value  Ref Range   WBC 10.4 4.0 - 10.5 K/uL   RBC 3.90 3.87 - 5.11 MIL/uL   Hemoglobin 11.8 (L) 12.0 - 15.0 g/dL   HCT 35.8 (L) 36.0 - 46.0 %   MCV 91.8 80.0 - 100.0 fL   MCH 30.3 26.0 - 34.0 pg   MCHC 33.0 30.0 - 36.0 g/dL   RDW 15.5 11.5 - 15.5 %   Platelets 295 150 - 400 K/uL   nRBC 0.0 0.0 - 0.2 %   Neutrophils Relative % 66 %   Neutro Abs 6.9 1.7 - 7.7 K/uL   Lymphocytes Relative 18 %   Lymphs Abs 1.9 0.7 - 4.0 K/uL   Monocytes Relative 9 %   Monocytes Absolute 0.9 0.1 - 1.0 K/uL   Eosinophils Relative 1 %   Eosinophils Absolute 0.1 0.0 - 0.5 K/uL   Basophils Relative 1 %   Basophils Absolute 0.1 0.0 - 0.1 K/uL   Immature Granulocytes 5 %   Abs Immature Granulocytes 0.55 (H) 0.00 - 0.07 K/uL    Comment: Performed at [facility], [address] [place], [place] [address]  Lipase, blood     Status: Abnormal   Collection Time: 06/14/22  5:05 PM  Result Value Ref Range   Lipase 66 (H) 11 - 51 U/L    Comment: Performed at [facility], [address] [place], [place] [address]  Comprehensive metabolic panel     Status: Abnormal   Collection Time: 06/15/22  3:53 AM  Result Value Ref Range   Sodium 135 135 - 145 mmol/L   Potassium 4.1 3.5 - 5.1 mmol/L    Comment: DELTA CHECK NOTED   Chloride 100 98 - 111 mmol/L   CO2 26 22 - 32 mmol/L   Glucose, Bld 103 (H) 70 - 99 mg/dL    Comment: Glucose reference range applies only to samples taken after fasting for at least 8 hours.   BUN 18 8 - 23 mg/dL   Creatinine, Ser 0.84 0.44 - 1.00 mg/dL   Calcium 8.6 (L) 8.9 - 10.3 mg/dL   Total Protein 6.0 (L) 6.5 - 8.1 g/dL   Albumin 2.4 (L) 3.5 -  5.0 g/dL   AST 66 (H) 15 - 41 U/L   ALT 103 (H) 0 - 44 U/L   Alkaline Phosphatase 122 38 - 126 U/L   Total Bilirubin 1.4 (H) 0.3 - 1.2 mg/dL   GFR, Estimated >60 >60 mL/min    Comment: (NOTE) Calculated using the CKD-EPI Creatinine Equation (2021)    Anion gap 9 5 - 15    Comment: Performed at [facility], [address] [place], [place]  [address]  Magnesium     Status: None   Collection Time: 06/15/22  3:53 AM  Result Value Ref Range   Magnesium 1.9 1.7 - 2.4 mg/dL    Comment: Performed at [facility], [address], [place], [place] [address]  CBC with Differential/Platelet     Status: Abnormal   Collection Time: 06/15/22  3:53 AM  Result Value Ref Range   WBC 9.8 4.0 - 10.5 K/uL   RBC 3.45 (L) 3.87 - 5.11 MIL/uL   Hemoglobin 10.5 (L) 12.0 - 15.0 g/dL   HCT 32.0 (L) 36.0 - 46.0 %   MCV 92.8 80.0 - 100.0 fL   MCH 30.4 26.0 - 34.0 pg   MCHC 32.8 30.0 - 36.0 g/dL   RDW 15.4 11.5 - 15.5 %   Platelets 287 150 - 400 K/uL   nRBC 0.0 0.0 - 0.2 %   Neutrophils Relative % 70 %   Neutro Abs 6.8 1.7 - 7.7 K/uL   Lymphocytes Relative 16 %   Lymphs Abs 1.6 0.7 - 4.0 K/uL   Monocytes Relative 8 %   Monocytes Absolute 0.8 0.1 - 1.0 K/uL   Eosinophils Relative 1 %   Eosinophils Absolute 0.1 0.0 - 0.5 K/uL   Basophils Relative 0 %   Basophils Absolute 0.0 0.0 - 0.1 K/uL   Immature Granulocytes 5 %   Abs Immature Granulocytes 0.51 (H) 0.00 - 0.07 K/uL    Comment: Performed at [facility], [address] [place], [place] [address]  Glucose, capillary     Status: None   Collection Time: 06/15/22  7:25 AM  Result Value Ref Range   Glucose-Capillary 96 70 - 99 mg/dL    Comment: Glucose reference range applies only to samples taken after fasting for at least 8 hours.    CT ABDOMEN PELVIS W CONTRAST  Result Date: 06/14/2022 CLINICAL DATA:  Biliary obstruction suspected.  Pain. EXAM: CT ABDOMEN AND PELVIS WITH CONTRAST TECHNIQUE: Multidetector CT imaging of the abdomen and pelvis was performed using the standard protocol following bolus administration of intravenous contrast. RADIATION DOSE REDUCTION: This exam was performed according to the departmental dose-optimization program which includes automated exposure control, adjustment of the mA and/or kV according to patient size and/or use of iterative reconstruction technique.  CONTRAST:  154m OMNIPAQUE IOHEXOL 300 MG/ML  SOLN COMPARISON:  CTA abdomen and pelvis 11/28/2020 FINDINGS: Lower chest: There are trace bilateral pleural effusions. There is atelectasis in the lung bases. Hepatobiliary: There is gallbladder wall thickening with surrounding inflammatory stranding. A gallstone is again seen measuring 4 cm. Percutaneous cholecystostomy tube is in place coiled within the gallbladder. There is some questionable wall discontinuity along the inferior margin of the gallbladder seen on coronal image 5/47. No discrete separate fluid collection identified. No biliary ductal dilatation. Liver is within normal limits. Pancreas: Unremarkable. No pancreatic ductal dilatation or surrounding inflammatory changes. Spleen: Normal in size without focal abnormality. Adrenals/Urinary Tract: There is no hydronephrosis or perinephric stranding. There is a punctate 2 mm calculus in  the superior pole the left kidney. Left renal cysts are present measuring up to 1.6 cm. There are subcentimeter cortical hypodensities in the right kidney which are also likely cysts. Rounded mildly hyperdense area in the right kidney measuring 12 mm appears unchanged from the prior study. The adrenal glands and bladder are within normal limits. Stomach/Bowel: Stomach is within normal limits. Appendix appears normal. No evidence of bowel wall thickening, distention, or inflammatory changes. There is sigmoid colon diverticulosis. Vascular/Lymphatic: There are atherosclerotic calcifications of the aorta. Aorta is normal in size. There are nonenlarged periportal lymph nodes. No enlarged lymph nodes are identified. Reproductive: Right adnexal cystic area measuring 2.6 by 2.7 cm is likely related to the right ovary and appears new from prior examination. Left ovary and uterus are unremarkable. Other: There is no ascites or free intraperitoneal air. No focal abdominal wall hernia. There is a small amount of subcutaneous air in the  lower right anterior abdominal wall which may be related to medication injection site. Musculoskeletal: Right hip arthroplasty is present. Multilevel degenerative changes affect the spine. IMPRESSION: 1. Cholecystostomy tube is in place. Gallbladder wall thickening with surrounding inflammatory stranding compatible with cholecystitis. There is some questionable wall discontinuity along the inferior margin of the gallbladder worrisome for gallbladder perforation. No discrete fluid collection identified. 2. Trace bilateral pleural effusions. 3. Nonobstructing left renal calculus. 4. Bilateral renal cysts. There is a hyperdense lesion in the right kidney which is indeterminate. 5. 2.7 cm right ovarian simple-appearing cyst. Recommend prompt follow-up with pelvic US. Electronically Signed   By: Ronney Asters M.D.   On: 06/14/2022 18:43   DG Abdomen Acute W/Chest  Result Date: 06/14/2022 CLINICAL DATA:  Shortness of breath with abdomen pain EXAM: DG ABDOMEN ACUTE WITH 1 VIEW CHEST COMPARISON:  10/26/2021 FINDINGS: Single-view chest demonstrates left-sided pacing device. No acute airspace disease. Normal cardiac size. Supine and upright views of the abdomen demonstrate no free air beneath the diaphragm. Nonobstructed gas pattern with moderate right colonic stool. Right upper quadrant drainage catheter. Right hip replacement. IMPRESSION: 1. No radiographic evidence for acute cardiopulmonary abnormality 2. Nonobstructed gas pattern Electronically Signed   By: Donavan Foil M.D.   On: 06/14/2022 17:37    ROS:  Pertinent items are noted in HPI.  Blood pressure (!) 130/55, pulse 69, temperature 97.6 F (36.4 C), temperature source Oral, resp. rate 16, height 5' 5"  (1.651 m), weight 77.1 kg, SpO2 97 %. Physical Exam: Very pleasant well-developed well-nourished white female in no acute distress Head is normocephalic, atraumatic Eyes are without scleral icterus Lungs are clear to auscultation with equal breath  sounds bilaterally Heart examination reveals regular rate and rhythm.  The pacemaker is in place in the left upper chest. Abdomen is soft, nontender, nondistended.  Patient does have a cholecystostomy tube in place in the right upper quadrant.  CT scan images personally reviewed  Assessment/Plan: Impression: Acute cholecystitis with cholelithiasis, status post cholecystostomy tube placement in [facility].  CT findings are suggestive of a possible disruptors wall of the gallbladder consistent with possible necrosis.  Clinically, she is not septic and the cholecystostomy tube is controlling the bilious drainage.  At this point, there is no need for acute surgical intervention.  I will advance her diet and anticipate discharge in the next 24 to 48 hours pending her blood test.  She will need IR follow-up in 1 to 2 weeks for cholecystostomy tube check.  The decision on when and if to undergo a cholecystostomy tube will be  made at a future date.  Aviva Signs 06/15/2022, 9:53 AM

## 2022-06-16 ENCOUNTER — Inpatient Hospital Stay (HOSPITAL_COMMUNITY): Payer: Medicare Other

## 2022-06-16 DIAGNOSIS — K810 Acute cholecystitis: Secondary | ICD-10-CM | POA: Diagnosis not present

## 2022-06-16 LAB — CBC
HCT: 31 % — ABNORMAL LOW (ref 36.0–46.0)
Hemoglobin: 10.4 g/dL — ABNORMAL LOW (ref 12.0–15.0)
MCH: 30.5 pg (ref 26.0–34.0)
MCHC: 33.5 g/dL (ref 30.0–36.0)
MCV: 90.9 fL (ref 80.0–100.0)
Platelets: 349 10*3/uL (ref 150–400)
RBC: 3.41 MIL/uL — ABNORMAL LOW (ref 3.87–5.11)
RDW: 15.4 % (ref 11.5–15.5)
WBC: 9 10*3/uL (ref 4.0–10.5)
nRBC: 0 % (ref 0.0–0.2)

## 2022-06-16 LAB — COMPREHENSIVE METABOLIC PANEL
ALT: 111 U/L — ABNORMAL HIGH (ref 0–44)
AST: 78 U/L — ABNORMAL HIGH (ref 15–41)
Albumin: 2.4 g/dL — ABNORMAL LOW (ref 3.5–5.0)
Alkaline Phosphatase: 139 U/L — ABNORMAL HIGH (ref 38–126)
Anion gap: 7 (ref 5–15)
BUN: 18 mg/dL (ref 8–23)
CO2: 26 mmol/L (ref 22–32)
Calcium: 8.4 mg/dL — ABNORMAL LOW (ref 8.9–10.3)
Chloride: 103 mmol/L (ref 98–111)
Creatinine, Ser: 1.03 mg/dL — ABNORMAL HIGH (ref 0.44–1.00)
GFR, Estimated: 49 mL/min — ABNORMAL LOW (ref >60)
Glucose, Bld: 105 mg/dL — ABNORMAL HIGH (ref 70–99)
Potassium: 4.1 mmol/L (ref 3.5–5.1)
Sodium: 136 mmol/L (ref 135–145)
Total Bilirubin: 1.3 mg/dL — ABNORMAL HIGH (ref 0.3–1.2)
Total Protein: 6 g/dL — ABNORMAL LOW (ref 6.5–8.1)

## 2022-06-16 LAB — GLUCOSE, CAPILLARY
Glucose-Capillary: 117 mg/dL — ABNORMAL HIGH (ref 70–99)
Glucose-Capillary: 163 mg/dL — ABNORMAL HIGH (ref 70–99)
Glucose-Capillary: 197 mg/dL — ABNORMAL HIGH (ref 70–99)
Glucose-Capillary: 129 mg/dL — ABNORMAL HIGH (ref 70–99)

## 2022-06-16 LAB — HEMOGLOBIN A1C
Hgb A1c MFr Bld: 6.7 % — ABNORMAL HIGH (ref 4.8–5.6)
Mean Plasma Glucose: 145.59 mg/dL

## 2022-06-16 MED ORDER — POLYETHYLENE GLYCOL 3350 17 G PO PACK
17.0000 g | PACK | Freq: Two times a day (BID) | ORAL | Status: DC
  Administered 2022-06-16 – 2022-06-18 (×4): 17 g via ORAL
  Filled 2022-06-16 (×4): qty 1

## 2022-06-16 MED ORDER — SENNOSIDES-DOCUSATE SODIUM 8.6-50 MG PO TABS
1.0000 | ORAL_TABLET | Freq: Two times a day (BID) | ORAL | Status: DC
  Administered 2022-06-16 – 2022-06-18 (×4): 1 via ORAL
  Filled 2022-06-16 (×4): qty 1

## 2022-06-16 MED ORDER — BISACODYL 10 MG RE SUPP
10.0000 mg | Freq: Once | RECTAL | Status: AC
  Administered 2022-06-16: 10 mg via RECTAL
  Filled 2022-06-16: qty 1

## 2022-06-16 NOTE — Progress Notes (Signed)
Subjective: Patient denies any significant abdominal pain.  She does still feel little bloated along the upper portion of her abdomen.  She denies any nausea or vomiting.  She is moving her bowels.  Objective: Vital signs in last 24 hours: Temp:  [97.7 F (36.5 C)-98.9 F (37.2 C)] 97.9 F (36.6 C) (09/29 0551) Pulse Rate:  [76-87] 76 (09/29 0551) Resp:  [17-18] 18 (09/29 0551) BP: (137-138)/(48-67) 137/67 (09/29 0551) SpO2:  [97 %-99 %] 97 % (09/29 0551) Last BM Date : 06/13/22  Intake/Output from previous day: 09/28 0701 - 09/29 0700 In: 960 [P.O.:960] Out: 3030 [Urine:2950; Drains:80] Intake/Output this shift: Total I/O In: 240 [P.O.:240] Out: 400 [Urine:400]  General appearance: alert, cooperative, and no distress GI: Soft, slightly distended but not tense.  Cholecystostomy tube in place right upper quadrant with bilious drainage present.  No rigidity is noted.  Lab Results:  Recent Labs    06/15/22 0353 06/16/22 0350  WBC 9.8 9.0  HGB 10.5* 10.4*  HCT 32.0* 31.0*  PLT 287 349   BMET Recent Labs    06/15/22 0353 06/16/22 0354  NA 135 136  K 4.1 4.1  CL 100 103  CO2 26 26  GLUCOSE 103* 105*  BUN 18 18  CREATININE 0.84 1.03*  CALCIUM 8.6* 8.4*   PT/INR No results for input(s): "LABPROT", "INR" in the last 72 hours.  Studies/Results: US Venous Img Lower Bilateral (DVT)  Result Date: 06/15/2022 CLINICAL DATA:  Bilateral edema EXAM: BILATERAL LOWER EXTREMITY VENOUS DOPPLER ULTRASOUND TECHNIQUE: Gray-scale sonography with compression, as well as color and duplex ultrasound, were performed to evaluate the deep venous system(s) from the level of the common femoral vein through the popliteal and proximal calf veins. COMPARISON:  None Available. FINDINGS: VENOUS Normal compressibility of the common femoral, superficial femoral, and popliteal veins, as well as the visualized calf veins. Visualized portions of profunda femoral vein and great saphenous vein  unremarkable. No filling defects to suggest DVT on grayscale or color Doppler imaging. Doppler waveforms show normal direction of venous flow, normal respiratory plasticity and response to augmentation. OTHER None. Limitations: none IMPRESSION: Negative. Electronically Signed   By: Lucrezia Europe M.D.   On: 06/15/2022 15:02   CT ABDOMEN PELVIS W CONTRAST  Result Date: 06/14/2022 CLINICAL DATA:  Biliary obstruction suspected.  Pain. EXAM: CT ABDOMEN AND PELVIS WITH CONTRAST TECHNIQUE: Multidetector CT imaging of the abdomen and pelvis was performed using the standard protocol following bolus administration of intravenous contrast. RADIATION DOSE REDUCTION: This exam was performed according to the departmental dose-optimization program which includes automated exposure control, adjustment of the mA and/or kV according to patient size and/or use of iterative reconstruction technique. CONTRAST:  124m OMNIPAQUE IOHEXOL 300 MG/ML  SOLN COMPARISON:  CTA abdomen and pelvis 11/28/2020 FINDINGS: Lower chest: There are trace bilateral pleural effusions. There is atelectasis in the lung bases. Hepatobiliary: There is gallbladder wall thickening with surrounding inflammatory stranding. A gallstone is again seen measuring 4 cm. Percutaneous cholecystostomy tube is in place coiled within the gallbladder. There is some questionable wall discontinuity along the inferior margin of the gallbladder seen on coronal image 5/47. No discrete separate fluid collection identified. No biliary ductal dilatation. Liver is within normal limits. Pancreas: Unremarkable. No pancreatic ductal dilatation or surrounding inflammatory changes. Spleen: Normal in size without focal abnormality. Adrenals/Urinary Tract: There is no hydronephrosis or perinephric stranding. There is a punctate 2 mm calculus in the superior pole the left kidney. Left renal cysts are present measuring up to  1.6 cm. There are subcentimeter cortical hypodensities in the right  kidney which are also likely cysts. Rounded mildly hyperdense area in the right kidney measuring 12 mm appears unchanged from the prior study. The adrenal glands and bladder are within normal limits. Stomach/Bowel: Stomach is within normal limits. Appendix appears normal. No evidence of bowel wall thickening, distention, or inflammatory changes. There is sigmoid colon diverticulosis. Vascular/Lymphatic: There are atherosclerotic calcifications of the aorta. Aorta is normal in size. There are nonenlarged periportal lymph nodes. No enlarged lymph nodes are identified. Reproductive: Right adnexal cystic area measuring 2.6 by 2.7 cm is likely related to the right ovary and appears new from prior examination. Left ovary and uterus are unremarkable. Other: There is no ascites or free intraperitoneal air. No focal abdominal wall hernia. There is a small amount of subcutaneous air in the lower right anterior abdominal wall which may be related to medication injection site. Musculoskeletal: Right hip arthroplasty is present. Multilevel degenerative changes affect the spine. IMPRESSION: 1. Cholecystostomy tube is in place. Gallbladder wall thickening with surrounding inflammatory stranding compatible with cholecystitis. There is some questionable wall discontinuity along the inferior margin of the gallbladder worrisome for gallbladder perforation. No discrete fluid collection identified. 2. Trace bilateral pleural effusions. 3. Nonobstructing left renal calculus. 4. Bilateral renal cysts. There is a hyperdense lesion in the right kidney which is indeterminate. 5. 2.7 cm right ovarian simple-appearing cyst. Recommend prompt follow-up with pelvic US. Electronically Signed   By: Ronney Asters M.D.   On: 06/14/2022 18:43   DG Abdomen Acute W/Chest  Result Date: 06/14/2022 CLINICAL DATA:  Shortness of breath with abdomen pain EXAM: DG ABDOMEN ACUTE WITH 1 VIEW CHEST COMPARISON:  10/26/2021 FINDINGS: Single-view chest  demonstrates left-sided pacing device. No acute airspace disease. Normal cardiac size. Supine and upright views of the abdomen demonstrate no free air beneath the diaphragm. Nonobstructed gas pattern with moderate right colonic stool. Right upper quadrant drainage catheter. Right hip replacement. IMPRESSION: 1. No radiographic evidence for acute cardiopulmonary abnormality 2. Nonobstructed gas pattern Electronically Signed   By: Donavan Foil M.D.   On: 06/14/2022 17:37    Anti-infectives: Anti-infectives (From admission, onward)    Start     Dose/Rate Route Frequency Ordered Stop   06/14/22 2200  piperacillin-tazobactam (ZOSYN) IVPB 3.375 g        3.375 g 12.5 mL/hr over 240 Minutes Intravenous Every 8 hours 06/14/22 1907         Assessment/Plan: Impression: Acute cholecystitis, status post cholecystostomy tube placement at another facility.  LFTs still slightly elevated, but not worsening.  No leukocytosis present.  Patient tolerating advancement of diet well. Plan: We will see how the patient does over the next 24 hours.  Should she continue to improve, she may be discharged.  Any surgical intervention would be delayed for at least 4 to 6 weeks.  She understands this.  Full recommendations will be made in the morning.  LOS: 2 days    Aviva Signs 06/16/2022

## 2022-06-16 NOTE — Progress Notes (Signed)
PROGRESS NOTE  Denise Parrish  EAV:[id number] DOB: March 02, 1923 DOA: 06/14/2022 PCP: [name], MD   Brief Narrative:  Patient is a 88 year old female with history of anxiety, GERD, hyperlipidemia, hypertension, status post TAVR who presents to the emergency department for evaluation of right upper quadrant drain.  She was back in [facility] where she developed nausea, vomiting, abdominal pain and was diagnosed with acute cholecystitis in the regional hospital, was placed a percutaneous drain, discharged on Augmentin and was recommended to follow-up with general surgery at home.  On presentation,she  was complaining of pain on the drain site.  General surgery consulted and following.  Currently injection.  Plan is to discharge her home tomorrow with oral antibiotics.   Assessment & Plan:  Principal Problem:   Acute cholecystitis Active Problems:   Diabetes mellitus, type 2 (HCC)   Gastroesophageal reflux disease   Essential hypertension   Abnormal LFTs   Calculus of gallbladder with acute cholecystitis without obstruction   Acute cholecystitis: Recently diagnosed with cholecystitis while at vacation at [facility].  Who was treated with IV antibiotics, drain placement, discharged with Augmentin.  Patient presented with increased pain in the right upper quadrant. Zosyn  started here.  General surgery consulted.  Drain functioning.  Diet has been continued.  CT abdomen shows inflammation around the gallbladder and possible perforation /necrotic changes.  Also shows a 4 cm gallstone.  Her abdomen is benign on examination.   General surgery not planning for surgical intervention.  General surgery recommends outpatient  follow-up in 1 to 2 weeks for drain removal and subsequent plan for cholecystectomy . We will change antibiotics to Augmentin on discharge, plan for 10 to 14 days course from the beginning   Elevated liver enzymes: Mildly elevated, statin  on hold.  Continue to trend    Hypertension: Continue metoprolol   GERD: Continue PPI   Normocytic anemia: Currently hemoglobin stable in the range of 10   Diabetes type 2: Continue sliding scale insulin.  Glimepiride on hold.  Monitor blood sugars         DVT prophylaxis:heparin injection 5,000 Units Start: 06/14/22 2200 SCDs Start: 06/14/22 2011     Code Status: Full Code  Family Communication: called and discussed with daughter on phone on 9/28  Patient status: Inpatient  Patient is from :Home  Anticipated discharge NW:GNFA  Estimated DC date:tomorrow   Consultants: General surgery  Procedures: None  Antimicrobials:  Anti-infectives (From admission, onward)    Start     Dose/Rate Route Frequency Ordered Stop   06/14/22 2200  piperacillin-tazobactam (ZOSYN) IVPB 3.375 g        3.375 g 12.5 mL/hr over 240 Minutes Intravenous Every 8 hours 06/14/22 1907         Subjective:   Objective: Vitals:   06/15/22 0833 06/15/22 1316 06/15/22 2119 06/16/22 0551  BP: (!) 130/55 (!) 138/57 (!) 137/48 137/67  Pulse: 69 81 87 76  Resp: 16 17 18 18   Temp: 97.6 F (36.4 C) 97.7 F (36.5 C) 98.9 F (37.2 C) 97.9 F (36.6 C)  TempSrc: Oral Oral  Oral  SpO2: 97% 99% 97% 97%  Weight:      Height:        Intake/Output Summary (Last 24 hours) at 06/16/2022 1135 Last data filed at 06/16/2022 0900 Gross per 24 hour  Intake 960 ml  Output 3430 ml  Net -2470 ml   Filed Weights   06/14/22 1358  Weight: 77.1 kg    Examination:  General exam: Overall comfortable, not in distress, pleasant elderly female HEENT: PERRL Respiratory system:  no wheezes or crackles  Cardiovascular system: S1 & S2 heard, RRR.  Gastrointestinal system: Abdomen is mildly distended, soft and nontender.  Right upper quadrant drain Central nervous system: Alert and oriented Extremities: trace lower extremity edema, no clubbing ,no cyanosis Skin: No rashes, no ulcers,no icterus     Data Reviewed: I have personally  reviewed following labs and imaging studies  CBC: Recent Labs  Lab 06/14/22 1705 06/15/22 0353 06/16/22 0350  WBC 10.4 9.8 9.0  NEUTROABS 6.9 6.8  --   HGB 11.8* 10.5* 10.4*  HCT 35.8* 32.0* 31.0*  MCV 91.8 92.8 90.9  PLT 295 287 170   Basic Metabolic Panel: Recent Labs  Lab 06/14/22 1705 06/15/22 0353 06/16/22 0354  NA 137 135 136  K 5.1 4.1 4.1  CL 100 100 103  CO2 27 26 26   GLUCOSE 110* 103* 105*  BUN 19 18 18   CREATININE 0.83 0.84 1.03*  CALCIUM 9.0 8.6* 8.4*  MG  --  1.9  --      No results found for this or any previous visit (from the past 240 hour(s)).   Radiology Studies: US Venous Img Lower Bilateral (DVT)  Result Date: 06/15/2022 CLINICAL DATA:  Bilateral edema EXAM: BILATERAL LOWER EXTREMITY VENOUS DOPPLER ULTRASOUND TECHNIQUE: Gray-scale sonography with compression, as well as color and duplex ultrasound, were performed to evaluate the deep venous system(s) from the level of the common femoral vein through the popliteal and proximal calf veins. COMPARISON:  None Available. FINDINGS: VENOUS Normal compressibility of the common femoral, superficial femoral, and popliteal veins, as well as the visualized calf veins. Visualized portions of profunda femoral vein and great saphenous vein unremarkable. No filling defects to suggest DVT on grayscale or color Doppler imaging. Doppler waveforms show normal direction of venous flow, normal respiratory plasticity and response to augmentation. OTHER None. Limitations: none IMPRESSION: Negative. Electronically Signed   By: Lucrezia Europe M.D.   On: 06/15/2022 15:02   CT ABDOMEN PELVIS W CONTRAST  Result Date: 06/14/2022 CLINICAL DATA:  Biliary obstruction suspected.  Pain. EXAM: CT ABDOMEN AND PELVIS WITH CONTRAST TECHNIQUE: Multidetector CT imaging of the abdomen and pelvis was performed using the standard protocol following bolus administration of intravenous contrast. RADIATION DOSE REDUCTION: This exam was performed according  to the departmental dose-optimization program which includes automated exposure control, adjustment of the mA and/or kV according to patient size and/or use of iterative reconstruction technique. CONTRAST:  157m OMNIPAQUE IOHEXOL 300 MG/ML  SOLN COMPARISON:  CTA abdomen and pelvis 11/28/2020 FINDINGS: Lower chest: There are trace bilateral pleural effusions. There is atelectasis in the lung bases. Hepatobiliary: There is gallbladder wall thickening with surrounding inflammatory stranding. A gallstone is again seen measuring 4 cm. Percutaneous cholecystostomy tube is in place coiled within the gallbladder. There is some questionable wall discontinuity along the inferior margin of the gallbladder seen on coronal image 5/47. No discrete separate fluid collection identified. No biliary ductal dilatation. Liver is within normal limits. Pancreas: Unremarkable. No pancreatic ductal dilatation or surrounding inflammatory changes. Spleen: Normal in size without focal abnormality. Adrenals/Urinary Tract: There is no hydronephrosis or perinephric stranding. There is a punctate 2 mm calculus in the superior pole the left kidney. Left renal cysts are present measuring up to 1.6 cm. There are subcentimeter cortical hypodensities in the right kidney which are also likely cysts. Rounded mildly hyperdense area in the right kidney measuring 12 mm appears unchanged  from the prior study. The adrenal glands and bladder are within normal limits. Stomach/Bowel: Stomach is within normal limits. Appendix appears normal. No evidence of bowel wall thickening, distention, or inflammatory changes. There is sigmoid colon diverticulosis. Vascular/Lymphatic: There are atherosclerotic calcifications of the aorta. Aorta is normal in size. There are nonenlarged periportal lymph nodes. No enlarged lymph nodes are identified. Reproductive: Right adnexal cystic area measuring 2.6 by 2.7 cm is likely related to the right ovary and appears new from prior  examination. Left ovary and uterus are unremarkable. Other: There is no ascites or free intraperitoneal air. No focal abdominal wall hernia. There is a small amount of subcutaneous air in the lower right anterior abdominal wall which may be related to medication injection site. Musculoskeletal: Right hip arthroplasty is present. Multilevel degenerative changes affect the spine. IMPRESSION: 1. Cholecystostomy tube is in place. Gallbladder wall thickening with surrounding inflammatory stranding compatible with cholecystitis. There is some questionable wall discontinuity along the inferior margin of the gallbladder worrisome for gallbladder perforation. No discrete fluid collection identified. 2. Trace bilateral pleural effusions. 3. Nonobstructing left renal calculus. 4. Bilateral renal cysts. There is a hyperdense lesion in the right kidney which is indeterminate. 5. 2.7 cm right ovarian simple-appearing cyst. Recommend prompt follow-up with pelvic US. Electronically Signed   By: Ronney Asters M.D.   On: 06/14/2022 18:43   DG Abdomen Acute W/Chest  Result Date: 06/14/2022 CLINICAL DATA:  Shortness of breath with abdomen pain EXAM: DG ABDOMEN ACUTE WITH 1 VIEW CHEST COMPARISON:  10/26/2021 FINDINGS: Single-view chest demonstrates left-sided pacing device. No acute airspace disease. Normal cardiac size. Supine and upright views of the abdomen demonstrate no free air beneath the diaphragm. Nonobstructed gas pattern with moderate right colonic stool. Right upper quadrant drainage catheter. Right hip replacement. IMPRESSION: 1. No radiographic evidence for acute cardiopulmonary abnormality 2. Nonobstructed gas pattern Electronically Signed   By: Donavan Foil M.D.   On: 06/14/2022 17:37    Scheduled Meds:  ALPRAZolam  0.5 mg Oral QHS   clopidogrel  75 mg Oral Daily   heparin  5,000 Units Subcutaneous Q8H   insulin aspart  0-15 Units Subcutaneous TID WC   insulin aspart  0-5 Units Subcutaneous QHS   metoprolol  tartrate  12.5 mg Oral BID   pantoprazole  40 mg Oral Daily   Continuous Infusions:  piperacillin-tazobactam (ZOSYN)  IV 3.375 g (06/16/22 0547)     LOS: 2 days   Shelly Coss, MD [facility] P9/29/2023, 11:35 AM

## 2022-06-16 NOTE — Care Management Important Message (Signed)
Important Message  Patient Details  Name: Denise Parrish MRN: [id number] Date of Birth: [birth year]   Medicare Important Message Given:  Yes     Tommy Medal 06/16/2022, 11:15 AM

## 2022-06-17 DIAGNOSIS — K8000 Calculus of gallbladder with acute cholecystitis without obstruction: Secondary | ICD-10-CM | POA: Diagnosis not present

## 2022-06-17 LAB — COMPREHENSIVE METABOLIC PANEL
ALT: 99 U/L — ABNORMAL HIGH (ref 0–44)
AST: 53 U/L — ABNORMAL HIGH (ref 15–41)
Albumin: 2.5 g/dL — ABNORMAL LOW (ref 3.5–5.0)
Alkaline Phosphatase: 131 U/L — ABNORMAL HIGH (ref 38–126)
Anion gap: 7 (ref 5–15)
BUN: 16 mg/dL (ref 8–23)
CO2: 27 mmol/L (ref 22–32)
Calcium: 8.4 mg/dL — ABNORMAL LOW (ref 8.9–10.3)
Chloride: 103 mmol/L (ref 98–111)
Creatinine, Ser: 1.1 mg/dL — ABNORMAL HIGH (ref 0.44–1.00)
GFR, Estimated: 46 mL/min — ABNORMAL LOW (ref >60)
Glucose, Bld: 118 mg/dL — ABNORMAL HIGH (ref 70–99)
Potassium: 4.4 mmol/L (ref 3.5–5.1)
Sodium: 137 mmol/L (ref 135–145)
Total Bilirubin: 1.4 mg/dL — ABNORMAL HIGH (ref 0.3–1.2)
Total Protein: 6.3 g/dL — ABNORMAL LOW (ref 6.5–8.1)

## 2022-06-17 LAB — CBC
HCT: 32.5 % — ABNORMAL LOW (ref 36.0–46.0)
Hemoglobin: 10.6 g/dL — ABNORMAL LOW (ref 12.0–15.0)
MCH: 30.1 pg (ref 26.0–34.0)
MCHC: 32.6 g/dL (ref 30.0–36.0)
MCV: 92.3 fL (ref 80.0–100.0)
Platelets: 398 10*3/uL (ref 150–400)
RBC: 3.52 MIL/uL — ABNORMAL LOW (ref 3.87–5.11)
RDW: 15.2 % (ref 11.5–15.5)
WBC: 10.3 10*3/uL (ref 4.0–10.5)
nRBC: 0 % (ref 0.0–0.2)

## 2022-06-17 LAB — GLUCOSE, CAPILLARY
Glucose-Capillary: 119 mg/dL — ABNORMAL HIGH (ref 70–99)
Glucose-Capillary: 144 mg/dL — ABNORMAL HIGH (ref 70–99)
Glucose-Capillary: 128 mg/dL — ABNORMAL HIGH (ref 70–99)
Glucose-Capillary: 129 mg/dL — ABNORMAL HIGH (ref 70–99)

## 2022-06-17 MED ORDER — MILK AND MOLASSES ENEMA
1.0000 | Freq: Once | RECTAL | Status: AC
  Administered 2022-06-17: 240 mL via RECTAL

## 2022-06-17 NOTE — Progress Notes (Signed)
PROGRESS NOTE  Denise Parrish  GBT:[id number] DOB: 12/02/1922 DOA: 06/14/2022 PCP: [name], MD   Brief Narrative:  Patient is a 88 year old female with history of anxiety, GERD, hyperlipidemia, hypertension, status post TAVR who presents to the emergency department for evaluation of right upper quadrant drain.  She was back in [facility] where she developed nausea, vomiting, abdominal pain and was diagnosed with acute cholecystitis in the regional hospital, was placed a percutaneous drain, discharged on Augmentin and was recommended to follow-up with general surgery at home.  On presentation,she  was complaining of pain on the drain site.  General surgery consulted and following.  Currently injection.  Plan is to discharge her home tomorrow with oral antibiotics.   Assessment & Plan:  Principal Problem:   Acute cholecystitis Active Problems:   Diabetes mellitus, type 2 (HCC)   Gastroesophageal reflux disease   Essential hypertension   Abnormal LFTs   Calculus of gallbladder with acute cholecystitis without obstruction   Acute cholecystitis: Recently diagnosed with cholecystitis while at vacation at [facility].  Who was treated with IV antibiotics, drain placement, discharged with Augmentin.  Patient presented with increased pain in the right upper quadrant.  Continue IV Zosyn.  General surgery consulted.  Drain functioning.  Diet has been continued.  CT abdomen shows inflammation around the gallbladder and possible perforation /necrotic changes.  Also shows a 4 cm gallstone.  Her abdomen is benign on examination.   General surgery not planning for surgical intervention.  General surgery recommends outpatient  follow-up in 1 to 2 weeks for drain removal and subsequent plan for cholecystectomy . Tentatively planning oral augmentin on discharge, plan for 10 to 14 days course from the beginning   Elevated liver enzymes: Mildly elevated, statin  on hold.  Continue to trend    Hypertension: Continue metoprolol   GERD: Continue PPI   Normocytic anemia: Currently hemoglobin stable in the range of 10   Diabetes type 2: Continue sliding scale insulin.  Glimepiride on hold.  Monitor blood sugars CBG (last 3)  Recent Labs    06/16/22 2157 06/17/22 0748 06/17/22 1152  GLUCAP 129* 119* 144*        DVT prophylaxis:heparin injection 5,000 Units Start: 06/14/22 2200 SCDs Start: 06/14/22 2011     Code Status: Full Code  Family Communication:   Patient status: Inpatient  Patient is from :Home  Anticipated discharge XT:GGYI  Consultants: General surgery  Procedures: None  Antimicrobials:  Anti-infectives (From admission, onward)    Start     Dose/Rate Route Frequency Ordered Stop   06/14/22 2200  piperacillin-tazobactam (ZOSYN) IVPB 3.375 g        3.375 g 12.5 mL/hr over 240 Minutes Intravenous Every 8 hours 06/14/22 1907         Subjective: Pt reports that she is feeling better today.    Objective: Vitals:   06/16/22 0551 06/16/22 1343 06/16/22 2154 06/17/22 0542  BP: 137/67 130/62 132/66 (!) 112/59  Pulse: 76 87 75 79  Resp: 18 20 18 18   Temp: 97.9 F (36.6 C) 98.1 F (36.7 C) 98.2 F (36.8 C) 98.4 F (36.9 C)  TempSrc: Oral Oral Oral Oral  SpO2: 97% 100% 93% 94%  Weight:      Height:        Intake/Output Summary (Last 24 hours) at 06/17/2022 1230 Last data filed at 06/17/2022 0543 Gross per 24 hour  Intake --  Output 920 ml  Net -920 ml   [employer]  06/14/22 1358  Weight: 77.1 kg    Examination:  General exam: frail, elderly female, awake, alert, cooperative.  HEENT: NCAT, neck supple.  Respiratory system:  no increased work of breathing.   Cardiovascular system: normal S1 & S2 heard.    Gastrointestinal system: Abdomen is soft and nontender.  Right upper quadrant drain with bilious output seen.  Central nervous system: Alert and oriented Extremities: trace lower extremity edema, no clubbing ,no  cyanosis Skin: No rashes, no ulcers,no icterus     Data Reviewed: I have personally reviewed following labs and imaging studies  CBC: Recent Labs  Lab 06/14/22 1705 06/15/22 0353 06/16/22 0350 06/17/22 0449  WBC 10.4 9.8 9.0 10.3  NEUTROABS 6.9 6.8  --   --   HGB 11.8* 10.5* 10.4* 10.6*  HCT 35.8* 32.0* 31.0* 32.5*  MCV 91.8 92.8 90.9 92.3  PLT 295 287 349 097   Basic Metabolic Panel: Recent Labs  Lab 06/14/22 1705 06/15/22 0353 06/16/22 0354 06/17/22 0449  NA 137 135 136 137  K 5.1 4.1 4.1 4.4  CL 100 100 103 103  CO2 27 26 26 27   GLUCOSE 110* 103* 105* 118*  BUN 19 18 18 16   CREATININE 0.83 0.84 1.03* 1.10*  CALCIUM 9.0 8.6* 8.4* 8.4*  MG  --  1.9  --   --      No results found for this or any previous visit (from the past 240 hour(s)).   Radiology Studies: DG Abd 1 View  Result Date: 06/16/2022 CLINICAL DATA:  Abdominal distension, pain from the drain site. EXAM: ABDOMEN - 1 VIEW COMPARISON:  June 14, 2022 FINDINGS: An artificial aortic valve is seen within the visualized portion of the lungs. The bowel gas pattern is normal. A moderate amount of stool is seen throughout the large bowel. A radiopaque biliary drainage catheter is seen overlying the right upper quadrant. This is stable in position when compared to the prior study. No radio-opaque calculi or other significant radiographic abnormality are seen. Intact total right hip replacement is noted. IMPRESSION: 1. Moderate stool burden without evidence of bowel obstruction. 2. Stable biliary drainage catheter positioning. Electronically Signed   By: Virgina Norfolk M.D.   On: 06/16/2022 17:53   US Venous Img Lower Bilateral (DVT)  Result Date: 06/15/2022 CLINICAL DATA:  Bilateral edema EXAM: BILATERAL LOWER EXTREMITY VENOUS DOPPLER ULTRASOUND TECHNIQUE: Gray-scale sonography with compression, as well as color and duplex ultrasound, were performed to evaluate the deep venous system(s) from the level of the  common femoral vein through the popliteal and proximal calf veins. COMPARISON:  None Available. FINDINGS: VENOUS Normal compressibility of the common femoral, superficial femoral, and popliteal veins, as well as the visualized calf veins. Visualized portions of profunda femoral vein and great saphenous vein unremarkable. No filling defects to suggest DVT on grayscale or color Doppler imaging. Doppler waveforms show normal direction of venous flow, normal respiratory plasticity and response to augmentation. OTHER None. Limitations: none IMPRESSION: Negative. Electronically Signed   By: Lucrezia Europe M.D.   On: 06/15/2022 15:02    Scheduled Meds:  ALPRAZolam  0.5 mg Oral QHS   clopidogrel  75 mg Oral Daily   heparin  5,000 Units Subcutaneous Q8H   insulin aspart  0-15 Units Subcutaneous TID WC   insulin aspart  0-5 Units Subcutaneous QHS   metoprolol tartrate  12.5 mg Oral BID   milk and molasses  1 enema Rectal Once   pantoprazole  40 mg Oral Daily   polyethylene  glycol  17 g Oral BID   senna-docusate  1 tablet Oral BID   Continuous Infusions:  piperacillin-tazobactam (ZOSYN)  IV 3.375 g (06/17/22 0545)     LOS: 3 days   Irwin Brakeman, MD [facility] P9/30/2023, 12:30 PM

## 2022-06-17 NOTE — Progress Notes (Signed)
  Subjective: Patient still with some mild abdominal bloating.  She had a small bowel movement yesterday.  She denies any nausea, vomiting, or abdominal pain.  Objective: Vital signs in last 24 hours: Temp:  [98.1 F (36.7 C)-98.4 F (36.9 C)] 98.4 F (36.9 C) (09/30 0542) Pulse Rate:  [75-87] 79 (09/30 0542) Resp:  [18-20] 18 (09/30 0542) BP: (112-132)/(59-66) 112/59 (09/30 0542) SpO2:  [93 %-100 %] 94 % (09/30 0542) Last BM Date : 06/13/22  Intake/Output from previous day: 09/29 0701 - 09/30 0700 In: 240 [P.O.:240] Out: 1320 [Urine:1000; Drains:320] Intake/Output this shift: No intake/output data recorded.  General appearance: alert, cooperative, and no distress GI: Soft, mildly distended.  Nontender.  Bilious drainage noted from cholecystostomy tube.  Lab Results:  Recent Labs    06/16/22 0350 06/17/22 0449  WBC 9.0 10.3  HGB 10.4* 10.6*  HCT 31.0* 32.5*  PLT 349 398   BMET Recent Labs    06/16/22 0354 06/17/22 0449  NA 136 137  K 4.1 4.4  CL 103 103  CO2 26 27  GLUCOSE 105* 118*  BUN 18 16  CREATININE 1.03* 1.10*  CALCIUM 8.4* 8.4*   PT/INR No results for input(s): "LABPROT", "INR" in the last 72 hours.  Studies/Results: DG Abd 1 View  Result Date: 06/16/2022 CLINICAL DATA:  Abdominal distension, pain from the drain site. EXAM: ABDOMEN - 1 VIEW COMPARISON:  June 14, 2022 FINDINGS: An artificial aortic valve is seen within the visualized portion of the lungs. The bowel gas pattern is normal. A moderate amount of stool is seen throughout the large bowel. A radiopaque biliary drainage catheter is seen overlying the right upper quadrant. This is stable in position when compared to the prior study. No radio-opaque calculi or other significant radiographic abnormality are seen. Intact total right hip replacement is noted. IMPRESSION: 1. Moderate stool burden without evidence of bowel obstruction. 2. Stable biliary drainage catheter positioning.  Electronically Signed   By: Virgina Norfolk M.D.   On: 06/16/2022 17:53   US Venous Img Lower Bilateral (DVT)  Result Date: 06/15/2022 CLINICAL DATA:  Bilateral edema EXAM: BILATERAL LOWER EXTREMITY VENOUS DOPPLER ULTRASOUND TECHNIQUE: Gray-scale sonography with compression, as well as color and duplex ultrasound, were performed to evaluate the deep venous system(s) from the level of the common femoral vein through the popliteal and proximal calf veins. COMPARISON:  None Available. FINDINGS: VENOUS Normal compressibility of the common femoral, superficial femoral, and popliteal veins, as well as the visualized calf veins. Visualized portions of profunda femoral vein and great saphenous vein unremarkable. No filling defects to suggest DVT on grayscale or color Doppler imaging. Doppler waveforms show normal direction of venous flow, normal respiratory plasticity and response to augmentation. OTHER None. Limitations: none IMPRESSION: Negative. Electronically Signed   By: Lucrezia Europe M.D.   On: 06/15/2022 15:02    Anti-infectives: Anti-infectives (From admission, onward)    Start     Dose/Rate Route Frequency Ordered Stop   06/14/22 2200  piperacillin-tazobactam (ZOSYN) IVPB 3.375 g        3.375 g 12.5 mL/hr over 240 Minutes Intravenous Every 8 hours 06/14/22 1907         Assessment/Plan: Impression: Acute cholecystitis, status post cholecystostomy tube placement.  Her transaminitis is slowly improving.  She does have a significant stool burden consistent with constipation.  We will give her an enema today.  LOS: 3 days    Aviva Signs 06/17/2022

## 2022-06-17 NOTE — Progress Notes (Signed)
Pharmacy Antibiotic Note  Denise Parrish is a 88 y.o. female admitted on 06/14/2022 with  intra-abdominal infection .  Pharmacy has been consulted for Zosyn dosing. Patient is afebrile with WBC of 10.4. CT abdomen is concerning for gallbladder perforation.   Renal function stable, current CrCl is 39.56m/min.   Plan: Zosyn 3.375g IV q8h (4 hour infusion). Follow culture data for de-escalation.  Monitor renal function for dose adjustments as indicated.    Height: 5' 5"  (165.1 cm) Weight: 77.1 kg (170 lb) IBW/kg (Calculated) : 57  Temp (24hrs), Avg:98.2 F (36.8 C), Min:98.1 F (36.7 C), Max:98.4 F (36.9 C)  Recent Labs  Lab 06/14/22 1705 06/15/22 0353 06/16/22 0350 06/16/22 0354 06/17/22 0449  WBC 10.4 9.8 9.0  --  10.3  CREATININE 0.83 0.84  --  1.03* 1.10*     Estimated Creatinine Clearance: 30 mL/min (A) (by C-G formula based on SCr of 1.1 mg/dL (H)).    No Known Allergies   Thank you for allowing pharmacy to be a part of this patient's care.  [name] 06/17/2022 10:36 AM

## 2022-06-17 NOTE — Progress Notes (Signed)
Patient complained of pain at start of my shift and was medicated per md order. Around midnight patient was sitting on the side of the bed complaining of leg cramps. Robaxin given. Patient slept after given this medication. This morning patient was up and ambulated to bathroom with standby assist.

## 2022-06-18 DIAGNOSIS — K8000 Calculus of gallbladder with acute cholecystitis without obstruction: Secondary | ICD-10-CM | POA: Diagnosis not present

## 2022-06-18 DIAGNOSIS — I10 Essential (primary) hypertension: Secondary | ICD-10-CM

## 2022-06-18 LAB — GLUCOSE, CAPILLARY
Glucose-Capillary: 124 mg/dL — ABNORMAL HIGH (ref 70–99)
Glucose-Capillary: 183 mg/dL — ABNORMAL HIGH (ref 70–99)

## 2022-06-18 MED ORDER — AMOXICILLIN-POT CLAVULANATE 875-125 MG PO TABS: 1.0000 | ORAL_TABLET | Freq: Two times a day (BID) | ORAL | 0 refills | Status: AC

## 2022-06-18 NOTE — Progress Notes (Signed)
  Subjective: Patient feels much better after enema.  Had significant bowel movements.  Objective: Vital signs in last 24 hours: Temp:  [97.6 F (36.4 C)-98.2 F (36.8 C)] 97.6 F (36.4 C) (10/01 0621) Pulse Rate:  [75-88] 84 (10/01 0621) Resp:  [18-20] 18 (10/01 0621) BP: (104-129)/(59-65) 119/65 (10/01 0621) SpO2:  [96 %-98 %] 98 % (10/01 0621) Last BM Date : 06/17/22  Intake/Output from previous day: 09/30 0701 - 10/01 0700 In: 720 [P.O.:720] Out: 225 [Drains:225] Intake/Output this shift: Total I/O In: -  Out: 50 [Drains:50]  General appearance: alert, cooperative, and no distress GI: soft, non-tender; bowel sounds normal; no masses,  no organomegaly and cholecystostomy tube with bilious drainage present.  Lab Results:  Recent Labs    06/16/22 0350 06/17/22 0449  WBC 9.0 10.3  HGB 10.4* 10.6*  HCT 31.0* 32.5*  PLT 349 398   BMET Recent Labs    06/16/22 0354 06/17/22 0449  NA 136 137  K 4.1 4.4  CL 103 103  CO2 26 27  GLUCOSE 105* 118*  BUN 18 16  CREATININE 1.03* 1.10*  CALCIUM 8.4* 8.4*   PT/INR No results for input(s): "LABPROT", "INR" in the last 72 hours.  Studies/Results: DG Abd 1 View  Result Date: 06/16/2022 CLINICAL DATA:  Abdominal distension, pain from the drain site. EXAM: ABDOMEN - 1 VIEW COMPARISON:  June 14, 2022 FINDINGS: An artificial aortic valve is seen within the visualized portion of the lungs. The bowel gas pattern is normal. A moderate amount of stool is seen throughout the large bowel. A radiopaque biliary drainage catheter is seen overlying the right upper quadrant. This is stable in position when compared to the prior study. No radio-opaque calculi or other significant radiographic abnormality are seen. Intact total right hip replacement is noted. IMPRESSION: 1. Moderate stool burden without evidence of bowel obstruction. 2. Stable biliary drainage catheter positioning. Electronically Signed   By: Virgina Norfolk M.D.    On: 06/16/2022 17:53    Anti-infectives: Anti-infectives (From admission, onward)    Start     Dose/Rate Route Frequency Ordered Stop   06/14/22 2200  piperacillin-tazobactam (ZOSYN) IVPB 3.375 g        3.375 g 12.5 mL/hr over 240 Minutes Intravenous Every 8 hours 06/14/22 1907         Assessment/Plan: Impression: Acute cholecystitis with cholelithiasis treated with cholecystostomy tube at another facility.  Constipation has resolved. Plan: Okay for discharge from surgery standpoint.  We will reassess for outpatient cholecystectomy.  Patient to follow-up in my office in 2 to 3 weeks.  Do recommend discharge on Augmentin for 5 to 7 days.  Patient is confused as to whether she still has her prescription from [facility].  Daughter not available at the present time.  LOS: 4 days    Aviva Signs 06/18/2022

## 2022-06-18 NOTE — Discharge Instructions (Signed)
IMPORTANT INFORMATION: PAY CLOSE ATTENTION   PHYSICIAN DISCHARGE INSTRUCTIONS  Follow with Primary care provider  Denise Andrea, MD  and other consultants as instructed by your Hospitalist Physician  [place] IF SYMPTOMS COME BACK, WORSEN OR NEW PROBLEM DEVELOPS   Please note: You were cared for by a hospitalist during your hospital stay. Every effort will be made to forward records to your primary care provider.  You can request that your primary care provider send for your hospital records if they have not received them.  Once you are discharged, your primary care physician will handle any further medical issues. Please note that NO REFILLS for any discharge medications will be authorized once you are discharged, as it is imperative that you return to your primary care physician (or establish a relationship with a primary care physician if you do not have one) for your post hospital discharge needs so that they can reassess your need for medications and monitor your lab values.  Please get a complete blood count and chemistry panel checked by your Primary MD at your next visit, and again as instructed by your Primary MD.  Get Medicines reviewed and adjusted: Please take all your medications with you for your next visit with your Primary MD  Laboratory/radiological data: Please request your Primary MD to go over all hospital tests and procedure/radiological results at the follow up, please ask your primary care provider to get all Hospital records sent to his/her office.  In some cases, they will be blood work, cultures and biopsy results pending at the time of your discharge. Please request that your primary care provider follow up on these results.  If you are diabetic, please bring your blood sugar readings with you to your follow up appointment with primary care.    Please call and make your follow up appointments as soon as possible.    Also  Note the following: If you experience worsening of your admission symptoms, develop shortness of breath, life threatening emergency, suicidal or homicidal thoughts you must seek medical attention immediately by calling 911 or calling your MD immediately  if symptoms less severe.  You must read complete instructions/literature along with all the possible adverse reactions/side effects for all the Medicines you take and that have been prescribed to you. Take any new Medicines after you have completely understood and accpet all the possible adverse reactions/side effects.   Do not drive when taking Pain medications or sleeping medications (Benzodiazepines)  Do not take more than prescribed Pain, Sleep and Anxiety Medications. It is not advisable to combine anxiety,sleep and pain medications without talking with your primary care practitioner  Special Instructions: If you have smoked or chewed Tobacco  in the last 2 yrs please stop smoking, stop any regular Alcohol  and or any Recreational drug use.  Wear Seat belts while driving.  Do not drive if taking any narcotic, mind altering or controlled substances or recreational drugs or alcohol.

## 2022-06-18 NOTE — Discharge Summary (Signed)
Physician Discharge Summary  Denise Parrish QIO:[id number] DOB: 1922-10-18 DOA: 06/14/2022  PCP: [name], MD Surgeon: Glori Bickers date: 06/14/2022 Discharge date: 06/18/2022  Admitted From: Home Disposition: Home   Recommendations for Outpatient Follow-up:  Follow up with Dr. Arnoldo Morale in 2-3 weeks Please recheck LFTs in 2-4 weeks Consider restarting atorvastatin if LFTs holding stable on retesting  Discharge Condition: STABLE   CODE STATUS: FULL DIET: FULL LIQUID    Brief Hospitalization Summary: Please see all hospital notes, images, labs for full details of the hospitalization. ADMISSION HPI: Patient is a 88 year old female with history of anxiety, GERD, hyperlipidemia, hypertension, status post TAVR who presents to the emergency department for evaluation of right upper quadrant drain.  She was back in [facility] where she developed nausea, vomiting, abdominal pain and was diagnosed with acute cholecystitis in the regional hospital, was placed a percutaneous drain, discharged on Augmentin and was recommended to follow-up with general surgery at home.  On presentation,she  was complaining of pain on the drain site.  General surgery consulted and following.  Currently injection.  Plan is to discharge her home tomorrow with oral antibiotics.  HOSPITAL COURSE BY PROBLEM  Acute cholecystitis: Recently diagnosed with cholecystitis while at vacation at [facility].  Who was treated with IV antibiotics, drain placement, discharged with Augmentin.  Patient presented with increased pain in the right upper quadrant.  Continue IV Zosyn.  General surgery consulted.  Drain functioning.  full liquid diet has been continued.  CT abdomen shows inflammation around the gallbladder and possible perforation /necrotic changes.  Also shows a 4 cm gallstone.  Her abdomen is benign on examination.   General surgery not planning for surgical intervention.  General surgery recommends outpatient   follow-up in 2 weeks for drain removal and subsequent plan for cholecystectomy.  Discussed with surgery, can DC home today on 5 days oral augmentin, follow up with surgery in 2-3 weeks per Dr. Arnoldo Morale.    Elevated liver enzymes: Mildly elevated, statin  on hold temporarily. Follow up LFTs outpatient recommended.    Hypertension: Continue metoprolol   GERD: Continue PPI   Normocytic anemia: Currently hemoglobin stable in the range of 10   Diabetes type 2: resume home treatments  CBG (last 3)  Recent Labs    06/17/22 2207 06/18/22 0657 06/18/22 1105  GLUCAP 129* 124* 183*      DVT prophylaxis:heparin injection 5,000 Units Start: 06/14/22 2200 SCDs Start: 06/14/22 2011   Discharge Diagnoses:  Principal Problem:   Acute cholecystitis Active Problems:   Diabetes mellitus, type 2 ([place])   Gastroesophageal reflux disease   Essential hypertension   Abnormal LFTs   Calculus of gallbladder with acute cholecystitis without obstruction   Discharge Instructions:  Allergies as of 06/18/2022   No Known Allergies      Medication List     STOP taking these medications    atorvastatin 40 MG tablet Commonly known as: LIPITOR       TAKE these medications    acetaminophen 500 MG tablet Commonly known as: TYLENOL Take 500 mg by mouth every 8 (eight) hours as needed for mild pain.   ALPRAZolam 0.5 MG tablet Commonly known as: XANAX Take 0.5 mg by mouth at bedtime.   amoxicillin-clavulanate 875-125 MG tablet Commonly known as: AUGMENTIN Take 1 tablet by mouth 2 (two) times daily for 5 days.   ascorbic acid 500 MG tablet Commonly known as: VITAMIN C Take 500 mg by mouth daily.   clopidogrel 75 MG tablet  Commonly known as: PLAVIX TAKE ONE TABLET (75 MG TOTAL) BY MOUTH DAILY WITH BREAKFAST. What changed: See the new instructions.   FeroSul 325 (65 FE) MG tablet Generic drug: ferrous sulfate TAKE ONE TABLET (325MG TOTAL) BY MOUTH DAILY What changed: See the new  instructions.   glimepiride 1 MG tablet Commonly known as: AMARYL Take 1 mg by mouth daily with breakfast.   meclizine 12.5 MG tablet Commonly known as: ANTIVERT Take 1 tablet (12.5 mg total) by mouth 3 (three) times daily as needed for dizziness.   metoprolol tartrate 25 MG tablet Commonly known as: LOPRESSOR Take 0.5 tablets (12.5 mg total) by mouth 2 (two) times daily.   multivitamin tablet Take 1 tablet by mouth daily.   pantoprazole 40 MG tablet Commonly known as: PROTONIX TAKE ONE TABLET (40 MG TOTAL) BY MOUTH ONCE DAILY. What changed: See the new instructions.   PRESERVISION AREDS 2 PO Take 1 tablet by mouth daily. Takes 1 tablet in the morning and 1 tablet at night for eyes.   VISINE DRY EYE OP Place 1 drop into both eyes daily as needed (for dry eye relief).   VITAMIN D3 PO Take by mouth.        Follow-up Information     Aviva Signs, MD. Call on 07/05/2022.   Specialty: General Surgery Contact information: [address] [place] [address] [phone]         [name], MD Follow up in 1 week(s).   Specialty: Family Medicine Why: Hospital Follow Up Contact information: [address] [place] [address] [phone]                No Known Allergies Allergies as of 06/18/2022   No Known Allergies      Medication List     STOP taking these medications    atorvastatin 40 MG tablet Commonly known as: LIPITOR       TAKE these medications    acetaminophen 500 MG tablet Commonly known as: TYLENOL Take 500 mg by mouth every 8 (eight) hours as needed for mild pain.   ALPRAZolam 0.5 MG tablet Commonly known as: XANAX Take 0.5 mg by mouth at bedtime.   amoxicillin-clavulanate 875-125 MG tablet Commonly known as: AUGMENTIN Take 1 tablet by mouth 2 (two) times daily for 5 days.   ascorbic acid 500 MG tablet Commonly known as: VITAMIN C Take 500 mg by mouth daily.   clopidogrel 75 MG tablet Commonly  known as: PLAVIX TAKE ONE TABLET (75 MG TOTAL) BY MOUTH DAILY WITH BREAKFAST. What changed: See the new instructions.   FeroSul 325 (65 FE) MG tablet Generic drug: ferrous sulfate TAKE ONE TABLET (325MG TOTAL) BY MOUTH DAILY What changed: See the new instructions.   glimepiride 1 MG tablet Commonly known as: AMARYL Take 1 mg by mouth daily with breakfast.   meclizine 12.5 MG tablet Commonly known as: ANTIVERT Take 1 tablet (12.5 mg total) by mouth 3 (three) times daily as needed for dizziness.   metoprolol tartrate 25 MG tablet Commonly known as: LOPRESSOR Take 0.5 tablets (12.5 mg total) by mouth 2 (two) times daily.   multivitamin tablet Take 1 tablet by mouth daily.   pantoprazole 40 MG tablet Commonly known as: PROTONIX TAKE ONE TABLET (40 MG TOTAL) BY MOUTH ONCE DAILY. What changed: See the new instructions.   PRESERVISION AREDS 2 PO Take 1 tablet by mouth daily. Takes 1 tablet in the morning and 1 tablet at night for eyes.   VISINE  DRY EYE OP Place 1 drop into both eyes daily as needed (for dry eye relief).   VITAMIN D3 PO Take by mouth.        Procedures/Studies: DG Abd 1 View  Result Date: 06/16/2022 CLINICAL DATA:  Abdominal distension, pain from the drain site. EXAM: ABDOMEN - 1 VIEW COMPARISON:  June 14, 2022 FINDINGS: An artificial aortic valve is seen within the visualized portion of the lungs. The bowel gas pattern is normal. A moderate amount of stool is seen throughout the large bowel. A radiopaque biliary drainage catheter is seen overlying the right upper quadrant. This is stable in position when compared to the prior study. No radio-opaque calculi or other significant radiographic abnormality are seen. Intact total right hip replacement is noted. IMPRESSION: 1. Moderate stool burden without evidence of bowel obstruction. 2. Stable biliary drainage catheter positioning. Electronically Signed   By: Virgina Norfolk M.D.   On: 06/16/2022 17:53   US  Venous Img Lower Bilateral (DVT)  Result Date: 06/15/2022 CLINICAL DATA:  Bilateral edema EXAM: BILATERAL LOWER EXTREMITY VENOUS DOPPLER ULTRASOUND TECHNIQUE: Gray-scale sonography with compression, as well as color and duplex ultrasound, were performed to evaluate the deep venous system(s) from the level of the common femoral vein through the popliteal and proximal calf veins. COMPARISON:  None Available. FINDINGS: VENOUS Normal compressibility of the common femoral, superficial femoral, and popliteal veins, as well as the visualized calf veins. Visualized portions of profunda femoral vein and great saphenous vein unremarkable. No filling defects to suggest DVT on grayscale or color Doppler imaging. Doppler waveforms show normal direction of venous flow, normal respiratory plasticity and response to augmentation. OTHER None. Limitations: none IMPRESSION: Negative. Electronically Signed   By: Lucrezia Europe M.D.   On: 06/15/2022 15:02   CT ABDOMEN PELVIS W CONTRAST  Result Date: 06/14/2022 CLINICAL DATA:  Biliary obstruction suspected.  Pain. EXAM: CT ABDOMEN AND PELVIS WITH CONTRAST TECHNIQUE: Multidetector CT imaging of the abdomen and pelvis was performed using the standard protocol following bolus administration of intravenous contrast. RADIATION DOSE REDUCTION: This exam was performed according to the departmental dose-optimization program which includes automated exposure control, adjustment of the mA and/or kV according to patient size and/or use of iterative reconstruction technique. CONTRAST:  161m OMNIPAQUE IOHEXOL 300 MG/ML  SOLN COMPARISON:  CTA abdomen and pelvis 11/28/2020 FINDINGS: Lower chest: There are trace bilateral pleural effusions. There is atelectasis in the lung bases. Hepatobiliary: There is gallbladder wall thickening with surrounding inflammatory stranding. A gallstone is again seen measuring 4 cm. Percutaneous cholecystostomy tube is in place coiled within the gallbladder. There is  some questionable wall discontinuity along the inferior margin of the gallbladder seen on coronal image 5/47. No discrete separate fluid collection identified. No biliary ductal dilatation. Liver is within normal limits. Pancreas: Unremarkable. No pancreatic ductal dilatation or surrounding inflammatory changes. Spleen: Normal in size without focal abnormality. Adrenals/Urinary Tract: There is no hydronephrosis or perinephric stranding. There is a punctate 2 mm calculus in the superior pole the left kidney. Left renal cysts are present measuring up to 1.6 cm. There are subcentimeter cortical hypodensities in the right kidney which are also likely cysts. Rounded mildly hyperdense area in the right kidney measuring 12 mm appears unchanged from the prior study. The adrenal glands and bladder are within normal limits. Stomach/Bowel: Stomach is within normal limits. Appendix appears normal. No evidence of bowel wall thickening, distention, or inflammatory changes. There is sigmoid colon diverticulosis. Vascular/Lymphatic: There are atherosclerotic calcifications of the aorta.  Aorta is normal in size. There are nonenlarged periportal lymph nodes. No enlarged lymph nodes are identified. Reproductive: Right adnexal cystic area measuring 2.6 by 2.7 cm is likely related to the right ovary and appears new from prior examination. Left ovary and uterus are unremarkable. Other: There is no ascites or free intraperitoneal air. No focal abdominal wall hernia. There is a small amount of subcutaneous air in the lower right anterior abdominal wall which may be related to medication injection site. Musculoskeletal: Right hip arthroplasty is present. Multilevel degenerative changes affect the spine. IMPRESSION: 1. Cholecystostomy tube is in place. Gallbladder wall thickening with surrounding inflammatory stranding compatible with cholecystitis. There is some questionable wall discontinuity along the inferior margin of the gallbladder  worrisome for gallbladder perforation. No discrete fluid collection identified. 2. Trace bilateral pleural effusions. 3. Nonobstructing left renal calculus. 4. Bilateral renal cysts. There is a hyperdense lesion in the right kidney which is indeterminate. 5. 2.7 cm right ovarian simple-appearing cyst. Recommend prompt follow-up with pelvic US. Electronically Signed   By: Ronney Asters M.D.   On: 06/14/2022 18:43   DG Abdomen Acute W/Chest  Result Date: 06/14/2022 CLINICAL DATA:  Shortness of breath with abdomen pain EXAM: DG ABDOMEN ACUTE WITH 1 VIEW CHEST COMPARISON:  10/26/2021 FINDINGS: Single-view chest demonstrates left-sided pacing device. No acute airspace disease. Normal cardiac size. Supine and upright views of the abdomen demonstrate no free air beneath the diaphragm. Nonobstructed gas pattern with moderate right colonic stool. Right upper quadrant drainage catheter. Right hip replacement. IMPRESSION: 1. No radiographic evidence for acute cardiopulmonary abnormality 2. Nonobstructed gas pattern Electronically Signed   By: Donavan Foil M.D.   On: 06/14/2022 17:37     Subjective: Pt reports feeling much better after large bowel movement yesterday  Discharge Exam: Vitals:   06/17/22 2030 06/18/22 0621  BP: (!) 129/59 119/65  Pulse: 75 84  Resp: 18 18  Temp: 98.2 F (36.8 C) 97.6 F (36.4 C)  SpO2: 96% 98%   Vitals:   06/17/22 0542 06/17/22 1430 06/17/22 2030 06/18/22 0621  BP: (!) 112/59 104/62 (!) 129/59 119/65  Pulse: 79 88 75 84  Resp: 18 20 18 18   Temp: 98.4 F (36.9 C) 97.7 F (36.5 C) 98.2 F (36.8 C) 97.6 F (36.4 C)  TempSrc: Oral Oral Oral Oral  SpO2: 94% 96% 96% 98%  Weight:      Height:       General exam: frail, elderly female, awake, alert, cooperative.  HEENT: NCAT, neck supple.  Respiratory system:  no increased work of breathing.   Cardiovascular system: normal S1 & S2 heard.    Gastrointestinal system: Abdomen is soft and nontender.  Right upper  quadrant drain with bilious output seen.  Central nervous system: Alert and oriented Extremities: trace lower extremity edema, no clubbing ,no cyanosis Skin: No rashes, no ulcers,no icterus     The results of significant diagnostics from this hospitalization (including imaging, microbiology, ancillary and laboratory) are listed below for reference.     Microbiology: No results found for this or any previous visit (from the past 240 hour(s)).   Labs: BNP (last 3 results) Recent Labs    07/12/21 0450 10/26/21 1406  BNP 200.2* 349.1*   Basic Metabolic Panel: Recent Labs  Lab 06/14/22 1705 06/15/22 0353 06/16/22 0354 06/17/22 0449  NA 137 135 136 137  K 5.1 4.1 4.1 4.4  CL 100 100 103 103  CO2 27 26 26 27   GLUCOSE 110* 103* 105* 118*  BUN 19 18 18 16   CREATININE 0.83 0.84 1.03* 1.10*  CALCIUM 9.0 8.6* 8.4* 8.4*  MG  --  1.9  --   --    Liver Function Tests: Recent Labs  Lab 06/14/22 1705 06/15/22 0353 06/16/22 0354 06/17/22 0449  AST 74* 66* 78* 53*  ALT 114* 103* 111* 99*  ALKPHOS 146* 122 139* 131*  BILITOT 1.5* 1.4* 1.3* 1.4*  PROT 6.8 6.0* 6.0* 6.3*  ALBUMIN 2.7* 2.4* 2.4* 2.5*   Recent Labs  Lab 06/14/22 1705  LIPASE 66*   No results for input(s): "AMMONIA" in the last 168 hours. CBC: Recent Labs  Lab 06/14/22 1705 06/15/22 0353 06/16/22 0350 06/17/22 0449  WBC 10.4 9.8 9.0 10.3  NEUTROABS 6.9 6.8  --   --   HGB 11.8* 10.5* 10.4* 10.6*  HCT 35.8* 32.0* 31.0* 32.5*  MCV 91.8 92.8 90.9 92.3  PLT 295 287 349 398   Cardiac Enzymes: No results for input(s): "CKTOTAL", "CKMB", "CKMBINDEX", "TROPONINI" in the last 168 hours. BNP: Invalid input(s): "POCBNP" CBG: Recent Labs  Lab 06/17/22 1152 06/17/22 1701 06/17/22 2207 06/18/22 0657 06/18/22 1105  GLUCAP 144* 128* 129* 124* 183*   D-Dimer No results for input(s): "DDIMER" in the last 72 hours. Hgb A1c Recent Labs    06/16/22 0354  HGBA1C 6.7*   Lipid Profile No results for  input(s): "CHOL", "HDL", "LDLCALC", "TRIG", "CHOLHDL", "LDLDIRECT" in the last 72 hours. Thyroid function studies No results for input(s): "TSH", "T4TOTAL", "T3FREE", "THYROIDAB" in the last 72 hours.  Invalid input(s): "FREET3" Anemia work up No results for input(s): "VITAMINB12", "FOLATE", "FERRITIN", "TIBC", "IRON", "RETICCTPCT" in the last 72 hours. Urinalysis    Component Value Date/Time   COLORURINE YELLOW 05/22/2021 1307   APPEARANCEUR HAZY (A) 05/22/2021 1307   LABSPEC 1.010 05/22/2021 1307   PHURINE 7.0 05/22/2021 1307   GLUCOSEU NEGATIVE 05/22/2021 1307   HGBUR NEGATIVE 05/22/2021 1307   BILIRUBINUR NEGATIVE 05/22/2021 1307   KETONESUR 15 (A) 05/22/2021 1307   PROTEINUR NEGATIVE 05/22/2021 1307   UROBILINOGEN 1.0 05/23/2015 1956   NITRITE NEGATIVE 05/22/2021 1307   LEUKOCYTESUR NEGATIVE 05/22/2021 1307   Sepsis Labs Recent Labs  Lab 06/14/22 1705 06/15/22 0353 06/16/22 0350 06/17/22 0449  WBC 10.4 9.8 9.0 10.3   Microbiology No results found for this or any previous visit (from the past 240 hour(s)).  Time coordinating discharge: 36 mins  SIGNED:  Irwin Brakeman, MD  [facility] 06/18/2022, 11:23 AM How to contact the [facility] Attending or Consulting provider [place] or covering provider during after hours [place], for this patient?  Check the care team in [facility] and look for a) attending/consulting [facility] provider listed and b) the [facility] team listed Log into [email or web address] and use [place]'s universal password to access. If you do not have the password, please contact the hospital operator. Locate the [facility] provider you are looking for under [facility] and page to a number that you can be directly reached. If you still have difficulty reaching the provider, please page the [facility] (Director on Call) for the Hospitalists listed on amion for assistance.

## 2022-06-27 ENCOUNTER — Other Ambulatory Visit: Payer: Self-pay | Admitting: Nurse Practitioner

## 2022-06-27 ENCOUNTER — Other Ambulatory Visit (HOSPITAL_COMMUNITY): Payer: Self-pay | Admitting: Nurse Practitioner

## 2022-06-27 DIAGNOSIS — N83201 Unspecified ovarian cyst, right side: Secondary | ICD-10-CM

## 2022-07-05 ENCOUNTER — Ambulatory Visit: Payer: Medicare Other | Admitting: General Surgery

## 2022-07-12 ENCOUNTER — Ambulatory Visit (INDEPENDENT_AMBULATORY_CARE_PROVIDER_SITE_OTHER): Payer: Medicare Other

## 2022-07-12 ENCOUNTER — Other Ambulatory Visit (HOSPITAL_COMMUNITY): Payer: Self-pay | Admitting: Surgery

## 2022-07-12 DIAGNOSIS — Z434 Encounter for attention to other artificial openings of digestive tract: Secondary | ICD-10-CM

## 2022-07-12 DIAGNOSIS — I442 Atrioventricular block, complete: Secondary | ICD-10-CM

## 2022-07-13 ENCOUNTER — Ambulatory Visit (HOSPITAL_COMMUNITY)
Admission: RE | Admit: 2022-07-13 | Discharge: 2022-07-13 | Disposition: A | Discharge status: Home / Self Care | Payer: Medicare Other | Source: Ambulatory Visit | Attending: Surgery | Admitting: Surgery

## 2022-07-13 ENCOUNTER — Other Ambulatory Visit (HOSPITAL_COMMUNITY): Payer: Self-pay | Admitting: Interventional Radiology

## 2022-07-13 DIAGNOSIS — Z434 Encounter for attention to other artificial openings of digestive tract: Secondary | ICD-10-CM | POA: Diagnosis present

## 2022-07-13 DIAGNOSIS — K819 Cholecystitis, unspecified: Secondary | ICD-10-CM

## 2022-07-13 DIAGNOSIS — K8020 Calculus of gallbladder without cholecystitis without obstruction: Secondary | ICD-10-CM | POA: Diagnosis not present

## 2022-07-13 HISTORY — PX: IR CHOLANGIOGRAM EXISTING TUBE: IMG6040

## 2022-07-13 LAB — CUP PACEART REMOTE DEVICE CHECK
Date Time Interrogation Session: 20231025095832
Implantable Lead Connection Status: 753985
Implantable Lead Connection Status: 753985
Implantable Lead Implant Date: 20221026
Implantable Lead Implant Date: 20221026
Implantable Lead Location: 753859
Implantable Lead Location: 753860
Implantable Lead Model: 377171
Implantable Lead Model: 377171
Implantable Lead Serial Number: 8000595070
Implantable Lead Serial Number: 8000606642
Implantable Pulse Generator Implant Date: 20221026
Pulse Gen Model: 407145
Pulse Gen Serial Number: 70223175

## 2022-07-13 MED ORDER — LIDOCAINE HCL 1 % IJ SOLN
INTRAMUSCULAR | Status: AC
  Filled 2022-07-13: qty 20

## 2022-07-13 MED ORDER — IOHEXOL 300 MG/ML  SOLN
50.0000 mL | Freq: Once | INTRAMUSCULAR | Status: AC | PRN
  Administered 2022-07-13: 40 mL

## 2022-07-25 NOTE — Progress Notes (Signed)
Remote pacemaker transmission.   

## 2022-08-09 ENCOUNTER — Other Ambulatory Visit (HOSPITAL_COMMUNITY): Payer: Self-pay | Admitting: Interventional Radiology

## 2022-08-09 ENCOUNTER — Ambulatory Visit (HOSPITAL_COMMUNITY)
Admission: RE | Admit: 2022-08-09 | Discharge: 2022-08-09 | Disposition: A | Discharge status: Home / Self Care | Payer: Medicare Other | Source: Ambulatory Visit | Attending: Interventional Radiology | Admitting: Interventional Radiology

## 2022-08-09 DIAGNOSIS — Z434 Encounter for attention to other artificial openings of digestive tract: Secondary | ICD-10-CM | POA: Insufficient documentation

## 2022-08-09 DIAGNOSIS — K819 Cholecystitis, unspecified: Secondary | ICD-10-CM | POA: Insufficient documentation

## 2022-08-09 HISTORY — PX: IR REMOVAL BILIARY DRAIN: IMG6047

## 2022-08-09 MED ORDER — IOHEXOL 300 MG/ML  SOLN
50.0000 mL | Freq: Once | INTRAMUSCULAR | Status: AC | PRN
  Administered 2022-08-09: 7 mL

## 2022-08-09 MED ORDER — LIDOCAINE HCL 1 % IJ SOLN
INTRAMUSCULAR | Status: AC
  Filled 2022-08-09: qty 20

## 2022-08-09 NOTE — Procedures (Signed)
Interventional Radiology Procedure:   Indications: History of cholecystitis and cholecystostomy tube placement.  Completed capping trial.   Procedure: Cholangiogram and drain removal   Findings: No symptoms with capping trial.  Sheath cholangiogram demonstrates large gallstone with patent cystic duct and CBD.  Drain tract is well-formed.    Complications: No immediate complications noted.     EBL: Minimal  Plan: Discharge to home.  Change bandage as needed.    Rabecka Brendel R. Anselm Pancoast, MD  Pager: [phone]

## 2022-08-27 NOTE — Progress Notes (Unsigned)
Electrophysiology Office Follow up Visit Note:    Date:  08/27/2022   ID:  Myha, Arizpe 1923/08/05, MRN [id number]  PCP:  [name], MD  [facility] Cardiologist:  Jenkins Rouge, MD  [facility] Electrophysiologist:  Vickie Epley, MD    Interval History:    Denise Parrish is a 88 y.o. female who presents for a follow up visit. She had a PPM implanted 07/12/2021 for CHB. Her PPM is functioning well.       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 ROk Edwardsdiverticulosis, negative microscopic  colitis    COLONOSCOPY WITH PROPOFOL N/A 11/27/2020   Surgeon: CHarvel Quale MD;  diverticulosis, no active bleeding.  Two 2-4 mm polyps not removed in the setting of Plavix.   CORONARY STENT INTERVENTION N/A 09/08/2020   Procedure: CORONARY STENT INTERVENTION;  Surgeon: CSherren Mocha MD;  [place]  Service: Cardiovascular;  Laterality: N/A;   ENDARTERECTOMY Right 10/21/2019   Procedure: ENDARTERECTOMY CAROTID RIGHT;  Surgeon: CWaynetta Sandy MD;  [place]  Service: Vascular;  Laterality: Right;   ESOPHAGOGASTRODUODENOSCOPY  04/03/2012   Dr. RJennet Madurohernia, chronic gastritis on bx   ESOPHAGOGASTRODUODENOSCOPY (EGD) WITH PROPOFOL N/A 04/28/2020   Procedure: ESOPHAGOGASTRODUODENOSCOPY (EGD) WITH PROPOFOL;  Surgeon: BOtis Brace MD;  [place]  Service: Gastroenterology;  Laterality: N/A;   ESOPHAGOGASTRODUODENOSCOPY (EGD) WITH PROPOFOL N/A 11/27/2020   Surgeon: CMontez Morita DQuillian Quince MD; findings suggestive of short segment Barrett's, normal stomach, and small bowel.   FEMORAL ARTERY EXPLORATION Right 09/28/2020   Procedure: RIGHT GROIN EXPLORATION WITH REPAIR RIGHT COMMON FEMORAL ARTERY;  Surgeon: ERosetta Posner MD;  [facility];  Service: Vascular;  Laterality: Right;   GIVENS CAPSULE STUDY  05/09/2012   RMR: an unclear raised area of small bowel was noted, with features almost  characteristic of very small polyp. This was without villous  blunting or any evidence of active bleeding; yet, the area of  concern appeared to be erythematous. However, this could simply  be a light reflection on a normal variation of the small bowel. REFERRED TO DR. GILLIAM, APPT FOR NOVEMBER 2013.    GIVENS CAPSULE STUDY  N/A 04/30/2020   Procedure: GIVENS CAPSULE STUDY;  Surgeon: Otis Brace, MD;  Location: [place];  Service: Gastroenterology;  Laterality: N/A;   [place] STUDY  11/27/2020    Surgeon: Montez Morita, Quillian Quince, MD;  questionable nodule/small mass in the distal small bowel but without ulceration.  Evidence of hematochezia throughout the colon but no fresh blood.    HIP ARTHROPLASTY Right    INSERT / REPLACE / REMOVE PACEMAKER     IR CHOLANGIOGRAM EXISTING TUBE  07/13/2022   IR REMOVAL BILIARY DRAIN  08/09/2022    PACEMAKER IMPLANT N/A 07/12/2021   Procedure: PACEMAKER IMPLANT;  Surgeon: Vickie Epley, MD;  Location: [place] [facility];  Service: Cardiovascular;  Laterality: N/A;   PATCH ANGIOPLASTY Right 10/21/2019   Procedure: PATCH ANGIOPLASTY USING Rueben Bash BIOLOGIC PATCH;  Surgeon: Waynetta Sandy, MD;  Location: [place];  Service: Vascular;  Laterality: Right;   POLYPECTOMY  04/28/2020   Procedure: POLYPECTOMY;  Surgeon: Otis Brace, MD;  Location: [place] [facility];  Service: Gastroenterology;;   RIGHT/LEFT HEART CATH AND CORONARY ANGIOGRAPHY N/A 09/08/2020   Procedure: RIGHT/LEFT HEART CATH AND CORONARY ANGIOGRAPHY;  Surgeon: Sherren Mocha, MD;  Location: [place] [facility];  Service: Cardiovascular;  Laterality: N/A;   TEE WITHOUT CARDIOVERSION N/A 09/28/2020   Procedure: TRANSESOPHAGEAL ECHOCARDIOGRAM (TEE);  Surgeon: Sherren Mocha, MD;  Location: [place];  Service: Open Heart Surgery;  Laterality: N/A;   TONSILLECTOMY     TOTAL HIP ARTHROPLASTY  1995   Right   TRANSCATHETER AORTIC VALVE REPLACEMENT, TRANSFEMORAL N/A 09/28/2020   Procedure: TRANSCATHETER AORTIC VALVE REPLACEMENT, TRANSFEMORAL;  Surgeon: Sherren Mocha, MD;  Location: [place];  Service: Open Heart Surgery;  Laterality: N/A;    Current Medications: No outpatient medications have been marked as taking for the 08/28/22 encounter (Appointment) with Vickie Epley, MD.     Allergies:   Patient has no known allergies.   Social History   Socioeconomic History   Marital status: Divorced    Spouse name: Not on file   Number of children: 4   Years of education: 6th grade   Highest education level: Not on file  Occupational History   Occupation: Retired    Comment: SNF  Tobacco Use   Smoking status: Never   Smokeless tobacco: Never   Tobacco comments:    Quit x 50 years  Vaping Use   Vaping Use: Never used  Substance and Sexual Activity   Alcohol use: Never   Drug use: Never   Sexual activity:  Not Currently  Other Topics Concern   Not on file  Social History Narrative   Lives alone. Every other week she has family with her. Daughter and son take care of her during the days.    Right-handed.   No daily use of caffeine.             Social Determinants of Health   Financial Resource Strain: Not on file  Food Insecurity: No Food Insecurity (06/17/2022)   Hunger Vital Sign    Worried About Running Out of Food in the Last Year: Never true    Ran Out of Food in the Last Year: Never true  Transportation Needs: No Transportation Needs (06/17/2022)   PRAPARE - Hydrologist (Medical): No    Lack of Transportation (Non-Medical): No  Physical Activity: Not on file  Stress: Not on file  Social Connections: Not on file     Family History: The patient's family history  includes CAD in her father; Cancer in her brother; Cirrhosis in her mother; Diabetes in her mother; Heart failure in her father; Hypertension in her father; Uterine cancer in her sister. There is no history of Colon cancer.  ROS:   Please see the history of present illness.    All other systems reviewed and are negative.  EKGs/Labs/Other Studies Reviewed:    The following studies were reviewed today:  08/28/2022 in clinic device interrogation personally reviewed ***   Recent Labs: 10/26/2021: B Natriuretic Peptide 116.0 06/15/2022: Magnesium 1.9 06/17/2022: ALT 99; BUN 16; Creatinine, Ser 1.10; Hemoglobin 10.6; Platelets 398; Potassium 4.4; Sodium 137  Recent Lipid Panel    Component Value Date/Time   CHOL 100 04/08/2020 0441   TRIG 83 04/08/2020 0441   HDL 35 (L) 04/08/2020 0441   CHOLHDL 2.9 04/08/2020 0441   VLDL 17 04/08/2020 0441   LDLCALC 48 04/08/2020 0441    Physical Exam:    VS:  There were no vitals taken for this visit.    Wt Readings from Last 3 Encounters:  06/14/22 170 lb (77.1 kg)  01/02/22 167 lb 12.8 oz (76.1 kg)  12/07/21 171 lb 9.6 oz (77.8 kg)      GEN: *** Well nourished, well developed in no acute distress HEENT: Normal NECK: No JVD; No carotid bruits LYMPHATICS: No lymphadenopathy CARDIAC: ***RRR, no murmurs, rubs, gallops. Prepectoral pocket well healed. RESPIRATORY:  Clear to auscultation without rales, wheezing or rhonchi  ABDOMEN: Soft, non-tender, non-distended MUSCULOSKELETAL:  No edema; No deformity  SKIN: Warm and dry NEUROLOGIC:  Alert and oriented x 3 PSYCHIATRIC:  Normal affect        ASSESSMENT:    1. AV block, complete (HCC)   2. Pacemaker   3. Severe aortic stenosis   4. S/P TAVR (transcatheter aortic valve replacement)    PLAN:    In order of problems listed above:   #Complete heart block #PPM in situ Device functioning appropriately. Continue remote monitoring.  #Severe AS s/p TAVR Doing well without signs/symptoms of AV dysfunction.   Follow up 1 year with APP.     Medication Adjustments/Labs and Tests Ordered: Current medicines are reviewed at length with the patient today.  Concerns regarding medicines are outlined above.  No orders of the defined types were placed in this encounter.  No orders of the defined types were placed in this encounter.    Signed, Lars Mage, MD, [facility], [facility] 08/27/2022 8:27 PM    Electrophysiology [facility]

## 2022-08-28 ENCOUNTER — Encounter: Payer: Self-pay | Admitting: Cardiology

## 2022-08-28 ENCOUNTER — Ambulatory Visit: Payer: Medicare Other | Attending: Cardiology | Admitting: Cardiology

## 2022-08-28 VITALS — BP 104/62 | HR 64 | Ht 66.0 in | Wt 162.0 lb

## 2022-08-28 DIAGNOSIS — I350 Nonrheumatic aortic (valve) stenosis: Secondary | ICD-10-CM

## 2022-08-28 DIAGNOSIS — Z952 Presence of prosthetic heart valve: Secondary | ICD-10-CM

## 2022-08-28 DIAGNOSIS — I442 Atrioventricular block, complete: Secondary | ICD-10-CM

## 2022-08-28 DIAGNOSIS — Z950 Presence of cardiac pacemaker: Secondary | ICD-10-CM

## 2022-08-28 NOTE — Patient Instructions (Signed)
Medication Instructions:  Your physician recommends that you continue on your current medications as directed. Please refer to the Current Medication list given to you today.  *If you need a refill on your cardiac medications before your next appointment, please call your pharmacy*  Follow-Up: At [facility], you and your health needs are our priority.  As part of our continuing mission to provide you with exceptional heart care, we have created designated Provider Care Teams.  These Care Teams include your primary Cardiologist (physician) and Advanced Practice Providers (APPs -  Physician Assistants and Nurse Practitioners) who all work together to provide you with the care you need, when you need it.  Your next appointment:   1 Year  The format for your next appointment:   In Person  Provider:   You may see Vickie Epley, MD or one of the following Advanced Practice Providers on your designated Care Team:   Tommye Standard, [place] Legrand Como "Jonni Sanger" Chalmers Cater, [place]    Important Information About Sugar

## 2022-08-28 NOTE — Progress Notes (Signed)
Electrophysiology Office Follow up Visit Note:    Date:  08/28/2022   ID:  Denise Parrish, Denise Parrish [birth year], MRN [id number]  PCP:  [name], MD  [facility] Cardiologist:  Jenkins Rouge, MD  [facility] Electrophysiologist:  Vickie Epley, MD    Interval History:    Denise Parrish is a 88 y.o. female who presents for a follow up visit. She had a PPM implanted 07/12/2021 for CHB. Her PPM is functioning well.  Today, she is accompanied by a family member. She believes she has felt her heart skipping at times. Otherwise she feels well from a cardiovascular perspective.  In November she had presented to the hospital with pain related to her history of cholecystitis.  She denies any chest pain, shortness of breath, or peripheral edema. No lightheadedness, headaches, syncope, orthopnea, or PND.       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 ROk Edwardsdiverticulosis, negative microscopic  colitis    COLONOSCOPY WITH PROPOFOL N/A 11/27/2020   Surgeon: CHarvel Quale MD;  diverticulosis, no active bleeding.  Two 2-4 mm polyps not removed in the setting of Plavix.   CORONARY STENT INTERVENTION N/A 09/08/2020   Procedure: CORONARY STENT INTERVENTION;  Surgeon: CSherren Mocha MD;  [place];  Service: Cardiovascular;  Laterality: N/A;   ENDARTERECTOMY Right 10/21/2019   Procedure: ENDARTERECTOMY CAROTID RIGHT;  Surgeon: CWaynetta Sandy MD;  [place]  Service: Vascular;  Laterality: Right;   ESOPHAGOGASTRODUODENOSCOPY  04/03/2012   Dr. RJennet Madurohernia, chronic gastritis on bx   ESOPHAGOGASTRODUODENOSCOPY (EGD) WITH PROPOFOL N/A 04/28/2020   Procedure: ESOPHAGOGASTRODUODENOSCOPY (EGD) WITH PROPOFOL;  Surgeon: BOtis Brace MD;  [place]  Service: Gastroenterology;  Laterality: N/A;   ESOPHAGOGASTRODUODENOSCOPY (EGD) WITH PROPOFOL N/A 11/27/2020   Surgeon: CMontez Morita DQuillian Quince MD; findings suggestive of short segment Barrett's, normal stomach, and small bowel.   FEMORAL ARTERY EXPLORATION Right 09/28/2020   Procedure: RIGHT GROIN EXPLORATION WITH REPAIR RIGHT COMMON FEMORAL ARTERY;  Surgeon: ERosetta Posner MD;  [facility];  Service: Vascular;  Laterality: Right;   GIVENS CAPSULE STUDY  05/09/2012   RMR: an unclear raised area of small bowel was  noted, with features almost  characteristic of very small polyp. This was without villous  blunting or any evidence of active bleeding; yet, the area of  concern appeared to be erythematous. However, this could simply  be a light reflection on a normal variation of the small bowel. REFERRED TO DR. GILLIAM, APPT FOR NOVEMBER 2013.    GIVENS CAPSULE STUDY N/A 04/30/2020   Procedure: GIVENS CAPSULE STUDY;  Surgeon: Otis Brace, MD;  Location: [place];  Service: Gastroenterology;   Laterality: N/A;   [place] STUDY  11/27/2020    Surgeon: Montez Morita, Quillian Quince, MD;  questionable nodule/small mass in the distal small bowel but without ulceration.  Evidence of hematochezia throughout the colon but no fresh blood.    HIP ARTHROPLASTY Right    INSERT / REPLACE / REMOVE PACEMAKER     IR CHOLANGIOGRAM EXISTING TUBE  07/13/2022   IR REMOVAL BILIARY DRAIN  08/09/2022   PACEMAKER IMPLANT N/A 07/12/2021   Procedure: PACEMAKER IMPLANT;  Surgeon: Vickie Epley, MD;  Location: [place] [facility];  Service: Cardiovascular;  Laterality: N/A;   PATCH ANGIOPLASTY Right 10/21/2019   Procedure: PATCH ANGIOPLASTY USING Rueben Bash BIOLOGIC PATCH;  Surgeon: Waynetta Sandy, MD;  Location: [place];  Service: Vascular;  Laterality: Right;   POLYPECTOMY  04/28/2020   Procedure: POLYPECTOMY;  Surgeon: Otis Brace, MD;  Location: [place] [facility];  Service: Gastroenterology;;   RIGHT/LEFT HEART CATH AND CORONARY ANGIOGRAPHY N/A 09/08/2020   Procedure: RIGHT/LEFT HEART CATH AND CORONARY ANGIOGRAPHY;  Surgeon: Sherren Mocha, MD;  Location: [place] [facility];  Service: Cardiovascular;  Laterality: N/A;   TEE WITHOUT CARDIOVERSION N/A 09/28/2020   Procedure: TRANSESOPHAGEAL ECHOCARDIOGRAM (TEE);  Surgeon: Sherren Mocha, MD;  Location: [place];  Service: Open Heart Surgery;  Laterality: N/A;   TONSILLECTOMY     TOTAL HIP ARTHROPLASTY  1995   Right   TRANSCATHETER AORTIC VALVE REPLACEMENT, TRANSFEMORAL N/A 09/28/2020   Procedure: TRANSCATHETER AORTIC VALVE REPLACEMENT, TRANSFEMORAL;  Surgeon: Sherren Mocha, MD;  Location: [place];  Service: Open Heart Surgery;  Laterality: N/A;    Current Medications: Current Meds  Medication Sig   acetaminophen (TYLENOL) 500 MG tablet Take 500 mg by mouth every 8 (eight) hours as needed for mild pain.   ALPRAZolam (XANAX) 0.5 MG tablet Take 0.5 mg by mouth at bedtime.   Cholecalciferol (VITAMIN D3 PO) Take by mouth.   clopidogrel  (PLAVIX) 75 MG tablet TAKE ONE TABLET (75 MG TOTAL) BY MOUTH DAILY WITH BREAKFAST. (Patient taking differently: Take 75 mg by mouth daily.)   FEROSUL 325 (65 Fe) MG tablet TAKE ONE TABLET (325MG TOTAL) BY MOUTH DAILY (Patient taking differently: Take 325 mg by mouth daily.)   glimepiride (AMARYL) 1 MG tablet Take 1 mg by mouth daily with breakfast.   Glycerin-Hypromellose-PEG 400 (VISINE DRY EYE OP) Place 1 drop into both eyes daily as needed (for dry eye relief).   meclizine (ANTIVERT) 12.5 MG tablet Take 1 tablet (12.5 mg total) by mouth 3 (three) times daily as needed for dizziness.   metoprolol tartrate (LOPRESSOR) 25 MG tablet Take 0.5 tablets (12.5 mg total) by mouth 2 (two) times daily.   Multiple Vitamin (MULTIVITAMIN) tablet Take 1 tablet by mouth daily.   Multiple Vitamins-Minerals (PRESERVISION AREDS 2 PO) Take 1 tablet by mouth daily. Takes 1 tablet in the morning and 1 tablet at night for eyes.   pantoprazole (PROTONIX) 40 MG tablet TAKE ONE TABLET (40 MG TOTAL) BY MOUTH ONCE DAILY. (Patient taking differently: Take  40 mg by mouth daily.)   vitamin C (ASCORBIC ACID) 500 MG tablet Take 500 mg by mouth daily.     Allergies:   Patient has no known allergies.   Social History   Socioeconomic History   Marital status: Divorced    Spouse name: Not on file   Number of children: 4   Years of education: 6th grade   Highest education level: Not on file  Occupational History   Occupation: Retired    Comment: SNF  Tobacco Use   Smoking status: Never   Smokeless tobacco: Never   Tobacco comments:    Quit x 50 years  Vaping Use   Vaping Use: Never used  Substance and Sexual Activity   Alcohol use: Never   Drug use: Never   Sexual activity: Not Currently  Other Topics Concern   Not on file  Social History Narrative   Lives alone. Every other week she has family with her. Daughter and son take care of her during the days.    Right-handed.   No daily use of caffeine.              Social Determinants of Health   Financial Resource Strain: Not on file  Food Insecurity: No Food Insecurity (06/17/2022)   Hunger Vital Sign    Worried About Running Out of Food in the Last Year: Never true    Ran Out of Food in the Last Year: Never true  Transportation Needs: No Transportation Needs (06/17/2022)   PRAPARE - Hydrologist (Medical): No    Lack of Transportation (Non-Medical): No  Physical Activity: Not on file  Stress: Not on file  Social Connections: Not on file     Family History: The patient's family history includes CAD in her father; Cancer in her brother; Cirrhosis in her mother; Diabetes in her mother; Heart failure in her father; Hypertension in her father; Uterine cancer in her sister. There is no history of Colon cancer.  ROS:   Please see the history of present illness.    (+) Palpitations All other systems reviewed and are negative.  EKGs/Labs/Other Studies Reviewed:    The following studies were reviewed today:  08/28/2022 in clinic device interrogation personally reviewed Battery longevity 7 yr 10 mo Lead parameters stable 35% atrial pacing 100% ventricular pacing 0% AF burden but does have AHR episodes lasting as long as 2 hr 43 min   Recent Labs: 10/26/2021: B Natriuretic Peptide 116.0 06/15/2022: Magnesium 1.9 06/17/2022: ALT 99; BUN 16; Creatinine, Ser 1.10; Hemoglobin 10.6; Platelets 398; Potassium 4.4; Sodium 137   Recent Lipid Panel    Component Value Date/Time   CHOL 100 04/08/2020 0441   TRIG 83 04/08/2020 0441   HDL 35 (L) 04/08/2020 0441   CHOLHDL 2.9 04/08/2020 0441   VLDL 17 04/08/2020 0441   LDLCALC 48 04/08/2020 0441    Physical Exam:    VS:  BP 104/62   Pulse 64   Ht 5' 6"  (1.676 m)   Wt 162 lb (73.5 kg)   SpO2 97%   BMI 26.15 kg/m     Wt Readings from Last 3 Encounters:  08/28/22 162 lb (73.5 kg)  06/14/22 170 lb (77.1 kg)  01/02/22 167 lb 12.8 oz (76.1 kg)     GEN: Well  nourished, well developed in no acute distress HEENT: Normal NECK: No JVD; No carotid bruits LYMPHATICS: No lymphadenopathy CARDIAC: RRR, no murmurs, rubs, gallops. Prepectoral pocket well healed. RESPIRATORY:  Clear to auscultation without rales, wheezing or rhonchi  ABDOMEN: Soft, non-tender, non-distended MUSCULOSKELETAL:  No edema; No deformity  SKIN: Warm and dry NEUROLOGIC:  Alert and oriented x 3 PSYCHIATRIC:  Normal affect        ASSESSMENT:    1. AV block, complete (HCC)   2. Pacemaker   3. Severe aortic stenosis   4. S/P TAVR (transcatheter aortic valve replacement)    PLAN:    In order of problems listed above:   #Complete heart block #PPM in situ Device functioning appropriately. Continue remote monitoring.  #AHR episodes Low burden. I discussed pathophysiology of AHR and AF during today's visit with the patient and her family. Given her age, comorbidities and frailty, the patient and her family have elected to NOT start anticoagulation.  #Severe AS s/p TAVR Doing well without signs/symptoms of AV dysfunction.   Follow up 1 year with APP.  Medication Adjustments/Labs and Tests Ordered: Current medicines are reviewed at length with the patient today.  Concerns regarding medicines are outlined above.   No orders of the defined types were placed in this encounter.  No orders of the defined types were placed in this encounter.  I,Mathew Stumpf,acting as a Education administrator for Vickie Epley, MD.,have documented all relevant documentation on the behalf of Vickie Epley, MD,as directed by  Vickie Epley, MD while in the presence of Vickie Epley, MD.  I, Vickie Epley, MD, have reviewed all documentation for this visit. The documentation on 08/28/22 for the exam, diagnosis, procedures, and orders are all accurate and complete.   Signed, Lars Mage, MD, [facility], [facility] 08/28/2022 5:02 PM    Electrophysiology [facility]

## 2022-09-04 ENCOUNTER — Other Ambulatory Visit: Payer: Self-pay | Admitting: Cardiology

## 2022-09-12 ENCOUNTER — Other Ambulatory Visit: Payer: Self-pay | Admitting: Gastroenterology

## 2022-09-12 DIAGNOSIS — D509 Iron deficiency anemia, unspecified: Secondary | ICD-10-CM

## 2022-10-11 ENCOUNTER — Ambulatory Visit: Payer: 59 | Attending: Cardiology

## 2022-10-11 DIAGNOSIS — I442 Atrioventricular block, complete: Secondary | ICD-10-CM

## 2022-10-11 LAB — CUP PACEART REMOTE DEVICE CHECK
Battery Remaining Percentage: 90 %
Brady Statistic RA Percent Paced: 33 %
Brady Statistic RV Percent Paced: 100 %
Date Time Interrogation Session: 20240124080117
Implantable Lead Connection Status: 753985
Implantable Lead Connection Status: 753985
Implantable Lead Implant Date: 20221026
Implantable Lead Implant Date: 20221026
Implantable Lead Location: 753859
Implantable Lead Location: 753860
Implantable Lead Model: 377171
Implantable Lead Model: 377171
Implantable Lead Serial Number: 8000595070
Implantable Lead Serial Number: 8000606642
Implantable Pulse Generator Implant Date: 20221026
Lead Channel Impedance Value: 468 Ohm
Lead Channel Impedance Value: 585 Ohm
Lead Channel Pacing Threshold Amplitude: 0.8 V
Lead Channel Pacing Threshold Pulse Width: 0.4 ms
Lead Channel Sensing Intrinsic Amplitude: 1.6 mV
Lead Channel Sensing Intrinsic Amplitude: 7.2 mV
Lead Channel Setting Pacing Amplitude: 2 V
Lead Channel Setting Pacing Amplitude: 2.6 V
Lead Channel Setting Pacing Pulse Width: 0.4 ms
Pulse Gen Model: 407145
Pulse Gen Serial Number: 70223175

## 2022-10-30 ENCOUNTER — Other Ambulatory Visit: Payer: Self-pay

## 2022-10-30 DIAGNOSIS — I6521 Occlusion and stenosis of right carotid artery: Secondary | ICD-10-CM

## 2022-10-30 DIAGNOSIS — Z9889 Other specified postprocedural states: Secondary | ICD-10-CM

## 2022-11-02 NOTE — Progress Notes (Signed)
Remote pacemaker transmission.   

## 2022-11-13 ENCOUNTER — Ambulatory Visit (HOSPITAL_COMMUNITY)
Admission: RE | Admit: 2022-11-13 | Discharge: 2022-11-13 | Disposition: A | Discharge status: Home / Self Care | Payer: 59 | Source: Ambulatory Visit | Attending: Surgery | Admitting: Surgery

## 2022-11-13 ENCOUNTER — Other Ambulatory Visit: Payer: Self-pay

## 2022-11-13 ENCOUNTER — Ambulatory Visit (INDEPENDENT_AMBULATORY_CARE_PROVIDER_SITE_OTHER): Payer: 59 | Admitting: Physician Assistant

## 2022-11-13 VITALS — BP 125/57 | HR 60 | Temp 98.0°F | Resp 20 | Ht 66.0 in | Wt 167.0 lb

## 2022-11-13 DIAGNOSIS — I6521 Occlusion and stenosis of right carotid artery: Secondary | ICD-10-CM

## 2022-11-13 DIAGNOSIS — Z9889 Other specified postprocedural states: Secondary | ICD-10-CM

## 2022-11-13 MED ORDER — CLOPIDOGREL BISULFATE 75 MG PO TABS: 75.0000 mg | ORAL_TABLET | Freq: Every day | ORAL | 2 refills | Status: DC

## 2022-11-13 NOTE — Progress Notes (Unsigned)
History of Present Illness:  Patient is a 89 y.o. year old female who presents for evaluation of carotid stenosis.    S/P right CEA for symptomatic stenosis 10/21/19 by Dr. Donzetta Matters.   The patient denies symptoms of TIA, amaurosis, or stroke.   She has been fishing recently in [facility] and had Cholecystis.  She is well now and accompanied by her daughter.     She has decreased vision and hearing, but other wise in stable condition and able to participate in daily activities.       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 ROk Edwardsdiverticulosis, negative microscopic  colitis    COLONOSCOPY WITH PROPOFOL N/A 11/27/2020   Surgeon: CHarvel Quale MD;  diverticulosis, no active bleeding.  Two 2-4 mm polyps not removed in the setting of Plavix.   CORONARY STENT INTERVENTION N/A 09/08/2020   Procedure: CORONARY STENT INTERVENTION;   Surgeon: CSherren Mocha MD;  [facility];  Service: Cardiovascular;  Laterality: N/A;   ENDARTERECTOMY Right 10/21/2019   Procedure: ENDARTERECTOMY CAROTID RIGHT;  Surgeon: CWaynetta Sandy MD;  [facility]  Service: Vascular;  Laterality: Right;   ESOPHAGOGASTRODUODENOSCOPY  04/03/2012   Dr. RJennet Madurohernia, chronic gastritis on bx   ESOPHAGOGASTRODUODENOSCOPY (EGD) WITH PROPOFOL N/A 04/28/2020   Procedure: ESOPHAGOGASTRODUODENOSCOPY (EGD) WITH PROPOFOL;  Surgeon: BOtis Brace MD;  [facility]  Service: Gastroenterology;  Laterality: N/A;   ESOPHAGOGASTRODUODENOSCOPY (EGD) WITH PROPOFOL N/A 11/27/2020   Surgeon: CMontez Morita DQuillian Quince MD; findings suggestive of short segment Barrett's, normal stomach, and small bowel.   FEMORAL ARTERY EXPLORATION Right 09/28/2020   Procedure: RIGHT GROIN EXPLORATION WITH REPAIR RIGHT COMMON FEMORAL ARTERY;  Surgeon: ERosetta Posner MD;  [facility];  Service: Vascular;  Laterality: Right;   GIVENS CAPSULE STUDY  05/09/2012   RMR: an unclear raised area of small bowel was noted, with features almost  characteristic of very small polyp. This was without villous  blunting or any evidence of active bleeding; yet, the area of  concern appeared to be erythematous. However, this could simply  be a light reflection on a normal variation of the small bowel. REFERRED TO DR.  GILLIAM, APPT FOR NOVEMBER 2013.    GIVENS CAPSULE STUDY N/A 04/30/2020   Procedure: GIVENS CAPSULE STUDY;  Surgeon: Otis Brace, MD;  Location: [place];  Service: Gastroenterology;  Laterality: N/A;   [place] STUDY  11/27/2020    Surgeon: Montez Morita, Quillian Quince, MD;  questionable nodule/small mass in the distal small bowel but without ulceration.  Evidence of hematochezia throughout the colon but no fresh blood.    HIP ARTHROPLASTY Right    INSERT / REPLACE / REMOVE PACEMAKER     IR CHOLANGIOGRAM EXISTING TUBE   07/13/2022   IR REMOVAL BILIARY DRAIN  08/09/2022   PACEMAKER IMPLANT N/A 07/12/2021   Procedure: PACEMAKER IMPLANT;  Surgeon: Vickie Epley, MD;  Location: [place] [facility];  Service: Cardiovascular;  Laterality: N/A;   PATCH ANGIOPLASTY Right 10/21/2019   Procedure: PATCH ANGIOPLASTY USING Rueben Bash BIOLOGIC PATCH;  Surgeon: Waynetta Sandy, MD;  Location: [place];  Service: Vascular;  Laterality: Right;   POLYPECTOMY  04/28/2020   Procedure: POLYPECTOMY;  Surgeon: Otis Brace, MD;  Location: [facility];  Service: Gastroenterology;;   RIGHT/LEFT HEART CATH AND CORONARY ANGIOGRAPHY N/A 09/08/2020   Procedure: RIGHT/LEFT HEART CATH AND CORONARY ANGIOGRAPHY;  Surgeon: Sherren Mocha, MD;  Location: [place] [facility];  Service: Cardiovascular;  Laterality: N/A;   TEE WITHOUT CARDIOVERSION N/A 09/28/2020   Procedure: TRANSESOPHAGEAL ECHOCARDIOGRAM (TEE);  Surgeon: Sherren Mocha, MD;  Location: [place];  Service: Open Heart Surgery;  Laterality: N/A;   TONSILLECTOMY     TOTAL HIP ARTHROPLASTY  1995   Right   TRANSCATHETER AORTIC VALVE REPLACEMENT, TRANSFEMORAL N/A 09/28/2020   Procedure: TRANSCATHETER AORTIC VALVE REPLACEMENT, TRANSFEMORAL;  Surgeon: Sherren Mocha, MD;  Location: [facility];  Service: Open Heart Surgery;  Laterality: N/A;     Social History Social History   Tobacco Use   Smoking status: Never    Passive exposure: Never   Smokeless tobacco: Never   Tobacco comments:    Quit x 50 years  Vaping Use   Vaping Use: Never used  Substance Use Topics   Alcohol use: Never   Drug use: Never    Family History Family History  Problem Relation Age of Onset   Cirrhosis Mother        no etoh use   CAD Father    Diabetes Mother        + brother   Heart failure Father    Hypertension Father        + brother   Cancer Brother        lung, age 26   Uterine cancer Sister    Colon cancer Neg Hx     Allergies  No Known Allergies   Current Outpatient  Medications  Medication Sig Dispense Refill   acetaminophen (TYLENOL) 500 MG tablet Take 500 mg by mouth every 8 (eight) hours as needed for mild pain.     ALPRAZolam (XANAX) 0.5 MG tablet Take 0.5 mg by mouth at bedtime.     Cholecalciferol (VITAMIN D3 PO) Take by mouth.     clopidogrel (PLAVIX) 75 MG tablet TAKE ONE TABLET (75 MG TOTAL) BY MOUTH DAILY WITH BREAKFAST. (Patient taking differently: Take 75 mg by mouth daily.) 90 tablet 3   FEROSUL 325 (65 Fe) MG tablet TAKE ONE TABLET ('325MG'$  TOTAL) BY MOUTH DAILY 100 tablet 1   glimepiride (AMARYL) 1 MG tablet Take 1 mg by mouth daily with breakfast.     Glycerin-Hypromellose-PEG 400 (VISINE DRY EYE OP) Place 1  drop into both eyes daily as needed (for dry eye relief).     meclizine (ANTIVERT) 12.5 MG tablet Take 1 tablet (12.5 mg total) by mouth 3 (three) times daily as needed for dizziness. 20 tablet 0   metoprolol tartrate (LOPRESSOR) 25 MG tablet Take 0.5 tablets (12.5 mg total) by mouth 2 (two) times daily. 30 tablet 0   Multiple Vitamin (MULTIVITAMIN) tablet Take 1 tablet by mouth daily.     Multiple Vitamins-Minerals (PRESERVISION AREDS 2 PO) Take 1 tablet by mouth daily. Takes 1 tablet in the morning and 1 tablet at night for eyes.     pantoprazole (PROTONIX) 40 MG tablet TAKE ONE TABLET (40 MG TOTAL) BY MOUTH ONCE DAILY. 90 tablet 3   vitamin C (ASCORBIC ACID) 500 MG tablet Take 500 mg by mouth daily.     No current facility-administered medications for this visit.    ROS:   General:  No weight loss, Fever, chills  HEENT: No recent headaches, no nasal bleeding, no visual changes, no sore throat  Neurologic: No dizziness, blackouts, seizures. No recent symptoms of stroke or mini- stroke. No recent episodes of slurred speech, or temporary blindness.  Cardiac: No recent episodes of chest pain/pressure, no shortness of breath at rest.  No shortness of breath with exertion.  Denies history of atrial fibrillation or irregular  heartbeat  Vascular: No history of rest pain in feet.  No history of claudication.  No history of non-healing ulcer, No history of DVT   Pulmonary: No home oxygen, no productive cough, no hemoptysis,  No asthma or wheezing  Musculoskeletal:  '[ ]'$  Arthritis, '[ ]'$  Low back pain,  [x ] Joint pain  Hematologic:No history of hypercoagulable state.  No history of easy bleeding.  No history of anemia  Gastrointestinal: No hematochezia or melena,  No gastroesophageal reflux, no trouble swallowing  Urinary: '[ ]'$  chronic Kidney disease, '[ ]'$  on HD - '[ ]'$  MWF or '[ ]'$  TTHS, '[ ]'$  Burning with urination, '[ ]'$  Frequent urination, '[ ]'$  Difficulty urinating;   Skin: No rashes  Psychological: No history of anxiety,  No history of depression   Physical Examination  Vitals:   11/13/22 1500 11/13/22 1501  BP: (!) 127/56 (!) 125/57  Pulse: 60   Resp: 20   Temp: 98 F (36.7 C)   TempSrc: Temporal   SpO2: 95%   Weight: 167 lb (75.8 kg)   Height: '5\' 6"'$  (1.676 m)     Body mass index is 26.95 kg/m.  General:  Alert and oriented, no acute distress HEENT: Normal Neck: No bruit or JVD Pulmonary: Clear to auscultation bilaterally Cardiac: Regular Rate and Rhythm without murmur Gastrointestinal: Soft, non-tender, non-distended, no mass, no scars Skin: No rash Extremity Pulses:  palpable radial pulses B UE Musculoskeletal: No deformity or edema  Neurologic: Upper and lower extremity motor 5/5 and symmetric  DATA:  Right Carotid Findings:  +----------+--------+--------+--------+--------------------------+--------+            PSV cm/sEDV cm/sStenosisPlaque Description         Comments  +----------+--------+--------+--------+--------------------------+--------+   CCA Prox  77      9                                                    +----------+--------+--------+--------+--------------------------+--------+   CCA Distal84      17  heterogenous and irregular            +----------+--------+--------+--------+--------------------------+--------+   ICA Prox  62      10              smooth and heterogenous              +----------+--------+--------+--------+--------------------------+--------+   ICA Mid   77      17                                                   +----------+--------+--------+--------+--------------------------+--------+   ICA Distal114     22                                                   +----------+--------+--------+--------+--------------------------+--------+   ECA      55      4               heterogenous and calcific            +----------+--------+--------+--------+--------------------------+--------+    +----------+--------+-------+----------------+-------------------+           PSV cm/sEDV cmsDescribe        Arm Pressure (mmHG)  +----------+--------+-------+----------------+-------------------+  [id number]            Multiphasic, [id number]                  +----------+--------+-------+----------------+-------------------+   +---------+--------+--+--------+-+---------+  VertebralPSV cm/s42EDV cm/s9Antegrade  +---------+--------+--+--------+-+---------+      Left Carotid Findings:  +----------+--------+--------+--------+------------------------------+-----  ----+           PSV cm/sEDV cm/sStenosisPlaque Description             Comments   +----------+--------+--------+--------+------------------------------+-----  ----+  CCA Prox  108     18                                                        +----------+--------+--------+--------+------------------------------+-----  ----+  CCA Distal79      19                                                        +----------+--------+--------+--------+------------------------------+-----  ----+  ICA Prox  52      13              heterogenous, irregular and    Shadowing                                    calcific                                  +----------+--------+--------+--------+------------------------------+-----  ----+  ICA Mid   78      17                                                        +----------+--------+--------+--------+------------------------------+-----  ----+  ICA Distal95      22                                                        +----------+--------+--------+--------+------------------------------+-----  ----+  ECA      148     12                                                        +----------+--------+--------+--------+------------------------------+-----  ----+   +----------+--------+--------+----------------+-------------------+           PSV cm/sEDV cm/sDescribe        Arm Pressure (mmHG)  +----------+--------+--------+----------------+-------------------+  [id number]             Multiphasic, [id number]                  +----------+--------+--------+----------------+-------------------+   +---------+--------+--+--------+--+---------+  VertebralPSV cm/s54EDV cm/s13Antegrade  +---------+--------+--+--------+--+---------+         Summary:  Right Carotid: Velocities in the right ICA are consistent with a 1-39%  stenosis.   Left Carotid: Velocities in the left ICA are consistent with a 1-39%  stenosis.   Vertebrals: Bilateral vertebral arteries demonstrate antegrade flow.  Subclavians: Normal flow hemodynamics were seen in bilateral subclavian               arteries.    ASSESSMENT/PLAN: Carotid stenosis with history of asymptomatic right ICA stenosis s/p CEA 10/21/19 by Dr. Donzetta Matters.    The carotid duplex demonstrates B ICA < 39% stenosis.  She remains asymptomatic for stroke/TIA symptoms.  I will have her f/u in 1 year for repeat surveillance duplex.     Roxy Horseman PA-C [facility] Office: [phone]  MD in clinic [place]

## 2023-01-10 ENCOUNTER — Ambulatory Visit (INDEPENDENT_AMBULATORY_CARE_PROVIDER_SITE_OTHER): Payer: 59

## 2023-01-10 DIAGNOSIS — I442 Atrioventricular block, complete: Secondary | ICD-10-CM

## 2023-01-10 LAB — CUP PACEART REMOTE DEVICE CHECK
Battery Remaining Percentage: 85 %
Brady Statistic RA Percent Paced: 39 %
Brady Statistic RV Percent Paced: 100 %
Date Time Interrogation Session: 20240424070612
Implantable Lead Connection Status: 753985
Implantable Lead Connection Status: 753985
Implantable Lead Implant Date: 20221026
Implantable Lead Implant Date: 20221026
Implantable Lead Location: 753859
Implantable Lead Location: 753860
Implantable Lead Model: 377171
Implantable Lead Model: 377171
Implantable Lead Serial Number: 8000595070
Implantable Lead Serial Number: 8000606642
Implantable Pulse Generator Implant Date: 20221026
Lead Channel Impedance Value: 507 Ohm
Lead Channel Impedance Value: 566 Ohm
Lead Channel Pacing Threshold Amplitude: 0.8 V
Lead Channel Pacing Threshold Amplitude: 0.8 V
Lead Channel Pacing Threshold Pulse Width: 0.4 ms
Lead Channel Pacing Threshold Pulse Width: 0.4 ms
Lead Channel Sensing Intrinsic Amplitude: 16.3 mV
Lead Channel Sensing Intrinsic Amplitude: 2.4 mV
Lead Channel Setting Pacing Amplitude: 2 V
Lead Channel Setting Pacing Amplitude: 2.6 V
Lead Channel Setting Pacing Pulse Width: 0.4 ms
Pulse Gen Model: 407145
Pulse Gen Serial Number: 70223175

## 2023-01-23 NOTE — Progress Notes (Signed)
[facility]                                        Cardiology Office Note:    Date:  01/29/2023   ID:  Denise Parrish, DOB [birth year], MRN [id number]  PCP:  John Giovanni, MD  [facility] Cardiologist:  Charlton Haws, MD /Dr. Excell Seltzer & Dr. Laneta Simmers (TAVR) [facility] Electrophysiologist:  Lanier Prude, MD     History of Present Illness:    Denise Parrish is a 89 y.o. female with a hx of mild dementia, DMT2, GERD, HLD, HTN, obesity, emphysema, hiatal hernia, history of TIA/CVA, carotid artery disease s/p right CEA 10/2019, history of GI bleeding, severe AS s/p TAVR (09/28/20) and symptomatic CHB s/p PPM (06/2021) who presents to clinic for follow up.     She had a difficult hospitalization in August 2021 when she was admitted with a GI bleed and demand ischemia and suffered a mechanical fall in the hospital with fracture of her left fibula. She then reported progressive exertional shortness of breath and fatigue and was diagnosed with severe AS. During her work-up for aortic stenosis and consideration of TAVR she underwent cardiac catheterization on 09/08/2020 which showed a 90% left circumflex stenosis and a large caliber vessel.  This was successfully treated with drug-eluting stent, which led to an improvement in symptoms. Catheterization also showed a 60 to 70% distal RCA stenosis and mild nonobstructive LAD disease.  The mean gradient across aortic valve at catheterization was 57 mmHg with a valve area of 0.78 cm2.  She was placed on plavix monotherapy given recent GI bleed.      She was evaluated by the multidisciplinary valve team and underwent successful TAVR with a 23 mm Medtronic Evolut Pro+ THV via the TF approach on 09/28/20. Post operative echo showed normally functioning TAVR with a mean gradient of 11 mm hg and no PVL. Shortly after TAVR she developed acute right limb ischemia that required emergent exploration and primary repair right femoral artery.  She was discharged on POD 2 on monotherapy with plavix. 1 month echo showed EF 70%, normally functioning TAVR with a mean gradient of 8.3 and no PVL.     She was admitted in 11/2020 for acute GI bleed requiring transfusion. She underwent extensive GI work up and Plavix was held but ultimately restarted.     She was readmitted in 06/2021 for recurrent weak spells, near syncope, observed to have intermittent CHB with pauses. She underwent successful implantation of a Biotronik dual chamber PPM on 07/12/21 by Dr Lalla Brothers. Last device check with no arrhthymias and normal function.      She lives alone although her daughter is close by to check on her. She remains very active at baseline. She denies chest pain, palpitations, LE edema, orthopnea, dizziness, or syncope.   Echocardiogram 11/18/21  shows an LVEF at 60 to 65% with G1DD with a 23 mm CoreValve-Evolut Pro prosthetic (TAVR) valve present in the aortic position. Mean gradient 8.6 mmhg no PVL  She is more active Daughter takes her to two [employer] games/week and she has [employer] package at home to watch baseball games    Past Medical History:  Diagnosis Date   Anxiety    Carotid stenosis, right 10/16/2019   75% on CTA neck   Cholelithiasis    on u/s 09/2011   Degenerative joint disease  Right THA in 1995; cervical discectomy and fusion-2001   Diabetes mellitus, type 2 (HCC)    + neuropathy   Emphysema    Gastroesophageal reflux disease    Hiatal hernia    History of CVA (cerebrovascular accident)    Hyperlipidemia    Hypertension    IDA (iron deficiency anemia)    labs 09/2011   Mild cognitive impairment    Obesity    Osteoporosis    Pneumonia    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   s/p TAVR with a 23mm Medtronic Evolut Pro+ via the TF approach    Severe aortic stenosis    Sigmoid diverticulosis    Small bowel lesion    On Given's capsule; Dr Sharin Mons 07/2012, no further w/u needed    Past  Surgical History:  Procedure Laterality Date   ANTERIOR CERVICAL DISCECTOMY  2001   C4-5, allograft, fixation   BREAST LUMPECTOMY     benign   CATARACT EXTRACTION W/ INTRAOCULAR LENS IMPLANT  2007   Left   COLONOSCOPY  2006   COLONOSCOPY  04/03/2012   Dr. Merlyn Lot diverticulosis, negative microscopic  colitis    COLONOSCOPY WITH PROPOFOL N/A 11/27/2020   Surgeon: Dolores Frame, MD;  diverticulosis, no active bleeding.  Two 2-4 mm polyps not removed in the setting of Plavix.   CORONARY STENT INTERVENTION N/A 09/08/2020   Procedure: CORONARY STENT INTERVENTION;  Surgeon: Tonny Bollman, MD;  Location: [facility] INVASIVE CV LAB;  Service: Cardiovascular;  Laterality: N/A;   ENDARTERECTOMY Right 10/21/2019   Procedure: ENDARTERECTOMY CAROTID RIGHT;  Surgeon: Maeola Harman, MD;  Location: [facility] OR;  Service: Vascular;  Laterality: Right;   ESOPHAGOGASTRODUODENOSCOPY  04/03/2012   Dr. Dot Been hernia, chronic gastritis on bx   ESOPHAGOGASTRODUODENOSCOPY (EGD) WITH PROPOFOL N/A 04/28/2020   Procedure: ESOPHAGOGASTRODUODENOSCOPY (EGD) WITH PROPOFOL;  Surgeon: Kathi Der, MD;  Location: [facility] ENDOSCOPY;  Service: Gastroenterology;  Laterality: N/A;   ESOPHAGOGASTRODUODENOSCOPY (EGD) WITH PROPOFOL N/A 11/27/2020   Surgeon: Marguerita Merles, Reuel Boom, MD; findings suggestive of short segment Barrett's, normal stomach, and small bowel.   FEMORAL ARTERY EXPLORATION Right 09/28/2020   Procedure: RIGHT GROIN EXPLORATION WITH REPAIR RIGHT COMMON FEMORAL ARTERY;  Surgeon: Larina Earthly, MD;  Location: [facility] OR;  Service: Vascular;  Laterality: Right;   GIVENS CAPSULE STUDY  05/09/2012   RMR: an unclear raised area of small bowel was noted, with features almost  characteristic of very small polyp. This was without villous  blunting or any evidence of active bleeding; yet, the area of  concern appeared to be erythematous. However, this could simply  be a light reflection on a normal  variation of the small bowel. REFERRED TO DR. GILLIAM, APPT FOR NOVEMBER 2013.    GIVENS CAPSULE STUDY N/A 04/30/2020   Procedure: GIVENS CAPSULE STUDY;  Surgeon: Kathi Der, MD;  Location: [facility] ENDOSCOPY;  Service: Gastroenterology;  Laterality: N/A;   GIVENS CAPSULE STUDY  11/27/2020    Surgeon: Marguerita Merles, Reuel Boom, MD;  questionable nodule/small mass in the distal small bowel but without ulceration.  Evidence of hematochezia throughout the colon but no fresh blood.    HIP ARTHROPLASTY Right    INSERT / REPLACE / REMOVE PACEMAKER     IR CHOLANGIOGRAM EXISTING TUBE  07/13/2022   IR REMOVAL BILIARY DRAIN  08/09/2022   PACEMAKER IMPLANT N/A 07/12/2021   Procedure: PACEMAKER IMPLANT;  Surgeon: Lanier Prude, MD;  Location: [facility] INVASIVE CV LAB;  Service: Cardiovascular;  Laterality: N/A;   PATCH  ANGIOPLASTY Right 10/21/2019   Procedure: PATCH ANGIOPLASTY USING Livia Snellen BIOLOGIC PATCH;  Surgeon: Maeola Harman, MD;  Location: [facility] OR;  Service: Vascular;  Laterality: Right;   POLYPECTOMY  04/28/2020   Procedure: POLYPECTOMY;  Surgeon: Kathi Der, MD;  Location: [facility] ENDOSCOPY;  Service: Gastroenterology;;   RIGHT/LEFT HEART CATH AND CORONARY ANGIOGRAPHY N/A 09/08/2020   Procedure: RIGHT/LEFT HEART CATH AND CORONARY ANGIOGRAPHY;  Surgeon: Tonny Bollman, MD;  Location: [facility] INVASIVE CV LAB;  Service: Cardiovascular;  Laterality: N/A;   TEE WITHOUT CARDIOVERSION N/A 09/28/2020   Procedure: TRANSESOPHAGEAL ECHOCARDIOGRAM (TEE);  Surgeon: Tonny Bollman, MD;  Location: [facility] OR;  Service: Open Heart Surgery;  Laterality: N/A;   TONSILLECTOMY     TOTAL HIP ARTHROPLASTY  1995   Right   TRANSCATHETER AORTIC VALVE REPLACEMENT, TRANSFEMORAL N/A 09/28/2020   Procedure: TRANSCATHETER AORTIC VALVE REPLACEMENT, TRANSFEMORAL;  Surgeon: Tonny Bollman, MD;  Location: [facility] OR;  Service: Open Heart Surgery;  Laterality: N/A;    Current Medications: Current Meds  Medication Sig    acetaminophen (TYLENOL) 500 MG tablet Take 500 mg by mouth every 8 (eight) hours as needed for mild pain.   ALPRAZolam (XANAX) 0.5 MG tablet Take 0.5 mg by mouth at bedtime.   Cholecalciferol (VITAMIN D3 PO) Take by mouth.   clopidogrel (PLAVIX) 75 MG tablet Take 1 tablet (75 mg total) by mouth daily. TAKE ONE TABLET (75 MG TOTAL) BY MOUTH DAILY WITH BREAKFAST. Strength: 75 mg   FEROSUL 325 (65 Fe) MG tablet TAKE ONE TABLET (325MG  TOTAL) BY MOUTH DAILY   glimepiride (AMARYL) 1 MG tablet Take 1 mg by mouth daily with breakfast.   Glycerin-Hypromellose-PEG 400 (VISINE DRY EYE OP) Place 1 drop into both eyes daily as needed (for dry eye relief).   meclizine (ANTIVERT) 12.5 MG tablet Take 1 tablet (12.5 mg total) by mouth 3 (three) times daily as needed for dizziness.   metoprolol tartrate (LOPRESSOR) 25 MG tablet Take 0.5 tablets (12.5 mg total) by mouth 2 (two) times daily.   Multiple Vitamin (MULTIVITAMIN) tablet Take 1 tablet by mouth daily.   Multiple Vitamins-Minerals (PRESERVISION AREDS 2 PO) Take 1 tablet by mouth daily. Takes 1 tablet in the morning and 1 tablet at night for eyes.   pantoprazole (PROTONIX) 40 MG tablet TAKE ONE TABLET (40 MG TOTAL) BY MOUTH ONCE DAILY.   vitamin C (ASCORBIC ACID) 500 MG tablet Take 500 mg by mouth daily.     Allergies:   Patient has no known allergies.   Social History   Socioeconomic History   Marital status: Divorced    Spouse name: Not on file   Number of children: 4   Years of education: 6th grade   Highest education level: Not on file  Occupational History   Occupation: Retired    Comment: [employer]  Tobacco Use   Smoking status: Never    Passive exposure: Never   Smokeless tobacco: Never   Tobacco comments:    Quit x 50 years  Vaping Use   Vaping Use: Never used  Substance and Sexual Activity   Alcohol use: Never   Drug use: Never   Sexual activity: Not Currently  Other Topics Concern   Not on file  Social History Narrative   Lives  alone. Every other week she has family with her. Daughter and son take care of her during the days.    Right-handed.   No daily use of caffeine.  Social Determinants of Health   Financial Resource Strain: Not on file  Food Insecurity: No Food Insecurity (06/17/2022)   Hunger Vital Sign    Worried About Running Out of Food in the Last Year: Never true    Ran Out of Food in the Last Year: Never true  Transportation Needs: No Transportation Needs (06/17/2022)   PRAPARE - Administrator, Civil Service (Medical): No    Lack of Transportation (Non-Medical): No  Physical Activity: Not on file  Stress: Not on file  Social Connections: Not on file     Family History: The patient's family history includes CAD in her father; Cancer in her brother; Cirrhosis in her mother; Diabetes in her mother; Heart failure in her father; Hypertension in her father; Uterine cancer in her sister. There is no history of Colon cancer.  ROS:   Please see the history of present illness.    All other systems reviewed and are negative.  EKGs/Labs/Other Studies Reviewed:    The following studies were reviewed today:  Echocardiogram 11/18/21:   1. Compared to 07/12/21.   2. Left ventricular ejection fraction, by estimation, is 60 to 65%. The  left ventricle has normal function. The left ventricle has no regional  wall motion abnormalities. Left ventricular diastolic parameters are  consistent with Grade I diastolic  dysfunction (impaired relaxation). Elevated left atrial pressure.   3. Right ventricular systolic function is normal. The right ventricular  size is normal.   4. Left atrial size was mildly dilated.   5. The mitral valve is normal in structure. Mild mitral valve  regurgitation. Moderate mitral stenosis. Moderate mitral annular  calcification.   6. The aortic valve has been repaired/replaced. Aortic valve  regurgitation is not visualized. No aortic stenosis is present.  There is a  23 mm CoreValve-Evolut Pro prosthetic (TAVR) valve present in the aortic  position. Procedure Date: 09/28/20. Echo  findings are consistent with normal structure and function of the aortic  valve prosthesis.   7. The inferior vena cava is normal in size with greater than 50%  respiratory variability, suggesting right atrial pressure of 3 mmHg.   Operateive NOTE     Date of Procedure:                 09/28/2020   Preoperative Diagnosis:      Severe Aortic Stenosis    Postoperative Diagnosis:    Same    Procedure:        Transcatheter Aortic Valve Replacement - Percutaneous Right Transfemoral Approach             Medtronic Evolut-PRO+  (size 23 mm, model # EVPROPLUS-23US, serial # [id number])              Co-Surgeons:            Alleen Borne, MD and Tonny Bollman, MD     Anesthesiologist:                  Sandford Craze, MD   Echocardiographer:              Eloy End, MD   Pre-operative Echo Findings: Severe aortic stenosis   Normal left ventricular systolic function   Post-operative Echo Findings: No paravalvular leak Normal left ventricular systolic function Normal mean prosthetic transvalvular gradient of 16 mm Hg.   ________________________   Echo 09/29/20:  IMPRESSIONS   1. The mitral valve is degenerative. Mild mitral valve regurgitation.  Moderate mitral stenosis.  Severe mitral annular calcification.   2. Post TAVR with 23 mm supra annular Medtronic Evolut valve No  significant PVL mean gradient 11 peak 19 mmHg lower than implant DVI 0.69  and AVA 2.1 cm2. The aortic valve has been repaired/replaced.   3. There is severely elevated pulmonary artery systolic pressure.   4. The inferior vena cava is dilated in size with >50% respiratory  variability, suggesting right atrial pressure of 8 mmHg.  ____________________   Echo 10/25/20 IMPRESSIONS   1. Left ventricular ejection fraction, by estimation, is 70 to 75%. The  left ventricle has hyperdynamic  function. The left ventricle has no  regional wall motion abnormalities. There is mild left ventricular  hypertrophy. Left ventricular diastolic  parameters are consistent with Grade I diastolic dysfunction (impaired  relaxation). Elevated left atrial pressure.   2. Right ventricular systolic function is normal. The right ventricular  size is normal. There is mildly elevated pulmonary artery systolic  pressure.   3. Left atrial size was mildly dilated.   4. Right atrial size was mildly dilated.   5. The mitral valve is abnormal. No evidence of mitral valve  regurgitation. Mild mitral stenosis.   6. Medtronic Evolut-PRO+ size 23 mm valve is in the AV position. The  prosthetic valve has normal function. . The aortic valve has been  repaired/replaced. Aortic valve regurgitation is not visualized. No aortic  stenosis is present.   7. The inferior vena cava is normal in size with greater than 50%  respiratory variability, suggesting right atrial pressure of 3 mmHg.   EKG:  01/29/2023 AV pacing rate 61 LAD   Recent Labs: 06/15/2022: Magnesium 1.9 06/17/2022: ALT 99; BUN 16; Creatinine, Ser 1.10; Hemoglobin 10.6; Platelets 398; Potassium 4.4; Sodium 137   Recent Lipid Panel    Component Value Date/Time   CHOL 100 04/08/2020 0441   TRIG 83 04/08/2020 0441   HDL 35 (L) 04/08/2020 0441   CHOLHDL 2.9 04/08/2020 0441   VLDL 17 04/08/2020 0441   LDLCALC 48 04/08/2020 0441   Physical Exam:    VS:  BP 138/70   Pulse 61   Ht 5\' 6"  (1.676 m)   Wt 169 lb 3.2 oz (76.7 kg)   SpO2 97%   BMI 27.31 kg/m     Wt Readings from Last 3 Encounters:  01/29/23 169 lb 3.2 oz (76.7 kg)  11/13/22 167 lb (75.8 kg)  08/28/22 162 lb (73.5 kg)    Elderly female Hard of hearing with aids in place  Prior right CEA Lungs clear SEM through TAVR valve no AR PPM under left clavicle Trace edema Good pedal pulses Benign abdomen Non focal neuro  ASSESSMENT/PLAN:    Severe AS s/p TAVR:Continue plavix  Normal function by TTE  11/18/21 with no PVL and mean gradient 8.6 mmHg SBE prophylaxis    CAD: pre TAVR cath showed severe 90% left circumflex stenosis (large caliber vessel) with successful PCI using a 3.5 x 12 mm resolute Onyx DES. There was moderate to severe distal RCA stenosis of 60 to 70% and mild nonobstructive LAD stenosis. Continue plavix     Hx of GI bleeding with anemia: No recent recurrence. Last Hct in Epic 32.5 06/17/22 .   Paroxsymal AV block: s/p PPM placement with Biotronik dual chamber PPM on 07/12/21 by Dr Lalla Brothers. Last interrogation with normal device functioning. 01/10/23 PACEART reviewed   PV:  prior Right CEA 10/21/19 Duplex 11/13/22 plaque no residual stenosis   F/U EP Lambert 6 months F/U  me in a year   Signed, Charlton Haws, MD  01/29/2023 1:14 PM    [facility]

## 2023-01-29 ENCOUNTER — Encounter: Payer: Self-pay | Admitting: Cardiovascular Disease

## 2023-01-29 ENCOUNTER — Ambulatory Visit: Payer: 59 | Attending: Cardiovascular Disease | Admitting: Cardiovascular Disease

## 2023-01-29 VITALS — BP 138/70 | HR 61 | Ht 66.0 in | Wt 169.2 lb

## 2023-01-29 DIAGNOSIS — Z9889 Other specified postprocedural states: Secondary | ICD-10-CM

## 2023-01-29 DIAGNOSIS — I25810 Atherosclerosis of coronary artery bypass graft(s) without angina pectoris: Secondary | ICD-10-CM

## 2023-01-29 DIAGNOSIS — I6521 Occlusion and stenosis of right carotid artery: Secondary | ICD-10-CM | POA: Diagnosis not present

## 2023-01-29 DIAGNOSIS — Z950 Presence of cardiac pacemaker: Secondary | ICD-10-CM

## 2023-01-29 DIAGNOSIS — Z952 Presence of prosthetic heart valve: Secondary | ICD-10-CM | POA: Diagnosis not present

## 2023-01-29 NOTE — Patient Instructions (Signed)
Medication Instructions:  Your physician recommends that you continue on your current medications as directed. Please refer to the Current Medication list given to you today.  *If you need a refill on your cardiac medications before your next appointment, please call your pharmacy*   Lab Work: NONE   If you have labs (blood work) drawn today and your tests are completely normal, you will receive your results only by: MyChart Message (if you have MyChart) OR A paper copy in the mail If you have any lab test that is abnormal or we need to change your treatment, we will call you to review the results.   Testing/Procedures: NONE    Follow-Up: At [facility], you and your health needs are our priority.  As part of our continuing mission to provide you with exceptional heart care, we have created designated Provider Care Teams.  These Care Teams include your primary Cardiologist (physician) and Advanced Practice Providers (APPs -  Physician Assistants and Nurse Practitioners) who all work together to provide you with the care you need, when you need it.  We recommend signing up for the patient portal called "MyChart".  Sign up information is provided on this After Visit Summary.  MyChart is used to connect with patients for Virtual Visits (Telemedicine).  Patients are able to view lab/test results, encounter notes, upcoming appointments, etc.  Non-urgent messages can be sent to your provider as well.   To learn more about what you can do with MyChart, go to [email or web address].    Your next appointment:   6 month(s)  Provider:   Peter Nishan, MD    Other Instructions Thank you for choosing [facility]!    

## 2023-02-06 NOTE — Progress Notes (Signed)
Remote pacemaker transmission.   

## 2023-04-10 ENCOUNTER — Other Ambulatory Visit (INDEPENDENT_AMBULATORY_CARE_PROVIDER_SITE_OTHER): Payer: Self-pay | Admitting: Gastroenterology

## 2023-04-10 DIAGNOSIS — D509 Iron deficiency anemia, unspecified: Secondary | ICD-10-CM

## 2023-04-11 ENCOUNTER — Ambulatory Visit (INDEPENDENT_AMBULATORY_CARE_PROVIDER_SITE_OTHER): Payer: 59

## 2023-04-11 DIAGNOSIS — I442 Atrioventricular block, complete: Secondary | ICD-10-CM

## 2023-04-12 LAB — CUP PACEART REMOTE DEVICE CHECK
Battery Remaining Percentage: 85 %
Brady Statistic RA Percent Paced: 38 %
Brady Statistic RV Percent Paced: 100 %
Date Time Interrogation Session: 20240724073717
Implantable Lead Connection Status: 753985
Implantable Lead Connection Status: 753985
Implantable Lead Implant Date: 20221026
Implantable Lead Implant Date: 20221026
Implantable Lead Location: 753859
Implantable Lead Location: 753860
Implantable Lead Model: 377171
Implantable Lead Serial Number: 8000595070
Implantable Lead Serial Number: 8000606642
Implantable Pulse Generator Implant Date: 20221026
Lead Channel Impedance Value: 488 Ohm
Lead Channel Impedance Value: 566 Ohm
Lead Channel Pacing Threshold Amplitude: 0.8 V
Lead Channel Pacing Threshold Amplitude: 0.8 V
Lead Channel Pacing Threshold Pulse Width: 0.4 ms
Lead Channel Pacing Threshold Pulse Width: 0.4 ms
Lead Channel Sensing Intrinsic Amplitude: 15.6 mV
Lead Channel Sensing Intrinsic Amplitude: 2.6 mV
Lead Channel Setting Pacing Amplitude: 2 V
Lead Channel Setting Pacing Pulse Width: 0.4 ms
Pulse Gen Model: 407145
Pulse Gen Serial Number: 70223175

## 2023-04-24 NOTE — Progress Notes (Signed)
Remote pacemaker transmission.   

## 2023-05-18 ENCOUNTER — Encounter (HOSPITAL_COMMUNITY): Payer: Self-pay

## 2023-05-18 ENCOUNTER — Emergency Department (HOSPITAL_COMMUNITY)
Admission: EM | Admit: 2023-05-18 | Discharge: 2023-05-19 | Disposition: A | Discharge status: Home / Self Care | Payer: 59

## 2023-05-18 ENCOUNTER — Emergency Department (HOSPITAL_COMMUNITY): Payer: 59

## 2023-05-18 ENCOUNTER — Other Ambulatory Visit: Payer: Self-pay

## 2023-05-18 DIAGNOSIS — Z7902 Long term (current) use of antithrombotics/antiplatelets: Secondary | ICD-10-CM | POA: Diagnosis not present

## 2023-05-18 DIAGNOSIS — R0602 Shortness of breath: Secondary | ICD-10-CM | POA: Insufficient documentation

## 2023-05-18 DIAGNOSIS — U071 COVID-19: Secondary | ICD-10-CM | POA: Insufficient documentation

## 2023-05-18 DIAGNOSIS — R112 Nausea with vomiting, unspecified: Secondary | ICD-10-CM | POA: Diagnosis present

## 2023-05-18 LAB — CBC WITH DIFFERENTIAL/PLATELET
Abs Immature Granulocytes: 0.03 10*3/uL (ref 0.00–0.07)
Basophils Absolute: 0 10*3/uL (ref 0.0–0.1)
Basophils Relative: 0 %
Eosinophils Absolute: 0.1 10*3/uL (ref 0.0–0.5)
Eosinophils Relative: 1 %
HCT: 35.8 % — ABNORMAL LOW (ref 36.0–46.0)
Hemoglobin: 12.1 g/dL (ref 12.0–15.0)
Immature Granulocytes: 0 %
Lymphocytes Relative: 7 %
Lymphs Abs: 0.5 10*3/uL — ABNORMAL LOW (ref 0.7–4.0)
MCH: 31.9 pg (ref 26.0–34.0)
MCHC: 33.8 g/dL (ref 30.0–36.0)
MCV: 94.5 fL (ref 80.0–100.0)
Monocytes Absolute: 0.6 10*3/uL (ref 0.1–1.0)
Monocytes Relative: 8 %
Neutro Abs: 5.8 10*3/uL (ref 1.7–7.7)
Neutrophils Relative %: 84 %
Platelets: 170 10*3/uL (ref 150–400)
RBC: 3.79 MIL/uL — ABNORMAL LOW (ref 3.87–5.11)
RDW: 13 % (ref 11.5–15.5)
WBC: 7 10*3/uL (ref 4.0–10.5)
nRBC: 0 % (ref 0.0–0.2)

## 2023-05-18 LAB — URINALYSIS, W/ REFLEX TO CULTURE (INFECTION SUSPECTED)
Bacteria, UA: NONE SEEN
Bilirubin Urine: NEGATIVE
Glucose, UA: 50 mg/dL — AB
Hgb urine dipstick: NEGATIVE
Ketones, ur: NEGATIVE mg/dL
Leukocytes,Ua: NEGATIVE
Nitrite: NEGATIVE
Protein, ur: NEGATIVE mg/dL
Specific Gravity, Urine: 1.019 (ref 1.005–1.030)
pH: 7 (ref 5.0–8.0)

## 2023-05-18 LAB — COMPREHENSIVE METABOLIC PANEL
ALT: 21 U/L (ref 0–44)
AST: 29 U/L (ref 15–41)
Albumin: 3.4 g/dL — ABNORMAL LOW (ref 3.5–5.0)
Alkaline Phosphatase: 70 U/L (ref 38–126)
Anion gap: 11 (ref 5–15)
BUN: 22 mg/dL (ref 8–23)
CO2: 21 mmol/L — ABNORMAL LOW (ref 22–32)
Calcium: 8.9 mg/dL (ref 8.9–10.3)
Chloride: 101 mmol/L (ref 98–111)
Creatinine, Ser: 1.16 mg/dL — ABNORMAL HIGH (ref 0.44–1.00)
GFR, Estimated: 43 mL/min — ABNORMAL LOW (ref >60)
Glucose, Bld: 217 mg/dL — ABNORMAL HIGH (ref 70–99)
Potassium: 4.1 mmol/L (ref 3.5–5.1)
Sodium: 133 mmol/L — ABNORMAL LOW (ref 135–145)
Total Bilirubin: 1.1 mg/dL (ref 0.3–1.2)
Total Protein: 6.3 g/dL — ABNORMAL LOW (ref 6.5–8.1)

## 2023-05-18 LAB — LIPASE, BLOOD: Lipase: 31 U/L (ref 11–51)

## 2023-05-18 LAB — RESP PANEL BY RT-PCR (RSV, FLU A&B, COVID)  RVPGX2
Influenza A by PCR: NEGATIVE
Influenza B by PCR: NEGATIVE
Resp Syncytial Virus by PCR: NEGATIVE
SARS Coronavirus 2 by RT PCR: POSITIVE — AB

## 2023-05-18 LAB — LACTIC ACID, PLASMA: Lactic Acid, Venous: 3.2 mmol/L (ref 0.5–1.9)

## 2023-05-18 LAB — PROTIME-INR
INR: 1 (ref 0.8–1.2)
Prothrombin Time: 13.6 seconds (ref 11.4–15.2)

## 2023-05-18 LAB — APTT: aPTT: 24 seconds (ref 24–36)

## 2023-05-18 MED ORDER — SODIUM CHLORIDE 0.9 % IV BOLUS
500.0000 mL | Freq: Once | INTRAVENOUS | Status: AC
  Administered 2023-05-18: 500 mL via INTRAVENOUS

## 2023-05-18 MED ORDER — IOHEXOL 300 MG/ML  SOLN
80.0000 mL | Freq: Once | INTRAMUSCULAR | Status: AC | PRN
  Administered 2023-05-18: 80 mL via INTRAVENOUS

## 2023-05-18 MED ORDER — ACETAMINOPHEN 325 MG PO TABS
650.0000 mg | ORAL_TABLET | Freq: Once | ORAL | Status: AC
  Administered 2023-05-18: 650 mg via ORAL
  Filled 2023-05-18: qty 2

## 2023-05-18 MED ORDER — ONDANSETRON HCL 4 MG/2ML IJ SOLN
4.0000 mg | Freq: Once | INTRAMUSCULAR | Status: AC
  Administered 2023-05-18: 4 mg via INTRAVENOUS
  Filled 2023-05-18: qty 2

## 2023-05-18 NOTE — ED Triage Notes (Signed)
Pt arrived via [facility] from home c/o possible Covid infection and Gallbladder pain, however Pt reports LLQ abdominal pain upon palpitation and endorses Nausea.

## 2023-05-18 NOTE — ED Notes (Signed)
Date and time results received: 05/18/23 2220   Test: LACTIC Critical Value: 3.2  Name of Provider Notified: Estanislado Pandy MD

## 2023-05-19 LAB — LACTIC ACID, PLASMA: Lactic Acid, Venous: 1.6 mmol/L (ref 0.5–1.9)

## 2023-05-19 MED ORDER — ONDANSETRON HCL 4 MG PO TABS: 4.0000 mg | ORAL_TABLET | Freq: Four times a day (QID) | ORAL | 0 refills | Status: AC

## 2023-05-19 MED ORDER — PAXLOVID (150/100) 10 X 150 MG & 10 X 100MG PO TBPK
2.0000 | ORAL_TABLET | Freq: Two times a day (BID) | ORAL | 0 refills | Status: AC
Start: 2023-05-19 — End: 2023-05-24

## 2023-05-19 NOTE — Discharge Instructions (Addendum)
Take the Zofran as needed for nausea.  I am prescribing you Paxlovid that you may take to help treat COVID.  Please follow-up with your primary doctor.  Return immediately felt shortness of breath, lightheadedness, chest pain, inability eat or drink, passout, abdominal pain or any new or worsening symptoms that are concerning to you.

## 2023-05-19 NOTE — ED Notes (Signed)
EDP Provider at bedside.

## 2023-05-19 NOTE — ED Provider Notes (Signed)
[place] EMERGENCY DEPARTMENT AT [facility] Provider Note   CSN: [id number] Arrival date & time: 05/18/23  2054     History  Chief Complaint  Patient presents with   Covid Exposure    Denise Parrish is a 89 y.o. female.  89 year old female presenting emergency department chief complaint of fever nausea vomiting.  Symptoms started this afternoon.  Family members with similar symptoms and reported to have COVID.  Denies any headache, vision changes, chest pain some mild shortness of breath.  Denies abdominal pain only nausea.  No urinary symptoms.        Home Medications Prior to Admission medications   Medication Sig Start Date End Date Taking? Authorizing Provider  acetaminophen (TYLENOL) 500 MG tablet Take 500 mg by mouth every 8 (eight) hours as needed for mild pain.    [provider]  ALPRAZolam Prudy Feeler) 0.5 MG tablet Take 0.5 mg by mouth at bedtime.    [provider]  Cholecalciferol (VITAMIN D3 PO) Take by mouth.    [provider]  clopidogrel (PLAVIX) 75 MG tablet Take 1 tablet (75 mg total) by mouth daily. TAKE ONE TABLET (75 MG TOTAL) BY MOUTH DAILY WITH BREAKFAST. Strength: 75 mg 11/13/22   Lanier Prude, MD  FEROSUL 325 (65 Fe) MG tablet TAKE ONE TABLET (325MG  TOTAL) BY MOUTH DAILY 04/10/23   Letta Median, PA-C  glimepiride (AMARYL) 1 MG tablet Take 1 mg by mouth daily with breakfast.    [provider]  Glycerin-Hypromellose-PEG 400 (VISINE DRY EYE OP) Place 1 drop into both eyes daily as needed (for dry eye relief).    [provider]  meclizine (ANTIVERT) 12.5 MG tablet Take 1 tablet (12.5 mg total) by mouth 3 (three) times daily as needed for dizziness. 12/14/19   Raeford Razor, MD  metoprolol tartrate (LOPRESSOR) 25 MG tablet Take 0.5 tablets (12.5 mg total) by mouth 2 (two) times daily. 05/07/20   Edsel Petrin, DO  Multiple Vitamin (MULTIVITAMIN) tablet Take 1 tablet by mouth daily.     [provider]  Multiple Vitamins-Minerals (PRESERVISION AREDS 2 PO) Take 1 tablet by mouth daily. Takes 1 tablet in the morning and 1 tablet at night for eyes.    [provider]  pantoprazole (PROTONIX) 40 MG tablet TAKE ONE TABLET (40 MG TOTAL) BY MOUTH ONCE DAILY. 09/04/22   Lanier Prude, MD  vitamin C (ASCORBIC ACID) 500 MG tablet Take 500 mg by mouth daily.    [provider]      Allergies    Patient has no known allergies.    Review of Systems   Review of Systems  Physical Exam Updated Vital Signs BP (!) 110/40   Pulse 66   Temp (!) 101.7 F (38.7 C) (Oral)   Resp 13   Ht 5\' 6"  (1.676 m)   Wt 77 kg   SpO2 95%   BMI 27.40 kg/m  Physical Exam Vitals and nursing note reviewed.  Constitutional:      General: She is not in acute distress.    Appearance: She is not toxic-appearing.  HENT:     Head: Normocephalic and atraumatic.     Mouth/Throat:     Mouth: Mucous membranes are moist.  Eyes:     Extraocular Movements: Extraocular movements intact.     Conjunctiva/sclera: Conjunctivae normal.  Cardiovascular:     Rate and Rhythm: Normal rate and regular rhythm.  Pulmonary:     Effort: Pulmonary effort is  normal.     Breath sounds: Normal breath sounds.  Abdominal:     General: Abdomen is flat.     Palpations: Abdomen is soft.     Tenderness: There is abdominal tenderness (mild left sided). There is no guarding or rebound.  Musculoskeletal:        General: Normal range of motion.  Skin:    General: Skin is warm and dry.     Capillary Refill: Capillary refill takes less than 2 seconds.  Neurological:     Mental Status: She is alert and oriented to person, place, and time.  Psychiatric:        Mood and Affect: Mood normal.        Behavior: Behavior normal.     ED Results / Procedures / Treatments   Labs (all labs ordered are listed, but only abnormal results are displayed) Labs Reviewed  RESP PANEL BY RT-PCR (RSV, FLU A&B,  COVID)  [id number] - Abnormal; Notable for the following components:      Result Value   SARS Coronavirus 2 by RT PCR POSITIVE (*)    All other components within normal limits  LACTIC ACID, PLASMA - Abnormal; Notable for the following components:   Lactic Acid, Venous 3.2 (*)    All other components within normal limits  COMPREHENSIVE METABOLIC PANEL - Abnormal; Notable for the following components:   Sodium 133 (*)    CO2 21 (*)    Glucose, Bld 217 (*)    Creatinine, Ser 1.16 (*)    Total Protein 6.3 (*)    Albumin 3.4 (*)    GFR, Estimated 43 (*)    All other components within normal limits  CBC WITH DIFFERENTIAL/PLATELET - Abnormal; Notable for the following components:   RBC 3.79 (*)    HCT 35.8 (*)    Lymphs Abs 0.5 (*)    All other components within normal limits  URINALYSIS, W/ REFLEX TO CULTURE (INFECTION SUSPECTED) - Abnormal; Notable for the following components:   Color, Urine AMBER (*)    APPearance HAZY (*)    Glucose, UA 50 (*)    All other components within normal limits  CULTURE, BLOOD (ROUTINE X 2)  CULTURE, BLOOD (ROUTINE X 2)  LACTIC ACID, PLASMA  LIPASE, BLOOD  PROTIME-INR  APTT    EKG None  Radiology CT ABDOMEN PELVIS W CONTRAST  Result Date: 05/18/2023 CLINICAL DATA:  Left lower quadrant pain, nausea, sepsis EXAM: CT ABDOMEN AND PELVIS WITH CONTRAST TECHNIQUE: Multidetector CT imaging of the abdomen and pelvis was performed using the standard protocol following bolus administration of intravenous contrast. RADIATION DOSE REDUCTION: This exam was performed according to the departmental dose-optimization program which includes automated exposure control, adjustment of the mA and/or kV according to patient size and/or use of iterative reconstruction technique. CONTRAST:  80mL OMNIPAQUE IOHEXOL 300 MG/ML  SOLN COMPARISON:  05/18/2023, 06/14/2022 FINDINGS: Lower chest: No acute pleural or parenchymal lung disease. Hepatobiliary: 4.3 cm calcified gallstone is again  identified. Gallbladder is decompressed, no evidence of gallbladder wall thickening or pericholecystic fluid. No biliary duct dilation. Pancreas: Unremarkable. No pancreatic ductal dilatation or surrounding inflammatory changes. Spleen: Normal in size without focal abnormality. Adrenals/Urinary Tract: The adrenals are stable. Stable bilateral renal cortical cysts do not require specific follow-up. 5 mm nonobstructing calculus upper pole left kidney unchanged. No obstructive uropathy within either kidney. Stable extrarenal pelvis on the right. Bladder is decompressed, limiting its evaluation. Stomach/Bowel: No bowel obstruction or ileus. Normal appendix right lower quadrant. Diverticulosis  of the descending and sigmoid colon without diverticulitis. No bowel wall thickening or inflammatory change. Vascular/Lymphatic: Aortic atherosclerosis. No enlarged abdominal or pelvic lymph nodes. Reproductive: Uterus and bilateral adnexa are stable. Other: No free fluid or free intraperitoneal gas. No abdominal wall hernia. Musculoskeletal: Right hip arthroplasty. Stable left hip osteoarthritis. No acute or destructive bony abnormalities. IMPRESSION: 1. Cholelithiasis without evidence of acute cholecystitis. 2. Nonobstructing 5 mm left renal calculus. 3. Distal colonic diverticulosis without diverticulitis. 4.  Aortic Atherosclerosis (ICD10-I70.0). Electronically Signed   By: Sharlet Salina M.D.   On: 05/18/2023 23:29   DG Chest Port 1 View  Result Date: 05/18/2023 CLINICAL DATA:  Possible sepsis, question COVID. EXAM: PORTABLE CHEST 1 VIEW COMPARISON:  06/14/2022. FINDINGS: The heart size and mediastinal contours are within normal limits. TAVR stent is noted. There is atherosclerotic calcification of the aorta. No consolidation, effusion, or pneumothorax. A dual lead pacemaker is present over the left chest. Cervical spinal fusion hardware is noted. No acute osseous abnormality. IMPRESSION: No active disease. Electronically  Signed   By: Thornell Sartorius M.D.   On: 05/18/2023 21:53    Procedures Procedures    Medications Ordered in ED Medications  sodium chloride 0.9 % bolus 500 mL (0 mLs Intravenous Stopped 05/18/23 2218)  acetaminophen (TYLENOL) tablet 650 mg (650 mg Oral Given 05/18/23 2324)  ondansetron (ZOFRAN) injection 4 mg (4 mg Intravenous Given 05/18/23 2324)  sodium chloride 0.9 % bolus 500 mL (500 mLs Intravenous New Bag/Given 05/18/23 2329)  iohexol (OMNIPAQUE) 300 MG/ML solution 80 mL (80 mLs Intravenous Contrast Given 05/18/23 2256)    ED Course/ Medical Decision Making/ A&P Clinical Course as of 05/19/23 0022  Fri May 18, 2023  2211 SARS Coronavirus 2 by RT PCR(!): POSITIVE [TY]  2301 Patient presented with fever. No tachycardia tachypnea and saturating well on room air. No overt external sources of infection noted on exam. Sepsis workup initiated given patient's fever. Gentle IV fluid bolus given patient's age.  Held off on antibiotics as reportedly family all had similar URI symptoms and had tested positive for COVID.  Her labs are largely reassuring with no leukocytosis.  Did have an elevated lactate; receiving fluids. She is COVID-positive which I suspect is the cause of her fever.  Urine without evidence of UTI.  Chest x-ray without pneumonia.  Patient did have some mild tenderness on exam to her abdomen with reported nausea vomiting.  CT scan pending. [TY]  2347 CT ABDOMEN PELVIS W CONTRAST IMPRESSION: 1. Cholelithiasis without evidence of acute cholecystitis. 2. Nonobstructing 5 mm left renal calculus. 3. Distal colonic diverticulosis without diverticulitis. 4.  Aortic Atherosclerosis (ICD10-I70.0).   [TY]    Clinical Course User Index [TY] Coral Spikes, DO                                 Medical Decision Making See ED course.   Amount and/or Complexity of Data Reviewed Independent Historian:     Details: Granddaughter at bedside reported that patient ambulated to the bathroom did  not appear to be dyspneic event and oxygen saturation. External Data Reviewed:     Details: Hospitalized last year for gallbladder disease Labs: ordered. Decision-making details documented in ED Course. Radiology: ordered. Decision-making details documented in ED Course. ECG/medicine tests: ordered.  Risk OTC drugs. Prescription drug management. Decision regarding hospitalization.          Final Clinical Impression(s) / ED Diagnoses Final diagnoses:  None    Rx / DC Orders ED Discharge Orders     None         Coral Spikes, DO 05/19/23 0022

## 2023-05-23 LAB — CULTURE, BLOOD (ROUTINE X 2)
Culture: NO GROWTH
Culture: NO GROWTH
Special Requests: ADEQUATE
Special Requests: ADEQUATE

## 2023-07-11 ENCOUNTER — Ambulatory Visit (INDEPENDENT_AMBULATORY_CARE_PROVIDER_SITE_OTHER): Payer: 59

## 2023-07-11 DIAGNOSIS — I442 Atrioventricular block, complete: Secondary | ICD-10-CM | POA: Diagnosis not present

## 2023-07-11 LAB — CUP PACEART REMOTE DEVICE CHECK
Battery Remaining Percentage: 85 %
Brady Statistic RA Percent Paced: 39 %
Brady Statistic RV Percent Paced: 100 %
Date Time Interrogation Session: 20241023072421
Implantable Lead Connection Status: 753985
Implantable Lead Connection Status: 753985
Implantable Lead Implant Date: 20221026
Implantable Lead Implant Date: 20221026
Implantable Lead Location: 753859
Implantable Lead Location: 753860
Implantable Lead Model: 377171
Implantable Lead Model: 377171
Implantable Lead Serial Number: 8000595070
Implantable Lead Serial Number: 8000606642
Implantable Pulse Generator Implant Date: 20221026
Lead Channel Impedance Value: 488 Ohm
Lead Channel Impedance Value: 546 Ohm
Lead Channel Pacing Threshold Amplitude: 0.7 V
Lead Channel Pacing Threshold Amplitude: 0.8 V
Lead Channel Pacing Threshold Pulse Width: 0.4 ms
Lead Channel Pacing Threshold Pulse Width: 0.4 ms
Lead Channel Sensing Intrinsic Amplitude: 14.5 mV
Lead Channel Sensing Intrinsic Amplitude: 2.6 mV
Lead Channel Setting Pacing Amplitude: 2 V
Lead Channel Setting Pacing Amplitude: 2.6 V
Lead Channel Setting Pacing Pulse Width: 0.4 ms
Pulse Gen Model: 407145
Pulse Gen Serial Number: 70223175

## 2023-07-31 NOTE — Progress Notes (Signed)
Remote pacemaker transmission.   

## 2023-08-02 ENCOUNTER — Encounter: Payer: Self-pay | Admitting: Student

## 2023-08-02 ENCOUNTER — Ambulatory Visit: Payer: 59 | Attending: Student | Admitting: Student

## 2023-08-02 VITALS — BP 130/60 | HR 61 | Wt 165.4 lb

## 2023-08-02 DIAGNOSIS — Z9889 Other specified postprocedural states: Secondary | ICD-10-CM | POA: Diagnosis not present

## 2023-08-02 DIAGNOSIS — Z952 Presence of prosthetic heart valve: Secondary | ICD-10-CM

## 2023-08-02 DIAGNOSIS — I2510 Atherosclerotic heart disease of native coronary artery without angina pectoris: Secondary | ICD-10-CM

## 2023-08-02 DIAGNOSIS — I442 Atrioventricular block, complete: Secondary | ICD-10-CM

## 2023-08-02 NOTE — Patient Instructions (Signed)
Medication Instructions:  Your physician recommends that you continue on your current medications as directed. Please refer to the Current Medication list given to you today.  *If you need a refill on your cardiac medications before your next appointment, please call your pharmacy*   Lab Work: NONE   If you have labs (blood work) drawn today and your tests are completely normal, you will receive your results only by: MyChart Message (if you have MyChart) OR A paper copy in the mail If you have any lab test that is abnormal or we need to change your treatment, we will call you to review the results.   Testing/Procedures: NONE    Follow-Up: At [facility], you and your health needs are our priority.  As part of our continuing mission to provide you with exceptional heart care, we have created designated Provider Care Teams.  These Care Teams include your primary Cardiologist (physician) and Advanced Practice Providers (APPs -  Physician Assistants and Nurse Practitioners) who all work together to provide you with the care you need, when you need it.  We recommend signing up for the patient portal called "MyChart".  Sign up information is provided on this After Visit Summary.  MyChart is used to connect with patients for Virtual Visits (Telemedicine).  Patients are able to view lab/test results, encounter notes, upcoming appointments, etc.  Non-urgent messages can be sent to your provider as well.   To learn more about what you can do with MyChart, go to [email or web address].    Your next appointment:   6 month(s)  Provider:   Charlton Haws, MD or Randall An, PA-C    Other Instructions Thank you for choosing [facility]!

## 2023-08-02 NOTE — Progress Notes (Signed)
Cardiology Office Note    Date:  08/02/2023  ID:  Denise, Parrish 16-Sep-1923, MRN [id number] Cardiologist: Charlton Haws, MD   EP: Dr. Lalla Brothers  History of Present Illness:    Denise Parrish is a 89 y.o. female with past medical history of HTN, HLD, Type II DM, emphysema, prior TIA/CVA, carotid artery disease (s/p right CEA in 10/2019), CAD (s/p DES to LCx in 08/2020 with medical management recommended of residual disease), complete heart block (s/p PPM placement in 06/2021) and severe aortic stenosis (s/p TAVR in 09/2020) who presents to the office today for 54-month follow-up.  She was last examined by Dr. Eden Emms in 01/2023 and she was still living by herself but her daughter would check on her frequently. She denied any recent anginal symptoms. She did enjoy watching baseball games. No changes were made to her cardiac medications at that time and she was continued on Plavix 75 mg daily and Lopressor 12.5 mg twice daily.  In talking with the patient and her niece today, she reports overall doing well since her last office visit. She lives by herself but family members check on her throughout the day. She enjoys watering her flowers and cleans intermittently as well but no longer cooks due to vision issues and difficulty with seeing numbers on the stove. Family members provide meals or she uses the microwave to prepare frozen meals. She denies any recent chest pain, dyspnea on exertion, orthopnea, PND or pitting edema.  Reports having occasional palpitations in the past but no recent symptoms. Does have intermittent vertigo which improves with the use of Meclizine. Has experienced falls in the past due to severe episodes. She has remained on Plavix 75 mg daily as she reports previously having bleeding issues with ASA and has preferred Plavix over this.  Studies Reviewed:   EKG: EKG is not ordered today. EKG from 05/18/2023 is reviewed and shows A-paced rhythm, HR 60 with PAC's.    Cardiac Catheterization: 08/2020 1.  Severe 90% left circumflex stenosis (large caliber vessel) with successful PCI using a 3.5 x 12 mm resolute Onyx DES 2.  Moderate to severe distal RCA stenosis of 60 to 70%, recommend medical therapy 3.  Mild nonobstructive LAD stenosis 4.  Severe aortic stenosis with mean gradient 57 mmHg and aortic valve area 0.78 cm   Recommendations: Dual antiplatelet therapy with aspirin and clopidogrel x6 months.  Continue Cangrelor infusion x2 hours then loaded with clopidogrel 600 mg x 1.  Overnight observation in this elderly patient with severe aortic stenosis post PCI.  Continue outpatient TAVR evaluation.   Echocardiogram: 11/2021 IMPRESSIONS     1. Compared to 07/12/21.   2. Left ventricular ejection fraction, by estimation, is 60 to 65%. The  left ventricle has normal function. The left ventricle has no regional  wall motion abnormalities. Left ventricular diastolic parameters are  consistent with Grade I diastolic  dysfunction (impaired relaxation). Elevated left atrial pressure.   3. Right ventricular systolic function is normal. The right ventricular  size is normal.   4. Left atrial size was mildly dilated.   5. The mitral valve is normal in structure. Mild mitral valve  regurgitation. Moderate mitral stenosis. Moderate mitral annular  calcification.   6. The aortic valve has been repaired/replaced. Aortic valve  regurgitation is not visualized. No aortic stenosis is present. There is a  23 mm CoreValve-Evolut Pro prosthetic (TAVR) valve present in the aortic  position. Procedure Date: 09/28/20. Echo  findings are consistent with  normal structure and function of the aortic  valve prosthesis.   7. The inferior vena cava is normal in size with greater than 50%  respiratory variability, suggesting right atrial pressure of 3 mmHg.    Physical Exam:   VS:  BP 130/60   Pulse 61   Wt 165 lb 6.4 oz (75 kg)   SpO2 97%   BMI 26.70 kg/m    Wt  Readings from Last 3 Encounters:  08/02/23 165 lb 6.4 oz (75 kg)  05/18/23 169 lb 12.1 oz (77 kg)  01/29/23 169 lb 3.2 oz (76.7 kg)     GEN: Pleasant, elderly female appearing in no acute distress NECK: No JVD; No carotid bruits CARDIAC: RRR, Soft systolic murmur along RUSB.  RESPIRATORY:  Clear to auscultation without rales, wheezing or rhonchi  ABDOMEN: Appears non-distended. No obvious abdominal masses. EXTREMITIES: No clubbing or cyanosis. No pitting edema.  Distal pedal pulses are 2+ bilaterally.   Assessment and Plan:   1. Aortic Stenosis - She underwent TAVR in 09/2020 and most recent echocardiogram in 11/2021 showed this was functioning normally with no significant stenosis or regurgitation.  2. CAD - She underwent DES placement to the LCx in 08/2020 and medical management was recommended of her residual disease.  - She remains active at baseline for her age and denies any recent anginal symptoms. - She had remained on Plavix 75 mg daily as she wished to continue this over switching to ASA given prior intolerances. Continue Lopressor 12.5 mg twice daily.  Was previously on Atorvastatin 40 mg daily but reports this was discontinued by her PCP in the interim given her well-controlled cholesterol and due to her age.  3. Complete Heart Block - She does have a Biotronik PPM in place and recent interrogation last month showed normal device function. Prior interrogations did show concerns for atrial fibrillation and given her age, comorbidities and dizziness with associated falls the patient and her family decided not to pursue anticoagulation. - She is due for follow-up with Dr. Lalla Brothers in 08/2023 but a visit has not yet been scheduled. Will ask front office staff to assist with arranging this.  4. Carotid Artery Disease - She underwent right CEA in 10/2019 and dopplers in 10/2022 showed 1 to 39% stenosis bilaterally. Remains on Plavix 75 mg daily. No longer on statin therapy as  discussed above.   Signed, Ellsworth Lennox, PA-C

## 2023-08-21 ENCOUNTER — Other Ambulatory Visit: Payer: Self-pay | Admitting: Cardiology

## 2023-10-10 ENCOUNTER — Ambulatory Visit (INDEPENDENT_AMBULATORY_CARE_PROVIDER_SITE_OTHER): Payer: Medicare Other

## 2023-10-10 DIAGNOSIS — I442 Atrioventricular block, complete: Secondary | ICD-10-CM | POA: Diagnosis not present

## 2023-10-11 LAB — CUP PACEART REMOTE DEVICE CHECK
Battery Remaining Percentage: 80 %
Brady Statistic RA Percent Paced: 41 %
Brady Statistic RV Percent Paced: 100 %
Date Time Interrogation Session: 20250122073313
Implantable Lead Connection Status: 753985
Implantable Lead Connection Status: 753985
Implantable Lead Implant Date: 20221026
Implantable Lead Implant Date: 20221026
Implantable Lead Location: 753859
Implantable Lead Location: 753860
Implantable Lead Model: 377171
Implantable Lead Model: 377171
Implantable Lead Serial Number: 8000595070
Implantable Lead Serial Number: 8000606642
Implantable Pulse Generator Implant Date: 20221026
Lead Channel Impedance Value: 468 Ohm
Lead Channel Impedance Value: 566 Ohm
Lead Channel Pacing Threshold Amplitude: 0.8 V
Lead Channel Pacing Threshold Pulse Width: 0.4 ms
Lead Channel Sensing Intrinsic Amplitude: 13.1 mV
Lead Channel Sensing Intrinsic Amplitude: 2.3 mV
Lead Channel Setting Pacing Amplitude: 2 V
Lead Channel Setting Pacing Amplitude: 2.6 V
Lead Channel Setting Pacing Pulse Width: 0.4 ms
Pulse Gen Model: 407145
Pulse Gen Serial Number: 70223175

## 2023-10-17 NOTE — Progress Notes (Unsigned)
Cardiology Office Note:  .   Date:  10/17/2023  ID:  Denise Parrish, DOB August 20, 1923, MRN [id number] PCP: Lemar Lofty DO  [facility] Providers Cardiologist:  Charlton Haws, MD Electrophysiologist:  Lanier Prude, MD {  History of Present Illness: [name]   Denise Parrish is a 89 y.o. female w/PMHx of DM, HTN, HLD, CHB w/PPM, VHD (hx of TAVR 2022), prior stroke/TIA, PVD  (s/p right CEA in 10/2019), CAD (s/p DES to LCx in 08/2020 with medical management recommended of residual disease)   She saw dr. Lalla Brothers 08/28/22, doing well, recent issues with her GB, pacer working well Discussed observation of AHR episodes and pathophysiology of AFib. Given her age, comorbidities and frailty, the patient and her family have elected to NOT start anticoagulation   Following with Dr. Glena Norfolk, most recently 08/02/23 with APP, living independently though excellent family support. Had a fall associated with a vertigo attack. No changes were made.  Today's visit is scheduled as an annual visit, ATach episodes noted on remote  ROS:   She is accompanied by her youngest daughter She is doing well, continues to live independently with good family support No CP, palpitations or cardiac awareness No orthostatic symptoms No rest/nocturnal, or positional SOB, mild DOE is chronic and at her baseline No near syncope or syncope  Turns 89 soon!   Device information Biotronik dual chamber PPM implanted 07/13/21  Arrhythmia/AAD hx AHR episodes observed back to Dec 2023  Studies Reviewed: [name]    EKG not done today  DEVICE interrogation done today and reviewed by myself Battery and lead measurements are good All available EGMs are reviewed One old NSVT (2023) ATach labeled episodes are Afib, rate controlled (she is pacer dependent) These are rare and brief, longest 2hours AP 41% VP 100%  Cardiac Catheterization: 08/2020 1.  Severe 90% left circumflex stenosis (large caliber  vessel) with successful PCI using a 3.5 x 12 mm resolute Onyx DES 2.  Moderate to severe distal RCA stenosis of 60 to 70%, recommend medical therapy 3.  Mild nonobstructive LAD stenosis 4.  Severe aortic stenosis with mean gradient 57 mmHg and aortic valve area 0.78 cm   Recommendations: Dual antiplatelet therapy with aspirin and clopidogrel x6 months.  Continue Cangrelor infusion x2 hours then loaded with clopidogrel 600 mg x 1.  Overnight observation in this elderly patient with severe aortic stenosis post PCI.  Continue outpatient TAVR evaluation.     Echocardiogram: 11/2021 IMPRESSIONS     1. Compared to 07/12/21.   2. Left ventricular ejection fraction, by estimation, is 60 to 65%. The  left ventricle has normal function. The left ventricle has no regional  wall motion abnormalities. Left ventricular diastolic parameters are  consistent with Grade I diastolic  dysfunction (impaired relaxation). Elevated left atrial pressure.   3. Right ventricular systolic function is normal. The right ventricular  size is normal.   4. Left atrial size was mildly dilated.   5. The mitral valve is normal in structure. Mild mitral valve  regurgitation. Moderate mitral stenosis. Moderate mitral annular  calcification.   6. The aortic valve has been repaired/replaced. Aortic valve  regurgitation is not visualized. No aortic stenosis is present. There is a  23 mm CoreValve-Evolut Pro prosthetic (TAVR) valve present in the aortic  position. Procedure Date: 09/28/20. Echo  findings are consistent with normal structure and function of the aortic  valve prosthesis.   7. The inferior vena cava is normal in size with  greater than 50%  respiratory variability, suggesting right atrial pressure of 3 mmHg.    Risk Assessment/Calculations:    Physical Exam:   VS:  There were no vitals taken for this visit.   Wt Readings from Last 3 Encounters:  08/02/23 165 lb 6.4 oz (75 kg)  05/18/23 169 lb 12.1 oz (77  kg)  01/29/23 169 lb 3.2 oz (76.7 kg)    GEN: Well nourished, well developed in no acute distress NECK: No JVD; No carotid bruits CARDIAC: RRR, no murmurs, rubs, gallops RESPIRATORY:  CTA b/l without rales, wheezing or rhonchi  ABDOMEN: Soft, non-tender, non-distended EXTREMITIES:  No edema; No deformity   PPM site: is stable, no thinning, fluctuation, tethering  ASSESSMENT AND PLAN: .    PPM Intact function No programming changes made  Paroxysmal Afib CHA2DS2Vasc is 8 Prior discussions on AHR episodes, as discussed above Given her age, comorbidities and frailty, the patient and her family have perviously elected to NOT start anticoagulation Revisited today She has had a few, not frequent trip/falls Her daughter reports hx of very severe GIB in the past and not wanting to pursue [facility] Very low burden  C/w Dr. Glena Norfolk: CAD Hx of PC 2021 PVD Hx of right CEA  On plavix, BB Reportedly she was previously on Atorvastatin 40 mg daily but reports this was discontinued by her PCP in the interim given her well-controlled cholesterol and due to her age.  No symptoms   VHD Hx of TAVR, no murmur appreciated on her exam  HTN  Looks good   Dispo: remotes as usual, in clinic otherwise with EP again in a year, sooner if needed  Signed, Sheilah Pigeon, PA-C

## 2023-10-18 ENCOUNTER — Encounter: Payer: Self-pay | Admitting: Physician Assistant

## 2023-10-18 ENCOUNTER — Ambulatory Visit: Payer: 59 | Attending: Physician Assistant | Admitting: Physician Assistant

## 2023-10-18 VITALS — BP 110/46 | HR 66 | Ht 66.0 in | Wt 162.0 lb

## 2023-10-18 DIAGNOSIS — I480 Paroxysmal atrial fibrillation: Secondary | ICD-10-CM | POA: Diagnosis not present

## 2023-10-18 DIAGNOSIS — I10 Essential (primary) hypertension: Secondary | ICD-10-CM

## 2023-10-18 DIAGNOSIS — I2510 Atherosclerotic heart disease of native coronary artery without angina pectoris: Secondary | ICD-10-CM

## 2023-10-18 DIAGNOSIS — Z950 Presence of cardiac pacemaker: Secondary | ICD-10-CM | POA: Diagnosis not present

## 2023-10-18 DIAGNOSIS — I739 Peripheral vascular disease, unspecified: Secondary | ICD-10-CM | POA: Diagnosis not present

## 2023-10-18 DIAGNOSIS — Z952 Presence of prosthetic heart valve: Secondary | ICD-10-CM

## 2023-10-18 NOTE — Patient Instructions (Signed)
Medication Instructions:   Your physician recommends that you continue on your current medications as directed. Please refer to the Current Medication list given to you today.   *If you need a refill on your cardiac medications before your next appointment, please call your pharmacy*   Lab Work:  NONE ORDERED  TODAY     If you have labs (blood work) drawn today and your tests are completely normal, you will receive your results only by: MyChart Message (if you have MyChart) OR A paper copy in the mail If you have any lab test that is abnormal or we need to change your treatment, we will call you to review the results.   Testing/Procedures:  NONE ORDERED  TODAY     Follow-Up: At [facility], you and your health needs are our priority.  As part of our continuing mission to provide you with exceptional heart care, we have created designated Provider Care Teams.  These Care Teams include your primary Cardiologist (physician) and Advanced Practice Providers (APPs -  Physician Assistants and Nurse Practitioners) who all work together to provide you with the care you need, when you need it.  We recommend signing up for the patient portal called "MyChart".  Sign up information is provided on this After Visit Summary.  MyChart is used to connect with patients for Virtual Visits (Telemedicine).  Patients are able to view lab/test results, encounter notes, upcoming appointments, etc.  Non-urgent messages can be sent to your provider as well.   To learn more about what you can do with MyChart, go to [email or web address].    Your next appointment:    1 year(s)  Provider:    You may see Lanier Prude, MD or one of the following Advanced Practice Providers on your designated Care Team:   Francis Dowse, [place]  Other Instructions     1st Floor: - Lobby - Registration  - Pharmacy  - Lab - Cafe  2nd Floor: - PV Lab - Diagnostic Testing (echo, CT, nuclear med)  3rd  Floor: - Vacant  4th Floor: - TCTS (cardiothoracic surgery) - AFib Clinic - Structural Heart Clinic - Vascular Surgery  - Vascular Ultrasound  5th Floor: - [facility] (general and EP) - Clinical Pharmacy for coumadin, hypertension, lipid, weight-loss medications, and med management appointments    Valet parking services will be available as well.

## 2023-11-23 NOTE — Progress Notes (Signed)
 Remote pacemaker transmission.

## 2023-11-23 NOTE — Addendum Note (Signed)
 Addended by: Elease Etienne A on: 11/23/2023 10:28 AM   Modules accepted: Orders

## 2023-11-26 ENCOUNTER — Inpatient Hospital Stay (HOSPITAL_COMMUNITY)
Admission: EM | Admit: 2023-11-26 | Discharge: 2023-11-30 | DRG: 389 | Disposition: A | Discharge status: Home / Self Care | Attending: Internal Medicine | Admitting: Internal Medicine

## 2023-11-26 ENCOUNTER — Encounter (HOSPITAL_COMMUNITY): Payer: Self-pay | Admitting: *Deleted

## 2023-11-26 ENCOUNTER — Emergency Department (HOSPITAL_COMMUNITY)

## 2023-11-26 ENCOUNTER — Inpatient Hospital Stay (HOSPITAL_COMMUNITY)

## 2023-11-26 ENCOUNTER — Other Ambulatory Visit: Payer: Self-pay

## 2023-11-26 DIAGNOSIS — Z79899 Other long term (current) drug therapy: Secondary | ICD-10-CM

## 2023-11-26 DIAGNOSIS — Z7902 Long term (current) use of antithrombotics/antiplatelets: Secondary | ICD-10-CM | POA: Diagnosis not present

## 2023-11-26 DIAGNOSIS — K8020 Calculus of gallbladder without cholecystitis without obstruction: Secondary | ICD-10-CM | POA: Diagnosis present

## 2023-11-26 DIAGNOSIS — Z8249 Family history of ischemic heart disease and other diseases of the circulatory system: Secondary | ICD-10-CM

## 2023-11-26 DIAGNOSIS — E1165 Type 2 diabetes mellitus with hyperglycemia: Secondary | ICD-10-CM | POA: Diagnosis present

## 2023-11-26 DIAGNOSIS — E785 Hyperlipidemia, unspecified: Secondary | ICD-10-CM | POA: Diagnosis present

## 2023-11-26 DIAGNOSIS — Z953 Presence of xenogenic heart valve: Secondary | ICD-10-CM

## 2023-11-26 DIAGNOSIS — R911 Solitary pulmonary nodule: Secondary | ICD-10-CM | POA: Diagnosis present

## 2023-11-26 DIAGNOSIS — R112 Nausea with vomiting, unspecified: Secondary | ICD-10-CM | POA: Diagnosis present

## 2023-11-26 DIAGNOSIS — Z7189 Other specified counseling: Secondary | ICD-10-CM | POA: Diagnosis not present

## 2023-11-26 DIAGNOSIS — Z8673 Personal history of transient ischemic attack (TIA), and cerebral infarction without residual deficits: Secondary | ICD-10-CM | POA: Diagnosis not present

## 2023-11-26 DIAGNOSIS — K209 Esophagitis, unspecified without bleeding: Secondary | ICD-10-CM | POA: Insufficient documentation

## 2023-11-26 DIAGNOSIS — F419 Anxiety disorder, unspecified: Secondary | ICD-10-CM | POA: Diagnosis present

## 2023-11-26 DIAGNOSIS — I2510 Atherosclerotic heart disease of native coronary artery without angina pectoris: Secondary | ICD-10-CM | POA: Diagnosis present

## 2023-11-26 DIAGNOSIS — Z955 Presence of coronary angioplasty implant and graft: Secondary | ICD-10-CM | POA: Diagnosis not present

## 2023-11-26 DIAGNOSIS — K56699 Other intestinal obstruction unspecified as to partial versus complete obstruction: Principal | ICD-10-CM | POA: Diagnosis present

## 2023-11-26 DIAGNOSIS — R935 Abnormal findings on diagnostic imaging of other abdominal regions, including retroperitoneum: Secondary | ICD-10-CM | POA: Diagnosis not present

## 2023-11-26 DIAGNOSIS — R933 Abnormal findings on diagnostic imaging of other parts of digestive tract: Secondary | ICD-10-CM | POA: Diagnosis not present

## 2023-11-26 DIAGNOSIS — Z8049 Family history of malignant neoplasm of other genital organs: Secondary | ICD-10-CM

## 2023-11-26 DIAGNOSIS — I10 Essential (primary) hypertension: Secondary | ICD-10-CM | POA: Diagnosis present

## 2023-11-26 DIAGNOSIS — K56609 Unspecified intestinal obstruction, unspecified as to partial versus complete obstruction: Secondary | ICD-10-CM | POA: Diagnosis present

## 2023-11-26 DIAGNOSIS — I442 Atrioventricular block, complete: Secondary | ICD-10-CM | POA: Diagnosis present

## 2023-11-26 DIAGNOSIS — K2100 Gastro-esophageal reflux disease with esophagitis, without bleeding: Secondary | ICD-10-CM | POA: Diagnosis present

## 2023-11-26 DIAGNOSIS — K219 Gastro-esophageal reflux disease without esophagitis: Secondary | ICD-10-CM | POA: Diagnosis not present

## 2023-11-26 DIAGNOSIS — M810 Age-related osteoporosis without current pathological fracture: Secondary | ICD-10-CM | POA: Diagnosis present

## 2023-11-26 DIAGNOSIS — Z7984 Long term (current) use of oral hypoglycemic drugs: Secondary | ICD-10-CM

## 2023-11-26 DIAGNOSIS — I350 Nonrheumatic aortic (valve) stenosis: Secondary | ICD-10-CM | POA: Diagnosis present

## 2023-11-26 DIAGNOSIS — Z1152 Encounter for screening for COVID-19: Secondary | ICD-10-CM | POA: Diagnosis not present

## 2023-11-26 DIAGNOSIS — R9431 Abnormal electrocardiogram [ECG] [EKG]: Secondary | ICD-10-CM | POA: Insufficient documentation

## 2023-11-26 DIAGNOSIS — Z96641 Presence of right artificial hip joint: Secondary | ICD-10-CM | POA: Diagnosis present

## 2023-11-26 DIAGNOSIS — Z950 Presence of cardiac pacemaker: Secondary | ICD-10-CM | POA: Diagnosis not present

## 2023-11-26 DIAGNOSIS — Z833 Family history of diabetes mellitus: Secondary | ICD-10-CM | POA: Diagnosis not present

## 2023-11-26 DIAGNOSIS — Z515 Encounter for palliative care: Secondary | ICD-10-CM | POA: Diagnosis not present

## 2023-11-26 DIAGNOSIS — J439 Emphysema, unspecified: Secondary | ICD-10-CM | POA: Diagnosis present

## 2023-11-26 LAB — CBC WITH DIFFERENTIAL/PLATELET
Abs Immature Granulocytes: 0.04 10*3/uL (ref 0.00–0.07)
Basophils Absolute: 0 10*3/uL (ref 0.0–0.1)
Basophils Relative: 0 %
Eosinophils Absolute: 0 10*3/uL (ref 0.0–0.5)
Eosinophils Relative: 0 %
HCT: 40.6 % (ref 36.0–46.0)
Hemoglobin: 13.8 g/dL (ref 12.0–15.0)
Immature Granulocytes: 0 %
Lymphocytes Relative: 11 %
Lymphs Abs: 1.2 10*3/uL (ref 0.7–4.0)
MCH: 31.4 pg (ref 26.0–34.0)
MCHC: 34 g/dL (ref 30.0–36.0)
MCV: 92.3 fL (ref 80.0–100.0)
Monocytes Absolute: 0.7 10*3/uL (ref 0.1–1.0)
Monocytes Relative: 7 %
Neutro Abs: 8.4 10*3/uL — ABNORMAL HIGH (ref 1.7–7.7)
Neutrophils Relative %: 82 %
Platelets: 223 10*3/uL (ref 150–400)
RBC: 4.4 MIL/uL (ref 3.87–5.11)
RDW: 12.9 % (ref 11.5–15.5)
WBC: 10.3 10*3/uL (ref 4.0–10.5)
nRBC: 0 % (ref 0.0–0.2)

## 2023-11-26 LAB — URINALYSIS, W/ REFLEX TO CULTURE (INFECTION SUSPECTED)
Bilirubin Urine: NEGATIVE
Glucose, UA: NEGATIVE mg/dL
Hgb urine dipstick: NEGATIVE
Ketones, ur: 5 mg/dL — AB
Leukocytes,Ua: NEGATIVE
Nitrite: NEGATIVE
Protein, ur: 30 mg/dL — AB
Specific Gravity, Urine: 1.017 (ref 1.005–1.030)
pH: 9 — ABNORMAL HIGH (ref 5.0–8.0)

## 2023-11-26 LAB — COMPREHENSIVE METABOLIC PANEL
ALT: 26 U/L (ref 0–44)
AST: 33 U/L (ref 15–41)
Albumin: 3.7 g/dL (ref 3.5–5.0)
Alkaline Phosphatase: 68 U/L (ref 38–126)
Anion gap: 13 (ref 5–15)
BUN: 21 mg/dL (ref 8–23)
CO2: 25 mmol/L (ref 22–32)
Calcium: 9.7 mg/dL (ref 8.9–10.3)
Chloride: 100 mmol/L (ref 98–111)
Creatinine, Ser: 0.98 mg/dL (ref 0.44–1.00)
GFR, Estimated: 52 mL/min — ABNORMAL LOW (ref >60)
Glucose, Bld: 157 mg/dL — ABNORMAL HIGH (ref 70–99)
Potassium: 3.9 mmol/L (ref 3.5–5.1)
Sodium: 138 mmol/L (ref 135–145)
Total Bilirubin: 1.1 mg/dL (ref 0.0–1.2)
Total Protein: 6.9 g/dL (ref 6.5–8.1)

## 2023-11-26 LAB — LIPASE, BLOOD: Lipase: 31 U/L (ref 11–51)

## 2023-11-26 MED ORDER — PROMETHAZINE HCL 25 MG/ML IJ SOLN
INTRAMUSCULAR | Status: AC
Start: 2023-11-26 — End: ?
  Filled 2023-11-26: qty 1

## 2023-11-26 MED ORDER — ONDANSETRON HCL 4 MG/2ML IJ SOLN
4.0000 mg | Freq: Once | INTRAMUSCULAR | Status: AC
  Administered 2023-11-26: 4 mg via INTRAVENOUS
  Filled 2023-11-26: qty 2

## 2023-11-26 MED ORDER — METOCLOPRAMIDE HCL 5 MG/ML IJ SOLN
5.0000 mg | Freq: Once | INTRAMUSCULAR | Status: AC
  Administered 2023-11-26: 5 mg via INTRAVENOUS
  Filled 2023-11-26: qty 2

## 2023-11-26 MED ORDER — PROMETHAZINE (PHENERGAN) 6.25MG IN NS 50ML IVPB
6.2500 mg | Freq: Once | INTRAVENOUS | Status: AC
  Administered 2023-11-26: 6.25 mg via INTRAVENOUS
  Filled 2023-11-26: qty 50

## 2023-11-26 MED ORDER — SODIUM CHLORIDE 0.9 % IV BOLUS
500.0000 mL | Freq: Once | INTRAVENOUS | Status: AC
  Administered 2023-11-26: 500 mL via INTRAVENOUS

## 2023-11-26 MED ORDER — SODIUM CHLORIDE 0.9 % IV SOLN: INTRAVENOUS | Status: DC

## 2023-11-26 MED ORDER — IOHEXOL 300 MG/ML  SOLN
100.0000 mL | Freq: Once | INTRAMUSCULAR | Status: AC | PRN
  Administered 2023-11-26: 100 mL via INTRAVENOUS

## 2023-11-26 NOTE — Progress Notes (Signed)
 Patient arrived to room 314 from ED.  Assessment complete, VS obtained, and Admission database began.

## 2023-11-26 NOTE — ED Notes (Signed)
 Brief changed, peri care performed, pt repositioned in bed and given a box of tissues

## 2023-11-26 NOTE — ED Notes (Signed)
 .ED TO INPATIENT HANDOFF REPORT  ED Nurse Name and Phone #: [phone]  S Name/Age/Gender Denise Parrish 89 y.o. female Room/Bed: APA04/APA04  Code Status   Code Status: Prior  Home/SNF/Other Home Patient oriented to: self, place, time, and situation Is this baseline? Yes   Triage Complete: Triage complete  Chief Complaint Small bowel obstruction (HCC) [K56.609]  Triage Note Pt c/o nausea and vomiting that started last night; pt has been diagnosed with gallstones and had a drain for 15 months and had it removed x 3 months ago     Allergies No Known Allergies  Level of Care/Admitting Diagnosis ED Disposition     ED Disposition  Admit   Condition  --   Comment  Hospital Area: [facility] [[id number]]  Level of Care: Med-Surg [16]  Covid Evaluation: Asymptomatic - no recent exposure (last 10 days) testing not required  Diagnosis: Small bowel obstruction [facility]) [[id number]]  Admitting Physician: Frankey Shown [[id number]]  Attending Physician: Frankey Shown [[id number]]  Certification:: I certify this patient will need inpatient services for at least 2 midnights  Expected Medical Readiness: 11/29/2023          B Medical/Surgery History Past Medical History:  Diagnosis Date   Anxiety    CAD (coronary artery disease)    a. 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 Merlyn Lot diverticulosis, negative microscopic  colitis    COLONOSCOPY WITH PROPOFOL N/A 11/27/2020   Surgeon: Dolores Frame, MD;  diverticulosis, no active bleeding.  Two 2-4 mm polyps not removed in the setting of Plavix.   CORONARY STENT INTERVENTION N/A 09/08/2020   Procedure: CORONARY STENT INTERVENTION;  Surgeon: Tonny Bollman, MD;  Location: [facility] INVASIVE CV LAB;  Service: Cardiovascular;  Laterality: N/A;   ENDARTERECTOMY Right 10/21/2019   Procedure: ENDARTERECTOMY CAROTID RIGHT;  Surgeon: Maeola Harman, MD;  Location: [facility] OR;  Service: Vascular;  Laterality: Right;   ESOPHAGOGASTRODUODENOSCOPY  04/03/2012   Dr. Dot Been hernia, chronic gastritis on bx   ESOPHAGOGASTRODUODENOSCOPY (EGD) WITH PROPOFOL N/A 04/28/2020   Procedure: ESOPHAGOGASTRODUODENOSCOPY (EGD) WITH PROPOFOL;  Surgeon: Kathi Der, MD;  Location: [facility] ENDOSCOPY;  Service: Gastroenterology;  Laterality: N/A;  ESOPHAGOGASTRODUODENOSCOPY (EGD) WITH PROPOFOL N/A 11/27/2020   Surgeon: Marguerita Merles, Reuel Boom, MD; findings suggestive of short segment Barrett's, normal stomach, and small bowel.   FEMORAL ARTERY EXPLORATION Right 09/28/2020   Procedure: RIGHT GROIN EXPLORATION WITH REPAIR RIGHT COMMON FEMORAL ARTERY;  Surgeon: Larina Earthly, MD;  Location: [facility] OR;  Service: Vascular;  Laterality: Right;   GIVENS CAPSULE STUDY  05/09/2012   RMR: an unclear raised area of small bowel was noted, with  features almost  characteristic of very small polyp. This was without villous  blunting or any evidence of active bleeding; yet, the area of  concern appeared to be erythematous. However, this could simply  be a light reflection on a normal variation of the small bowel. REFERRED TO DR. GILLIAM, APPT FOR NOVEMBER 2013.    GIVENS CAPSULE STUDY N/A 04/30/2020   Procedure: GIVENS CAPSULE STUDY;  Surgeon: Kathi Der, MD;  Location: [facility] ENDOSCOPY;  Service: Gastroenterology;  Laterality: N/A;   GIVENS CAPSULE STUDY  11/27/2020    Surgeon: Marguerita Merles, Reuel Boom, MD;  questionable nodule/small mass in the distal small bowel but without ulceration.  Evidence of hematochezia throughout the colon but no fresh blood.    HIP ARTHROPLASTY Right    INSERT / REPLACE / REMOVE PACEMAKER     IR CHOLANGIOGRAM EXISTING TUBE  07/13/2022   IR REMOVAL BILIARY DRAIN  08/09/2022   PACEMAKER IMPLANT N/A 07/12/2021   Procedure: PACEMAKER IMPLANT;  Surgeon: Lanier Prude, MD;  Location: [facility] INVASIVE CV LAB;  Service: Cardiovascular;  Laterality: N/A;   PATCH ANGIOPLASTY Right 10/21/2019   Procedure: PATCH ANGIOPLASTY USING Livia Snellen BIOLOGIC PATCH;  Surgeon: Maeola Harman, MD;  Location: [facility] OR;  Service: Vascular;  Laterality: Right;   POLYPECTOMY  04/28/2020   Procedure: POLYPECTOMY;  Surgeon: Kathi Der, MD;  Location: [facility] ENDOSCOPY;  Service: Gastroenterology;;   RIGHT/LEFT HEART CATH AND CORONARY ANGIOGRAPHY N/A 09/08/2020   Procedure: RIGHT/LEFT HEART CATH AND CORONARY ANGIOGRAPHY;  Surgeon: Tonny Bollman, MD;  Location: [facility] INVASIVE CV LAB;  Service: Cardiovascular;  Laterality: N/A;   TEE WITHOUT CARDIOVERSION N/A 09/28/2020   Procedure: TRANSESOPHAGEAL ECHOCARDIOGRAM (TEE);  Surgeon: Tonny Bollman, MD;  Location: [facility] OR;  Service: Open Heart Surgery;  Laterality: N/A;   TONSILLECTOMY     TOTAL HIP ARTHROPLASTY  1995   Right   TRANSCATHETER AORTIC VALVE REPLACEMENT, TRANSFEMORAL N/A  09/28/2020   Procedure: TRANSCATHETER AORTIC VALVE REPLACEMENT, TRANSFEMORAL;  Surgeon: Tonny Bollman, MD;  Location: [facility] OR;  Service: Open Heart Surgery;  Laterality: N/A;     A IV Location/Drains/Wounds Patient Lines/Drains/Airways Status     Active Line/Drains/Airways     Name Placement date Placement time Site Days   Peripheral IV 11/26/23 20 G 1" Right;Anterior Forearm 11/26/23  1229  Forearm  less than 1   NG/OG Vented/Dual Lumen 16 Fr. Right nare External length of tube 62 cm 11/26/23  2146  Right nare  less than 1            Intake/Output Last 24 hours No intake or output data in the 24 hours ending 11/26/23 2228  Labs/Imaging Results for orders placed or performed during the hospital encounter of 11/26/23 (from the past 48 hours)  Comprehensive metabolic panel     Status: Abnormal   Collection Time: 11/26/23 12:30 PM  Result Value Ref Range   Sodium 138 135 - 145 mmol/L   Potassium 3.9 3.5 - 5.1 mmol/L   Chloride 100 98 - 111 mmol/L   CO2 25 22 -  32 mmol/L   Glucose, Bld 157 (H) 70 - 99 mg/dL    Comment: Glucose reference range applies only to samples taken after fasting for at least 8 hours.   BUN 21 8 - 23 mg/dL   Creatinine, Ser 4.09 0.44 - 1.00 mg/dL   Calcium 9.7 8.9 - 81.1 mg/dL   Total Protein 6.9 6.5 - 8.1 g/dL   Albumin 3.7 3.5 - 5.0 g/dL   AST 33 15 - 41 U/L   ALT 26 0 - 44 U/L   Alkaline Phosphatase 68 38 - 126 U/L   Total Bilirubin 1.1 0.0 - 1.2 mg/dL   GFR, Estimated 52 (L) >60 mL/min    Comment: (NOTE) Calculated using the CKD-EPI Creatinine Equation (2021)    Anion gap 13 5 - 15    Comment: Performed at [facility], [address] [place], [place] [address]  CBC with Differential     Status: Abnormal   Collection Time: 11/26/23 12:30 PM  Result Value Ref Range   WBC 10.3 4.0 - 10.5 K/uL   RBC 4.40 3.87 - 5.11 MIL/uL   Hemoglobin 13.8 12.0 - 15.0 g/dL   HCT 29.5 62.1 - 30.8 %   MCV 92.3 80.0 - 100.0 fL   MCH 31.4 26.0 - 34.0 pg    MCHC 34.0 30.0 - 36.0 g/dL   RDW 65.7 84.6 - 96.2 %   Platelets 223 150 - 400 K/uL   nRBC 0.0 0.0 - 0.2 %   Neutrophils Relative % 82 %   Neutro Abs 8.4 (H) 1.7 - 7.7 K/uL   Lymphocytes Relative 11 %   Lymphs Abs 1.2 0.7 - 4.0 K/uL   Monocytes Relative 7 %   Monocytes Absolute 0.7 0.1 - 1.0 K/uL   Eosinophils Relative 0 %   Eosinophils Absolute 0.0 0.0 - 0.5 K/uL   Basophils Relative 0 %   Basophils Absolute 0.0 0.0 - 0.1 K/uL   Immature Granulocytes 0 %   Abs Immature Granulocytes 0.04 0.00 - 0.07 K/uL    Comment: Performed at [facility], [address] [place], [place] [address]  Lipase, blood     Status: None   Collection Time: 11/26/23 12:30 PM  Result Value Ref Range   Lipase 31 11 - 51 U/L    Comment: Performed at [facility], [address], [place], [place] [address]  Urinalysis, w/ Reflex to Culture (Infection Suspected) -Urine, Clean Catch     Status: Abnormal   Collection Time: 11/26/23  5:42 PM  Result Value Ref Range   Specimen Source URINE, CLEAN CATCH    Color, Urine YELLOW YELLOW   APPearance CLOUDY (A) CLEAR   Specific Gravity, Urine 1.017 1.005 - 1.030   pH 9.0 (H) 5.0 - 8.0   Glucose, UA NEGATIVE NEGATIVE mg/dL   Hgb urine dipstick NEGATIVE NEGATIVE   Bilirubin Urine NEGATIVE NEGATIVE   Ketones, ur 5 (A) NEGATIVE mg/dL   Protein, ur 30 (A) NEGATIVE mg/dL   Nitrite NEGATIVE NEGATIVE   Leukocytes,Ua NEGATIVE NEGATIVE   RBC / HPF 0-5 0 - 5 RBC/hpf   WBC, UA 0-5 0 - 5 WBC/hpf    Comment:        Reflex urine culture not performed if WBC <=10, OR if Squamous epithelial cells >5. If Squamous epithelial cells >5 suggest recollection.    Bacteria, UA RARE (A) NONE SEEN   Squamous Epithelial / HPF 0-5 0 - 5 /HPF   Amorphous Crystal PRESENT     Comment:  Performed at [facility], [address], [place], [place] [address]   CT ABDOMEN PELVIS W CONTRAST Result Date: 11/26/2023 CLINICAL DATA:  Nausea and vomiting. EXAM: CT ABDOMEN AND PELVIS WITH CONTRAST  TECHNIQUE: Multidetector CT imaging of the abdomen and pelvis was performed using the standard protocol following bolus administration of intravenous contrast. RADIATION DOSE REDUCTION: This exam was performed according to the departmental dose-optimization program which includes automated exposure control, adjustment of the mA and/or kV according to patient size and/or use of iterative reconstruction technique. CONTRAST:  OMNIPAQUE IOHEXOL 300 MG/ML  SOLN COMPARISON:  CT abdomen and pelvis 05/18/2023 FINDINGS: Lower chest: Focal ground-glass nodular density in the left lower lobe measures 10 x 7 mm. There is atelectasis in the right lung base. Hepatobiliary: A large gallstone is present measuring 4.2 cm. There is no biliary ductal dilatation. The liver is within normal limits. Pancreas: Unremarkable. No pancreatic ductal dilatation or surrounding inflammatory changes. Spleen: Normal in size without focal abnormality. Adrenals/Urinary Tract: Mild left renal atrophy present. No hydronephrosis or perinephric fluid. There are bilateral renal cysts measuring up to 2 cm. Adrenal glands and bladder are within normal limits. Stomach/Bowel: No free air. There is a short segment of small bowel wall thickening in the right lower quadrant. This is transition point for small bowel obstruction. Proximal small bowel is mildly dilated with air-fluid levels. The stomach is mildly dilated with air-fluid level. There is wall thickening of the distal esophagus. The appendix is within normal limits. There is sigmoid and descending colon diverticulosis. Vascular/Lymphatic: Aortic atherosclerosis. No enlarged abdominal or pelvic lymph nodes. Reproductive: Uterus and bilateral adnexa are unremarkable. Other: No abdominal wall hernia or abnormality. No abdominopelvic ascites. Musculoskeletal: Multilevel degenerative changes affect the spine. Right hip arthroplasty is present. There are degenerative changes of the left hip.  IMPRESSION: 1. Small-bowel obstruction with transition point in the right lower quadrant where there is a short segment of small bowel wall thickening. Findings may be related to infectious/inflammatory enteritis, however neoplasm is not excluded. 2. Cholelithiasis. 3. Colonic diverticulosis. 4. Wall thickening of the distal esophagus worrisome for esophagitis. 5. 10 x 7 mm ground-glass nodular density in the left lower lobe. Initial follow-up with CT at 6 months is recommended to confirm persistence. If persistent, repeat CT is recommended every 2 years until 5 years of stability has been established. This recommendation follows the consensus statement: Guidelines for Management of Incidental Pulmonary Nodules Detected on CT Images: From the Fleischner Society 2017; Radiology 2017; 284:228-243. 6. Bosniak I benign renal cyst measuring 2 cm. No follow-up imaging is recommended. JACR 2018 Feb; 264-273, Management of the Incidental Renal Mass on CT, Radio[employer] 2021; 814-848, Bosniak Classification of Cystic Renal Masses, Version 2019. Aortic Atherosclerosis (ICD10-I70.0). Electronically Signed   By: Darliss Cheney M.D.   On: 11/26/2023 20:40   US Abdomen Limited RUQ (LIVER/GB) Result Date: 11/26/2023 CLINICAL DATA:  Severe nausea and vomiting since last night. EXAM: ULTRASOUND ABDOMEN LIMITED RIGHT UPPER QUADRANT COMPARISON:  CT abdomen pelvis 05/18/2023. FINDINGS: Gallbladder: Shadowing echogenic stones measure up to 1.4 cm and do not appear mobile. No wall thickening. Negative sonographic Murphy sign. Common bile duct: Diameter: 6 mm, within normal limits for age. No intrahepatic biliary ductal dilatation. Liver: No focal lesion identified. Within normal limits in parenchymal echogenicity. Portal vein is patent on color Doppler imaging with normal direction of blood flow towards the liver. Other: Incidental imaging of the right kidney shows fullness of the right renal pelvis which likely corresponds to  a  prominent extrarenal pelvis as on 05/18/2023. IMPRESSION: Cholelithiasis.  No acute findings. Electronically Signed   By: Leanna Battles M.D.   On: 11/26/2023 16:10    Pending Labs Unresulted Labs (From admission, onward)     Start     Ordered   11/26/23 1948  Resp panel by RT-PCR (RSV, Flu A&B, Covid) Anterior Nasal Swab  Once,   URGENT        11/26/23 1948            Vitals/Pain Today's Vitals   11/26/23 1743 11/26/23 1745 11/26/23 1948 11/26/23 2047  BP:    (!) 132/48  Pulse:  85  61  Resp:  18  19  Temp: 98.7 F (37.1 C)   98.1 F (36.7 C)  TempSrc: Oral   Oral  SpO2:  100% (!) 70% 98%  Weight:      Height:      PainSc:    0-No pain    Isolation Precautions No active isolations  Medications Medications  0.9 %  sodium chloride infusion ( Intravenous New Bag/Given 11/26/23 2125)  sodium chloride 0.9 % bolus 500 mL (0 mLs Intravenous Stopped 11/26/23 1323)  ondansetron (ZOFRAN) injection 4 mg (4 mg Intravenous Given 11/26/23 1233)  metoCLOPramide (REGLAN) injection 5 mg (5 mg Intravenous Given 11/26/23 1629)  iohexol (OMNIPAQUE) 300 MG/ML solution 100 mL (100 mLs Intravenous Contrast Given 11/26/23 1730)  ondansetron (ZOFRAN) injection 4 mg (4 mg Intravenous Given 11/26/23 1830)  promethazine (PHENERGAN) 6.25 mg/NS 50 mL IVPB (0 mg Intravenous Stopped 11/26/23 2107)    Mobility walks     [employer], nontender abdomen with persistant nausea and vomiting.NG tube placed per order.  Patient denies pain or discomfort except with vomiting. No vomiting noted since NG tube placement.   R Recommendations: See Admitting Provider Note  Report given to:   Additional Notes: Patient denies pain or discomfort except with vomiting. No vomiting noted since NG tube placement. Patient uses hearing aids but states one of the batteries is dead, talk loud and clear and she communicates well. 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

## 2023-11-26 NOTE — H&P (Signed)
 History and Physical    Patient: Denise Parrish ZOX:[id number] DOB: 1923-06-22 DOA: 11/26/2023 DOS: the patient was seen and examined on 11/27/2023 PCP: Lemar Lofty, DO  Patient coming from: Home  Chief Complaint:  Chief Complaint  Patient presents with   Nausea   HPI: Denise Parrish is a 89 y.o. female with medical history significant of hypertension, hyperlipidemia, GERD, CAD s/p stent placement, complete AV block status post pacemaker, severe aortic stenosis s/pTAVR who presents to the emergency department due to nausea and vomiting which started last night.  Patient thought this was due to gallstones, since she had a percutaneous drainage for 15 months when she was diagnosed with cholecystitis about 2 years ago.  This was removed about 3 months ago per ED medical record.  She denies chest pain, fever, chills, shortness of breath.  EMS was activated and she was sent to the ED for further evaluation and management.  ED Course:  In the emergency department, she was hemodynamically stable.  Workup in the ED showed normal CBC, BMP except for blood glucose of 157.  Urinalysis was normal.  Influenza A, B, SARS coronavirus 2, RSV was negative. CT abdomen and pelvis with contrast showed small bowel obstruction with transition point in the right lower quadrant where there is a short segment of small bowel wall thickening. Findings may be related to infectious/inflammatory enteritis, however neoplasm is not excluded. Wall thickening of the distal esophagus worrisome for esophagitis. Patient was treated with Zofran and Phenergan.  IV hydration was provided, Reglan was also given.  NGT was placed. General surgery (Dr. Robyne Peers) was consulted and will follow-up with patient in the morning. Hospitalist was asked to admit patient for further evaluation and management.  Review of Systems: Review of systems as noted in the HPI. All other systems reviewed and are negative.   Past Medical  History:  Diagnosis Date   Anxiety    CAD (coronary artery disease)    a. DES placement to the LCx in 08/2020 and medical management was recommended of her residual disease   Carotid stenosis, right 10/16/2019   75% on CTA neck   Cholelithiasis    on u/s 09/2011   Degenerative joint disease    Right THA in 1995; cervical discectomy and fusion-2001   Diabetes mellitus, type 2 (HCC)    + neuropathy   Emphysema    Gastroesophageal reflux disease    Hiatal hernia    History of CVA (cerebrovascular accident)    Hyperlipidemia    Hypertension    IDA (iron deficiency anemia)    labs 09/2011   Mild cognitive impairment    Obesity    Osteoporosis    Pneumonia    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   s/p TAVR with a 23mm Medtronic Evolut Pro+ via the TF approach    Severe aortic stenosis    Sigmoid diverticulosis    Small bowel lesion    On Given's capsule; Dr Sharin Mons 07/2012, no further w/u needed   Past Surgical History:  Procedure Laterality Date   ANTERIOR CERVICAL DISCECTOMY  2001   C4-5, allograft, fixation   BREAST LUMPECTOMY     benign   CATARACT EXTRACTION W/ INTRAOCULAR LENS IMPLANT  2007   Left   COLONOSCOPY  2006   COLONOSCOPY  04/03/2012   Dr. Merlyn Lot diverticulosis, negative microscopic  colitis    COLONOSCOPY WITH PROPOFOL N/A 11/27/2020   Surgeon: Dolores Frame, MD;  diverticulosis, no active bleeding.  Two 2-4 mm polyps not removed in the setting of Plavix.   CORONARY STENT INTERVENTION N/A 09/08/2020   Procedure: CORONARY STENT INTERVENTION;  Surgeon: Tonny Bollman, MD;  Location: [facility] INVASIVE CV LAB;  Service: Cardiovascular;  Laterality: N/A;   ENDARTERECTOMY Right 10/21/2019   Procedure: ENDARTERECTOMY CAROTID RIGHT;  Surgeon: Maeola Harman, MD;  Location: [facility] OR;  Service: Vascular;  Laterality: Right;   ESOPHAGOGASTRODUODENOSCOPY  04/03/2012   Dr. Dot Been hernia, chronic gastritis on bx    ESOPHAGOGASTRODUODENOSCOPY (EGD) WITH PROPOFOL N/A 04/28/2020   Procedure: ESOPHAGOGASTRODUODENOSCOPY (EGD) WITH PROPOFOL;  Surgeon: Kathi Der, MD;  Location: [facility] ENDOSCOPY;  Service: Gastroenterology;  Laterality: N/A;   ESOPHAGOGASTRODUODENOSCOPY (EGD) WITH PROPOFOL N/A 11/27/2020   Surgeon: Marguerita Merles, Reuel Boom, MD; findings suggestive of short segment Barrett's, normal stomach, and small bowel.   FEMORAL ARTERY EXPLORATION Right 09/28/2020   Procedure: RIGHT GROIN EXPLORATION WITH REPAIR RIGHT COMMON FEMORAL ARTERY;  Surgeon: Larina Earthly, MD;  Location: [facility] OR;  Service: Vascular;  Laterality: Right;   GIVENS CAPSULE STUDY  05/09/2012   RMR: an unclear raised area of small bowel was noted, with features almost  characteristic of very small polyp. This was without villous  blunting or any evidence of active bleeding; yet, the area of  concern appeared to be erythematous. However, this could simply  be a light reflection on a normal variation of the small bowel. REFERRED TO DR. GILLIAM, APPT FOR NOVEMBER 2013.    GIVENS CAPSULE STUDY N/A 04/30/2020   Procedure: GIVENS CAPSULE STUDY;  Surgeon: Kathi Der, MD;  Location: [facility] ENDOSCOPY;  Service: Gastroenterology;  Laterality: N/A;   GIVENS CAPSULE STUDY  11/27/2020    Surgeon: Marguerita Merles, Reuel Boom, MD;  questionable nodule/small mass in the distal small bowel but without ulceration.  Evidence of hematochezia throughout the colon but no fresh blood.    HIP ARTHROPLASTY Right    INSERT / REPLACE / REMOVE PACEMAKER     IR CHOLANGIOGRAM EXISTING TUBE  07/13/2022   IR REMOVAL BILIARY DRAIN  08/09/2022   PACEMAKER IMPLANT N/A 07/12/2021   Procedure: PACEMAKER IMPLANT;  Surgeon: Lanier Prude, MD;  Location: [facility] INVASIVE CV LAB;  Service: Cardiovascular;  Laterality: N/A;   PATCH ANGIOPLASTY Right 10/21/2019   Procedure: PATCH ANGIOPLASTY USING Livia Snellen BIOLOGIC PATCH;  Surgeon: Maeola Harman, MD;  Location: [facility] OR;   Service: Vascular;  Laterality: Right;   POLYPECTOMY  04/28/2020   Procedure: POLYPECTOMY;  Surgeon: Kathi Der, MD;  Location: [facility] ENDOSCOPY;  Service: Gastroenterology;;   RIGHT/LEFT HEART CATH AND CORONARY ANGIOGRAPHY N/A 09/08/2020   Procedure: RIGHT/LEFT HEART CATH AND CORONARY ANGIOGRAPHY;  Surgeon: Tonny Bollman, MD;  Location: [facility] INVASIVE CV LAB;  Service: Cardiovascular;  Laterality: N/A;   TEE WITHOUT CARDIOVERSION N/A 09/28/2020   Procedure: TRANSESOPHAGEAL ECHOCARDIOGRAM (TEE);  Surgeon: Tonny Bollman, MD;  Location: [facility] OR;  Service: Open Heart Surgery;  Laterality: N/A;   TONSILLECTOMY     TOTAL HIP ARTHROPLASTY  1995   Right   TRANSCATHETER AORTIC VALVE REPLACEMENT, TRANSFEMORAL N/A 09/28/2020   Procedure: TRANSCATHETER AORTIC VALVE REPLACEMENT, TRANSFEMORAL;  Surgeon: Tonny Bollman, MD;  Location: [facility] OR;  Service: Open Heart Surgery;  Laterality: N/A;    Social History:  reports that she has never smoked. She has never been exposed to tobacco smoke. She has never used smokeless tobacco. She reports that she does not drink alcohol and does not use drugs.   No Known Allergies  Family History  Problem Relation Age of Onset  Cirrhosis Mother        no etoh use   CAD Father    Diabetes Mother        + brother   Heart failure Father    Hypertension Father        + brother   Cancer Brother        lung, age 80   Uterine cancer Sister    Colon cancer Neg Hx      Prior to Admission medications   Medication Sig Start Date End Date Taking? Authorizing Provider  acetaminophen (TYLENOL) 500 MG tablet Take 500 mg by mouth every 8 (eight) hours as needed for mild pain.   Yes [provider]  ALPRAZolam Prudy Feeler) 0.5 MG tablet Take 0.5 mg by mouth at bedtime.   Yes [provider]  Cholecalciferol (VITAMIN D3 PO) Take 1 tablet by mouth daily.   Yes [provider]  clopidogrel (PLAVIX) 75 MG tablet TAKE ONE (1) TABLET BY MOUTH EVERY DAY  WITH BREAKFAST 08/22/23  Yes Lanier Prude, MD  FEROSUL 325 (65 Fe) MG tablet TAKE ONE TABLET (325MG  TOTAL) BY MOUTH DAILY 04/10/23  Yes Clearance Coots, Kristen S, PA-C  glimepiride (AMARYL) 1 MG tablet Take 1 mg by mouth daily with breakfast.   Yes [provider]  meclizine (ANTIVERT) 12.5 MG tablet Take 1 tablet (12.5 mg total) by mouth 3 (three) times daily as needed for dizziness. 12/14/19  Yes Raeford Razor, MD  metoprolol tartrate (LOPRESSOR) 25 MG tablet Take 0.5 tablets (12.5 mg total) by mouth 2 (two) times daily. 05/07/20  Yes Mikhail, Nita Sells, DO  Multiple Vitamin (MULTIVITAMIN) tablet Take 1 tablet by mouth daily.   Yes [provider]  Multiple Vitamins-Minerals (PRESERVISION AREDS 2 PO) Take 1 tablet by mouth daily. Takes 1 tablet in the morning and 1 tablet at night for eyes.   Yes [provider]  pantoprazole (PROTONIX) 40 MG tablet TAKE ONE TABLET (40 MG TOTAL) BY MOUTH ONCE DAILY. 08/22/23  Yes Lanier Prude, MD  vitamin C (ASCORBIC ACID) 500 MG tablet Take 500 mg by mouth daily.   Yes [provider]  zinc sulfate, 50mg  elemental zinc, 220 (50 Zn) MG capsule Take 220 mg by mouth daily.   Yes [provider]    Physical Exam: BP (!) 147/79 (BP Location: Left Arm)   Pulse 89   Temp 98.3 F (36.8 C) (Oral)   Resp 18   Ht 5\' 6"  (1.676 m)   Wt 73.5 kg   SpO2 99%   BMI 26.15 kg/m   General: 89 y.o. year-old female well developed well nourished in no acute distress.  Alert and oriented x3. HEENT: NCAT, EOMI Neck: Supple, trachea medial Cardiovascular: Pacemaker site was noted.  Regular rate and rhythm with no rubs or gallops.  No thyromegaly or JVD noted.  No lower extremity edema. 2/4 pulses in all 4 extremities. Respiratory: Clear to auscultation with no wheezes or rales. Good inspiratory effort. Abdomen: Patient was noted with an NG tube.  Soft, nontender nondistended with normal bowel sounds x4 quadrants. Muskuloskeletal: No  cyanosis, clubbing or edema noted bilaterally Neuro: CN II-XII intact, strength 5/5 x 4, sensation, reflexes intact Skin: No ulcerative lesions noted or rashes Psychiatry: Judgement and insight appear normal. Mood is appropriate for condition and setting          Labs on Admission:  Basic Metabolic Panel: Recent Labs  Lab 11/26/23 1230  NA 138  K 3.9  CL 100  CO2 25  GLUCOSE 157*  BUN 21  CREATININE 0.98  CALCIUM 9.7   Liver Function Tests: Recent Labs  Lab 11/26/23 1230  AST 33  ALT 26  ALKPHOS 68  BILITOT 1.1  PROT 6.9  ALBUMIN 3.7   Recent Labs  Lab 11/26/23 1230  LIPASE 31   No results for input(s): "AMMONIA" in the last 168 hours. CBC: Recent Labs  Lab 11/26/23 1230  WBC 10.3  NEUTROABS 8.4*  HGB 13.8  HCT 40.6  MCV 92.3  PLT 223   Cardiac Enzymes: No results for input(s): "CKTOTAL", "CKMB", "CKMBINDEX", "TROPONINI" in the last 168 hours.  BNP (last 3 results) No results for input(s): "BNP" in the last 8760 hours.  ProBNP (last 3 results) No results for input(s): "PROBNP" in the last 8760 hours.  CBG: No results for input(s): "GLUCAP" in the last 168 hours.  Radiological Exams on Admission: DG Abdomen 1 View Result Date: 11/26/2023 CLINICAL DATA:  NG tube placement. EXAM: ABDOMEN - 1 VIEW COMPARISON:  CT earlier today FINDINGS: Tip and side port of the enteric tube below the diaphragm in the stomach. Dilated small bowel in the left upper abdomen. Excreted IV contrast in the renal collecting system from earlier CT. IMPRESSION: Tip and side port of the enteric tube below the diaphragm in the stomach. Electronically Signed   By: Narda Rutherford M.D.   On: 11/26/2023 22:30   DG Chest Portable 1 View Result Date: 11/26/2023 CLINICAL DATA:  Hypoxia.  Cough. EXAM: PORTABLE CHEST 1 VIEW COMPARISON:  05/18/2023 FINDINGS: Left-sided pacemaker in place. TAVR. Normal heart size with stable mediastinal contours. Aortic atherosclerosis. No focal airspace  disease, pleural effusion, pulmonary edema or pneumothorax. No acute osseous findings. IMPRESSION: No acute findings. Electronically Signed   By: Narda Rutherford M.D.   On: 11/26/2023 22:29   CT ABDOMEN PELVIS W CONTRAST Result Date: 11/26/2023 CLINICAL DATA:  Nausea and vomiting. EXAM: CT ABDOMEN AND PELVIS WITH CONTRAST TECHNIQUE: Multidetector CT imaging of the abdomen and pelvis was performed using the standard protocol following bolus administration of intravenous contrast. RADIATION DOSE REDUCTION: This exam was performed according to the departmental dose-optimization program which includes automated exposure control, adjustment of the mA and/or kV according to patient size and/or use of iterative reconstruction technique. CONTRAST:  OMNIPAQUE IOHEXOL 300 MG/ML  SOLN COMPARISON:  CT abdomen and pelvis 05/18/2023 FINDINGS: Lower chest: Focal ground-glass nodular density in the left lower lobe measures 10 x 7 mm. There is atelectasis in the right lung base. Hepatobiliary: A large gallstone is present measuring 4.2 cm. There is no biliary ductal dilatation. The liver is within normal limits. Pancreas: Unremarkable. No pancreatic ductal dilatation or surrounding inflammatory changes. Spleen: Normal in size without focal abnormality. Adrenals/Urinary Tract: Mild left renal atrophy present. No hydronephrosis or perinephric fluid. There are bilateral renal cysts measuring up to 2 cm. Adrenal glands and bladder are within normal limits. Stomach/Bowel: No free air. There is a short segment of small bowel wall thickening in the right lower quadrant. This is transition point for small bowel obstruction. Proximal small bowel is mildly dilated with air-fluid levels. The stomach is mildly dilated with air-fluid level. There is wall thickening of the distal esophagus. The appendix is within normal limits. There is sigmoid and descending colon diverticulosis. Vascular/Lymphatic: Aortic atherosclerosis. No enlarged  abdominal or pelvic lymph nodes. Reproductive: Uterus and bilateral adnexa are unremarkable. Other: No abdominal wall hernia or abnormality. No abdominopelvic ascites. Musculoskeletal: Multilevel degenerative changes affect the spine.  Right hip arthroplasty is present. There are degenerative changes of the left hip. IMPRESSION: 1. Small-bowel obstruction with transition point in the right lower quadrant where there is a short segment of small bowel wall thickening. Findings may be related to infectious/inflammatory enteritis, however neoplasm is not excluded. 2. Cholelithiasis. 3. Colonic diverticulosis. 4. Wall thickening of the distal esophagus worrisome for esophagitis. 5. 10 x 7 mm ground-glass nodular density in the left lower lobe. Initial follow-up with CT at 6 months is recommended to confirm persistence. If persistent, repeat CT is recommended every 2 years until 5 years of stability has been established. This recommendation follows the consensus statement: Guidelines for Management of Incidental Pulmonary Nodules Detected on CT Images: From the Fleischner Society 2017; Radiology 2017; 284:228-243. 6. Bosniak I benign renal cyst measuring 2 cm. No follow-up imaging is recommended. JACR 2018 Feb; 264-273, Management of the Incidental Renal Mass on CT, Radio[employer] 2021; 814-848, Bosniak Classification of Cystic Renal Masses, Version 2019. Aortic Atherosclerosis (ICD10-I70.0). Electronically Signed   By: Darliss Cheney M.D.   On: 11/26/2023 20:40   US Abdomen Limited RUQ (LIVER/GB) Result Date: 11/26/2023 CLINICAL DATA:  Severe nausea and vomiting since last night. EXAM: ULTRASOUND ABDOMEN LIMITED RIGHT UPPER QUADRANT COMPARISON:  CT abdomen pelvis 05/18/2023. FINDINGS: Gallbladder: Shadowing echogenic stones measure up to 1.4 cm and do not appear mobile. No wall thickening. Negative sonographic Murphy sign. Common bile duct: Diameter: 6 mm, within normal limits for age. No intrahepatic biliary ductal  dilatation. Liver: No focal lesion identified. Within normal limits in parenchymal echogenicity. Portal vein is patent on color Doppler imaging with normal direction of blood flow towards the liver. Other: Incidental imaging of the right kidney shows fullness of the right renal pelvis which likely corresponds to a prominent extrarenal pelvis as on 05/18/2023. IMPRESSION: Cholelithiasis.  No acute findings. Electronically Signed   By: Leanna Battles M.D.   On: 11/26/2023 16:10    EKG: I independently viewed the EKG done and my findings are as followed: Atrial fibrillation with rate control with prolonged QTc 506 ms  Assessment/Plan Present on Admission:  Small bowel obstruction (HCC)  Gastroesophageal reflux disease  Essential hypertension  Principal Problem:   Small bowel obstruction (HCC) Active Problems:   Gastroesophageal reflux disease   Essential hypertension   Esophagitis   Pulmonary nodule   Prolonged QT interval   Type 2 diabetes mellitus with hyperglycemia (HCC)  Small bowel obstruction possibly due to enteritis Continue NG tube  Continue NPO at this time with plan to advance diet as tolerated Continue IV hydration Continue IV morphine 2 mg q.4h p.r.n. for moderate/severe pain Continue Compazine p.r.n. for nausea/vomiting General Surgery (Dr.Pappayliou) was consulted and will follow-up with patient in the morning Gastroenterology will be consulted and we shall await further recommendations  Possible esophagitis GERD Continue Protonix  Pulmonary nodule CT abdomen pelvis with contrast showed 0 x 7 mm ground-glass nodular density in the left lower lobe. Initial follow-up with CT at 6 months is recommended to confirm persistence. If persistent, repeat CT is recommended every 2 years until 5 years of stability has been established per radiologist recommendation  Prolonged QT interval QTc 506 ms Avoid QT prolonging drugs Magnesium level will be checked Repeat EKG in the  morning  Essential hypertension Continue IV hydralazine 10 mg every 6 hours as needed for SBP > 170 Consider starting patient's home metoprolol when she resumes oral intake  Type 2 diabetes mellitus with hyperglycemia Continue ISS and hypoglycemia protocol Glimepiride will  be held at this time   DVT prophylaxis: SCDs  Code Status: Full code  Family Communication: None at bedside  Consults: General Surgery, gastroenterology  Severity of Illness: The appropriate patient status for this patient is INPATIENT. Inpatient status is judged to be reasonable and necessary in order to provide the required intensity of service to ensure the patient's safety. The patient's presenting symptoms, physical exam findings, and initial radiographic and laboratory data in the context of their chronic comorbidities is felt to place them at high risk for further clinical deterioration. Furthermore, it is not anticipated that the patient will be medically stable for discharge from the hospital within 2 midnights of admission.   * I certify that at the point of admission it is my clinical judgment that the patient will require inpatient hospital care spanning beyond 2 midnights from the point of admission due to high intensity of service, high risk for further deterioration and high frequency of surveillance required.*  Author: Frankey Shown, DO 11/27/2023 1:30 AM  For on call review [email or web address].

## 2023-11-26 NOTE — ED Triage Notes (Signed)
 Pt c/o nausea and vomiting that started last night; pt has been diagnosed with gallstones and had a drain for 15 months and had it removed x 3 months ago

## 2023-11-26 NOTE — ED Provider Notes (Signed)
 Care transferred to me. Patient is now vomiting again, and so will give some more nausea medicine. Is having some right sided back pain, will also get U/A and get a CT.  Patient's CT shows a small bowel obstruction.  Discussed with Dr. Robyne Peers, who will see in the morning. Recommends NG tube.  Patient will also be given fluids.  Discussed with Dr. Thomes Dinning for admission.  Consider GI consult in the morning given the possibility of enteritis causing this.     Pricilla Loveless, MD 11/27/23 [phone]

## 2023-11-26 NOTE — ED Provider Notes (Signed)
 [place] EMERGENCY DEPARTMENT AT [facility] Provider Note   CSN: [id number] Arrival date & time: 11/26/23  1156     History {Add pertinent medical, surgical, social history, OB history to HPI:1} Chief Complaint  Patient presents with   Nausea    Denise Parrish is a 89 y.o. female.  HPI Elderly female presents with concern of nausea, vomiting.  Onset was last night.  She notes that symptoms are similar to those she experienced last year while she was undergoing therapy for gallstones.  2 years ago the patient was diagnosed with gallstones, had percutaneous drain placed.  This was removed about 3 months ago.  She notes that she has had mild symptoms, but not as profound as those she experienced today until today. No fever, no chest pain, no dyspnea, and symptoms have improved.  EMS reports no hemodynamic instability in transport.    Home Medications Prior to Admission medications   Medication Sig Start Date End Date Taking? Authorizing Provider  zinc sulfate, 50mg  elemental zinc, 220 (50 Zn) MG capsule Take 220 mg by mouth daily.   Yes [provider]  acetaminophen (TYLENOL) 500 MG tablet Take 500 mg by mouth every 8 (eight) hours as needed for mild pain.   Yes [provider]  ALPRAZolam Prudy Feeler) 0.5 MG tablet Take 0.5 mg by mouth at bedtime.   Yes [provider]  Cholecalciferol (VITAMIN D3 PO) Take 1 tablet by mouth daily.   Yes [provider]  clopidogrel (PLAVIX) 75 MG tablet TAKE ONE (1) TABLET BY MOUTH EVERY DAY WITH BREAKFAST 08/22/23  Yes Lanier Prude, MD  FEROSUL 325 (65 Fe) MG tablet TAKE ONE TABLET (325MG  TOTAL) BY MOUTH DAILY 04/10/23  Yes Clearance Coots, Kristen S, PA-C  glimepiride (AMARYL) 1 MG tablet Take 1 mg by mouth daily with breakfast.   Yes [provider]  meclizine (ANTIVERT) 12.5 MG tablet Take 1 tablet (12.5 mg total) by mouth 3 (three) times daily as needed for dizziness. 12/14/19  Yes Raeford Razor,  MD  metoprolol tartrate (LOPRESSOR) 25 MG tablet Take 0.5 tablets (12.5 mg total) by mouth 2 (two) times daily. 05/07/20  Yes Mikhail, Nita Sells, DO  Multiple Vitamin (MULTIVITAMIN) tablet Take 1 tablet by mouth daily.   Yes [provider]  Multiple Vitamins-Minerals (PRESERVISION AREDS 2 PO) Take 1 tablet by mouth daily. Takes 1 tablet in the morning and 1 tablet at night for eyes.   Yes [provider]  pantoprazole (PROTONIX) 40 MG tablet TAKE ONE TABLET (40 MG TOTAL) BY MOUTH ONCE DAILY. 08/22/23  Yes Lanier Prude, MD  vitamin C (ASCORBIC ACID) 500 MG tablet Take 500 mg by mouth daily.   Yes [provider]      Allergies    Patient has no known allergies.    Review of Systems   Review of Systems  Physical Exam Updated Vital Signs BP 111/69   Pulse 80   Resp 20   Ht 5\' 6"  (1.676 m)   Wt 73.5 kg   SpO2 100%   BMI 26.15 kg/m  Physical Exam Vitals and nursing note reviewed.  Constitutional:      General: She is not in acute distress.    Appearance: She is well-developed.  HENT:     Head: Normocephalic and atraumatic.  Eyes:     Conjunctiva/sclera: Conjunctivae normal.  Cardiovascular:     Rate and Rhythm: Normal rate. Rhythm irregular.  Pulmonary:     Effort: Pulmonary effort  is normal. No respiratory distress.     Breath sounds: Normal breath sounds. No stridor.  Abdominal:     General: There is no distension.     Tenderness: There is abdominal tenderness in the right upper quadrant.  Skin:    General: Skin is warm and dry.  Neurological:     Mental Status: She is alert and oriented to person, place, and time.     Cranial Nerves: No cranial nerve deficit.  Psychiatric:        Mood and Affect: Mood normal.     ED Results / Procedures / Treatments   Labs (all labs ordered are listed, but only abnormal results are displayed) Labs Reviewed  COMPREHENSIVE METABOLIC PANEL - Abnormal; Notable for the following components:      Result  Value   Glucose, Bld 157 (*)    GFR, Estimated 52 (*)    All other components within normal limits  CBC WITH DIFFERENTIAL/PLATELET - Abnormal; Notable for the following components:   Neutro Abs 8.4 (*)    All other components within normal limits  LIPASE, BLOOD    EKG None  Radiology No results found.  Procedures Procedures  {Document cardiac monitor, telemetry assessment procedure when appropriate:1}  Medications Ordered in ED Medications  sodium chloride 0.9 % bolus 500 mL (0 mLs Intravenous Stopped 11/26/23 1323)  ondansetron (ZOFRAN) injection 4 mg (4 mg Intravenous Given 11/26/23 1233)    ED Course/ Medical Decision Making/ A&P   {   Click here for ABCD2, HEART and other calculatorsREFRESH Note before signing :1}                              Medical Decision Making Elderly female with known hepatobiliary dysfunction presents with right upper quadrant abdominal pain, nausea, vomiting.  Concern for symptomatic gallstones versus cholecystitis or other acute intra-abdominal processes. Patient placed on monitors, ultrasound, labs started. Cardiac 80 irregular  pulse ox 100% on room air normal  Amount and/or Complexity of Data Reviewed Independent Historian: EMS External Data Reviewed: notes. Labs: ordered. Decision-making details documented in ED Course. Radiology: ordered and independent interpretation performed. Decision-making details documented in ED Course.  Risk Prescription drug management.   ***  {Document critical care time when appropriate:1} {Document review of labs and clinical decision tools ie heart score, Chads2Vasc2 etc:1}  {Document your independent review of radiology images, and any outside records:1} {Document your discussion with family members, caretakers, and with consultants:1} {Document social determinants of health affecting pt's care:1} {Document your decision making why or why not admission, treatments were needed:1} Final Clinical  Impression(s) / ED Diagnoses Final diagnoses:  None    Rx / DC Orders ED Discharge Orders     None

## 2023-11-27 ENCOUNTER — Encounter (HOSPITAL_COMMUNITY): Payer: Self-pay | Admitting: Internal Medicine

## 2023-11-27 DIAGNOSIS — K209 Esophagitis, unspecified without bleeding: Secondary | ICD-10-CM | POA: Insufficient documentation

## 2023-11-27 DIAGNOSIS — R935 Abnormal findings on diagnostic imaging of other abdominal regions, including retroperitoneum: Secondary | ICD-10-CM

## 2023-11-27 DIAGNOSIS — R911 Solitary pulmonary nodule: Secondary | ICD-10-CM | POA: Insufficient documentation

## 2023-11-27 DIAGNOSIS — K56609 Unspecified intestinal obstruction, unspecified as to partial versus complete obstruction: Secondary | ICD-10-CM | POA: Diagnosis not present

## 2023-11-27 DIAGNOSIS — E1165 Type 2 diabetes mellitus with hyperglycemia: Secondary | ICD-10-CM | POA: Insufficient documentation

## 2023-11-27 DIAGNOSIS — R9431 Abnormal electrocardiogram [ECG] [EKG]: Secondary | ICD-10-CM | POA: Insufficient documentation

## 2023-11-27 LAB — COMPREHENSIVE METABOLIC PANEL
ALT: 28 U/L (ref 0–44)
AST: 31 U/L (ref 15–41)
Albumin: 3.3 g/dL — ABNORMAL LOW (ref 3.5–5.0)
Alkaline Phosphatase: 68 U/L (ref 38–126)
Anion gap: 8 (ref 5–15)
BUN: 18 mg/dL (ref 8–23)
CO2: 29 mmol/L (ref 22–32)
Calcium: 8.7 mg/dL — ABNORMAL LOW (ref 8.9–10.3)
Chloride: 102 mmol/L (ref 98–111)
Creatinine, Ser: 1.12 mg/dL — ABNORMAL HIGH (ref 0.44–1.00)
GFR, Estimated: 44 mL/min — ABNORMAL LOW (ref >60)
Glucose, Bld: 160 mg/dL — ABNORMAL HIGH (ref 70–99)
Potassium: 3.6 mmol/L (ref 3.5–5.1)
Sodium: 139 mmol/L (ref 135–145)
Total Bilirubin: 1.3 mg/dL — ABNORMAL HIGH (ref 0.0–1.2)
Total Protein: 6.4 g/dL — ABNORMAL LOW (ref 6.5–8.1)

## 2023-11-27 LAB — HEMOGLOBIN A1C
Hgb A1c MFr Bld: 5.8 % — ABNORMAL HIGH (ref 4.8–5.6)
Mean Plasma Glucose: 119.76 mg/dL

## 2023-11-27 LAB — GLUCOSE, CAPILLARY
Glucose-Capillary: 106 mg/dL — ABNORMAL HIGH (ref 70–99)
Glucose-Capillary: 115 mg/dL — ABNORMAL HIGH (ref 70–99)
Glucose-Capillary: 115 mg/dL — ABNORMAL HIGH (ref 70–99)
Glucose-Capillary: 124 mg/dL — ABNORMAL HIGH (ref 70–99)
Glucose-Capillary: 138 mg/dL — ABNORMAL HIGH (ref 70–99)
Glucose-Capillary: 179 mg/dL — ABNORMAL HIGH (ref 70–99)

## 2023-11-27 LAB — CBC
HCT: 40.2 % (ref 36.0–46.0)
Hemoglobin: 13.3 g/dL (ref 12.0–15.0)
MCH: 31.4 pg (ref 26.0–34.0)
MCHC: 33.1 g/dL (ref 30.0–36.0)
MCV: 94.8 fL (ref 80.0–100.0)
Platelets: 224 10*3/uL (ref 150–400)
RBC: 4.24 MIL/uL (ref 3.87–5.11)
RDW: 13.4 % (ref 11.5–15.5)
WBC: 10.7 10*3/uL — ABNORMAL HIGH (ref 4.0–10.5)
nRBC: 0 % (ref 0.0–0.2)

## 2023-11-27 LAB — PHOSPHORUS: Phosphorus: 4.9 mg/dL — ABNORMAL HIGH (ref 2.5–4.6)

## 2023-11-27 LAB — RESP PANEL BY RT-PCR (RSV, FLU A&B, COVID)  RVPGX2
Influenza A by PCR: NEGATIVE
Influenza B by PCR: NEGATIVE
Resp Syncytial Virus by PCR: NEGATIVE
SARS Coronavirus 2 by RT PCR: NEGATIVE

## 2023-11-27 LAB — MAGNESIUM: Magnesium: 2.1 mg/dL (ref 1.7–2.4)

## 2023-11-27 MED ORDER — PHENOL 1.4 % MT LIQD
1.0000 | OROMUCOSAL | Status: DC | PRN
  Filled 2023-11-27: qty 177

## 2023-11-27 MED ORDER — SODIUM CHLORIDE 0.9 % IV SOLN: INTRAVENOUS | Status: AC

## 2023-11-27 MED ORDER — PANTOPRAZOLE SODIUM 40 MG IV SOLR
40.0000 mg | INTRAVENOUS | Status: DC
  Administered 2023-11-27 – 2023-11-29 (×3): 40 mg via INTRAVENOUS
  Filled 2023-11-27 (×3): qty 10

## 2023-11-27 MED ORDER — INSULIN ASPART 100 UNIT/ML IJ SOLN
0.0000 [IU] | INTRAMUSCULAR | Status: DC
  Administered 2023-11-27 (×2): 1 [IU] via SUBCUTANEOUS
  Administered 2023-11-27: 2 [IU] via SUBCUTANEOUS
  Administered 2023-11-28 – 2023-11-30 (×2): 1 [IU] via SUBCUTANEOUS

## 2023-11-27 MED ORDER — PROCHLORPERAZINE EDISYLATE 10 MG/2ML IJ SOLN
10.0000 mg | Freq: Four times a day (QID) | INTRAMUSCULAR | Status: DC | PRN
Start: 2023-11-27 — End: 2023-11-30
  Administered 2023-11-28: 10 mg via INTRAVENOUS
  Filled 2023-11-27: qty 2

## 2023-11-27 MED ORDER — ALPRAZOLAM 0.5 MG PO TABS
0.5000 mg | ORAL_TABLET | Freq: Every day | ORAL | Status: DC
  Administered 2023-11-27 – 2023-11-29 (×3): 0.5 mg
  Filled 2023-11-27 (×3): qty 1

## 2023-11-27 MED ORDER — BISACODYL 10 MG RE SUPP
10.0000 mg | Freq: Every day | RECTAL | Status: DC
  Administered 2023-11-27 – 2023-11-30 (×4): 10 mg via RECTAL
  Filled 2023-11-27 (×4): qty 1

## 2023-11-27 NOTE — Consult Note (Signed)
 [facility] Consult  Reason for Consult: Small bowel obstruction, small bowel wall thickening Referring Physician: Dr. Criss Alvine  Chief Complaint   Nausea     HPI: Denise Parrish is a 89 y.o. female who presents with a 1 day history of nausea and vomiting.  She states that she started becoming nauseous on Sunday, with multiple episodes of vomiting yesterday which prompted her to present to the emergency department.  She denies any significant history of abdominal pain.  She initially thought this could be related to her gallstones.  She has never had any symptoms like this previously.  She confirms passing flatus today with her last bowel movement being on Sunday.  She normally does not have daily bowel movements.  Her past medical history significant for coronary artery disease status post DES placement in 2021 on Plavix, right carotid stenosis status post endarterectomy, diabetes, emphysema, GERD, hyperlipidemia, hypertension, aortic stenosis status post TAVR in 2023, and small bowel lesion.  She last took her Plavix on Sunday.  She was evaluated for a GI bleed back in 2022, at which time she underwent both an EGD and a colonoscopy without any active bleeding noted.  She subsequently underwent a capsule study which demonstrated a distal small bowel nodule versus mass without ulceration.  This area was thought to be the source of her bleeding.  It was never biopsied or treated in any other way.  She has had multiple vascular surgeries but denies any history of abdominal surgery.  In the ED, she was noted to be hemodynamically stable.  Nausea failed to improve with multiple doses of antiemetics.  She initially underwent abdominal ultrasound which demonstrated cholelithiasis without evidence of acute cholecystitis, and subsequently underwent a CT of the abdomen and pelvis which demonstrated a small bowel obstruction with transition point in the right lower quadrant secondary to a  short segment of small bowel wall thickening, potentially from infectious or inflammatory enteritis but neoplasm could not be excluded.  She had no leukocytosis and no other significant lab abnormalities.  NG tube was placed in the emergency department.  Since NG tube placement, the patient states that she has no nausea or vomiting.  She also denies any abdominal pain.  She confirms passing flatus this morning and had a small bowel movement after her suppository.  Past Medical History:  Diagnosis Date   Anxiety    CAD (coronary artery disease)    a. DES placement to the LCx in 08/2020 and medical management was recommended of her residual disease   Carotid stenosis, right 10/16/2019   75% on CTA neck   Cholelithiasis    on u/s 09/2011   Degenerative joint disease    Right THA in 1995; cervical discectomy and fusion-2001   Diabetes mellitus, type 2 (HCC)    + neuropathy   Emphysema    Gastroesophageal reflux disease    Hiatal hernia    History of CVA (cerebrovascular accident)    Hyperlipidemia    Hypertension    IDA (iron deficiency anemia)    labs 09/2011   Mild cognitive impairment    Obesity    Osteoporosis    Pneumonia    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   s/p TAVR with a 23mm Medtronic Evolut Pro+ via the TF approach    Severe aortic stenosis    Sigmoid diverticulosis    Small bowel lesion    On Given's capsule; Dr Sharin Mons 07/2012, no further w/u needed  Past Surgical History:  Procedure Laterality Date   ANTERIOR CERVICAL DISCECTOMY  2001   C4-5, allograft, fixation   BREAST LUMPECTOMY     benign   CATARACT EXTRACTION W/ INTRAOCULAR LENS IMPLANT  2007   Left   COLONOSCOPY  2006   COLONOSCOPY  04/03/2012   Dr. Merlyn Lot diverticulosis, negative microscopic  colitis    COLONOSCOPY WITH PROPOFOL N/A 11/27/2020   Surgeon: Dolores Frame, MD;  diverticulosis, no active bleeding.  Two 2-4 mm polyps not removed in the  setting of Plavix.   CORONARY STENT INTERVENTION N/A 09/08/2020   Procedure: CORONARY STENT INTERVENTION;  Surgeon: Tonny Bollman, MD;  Location: [facility] INVASIVE CV LAB;  Service: Cardiovascular;  Laterality: N/A;   ENDARTERECTOMY Right 10/21/2019   Procedure: ENDARTERECTOMY CAROTID RIGHT;  Surgeon: Maeola Harman, MD;  Location: [facility] OR;  Service: Vascular;  Laterality: Right;   ESOPHAGOGASTRODUODENOSCOPY  04/03/2012   Dr. Dot Been hernia, chronic gastritis on bx   ESOPHAGOGASTRODUODENOSCOPY (EGD) WITH PROPOFOL N/A 04/28/2020   Procedure: ESOPHAGOGASTRODUODENOSCOPY (EGD) WITH PROPOFOL;  Surgeon: Kathi Der, MD;  Location: [facility] ENDOSCOPY;  Service: Gastroenterology;  Laterality: N/A;   ESOPHAGOGASTRODUODENOSCOPY (EGD) WITH PROPOFOL N/A 11/27/2020   Surgeon: Marguerita Merles, Reuel Boom, MD; findings suggestive of short segment Barrett's, normal stomach, and small bowel.   FEMORAL ARTERY EXPLORATION Right 09/28/2020   Procedure: RIGHT GROIN EXPLORATION WITH REPAIR RIGHT COMMON FEMORAL ARTERY;  Surgeon: Larina Earthly, MD;  Location: [facility] OR;  Service: Vascular;  Laterality: Right;   GIVENS CAPSULE STUDY  05/09/2012   RMR: an unclear raised area of small bowel was noted, with features almost  characteristic of very small polyp. This was without villous  blunting or any evidence of active bleeding; yet, the area of  concern appeared to be erythematous. However, this could simply  be a light reflection on a normal variation of the small bowel. REFERRED TO DR. GILLIAM, APPT FOR NOVEMBER 2013.    GIVENS CAPSULE STUDY N/A 04/30/2020   Procedure: GIVENS CAPSULE STUDY;  Surgeon: Kathi Der, MD;  Location: [facility] ENDOSCOPY;  Service: Gastroenterology;  Laterality: N/A;   GIVENS CAPSULE STUDY  11/27/2020    Surgeon: Marguerita Merles, Reuel Boom, MD;  questionable nodule/small mass in the distal small bowel but without ulceration.  Evidence of hematochezia throughout the colon but no fresh blood.     HIP ARTHROPLASTY Right    INSERT / REPLACE / REMOVE PACEMAKER     IR CHOLANGIOGRAM EXISTING TUBE  07/13/2022   IR REMOVAL BILIARY DRAIN  08/09/2022   PACEMAKER IMPLANT N/A 07/12/2021   Procedure: PACEMAKER IMPLANT;  Surgeon: Lanier Prude, MD;  Location: [facility] INVASIVE CV LAB;  Service: Cardiovascular;  Laterality: N/A;   PATCH ANGIOPLASTY Right 10/21/2019   Procedure: PATCH ANGIOPLASTY USING Livia Snellen BIOLOGIC PATCH;  Surgeon: Maeola Harman, MD;  Location: [facility] OR;  Service: Vascular;  Laterality: Right;   POLYPECTOMY  04/28/2020   Procedure: POLYPECTOMY;  Surgeon: Kathi Der, MD;  Location: [facility] ENDOSCOPY;  Service: Gastroenterology;;   RIGHT/LEFT HEART CATH AND CORONARY ANGIOGRAPHY N/A 09/08/2020   Procedure: RIGHT/LEFT HEART CATH AND CORONARY ANGIOGRAPHY;  Surgeon: Tonny Bollman, MD;  Location: [facility] INVASIVE CV LAB;  Service: Cardiovascular;  Laterality: N/A;   TEE WITHOUT CARDIOVERSION N/A 09/28/2020   Procedure: TRANSESOPHAGEAL ECHOCARDIOGRAM (TEE);  Surgeon: Tonny Bollman, MD;  Location: [facility] OR;  Service: Open Heart Surgery;  Laterality: N/A;   TONSILLECTOMY     TOTAL HIP ARTHROPLASTY  1995   Right   TRANSCATHETER AORTIC VALVE REPLACEMENT, TRANSFEMORAL  N/A 09/28/2020   Procedure: TRANSCATHETER AORTIC VALVE REPLACEMENT, TRANSFEMORAL;  Surgeon: Tonny Bollman, MD;  Location: [facility] OR;  Service: Open Heart Surgery;  Laterality: N/A;    Family History  Problem Relation Age of Onset   Cirrhosis Mother        no etoh use   CAD Father    Diabetes Mother        + brother   Heart failure Father    Hypertension Father        + brother   Cancer Brother        lung, age 25   Uterine cancer Sister    Colon cancer Neg Hx     Social History   Tobacco Use   Smoking status: Never    Passive exposure: Never   Smokeless tobacco: Never   Tobacco comments:    Quit x 50 years  Vaping Use   Vaping status: Never Used  Substance Use Topics   Alcohol use: Never   Drug  use: Never    Medications: I have reviewed the patient's current medications.  No Known Allergies   ROS:  Pertinent items are noted in HPI.  Blood pressure 134/61, pulse 81, temperature 98.5 F (36.9 C), resp. rate 18, height 5\' 6"  (1.676 m), weight 73.5 kg, SpO2 97%. Physical Exam Vitals reviewed.  Constitutional:      Appearance: Normal appearance.  HENT:     Head: Normocephalic and atraumatic.     Nose:     Comments: NG tube in place with gastric contents in canister Eyes:     Extraocular Movements: Extraocular movements intact.     Pupils: Pupils are equal, round, and reactive to light.  Cardiovascular:     Rate and Rhythm: Normal rate.  Pulmonary:     Effort: Pulmonary effort is normal.  Abdominal:     Comments: Abdomen soft, mild distention, no percussion tenderness, nontender to palpation; no rigidity, guarding, rebound tenderness; negative Murphy sign  Musculoskeletal:        General: Normal range of motion.     Cervical back: Normal range of motion.  Skin:    General: Skin is warm and dry.  Neurological:     General: No focal deficit present.     Mental Status: She is alert and oriented to person, place, and time.  Psychiatric:        Mood and Affect: Mood normal.        Behavior: Behavior normal.     Results: Results for orders placed or performed during the hospital encounter of 11/26/23 (from the past 48 hours)  Comprehensive metabolic panel     Status: Abnormal   Collection Time: 11/26/23 12:30 PM  Result Value Ref Range   Sodium 138 135 - 145 mmol/L   Potassium 3.9 3.5 - 5.1 mmol/L   Chloride 100 98 - 111 mmol/L   CO2 25 22 - 32 mmol/L   Glucose, Bld 157 (H) 70 - 99 mg/dL    Comment: Glucose reference range applies only to samples taken after fasting for at least 8 hours.   BUN 21 8 - 23 mg/dL   Creatinine, Ser 1.61 0.44 - 1.00 mg/dL   Calcium 9.7 8.9 - 09.6 mg/dL   Total Protein 6.9 6.5 - 8.1 g/dL   Albumin 3.7 3.5 - 5.0 g/dL   AST 33 15 - 41  U/L   ALT 26 0 - 44 U/L   Alkaline Phosphatase 68 38 - 126 U/L   Total  Bilirubin 1.1 0.0 - 1.2 mg/dL   GFR, Estimated 52 (L) >60 mL/min    Comment: (NOTE) Calculated using the CKD-EPI Creatinine Equation (2021)    Anion gap 13 5 - 15    Comment: Performed at [facility], [address], [place], [place] [address]  CBC with Differential     Status: Abnormal   Collection Time: 11/26/23 12:30 PM  Result Value Ref Range   WBC 10.3 4.0 - 10.5 K/uL   RBC 4.40 3.87 - 5.11 MIL/uL   Hemoglobin 13.8 12.0 - 15.0 g/dL   HCT 40.9 81.1 - 91.4 %   MCV 92.3 80.0 - 100.0 fL   MCH 31.4 26.0 - 34.0 pg   MCHC 34.0 30.0 - 36.0 g/dL   RDW 78.2 95.6 - 21.3 %   Platelets 223 150 - 400 K/uL   nRBC 0.0 0.0 - 0.2 %   Neutrophils Relative % 82 %   Neutro Abs 8.4 (H) 1.7 - 7.7 K/uL   Lymphocytes Relative 11 %   Lymphs Abs 1.2 0.7 - 4.0 K/uL   Monocytes Relative 7 %   Monocytes Absolute 0.7 0.1 - 1.0 K/uL   Eosinophils Relative 0 %   Eosinophils Absolute 0.0 0.0 - 0.5 K/uL   Basophils Relative 0 %   Basophils Absolute 0.0 0.0 - 0.1 K/uL   Immature Granulocytes 0 %   Abs Immature Granulocytes 0.04 0.00 - 0.07 K/uL    Comment: Performed at [facility], [address] [place], [place] [address]  Lipase, blood     Status: None   Collection Time: 11/26/23 12:30 PM  Result Value Ref Range   Lipase 31 11 - 51 U/L    Comment: Performed at [facility], [address] [place], [place] [address]  Hemoglobin A1c     Status: Abnormal   Collection Time: 11/26/23 12:30 PM  Result Value Ref Range   Hgb A1c MFr Bld 5.8 (H) 4.8 - 5.6 %    Comment: (NOTE) Pre diabetes:          5.7%-6.4%  Diabetes:              >6.4%  Glycemic control for   <7.0% adults with diabetes    Mean Plasma Glucose 119.76 mg/dL    Comment: Performed at [facility], [address], [place], [place] [address]  Urinalysis, w/ Reflex to Culture (Infection Suspected) -Urine, Clean Catch     Status: Abnormal   Collection Time:  11/26/23  5:42 PM  Result Value Ref Range   Specimen Source URINE, CLEAN CATCH    Color, Urine YELLOW YELLOW   APPearance CLOUDY (A) CLEAR   Specific Gravity, Urine 1.017 1.005 - 1.030   pH 9.0 (H) 5.0 - 8.0   Glucose, UA NEGATIVE NEGATIVE mg/dL   Hgb urine dipstick NEGATIVE NEGATIVE   Bilirubin Urine NEGATIVE NEGATIVE   Ketones, ur 5 (A) NEGATIVE mg/dL   Protein, ur 30 (A) NEGATIVE mg/dL   Nitrite NEGATIVE NEGATIVE   Leukocytes,Ua NEGATIVE NEGATIVE   RBC / HPF 0-5 0 - 5 RBC/hpf   WBC, UA 0-5 0 - 5 WBC/hpf    Comment:        Reflex urine culture not performed if WBC <=10, OR if Squamous epithelial cells >5. If Squamous epithelial cells >5 suggest recollection.    Bacteria, UA RARE (A) NONE SEEN   Squamous Epithelial / HPF 0-5 0 - 5 /HPF   Amorphous Crystal PRESENT     Comment:  Performed at [facility], [address] [place], [place] [address]  Resp panel by RT-PCR (RSV, Flu A&B, Covid) Anterior Nasal Swab     Status: None   Collection Time: 11/26/23 10:00 PM   Specimen: Anterior Nasal Swab  Result Value Ref Range   SARS Coronavirus 2 by RT PCR NEGATIVE NEGATIVE    Comment: (NOTE) SARS-CoV-2 target nucleic acids are NOT DETECTED.  The SARS-CoV-2 RNA is generally detectable in upper respiratory specimens during the acute phase of infection. The lowest concentration of SARS-CoV-2 viral copies this assay can detect is 138 copies/mL. A negative result does not preclude SARS-Cov-2 infection and should not be used as the sole basis for treatment or other patient management decisions. A negative result may occur with  improper specimen collection/handling, submission of specimen other than nasopharyngeal swab, presence of viral mutation(s) within the areas targeted by this assay, and inadequate number of viral copies(<138 copies/mL). A negative result must be combined with clinical observations, patient history, and epidemiological information. The expected result is  Negative.  Fact Sheet for Patients:  BloggerCourse.com  Fact Sheet for Healthcare Providers:  SeriousBroker.it  This test is no t yet approved or cleared by the [place] FDA and  has been authorized for detection and/or diagnosis of SARS-CoV-2 by FDA under an Emergency Use Authorization (EUA). This EUA will remain  in effect (meaning this test can be used) for the duration of the COVID-19 declaration under Section 564(b)(1) of the Act, 21 U.S.C.section 360bbb-3(b)(1), unless the authorization is terminated  or revoked sooner.       Influenza A by PCR NEGATIVE NEGATIVE   Influenza B by PCR NEGATIVE NEGATIVE    Comment: (NOTE) The Xpert Xpress SARS-CoV-2/FLU/RSV plus assay is intended as an aid in the diagnosis of influenza from Nasopharyngeal swab specimens and should not be used as a sole basis for treatment. Nasal washings and aspirates are unacceptable for Xpert Xpress SARS-CoV-2/FLU/RSV testing.  Fact Sheet for Patients: BloggerCourse.com  Fact Sheet for Healthcare Providers: SeriousBroker.it  This test is not yet approved or cleared by the [place] FDA and has been authorized for detection and/or diagnosis of SARS-CoV-2 by FDA under an Emergency Use Authorization (EUA). This EUA will remain in effect (meaning this test can be used) for the duration of the COVID-19 declaration under Section 564(b)(1) of the Act, 21 U.S.C. section 360bbb-3(b)(1), unless the authorization is terminated or revoked.     Resp Syncytial Virus by PCR NEGATIVE NEGATIVE    Comment: (NOTE) Fact Sheet for Patients: BloggerCourse.com  Fact Sheet for Healthcare Providers: SeriousBroker.it  This test is not yet approved or cleared by the [place] FDA and has been authorized for detection and/or diagnosis of SARS-CoV-2 by FDA under an  Emergency Use Authorization (EUA). This EUA will remain in effect (meaning this test can be used) for the duration of the COVID-19 declaration under Section 564(b)(1) of the Act, 21 U.S.C. section 360bbb-3(b)(1), unless the authorization is terminated or revoked.  Performed at [facility], [address] [place], [place] [address]   Glucose, capillary     Status: Abnormal   Collection Time: 11/27/23  4:03 AM  Result Value Ref Range   Glucose-Capillary 179 (H) 70 - 99 mg/dL    Comment: Glucose reference range applies only to samples taken after fasting for at least 8 hours.  Comprehensive metabolic panel     Status: Abnormal   Collection Time: 11/27/23  4:41 AM  Result Value Ref Range   Sodium  139 135 - 145 mmol/L   Potassium 3.6 3.5 - 5.1 mmol/L   Chloride 102 98 - 111 mmol/L   CO2 29 22 - 32 mmol/L   Glucose, Bld 160 (H) 70 - 99 mg/dL    Comment: Glucose reference range applies only to samples taken after fasting for at least 8 hours.   BUN 18 8 - 23 mg/dL   Creatinine, Ser 2.84 (H) 0.44 - 1.00 mg/dL   Calcium 8.7 (L) 8.9 - 10.3 mg/dL   Total Protein 6.4 (L) 6.5 - 8.1 g/dL   Albumin 3.3 (L) 3.5 - 5.0 g/dL   AST 31 15 - 41 U/L   ALT 28 0 - 44 U/L   Alkaline Phosphatase 68 38 - 126 U/L   Total Bilirubin 1.3 (H) 0.0 - 1.2 mg/dL   GFR, Estimated 44 (L) >60 mL/min    Comment: (NOTE) Calculated using the CKD-EPI Creatinine Equation (2021)    Anion gap 8 5 - 15    Comment: Performed at [facility], [address], [place], [place] [address]  CBC     Status: Abnormal   Collection Time: 11/27/23  4:41 AM  Result Value Ref Range   WBC 10.7 (H) 4.0 - 10.5 K/uL   RBC 4.24 3.87 - 5.11 MIL/uL   Hemoglobin 13.3 12.0 - 15.0 g/dL   HCT 01.0 27.2 - 53.6 %   MCV 94.8 80.0 - 100.0 fL   MCH 31.4 26.0 - 34.0 pg   MCHC 33.1 30.0 - 36.0 g/dL   RDW 64.4 03.4 - 74.2 %   Platelets 224 150 - 400 K/uL   nRBC 0.0 0.0 - 0.2 %    Comment: Performed at [facility], [address],  [place], [place] [address]  Magnesium     Status: None   Collection Time: 11/27/23  4:41 AM  Result Value Ref Range   Magnesium 2.1 1.7 - 2.4 mg/dL    Comment: Performed at [facility], [address], [place], [place] [address]  Phosphorus     Status: Abnormal   Collection Time: 11/27/23  4:41 AM  Result Value Ref Range   Phosphorus 4.9 (H) 2.5 - 4.6 mg/dL    Comment: Performed at [facility], [address], [place], [place] [address]  Glucose, capillary     Status: Abnormal   Collection Time: 11/27/23  7:33 AM  Result Value Ref Range   Glucose-Capillary 124 (H) 70 - 99 mg/dL    Comment: Glucose reference range applies only to samples taken after fasting for at least 8 hours.  Glucose, capillary     Status: Abnormal   Collection Time: 11/27/23 12:05 PM  Result Value Ref Range   Glucose-Capillary 138 (H) 70 - 99 mg/dL    Comment: Glucose reference range applies only to samples taken after fasting for at least 8 hours.    DG Abdomen 1 View Result Date: 11/26/2023 CLINICAL DATA:  NG tube placement. EXAM: ABDOMEN - 1 VIEW COMPARISON:  CT earlier today FINDINGS: Tip and side port of the enteric tube below the diaphragm in the stomach. Dilated small bowel in the left upper abdomen. Excreted IV contrast in the renal collecting system from earlier CT. IMPRESSION: Tip and side port of the enteric tube below the diaphragm in the stomach. Electronically Signed   By: Narda Rutherford M.D.   On: 11/26/2023 22:30   DG Chest Portable 1 View Result Date: 11/26/2023 CLINICAL DATA:  Hypoxia.  Cough. EXAM: PORTABLE CHEST 1 VIEW COMPARISON:  05/18/2023 FINDINGS: Left-sided pacemaker in place. TAVR. Normal heart size with stable mediastinal contours. Aortic atherosclerosis. No focal airspace disease, pleural effusion, pulmonary edema or pneumothorax. No acute osseous findings. IMPRESSION: No acute findings. Electronically Signed   By: Narda Rutherford M.D.   On: 11/26/2023 22:29   CT ABDOMEN PELVIS W  CONTRAST Result Date: 11/26/2023 CLINICAL DATA:  Nausea and vomiting. EXAM: CT ABDOMEN AND PELVIS WITH CONTRAST TECHNIQUE: Multidetector CT imaging of the abdomen and pelvis was performed using the standard protocol following bolus administration of intravenous contrast. RADIATION DOSE REDUCTION: This exam was performed according to the departmental dose-optimization program which includes automated exposure control, adjustment of the mA and/or kV according to patient size and/or use of iterative reconstruction technique. CONTRAST:  OMNIPAQUE IOHEXOL 300 MG/ML  SOLN COMPARISON:  CT abdomen and pelvis 05/18/2023 FINDINGS: Lower chest: Focal ground-glass nodular density in the left lower lobe measures 10 x 7 mm. There is atelectasis in the right lung base. Hepatobiliary: A large gallstone is present measuring 4.2 cm. There is no biliary ductal dilatation. The liver is within normal limits. Pancreas: Unremarkable. No pancreatic ductal dilatation or surrounding inflammatory changes. Spleen: Normal in size without focal abnormality. Adrenals/Urinary Tract: Mild left renal atrophy present. No hydronephrosis or perinephric fluid. There are bilateral renal cysts measuring up to 2 cm. Adrenal glands and bladder are within normal limits. Stomach/Bowel: No free air. There is a short segment of small bowel wall thickening in the right lower quadrant. This is transition point for small bowel obstruction. Proximal small bowel is mildly dilated with air-fluid levels. The stomach is mildly dilated with air-fluid level. There is wall thickening of the distal esophagus. The appendix is within normal limits. There is sigmoid and descending colon diverticulosis. Vascular/Lymphatic: Aortic atherosclerosis. No enlarged abdominal or pelvic lymph nodes. Reproductive: Uterus and bilateral adnexa are unremarkable. Other: No abdominal wall hernia or abnormality. No abdominopelvic ascites. Musculoskeletal: Multilevel degenerative  changes affect the spine. Right hip arthroplasty is present. There are degenerative changes of the left hip. IMPRESSION: 1. Small-bowel obstruction with transition point in the right lower quadrant where there is a short segment of small bowel wall thickening. Findings may be related to infectious/inflammatory enteritis, however neoplasm is not excluded. 2. Cholelithiasis. 3. Colonic diverticulosis. 4. Wall thickening of the distal esophagus worrisome for esophagitis. 5. 10 x 7 mm ground-glass nodular density in the left lower lobe. Initial follow-up with CT at 6 months is recommended to confirm persistence. If persistent, repeat CT is recommended every 2 years until 5 years of stability has been established. This recommendation follows the consensus statement: Guidelines for Management of Incidental Pulmonary Nodules Detected on CT Images: From the Fleischner Society 2017; Radiology 2017; 284:228-243. 6. Bosniak I benign renal cyst measuring 2 cm. No follow-up imaging is recommended. JACR 2018 Feb; 264-273, Management of the Incidental Renal Mass on CT, RadioGraphics 2021; 814-848, Bosniak Classification of Cystic Renal Masses, Version 2019. Aortic Atherosclerosis (ICD10-I70.0). Electronically Signed   By: Darliss Cheney M.D.   On: 11/26/2023 20:40   US Abdomen Limited RUQ (LIVER/GB) Result Date: 11/26/2023 CLINICAL DATA:  Severe nausea and vomiting since last night. EXAM: ULTRASOUND ABDOMEN LIMITED RIGHT UPPER QUADRANT COMPARISON:  CT abdomen pelvis 05/18/2023. FINDINGS: Gallbladder: Shadowing echogenic stones measure up to 1.4 cm and do not appear mobile. No wall thickening. Negative sonographic Murphy sign. Common bile duct: Diameter: 6 mm, within normal limits for age. No intrahepatic biliary ductal dilatation. Liver: No focal lesion identified. Within normal limits in  parenchymal echogenicity. Portal vein is patent on color Doppler imaging with normal direction of blood flow towards the liver. Other:  Incidental imaging of the right kidney shows fullness of the right renal pelvis which likely corresponds to a prominent extrarenal pelvis as on 05/18/2023. IMPRESSION: Cholelithiasis.  No acute findings. Electronically Signed   By: Leanna Battles M.D.   On: 11/26/2023 16:10     Assessment & Plan:  Denise Parrish is a 89 y.o. female who was admitted with concern for small bowel obstruction secondary to small bowel wall thickening from either enteritis, inflammation, or possible neoplasm.  Imaging and blood work evaluated by myself.  -I discussed with the patient and her family the pathophysiology of bowel obstructions.  I discussed that the current cause of her obstruction is secondary to wall thickening of her distal small bowel.  I am unable to characterize whether this thickening is related to the previous small bowel lesion that was noted on capsule studies a year or 2 ago.   -Will plan to manage her bowel obstruction conservatively with NG tube, NPO, and IV fluids.  -Dulcolax suppositories ordered -Monitor for return of bowel function.  Patient states she is currently passing flatus -Discussed case with GI.  Will initiate antibiotics, though this is likely not related to an infectious etiology.  Will potentially also initiate steroids for possible inflammatory bowel issue if patient fails to progress over the next day or 2 -I discussed with the patient and her family that if she fails to progress, she potentially would need surgical interventions.  We did discuss that any surgical interventions for her would be high risk especially given her cardiac history and her age.  Would not plan for any surgical interventions until Plavix has been out of her system for 5 days -Continue to hold Plavix -Appreciate GI and hospitalist recommendations  All questions were answered to the satisfaction of the patient and family.  Note: Portions of this report may have been transcribed using voice  recognition software. Every effort has been made to ensure accuracy; however, inadvertent computerized transcription errors may still be present.   -- Theophilus Kinds, DO [facility] [address] [place] [place] [place], [place] [address] [phone] (office)

## 2023-11-27 NOTE — Progress Notes (Signed)
   11/27/23 1140  TOC Brief Assessment  Insurance and Status Reviewed  Patient has primary care physician Yes  Home environment has been reviewed Home - alone  Prior level of function: Family support  Prior/Current Home Services Current home services (meals on wheels)  Social Drivers of Health Review SDOH reviewed no interventions necessary  Readmission risk has been reviewed Yes  Transition of care needs no transition of care needs at this time   Transition of Care Department [facility]) has reviewed patient and no TOC needs have been identified at this time. We will continue to monitor patient advancement through interdisciplinary progression rounds. If new patient transition needs arise, please place a TOC consult.

## 2023-11-27 NOTE — Progress Notes (Addendum)
 PROGRESS NOTE  Denise Parrish, is a 89 y.o. female, DOB - 05/19/1923, ZOX:[id number]  Admit date - 11/26/2023   Admitting Physician Frankey Shown, DO  Outpatient Primary MD for the patient is Lemar Lofty, DO  LOS - 1  Chief Complaint  Patient presents with   Nausea      Brief Narrative:  89 y.o. female with medical history significant of hypertension, hyperlipidemia, GERD, CAD s/p stent placement, complete AV block status post pacemaker, severe aortic stenosis s/pTAVR admitted on 11/26/2023 with SBO.     -Assessment and Plan: 1)SBO--??etiology..... Differential diagnosis include enteritis, inflammatory changes or possible neoplasm General Surgery and GI consult appreciated -NG tube in situ -Conservative management advised by GI and general surgery at this time -Patient and family also requesting conservative management at this time -They may consider outpatient CT enterography as well as repeat patency and capsule endoscopy down the road if symptoms do not resolve -Patient had BM with Dulcolax suppository -IV Compazine as needed nausea and vomiting  2) GERD/esophagitis--- continue Protonix  3)Pulmonary nodule CT abdomen pelvis with contrast showed 0 x 7 mm ground-glass nodular density in the left lower lobe. -Patient is 89 years old, patient and family would like to defer further investigations at this time  4)HTN--- metoprolol on hold  -continue hydralazine as needed  5)DM2-.  A1c is 5.3 reflecting excellent diabetic control PTA -hold glimepiride  Use Novolog/Humalog Sliding scale insulin with Accu-Cheks/Fingersticks as ordered   6)H/o Prior CVA--- hold Plavix in case patient needs surgical intervention for her bowel problems  7)CAD s/p stent placement, complete AV block status post pacemaker, severe aortic stenosis s/pTAVR----metoprolol and Plavix on hold as above   Status is: Inpatient   Disposition: The patient is from: Home              Anticipated d/c is  to: Home              Anticipated d/c date is: 2 days              Patient currently is not medically stable to d/c. Barriers: Not Clinically Stable-   Code Status :  -  Code Status: Full Code   Family Communication:    NA (patient is alert, awake and coherent)   DVT Prophylaxis  :   - SCDs  SCDs Start: 11/27/23 0102   Lab Results  Component Value Date   PLT 224 11/27/2023    Inpatient Medications  Scheduled Meds:  bisacodyl  10 mg Rectal Daily   insulin aspart  0-9 Units Subcutaneous Q4H   pantoprazole (PROTONIX) IV  40 mg Intravenous Q24H   Continuous Infusions:  sodium chloride 75 mL/hr at 11/27/23 1102   PRN Meds:.phenol, prochlorperazine   Anti-infectives (From admission, onward)    None       Subjective: [name] today has no fevers, no emesis,  No chest pain,   - Family at bedside -Had BM with Dulcolax   Objective: Vitals:   11/26/23 2047 11/26/23 2258 11/27/23 0404 11/27/23 1352  BP: (!) 132/48 (!) 147/79 134/61 (!) 160/60  Pulse: 61 89 81 75  Resp: 19 18 18 18   Temp: 98.1 F (36.7 C) 98.3 F (36.8 C) 98.5 F (36.9 C) 97.6 F (36.4 C)  TempSrc: Oral Oral  Axillary  SpO2: 98% 99% 97% 100%  Weight:      Height:        Intake/Output Summary (Last 24 hours) at 11/27/2023 1742 Last data filed at 11/27/2023  1353 Gross per 24 hour  Intake 718.75 ml  Output 400 ml  Net 318.75 ml   Filed Weights   11/26/23 1217  Weight: 73.5 kg    Physical Exam  Gen:- Awake Alert, in no acute distress HEENT:- [place].AT, No sclera icterus Nose- NG in situ Neck-Supple Neck,No JVD,.  Lungs-  CTAB , fair symmetrical air movement CV- S1, S2 normal, regular , 3/6 SM  Abd-  +ve B.Sounds, Abd Soft, No tenderness,    Extremity/Skin:- No  edema, pedal pulses present  Psych-affect is appropriate, oriented x3 Neuro-generalized weakness, no new focal deficits, no tremors  Data Reviewed: I have personally reviewed following labs and imaging  studies  CBC: Recent Labs  Lab 11/26/23 1230 11/27/23 0441  WBC 10.3 10.7*  NEUTROABS 8.4*  --   HGB 13.8 13.3  HCT 40.6 40.2  MCV 92.3 94.8  PLT 223 224   Basic Metabolic Panel: Recent Labs  Lab 11/26/23 1230 11/27/23 0441  NA 138 139  K 3.9 3.6  CL 100 102  CO2 25 29  GLUCOSE 157* 160*  BUN 21 18  CREATININE 0.98 1.12*  CALCIUM 9.7 8.7*  MG  --  2.1  PHOS  --  4.9*   GFR: Estimated Creatinine Clearance: 28.1 mL/min (A) (by C-G formula based on SCr of 1.12 mg/dL (H)). Liver Function Tests: Recent Labs  Lab 11/26/23 1230 11/27/23 0441  AST 33 31  ALT 26 28  ALKPHOS 68 68  BILITOT 1.1 1.3*  PROT 6.9 6.4*  ALBUMIN 3.7 3.3*   HbA1C: Recent Labs    11/26/23 1230  HGBA1C 5.8*   Recent Results (from the past 240 hours)  Resp panel by RT-PCR (RSV, Flu A&B, Covid) Anterior Nasal Swab     Status: None   Collection Time: 11/26/23 10:00 PM   Specimen: Anterior Nasal Swab  Result Value Ref Range Status   SARS Coronavirus 2 by RT PCR NEGATIVE NEGATIVE Final    Comment: (NOTE) SARS-CoV-2 target nucleic acids are NOT DETECTED.  The SARS-CoV-2 RNA is generally detectable in upper respiratory specimens during the acute phase of infection. The lowest concentration of SARS-CoV-2 viral copies this assay can detect is 138 copies/mL. A negative result does not preclude SARS-Cov-2 infection and should not be used as the sole basis for treatment or other patient management decisions. A negative result may occur with  improper specimen collection/handling, submission of specimen other than nasopharyngeal swab, presence of viral mutation(s) within the areas targeted by this assay, and inadequate number of viral copies(<138 copies/mL). A negative result must be combined with clinical observations, patient history, and epidemiological information. The expected result is Negative.  Fact Sheet for Patients:  BloggerCourse.com  Fact Sheet for  Healthcare Providers:  SeriousBroker.it  This test is no t yet approved or cleared by the [place] FDA and  has been authorized for detection and/or diagnosis of SARS-CoV-2 by FDA under an Emergency Use Authorization (EUA). This EUA will remain  in effect (meaning this test can be used) for the duration of the COVID-19 declaration under Section 564(b)(1) of the Act, 21 U.S.C.section 360bbb-3(b)(1), unless the authorization is terminated  or revoked sooner.       Influenza A by PCR NEGATIVE NEGATIVE Final   Influenza B by PCR NEGATIVE NEGATIVE Final    Comment: (NOTE) The Xpert Xpress SARS-CoV-2/FLU/RSV plus assay is intended as an aid in the diagnosis of influenza from Nasopharyngeal swab specimens and should not be used as a sole basis for  treatment. Nasal washings and aspirates are unacceptable for Xpert Xpress SARS-CoV-2/FLU/RSV testing.  Fact Sheet for Patients: BloggerCourse.com  Fact Sheet for Healthcare Providers: SeriousBroker.it  This test is not yet approved or cleared by the [place] FDA and has been authorized for detection and/or diagnosis of SARS-CoV-2 by FDA under an Emergency Use Authorization (EUA). This EUA will remain in effect (meaning this test can be used) for the duration of the COVID-19 declaration under Section 564(b)(1) of the Act, 21 U.S.C. section 360bbb-3(b)(1), unless the authorization is terminated or revoked.     Resp Syncytial Virus by PCR NEGATIVE NEGATIVE Final    Comment: (NOTE) Fact Sheet for Patients: BloggerCourse.com  Fact Sheet for Healthcare Providers: SeriousBroker.it  This test is not yet approved or cleared by the [place] FDA and has been authorized for detection and/or diagnosis of SARS-CoV-2 by FDA under an Emergency Use Authorization (EUA). This EUA will remain in effect (meaning this  test can be used) for the duration of the COVID-19 declaration under Section 564(b)(1) of the Act, 21 U.S.C. section 360bbb-3(b)(1), unless the authorization is terminated or revoked.  Performed at [facility], [address], [place], [place] [address]     Radiology Studies: DG Abdomen 1 View Result Date: 11/26/2023 CLINICAL DATA:  NG tube placement. EXAM: ABDOMEN - 1 VIEW COMPARISON:  CT earlier today FINDINGS: Tip and side port of the enteric tube below the diaphragm in the stomach. Dilated small bowel in the left upper abdomen. Excreted IV contrast in the renal collecting system from earlier CT. IMPRESSION: Tip and side port of the enteric tube below the diaphragm in the stomach. Electronically Signed   By: Narda Rutherford M.D.   On: 11/26/2023 22:30   DG Chest Portable 1 View Result Date: 11/26/2023 CLINICAL DATA:  Hypoxia.  Cough. EXAM: PORTABLE CHEST 1 VIEW COMPARISON:  05/18/2023 FINDINGS: Left-sided pacemaker in place. TAVR. Normal heart size with stable mediastinal contours. Aortic atherosclerosis. No focal airspace disease, pleural effusion, pulmonary edema or pneumothorax. No acute osseous findings. IMPRESSION: No acute findings. Electronically Signed   By: Narda Rutherford M.D.   On: 11/26/2023 22:29   CT ABDOMEN PELVIS W CONTRAST Result Date: 11/26/2023 CLINICAL DATA:  Nausea and vomiting. EXAM: CT ABDOMEN AND PELVIS WITH CONTRAST TECHNIQUE: Multidetector CT imaging of the abdomen and pelvis was performed using the standard protocol following bolus administration of intravenous contrast. RADIATION DOSE REDUCTION: This exam was performed according to the departmental dose-optimization program which includes automated exposure control, adjustment of the mA and/or kV according to patient size and/or use of iterative reconstruction technique. CONTRAST:  OMNIPAQUE IOHEXOL 300 MG/ML  SOLN COMPARISON:  CT abdomen and pelvis 05/18/2023 FINDINGS: Lower chest: Focal ground-glass nodular  density in the left lower lobe measures 10 x 7 mm. There is atelectasis in the right lung base. Hepatobiliary: A large gallstone is present measuring 4.2 cm. There is no biliary ductal dilatation. The liver is within normal limits. Pancreas: Unremarkable. No pancreatic ductal dilatation or surrounding inflammatory changes. Spleen: Normal in size without focal abnormality. Adrenals/Urinary Tract: Mild left renal atrophy present. No hydronephrosis or perinephric fluid. There are bilateral renal cysts measuring up to 2 cm. Adrenal glands and bladder are within normal limits. Stomach/Bowel: No free air. There is a short segment of small bowel wall thickening in the right lower quadrant. This is transition point for small bowel obstruction. Proximal small bowel is mildly dilated with air-fluid levels. The stomach is mildly dilated with air-fluid level. There is  wall thickening of the distal esophagus. The appendix is within normal limits. There is sigmoid and descending colon diverticulosis. Vascular/Lymphatic: Aortic atherosclerosis. No enlarged abdominal or pelvic lymph nodes. Reproductive: Uterus and bilateral adnexa are unremarkable. Other: No abdominal wall hernia or abnormality. No abdominopelvic ascites. Musculoskeletal: Multilevel degenerative changes affect the spine. Right hip arthroplasty is present. There are degenerative changes of the left hip. IMPRESSION: 1. Small-bowel obstruction with transition point in the right lower quadrant where there is a short segment of small bowel wall thickening. Findings may be related to infectious/inflammatory enteritis, however neoplasm is not excluded. 2. Cholelithiasis. 3. Colonic diverticulosis. 4. Wall thickening of the distal esophagus worrisome for esophagitis. 5. 10 x 7 mm ground-glass nodular density in the left lower lobe. Initial follow-up with CT at 6 months is recommended to confirm persistence. If persistent, repeat CT is recommended every 2 years until 5  years of stability has been established. This recommendation follows the consensus statement: Guidelines for Management of Incidental Pulmonary Nodules Detected on CT Images: From the Fleischner Society 2017; Radiology 2017; 284:228-243. 6. Bosniak I benign renal cyst measuring 2 cm. No follow-up imaging is recommended. JACR 2018 Feb; 264-273, Management of the Incidental Renal Mass on CT, Radio[employer] 2021; 814-848, Bosniak Classification of Cystic Renal Masses, Version 2019. Aortic Atherosclerosis (ICD10-I70.0). Electronically Signed   By: Darliss Cheney M.D.   On: 11/26/2023 20:40   US Abdomen Limited RUQ (LIVER/GB) Result Date: 11/26/2023 CLINICAL DATA:  Severe nausea and vomiting since last night. EXAM: ULTRASOUND ABDOMEN LIMITED RIGHT UPPER QUADRANT COMPARISON:  CT abdomen pelvis 05/18/2023. FINDINGS: Gallbladder: Shadowing echogenic stones measure up to 1.4 cm and do not appear mobile. No wall thickening. Negative sonographic Murphy sign. Common bile duct: Diameter: 6 mm, within normal limits for age. No intrahepatic biliary ductal dilatation. Liver: No focal lesion identified. Within normal limits in parenchymal echogenicity. Portal vein is patent on color Doppler imaging with normal direction of blood flow towards the liver. Other: Incidental imaging of the right kidney shows fullness of the right renal pelvis which likely corresponds to a prominent extrarenal pelvis as on 05/18/2023. IMPRESSION: Cholelithiasis.  No acute findings. Electronically Signed   By: Leanna Battles M.D.   On: 11/26/2023 16:10   Scheduled Meds:  bisacodyl  10 mg Rectal Daily   insulin aspart  0-9 Units Subcutaneous Q4H   pantoprazole (PROTONIX) IV  40 mg Intravenous Q24H   Continuous Infusions:  sodium chloride 75 mL/hr at 11/27/23 1102    LOS: 1 day   Shon Hale M.D on 11/27/2023 at 5:42 PM  Go to [email or web address] - for contact info  [facility] - Office  [phone]  If 7PM-7AM, please contact  night-coverage [email or web address] 11/27/2023, 5:42 PM

## 2023-11-27 NOTE — Consult Note (Signed)
 @LOGO @   Referring Provider: [facility]  Primary Care Physician:  Lemar Lofty, DO Primary Gastroenterologist:  Dr. Jena Gauss  Date of Admission: 11/26/23 Date of Consultation: 11/27/23  Reason for Consultation:  SBO, small bowel wall thickening   HPI:  Denise Parrish is a 89 y.o. year old female  with medical history of diabetes, COPD, CVA, mild cognitive impairment, HTN, carotid artery disease, severe aortic stenosis s/p TAVR, coronary artery disease s/p stent placement in December 2021 on Plavix, paroxysmal AV block s/p dual-chamber pacemaker, GERD, prior history of gastrointestinal bleeding in 2013, hospitalized in March 2022 with symptomatic anemia and melena s/p  EGD, colonoscopy, and capsule study.  EGD with findings suggestive of short segment Barrett's.  Colonoscopy with no active bleeding, two 2-4 mm polyps not removed as she was on Plavix.  Capsule study with questionable nodule/small mass in the distal small bowel but without ulceration.  Evidence of hematochezia throughout the colon but no fresh blood.  Queried Dieulafoy lesion as culprit for obscure GI bleeding vs small bowel nodule. CTA completed without source for bleeding. There was consideration of repeat colonoscopy to determine if small bowel nodule could be reached. Additionally, if persistent clinical bleeding, would need to consider retrograde double-balloon enteroscopy.  However, as hemoglobin remained stable and was improving, no additional procedure were pursued and patient opted to forgo further evaluation. Also with history of cholecystitis in 2023 s/p percutaneous drainage for 15 months.   She is now admitted to the hospital after presenting on 3/10 with nausea and vomiting which started the night prior.   ED Course:  In the emergency department, she was hemodynamically stable.  Workup in the ED showed normal CBC, BMP except for blood glucose of 157.  Urinalysis was normal.  Influenza A, B, SARS coronavirus 2,  RSV was negative. CT abdomen and pelvis with contrast showed small bowel obstruction with transition point in the right lower quadrant where there is a short segment of small bowel wall thickening. Findings may be related to infectious/inflammatory enteritis, however neoplasm is not excluded. Wall thickening of the distal esophagus worrisome for esophagitis. Patient was treated with Zofran and Phenergan.  IV hydration was provided, Reglan was also given.  NGT was placed. General surgery (Dr. Robyne Peers) was consulted who also recommended GI input.   Today:  Reports acute onset nausea and vomiting starting Sunday. Denies associated abdominal pain. Came to the ER because she was concerned she would get dehydrated. No diarrhea. Denies any chronic issues with nausea/vomiting. Had been doing well with good appetite, no weight loss, bowels have been moving normally with a stool softener about once a week. No brbpr or melena. Reports no BM yesterday or today. Noticed decreased flatus yesterday, but has passed quite a bit of gas this morning. Denies abdominal distension.   Overall feeling much improved with NG tube. Last vomited prior to NG tube placement.   At 73, she lives by herself and continues to do her own house cleaning, laundry, taking care of herself.  She does not cook as she is legally blind.  Her family does this for her.   Past Medical History:  Diagnosis Date   Anxiety    CAD (coronary artery disease)    a. DES placement to the LCx in 08/2020 and medical management was recommended of her residual disease   Carotid stenosis, right 10/16/2019   75% on CTA neck   Cholelithiasis    on u/s 09/2011   Degenerative joint disease  Right THA in 1995; cervical discectomy and fusion-2001   Diabetes mellitus, type 2 (HCC)    + neuropathy   Emphysema    Gastroesophageal reflux disease    Hiatal hernia    History of CVA (cerebrovascular accident)    Hyperlipidemia    Hypertension    IDA  (iron deficiency anemia)    labs 09/2011   Mild cognitive impairment    Obesity    Osteoporosis    Pneumonia    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   s/p TAVR with a 23mm Medtronic Evolut Pro+ via the TF approach    Severe aortic stenosis    Sigmoid diverticulosis    Small bowel lesion    On Given's capsule; Dr Sharin Mons 07/2012, no further w/u needed    Past Surgical History:  Procedure Laterality Date   ANTERIOR CERVICAL DISCECTOMY  2001   C4-5, allograft, fixation   BREAST LUMPECTOMY     benign   CATARACT EXTRACTION W/ INTRAOCULAR LENS IMPLANT  2007   Left   COLONOSCOPY  2006   COLONOSCOPY  04/03/2012   Dr. Merlyn Lot diverticulosis, negative microscopic  colitis    COLONOSCOPY WITH PROPOFOL N/A 11/27/2020   Surgeon: Dolores Frame, MD;  diverticulosis, no active bleeding.  Two 2-4 mm polyps not removed in the setting of Plavix.   CORONARY STENT INTERVENTION N/A 09/08/2020   Procedure: CORONARY STENT INTERVENTION;  Surgeon: Tonny Bollman, MD;  Location: [facility] INVASIVE CV LAB;  Service: Cardiovascular;  Laterality: N/A;   ENDARTERECTOMY Right 10/21/2019   Procedure: ENDARTERECTOMY CAROTID RIGHT;  Surgeon: Maeola Harman, MD;  Location: [facility] OR;  Service: Vascular;  Laterality: Right;   ESOPHAGOGASTRODUODENOSCOPY  04/03/2012   Dr. Dot Been hernia, chronic gastritis on bx   ESOPHAGOGASTRODUODENOSCOPY (EGD) WITH PROPOFOL N/A 04/28/2020   Procedure: ESOPHAGOGASTRODUODENOSCOPY (EGD) WITH PROPOFOL;  Surgeon: Kathi Der, MD;  Location: [facility] ENDOSCOPY;  Service: Gastroenterology;  Laterality: N/A;   ESOPHAGOGASTRODUODENOSCOPY (EGD) WITH PROPOFOL N/A 11/27/2020   Surgeon: Marguerita Merles, Reuel Boom, MD; findings suggestive of short segment Barrett's, normal stomach, and small bowel.   FEMORAL ARTERY EXPLORATION Right 09/28/2020   Procedure: RIGHT GROIN EXPLORATION WITH REPAIR RIGHT COMMON FEMORAL ARTERY;  Surgeon: Larina Earthly, MD;  Location: [facility] OR;  Service: Vascular;  Laterality: Right;   GIVENS CAPSULE STUDY  05/09/2012   RMR: an unclear raised area of small bowel was noted, with features almost  characteristic of very small polyp. This was without villous  blunting or any evidence of active bleeding; yet, the area of  concern appeared to be erythematous. However, this could simply  be a light reflection on a normal variation of the small bowel. REFERRED TO DR. GILLIAM, APPT FOR NOVEMBER 2013.    GIVENS CAPSULE STUDY N/A 04/30/2020   Procedure: GIVENS CAPSULE STUDY;  Surgeon: Kathi Der, MD;  Location: [facility] ENDOSCOPY;  Service: Gastroenterology;  Laterality: N/A;   GIVENS CAPSULE STUDY  11/27/2020    Surgeon: Marguerita Merles, Reuel Boom, MD;  questionable nodule/small mass in the distal small bowel but without ulceration.  Evidence of hematochezia throughout the colon but no fresh blood.    HIP ARTHROPLASTY Right    INSERT / REPLACE / REMOVE PACEMAKER     IR CHOLANGIOGRAM EXISTING TUBE  07/13/2022   IR REMOVAL BILIARY DRAIN  08/09/2022   PACEMAKER IMPLANT N/A 07/12/2021   Procedure: PACEMAKER IMPLANT;  Surgeon: Lanier Prude, MD;  Location: [facility] INVASIVE CV LAB;  Service: Cardiovascular;  Laterality: N/A;   PATCH  ANGIOPLASTY Right 10/21/2019   Procedure: PATCH ANGIOPLASTY USING Livia Snellen BIOLOGIC PATCH;  Surgeon: Maeola Harman, MD;  Location: [facility] OR;  Service: Vascular;  Laterality: Right;   POLYPECTOMY  04/28/2020   Procedure: POLYPECTOMY;  Surgeon: Kathi Der, MD;  Location: [facility] ENDOSCOPY;  Service: Gastroenterology;;   RIGHT/LEFT HEART CATH AND CORONARY ANGIOGRAPHY N/A 09/08/2020   Procedure: RIGHT/LEFT HEART CATH AND CORONARY ANGIOGRAPHY;  Surgeon: Tonny Bollman, MD;  Location: [facility] INVASIVE CV LAB;  Service: Cardiovascular;  Laterality: N/A;   TEE WITHOUT CARDIOVERSION N/A 09/28/2020   Procedure: TRANSESOPHAGEAL ECHOCARDIOGRAM (TEE);  Surgeon: Tonny Bollman, MD;  Location: [facility] OR;   Service: Open Heart Surgery;  Laterality: N/A;   TONSILLECTOMY     TOTAL HIP ARTHROPLASTY  1995   Right   TRANSCATHETER AORTIC VALVE REPLACEMENT, TRANSFEMORAL N/A 09/28/2020   Procedure: TRANSCATHETER AORTIC VALVE REPLACEMENT, TRANSFEMORAL;  Surgeon: Tonny Bollman, MD;  Location: [facility] OR;  Service: Open Heart Surgery;  Laterality: N/A;    Prior to Admission medications   Medication Sig Start Date End Date Taking? Authorizing Provider  acetaminophen (TYLENOL) 500 MG tablet Take 500 mg by mouth every 8 (eight) hours as needed for mild pain.   Yes [provider]  ALPRAZolam Prudy Feeler) 0.5 MG tablet Take 0.5 mg by mouth at bedtime.   Yes [provider]  Cholecalciferol (VITAMIN D3 PO) Take 1 tablet by mouth daily.   Yes [provider]  clopidogrel (PLAVIX) 75 MG tablet TAKE ONE (1) TABLET BY MOUTH EVERY DAY WITH BREAKFAST 08/22/23  Yes Lanier Prude, MD  FEROSUL 325 (65 Fe) MG tablet TAKE ONE TABLET (325MG  TOTAL) BY MOUTH DAILY 04/10/23  Yes Clearance Coots, Bracken Moffa S, PA-C  glimepiride (AMARYL) 1 MG tablet Take 1 mg by mouth daily with breakfast.   Yes [provider]  meclizine (ANTIVERT) 12.5 MG tablet Take 1 tablet (12.5 mg total) by mouth 3 (three) times daily as needed for dizziness. 12/14/19  Yes Raeford Razor, MD  metoprolol tartrate (LOPRESSOR) 25 MG tablet Take 0.5 tablets (12.5 mg total) by mouth 2 (two) times daily. 05/07/20  Yes Mikhail, Nita Sells, DO  Multiple Vitamin (MULTIVITAMIN) tablet Take 1 tablet by mouth daily.   Yes [provider]  Multiple Vitamins-Minerals (PRESERVISION AREDS 2 PO) Take 1 tablet by mouth daily. Takes 1 tablet in the morning and 1 tablet at night for eyes.   Yes [provider]  pantoprazole (PROTONIX) 40 MG tablet TAKE ONE TABLET (40 MG TOTAL) BY MOUTH ONCE DAILY. 08/22/23  Yes Lanier Prude, MD  vitamin C (ASCORBIC ACID) 500 MG tablet Take 500 mg by mouth daily.   Yes [provider]  zinc sulfate,  50mg  elemental zinc, 220 (50 Zn) MG capsule Take 220 mg by mouth daily.   Yes [provider]    Current Facility-Administered Medications  Medication Dose Route Frequency Provider Last Rate Last Admin   0.9 %  sodium chloride infusion   Intravenous Continuous Adefeso, Oladapo, DO 75 mL/hr at 11/26/23 2125 New Bag at 11/26/23 2125   bisacodyl (DULCOLAX) suppository 10 mg  10 mg Rectal Daily Pappayliou, Catherine A, DO       insulin aspart (novoLOG) injection 0-9 Units  0-9 Units Subcutaneous Q4H Adefeso, Oladapo, DO   1 Units at 11/27/23 0922   pantoprazole (PROTONIX) injection 40 mg  40 mg Intravenous Q24H Adefeso, Oladapo, DO   40 mg at 11/27/23 0258   prochlorperazine (COMPAZINE) injection 10 mg  10 mg Intravenous Q6H PRN Frankey Shown,  DO        Allergies as of 11/26/2023   (No Known Allergies)    Family History  Problem Relation Age of Onset   Cirrhosis Mother        no etoh use   CAD Father    Diabetes Mother        + brother   Heart failure Father    Hypertension Father        + brother   Cancer Brother        lung, age 69   Uterine cancer Sister    Colon cancer Neg Hx     Social History   Socioeconomic History   Marital status: Divorced    Spouse name: Not on file   Number of children: 4   Years of education: 6th grade   Highest education level: Not on file  Occupational History   Occupation: Retired    Comment: [employer]  Tobacco Use   Smoking status: Never    Passive exposure: Never   Smokeless tobacco: Never   Tobacco comments:    Quit x 50 years  Vaping Use   Vaping status: Never Used  Substance and Sexual Activity   Alcohol use: Never   Drug use: Never   Sexual activity: Not Currently  Other Topics Concern   Not on file  Social History Narrative   Lives alone. Every other week she has family with her. Daughter and son take care of her during the days.    Right-handed.   No daily use of caffeine.             Social Drivers of Manufacturing engineer Strain: Not on file  Food Insecurity: No Food Insecurity (11/26/2023)   Hunger Vital Sign    Worried About Running Out of Food in the Last Year: Never true    Ran Out of Food in the Last Year: Never true  Transportation Needs: No Transportation Needs (11/26/2023)   PRAPARE - Administrator, Civil Service (Medical): No    Lack of Transportation (Non-Medical): No  Physical Activity: Not on file  Stress: Not on file  Social Connections: Moderately Isolated (11/26/2023)   Social Connection and Isolation Panel [NHANES]    Frequency of Communication with Friends and Family: More than three times a week    Frequency of Social Gatherings with Friends and Family: More than three times a week    Attends Religious Services: More than 4 times per year    Active Member of [employer] or Organizations: No    Attends Banker Meetings: Never    Marital Status: Widowed  Intimate Partner Violence: Not At Risk (11/26/2023)   Humiliation, Afraid, Rape, and Kick questionnaire    Fear of Current or Ex-Partner: No    Emotionally Abused: No    Physically Abused: No    Sexually Abused: No    Review of Systems: Gen: Denies fever, chills, cold ro flu like symptoms, pre-syncope, or syncope.  CV: Denies chest pain, heart palpitations. Resp: Denies shortness of breath, cough . GI:: See HPI GU : Denies urinary burning, urinary frequency, urinary incontinence.  MS: Denies joint pain. Derm: Denies rash. Psych: Denies depression, anxiety. Heme: See HPI  Physical Exam: Vital signs in last 24 hours: Temp:  [98.1 F (36.7 C)-98.8 F (37.1 C)] 98.5 F (36.9 C) (03/11 0404) Pulse Rate:  [61-90] 81 (03/11 0404) Resp:  [12-23] 18 (03/11 0404) BP: (111-166)/(48-116) 134/61 (03/11 0404) SpO2:  [  70 %-100 %] 97 % (03/11 0404) Weight:  [73.5 kg] 73.5 kg (03/10 1217)   General:   Alert,  Well-developed, well-nourished, pleasant and cooperative in NAD Head:  Normocephalic and  atraumatic. Eyes:  Sclera clear, no icterus.   Conjunctiva pink. Ears:  Decreased auditory acuity. Nose:  No deformity, discharge,  or lesions. Lungs:  Clear throughout to auscultation.   No wheezes, crackles, or rhonchi. No acute distress. Heart:  Regular rate and rhythm; no murmurs, clicks, rubs,  or gallops. Abdomen:  Soft, full, nontender. No masses or hernias noted. Normal bowel sounds, without guarding, and without rebound.   Rectal:  Deferred. Msk:  Symmetrical without gross deformities. Normal posture. Pulses:  Normal pulses noted. Extremities:  Without edema. Neurologic:  Alert and  oriented x4;  grossly normal neurologically. Skin:  Intact without significant lesions or rashes. Cervical Nodes:  No significant cervical adenopathy. Psych: Normal mood and affect.  Intake/Output from previous day: 03/10 0701 - 03/11 0700 In: 568.8 [I.V.:568.8] Out: 400 [Urine:100; Emesis/NG output:300] Intake/Output this shift: Total I/O In: 50 [P.O.:50] Out: -   Lab Results: Recent Labs    11/26/23 1230 11/27/23 0441  WBC 10.3 10.7*  HGB 13.8 13.3  HCT 40.6 40.2  PLT 223 224   BMET Recent Labs    11/26/23 1230 11/27/23 0441  NA 138 139  K 3.9 3.6  CL 100 102  CO2 25 29  GLUCOSE 157* 160*  BUN 21 18  CREATININE 0.98 1.12*  CALCIUM 9.7 8.7*   LFT Recent Labs    11/26/23 1230 11/27/23 0441  PROT 6.9 6.4*  ALBUMIN 3.7 3.3*  AST 33 31  ALT 26 28  ALKPHOS 68 68  BILITOT 1.1 1.3*   PT/INR No results for input(s): "LABPROT", "INR" in the last 72 hours. Hepatitis Panel No results for input(s): "HEPBSAG", "HCVAB", "HEPAIGM", "HEPBIGM" in the last 72 hours. C-Diff No results for input(s): "CDIFFTOX" in the last 72 hours.  Studies/Results: DG Abdomen 1 View Result Date: 11/26/2023 CLINICAL DATA:  NG tube placement. EXAM: ABDOMEN - 1 VIEW COMPARISON:  CT earlier today FINDINGS: Tip and side port of the enteric tube below the diaphragm in the stomach. Dilated small bowel  in the left upper abdomen. Excreted IV contrast in the renal collecting system from earlier CT. IMPRESSION: Tip and side port of the enteric tube below the diaphragm in the stomach. Electronically Signed   By: Narda Rutherford M.D.   On: 11/26/2023 22:30   DG Chest Portable 1 View Result Date: 11/26/2023 CLINICAL DATA:  Hypoxia.  Cough. EXAM: PORTABLE CHEST 1 VIEW COMPARISON:  05/18/2023 FINDINGS: Left-sided pacemaker in place. TAVR. Normal heart size with stable mediastinal contours. Aortic atherosclerosis. No focal airspace disease, pleural effusion, pulmonary edema or pneumothorax. No acute osseous findings. IMPRESSION: No acute findings. Electronically Signed   By: Narda Rutherford M.D.   On: 11/26/2023 22:29   CT ABDOMEN PELVIS W CONTRAST Result Date: 11/26/2023 CLINICAL DATA:  Nausea and vomiting. EXAM: CT ABDOMEN AND PELVIS WITH CONTRAST TECHNIQUE: Multidetector CT imaging of the abdomen and pelvis was performed using the standard protocol following bolus administration of intravenous contrast. RADIATION DOSE REDUCTION: This exam was performed according to the departmental dose-optimization program which includes automated exposure control, adjustment of the mA and/or kV according to patient size and/or use of iterative reconstruction technique. CONTRAST:  OMNIPAQUE IOHEXOL 300 MG/ML  SOLN COMPARISON:  CT abdomen and pelvis 05/18/2023 FINDINGS: Lower chest: Focal ground-glass nodular density in the left lower  lobe measures 10 x 7 mm. There is atelectasis in the right lung base. Hepatobiliary: A large gallstone is present measuring 4.2 cm. There is no biliary ductal dilatation. The liver is within normal limits. Pancreas: Unremarkable. No pancreatic ductal dilatation or surrounding inflammatory changes. Spleen: Normal in size without focal abnormality. Adrenals/Urinary Tract: Mild left renal atrophy present. No hydronephrosis or perinephric fluid. There are bilateral renal cysts measuring up to 2  cm. Adrenal glands and bladder are within normal limits. Stomach/Bowel: No free air. There is a short segment of small bowel wall thickening in the right lower quadrant. This is transition point for small bowel obstruction. Proximal small bowel is mildly dilated with air-fluid levels. The stomach is mildly dilated with air-fluid level. There is wall thickening of the distal esophagus. The appendix is within normal limits. There is sigmoid and descending colon diverticulosis. Vascular/Lymphatic: Aortic atherosclerosis. No enlarged abdominal or pelvic lymph nodes. Reproductive: Uterus and bilateral adnexa are unremarkable. Other: No abdominal wall hernia or abnormality. No abdominopelvic ascites. Musculoskeletal: Multilevel degenerative changes affect the spine. Right hip arthroplasty is present. There are degenerative changes of the left hip. IMPRESSION: 1. Small-bowel obstruction with transition point in the right lower quadrant where there is a short segment of small bowel wall thickening. Findings may be related to infectious/inflammatory enteritis, however neoplasm is not excluded. 2. Cholelithiasis. 3. Colonic diverticulosis. 4. Wall thickening of the distal esophagus worrisome for esophagitis. 5. 10 x 7 mm ground-glass nodular density in the left lower lobe. Initial follow-up with CT at 6 months is recommended to confirm persistence. If persistent, repeat CT is recommended every 2 years until 5 years of stability has been established. This recommendation follows the consensus statement: Guidelines for Management of Incidental Pulmonary Nodules Detected on CT Images: From the Fleischner Society 2017; Radiology 2017; 284:228-243. 6. Bosniak I benign renal cyst measuring 2 cm. No follow-up imaging is recommended. JACR 2018 Feb; 264-273, Management of the Incidental Renal Mass on CT, [employer] 2021; 814-848, Bosniak Classification of Cystic Renal Masses, Version 2019. Aortic Atherosclerosis (ICD10-I70.0).  Electronically Signed   By: Darliss Cheney M.D.   On: 11/26/2023 20:40   US Abdomen Limited RUQ (LIVER/GB) Result Date: 11/26/2023 CLINICAL DATA:  Severe nausea and vomiting since last night. EXAM: ULTRASOUND ABDOMEN LIMITED RIGHT UPPER QUADRANT COMPARISON:  CT abdomen pelvis 05/18/2023. FINDINGS: Gallbladder: Shadowing echogenic stones measure up to 1.4 cm and do not appear mobile. No wall thickening. Negative sonographic Murphy sign. Common bile duct: Diameter: 6 mm, within normal limits for age. No intrahepatic biliary ductal dilatation. Liver: No focal lesion identified. Within normal limits in parenchymal echogenicity. Portal vein is patent on color Doppler imaging with normal direction of blood flow towards the liver. Other: Incidental imaging of the right kidney shows fullness of the right renal pelvis which likely corresponds to a prominent extrarenal pelvis as on 05/18/2023. IMPRESSION: Cholelithiasis.  No acute findings. Electronically Signed   By: Leanna Battles M.D.   On: 11/26/2023 16:10    Impression: 89 year old female with history of diabetes, COPD, CVA, mild cognitive impairment, HTN, carotid artery disease, severe aortic stenosis s/p TAVR, coronary artery disease s/p stent placement in December 2021 on Plavix, paroxysmal AV block s/p dual-chamber pacemaker, GERD, prior GI bleeding in 2013, symptomatic anemia with melena in March 2022 s/p EGD, colonoscopy, capsule study which did not identify source of bleeding, query Dula for lesion.  She was found to have questionable nodule/small mass in the distal small bowel.  Had discussed repeat colonoscopy  to determine if small bowel nodule could be reached, but ultimately patient preferred to hold off on this as her hemoglobin was stable/improving.  She is now admitted with SBO after presenting with acute onset nausea/vomiting.  SBO: CT A/P with contrast on 3/10 showed small bowel obstruction with transition point in the right lower quadrant where  there is a short segment of small bowel wall thickening concerning for infectious/laboratory enteritis, but neoplasm not excluded.  Clinically, patient has improved significantly with NG tube placement.  No further nausea/vomiting and is passing flatus this morning, but has not had a bowel movement.  General surgery is on board and has ordered Dulcolax suppository. Will defer further management to GS.  Regarding etiology of SBO/small bowel wall thickening, she does not have any symptoms of enteritis such as diarrhea, so I do not think a course of antibiotics is needed at this time.  There was questionable small bowel nodule/mass in the distal small bowel on givens capsule in 2022, so would recommend enterography in the outpatient setting once she recovers from obstruction.  Depending on findings, can consider repeating a capsule with patency capsule prior.   Esophageal wall thickening: Noted on CT.  No significant GERD symptoms outpatient.  Likely related to repetitive vomiting. Will continue daily PPI.     Plan: Agree with Dulcolax suppository as ordered by general surgery. Diet advancement per general surgery. Once recovered from SBO, recommend outpatient small bowel enterography and can consider capsule endoscopy with patency capsule prior depending on findings. Continue daily PPI.   LOS: 1 day    11/27/2023, 9:31 AM   Ermalinda Memos, PA-C [facility]

## 2023-11-27 NOTE — Consult Note (Signed)
 Consultation Note Date: 11/27/2023   Patient Name: Denise Parrish  DOB: 24-Nov-1922  MRN: [id number]  Age / Sex: 89 y.o., female  PCP: Lemar Lofty, DO Referring Physician: Shon Hale, MD  Reason for Consultation: Establishing goals of care  HPI/Patient Profile: 89 y.o. female  with past medical history of HTN/HLD, GERD, CAD s/p stent, AV blck sp pacer, severe aortic stens s/pTAVR admitted on 11/26/2023 with SBO.   Clinical Assessment and Goals of Care: I have reviewed medical records including EPIC notes, labs and imaging, received report from RN, assessed the patient.  Denise Parrish is sitting up in the [place] chair in her room.  She appears elderly, but not overly frail.  She greets me, making and mostly keeping eye contact.  She is alert and oriented, able to make her needs known.  Her daughter, Carney Bern, is present at bedside.  GI nurse practitioner is also present.  We meet at the bedside to discuss diagnosis prognosis, GOC, EOL wishes, disposition and options. I introduced Palliative Medicine as specialized medical care for people living with serious illness. It focuses on providing relief from the symptoms and stress of a serious illness. The goal is to improve quality of life for both the patient and the family.  We discussed a brief life review of the patient.  Denise Parrish lives independently.  She is able to manage her ADLs and some of her IADLs.  She shares that her children cook for her and bring food.  She tells me that she has had 1 goal in life, the "only thing she focuses on" which is to live to 89 years of age.  We focused on their current illness.  We talked about [name] acute health concerns, her abdomen.  She talks about NG tube, distended abdomen, surgery and GI consult.  We talked about time for outcomes.  The natural disease trajectory and expectations at EOL were  discussed.  Advanced directives, concepts specific to code status, artifical feeding and hydration, and rehospitalization were not discussed today secondary to relationship building and multiple services visiting patient and daughter today.  Discussed the importance of continued conversation with family and the medical providers regarding overall plan of care and treatment options, ensuring decisions are within the context of the patient's values and GOCs.  Questions and concerns were addressed.  Hard Choices booklet left for review. The family was encouraged to call with questions or concerns.  PMT will continue to support holistically.  Conference with attending, GI service, bedside nursing staff, transition of care team related to patient condition, needs, goals of care, disposition.   HCPOA  NEXT OF KIN    SUMMARY OF RECOMMENDATIONS   Continue to treat the treatable.   Time for outcomes. Goal is to live to 89 years of age Anticipate return home when able Follow-up GI outpatient   Code Status/Advance Care Planning: Full code  Symptom Management:  Per hospitalist, no additional needs at this time.  Palliative Prophylaxis:  Frequent Pain Assessment and Oral Care  Additional Recommendations (Limitations, Scope, Preferences): Full Scope Treatment  Psycho-social/Spiritual:  Desire for further Chaplaincy support:no Additional Recommendations: Caregiving  Support/Resources  Prognosis:  Unable to determine, based on outcomes.  Discharge Planning: Goal is to return home      Primary Diagnoses: Present on Admission:  Small bowel obstruction (HCC)  Gastroesophageal reflux disease  Essential hypertension   I have reviewed the medical record, interviewed the patient and family, and examined the patient. The following aspects are pertinent.  Past Medical History:  Diagnosis Date   Anxiety    CAD (coronary artery disease)    a. 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   s/p TAVR with a 23mm Medtronic Evolut Pro+ via the TF approach    Severe aortic stenosis    Sigmoid diverticulosis    Small bowel lesion    On Given's capsule; Dr Sharin Mons 07/2012, no further w/u needed   Social History   Socioeconomic History   Marital status: Divorced    Spouse name: Not on file   Number of children: 4   Years of education: 6th grade   Highest education level: Not on file  Occupational History   Occupation: Retired    Comment: [employer]  Tobacco Use   Smoking status: Never    Passive exposure: Never   Smokeless tobacco: Never   Tobacco comments:    Quit x 50 years  Vaping Use   Vaping status: Never Used  Substance and Sexual Activity   Alcohol use: Never   Drug use: Never   Sexual activity: Not Currently  Other Topics Concern   Not on file  Social History Narrative   Lives alone. Every other week she has family with her. Daughter and son take care of her during the days.    Right-handed.   No daily use of caffeine.             Social Drivers of Corporate investment banker Strain: Not on file  Food Insecurity: No Food Insecurity (11/26/2023)   Hunger Vital Sign    Worried About Running Out of Food in the Last Year: Never true    Ran Out of Food in the Last Year: Never true  Transportation Needs: No Transportation Needs (11/26/2023)    PRAPARE - Administrator, Civil Service (Medical): No    Lack of Transportation (Non-Medical): No  Physical Activity: Not on file  Stress: Not on file  Social Connections: Moderately Isolated (11/26/2023)   Social Connection and Isolation Panel [NHANES]    Frequency of Communication with Friends and Family: More than three times a week    Frequency of Social Gatherings with Friends and Family: More than three times a week    Attends Religious Services:  More than 4 times per year    Active Member of Clubs or Organizations: No    Attends Banker Meetings: Never    Marital Status: Widowed   Family History  Problem Relation Age of Onset   Cirrhosis Mother        no etoh use   CAD Father    Diabetes Mother        + brother   Heart failure Father    Hypertension Father        + brother   Cancer Brother        lung, age 13   Uterine cancer Sister    Colon cancer Neg Hx    Scheduled Meds:  bisacodyl  10 mg Rectal Daily   insulin aspart  0-9 Units Subcutaneous Q4H   pantoprazole (PROTONIX) IV  40 mg Intravenous Q24H   Continuous Infusions:  sodium chloride 75 mL/hr at 11/26/23 2125   PRN Meds:.prochlorperazine Medications Prior to Admission:  Prior to Admission medications   Medication Sig Start Date End Date Taking? Authorizing Provider  acetaminophen (TYLENOL) 500 MG tablet Take 500 mg by mouth every 8 (eight) hours as needed for mild pain.   Yes [provider]  ALPRAZolam Prudy Feeler) 0.5 MG tablet Take 0.5 mg by mouth at bedtime.   Yes [provider]  Cholecalciferol (VITAMIN D3 PO) Take 1 tablet by mouth daily.   Yes [provider]  clopidogrel (PLAVIX) 75 MG tablet TAKE ONE (1) TABLET BY MOUTH EVERY DAY WITH BREAKFAST 08/22/23  Yes Lanier Prude, MD  FEROSUL 325 (65 Fe) MG tablet TAKE ONE TABLET (325MG  TOTAL) BY MOUTH DAILY 04/10/23  Yes Clearance Coots, Kristen S, PA-C  glimepiride (AMARYL) 1 MG tablet Take 1 mg by mouth daily with  breakfast.   Yes [provider]  meclizine (ANTIVERT) 12.5 MG tablet Take 1 tablet (12.5 mg total) by mouth 3 (three) times daily as needed for dizziness. 12/14/19  Yes Raeford Razor, MD  metoprolol tartrate (LOPRESSOR) 25 MG tablet Take 0.5 tablets (12.5 mg total) by mouth 2 (two) times daily. 05/07/20  Yes Mikhail, Nita Sells, DO  Multiple Vitamin (MULTIVITAMIN) tablet Take 1 tablet by mouth daily.   Yes [provider]  Multiple Vitamins-Minerals (PRESERVISION AREDS 2 PO) Take 1 tablet by mouth daily. Takes 1 tablet in the morning and 1 tablet at night for eyes.   Yes [provider]  pantoprazole (PROTONIX) 40 MG tablet TAKE ONE TABLET (40 MG TOTAL) BY MOUTH ONCE DAILY. 08/22/23  Yes Lanier Prude, MD  vitamin C (ASCORBIC ACID) 500 MG tablet Take 500 mg by mouth daily.   Yes [provider]  zinc sulfate, 50mg  elemental zinc, 220 (50 Zn) MG capsule Take 220 mg by mouth daily.   Yes [provider]   No Known Allergies Review of Systems  Unable to perform ROS: Age    Physical Exam Vitals and nursing note reviewed.  Constitutional:      General: She is not in acute distress.    Appearance: She is not ill-appearing.  HENT:     Mouth/Throat:     Mouth: Mucous membranes are moist.  Cardiovascular:     Rate and Rhythm: Normal rate.  Pulmonary:     Effort: Pulmonary effort is normal. No respiratory distress.  Abdominal:     General: There is distension.     Palpations: Abdomen is soft.     Tenderness: There is no guarding.  Skin:  General: Skin is warm and dry.  Neurological:     Mental Status: She is alert and oriented to person, place, and time.  Psychiatric:        Mood and Affect: Mood normal.        Behavior: Behavior normal.     Vital Signs: BP 134/61 (BP Location: Left Arm)   Pulse 81   Temp 98.5 F (36.9 C)   Resp 18   Ht 5\' 6"  (1.676 m)   Wt 73.5 kg   SpO2 97%   BMI 26.15 kg/m  Pain Scale: 0-10   Pain Score:  0-No pain   SpO2: SpO2: 97 % O2 Device:SpO2: 97 % O2 Flow Rate: .O2 Flow Rate (L/min): 2 L/min  IO: Intake/output summary:  Intake/Output Summary (Last 24 hours) at 11/27/2023 0940 Last data filed at 11/27/2023 0916 Gross per 24 hour  Intake 618.75 ml  Output 400 ml  Net 218.75 ml    LBM:   Baseline Weight: Weight: 73.5 kg Most recent weight: Weight: 73.5 kg     Palliative Assessment/Data:     Time In: 0800  Time Out: 0840  Time Total: 40 minutes  Greater than 50%  of this time was spent counseling and coordinating care related to the above assessment and plan.  Signed by: Katheran Awe, NP   Please contact Palliative Medicine Team phone at [phone] for questions and concerns.  For individual provider: See Loretha Stapler

## 2023-11-28 ENCOUNTER — Inpatient Hospital Stay (HOSPITAL_COMMUNITY)

## 2023-11-28 DIAGNOSIS — Z7189 Other specified counseling: Secondary | ICD-10-CM

## 2023-11-28 DIAGNOSIS — K56609 Unspecified intestinal obstruction, unspecified as to partial versus complete obstruction: Secondary | ICD-10-CM | POA: Diagnosis not present

## 2023-11-28 DIAGNOSIS — Z515 Encounter for palliative care: Secondary | ICD-10-CM

## 2023-11-28 DIAGNOSIS — R933 Abnormal findings on diagnostic imaging of other parts of digestive tract: Secondary | ICD-10-CM | POA: Diagnosis not present

## 2023-11-28 DIAGNOSIS — K219 Gastro-esophageal reflux disease without esophagitis: Secondary | ICD-10-CM | POA: Diagnosis not present

## 2023-11-28 DIAGNOSIS — R911 Solitary pulmonary nodule: Secondary | ICD-10-CM | POA: Diagnosis not present

## 2023-11-28 DIAGNOSIS — E1165 Type 2 diabetes mellitus with hyperglycemia: Secondary | ICD-10-CM | POA: Diagnosis not present

## 2023-11-28 LAB — GLUCOSE, CAPILLARY
Glucose-Capillary: 115 mg/dL — ABNORMAL HIGH (ref 70–99)
Glucose-Capillary: 120 mg/dL — ABNORMAL HIGH (ref 70–99)
Glucose-Capillary: 124 mg/dL — ABNORMAL HIGH (ref 70–99)
Glucose-Capillary: 104 mg/dL — ABNORMAL HIGH (ref 70–99)
Glucose-Capillary: 99 mg/dL (ref 70–99)
Glucose-Capillary: 96 mg/dL (ref 70–99)

## 2023-11-28 MED ORDER — POLYETHYLENE GLYCOL 3350 17 G PO PACK
17.0000 g | PACK | Freq: Every day | ORAL | Status: DC
  Administered 2023-11-28 – 2023-11-30 (×3): 17 g
  Filled 2023-11-28 (×3): qty 1

## 2023-11-28 MED ORDER — SENNOSIDES-DOCUSATE SODIUM 8.6-50 MG PO TABS
1.0000 | ORAL_TABLET | Freq: Two times a day (BID) | ORAL | Status: DC
  Administered 2023-11-28 – 2023-11-30 (×5): 1
  Filled 2023-11-28 (×5): qty 1

## 2023-11-28 NOTE — Plan of Care (Signed)
  Problem: Education: Goal: Knowledge of General Education information will improve Description: Including pain rating scale, medication(s)/side effects and non-pharmacologic comfort measures Outcome: Progressing   Problem: Health Behavior/Discharge Planning: Goal: Ability to manage health-related needs will improve Outcome: Progressing   Problem: Clinical Measurements: Goal: Ability to maintain clinical measurements within normal limits will improve Outcome: Progressing Goal: Will remain free from infection Outcome: Progressing Goal: Diagnostic test results will improve Outcome: Progressing Goal: Respiratory complications will improve Outcome: Progressing Goal: Cardiovascular complication will be avoided Outcome: Progressing   Problem: Activity: Goal: Risk for activity intolerance will decrease Outcome: Progressing   Problem: Coping: Goal: Level of anxiety will decrease Outcome: Progressing   Problem: Elimination: Goal: Will not experience complications related to bowel motility Outcome: Progressing Goal: Will not experience complications related to urinary retention Outcome: Progressing   Problem: Pain Managment: Goal: General experience of comfort will improve and/or be controlled Outcome: Progressing   Problem: Nutrition: Goal: Adequate nutrition will be maintained Outcome: Not Progressing

## 2023-11-28 NOTE — Progress Notes (Signed)
 [facility] Progress Note     Subjective: Patient seen and examined.  She is resting comfortably in chair at the side of the bed.  She confirms that she has still been passing flatus yesterday and this morning, though it is less than what she normally passes.  She did have an additional small bowel movement after seeing me yesterday.  Her NG tube is had 500 cc of gastric contents in the last 24 hours.  Her biggest complaint is irritation in her throat from the NG tube.  She denies any abdominal pain, but states that her abdomen is sore.  Objective: Vital signs in last 24 hours: Temp:  [97.6 F (36.4 C)-98.4 F (36.9 C)] 98.2 F (36.8 C) (03/12 0436) Pulse Rate:  [69-75] 75 (03/12 0436) Resp:  [18-21] 21 (03/12 0436) BP: (124-162)/(60-64) 124/64 (03/12 0436) SpO2:  [95 %-100 %] 95 % (03/12 0436) Last BM Date : 11/27/23  Intake/Output from previous day: 03/11 0701 - 03/12 0700 In: 1134.7 [P.O.:150; I.V.:984.7] Out: 750 [Urine:250; Emesis/NG output:500] Intake/Output this shift: No intake/output data recorded.  General appearance: alert, cooperative, and no distress Nose: NG tube in place with minimal gastric contents in canister GI: Abdomen soft, mild distention, no percussion tenderness, nontender to palpation; no rigidity, guarding, rebound tenderness  Lab Results:  Recent Labs    11/26/23 1230 11/27/23 0441  WBC 10.3 10.7*  HGB 13.8 13.3  HCT 40.6 40.2  PLT 223 224   BMET Recent Labs    11/26/23 1230 11/27/23 0441  NA 138 139  K 3.9 3.6  CL 100 102  CO2 25 29  GLUCOSE 157* 160*  BUN 21 18  CREATININE 0.98 1.12*  CALCIUM 9.7 8.7*   PT/INR No results for input(s): "LABPROT", "INR" in the last 72 hours.  Studies/Results: DG Abdomen 1 View Result Date: 11/26/2023 CLINICAL DATA:  NG tube placement. EXAM: ABDOMEN - 1 VIEW COMPARISON:  CT earlier today FINDINGS: Tip and side port of the enteric tube below the diaphragm in the stomach. Dilated  small bowel in the left upper abdomen. Excreted IV contrast in the renal collecting system from earlier CT. IMPRESSION: Tip and side port of the enteric tube below the diaphragm in the stomach. Electronically Signed   By: Narda Rutherford M.D.   On: 11/26/2023 22:30   DG Chest Portable 1 View Result Date: 11/26/2023 CLINICAL DATA:  Hypoxia.  Cough. EXAM: PORTABLE CHEST 1 VIEW COMPARISON:  05/18/2023 FINDINGS: Left-sided pacemaker in place. TAVR. Normal heart size with stable mediastinal contours. Aortic atherosclerosis. No focal airspace disease, pleural effusion, pulmonary edema or pneumothorax. No acute osseous findings. IMPRESSION: No acute findings. Electronically Signed   By: Narda Rutherford M.D.   On: 11/26/2023 22:29   CT ABDOMEN PELVIS W CONTRAST Result Date: 11/26/2023 CLINICAL DATA:  Nausea and vomiting. EXAM: CT ABDOMEN AND PELVIS WITH CONTRAST TECHNIQUE: Multidetector CT imaging of the abdomen and pelvis was performed using the standard protocol following bolus administration of intravenous contrast. RADIATION DOSE REDUCTION: This exam was performed according to the departmental dose-optimization program which includes automated exposure control, adjustment of the mA and/or kV according to patient size and/or use of iterative reconstruction technique. CONTRAST:  OMNIPAQUE IOHEXOL 300 MG/ML  SOLN COMPARISON:  CT abdomen and pelvis 05/18/2023 FINDINGS: Lower chest: Focal ground-glass nodular density in the left lower lobe measures 10 x 7 mm. There is atelectasis in the right lung base. Hepatobiliary: A large gallstone is present measuring 4.2 cm. There is no biliary  ductal dilatation. The liver is within normal limits. Pancreas: Unremarkable. No pancreatic ductal dilatation or surrounding inflammatory changes. Spleen: Normal in size without focal abnormality. Adrenals/Urinary Tract: Mild left renal atrophy present. No hydronephrosis or perinephric fluid. There are bilateral renal cysts  measuring up to 2 cm. Adrenal glands and bladder are within normal limits. Stomach/Bowel: No free air. There is a short segment of small bowel wall thickening in the right lower quadrant. This is transition point for small bowel obstruction. Proximal small bowel is mildly dilated with air-fluid levels. The stomach is mildly dilated with air-fluid level. There is wall thickening of the distal esophagus. The appendix is within normal limits. There is sigmoid and descending colon diverticulosis. Vascular/Lymphatic: Aortic atherosclerosis. No enlarged abdominal or pelvic lymph nodes. Reproductive: Uterus and bilateral adnexa are unremarkable. Other: No abdominal wall hernia or abnormality. No abdominopelvic ascites. Musculoskeletal: Multilevel degenerative changes affect the spine. Right hip arthroplasty is present. There are degenerative changes of the left hip. IMPRESSION: 1. Small-bowel obstruction with transition point in the right lower quadrant where there is a short segment of small bowel wall thickening. Findings may be related to infectious/inflammatory enteritis, however neoplasm is not excluded. 2. Cholelithiasis. 3. Colonic diverticulosis. 4. Wall thickening of the distal esophagus worrisome for esophagitis. 5. 10 x 7 mm ground-glass nodular density in the left lower lobe. Initial follow-up with CT at 6 months is recommended to confirm persistence. If persistent, repeat CT is recommended every 2 years until 5 years of stability has been established. This recommendation follows the consensus statement: Guidelines for Management of Incidental Pulmonary Nodules Detected on CT Images: From the Fleischner Society 2017; Radiology 2017; 284:228-243. 6. Bosniak I benign renal cyst measuring 2 cm. No follow-up imaging is recommended. JACR 2018 Feb; 264-273, Management of the Incidental Renal Mass on CT, [employer] 2021; 814-848, Bosniak Classification of Cystic Renal Masses, Version 2019. Aortic Atherosclerosis  (ICD10-I70.0). Electronically Signed   By: Darliss Cheney M.D.   On: 11/26/2023 20:40   US Abdomen Limited RUQ (LIVER/GB) Result Date: 11/26/2023 CLINICAL DATA:  Severe nausea and vomiting since last night. EXAM: ULTRASOUND ABDOMEN LIMITED RIGHT UPPER QUADRANT COMPARISON:  CT abdomen pelvis 05/18/2023. FINDINGS: Gallbladder: Shadowing echogenic stones measure up to 1.4 cm and do not appear mobile. No wall thickening. Negative sonographic Murphy sign. Common bile duct: Diameter: 6 mm, within normal limits for age. No intrahepatic biliary ductal dilatation. Liver: No focal lesion identified. Within normal limits in parenchymal echogenicity. Portal vein is patent on color Doppler imaging with normal direction of blood flow towards the liver. Other: Incidental imaging of the right kidney shows fullness of the right renal pelvis which likely corresponds to a prominent extrarenal pelvis as on 05/18/2023. IMPRESSION: Cholelithiasis.  No acute findings. Electronically Signed   By: Leanna Battles M.D.   On: 11/26/2023 16:10    Anti-infectives: Anti-infectives (From admission, onward)    None       Assessment/Plan:  Patient is a 89 year old female who was admitted with concern for small bowel obstruction secondary to small bowel wall thickening from either enteritis, inflammation, or possible neoplasm.  -Patient states that she is still having some bowel function, though it is less than she normally does.  Since she is having a little bit of bowel function, I will add on some more of a bowel regimen with Senokot-S and MiraLAX to see if this helps move things for her -KUB today still demonstrating dilated loops of bowel that are mostly unchanged from previous, though there  is some more gas within her colon on this x-ray -Until the patient starts having a little bit more bowel function, I am hesitant to remove her NG tube and initiate a diet -Will maintain NG tube to LIS today and NPO -If patient starts  having significantly increased bowel function, will plan to clamp NG tube and give patient clear liquids -Continue to monitor for bowel function -Continue to hold Plavix in the event patient requires surgical intervention -No acute surgical intervention at this time -Appreciate GI and hospitalist recommendations   LOS: 2 days    Denise Parrish A Wing Gfeller 11/28/2023  Note: Portions of this report may have been transcribed using voice recognition software. Every effort has been made to ensure accuracy; however, inadvertent computerized transcription errors may still be present.

## 2023-11-28 NOTE — Progress Notes (Cosign Needed Addendum)
 Subjective: Patient reports some pain in her nose this morning, states NG tube had to be readjusted earlier. Her abdomen is feeling better since this. No nausea. She did have a small BM this morning. Still passing some flatus this morning. 500cc of output from NG over the last 24 hours.   Objective: Vital signs in last 24 hours: Temp:  [97.6 F (36.4 C)-98.4 F (36.9 C)] 98.2 F (36.8 C) (03/12 0436) Pulse Rate:  [69-75] 75 (03/12 0436) Resp:  [18-21] 21 (03/12 0436) BP: (124-162)/(60-64) 124/64 (03/12 0436) SpO2:  [95 %-100 %] 95 % (03/12 0436) Last BM Date : 11/27/23 General:   Alert and oriented, pleasant Head:  Normocephalic and atraumatic. Eyes:  No icterus, sclera clear. Conjuctiva pink.  Mouth:  Without lesions, mucosa pink and moist.  Heart:  S1, S2 present, no murmurs noted.  Lungs: Clear to auscultation bilaterally, without wheezing, rales, or rhonchi.  Abdomen:  Bowel sounds present, non-tender, distended but soft. No HSM or hernias noted. No rebound or guarding. No masses appreciated  Msk:  Symmetrical without gross deformities. Normal posture. Skin:  Warm and dry, intact without significant lesions.  Psych:  Alert and cooperative. Normal mood and affect.  Intake/Output from previous day: 03/11 0701 - 03/12 0700 In: 1134.7 [P.O.:150; I.V.:984.7] Out: 750 [Urine:250; Emesis/NG output:500] Intake/Output this shift: No intake/output data recorded.  Lab Results: Recent Labs    11/26/23 1230 11/27/23 0441  WBC 10.3 10.7*  HGB 13.8 13.3  HCT 40.6 40.2  PLT 223 224   BMET Recent Labs    11/26/23 1230 11/27/23 0441  NA 138 139  K 3.9 3.6  CL 100 102  CO2 25 29  GLUCOSE 157* 160*  BUN 21 18  CREATININE 0.98 1.12*  CALCIUM 9.7 8.7*   LFT Recent Labs    11/26/23 1230 11/27/23 0441  PROT 6.9 6.4*  ALBUMIN 3.7 3.3*  AST 33 31  ALT 26 28  ALKPHOS 68 68  BILITOT 1.1 1.3*    Studies/Results: DG Abdomen 1 View Result Date: 11/26/2023 CLINICAL DATA:   NG tube placement. EXAM: ABDOMEN - 1 VIEW COMPARISON:  CT earlier today FINDINGS: Tip and side port of the enteric tube below the diaphragm in the stomach. Dilated small bowel in the left upper abdomen. Excreted IV contrast in the renal collecting system from earlier CT. IMPRESSION: Tip and side port of the enteric tube below the diaphragm in the stomach. Electronically Signed   By: Narda Rutherford M.D.   On: 11/26/2023 22:30   DG Chest Portable 1 View Result Date: 11/26/2023 CLINICAL DATA:  Hypoxia.  Cough. EXAM: PORTABLE CHEST 1 VIEW COMPARISON:  05/18/2023 FINDINGS: Left-sided pacemaker in place. TAVR. Normal heart size with stable mediastinal contours. Aortic atherosclerosis. No focal airspace disease, pleural effusion, pulmonary edema or pneumothorax. No acute osseous findings. IMPRESSION: No acute findings. Electronically Signed   By: Narda Rutherford M.D.   On: 11/26/2023 22:29   CT ABDOMEN PELVIS W CONTRAST Result Date: 11/26/2023 CLINICAL DATA:  Nausea and vomiting. EXAM: CT ABDOMEN AND PELVIS WITH CONTRAST TECHNIQUE: Multidetector CT imaging of the abdomen and pelvis was performed using the standard protocol following bolus administration of intravenous contrast. RADIATION DOSE REDUCTION: This exam was performed according to the departmental dose-optimization program which includes automated exposure control, adjustment of the mA and/or kV according to patient size and/or use of iterative reconstruction technique. CONTRAST:  OMNIPAQUE IOHEXOL 300 MG/ML  SOLN COMPARISON:  CT abdomen and pelvis 05/18/2023 FINDINGS: Lower chest: Focal  ground-glass nodular density in the left lower lobe measures 10 x 7 mm. There is atelectasis in the right lung base. Hepatobiliary: A large gallstone is present measuring 4.2 cm. There is no biliary ductal dilatation. The liver is within normal limits. Pancreas: Unremarkable. No pancreatic ductal dilatation or surrounding inflammatory changes. Spleen: Normal in size  without focal abnormality. Adrenals/Urinary Tract: Mild left renal atrophy present. No hydronephrosis or perinephric fluid. There are bilateral renal cysts measuring up to 2 cm. Adrenal glands and bladder are within normal limits. Stomach/Bowel: No free air. There is a short segment of small bowel wall thickening in the right lower quadrant. This is transition point for small bowel obstruction. Proximal small bowel is mildly dilated with air-fluid levels. The stomach is mildly dilated with air-fluid level. There is wall thickening of the distal esophagus. The appendix is within normal limits. There is sigmoid and descending colon diverticulosis. Vascular/Lymphatic: Aortic atherosclerosis. No enlarged abdominal or pelvic lymph nodes. Reproductive: Uterus and bilateral adnexa are unremarkable. Other: No abdominal wall hernia or abnormality. No abdominopelvic ascites. Musculoskeletal: Multilevel degenerative changes affect the spine. Right hip arthroplasty is present. There are degenerative changes of the left hip. IMPRESSION: 1. Small-bowel obstruction with transition point in the right lower quadrant where there is a short segment of small bowel wall thickening. Findings may be related to infectious/inflammatory enteritis, however neoplasm is not excluded. 2. Cholelithiasis. 3. Colonic diverticulosis. 4. Wall thickening of the distal esophagus worrisome for esophagitis. 5. 10 x 7 mm ground-glass nodular density in the left lower lobe. Initial follow-up with CT at 6 months is recommended to confirm persistence. If persistent, repeat CT is recommended every 2 years until 5 years of stability has been established. This recommendation follows the consensus statement: Guidelines for Management of Incidental Pulmonary Nodules Detected on CT Images: From the Fleischner Society 2017; Radiology 2017; 284:228-243. 6. Bosniak I benign renal cyst measuring 2 cm. No follow-up imaging is recommended. JACR 2018 Feb; 264-273,  Management of the Incidental Renal Mass on CT, RadioGraphics 2021; 814-848, Bosniak Classification of Cystic Renal Masses, Version 2019. Aortic Atherosclerosis (ICD10-I70.0). Electronically Signed   By: Darliss Cheney M.D.   On: 11/26/2023 20:40   US Abdomen Limited RUQ (LIVER/GB) Result Date: 11/26/2023 CLINICAL DATA:  Severe nausea and vomiting since last night. EXAM: ULTRASOUND ABDOMEN LIMITED RIGHT UPPER QUADRANT COMPARISON:  CT abdomen pelvis 05/18/2023. FINDINGS: Gallbladder: Shadowing echogenic stones measure up to 1.4 cm and do not appear mobile. No wall thickening. Negative sonographic Murphy sign. Common bile duct: Diameter: 6 mm, within normal limits for age. No intrahepatic biliary ductal dilatation. Liver: No focal lesion identified. Within normal limits in parenchymal echogenicity. Portal vein is patent on color Doppler imaging with normal direction of blood flow towards the liver. Other: Incidental imaging of the right kidney shows fullness of the right renal pelvis which likely corresponds to a prominent extrarenal pelvis as on 05/18/2023. IMPRESSION: Cholelithiasis.  No acute findings. Electronically Signed   By: Leanna Battles M.D.   On: 11/26/2023 16:10    Assessment: Denise Parrish is a 89 year old female with history of DM, COPD, CVA, mild cognitive impairment, HTN, carotid artery disease, severe aortic stenosis s/p TAVR, coronary artery disease s/p stent placement in December 2021 on Plavix, paroxysmal AV block s/p dual-chamber pacemaker, GERD, prior GI bleeding in 2013, symptomatic anemia with melena in March 2022 s/p EGD, colonoscopy, capsule study which did not identify source of bleeding. She was found to have questionable nodule/small mass in the distal  small bowel. Had discussed repeat colonoscopy to determine if small bowel nodule could be reached, but ultimately patient preferred to hold off on this as her hemoglobin was stable/improving. She is now admitted with SBO after  presenting with acute onset nausea/vomiting.   SBO: CT A/P with contrast on 3/10 showed small bowel obstruction with transition point in the right lower quadrant where there is a short segment of small bowel wall thickening concerning for infectious/laboratory enteritis, but neoplasm not excluded.  Patient has improved clinically with NG tube placement.  Nausea vomiting have subsided, passing flatus.  No bowel movements.  General surgery on board as ordered dulcolax suppositories and added senokot and miralax today. Repeat  KUB today still demonstrating dilated loops of bowel mostly unchanged from previous, though there is some more gas within the colon   Further management of SBO deferred to general surgery at this time.  Etiology of SBO/small bowel wall thickening is unclear.  No symptoms of enteritis such as diarrhea.  Questionable small bowel nodule/mass in distal small bowel on Givens capsule in 2022. Patient not interested in pursing aggressive management/evaluation, though could consider CT enterography in the outpatient setting once she recovers from acute illness.  Could consider repeat capsule study with patency capsule prior to depending on findings of enterography.  Esophageal wall thickening: Noted on CT.  No significant GERD symptoms.  Likely related to vomiting.  Will continue daily PPI   Plan: -Continue Dulcolax suppository, senokot and miralax as recommended by GS -diet advancement per general surgery -Outpatient small bowel enterography with considerations of capsule endoscopy with patency capsule in outpatient setting -Continue daily PPI -maintain NG tube -OOB and ambulation as much as possible    LOS: 2 days    11/28/2023, 9:29 AM   Aarian Cleaver L. Jeanmarie Hubert, MSN, APRN, AGNP-C Adult-Gerontology Nurse Practitioner [facility] at [facility]

## 2023-11-28 NOTE — Progress Notes (Signed)
 Patient has rested well this shift.  Patient has had no c/o nausea, but approximately 400cc of ng output this shift.  Patient has had no c/o pain.  Patient has bowel sounds on right side, but no bowel movement this shift. Vitals have been stable. No acute events overnight.

## 2023-11-28 NOTE — Progress Notes (Signed)
 PROGRESS NOTE  Denise Parrish, is a 89 y.o. female, DOB - 09/16/1923, JWJ:[id number]  Admit date - 11/26/2023   Admitting Physician Frankey Shown, DO  Outpatient Primary MD for the patient is Lemar Lofty, DO  LOS - 2  Chief Complaint  Patient presents with   Nausea      Brief Narrative:  89 y.o. female with medical history significant of hypertension, hyperlipidemia, GERD, CAD s/p stent placement, complete AV block status post pacemaker, severe aortic stenosis s/pTAVR admitted on 11/26/2023 with SBO.     -Assessment and Plan: 1)SBO--??etiology..... Differential diagnosis include enteritis, inflammatory changes or possible neoplasm -Continue conservative management advised by GI and general surgery at this time -Patient and family also requesting conservative management at this time. -They may consider outpatient CT enterography as well as repeat patency and capsule endoscopy down the road if symptoms do not resolve. -Appreciate assistance and recommendation by GI and general surgery. -Patient had bowel movement again today; no nausea or vomiting.  NG tube in place. -Continue IV Compazine as needed nausea and vomiting  2) GERD/esophagitis - Continue PPI.  3)Pulmonary nodule -CT abdomen pelvis with contrast showed 0 x 7 mm ground-glass nodular density in the left lower lobe. -Patient is 89 years old, patient and family would like to defer further investigations at this time.  4)HTN--- metoprolol on hold  -continue hydralazine as needed -Follow vital signs.  5)DM2-.  A1c is 5.3 reflecting excellent diabetic control PTA -Continue to hold glimepiride -Continue use Novolog/Humalog Sliding scale insulin with Accu-Cheks/Fingersticks as ordered   6)H/o Prior CVA--- hold Plavix in case patient needs surgical intervention for her bowel problems. -Continue to follow further recommendation by general surgery.  7)CAD s/p stent placement, complete AV block status post pacemaker,  severe aortic stenosis s/pTAVR----metoprolol and Plavix on hold while n.p.o. and with NG tube in place.   Status is: Inpatient   Disposition: The patient is from: Home              Anticipated d/c is to: Home              Anticipated d/c date is: 2 days              Patient currently is not medically stable to d/c.  Barriers: Not Clinically Stable-   Code Status :  -  Code Status: Full Code   Family Communication:    NA (patient is alert, awake and coherent)   DVT Prophylaxis  :   - SCDs  SCDs Start: 11/27/23 0102   Lab Results  Component Value Date   PLT 224 11/27/2023    Inpatient Medications  Scheduled Meds:  ALPRAZolam  0.5 mg Per Tube QHS   bisacodyl  10 mg Rectal Daily   insulin aspart  0-9 Units Subcutaneous Q4H   pantoprazole (PROTONIX) IV  40 mg Intravenous Q24H   polyethylene glycol  17 g Per Tube Daily   senna-docusate  1 tablet Per Tube BID   Continuous Infusions:  sodium chloride 83 mL/hr at 11/28/23 1223   PRN Meds:.phenol, prochlorperazine   Anti-infectives (From admission, onward)    None       Subjective: Denise Parrish no fever, no chest pain, no nausea or vomiting.  NG tube in place.  Patient had bowel movements after receiving bowel regimen meds.   Objective: Vitals:   11/28/23 0436 11/28/23 1032 11/28/23 1045 11/28/23 1401  BP: 124/64   (!) 143/47  Pulse: 75   71  Resp: (!)  21     Temp: 98.2 F (36.8 C)   98.4 F (36.9 C)  TempSrc:      SpO2: 95% 98% 99% 98%  Weight:      Height:        Intake/Output Summary (Last 24 hours) at 11/28/2023 1738 Last data filed at 11/28/2023 0600 Gross per 24 hour  Intake 984.73 ml  Output 750 ml  Net 234.73 ml   Filed Weights   11/26/23 1217  Weight: 73.5 kg    Physical Exam General exam: Alert, awake, oriented x 3; in no major distress.  NG tube in place.  Reports having bowel movements, no nausea or vomiting. Respiratory system: Good saturation on room air. Cardiovascular  system:RRR. No rubs or gallops; no JVD. Gastrointestinal system: Abdomen is nondistended, soft and nontender.  Positive bowel sounds appreciated on exam. Central nervous system: Moving 4 limbs spontaneously.  No focal neurological deficits. Extremities: No cyanosis or clubbing. Skin: No petechiae. Psychiatry: Mood & affect appropriate.   Data Reviewed: I have personally reviewed following labs and imaging studies  CBC: Recent Labs  Lab 11/26/23 1230 11/27/23 0441  WBC 10.3 10.7*  NEUTROABS 8.4*  --   HGB 13.8 13.3  HCT 40.6 40.2  MCV 92.3 94.8  PLT 223 224   Basic Metabolic Panel: Recent Labs  Lab 11/26/23 1230 11/27/23 0441  NA 138 139  K 3.9 3.6  CL 100 102  CO2 25 29  GLUCOSE 157* 160*  BUN 21 18  CREATININE 0.98 1.12*  CALCIUM 9.7 8.7*  MG  --  2.1  PHOS  --  4.9*   GFR: Estimated Creatinine Clearance: 28.1 mL/min (A) (by C-G formula based on SCr of 1.12 mg/dL (H)).  Liver Function Tests: Recent Labs  Lab 11/26/23 1230 11/27/23 0441  AST 33 31  ALT 26 28  ALKPHOS 68 68  BILITOT 1.1 1.3*  PROT 6.9 6.4*  ALBUMIN 3.7 3.3*   HbA1C: Recent Labs    11/26/23 1230  HGBA1C 5.8*   Recent Results (from the past 240 hours)  Resp panel by RT-PCR (RSV, Flu A&B, Covid) Anterior Nasal Swab     Status: None   Collection Time: 11/26/23 10:00 PM   Specimen: Anterior Nasal Swab  Result Value Ref Range Status   SARS Coronavirus 2 by RT PCR NEGATIVE NEGATIVE Final    Comment: (NOTE) SARS-CoV-2 target nucleic acids are NOT DETECTED.  The SARS-CoV-2 RNA is generally detectable in upper respiratory specimens during the acute phase of infection. The lowest concentration of SARS-CoV-2 viral copies this assay can detect is 138 copies/mL. A negative result does not preclude SARS-Cov-2 infection and should not be used as the sole basis for treatment or other patient management decisions. A negative result may occur with  improper specimen collection/handling, submission  of specimen other than nasopharyngeal swab, presence of viral mutation(s) within the areas targeted by this assay, and inadequate number of viral copies(<138 copies/mL). A negative result must be combined with clinical observations, patient history, and epidemiological information. The expected result is Negative.  Fact Sheet for Patients:  BloggerCourse.com  Fact Sheet for Healthcare Providers:  SeriousBroker.it  This test is no t yet approved or cleared by the [place] FDA and  has been authorized for detection and/or diagnosis of SARS-CoV-2 by FDA under an Emergency Use Authorization (EUA). This EUA will remain  in effect (meaning this test can be used) for the duration of the COVID-19 declaration under Section 564(b)(1) of the Act, 21  U.S.C.section 360bbb-3(b)(1), unless the authorization is terminated  or revoked sooner.       Influenza A by PCR NEGATIVE NEGATIVE Final   Influenza B by PCR NEGATIVE NEGATIVE Final    Comment: (NOTE) The Xpert Xpress SARS-CoV-2/FLU/RSV plus assay is intended as an aid in the diagnosis of influenza from Nasopharyngeal swab specimens and should not be used as a sole basis for treatment. Nasal washings and aspirates are unacceptable for Xpert Xpress SARS-CoV-2/FLU/RSV testing.  Fact Sheet for Patients: BloggerCourse.com  Fact Sheet for Healthcare Providers: SeriousBroker.it  This test is not yet approved or cleared by the [place] FDA and has been authorized for detection and/or diagnosis of SARS-CoV-2 by FDA under an Emergency Use Authorization (EUA). This EUA will remain in effect (meaning this test can be used) for the duration of the COVID-19 declaration under Section 564(b)(1) of the Act, 21 U.S.C. section 360bbb-3(b)(1), unless the authorization is terminated or revoked.     Resp Syncytial Virus by PCR NEGATIVE NEGATIVE  Final    Comment: (NOTE) Fact Sheet for Patients: BloggerCourse.com  Fact Sheet for Healthcare Providers: SeriousBroker.it  This test is not yet approved or cleared by the [place] FDA and has been authorized for detection and/or diagnosis of SARS-CoV-2 by FDA under an Emergency Use Authorization (EUA). This EUA will remain in effect (meaning this test can be used) for the duration of the COVID-19 declaration under Section 564(b)(1) of the Act, 21 U.S.C. section 360bbb-3(b)(1), unless the authorization is terminated or revoked.  Performed at [facility], [address], [place], [place] [address]     Radiology Studies: DG Abd 1 View Result Date: 11/28/2023 CLINICAL DATA:  Small bowel obstruction. EXAM: ABDOMEN - 1 VIEW COMPARISON:  November 26, 2023. FINDINGS: Distal tip of nasogastric tube is seen in proximal stomach. Mildly dilated small bowel loops are noted concerning for ileus or distal small bowel obstruction. No colonic dilatation is noted. IMPRESSION: Mildly dilated small bowel loops are again noted concerning for ileus or distal small bowel obstruction. Electronically Signed   By: Lupita Raider M.D.   On: 11/28/2023 13:12   DG Abdomen 1 View Result Date: 11/26/2023 CLINICAL DATA:  NG tube placement. EXAM: ABDOMEN - 1 VIEW COMPARISON:  CT earlier today FINDINGS: Tip and side port of the enteric tube below the diaphragm in the stomach. Dilated small bowel in the left upper abdomen. Excreted IV contrast in the renal collecting system from earlier CT. IMPRESSION: Tip and side port of the enteric tube below the diaphragm in the stomach. Electronically Signed   By: Narda Rutherford M.D.   On: 11/26/2023 22:30   DG Chest Portable 1 View Result Date: 11/26/2023 CLINICAL DATA:  Hypoxia.  Cough. EXAM: PORTABLE CHEST 1 VIEW COMPARISON:  05/18/2023 FINDINGS: Left-sided pacemaker in place. TAVR. Normal heart size with stable mediastinal  contours. Aortic atherosclerosis. No focal airspace disease, pleural effusion, pulmonary edema or pneumothorax. No acute osseous findings. IMPRESSION: No acute findings. Electronically Signed   By: Narda Rutherford M.D.   On: 11/26/2023 22:29   CT ABDOMEN PELVIS W CONTRAST Result Date: 11/26/2023 CLINICAL DATA:  Nausea and vomiting. EXAM: CT ABDOMEN AND PELVIS WITH CONTRAST TECHNIQUE: Multidetector CT imaging of the abdomen and pelvis was performed using the standard protocol following bolus administration of intravenous contrast. RADIATION DOSE REDUCTION: This exam was performed according to the departmental dose-optimization program which includes automated exposure control, adjustment of the mA and/or kV according to patient size and/or use of iterative reconstruction  technique. CONTRAST:  OMNIPAQUE IOHEXOL 300 MG/ML  SOLN COMPARISON:  CT abdomen and pelvis 05/18/2023 FINDINGS: Lower chest: Focal ground-glass nodular density in the left lower lobe measures 10 x 7 mm. There is atelectasis in the right lung base. Hepatobiliary: A large gallstone is present measuring 4.2 cm. There is no biliary ductal dilatation. The liver is within normal limits. Pancreas: Unremarkable. No pancreatic ductal dilatation or surrounding inflammatory changes. Spleen: Normal in size without focal abnormality. Adrenals/Urinary Tract: Mild left renal atrophy present. No hydronephrosis or perinephric fluid. There are bilateral renal cysts measuring up to 2 cm. Adrenal glands and bladder are within normal limits. Stomach/Bowel: No free air. There is a short segment of small bowel wall thickening in the right lower quadrant. This is transition point for small bowel obstruction. Proximal small bowel is mildly dilated with air-fluid levels. The stomach is mildly dilated with air-fluid level. There is wall thickening of the distal esophagus. The appendix is within normal limits. There is sigmoid and descending colon diverticulosis.  Vascular/Lymphatic: Aortic atherosclerosis. No enlarged abdominal or pelvic lymph nodes. Reproductive: Uterus and bilateral adnexa are unremarkable. Other: No abdominal wall hernia or abnormality. No abdominopelvic ascites. Musculoskeletal: Multilevel degenerative changes affect the spine. Right hip arthroplasty is present. There are degenerative changes of the left hip. IMPRESSION: 1. Small-bowel obstruction with transition point in the right lower quadrant where there is a short segment of small bowel wall thickening. Findings may be related to infectious/inflammatory enteritis, however neoplasm is not excluded. 2. Cholelithiasis. 3. Colonic diverticulosis. 4. Wall thickening of the distal esophagus worrisome for esophagitis. 5. 10 x 7 mm ground-glass nodular density in the left lower lobe. Initial follow-up with CT at 6 months is recommended to confirm persistence. If persistent, repeat CT is recommended every 2 years until 5 years of stability has been established. This recommendation follows the consensus statement: Guidelines for Management of Incidental Pulmonary Nodules Detected on CT Images: From the Fleischner Society 2017; Radiology 2017; 284:228-243. 6. Bosniak I benign renal cyst measuring 2 cm. No follow-up imaging is recommended. JACR 2018 Feb; 264-273, Management of the Incidental Renal Mass on CT, RadioGraphics 2021; 814-848, Bosniak Classification of Cystic Renal Masses, Version 2019. Aortic Atherosclerosis (ICD10-I70.0). Electronically Signed   By: Darliss Cheney M.D.   On: 11/26/2023 20:40   Scheduled Meds:  ALPRAZolam  0.5 mg Per Tube QHS   bisacodyl  10 mg Rectal Daily   insulin aspart  0-9 Units Subcutaneous Q4H   pantoprazole (PROTONIX) IV  40 mg Intravenous Q24H   polyethylene glycol  17 g Per Tube Daily   senna-docusate  1 tablet Per Tube BID   Continuous Infusions:  sodium chloride 83 mL/hr at 11/28/23 1223    LOS: 2 days   Vassie Loll M.D on 11/28/2023 at 5:38 PM  Go to  [email or web address] - for contact info  [facility] - Office  [phone]  If 7PM-7AM, please contact night-coverage [email or web address] 11/28/2023, 5:38 PM

## 2023-11-28 NOTE — Progress Notes (Signed)
 Mobility Specialist Progress Note:   11/28/23 1000  Mobility  Activity Transferred to/from [facility];Ambulated with assistance in room  Level of Assistance Contact guard assist, steadying assist  Assistive Device None  Distance Ambulated (ft) 8 ft  Range of Motion/Exercises Active;All extremities  Activity Response Tolerated well  Mobility Referral Yes  Mobility visit 1 Mobility  Mobility Specialist Start Time (ACUTE ONLY) 0950  Mobility Specialist Stop Time (ACUTE ONLY) 1000  Mobility Specialist Time Calculation (min) (ACUTE ONLY) 10 min   Pt received in chair requesting assistance to bathroom. Required CGA to stand and transfer with no AD. Tolerated well, asx throughout. Left pt with NT, all needs met.   Lawerance Bach Mobility Specialist Please contact via Special educational needs teacher or  Rehab office at [phone]

## 2023-11-29 DIAGNOSIS — R911 Solitary pulmonary nodule: Secondary | ICD-10-CM | POA: Diagnosis not present

## 2023-11-29 DIAGNOSIS — E1165 Type 2 diabetes mellitus with hyperglycemia: Secondary | ICD-10-CM | POA: Diagnosis not present

## 2023-11-29 DIAGNOSIS — K56609 Unspecified intestinal obstruction, unspecified as to partial versus complete obstruction: Secondary | ICD-10-CM | POA: Diagnosis not present

## 2023-11-29 DIAGNOSIS — K219 Gastro-esophageal reflux disease without esophagitis: Secondary | ICD-10-CM | POA: Diagnosis not present

## 2023-11-29 LAB — GLUCOSE, CAPILLARY
Glucose-Capillary: 109 mg/dL — ABNORMAL HIGH (ref 70–99)
Glucose-Capillary: 119 mg/dL — ABNORMAL HIGH (ref 70–99)
Glucose-Capillary: 121 mg/dL — ABNORMAL HIGH (ref 70–99)
Glucose-Capillary: 89 mg/dL (ref 70–99)
Glucose-Capillary: 90 mg/dL (ref 70–99)
Glucose-Capillary: 97 mg/dL (ref 70–99)

## 2023-11-29 MED ORDER — ACETAMINOPHEN 325 MG PO TABS: 650.0000 mg | ORAL_TABLET | Freq: Four times a day (QID) | ORAL | Status: DC | PRN

## 2023-11-29 NOTE — Progress Notes (Signed)
 [facility] Progress Note     Subjective: Patient seen and examined.  She is resting comfortably in chair beside her bed.  She states that she is feeling better this morning she denies any abdominal pain, and denies nausea and vomiting.  She confirms passing flatus and had another large bowel movement this morning.  She also had 2 bowel movements yesterday.  NG tube with 300 cc of gastric contents out in the last 24 hours  Objective: Vital signs in last 24 hours: Temp:  [97.8 F (36.6 C)-98.4 F (36.9 C)] 97.8 F (36.6 C) (03/13 0443) Pulse Rate:  [70-79] 79 (03/13 0443) Resp:  [18-22] 22 (03/13 0443) BP: (141-144)/(43-53) 144/53 (03/13 0443) SpO2:  [93 %-98 %] 93 % (03/13 0443) Last BM Date : 11/28/23  Intake/Output from previous day: 03/12 0701 - 03/13 0700 In: 0  Out: 800 [Urine:500; Emesis/NG output:300] Intake/Output this shift: No intake/output data recorded.  General appearance: alert, cooperative, and no distress Nose: NG tube in place with minimal gastric contents GI: Abdomen soft, mildly distended, no percussion tenderness, nontender to palpation; no rigidity, guarding, rebound tenderness  Lab Results:  Recent Labs    11/26/23 1230 11/27/23 0441  WBC 10.3 10.7*  HGB 13.8 13.3  HCT 40.6 40.2  PLT 223 224   BMET Recent Labs    11/26/23 1230 11/27/23 0441  NA 138 139  K 3.9 3.6  CL 100 102  CO2 25 29  GLUCOSE 157* 160*  BUN 21 18  CREATININE 0.98 1.12*  CALCIUM 9.7 8.7*   PT/INR No results for input(s): "LABPROT", "INR" in the last 72 hours.  Studies/Results: DG Abd 1 View Result Date: 11/28/2023 CLINICAL DATA:  Small bowel obstruction. EXAM: ABDOMEN - 1 VIEW COMPARISON:  November 26, 2023. FINDINGS: Distal tip of nasogastric tube is seen in proximal stomach. Mildly dilated small bowel loops are noted concerning for ileus or distal small bowel obstruction. No colonic dilatation is noted. IMPRESSION: Mildly dilated small bowel loops are  again noted concerning for ileus or distal small bowel obstruction. Electronically Signed   By: Lupita Raider M.D.   On: 11/28/2023 13:12    Anti-infectives: Anti-infectives (From admission, onward)    None       Assessment/Plan:  Patient is a 89 year old female who was admitted with concern for small bowel obstruction secondary to small bowel wall thickening from enterocolitis, inflammation, or possible neoplasm.  -Patient continues to have bowel function after receiving bowel regimen yesterday without any nausea or vomiting -Will discontinue NG tube and initiate clear liquids -May advance diet as patient tolerates -Continue bowel regimen with Senokot as, MiraLAX, and Dulcolax suppositories -IV fluids per primary team -Monitor for bowel function -Hold Plavix until patient is tolerating diet -No acute surgical intervention at this time -Appreciate GI and hospitalist recommendations   LOS: 3 days    Jeaneen Cala A Jream Broyles 11/29/2023  Note: Portions of this report may have been transcribed using voice recognition software. Every effort has been made to ensure accuracy; however, inadvertent computerized transcription errors may still be present.

## 2023-11-29 NOTE — Plan of Care (Signed)
  Problem: Education: Goal: Knowledge of General Education information will improve Description: Including pain rating scale, medication(s)/side effects and non-pharmacologic comfort measures Outcome: Progressing   Problem: Clinical Measurements: Goal: Diagnostic test results will improve Outcome: Progressing Goal: Respiratory complications will improve Outcome: Progressing   Problem: Activity: Goal: Risk for activity intolerance will decrease Outcome: Progressing   Problem: Nutrition: Goal: Adequate nutrition will be maintained Outcome: Progressing   Problem: Elimination: Goal: Will not experience complications related to bowel motility Outcome: Progressing

## 2023-11-29 NOTE — Progress Notes (Signed)
 Chart reviewed.  Patient not seen or examined.  General surgery has been following with patient as well as GI given small bowel obstruction.  GI has seen patient in the past for symptomatic anemia with prior EGD, colonoscopy, and capsule study without identifiable source of bleeding.  Dieulafoy lesion suspected at that time.  CT this admission with small obstruction with transition point in the right lower quadrant where there is a short segment of small bowel wall thickening concerning for infectious/inflammatory enteritis, unable to exclude neoplasm.  Has had NG tube placement.  Suppositories have been ordered.  Patient has been passing flatus.  As of yesterday she had about 500 cc output from her NG tube.  Reported a very small bowel movement yesterday morning and she was without nausea.  Denied any abdominal pain, only pain in her nose from NG tube.  Given her age and patient wishes, she would like to avoid surgery and so far has been progressing well with conservative management.  It has been discussed with her about pursuing possible etiology of her small bowel obstruction given she does have evidence of a prior small bowel nodule.  Per her discussions with Dr. Levon Hedger on Tuesday she has not interested in undergoing aggressive management or evaluation of her small bowel obstruction unless deemed absolutely necessary.   KUB yesterday with ongoing dilated small bowel loops concerning for ileus versus distal SBO.  General surgery has seen patient today and NGT removed and placed on clear liquid diet, reported 2 BM overnight.   Given she is primarily being managed by general surgery, GI will sign off at this time, if patient decides that she would like to pursue further evaluation into etiology of of her SBO, we can plan for outpatient enterography with consideration for capsule endoscopy with patency/agile capsule first.  Would recommend frequent ambulation and bowel regimen.   Brooke Bonito, MSN,  APRN, FNP-BC, AGACNP-BC [facility] at [facility]

## 2023-11-29 NOTE — Progress Notes (Signed)
 PROGRESS NOTE  Denise Parrish, is a 89 y.o. female, DOB - 1922/10/07, ZOX:[id number]  Admit date - 11/26/2023   Admitting Physician Frankey Shown, DO  Outpatient Primary MD for the patient is Lemar Lofty, DO  LOS - 3  Chief Complaint  Patient presents with   Nausea      Brief Narrative:  89 y.o. female with medical history significant of hypertension, hyperlipidemia, GERD, CAD s/p stent placement, complete AV block status post pacemaker, severe aortic stenosis s/pTAVR admitted on 11/26/2023 with SBO.     -Assessment and Plan: 1)SBO--??etiology..... Differential diagnosis include enteritis, inflammatory changes or possible neoplasm -Continue conservative management advised by GI and general surgery at this time -Patient and family also requesting conservative management at this time. -They may consider outpatient CT enterography as well as repeat patency and capsule endoscopy down the road if symptoms do not resolve. -Appreciate assistance and recommendation by GI and general surgery. -Patient has continued to move her bowels and is having flatus; NG tube has been removed and following general surgery recommendations will further advance diet. -Continue IV Compazine as needed for nausea and vomiting -Hoping for discharge as early 30 1425 is remained stable.  2) GERD/esophagitis -Continue PPI.  3)Pulmonary nodule -CT abdomen pelvis with contrast showed 0 x 7 mm ground-glass nodular density in the left lower lobe. -Patient is 89 years old, patient and family would like to defer further investigations at this time.  4)HTN--- metoprolol on hold  -continue hydralazine as needed -Follow vital signs.  5)DM2-.  A1c is 5.3 reflecting excellent diabetic control PTA -Continue to hold glimepiride -Continue use Novolog/Humalog Sliding scale insulin with Accu-Cheks/Fingersticks as ordered   6)H/o Prior CVA--- hold Plavix in case patient needs surgical intervention for her bowel  problems. -Continue to follow further recommendation by general surgery.  7)CAD s/p stent placement, complete AV block status post pacemaker, severe aortic stenosis s/pTAVR--- -metoprolol and Plavix on hold at the moment -If diet advanced and tolerated planning to resume medications in 11/30/2023.   Status is: Inpatient   Disposition: The patient is from: Home              Anticipated d/c is to: Home              Anticipated d/c date is: 2 days              Patient currently is not medically stable to d/c.  Barriers: Not Clinically Stable-   Code Status :  -  Code Status: Full Code   Family Communication:    NA (patient is alert, awake and coherent)   DVT Prophylaxis  :   - SCDs  SCDs Start: 11/27/23 0102   Lab Results  Component Value Date   PLT 224 11/27/2023    Inpatient Medications  Scheduled Meds:  ALPRAZolam  0.5 mg Per Tube QHS   bisacodyl  10 mg Rectal Daily   insulin aspart  0-9 Units Subcutaneous Q4H   pantoprazole (PROTONIX) IV  40 mg Intravenous Q24H   polyethylene glycol  17 g Per Tube Daily   senna-docusate  1 tablet Per Tube BID   Continuous Infusions:   PRN Meds:.acetaminophen, phenol, prochlorperazine   Anti-infectives (From admission, onward)    None       Subjective: Denise Parrish no fever, no chest pain, no nausea vomiting.  Reports passing gas and further having bowel movements.  At time of examination NG tube has been removed and there are plans to advance diet.  Objective: Vitals:   11/28/23 1401 11/28/23 1942 11/29/23 0443 11/29/23 1254  BP: (!) 143/47 (!) 141/43 (!) 144/53 (!) 151/48  Pulse: 71 70 79 70  Resp:  18 (!) 22 20  Temp: 98.4 F (36.9 C) 98.4 F (36.9 C) 97.8 F (36.6 C) 97.8 F (36.6 C)  TempSrc:  Oral  Oral  SpO2: 98% 93% 93% 97%  Weight:      Height:        Intake/Output Summary (Last 24 hours) at 11/29/2023 1933 Last data filed at 11/29/2023 1254 Gross per 24 hour  Intake 500 ml  Output 500 ml  Net 0  ml   Filed Weights   11/26/23 1217  Weight: 73.5 kg    Physical Exam General exam: Alert, awake, oriented x 3; in no acute distress.  Reports continue moving her bowels and passing flatus.  No nausea or vomiting.  Patient is afebrile. Respiratory system: Good saturation on room air. Cardiovascular system:RRR. No rubs or gallops; no JVD. Gastrointestinal system: Abdomen is nondistended, soft and no tenderness on palpation.  Positive bowel sounds appreciated on exam. Central nervous system: Alert and oriented. No focal neurological deficits. Extremities: No cyanosis or clubbing. Skin: No petechiae. Psychiatry: Mood and affect appropriate.  Data Reviewed: I have personally reviewed following labs and imaging studies  CBC: Recent Labs  Lab 11/26/23 1230 11/27/23 0441  WBC 10.3 10.7*  NEUTROABS 8.4*  --   HGB 13.8 13.3  HCT 40.6 40.2  MCV 92.3 94.8  PLT 223 224   Basic Metabolic Panel: Recent Labs  Lab 11/26/23 1230 11/27/23 0441  NA 138 139  K 3.9 3.6  CL 100 102  CO2 25 29  GLUCOSE 157* 160*  BUN 21 18  CREATININE 0.98 1.12*  CALCIUM 9.7 8.7*  MG  --  2.1  PHOS  --  4.9*   GFR: Estimated Creatinine Clearance: 28.1 mL/min (A) (by C-G formula based on SCr of 1.12 mg/dL (H)).  Liver Function Tests: Recent Labs  Lab 11/26/23 1230 11/27/23 0441  AST 33 31  ALT 26 28  ALKPHOS 68 68  BILITOT 1.1 1.3*  PROT 6.9 6.4*  ALBUMIN 3.7 3.3*   HbA1C: No results for input(s): "HGBA1C" in the last 72 hours.  Recent Results (from the past 240 hours)  Resp panel by RT-PCR (RSV, Flu A&B, Covid) Anterior Nasal Swab     Status: None   Collection Time: 11/26/23 10:00 PM   Specimen: Anterior Nasal Swab  Result Value Ref Range Status   SARS Coronavirus 2 by RT PCR NEGATIVE NEGATIVE Final    Comment: (NOTE) SARS-CoV-2 target nucleic acids are NOT DETECTED.  The SARS-CoV-2 RNA is generally detectable in upper respiratory specimens during the acute phase of infection. The  lowest concentration of SARS-CoV-2 viral copies this assay can detect is 138 copies/mL. A negative result does not preclude SARS-Cov-2 infection and should not be used as the sole basis for treatment or other patient management decisions. A negative result may occur with  improper specimen collection/handling, submission of specimen other than nasopharyngeal swab, presence of viral mutation(s) within the areas targeted by this assay, and inadequate number of viral copies(<138 copies/mL). A negative result must be combined with clinical observations, patient history, and epidemiological information. The expected result is Negative.  Fact Sheet for Patients:  BloggerCourse.com  Fact Sheet for Healthcare Providers:  SeriousBroker.it  This test is no t yet approved or cleared by the [place] and  has been authorized for  detection and/or diagnosis of SARS-CoV-2 by FDA under an Emergency Use Authorization (EUA). This EUA will remain  in effect (meaning this test can be used) for the duration of the COVID-19 declaration under Section 564(b)(1) of the Act, 21 U.S.C.section 360bbb-3(b)(1), unless the authorization is terminated  or revoked sooner.       Influenza A by PCR NEGATIVE NEGATIVE Final   Influenza B by PCR NEGATIVE NEGATIVE Final    Comment: (NOTE) The Xpert Xpress SARS-CoV-2/FLU/RSV plus assay is intended as an aid in the diagnosis of influenza from Nasopharyngeal swab specimens and should not be used as a sole basis for treatment. Nasal washings and aspirates are unacceptable for Xpert Xpress SARS-CoV-2/FLU/RSV testing.  Fact Sheet for Patients: BloggerCourse.com  Fact Sheet for Healthcare Providers: SeriousBroker.it  This test is not yet approved or cleared by the [place] FDA and has been authorized for detection and/or diagnosis of SARS-CoV-2 by FDA under  an Emergency Use Authorization (EUA). This EUA will remain in effect (meaning this test can be used) for the duration of the COVID-19 declaration under Section 564(b)(1) of the Act, 21 U.S.C. section 360bbb-3(b)(1), unless the authorization is terminated or revoked.     Resp Syncytial Virus by PCR NEGATIVE NEGATIVE Final    Comment: (NOTE) Fact Sheet for Patients: BloggerCourse.com  Fact Sheet for Healthcare Providers: SeriousBroker.it  This test is not yet approved or cleared by the [place] FDA and has been authorized for detection and/or diagnosis of SARS-CoV-2 by FDA under an Emergency Use Authorization (EUA). This EUA will remain in effect (meaning this test can be used) for the duration of the COVID-19 declaration under Section 564(b)(1) of the Act, 21 U.S.C. section 360bbb-3(b)(1), unless the authorization is terminated or revoked.  Performed at [facility], [address], [place], [place] [address]     Radiology Studies: DG Abd 1 View Result Date: 11/28/2023 CLINICAL DATA:  Small bowel obstruction. EXAM: ABDOMEN - 1 VIEW COMPARISON:  November 26, 2023. FINDINGS: Distal tip of nasogastric tube is seen in proximal stomach. Mildly dilated small bowel loops are noted concerning for ileus or distal small bowel obstruction. No colonic dilatation is noted. IMPRESSION: Mildly dilated small bowel loops are again noted concerning for ileus or distal small bowel obstruction. Electronically Signed   By: Lupita Raider M.D.   On: 11/28/2023 13:12   Scheduled Meds:  ALPRAZolam  0.5 mg Per Tube QHS   bisacodyl  10 mg Rectal Daily   insulin aspart  0-9 Units Subcutaneous Q4H   pantoprazole (PROTONIX) IV  40 mg Intravenous Q24H   polyethylene glycol  17 g Per Tube Daily   senna-docusate  1 tablet Per Tube BID   Continuous Infusions:    LOS: 3 days   Vassie Loll M.D on 11/29/2023 at 7:33 PM  Go to [email or web address] - for contact  info  [facility] - Office  [phone]  If 7PM-7AM, please contact night-coverage [email or web address] 11/29/2023, 7:33 PM

## 2023-11-30 DIAGNOSIS — E1165 Type 2 diabetes mellitus with hyperglycemia: Secondary | ICD-10-CM | POA: Diagnosis not present

## 2023-11-30 DIAGNOSIS — I10 Essential (primary) hypertension: Secondary | ICD-10-CM | POA: Diagnosis not present

## 2023-11-30 DIAGNOSIS — K56609 Unspecified intestinal obstruction, unspecified as to partial versus complete obstruction: Secondary | ICD-10-CM | POA: Diagnosis not present

## 2023-11-30 DIAGNOSIS — R935 Abnormal findings on diagnostic imaging of other abdominal regions, including retroperitoneum: Secondary | ICD-10-CM | POA: Diagnosis not present

## 2023-11-30 LAB — GLUCOSE, CAPILLARY
Glucose-Capillary: 101 mg/dL — ABNORMAL HIGH (ref 70–99)
Glucose-Capillary: 106 mg/dL — ABNORMAL HIGH (ref 70–99)
Glucose-Capillary: 129 mg/dL — ABNORMAL HIGH (ref 70–99)

## 2023-11-30 MED ORDER — BISACODYL 10 MG RE SUPP
10.0000 mg | Freq: Every day | RECTAL | 0 refills | Status: AC | PRN
Start: 2023-11-30 — End: ?

## 2023-11-30 NOTE — Care Management Important Message (Signed)
 Important Message  Patient Details  Name: Denise Parrish MRN: [id number] Date of Birth: [birth year]   Important Message Given:  Yes - Medicare IM     Corey Harold 11/30/2023, 11:52 AM

## 2023-11-30 NOTE — Progress Notes (Signed)
 [facility] Progress Note     Subjective: Patient seen and examined.  She is resting comfortably in chair at the side of the bed.  She tolerated her full liquids without nausea and vomiting.  She denies abdominal pain but does state her abdomen is still a little sore.  She confirms passing flatus and she continues to have bowel movements.  Objective: Vital signs in last 24 hours: Temp:  [97.8 F (36.6 C)-98.3 F (36.8 C)] 98.3 F (36.8 C) (03/14 0425) Pulse Rate:  [59-70] 59 (03/14 0425) Resp:  [18-20] 18 (03/14 0425) BP: (139-157)/(48-53) 139/50 (03/14 0425) SpO2:  [95 %-99 %] 95 % (03/14 0425) Last BM Date : 11/28/23  Intake/Output from previous day: 03/13 0701 - 03/14 0700 In: 740 [P.O.:740] Out: -  Intake/Output this shift: Total I/O In: 240 [P.O.:240] Out: -   General appearance: alert, cooperative, and no distress GI: Abdomen soft, mild distention, no percussion tenderness, nontender to palpation; no rigidity, guarding, rebound tenderness  Lab Results:  No results for input(s): "WBC", "HGB", "HCT", "PLT" in the last 72 hours. BMET No results for input(s): "NA", "K", "CL", "CO2", "GLUCOSE", "BUN", "CREATININE", "CALCIUM" in the last 72 hours. PT/INR No results for input(s): "LABPROT", "INR" in the last 72 hours.  Studies/Results: No results found.  Anti-infectives: Anti-infectives (From admission, onward)    None       Assessment/Plan:  Patient is a 89 year old female who was admitted with concern for small bowel obstruction secondary to small bowel wall thickening from enteritis, inflammation, or possible neoplasm  -Advance to GI soft -Patient continuing to have bowel function without nausea or vomiting -Continue current bowel regimen.  Advised that the patient should be taking a stool softener daily at home and use MiraLAX as needed for constipation -Continue to monitor bowel function -No acute surgical intervention at this time -If  patient tolerates her soft diet without nausea and vomiting, she is stable for discharge home from a general surgery standpoint -Advised that if patient is stable for discharge today, she can restart her Plavix tomorrow -Appreciate hospitalist recommendations. -Discussed with the patient if she wants further workup regarding the possible cause of this wall thickening, she should follow-up with the GI team for possible CT enterography   LOS: 4 days    Snow Peoples A Rashanda Magloire 11/30/2023  Note: Portions of this report may have been transcribed using voice recognition software. Every effort has been made to ensure accuracy; however, inadvertent computerized transcription errors may still be present.

## 2023-11-30 NOTE — Discharge Summary (Signed)
 Physician Discharge Summary   Patient: Denise Parrish MRN: [id number] DOB: [birth year]  Admit date:     11/26/2023  Discharge date: 11/30/23  Discharge Physician: Vassie Loll   PCP: Shelva Majestic T, DO   Recommendations at discharge:   Reassess blood pressure and adjust antihypertensive regimen Repeat basic metabolic panel out of electrolytes and renal function Continue further discussion with patient regarding pulmonary nodule and decide if further workup.  Discharge Diagnoses: Principal Problem:   Small bowel obstruction (HCC) Active Problems:   Gastroesophageal reflux disease   Essential hypertension   Esophagitis   Pulmonary nodule   Prolonged QT interval   Type 2 diabetes mellitus with hyperglycemia (HCC)   Abnormal CT of the abdomen  Brief Hospital admission: As per H&P written by Dr. Thomes Dinning on 11/26/23 Denise Parrish is a 89 y.o. female with medical history significant of hypertension, hyperlipidemia, GERD, CAD s/p stent placement, complete AV block status post pacemaker, severe aortic stenosis s/pTAVR who presents to the emergency department due to nausea and vomiting which started last night.  Patient thought this was due to gallstones, since she had a percutaneous drainage for 15 months when she was diagnosed with cholecystitis about 2 years ago.  This was removed about 3 months ago per ED medical record.  She denies chest pain, fever, chills, shortness of breath.  EMS was activated and she was sent to the ED for further evaluation and management.   ED Course:  In the emergency department, she was hemodynamically stable.  Workup in the ED showed normal CBC, BMP except for blood glucose of 157.  Urinalysis was normal.  Influenza A, B, SARS coronavirus 2, RSV was negative. CT abdomen and pelvis with contrast showed small bowel obstruction with transition point in the right lower quadrant where there is a short segment of small bowel wall thickening. Findings may be  related to infectious/inflammatory enteritis, however neoplasm is not excluded. Wall thickening of the distal esophagus worrisome for esophagitis. Patient was treated with Zofran and Phenergan.  IV hydration was provided, Reglan was also given.  NGT was placed. General surgery (Dr. Robyne Peers) was consulted and will follow-up with patient in the morning. Hospitalist was asked to admit patient for further evaluation and management.    Assessment and Plan: 1)SBO--??etiology..... Differential diagnosis include enteritis, inflammatory changes or possible neoplasm -Continue conservative management advised by GI and general surgery at this time -Patient and family also requesting conservative management at this time. -They may consider outpatient CT enterography as well as repeat patency and capsule endoscopy down the road if symptoms do not resolve. -Appreciate assistance and recommendation by GI and general surgery. -Patient has continued to move her bowels and having flatus. Diet advanced and tolerated. -patient advised to maintain adequate hydration   2) GERD/esophagitis -Continue PPI.   3)Pulmonary nodule -CT abdomen pelvis with contrast showed 0 x 7 mm ground-glass nodular density in the left lower lobe. -Patient is 89 years old, patient and family would like to defer further investigations at this time.   4)HTN--- metoprolol on hold  -resume home antihypertensive    5)DM2-.  A1c is 5.3 reflecting excellent diabetic control PTA -Continue to follow CBG fluctuation   -follow modified carb diet -resume home oral meds at discharge   6)H/o Prior CVA-- -continue plavix -Continue to follow further recommendation by general surgery.   7)CAD s/p stent placement, complete AV block status post pacemaker, severe aortic stenosis s/pTAVR--- -metoprolol and Plavix will be resumed -continue outpatient follow up  with cardiology as an outpatient.   Consultants: general surgery Procedures  performed:  see below for x-ray reports. Disposition: Home Diet recommendation: heart healthy diet  DISCHARGE MEDICATION: Allergies as of 11/30/2023   No Known Allergies      Medication List     STOP taking these medications    meclizine 12.5 MG tablet Commonly known as: ANTIVERT       TAKE these medications    acetaminophen 500 MG tablet Commonly known as: TYLENOL Take 500 mg by mouth every 8 (eight) hours as needed for mild pain.   ALPRAZolam 0.5 MG tablet Commonly known as: XANAX Take 0.5 mg by mouth at bedtime.   ascorbic acid 500 MG tablet Commonly known as: VITAMIN C Take 500 mg by mouth daily.   bisacodyl 10 MG suppository Commonly known as: DULCOLAX Place 1 suppository (10 mg total) rectally daily as needed for moderate constipation.   clopidogrel 75 MG tablet Commonly known as: PLAVIX TAKE ONE (1) TABLET BY MOUTH EVERY DAY WITH BREAKFAST   FeroSul 325 (65 Fe) MG tablet Generic drug: ferrous sulfate TAKE ONE TABLET (325MG  TOTAL) BY MOUTH DAILY   glimepiride 1 MG tablet Commonly known as: AMARYL Take 1 mg by mouth daily with breakfast.   metoprolol tartrate 25 MG tablet Commonly known as: LOPRESSOR Take 0.5 tablets (12.5 mg total) by mouth 2 (two) times daily.   multivitamin tablet Take 1 tablet by mouth daily.   pantoprazole 40 MG tablet Commonly known as: PROTONIX TAKE ONE TABLET (40 MG TOTAL) BY MOUTH ONCE DAILY.   PRESERVISION AREDS 2 PO Take 1 tablet by mouth daily. Takes 1 tablet in the morning and 1 tablet at night for eyes.   VITAMIN D3 PO Take 1 tablet by mouth daily.   zinc sulfate (50mg  elemental zinc) 220 (50 Zn) MG capsule Take 220 mg by mouth daily.        Follow-up Information     Shelva Majestic T, DO. Schedule an appointment as soon as possible for a visit in 10 day(s).   Specialty: Family Medicine Contact information: [address] [place] [address] [phone]                Discharge  Exam: Ceasar Mons Weights   11/26/23 1217 11/30/23 1500  Weight: 73.5 kg 73.5 kg   General exam: Alert, awake, oriented x 3; in no acute distress.  Reports continue moving her bowels and passing flatus. Has tolerated diet. Respiratory system: Good saturation on room air. Cardiovascular system:RRR. No rubs or gallops; no JVD. Gastrointestinal system: Abdomen is nondistended, soft and no tenderness on palpation.  Positive bowel sounds appreciated on exam. Central nervous system: Alert and oriented. No focal neurological deficits. Extremities: No cyanosis or clubbing. Skin: No petechiae. Psychiatry: Mood and affect appropriate.  Condition at discharge: stable and improved.  The results of significant diagnostics from this hospitalization (including imaging, microbiology, ancillary and laboratory) are listed below for reference.   Imaging Studies: DG Abd 1 View Result Date: 11/28/2023 CLINICAL DATA:  Small bowel obstruction. EXAM: ABDOMEN - 1 VIEW COMPARISON:  November 26, 2023. FINDINGS: Distal tip of nasogastric tube is seen in proximal stomach. Mildly dilated small bowel loops are noted concerning for ileus or distal small bowel obstruction. No colonic dilatation is noted. IMPRESSION: Mildly dilated small bowel loops are again noted concerning for ileus or distal small bowel obstruction. Electronically Signed   By: Lupita Raider M.D.   On: 11/28/2023 13:12  DG Abdomen 1 View Result Date: 11/26/2023 CLINICAL DATA:  NG tube placement. EXAM: ABDOMEN - 1 VIEW COMPARISON:  CT earlier today FINDINGS: Tip and side port of the enteric tube below the diaphragm in the stomach. Dilated small bowel in the left upper abdomen. Excreted IV contrast in the renal collecting system from earlier CT. IMPRESSION: Tip and side port of the enteric tube below the diaphragm in the stomach. Electronically Signed   By: Narda Rutherford M.D.   On: 11/26/2023 22:30   DG Chest Portable 1 View Result Date: 11/26/2023 CLINICAL  DATA:  Hypoxia.  Cough. EXAM: PORTABLE CHEST 1 VIEW COMPARISON:  05/18/2023 FINDINGS: Left-sided pacemaker in place. TAVR. Normal heart size with stable mediastinal contours. Aortic atherosclerosis. No focal airspace disease, pleural effusion, pulmonary edema or pneumothorax. No acute osseous findings. IMPRESSION: No acute findings. Electronically Signed   By: Narda Rutherford M.D.   On: 11/26/2023 22:29   CT ABDOMEN PELVIS W CONTRAST Result Date: 11/26/2023 CLINICAL DATA:  Nausea and vomiting. EXAM: CT ABDOMEN AND PELVIS WITH CONTRAST TECHNIQUE: Multidetector CT imaging of the abdomen and pelvis was performed using the standard protocol following bolus administration of intravenous contrast. RADIATION DOSE REDUCTION: This exam was performed according to the departmental dose-optimization program which includes automated exposure control, adjustment of the mA and/or kV according to patient size and/or use of iterative reconstruction technique. CONTRAST:  OMNIPAQUE IOHEXOL 300 MG/ML  SOLN COMPARISON:  CT abdomen and pelvis 05/18/2023 FINDINGS: Lower chest: Focal ground-glass nodular density in the left lower lobe measures 10 x 7 mm. There is atelectasis in the right lung base. Hepatobiliary: A large gallstone is present measuring 4.2 cm. There is no biliary ductal dilatation. The liver is within normal limits. Pancreas: Unremarkable. No pancreatic ductal dilatation or surrounding inflammatory changes. Spleen: Normal in size without focal abnormality. Adrenals/Urinary Tract: Mild left renal atrophy present. No hydronephrosis or perinephric fluid. There are bilateral renal cysts measuring up to 2 cm. Adrenal glands and bladder are within normal limits. Stomach/Bowel: No free air. There is a short segment of small bowel wall thickening in the right lower quadrant. This is transition point for small bowel obstruction. Proximal small bowel is mildly dilated with air-fluid levels. The stomach is mildly dilated  with air-fluid level. There is wall thickening of the distal esophagus. The appendix is within normal limits. There is sigmoid and descending colon diverticulosis. Vascular/Lymphatic: Aortic atherosclerosis. No enlarged abdominal or pelvic lymph nodes. Reproductive: Uterus and bilateral adnexa are unremarkable. Other: No abdominal wall hernia or abnormality. No abdominopelvic ascites. Musculoskeletal: Multilevel degenerative changes affect the spine. Right hip arthroplasty is present. There are degenerative changes of the left hip. IMPRESSION: 1. Small-bowel obstruction with transition point in the right lower quadrant where there is a short segment of small bowel wall thickening. Findings may be related to infectious/inflammatory enteritis, however neoplasm is not excluded. 2. Cholelithiasis. 3. Colonic diverticulosis. 4. Wall thickening of the distal esophagus worrisome for esophagitis. 5. 10 x 7 mm ground-glass nodular density in the left lower lobe. Initial follow-up with CT at 6 months is recommended to confirm persistence. If persistent, repeat CT is recommended every 2 years until 5 years of stability has been established. This recommendation follows the consensus statement: Guidelines for Management of Incidental Pulmonary Nodules Detected on CT Images: From the Fleischner Society 2017; Radiology 2017; 284:228-243. 6. Bosniak I benign renal cyst measuring 2 cm. No follow-up imaging is recommended. JACR 2018 Feb; 264-273, Management of the Incidental Renal Mass on  CT, [employer] 2021; 814-848, Bosniak Classification of Cystic Renal Masses, Version 2019. Aortic Atherosclerosis (ICD10-I70.0). Electronically Signed   By: Darliss Cheney M.D.   On: 11/26/2023 20:40   US Abdomen Limited RUQ (LIVER/GB) Result Date: 11/26/2023 CLINICAL DATA:  Severe nausea and vomiting since last night. EXAM: ULTRASOUND ABDOMEN LIMITED RIGHT UPPER QUADRANT COMPARISON:  CT abdomen pelvis 05/18/2023. FINDINGS: Gallbladder:  Shadowing echogenic stones measure up to 1.4 cm and do not appear mobile. No wall thickening. Negative sonographic Murphy sign. Common bile duct: Diameter: 6 mm, within normal limits for age. No intrahepatic biliary ductal dilatation. Liver: No focal lesion identified. Within normal limits in parenchymal echogenicity. Portal vein is patent on color Doppler imaging with normal direction of blood flow towards the liver. Other: Incidental imaging of the right kidney shows fullness of the right renal pelvis which likely corresponds to a prominent extrarenal pelvis as on 05/18/2023. IMPRESSION: Cholelithiasis.  No acute findings. Electronically Signed   By: Leanna Battles M.D.   On: 11/26/2023 16:10    Microbiology: Results for orders placed or performed during the hospital encounter of 11/26/23  Resp panel by RT-PCR (RSV, Flu A&B, Covid) Anterior Nasal Swab     Status: None   Collection Time: 11/26/23 10:00 PM   Specimen: Anterior Nasal Swab  Result Value Ref Range Status   SARS Coronavirus 2 by RT PCR NEGATIVE NEGATIVE Final    Comment: (NOTE) SARS-CoV-2 target nucleic acids are NOT DETECTED.  The SARS-CoV-2 RNA is generally detectable in upper respiratory specimens during the acute phase of infection. The lowest concentration of SARS-CoV-2 viral copies this assay can detect is 138 copies/mL. A negative result does not preclude SARS-Cov-2 infection and should not be used as the sole basis for treatment or other patient management decisions. A negative result may occur with  improper specimen collection/handling, submission of specimen other than nasopharyngeal swab, presence of viral mutation(s) within the areas targeted by this assay, and inadequate number of viral copies(<138 copies/mL). A negative result must be combined with clinical observations, patient history, and epidemiological information. The expected result is Negative.  Fact Sheet for Patients:   BloggerCourse.com  Fact Sheet for Healthcare Providers:  SeriousBroker.it  This test is no t yet approved or cleared by the [place] FDA and  has been authorized for detection and/or diagnosis of SARS-CoV-2 by FDA under an Emergency Use Authorization (EUA). This EUA will remain  in effect (meaning this test can be used) for the duration of the COVID-19 declaration under Section 564(b)(1) of the Act, 21 U.S.C.section 360bbb-3(b)(1), unless the authorization is terminated  or revoked sooner.       Influenza A by PCR NEGATIVE NEGATIVE Final   Influenza B by PCR NEGATIVE NEGATIVE Final    Comment: (NOTE) The Xpert Xpress SARS-CoV-2/FLU/RSV plus assay is intended as an aid in the diagnosis of influenza from Nasopharyngeal swab specimens and should not be used as a sole basis for treatment. Nasal washings and aspirates are unacceptable for Xpert Xpress SARS-CoV-2/FLU/RSV testing.  Fact Sheet for Patients: BloggerCourse.com  Fact Sheet for Healthcare Providers: SeriousBroker.it  This test is not yet approved or cleared by the [place] FDA and has been authorized for detection and/or diagnosis of SARS-CoV-2 by FDA under an Emergency Use Authorization (EUA). This EUA will remain in effect (meaning this test can be used) for the duration of the COVID-19 declaration under Section 564(b)(1) of the Act, 21 U.S.C. section 360bbb-3(b)(1), unless the authorization is terminated or revoked.  Resp Syncytial Virus by PCR NEGATIVE NEGATIVE Final    Comment: (NOTE) Fact Sheet for Patients: BloggerCourse.com  Fact Sheet for Healthcare Providers: SeriousBroker.it  This test is not yet approved or cleared by the [place] FDA and has been authorized for detection and/or diagnosis of SARS-CoV-2 by FDA under an Emergency Use  Authorization (EUA). This EUA will remain in effect (meaning this test can be used) for the duration of the COVID-19 declaration under Section 564(b)(1) of the Act, 21 U.S.C. section 360bbb-3(b)(1), unless the authorization is terminated or revoked.  Performed at [facility], [address] [place], [place] [address]     Labs: CBC: Recent Labs  Lab 11/26/23 1230 11/27/23 0441  WBC 10.3 10.7*  NEUTROABS 8.4*  --   HGB 13.8 13.3  HCT 40.6 40.2  MCV 92.3 94.8  PLT 223 224   Basic Metabolic Panel: Recent Labs  Lab 11/26/23 1230 11/27/23 0441  NA 138 139  K 3.9 3.6  CL 100 102  CO2 25 29  GLUCOSE 157* 160*  BUN 21 18  CREATININE 0.98 1.12*  CALCIUM 9.7 8.7*  MG  --  2.1  PHOS  --  4.9*   Liver Function Tests: Recent Labs  Lab 11/26/23 1230 11/27/23 0441  AST 33 31  ALT 26 28  ALKPHOS 68 68  BILITOT 1.1 1.3*  PROT 6.9 6.4*  ALBUMIN 3.7 3.3*   CBG: Recent Labs  Lab 11/29/23 1944 11/29/23 2331 11/30/23 0424 11/30/23 0744 11/30/23 1121  GLUCAP 121* 90 101* 106* 129*    Discharge time spent: greater than 30 minutes.  Signed: Vassie Loll, MD [facility] 11/30/2023

## 2023-11-30 NOTE — Progress Notes (Signed)
 Mobility Specialist Progress Note:    11/30/23 0915  Mobility  Activity Ambulated independently in room  Level of Assistance Standby assist, set-up cues, supervision of patient - no hands on  Assistive Device None  Distance Ambulated (ft) 30 ft  Range of Motion/Exercises Active;All extremities  Activity Response Tolerated well  Mobility Referral Yes  Mobility visit 1 Mobility  Mobility Specialist Start Time (ACUTE ONLY) 0915  Mobility Specialist Stop Time (ACUTE ONLY) 0930  Mobility Specialist Time Calculation (min) (ACUTE ONLY) 15 min   Pt received in chair, agreeable to mobility. Required supervision to stand and ambulate with no AD. Tolerated well, asx throughout. Returned pt to chair, alarm on. Call bell in reach, all needs met.  Enzley Kitchens Mobility Specialist Please contact via Special educational needs teacher or  Rehab office at [phone]

## 2023-11-30 NOTE — Progress Notes (Signed)
 Patient has rested well this shift. Patient has had no complaints of pain or nausea. Vitals have been stable this shift.

## 2023-12-07 ENCOUNTER — Other Ambulatory Visit: Payer: Self-pay | Admitting: Gastroenterology

## 2023-12-07 DIAGNOSIS — D509 Iron deficiency anemia, unspecified: Secondary | ICD-10-CM

## 2024-01-09 ENCOUNTER — Ambulatory Visit (INDEPENDENT_AMBULATORY_CARE_PROVIDER_SITE_OTHER): Payer: Medicare Other

## 2024-01-09 DIAGNOSIS — I442 Atrioventricular block, complete: Secondary | ICD-10-CM | POA: Diagnosis not present

## 2024-01-10 LAB — CUP PACEART REMOTE DEVICE CHECK
Date Time Interrogation Session: 20250423074527
Implantable Lead Connection Status: 753985
Implantable Lead Connection Status: 753985
Implantable Lead Implant Date: 20221026
Implantable Lead Implant Date: 20221026
Implantable Lead Location: 753859
Implantable Lead Location: 753860
Implantable Lead Model: 377171
Implantable Lead Model: 377171
Implantable Lead Serial Number: 8000595070
Implantable Lead Serial Number: 8000606642
Implantable Pulse Generator Implant Date: 20221026
Pulse Gen Model: 407145
Pulse Gen Serial Number: 70223175

## 2024-02-20 NOTE — Progress Notes (Signed)
 Remote pacemaker transmission.

## 2024-02-20 NOTE — Addendum Note (Signed)
 Addended by: Lott Rouleau A on: 02/20/2024 04:04 PM   Modules accepted: Orders

## 2024-04-09 ENCOUNTER — Ambulatory Visit (INDEPENDENT_AMBULATORY_CARE_PROVIDER_SITE_OTHER): Payer: Medicare Other

## 2024-04-09 DIAGNOSIS — I442 Atrioventricular block, complete: Secondary | ICD-10-CM | POA: Diagnosis not present

## 2024-04-10 ENCOUNTER — Ambulatory Visit: Payer: Self-pay | Admitting: Cardiology

## 2024-04-10 ENCOUNTER — Ambulatory Visit: Admitting: Cardiovascular Disease

## 2024-04-10 LAB — CUP PACEART REMOTE DEVICE CHECK
Battery Voltage: 75
Date Time Interrogation Session: 20250723094335
Implantable Lead Connection Status: 753985
Implantable Lead Connection Status: 753985
Implantable Lead Implant Date: 20221026
Implantable Lead Implant Date: 20221026
Implantable Lead Location: 753859
Implantable Lead Location: 753860
Implantable Lead Model: 377171
Implantable Lead Model: 377171
Implantable Lead Serial Number: 8000595070
Implantable Lead Serial Number: 8000606642
Implantable Pulse Generator Implant Date: 20221026
Pulse Gen Model: 407145
Pulse Gen Serial Number: 70223175

## 2024-06-20 NOTE — Progress Notes (Signed)
 Remote PPM Transmission

## 2024-06-27 NOTE — Progress Notes (Signed)
 [facility]                                        Cardiology Office Note:    Date:  07/04/2024   ID:  Denise Parrish, DOB October 26, 1922, MRN [id number]  PCP:  Denise Arthea DASEN, DO (Inactive)  [facility] Cardiologist:  Denise Emmer, MD /Dr. Wonda & Dr. Lucas (TAVR) [facility] Electrophysiologist:  OLE Parrish HOLTS, MD     History of Present Illness:    Denise Parrish is a 89 y.o. female with a hx of mild dementia, DMT2, GERD, HLD, HTN, obesity, emphysema, hiatal hernia, history of TIA/CVA, carotid artery disease s/p right CEA 10/2019, history of GI bleeding, severe AS s/p TAVR (09/28/20) and symptomatic CHB s/p PPM (06/2021) who presents to clinic for follow up.     She had a difficult hospitalization in August 2021 when she was admitted with a GI bleed and demand ischemia and suffered a mechanical fall in the hospital with fracture of her left fibula. She then reported progressive exertional shortness of breath and fatigue and was diagnosed with severe AS. During her work-up for aortic stenosis and consideration of TAVR she underwent cardiac catheterization on 09/08/2020 which showed a 90% left circumflex stenosis and a large caliber vessel.  This was successfully treated with drug-eluting stent, which led to an improvement in symptoms. Catheterization also showed a 60 to 70% distal RCA stenosis and mild nonobstructive LAD disease.  The mean gradient across aortic valve at catheterization was 57 mmHg with a valve area of 0.78 cm2.  She was placed on plavix  monotherapy given recent GI bleed.      She was evaluated by the multidisciplinary valve team and underwent successful TAVR with a 23 mm Medtronic Evolut Pro+ THV via the TF approach on 09/28/20. Post operative echo showed normally functioning TAVR with a mean gradient of 11 mm hg and no PVL. Shortly after TAVR she developed acute right limb ischemia that required emergent exploration and primary repair right femoral  artery. She was discharged on POD 2 on monotherapy with plavix . 1 month echo showed EF 70%, normally functioning TAVR with a mean gradient of 8.3 and no PVL.     She was admitted in 11/2020 for acute GI bleed requiring transfusion. She underwent extensive GI work up and Plavix  was held but ultimately restarted.     She was readmitted in 06/2021 for recurrent weak spells, near syncope, observed to have intermittent CHB with pauses. She underwent successful implantation of a Biotronik dual chamber PPM on 07/12/21 by Dr Parrish. Last device check with no arrhthymias and normal function.      She lives alone although her daughter is close by to check on her. She remains very active at baseline. She denies chest pain, palpitations, LE edema, orthopnea, dizziness, or syncope.   Echocardiogram 11/18/21  shows an LVEF at 60 to 65% with G1DD with a 23 mm CoreValve-Evolut Pro prosthetic (TAVR) valve present in the aortic position. Mean gradient 8.6 mmhg no PVL  She is more active Daughter takes her to two [employer] games/week in season, and she has MLB package at home to watch baseball games Routing for [employer] this year Active going to [place] this weekend   Past Medical History:  Diagnosis Date   Anxiety    CAD (coronary artery disease)    a. DES placement to  the LCx in 08/2020 and medical management was recommended of her residual disease   Carotid stenosis, right 10/16/2019   75% on CTA neck   Cholelithiasis    on u/s 09/2011   Degenerative joint disease    Right THA in 1995; cervical discectomy and fusion-2001   Diabetes mellitus, type 2 (HCC)    + neuropathy   Emphysema    Gastroesophageal reflux disease    Hiatal hernia    History of CVA (cerebrovascular accident)    Hyperlipidemia    Hypertension    IDA (iron deficiency anemia)    labs 09/2011   Mild cognitive impairment    Obesity    Osteoporosis    Pneumonia    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   s/p TAVR  with a 23mm Medtronic Evolut Pro+ via the TF approach    Severe aortic stenosis    Sigmoid diverticulosis    Small bowel lesion    On Given's capsule; Dr Myriam Dess 07/2012, no further w/u needed    Past Surgical History:  Procedure Laterality Date   ANTERIOR CERVICAL DISCECTOMY  2001   C4-5, allograft, fixation   BREAST LUMPECTOMY     benign   CATARACT EXTRACTION W/ INTRAOCULAR LENS IMPLANT  2007   Left   COLONOSCOPY  2006   COLONOSCOPY  04/03/2012   Dr. Ricki diverticulosis, negative microscopic  colitis    COLONOSCOPY WITH PROPOFOL  N/A 11/27/2020   Surgeon: Denise Angelia Sieving, MD;  diverticulosis, no active bleeding.  Two 2-4 mm polyps not removed in the setting of Plavix .   CORONARY STENT INTERVENTION N/A 09/08/2020   Procedure: CORONARY STENT INTERVENTION;  Surgeon: Denise Sharper, MD;  Location: [facility] INVASIVE CV LAB;  Service: Cardiovascular;  Laterality: N/A;   ENDARTERECTOMY Right 10/21/2019   Procedure: ENDARTERECTOMY CAROTID RIGHT;  Surgeon: Sheree Penne Bruckner, MD;  Location: [facility] OR;  Service: Vascular;  Laterality: Right;   ESOPHAGOGASTRODUODENOSCOPY  04/03/2012   Dr. Sarita hernia, chronic gastritis on bx   ESOPHAGOGASTRODUODENOSCOPY (EGD) WITH PROPOFOL  N/A 04/28/2020   Procedure: ESOPHAGOGASTRODUODENOSCOPY (EGD) WITH PROPOFOL ;  Surgeon: Denise Claw, MD;  Location: [facility] ENDOSCOPY;  Service: Gastroenterology;  Laterality: N/A;   ESOPHAGOGASTRODUODENOSCOPY (EGD) WITH PROPOFOL  N/A 11/27/2020   Surgeon: Denise Angelia, Sieving, MD; findings suggestive of short segment Barrett's, normal stomach, and small bowel.   FEMORAL ARTERY EXPLORATION Right 09/28/2020   Procedure: RIGHT GROIN EXPLORATION WITH REPAIR RIGHT COMMON FEMORAL ARTERY;  Surgeon: Denise Krystal FALCON, MD;  Location: [facility] OR;  Service: Vascular;  Laterality: Right;   GIVENS CAPSULE STUDY  05/09/2012   RMR: an unclear raised area of small bowel was noted, with features almost   characteristic of very small polyp. This was without villous  blunting or any evidence of active bleeding; yet, the area of  concern appeared to be erythematous. However, this could simply  be a light reflection on a normal variation of the small bowel. REFERRED TO DR. GILLIAM, APPT FOR NOVEMBER 2013.    GIVENS CAPSULE STUDY N/A 04/30/2020   Procedure: GIVENS CAPSULE STUDY;  Surgeon: Denise Claw, MD;  Location: [facility];  Service: Gastroenterology;  Laterality: N/A;   GIVENS CAPSULE STUDY  11/27/2020    Surgeon: Denise Angelia, Sieving, MD;  questionable nodule/small mass in the distal small bowel but without ulceration.  Evidence of hematochezia throughout the colon but no fresh blood.    HIP ARTHROPLASTY Right    INSERT / REPLACE / REMOVE PACEMAKER     IR CHOLANGIOGRAM EXISTING TUBE  07/13/2022   IR REMOVAL BILIARY DRAIN  08/09/2022   PACEMAKER IMPLANT N/A 07/12/2021   Procedure: PACEMAKER IMPLANT;  Surgeon: Cindie Ole DASEN, MD;  Location: [facility] INVASIVE CV LAB;  Service: Cardiovascular;  Laterality: N/A;   PATCH ANGIOPLASTY Right 10/21/2019   Procedure: PATCH ANGIOPLASTY USING GEORGE BIOLOGIC PATCH;  Surgeon: Sheree Penne Bruckner, MD;  Location: [facility] OR;  Service: Vascular;  Laterality: Right;   POLYPECTOMY  04/28/2020   Procedure: POLYPECTOMY;  Surgeon: Denise Claw, MD;  Location: [facility];  Service: Gastroenterology;;   RIGHT/LEFT HEART CATH AND CORONARY ANGIOGRAPHY N/A 09/08/2020   Procedure: RIGHT/LEFT HEART CATH AND CORONARY ANGIOGRAPHY;  Surgeon: Denise Sharper, MD;  Location: [facility] INVASIVE CV LAB;  Service: Cardiovascular;  Laterality: N/A;   TEE WITHOUT CARDIOVERSION N/A 09/28/2020   Procedure: TRANSESOPHAGEAL ECHOCARDIOGRAM (TEE);  Surgeon: Denise Sharper, MD;  Location: [facility] OR;  Service: Open Heart Surgery;  Laterality: N/A;   TONSILLECTOMY     TOTAL HIP ARTHROPLASTY  1995   Right   TRANSCATHETER AORTIC VALVE REPLACEMENT, TRANSFEMORAL N/A 09/28/2020    Procedure: TRANSCATHETER AORTIC VALVE REPLACEMENT, TRANSFEMORAL;  Surgeon: Denise Sharper, MD;  Location: [facility] OR;  Service: Open Heart Surgery;  Laterality: N/A;    Current Medications: Current Meds  Medication Sig   acetaminophen  (TYLENOL ) 500 MG tablet Take 500 mg by mouth every 8 (eight) hours as needed for mild pain.   ALPRAZolam  (XANAX ) 0.5 MG tablet Take 0.5 mg by mouth at bedtime.   bisacodyl  (DULCOLAX) 10 MG suppository Place 1 suppository (10 mg total) rectally daily as needed for moderate constipation.   Cholecalciferol  (VITAMIN D3 PO) Take 1 tablet by mouth daily.   clopidogrel  (PLAVIX ) 75 MG tablet TAKE ONE (1) TABLET BY MOUTH EVERY DAY WITH BREAKFAST   FEROSUL 325 (65 Fe) MG tablet TAKE ONE TABLET (325MG  TOTAL) BY MOUTH DAILY   glimepiride  (AMARYL ) 1 MG tablet Take 1 mg by mouth daily with breakfast.   metoprolol  tartrate (LOPRESSOR ) 25 MG tablet Take 0.5 tablets (12.5 mg total) by mouth 2 (two) times daily.   Multiple Vitamin (MULTIVITAMIN) tablet Take 1 tablet by mouth daily.   Multiple Vitamins-Minerals (PRESERVISION AREDS 2 PO) Take 1 tablet by mouth daily. Takes 1 tablet in the morning and 1 tablet at night for eyes.   pantoprazole  (PROTONIX ) 40 MG tablet TAKE ONE TABLET (40 MG TOTAL) BY MOUTH ONCE DAILY.   vitamin C (ASCORBIC ACID) 500 MG tablet Take 500 mg by mouth daily.   zinc sulfate, 50mg  elemental zinc, 220 (50 Zn) MG capsule Take 220 mg by mouth daily.     Allergies:   Patient has no known allergies.   Social History   Socioeconomic History   Marital status: Divorced    Spouse name: Not on file   Number of children: 4   Years of education: 6th grade   Highest education level: Not on file  Occupational History   Occupation: Retired    Comment: [employer]  Tobacco Use   Smoking status: Never    Passive exposure: Never   Smokeless tobacco: Never   Tobacco comments:    Quit x 50 years  Vaping Use   Vaping status: Never Used  Substance and Sexual Activity    Alcohol use: Never   Drug use: Never   Sexual activity: Not Currently  Other Topics Concern   Not on file  Social History Narrative   Lives alone. Every other week she has family with her. Daughter and son take care of her during  the days.    Right-handed.   No daily use of caffeine.             Social Drivers of Corporate investment banker Strain: Not on file  Food Insecurity: No Food Insecurity (11/26/2023)   Hunger Vital Sign    Worried About Running Out of Food in the Last Year: Never true    Ran Out of Food in the Last Year: Never true  Transportation Needs: No Transportation Needs (11/26/2023)   PRAPARE - Administrator, Civil Service (Medical): No    Lack of Transportation (Non-Medical): No  Physical Activity: Not on file  Stress: Not on file  Social Connections: Moderately Isolated (11/26/2023)   Social Connection and Isolation Panel    Frequency of Communication with Friends and Family: More than three times a week    Frequency of Social Gatherings with Friends and Family: More than three times a week    Attends Religious Services: More than 4 times per year    Active Member of [employer] or Organizations: No    Attends Banker Meetings: Never    Marital Status: Widowed     Family History: The patient's family history includes CAD in her father; Cancer in her brother; Cirrhosis in her mother; Diabetes in her mother; Heart failure in her father; Hypertension in her father; Uterine cancer in her sister. There is no history of Colon cancer.  ROS:   Please see the history of present illness.    All other systems reviewed and are negative.  EKGs/Labs/Other Studies Reviewed:    The following studies were reviewed today:  Echocardiogram 11/18/21:   1. Compared to 07/12/21.   2. Left ventricular ejection fraction, by estimation, is 60 to 65%. The  left ventricle has normal function. The left ventricle has no regional  wall motion abnormalities. Left  ventricular diastolic parameters are  consistent with Grade I diastolic  dysfunction (impaired relaxation). Elevated left atrial pressure.   3. Right ventricular systolic function is normal. The right ventricular  size is normal.   4. Left atrial size was mildly dilated.   5. The mitral valve is normal in structure. Mild mitral valve  regurgitation. Moderate mitral stenosis. Moderate mitral annular  calcification.   6. The aortic valve has been repaired/replaced. Aortic valve  regurgitation is not visualized. No aortic stenosis is present. There is a  23 mm CoreValve-Evolut Pro prosthetic (TAVR) valve present in the aortic  position. Procedure Date: 09/28/20. Echo  findings are consistent with normal structure and function of the aortic  valve prosthesis.   7. The inferior vena cava is normal in size with greater than 50%  respiratory variability, suggesting right atrial pressure of 3 mmHg.   Operateive NOTE     Date of Procedure:                 09/28/2020   Preoperative Diagnosis:      Severe Aortic Stenosis    Postoperative Diagnosis:    Same    Procedure:        Transcatheter Aortic Valve Replacement - Percutaneous Right Transfemoral Approach             Medtronic Evolut-PRO+  (size 23 mm, model # EVPROPLUS-23US, serial # [id number])              Co-Surgeons:            Dorise LOIS Fellers, MD and Ozell Fell, MD  Anesthesiologist:                  KYM Mace, MD   Echocardiographer:              LOIS Moose, MD   Pre-operative Echo Findings: Severe aortic stenosis   Normal left ventricular systolic function   Post-operative Echo Findings: No paravalvular leak Normal left ventricular systolic function Normal mean prosthetic transvalvular gradient of 16 mm Hg.   ________________________   Echo 09/29/20:  IMPRESSIONS   1. The mitral valve is degenerative. Mild mitral valve regurgitation.  Moderate mitral stenosis. Severe mitral annular calcification.   2. Post TAVR  with 23 mm supra annular Medtronic Evolut valve No  significant PVL mean gradient 11 peak 19 mmHg lower than implant DVI 0.69  and AVA 2.1 cm2. The aortic valve has been repaired/replaced.   3. There is severely elevated pulmonary artery systolic pressure.   4. The inferior vena cava is dilated in size with >50% respiratory  variability, suggesting right atrial pressure of 8 mmHg.  ____________________   Echo 10/25/20 IMPRESSIONS   1. Left ventricular ejection fraction, by estimation, is 70 to 75%. The  left ventricle has hyperdynamic function. The left ventricle has no  regional wall motion abnormalities. There is mild left ventricular  hypertrophy. Left ventricular diastolic  parameters are consistent with Grade I diastolic dysfunction (impaired  relaxation). Elevated left atrial pressure.   2. Right ventricular systolic function is normal. The right ventricular  size is normal. There is mildly elevated pulmonary artery systolic  pressure.   3. Left atrial size was mildly dilated.   4. Right atrial size was mildly dilated.   5. The mitral valve is abnormal. No evidence of mitral valve  regurgitation. Mild mitral stenosis.   6. Medtronic Evolut-PRO+ size 23 mm valve is in the AV position. The  prosthetic valve has normal function. . The aortic valve has been  repaired/replaced. Aortic valve regurgitation is not visualized. No aortic  stenosis is present.   7. The inferior vena cava is normal in size with greater than 50%  respiratory variability, suggesting right atrial pressure of 3 mmHg.   EKG:  07/04/2024 AV pacing rate 61 LAD   Recent Labs: 11/27/2023: ALT 28; BUN 18; Creatinine, Ser 1.12; Hemoglobin 13.3; Magnesium  2.1; Platelets 224; Potassium 3.6; Sodium 139   Recent Lipid Panel    Component Value Date/Time   CHOL 100 04/08/2020 0441   TRIG 83 04/08/2020 0441   HDL 35 (L) 04/08/2020 0441   CHOLHDL 2.9 04/08/2020 0441   VLDL 17 04/08/2020 0441   LDLCALC 48 04/08/2020  0441   Physical Exam:    VS:  BP 138/60   Pulse 74   Ht 5' 5 (1.651 m)   Wt 163 lb 3.2 oz (74 kg)   SpO2 97%   BMI 27.16 kg/m     Wt Readings from Last 3 Encounters:  07/04/24 163 lb 3.2 oz (74 kg)  11/30/23 162 lb 0.6 oz (73.5 kg)  10/18/23 162 lb (73.5 kg)    Elderly female Hard of hearing with aids in place  Prior right CEA Lungs clear SEM through TAVR valve no AR PPM under left clavicle Trace edema Good pedal pulses Benign abdomen Non focal neuro  ASSESSMENT/PLAN:    Severe AS s/p TAVR:Continue plavix  Normal function by TTE  11/18/21 with no PVL and mean gradient 8.6 mmHg SBE prophylaxis    CAD: pre TAVR cath showed severe 90% left circumflex stenosis (  large caliber vessel) with successful PCI using a 3.5 x 12 mm resolute Onyx DES. There was moderate to severe distal RCA stenosis of 60 to 70% and mild nonobstructive LAD stenosis. Continue plavix      Hx of GI bleeding with anemia: No recent recurrence. Last Hct in Epic 40.2 on 11/27/23   Paroxsymal AV block: s/p PPM placement with Biotronik dual chamber PPM on 07/12/21 by Dr Cindie. Last interrogation with normal device functioning. 01/10/23 PACEART reviewed   PV:  prior Right CEA 10/21/19 Duplex 11/13/22 plaque no residual stenosis She follows with Dr Serene  PAF:  device indicates very low burden. Given age, vertigo with falls and prior GI bleed have not pursued [facility] Rx    [place] F/U me in a year   Signed, Denise Emmer, MD  07/04/2024 2:18 PM    [facility]

## 2024-07-04 ENCOUNTER — Encounter: Payer: Self-pay | Admitting: Cardiovascular Disease

## 2024-07-04 ENCOUNTER — Ambulatory Visit: Attending: Cardiovascular Disease | Admitting: Cardiovascular Disease

## 2024-07-04 VITALS — BP 138/60 | HR 74 | Ht 65.0 in | Wt 163.2 lb

## 2024-07-04 DIAGNOSIS — Z950 Presence of cardiac pacemaker: Secondary | ICD-10-CM

## 2024-07-04 DIAGNOSIS — I25810 Atherosclerosis of coronary artery bypass graft(s) without angina pectoris: Secondary | ICD-10-CM

## 2024-07-04 DIAGNOSIS — Z9889 Other specified postprocedural states: Secondary | ICD-10-CM

## 2024-07-04 DIAGNOSIS — Z952 Presence of prosthetic heart valve: Secondary | ICD-10-CM

## 2024-07-04 NOTE — Patient Instructions (Signed)
 Medication Instructions:  Your physician recommends that you continue on your current medications as directed. Please refer to the Current Medication list given to you today.   Labwork: None today  Testing/Procedures: None today  Follow-Up: 6 months  Any Other Special Instructions Will Be Listed Below (If Applicable).  If you need a refill on your cardiac medications before your next appointment, please call your pharmacy.

## 2024-07-09 ENCOUNTER — Ambulatory Visit: Payer: Medicare Other

## 2024-07-09 DIAGNOSIS — I442 Atrioventricular block, complete: Secondary | ICD-10-CM | POA: Diagnosis not present

## 2024-07-10 LAB — CUP PACEART REMOTE DEVICE CHECK
Date Time Interrogation Session: 20251022081021
Implantable Lead Connection Status: 753985
Implantable Lead Connection Status: 753985
Implantable Lead Implant Date: 20221026
Implantable Lead Implant Date: 20221026
Implantable Lead Location: 753859
Implantable Lead Location: 753860
Implantable Lead Model: 377171
Implantable Lead Model: 377171
Implantable Lead Serial Number: 8000595070
Implantable Lead Serial Number: 8000606642
Implantable Pulse Generator Implant Date: 20221026
Pulse Gen Model: 407145
Pulse Gen Serial Number: 70223175

## 2024-07-11 NOTE — Progress Notes (Signed)
 Remote PPM Transmission

## 2024-07-14 ENCOUNTER — Ambulatory Visit: Payer: Self-pay | Admitting: Cardiology

## 2024-07-17 ENCOUNTER — Other Ambulatory Visit: Payer: Self-pay | Admitting: Gastroenterology

## 2024-07-17 DIAGNOSIS — D509 Iron deficiency anemia, unspecified: Secondary | ICD-10-CM

## 2024-07-21 ENCOUNTER — Other Ambulatory Visit: Payer: Self-pay | Admitting: Cardiology

## 2024-07-22 MED ORDER — CLOPIDOGREL BISULFATE 75 MG PO TABS: 75.0000 mg | ORAL_TABLET | Freq: Every day | ORAL | 0 refills | Status: DC

## 2024-08-24 ENCOUNTER — Emergency Department (HOSPITAL_COMMUNITY)

## 2024-08-24 ENCOUNTER — Encounter (HOSPITAL_COMMUNITY): Payer: Self-pay

## 2024-08-24 ENCOUNTER — Other Ambulatory Visit: Payer: Self-pay

## 2024-08-24 ENCOUNTER — Emergency Department (HOSPITAL_COMMUNITY)
Admission: EM | Admit: 2024-08-24 | Discharge: 2024-08-24 | Disposition: A | Discharge status: Home / Self Care | Attending: Emergency Medicine | Admitting: Emergency Medicine

## 2024-08-24 DIAGNOSIS — R0981 Nasal congestion: Secondary | ICD-10-CM | POA: Insufficient documentation

## 2024-08-24 DIAGNOSIS — J0101 Acute recurrent maxillary sinusitis: Secondary | ICD-10-CM

## 2024-08-24 LAB — COMPREHENSIVE METABOLIC PANEL WITH GFR
ALT: 39 U/L (ref 0–44)
AST: 41 U/L (ref 15–41)
Albumin: 3.6 g/dL (ref 3.5–5.0)
Alkaline Phosphatase: 99 U/L (ref 38–126)
Anion gap: 14 (ref 5–15)
BUN: 19 mg/dL (ref 8–23)
CO2: 22 mmol/L (ref 22–32)
Calcium: 9.3 mg/dL (ref 8.9–10.3)
Chloride: 98 mmol/L (ref 98–111)
Creatinine, Ser: 0.87 mg/dL (ref 0.44–1.00)
GFR, Estimated: 60 mL/min — ABNORMAL LOW (ref >60)
Glucose, Bld: 129 mg/dL — ABNORMAL HIGH (ref 70–99)
Potassium: 4.4 mmol/L (ref 3.5–5.1)
Sodium: 134 mmol/L — ABNORMAL LOW (ref 135–145)
Total Bilirubin: 0.9 mg/dL (ref 0.0–1.2)
Total Protein: 7.3 g/dL (ref 6.5–8.1)

## 2024-08-24 LAB — RESP PANEL BY RT-PCR (RSV, FLU A&B, COVID)  RVPGX2
Influenza A by PCR: NEGATIVE
Influenza B by PCR: NEGATIVE
Resp Syncytial Virus by PCR: NEGATIVE
SARS Coronavirus 2 by RT PCR: NEGATIVE

## 2024-08-24 LAB — CBC
HCT: 37.7 % (ref 36.0–46.0)
Hemoglobin: 12.6 g/dL (ref 12.0–15.0)
MCH: 31 pg (ref 26.0–34.0)
MCHC: 33.4 g/dL (ref 30.0–36.0)
MCV: 92.6 fL (ref 80.0–100.0)
Platelets: 260 K/uL (ref 150–400)
RBC: 4.07 MIL/uL (ref 3.87–5.11)
RDW: 12.5 % (ref 11.5–15.5)
WBC: 10.2 K/uL (ref 4.0–10.5)
nRBC: 0 % (ref 0.0–0.2)

## 2024-08-24 LAB — CBG MONITORING, ED: Glucose-Capillary: 134 mg/dL — ABNORMAL HIGH (ref 70–99)

## 2024-08-24 MED ORDER — AMOXICILLIN-POT CLAVULANATE 875-125 MG PO TABS: 1.0000 | ORAL_TABLET | Freq: Two times a day (BID) | ORAL | 0 refills | Status: AC

## 2024-08-24 MED ORDER — AMOXICILLIN-POT CLAVULANATE 875-125 MG PO TABS
1.0000 | ORAL_TABLET | Freq: Once | ORAL | Status: AC
  Administered 2024-08-24: 1 via ORAL
  Filled 2024-08-24: qty 1

## 2024-08-24 NOTE — ED Triage Notes (Signed)
 Patient BIB [facility] for complaint of generalized weakness and decreased intake in the past 2 days, has been drinking ensure and having a stuffy nose and cough that has been going out for a week.

## 2024-08-24 NOTE — Discharge Instructions (Signed)
 YOur lungs are normal - no pneumonia You probably have a sinus infection  Augmentin  twice daily for 7 days

## 2024-08-24 NOTE — ED Notes (Signed)
 ED Provider at bedside.

## 2024-08-24 NOTE — ED Notes (Signed)
 Spoke with Patients daughter Cy. Updated on patient is now up for D/C. Daughter informed this nurse they are on the way.

## 2024-08-24 NOTE — ED Provider Notes (Signed)
 [place] EMERGENCY DEPARTMENT AT [facility] Provider Note   CSN: [id number] Arrival date & time: 08/24/24  1010     Patient presents with: Cough and Weakness   Lauryn P Ryant is a 89 y.o. female.    Cough Weakness Associated symptoms: cough    This patient is a 89 year old female, she is presenting today with a complaint of shortness of breath, she states that she has been coughing for 5 or 6 days, she does not feel like she has any fevers or chills, she is not nauseated or vomiting but has a continual cough of postnasal drip stuffy nose and she states that her right eye has been having some drainage and discharge she states it feels like, little kid with some crusting when I wake up in the morning.    Prior to Admission medications   Medication Sig Start Date End Date Taking? Authorizing Provider  amoxicillin -clavulanate (AUGMENTIN ) 875-125 MG tablet Take 1 tablet by mouth every 12 (twelve) hours. 08/24/24  Yes Cleotilde Rogue, MD  acetaminophen  (TYLENOL ) 500 MG tablet Take 500 mg by mouth every 8 (eight) hours as needed for mild pain.    [provider]  ALPRAZolam  (XANAX ) 0.5 MG tablet Take 0.5 mg by mouth at bedtime.    [provider]  bisacodyl  (DULCOLAX) 10 MG suppository Place 1 suppository (10 mg total) rectally daily as needed for moderate constipation. 11/30/23   Ricky Fines, MD  Cholecalciferol  (VITAMIN D3 PO) Take 1 tablet by mouth daily.    [provider]  clopidogrel  (PLAVIX ) 75 MG tablet Take 1 tablet (75 mg total) by mouth daily with breakfast. 07/22/24   Leverne Charlies Helling, PA-C  FEROSUL 325 (65 Fe) MG tablet TAKE ONE TABLET (325MG  TOTAL) BY MOUTH DAILY 07/17/24   Ezzard Sonny RAMAN, PA-C  glimepiride  (AMARYL ) 1 MG tablet Take 1 mg by mouth daily with breakfast.    [provider]  metoprolol  tartrate (LOPRESSOR ) 25 MG tablet Take 0.5 tablets (12.5 mg total) by mouth 2 (two) times daily. 05/07/20   Mikhail, Maryann, DO   Multiple Vitamin (MULTIVITAMIN) tablet Take 1 tablet by mouth daily.    [provider]  Multiple Vitamins-Minerals (PRESERVISION AREDS 2 PO) Take 1 tablet by mouth daily. Takes 1 tablet in the morning and 1 tablet at night for eyes.    [provider]  pantoprazole  (PROTONIX ) 40 MG tablet TAKE ONE TABLET (40 MG TOTAL) BY MOUTH ONCE DAILY. 08/22/23   Cindie Ole DASEN, MD  vitamin C (ASCORBIC ACID) 500 MG tablet Take 500 mg by mouth daily.    [provider]  zinc sulfate, 50mg  elemental zinc, 220 (50 Zn) MG capsule Take 220 mg by mouth daily.    [provider]    Allergies: Patient has no known allergies.    Review of Systems  Respiratory:  Positive for cough.   Neurological:  Positive for weakness.  All other systems reviewed and are negative.   Updated Vital Signs BP 117/60   Pulse 86   Temp 98 F (36.7 C) (Oral)   Resp 15   Ht 1.676 m (5' 6)   Wt 73.5 kg   SpO2 91%   BMI 26.15 kg/m   Physical Exam Vitals and nursing note reviewed.  Constitutional:      General: She is not in acute distress.    Appearance: She is well-developed.  HENT:     Head: Normocephalic and atraumatic.     Nose: Rhinorrhea present.  Mouth/Throat:     Mouth: Mucous membranes are dry.     Pharynx: No oropharyngeal exudate.     Comments: Some purulent drainage in the back of the posterior pharynx and the right nostril Eyes:     General: No scleral icterus.       Right eye: No discharge.        Left eye: No discharge.     Conjunctiva/sclera: Conjunctivae normal.     Pupils: Pupils are equal, round, and reactive to light.  Neck:     Thyroid : No thyromegaly.     Vascular: No JVD.  Cardiovascular:     Rate and Rhythm: Normal rate and regular rhythm.     Heart sounds: Normal heart sounds. No murmur heard.    No friction rub. No gallop.  Pulmonary:     Effort: Pulmonary effort is normal. No respiratory distress.     Breath sounds: Normal breath sounds.  No wheezing or rales.  Abdominal:     General: Bowel sounds are normal. There is no distension.     Palpations: Abdomen is soft. There is no mass.     Tenderness: There is no abdominal tenderness.  Musculoskeletal:        General: No tenderness. Normal range of motion.     Cervical back: Normal range of motion and neck supple.  Lymphadenopathy:     Cervical: No cervical adenopathy.  Skin:    General: Skin is warm and dry.     Findings: No erythema or rash.  Neurological:     Mental Status: She is alert.     Coordination: Coordination normal.  Psychiatric:        Behavior: Behavior normal.     (all labs ordered are listed, but only abnormal results are displayed) Labs Reviewed  COMPREHENSIVE METABOLIC PANEL WITH GFR - Abnormal; Notable for the following components:      Result Value   Sodium 134 (*)    Glucose, Bld 129 (*)    GFR, Estimated 60 (*)    All other components within normal limits  CBG MONITORING, ED - Abnormal; Notable for the following components:   Glucose-Capillary 134 (*)    All other components within normal limits  RESP PANEL BY RT-PCR (RSV, FLU A&B, COVID)  [id number]  CBC  CBG MONITORING, ED    EKG: EKG Interpretation Date/Time:  Sunday August 24 2024 10:20:40 EST Ventricular Rate:  102 PR Interval:  128 QRS Duration:  110 QT Interval:  385 QTC Calculation: 502 R Axis:   -61  Text Interpretation: Sinus or ectopic atrial tachycardia Inferior infarct, old Consider anterior infarct Prolonged QT interval Since last tracing rate faster Confirmed by Cleotilde Rogue ([id number]) on 08/24/2024 11:00:01 AM  Radiology: [facility] Chest Port 1 View Result Date: 08/24/2024 EXAM: 1 VIEW(S) XRAY OF THE CHEST 08/24/2024 10:48:07 AM COMPARISON: 11/26/2023 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 No pleural effusion. No pneumothorax. HEART AND MEDIASTINUM: Stable  left-sided pacemaker status post transcatheter aortic valve repair. BONES AND SOFT TISSUES: No acute osseous abnormality. IMPRESSION: 1. No acute cardiopulmonary process. Electronically signed by: Lynwood Seip MD 08/24/2024 11:03 AM EST RP Workstation: [id number]     Procedures   Medications Ordered in the ED  amoxicillin -clavulanate (AUGMENTIN ) 875-125 MG per tablet 1 tablet (has no administration in time range)  Medical Decision Making Amount and/or Complexity of Data Reviewed Labs: ordered. Radiology: ordered.  Risk Prescription drug management.   The patient's lung sounds are actually clear, her oxygen  is 94% on my exam, she is not tachycardic hypotensive or febrile.  Will check chest x-ray and labs to make sure there is no other signs of more worsening severe bacterial infection but the symptoms including the runny nose discharge from the right eye postnasal drip and a dry cough suggest that this is more likely to be viral.  Labs:  I  personally viewed and interpreted the labs which show neg viral testing, CBC and CMP   Radiology Imaging: I personally viewed the images of the ordered radiographic studies and find no pneumonai I agree with the radiologist interpretation as well  Meds / Interventions: while in the ED the patient received the following:  augmentin  The response to the interventions was that the patient stable for d/c.      Final diagnoses:  Acute recurrent maxillary sinusitis    ED Discharge Orders          Ordered    amoxicillin -clavulanate (AUGMENTIN ) 875-125 MG tablet  Every 12 hours        08/24/24 1116               Cleotilde Rogue, MD 08/24/24 1118

## 2024-10-08 ENCOUNTER — Ambulatory Visit: Payer: Medicare Other

## 2024-10-08 DIAGNOSIS — I442 Atrioventricular block, complete: Secondary | ICD-10-CM | POA: Diagnosis not present

## 2024-10-09 LAB — CUP PACEART REMOTE DEVICE CHECK
Battery Voltage: 75
Date Time Interrogation Session: 20260121073656
Implantable Lead Connection Status: 753985
Implantable Lead Connection Status: 753985
Implantable Lead Implant Date: 20221026
Implantable Lead Implant Date: 20221026
Implantable Lead Location: 753859
Implantable Lead Location: 753860
Implantable Lead Model: 377171
Implantable Lead Model: 377171
Implantable Lead Serial Number: 8000595070
Implantable Lead Serial Number: 8000606642
Implantable Pulse Generator Implant Date: 20221026
Pulse Gen Model: 407145
Pulse Gen Serial Number: 70223175

## 2024-10-11 NOTE — Progress Notes (Signed)
 Remote PPM Transmission

## 2024-10-19 ENCOUNTER — Ambulatory Visit: Payer: Self-pay | Admitting: Student in an Organized Health Care Education/Training Program

## 2024-10-21 ENCOUNTER — Other Ambulatory Visit: Payer: Self-pay | Admitting: Physician Assistant

## 2025-01-07 ENCOUNTER — Ambulatory Visit: Payer: Medicare Other
